# Patient Record
Sex: Female | Born: 1935 | Race: White | Hispanic: No | Smoking: Former smoker
Health system: Southern US, Community
[De-identification: ages and names within clinical notes are randomized; demographics above are authoritative.]

## PROBLEM LIST (undated history)

## (undated) DIAGNOSIS — I739 Peripheral vascular disease, unspecified: Secondary | ICD-10-CM

## (undated) DIAGNOSIS — H3530 Unspecified macular degeneration: Secondary | ICD-10-CM

## (undated) DIAGNOSIS — J449 Chronic obstructive pulmonary disease, unspecified: Secondary | ICD-10-CM

## (undated) DIAGNOSIS — N281 Cyst of kidney, acquired: Secondary | ICD-10-CM

## (undated) DIAGNOSIS — I350 Nonrheumatic aortic (valve) stenosis: Secondary | ICD-10-CM

## (undated) DIAGNOSIS — K589 Irritable bowel syndrome without diarrhea: Secondary | ICD-10-CM

## (undated) DIAGNOSIS — F419 Anxiety disorder, unspecified: Secondary | ICD-10-CM

## (undated) DIAGNOSIS — J329 Chronic sinusitis, unspecified: Secondary | ICD-10-CM

## (undated) DIAGNOSIS — F329 Major depressive disorder, single episode, unspecified: Secondary | ICD-10-CM

## (undated) DIAGNOSIS — R569 Unspecified convulsions: Secondary | ICD-10-CM

## (undated) DIAGNOSIS — E46 Unspecified protein-calorie malnutrition: Secondary | ICD-10-CM

## (undated) DIAGNOSIS — M719 Bursopathy, unspecified: Secondary | ICD-10-CM

## (undated) DIAGNOSIS — B370 Candidal stomatitis: Secondary | ICD-10-CM

## (undated) DIAGNOSIS — G459 Transient cerebral ischemic attack, unspecified: Secondary | ICD-10-CM

## (undated) DIAGNOSIS — R030 Elevated blood-pressure reading, without diagnosis of hypertension: Secondary | ICD-10-CM

## (undated) DIAGNOSIS — K219 Gastro-esophageal reflux disease without esophagitis: Secondary | ICD-10-CM

## (undated) DIAGNOSIS — C801 Malignant (primary) neoplasm, unspecified: Secondary | ICD-10-CM

## (undated) DIAGNOSIS — I209 Angina pectoris, unspecified: Secondary | ICD-10-CM

## (undated) DIAGNOSIS — E1159 Type 2 diabetes mellitus with other circulatory complications: Secondary | ICD-10-CM

## (undated) DIAGNOSIS — D696 Thrombocytopenia, unspecified: Secondary | ICD-10-CM

## (undated) DIAGNOSIS — K5790 Diverticulosis of intestine, part unspecified, without perforation or abscess without bleeding: Secondary | ICD-10-CM

## (undated) DIAGNOSIS — L0390 Cellulitis, unspecified: Secondary | ICD-10-CM

## (undated) DIAGNOSIS — R040 Epistaxis: Secondary | ICD-10-CM

## (undated) DIAGNOSIS — R7989 Other specified abnormal findings of blood chemistry: Secondary | ICD-10-CM

## (undated) DIAGNOSIS — R945 Abnormal results of liver function studies: Secondary | ICD-10-CM

## (undated) DIAGNOSIS — A09 Infectious gastroenteritis and colitis, unspecified: Secondary | ICD-10-CM

## (undated) DIAGNOSIS — M4800 Spinal stenosis, site unspecified: Secondary | ICD-10-CM

## (undated) DIAGNOSIS — J45909 Unspecified asthma, uncomplicated: Secondary | ICD-10-CM

## (undated) DIAGNOSIS — F32A Depression, unspecified: Secondary | ICD-10-CM

## (undated) DIAGNOSIS — G9341 Metabolic encephalopathy: Secondary | ICD-10-CM

## (undated) DIAGNOSIS — A0472 Enterocolitis due to Clostridium difficile, not specified as recurrent: Secondary | ICD-10-CM

## (undated) DIAGNOSIS — K648 Other hemorrhoids: Secondary | ICD-10-CM

## (undated) DIAGNOSIS — K7460 Unspecified cirrhosis of liver: Secondary | ICD-10-CM

## (undated) DIAGNOSIS — D649 Anemia, unspecified: Secondary | ICD-10-CM

## (undated) DIAGNOSIS — G4700 Insomnia, unspecified: Secondary | ICD-10-CM

## (undated) HISTORY — PX: TONSILLECTOMY: SUR1361

## (undated) HISTORY — DX: Thrombocytopenia, unspecified: D69.6

## (undated) HISTORY — DX: Candidal stomatitis: B37.0

## (undated) HISTORY — DX: Infectious gastroenteritis and colitis, unspecified: A09

## (undated) HISTORY — DX: Gastro-esophageal reflux disease without esophagitis: K21.9

## (undated) HISTORY — DX: Irritable bowel syndrome, unspecified: K58.9

## (undated) HISTORY — DX: Depression, unspecified: F32.A

## (undated) HISTORY — DX: Epistaxis: R04.0

## (undated) HISTORY — DX: Spinal stenosis, site unspecified: M48.00

## (undated) HISTORY — DX: Transient cerebral ischemic attack, unspecified: G45.9

## (undated) HISTORY — DX: Nonrheumatic aortic (valve) stenosis: I35.0

## (undated) HISTORY — DX: Malignant (primary) neoplasm, unspecified: C80.1

## (undated) HISTORY — DX: Diverticulosis of intestine, part unspecified, without perforation or abscess without bleeding: K57.90

## (undated) HISTORY — PX: APPENDECTOMY: SHX54

## (undated) HISTORY — DX: Unspecified macular degeneration: H35.30

## (undated) HISTORY — PX: UPPER GASTROINTESTINAL ENDOSCOPY: SHX188

## (undated) HISTORY — PX: CAROTID ENDARTERECTOMY: SUR193

## (undated) HISTORY — DX: Anxiety disorder, unspecified: F41.9

## (undated) HISTORY — DX: Other hemorrhoids: K64.8

## (undated) HISTORY — DX: Chronic obstructive pulmonary disease, unspecified: J44.9

## (undated) HISTORY — DX: Metabolic encephalopathy: G93.41

## (undated) HISTORY — DX: Bursopathy, unspecified: M71.9

## (undated) HISTORY — DX: Enterocolitis due to Clostridium difficile, not specified as recurrent: A04.72

## (undated) HISTORY — DX: Anemia, unspecified: D64.9

## (undated) HISTORY — DX: Unspecified protein-calorie malnutrition: E46

## (undated) HISTORY — DX: Abnormal results of liver function studies: R94.5

## (undated) HISTORY — DX: Other specified abnormal findings of blood chemistry: R79.89

## (undated) HISTORY — DX: Cyst of kidney, acquired: N28.1

## (undated) HISTORY — DX: Peripheral vascular disease, unspecified: I73.9

## (undated) HISTORY — DX: Chronic sinusitis, unspecified: J32.9

## (undated) HISTORY — DX: Type 2 diabetes mellitus with other circulatory complications: E11.59

## (undated) HISTORY — DX: Major depressive disorder, single episode, unspecified: F32.9

## (undated) HISTORY — DX: Cellulitis, unspecified: L03.90

## (undated) HISTORY — DX: Elevated blood-pressure reading, without diagnosis of hypertension: R03.0

## (undated) HISTORY — PX: COLECTOMY: SHX59

## (undated) HISTORY — DX: Insomnia, unspecified: G47.00

---

## 2002-08-21 ENCOUNTER — Observation Stay (HOSPITAL_COMMUNITY): Admission: EM | Admit: 2002-08-21 | Discharge: 2002-08-22 | Payer: Self-pay | Admitting: Emergency Medicine

## 2002-08-21 ENCOUNTER — Encounter: Payer: Self-pay | Admitting: Emergency Medicine

## 2003-02-28 ENCOUNTER — Encounter: Payer: Self-pay | Admitting: Internal Medicine

## 2003-03-15 ENCOUNTER — Encounter: Payer: Self-pay | Admitting: General Surgery

## 2003-03-22 ENCOUNTER — Encounter (INDEPENDENT_AMBULATORY_CARE_PROVIDER_SITE_OTHER): Payer: Self-pay | Admitting: *Deleted

## 2003-03-22 ENCOUNTER — Encounter: Payer: Self-pay | Admitting: General Surgery

## 2003-03-22 ENCOUNTER — Observation Stay (HOSPITAL_COMMUNITY): Admission: RE | Admit: 2003-03-22 | Discharge: 2003-03-23 | Payer: Self-pay | Admitting: General Surgery

## 2004-01-18 ENCOUNTER — Encounter: Admission: RE | Admit: 2004-01-18 | Discharge: 2004-01-18 | Payer: Self-pay | Admitting: Orthopaedic Surgery

## 2004-03-20 ENCOUNTER — Emergency Department (HOSPITAL_COMMUNITY): Admission: EM | Admit: 2004-03-20 | Discharge: 2004-03-20 | Payer: Self-pay | Admitting: Emergency Medicine

## 2004-07-04 ENCOUNTER — Ambulatory Visit (HOSPITAL_COMMUNITY): Admission: RE | Admit: 2004-07-04 | Discharge: 2004-07-04 | Payer: Self-pay | Admitting: Cardiology

## 2004-08-15 ENCOUNTER — Encounter: Admission: RE | Admit: 2004-08-15 | Discharge: 2004-08-15 | Payer: Self-pay | Admitting: Neurology

## 2004-09-08 ENCOUNTER — Ambulatory Visit: Payer: Self-pay | Admitting: Internal Medicine

## 2004-10-10 ENCOUNTER — Ambulatory Visit: Payer: Self-pay | Admitting: Internal Medicine

## 2004-10-29 ENCOUNTER — Ambulatory Visit: Payer: Self-pay | Admitting: Internal Medicine

## 2004-11-04 ENCOUNTER — Ambulatory Visit (HOSPITAL_COMMUNITY): Admission: RE | Admit: 2004-11-04 | Discharge: 2004-11-04 | Payer: Self-pay | Admitting: *Deleted

## 2004-11-25 ENCOUNTER — Ambulatory Visit: Payer: Self-pay | Admitting: Internal Medicine

## 2004-12-05 ENCOUNTER — Ambulatory Visit: Payer: Self-pay | Admitting: Internal Medicine

## 2004-12-26 ENCOUNTER — Ambulatory Visit: Payer: Self-pay | Admitting: Internal Medicine

## 2005-01-09 ENCOUNTER — Ambulatory Visit: Payer: Self-pay | Admitting: Licensed Clinical Social Worker

## 2005-01-23 ENCOUNTER — Ambulatory Visit: Payer: Self-pay | Admitting: Licensed Clinical Social Worker

## 2005-02-05 ENCOUNTER — Ambulatory Visit: Payer: Self-pay | Admitting: Internal Medicine

## 2005-02-09 ENCOUNTER — Ambulatory Visit: Payer: Self-pay | Admitting: Internal Medicine

## 2005-02-23 ENCOUNTER — Ambulatory Visit: Payer: Self-pay | Admitting: Internal Medicine

## 2005-03-03 ENCOUNTER — Emergency Department (HOSPITAL_COMMUNITY): Admission: EM | Admit: 2005-03-03 | Discharge: 2005-03-03 | Payer: Self-pay | Admitting: Emergency Medicine

## 2005-03-03 ENCOUNTER — Ambulatory Visit: Payer: Self-pay | Admitting: Internal Medicine

## 2005-03-13 ENCOUNTER — Ambulatory Visit: Payer: Self-pay | Admitting: Internal Medicine

## 2005-03-23 ENCOUNTER — Ambulatory Visit: Payer: Self-pay | Admitting: Internal Medicine

## 2005-04-15 ENCOUNTER — Ambulatory Visit: Payer: Self-pay | Admitting: Internal Medicine

## 2005-05-06 ENCOUNTER — Ambulatory Visit: Payer: Self-pay | Admitting: Internal Medicine

## 2005-05-11 ENCOUNTER — Ambulatory Visit: Payer: Self-pay | Admitting: Internal Medicine

## 2005-05-20 ENCOUNTER — Ambulatory Visit (HOSPITAL_COMMUNITY): Admission: RE | Admit: 2005-05-20 | Discharge: 2005-05-20 | Payer: Self-pay | Admitting: Internal Medicine

## 2005-05-20 ENCOUNTER — Ambulatory Visit: Payer: Self-pay | Admitting: Internal Medicine

## 2005-06-09 ENCOUNTER — Ambulatory Visit: Payer: Self-pay | Admitting: Internal Medicine

## 2005-06-10 ENCOUNTER — Ambulatory Visit: Payer: Self-pay

## 2005-06-10 ENCOUNTER — Ambulatory Visit: Payer: Self-pay | Admitting: Internal Medicine

## 2005-06-18 ENCOUNTER — Inpatient Hospital Stay (HOSPITAL_COMMUNITY): Admission: RE | Admit: 2005-06-18 | Discharge: 2005-06-19 | Payer: Self-pay | Admitting: *Deleted

## 2005-06-18 ENCOUNTER — Encounter (INDEPENDENT_AMBULATORY_CARE_PROVIDER_SITE_OTHER): Payer: Self-pay | Admitting: *Deleted

## 2005-07-04 ENCOUNTER — Ambulatory Visit: Payer: Self-pay | Admitting: Family Medicine

## 2005-07-06 ENCOUNTER — Emergency Department (HOSPITAL_COMMUNITY): Admission: EM | Admit: 2005-07-06 | Discharge: 2005-07-06 | Payer: Self-pay | Admitting: Emergency Medicine

## 2005-07-14 ENCOUNTER — Ambulatory Visit: Payer: Self-pay | Admitting: Internal Medicine

## 2005-10-01 ENCOUNTER — Ambulatory Visit: Payer: Self-pay | Admitting: Internal Medicine

## 2005-10-09 ENCOUNTER — Ambulatory Visit: Payer: Self-pay | Admitting: Internal Medicine

## 2005-10-12 ENCOUNTER — Ambulatory Visit: Payer: Self-pay | Admitting: Internal Medicine

## 2005-10-26 ENCOUNTER — Ambulatory Visit: Payer: Self-pay | Admitting: Internal Medicine

## 2005-11-06 ENCOUNTER — Emergency Department (HOSPITAL_COMMUNITY): Admission: EM | Admit: 2005-11-06 | Discharge: 2005-11-06 | Payer: Self-pay | Admitting: Emergency Medicine

## 2005-11-18 ENCOUNTER — Ambulatory Visit: Payer: Self-pay | Admitting: Internal Medicine

## 2005-11-19 ENCOUNTER — Ambulatory Visit: Payer: Self-pay | Admitting: Internal Medicine

## 2005-11-25 ENCOUNTER — Ambulatory Visit: Payer: Self-pay | Admitting: Internal Medicine

## 2005-12-22 ENCOUNTER — Ambulatory Visit: Payer: Self-pay | Admitting: Internal Medicine

## 2006-01-07 ENCOUNTER — Ambulatory Visit (HOSPITAL_COMMUNITY): Admission: RE | Admit: 2006-01-07 | Discharge: 2006-01-07 | Payer: Self-pay | Admitting: Gastroenterology

## 2006-01-07 ENCOUNTER — Encounter: Payer: Self-pay | Admitting: Gastroenterology

## 2006-01-11 ENCOUNTER — Ambulatory Visit: Payer: Self-pay | Admitting: Gastroenterology

## 2006-01-27 ENCOUNTER — Ambulatory Visit: Payer: Self-pay | Admitting: Internal Medicine

## 2006-03-15 ENCOUNTER — Ambulatory Visit: Payer: Self-pay | Admitting: Internal Medicine

## 2006-03-18 ENCOUNTER — Ambulatory Visit (HOSPITAL_BASED_OUTPATIENT_CLINIC_OR_DEPARTMENT_OTHER): Admission: RE | Admit: 2006-03-18 | Discharge: 2006-03-18 | Payer: Self-pay | Admitting: Internal Medicine

## 2006-03-21 ENCOUNTER — Ambulatory Visit: Payer: Self-pay | Admitting: Internal Medicine

## 2006-03-31 ENCOUNTER — Ambulatory Visit: Payer: Self-pay | Admitting: Internal Medicine

## 2006-04-02 ENCOUNTER — Ambulatory Visit: Payer: Self-pay | Admitting: Internal Medicine

## 2006-04-22 ENCOUNTER — Ambulatory Visit (HOSPITAL_COMMUNITY): Admission: RE | Admit: 2006-04-22 | Discharge: 2006-04-22 | Payer: Self-pay | Admitting: Gastroenterology

## 2006-04-22 ENCOUNTER — Encounter: Payer: Self-pay | Admitting: Internal Medicine

## 2006-04-22 ENCOUNTER — Encounter: Payer: Self-pay | Admitting: Gastroenterology

## 2006-04-27 ENCOUNTER — Ambulatory Visit: Payer: Self-pay | Admitting: Gastroenterology

## 2006-05-11 ENCOUNTER — Ambulatory Visit: Payer: Self-pay | Admitting: Internal Medicine

## 2006-05-21 ENCOUNTER — Ambulatory Visit: Payer: Self-pay | Admitting: Internal Medicine

## 2006-06-03 ENCOUNTER — Ambulatory Visit: Payer: Self-pay | Admitting: Internal Medicine

## 2006-06-11 ENCOUNTER — Ambulatory Visit: Payer: Self-pay | Admitting: Internal Medicine

## 2006-06-21 ENCOUNTER — Ambulatory Visit: Payer: Self-pay | Admitting: Internal Medicine

## 2006-06-27 ENCOUNTER — Emergency Department (HOSPITAL_COMMUNITY): Admission: EM | Admit: 2006-06-27 | Discharge: 2006-06-27 | Payer: Self-pay | Admitting: Emergency Medicine

## 2006-06-28 ENCOUNTER — Ambulatory Visit: Payer: Self-pay | Admitting: Internal Medicine

## 2006-07-07 ENCOUNTER — Ambulatory Visit: Payer: Self-pay | Admitting: Internal Medicine

## 2006-07-13 ENCOUNTER — Ambulatory Visit: Payer: Self-pay | Admitting: Internal Medicine

## 2006-07-21 ENCOUNTER — Ambulatory Visit: Payer: Self-pay | Admitting: Internal Medicine

## 2006-07-30 ENCOUNTER — Ambulatory Visit: Payer: Self-pay | Admitting: Internal Medicine

## 2006-08-30 ENCOUNTER — Ambulatory Visit: Payer: Self-pay | Admitting: Internal Medicine

## 2006-09-03 ENCOUNTER — Ambulatory Visit: Payer: Self-pay | Admitting: Internal Medicine

## 2006-09-11 ENCOUNTER — Ambulatory Visit: Payer: Self-pay | Admitting: Family Medicine

## 2006-09-13 ENCOUNTER — Ambulatory Visit: Payer: Self-pay | Admitting: Internal Medicine

## 2006-09-17 ENCOUNTER — Ambulatory Visit: Payer: Self-pay | Admitting: Internal Medicine

## 2006-10-01 ENCOUNTER — Ambulatory Visit: Payer: Self-pay | Admitting: Internal Medicine

## 2006-10-05 DIAGNOSIS — C801 Malignant (primary) neoplasm, unspecified: Secondary | ICD-10-CM

## 2006-10-05 HISTORY — DX: Malignant (primary) neoplasm, unspecified: C80.1

## 2006-10-20 ENCOUNTER — Ambulatory Visit: Payer: Self-pay | Admitting: Internal Medicine

## 2006-10-20 ENCOUNTER — Inpatient Hospital Stay (HOSPITAL_COMMUNITY): Admission: EM | Admit: 2006-10-20 | Discharge: 2006-10-20 | Payer: Self-pay | Admitting: Emergency Medicine

## 2006-10-20 ENCOUNTER — Encounter: Payer: Self-pay | Admitting: Cardiology

## 2006-10-25 ENCOUNTER — Ambulatory Visit: Payer: Self-pay | Admitting: Internal Medicine

## 2006-11-24 ENCOUNTER — Ambulatory Visit: Payer: Self-pay | Admitting: Internal Medicine

## 2006-11-24 LAB — CONVERTED CEMR LAB
Basophils Absolute: 0.1 10*3/uL (ref 0.0–0.1)
Eosinophils Relative: 2.7 % (ref 0.0–5.0)
Glucose, Bld: 159 mg/dL — ABNORMAL HIGH (ref 70–99)
HCT: 29.4 % — ABNORMAL LOW (ref 36.0–46.0)
Hemoglobin: 9.5 g/dL — ABNORMAL LOW (ref 12.0–15.0)
Hgb A1c MFr Bld: 7.8 % — ABNORMAL HIGH (ref 4.6–6.0)
MCHC: 32.3 g/dL (ref 30.0–36.0)
MCV: 67 fL — ABNORMAL LOW (ref 78.0–100.0)
Monocytes Absolute: 0.7 10*3/uL (ref 0.2–0.7)
Neutrophils Relative %: 60.4 % (ref 43.0–77.0)
Potassium: 4.4 meq/L (ref 3.5–5.1)
RBC: 4.38 M/uL (ref 3.87–5.11)
RDW: 18.6 % — ABNORMAL HIGH (ref 11.5–14.6)
Saturation Ratios: 5.4 % — ABNORMAL LOW (ref 20.0–50.0)
Sodium: 141 meq/L (ref 135–145)
WBC: 8.7 10*3/uL (ref 4.5–10.5)

## 2006-11-26 ENCOUNTER — Ambulatory Visit: Payer: Self-pay | Admitting: Internal Medicine

## 2006-12-03 ENCOUNTER — Ambulatory Visit: Payer: Self-pay | Admitting: Hematology & Oncology

## 2006-12-15 ENCOUNTER — Ambulatory Visit: Payer: Self-pay | Admitting: Internal Medicine

## 2006-12-23 LAB — CBC & DIFF AND RETIC
BASO%: 0.6 % (ref 0.0–2.0)
EOS%: 1.2 % (ref 0.0–7.0)
IRF: 0.48 — ABNORMAL HIGH (ref 0.130–0.330)
MCH: 21.2 pg — ABNORMAL LOW (ref 26.0–34.0)
MCHC: 32.3 g/dL (ref 32.0–36.0)
MONO#: 0.7 10*3/uL (ref 0.1–0.9)
RBC: 4.48 10*6/uL (ref 3.70–5.32)
RDW: 18 % — ABNORMAL HIGH (ref 11.3–14.5)
Retic %: 1.7 % (ref 0.4–2.3)
WBC: 8 10*3/uL (ref 3.9–10.0)
lymph#: 2.2 10*3/uL (ref 0.9–3.3)

## 2006-12-25 LAB — FERRITIN: Ferritin: 13 ng/mL (ref 10–291)

## 2007-01-04 ENCOUNTER — Ambulatory Visit: Payer: Self-pay | Admitting: Internal Medicine

## 2007-01-12 ENCOUNTER — Encounter (INDEPENDENT_AMBULATORY_CARE_PROVIDER_SITE_OTHER): Payer: Self-pay | Admitting: Specialist

## 2007-01-12 ENCOUNTER — Ambulatory Visit: Payer: Self-pay | Admitting: Internal Medicine

## 2007-01-17 ENCOUNTER — Ambulatory Visit: Payer: Self-pay | Admitting: Internal Medicine

## 2007-01-18 ENCOUNTER — Ambulatory Visit: Payer: Self-pay | Admitting: Hematology & Oncology

## 2007-01-21 ENCOUNTER — Ambulatory Visit (HOSPITAL_COMMUNITY): Admission: RE | Admit: 2007-01-21 | Discharge: 2007-01-21 | Payer: Self-pay | Admitting: Hematology & Oncology

## 2007-01-21 LAB — COMPREHENSIVE METABOLIC PANEL
AST: 70 U/L — ABNORMAL HIGH (ref 0–37)
Albumin: 3.5 g/dL (ref 3.5–5.2)
Alkaline Phosphatase: 76 U/L (ref 39–117)
BUN: 6 mg/dL (ref 6–23)
Calcium: 8.8 mg/dL (ref 8.4–10.5)
Chloride: 102 mEq/L (ref 96–112)
Glucose, Bld: 117 mg/dL — ABNORMAL HIGH (ref 70–99)
Potassium: 3.8 mEq/L (ref 3.5–5.3)
Sodium: 137 mEq/L (ref 135–145)
Total Protein: 6.8 g/dL (ref 6.0–8.3)

## 2007-01-21 LAB — CBC WITH DIFFERENTIAL/PLATELET
Basophils Absolute: 0 10*3/uL (ref 0.0–0.1)
EOS%: 1.6 % (ref 0.0–7.0)
HCT: 36 % (ref 34.8–46.6)
HGB: 12 g/dL (ref 11.6–15.9)
LYMPH%: 32.9 % (ref 14.0–48.0)
MCH: 24.8 pg — ABNORMAL LOW (ref 26.0–34.0)
MCV: 74.6 fL — ABNORMAL LOW (ref 81.0–101.0)
MONO%: 9.7 % (ref 0.0–13.0)
NEUT%: 55.3 % (ref 39.6–76.8)

## 2007-02-10 ENCOUNTER — Inpatient Hospital Stay (HOSPITAL_COMMUNITY): Admission: RE | Admit: 2007-02-10 | Discharge: 2007-02-14 | Payer: Self-pay | Admitting: Surgery

## 2007-02-10 ENCOUNTER — Encounter (INDEPENDENT_AMBULATORY_CARE_PROVIDER_SITE_OTHER): Payer: Self-pay | Admitting: *Deleted

## 2007-02-11 ENCOUNTER — Encounter (INDEPENDENT_AMBULATORY_CARE_PROVIDER_SITE_OTHER): Payer: Self-pay | Admitting: Specialist

## 2007-02-14 ENCOUNTER — Encounter (INDEPENDENT_AMBULATORY_CARE_PROVIDER_SITE_OTHER): Payer: Self-pay | Admitting: *Deleted

## 2007-03-02 ENCOUNTER — Encounter: Payer: Self-pay | Admitting: Endocrinology

## 2007-03-02 DIAGNOSIS — Z8679 Personal history of other diseases of the circulatory system: Secondary | ICD-10-CM | POA: Insufficient documentation

## 2007-03-02 DIAGNOSIS — N281 Cyst of kidney, acquired: Secondary | ICD-10-CM

## 2007-03-02 DIAGNOSIS — F329 Major depressive disorder, single episode, unspecified: Secondary | ICD-10-CM

## 2007-03-02 DIAGNOSIS — F411 Generalized anxiety disorder: Secondary | ICD-10-CM | POA: Insufficient documentation

## 2007-03-02 DIAGNOSIS — C189 Malignant neoplasm of colon, unspecified: Secondary | ICD-10-CM | POA: Insufficient documentation

## 2007-03-02 DIAGNOSIS — I739 Peripheral vascular disease, unspecified: Secondary | ICD-10-CM | POA: Insufficient documentation

## 2007-03-02 DIAGNOSIS — E1159 Type 2 diabetes mellitus with other circulatory complications: Secondary | ICD-10-CM | POA: Insufficient documentation

## 2007-03-02 HISTORY — DX: Type 2 diabetes mellitus with other circulatory complications: E11.59

## 2007-03-04 ENCOUNTER — Ambulatory Visit: Payer: Self-pay | Admitting: Hematology & Oncology

## 2007-03-04 LAB — COMPREHENSIVE METABOLIC PANEL
ALT: 38 U/L — ABNORMAL HIGH (ref 0–35)
AST: 74 U/L — ABNORMAL HIGH (ref 0–37)
Albumin: 4.3 g/dL (ref 3.5–5.2)
Alkaline Phosphatase: 87 U/L (ref 39–117)
Calcium: 9.5 mg/dL (ref 8.4–10.5)
Chloride: 102 mEq/L (ref 96–112)
Potassium: 4.1 mEq/L (ref 3.5–5.3)
Sodium: 138 mEq/L (ref 135–145)
Total Protein: 7.7 g/dL (ref 6.0–8.3)

## 2007-03-04 LAB — CBC WITH DIFFERENTIAL/PLATELET
BASO%: 1.2 % (ref 0.0–2.0)
EOS%: 3.4 % (ref 0.0–7.0)
HGB: 13 g/dL (ref 11.6–15.9)
MCH: 27.8 pg (ref 26.0–34.0)
MCV: 81.9 fL (ref 81.0–101.0)
MONO%: 7.3 % (ref 0.0–13.0)
RBC: 4.68 10*6/uL (ref 3.70–5.32)
RDW: 16.8 % — ABNORMAL HIGH (ref 11.3–14.5)
lymph#: 2.2 10*3/uL (ref 0.9–3.3)

## 2007-03-21 ENCOUNTER — Ambulatory Visit: Payer: Self-pay | Admitting: Internal Medicine

## 2007-04-29 ENCOUNTER — Ambulatory Visit: Payer: Self-pay | Admitting: Internal Medicine

## 2007-04-29 LAB — CONVERTED CEMR LAB
ALT: 52 units/L — ABNORMAL HIGH (ref 0–35)
Albumin: 3.5 g/dL (ref 3.5–5.2)
Basophils Absolute: 0.1 10*3/uL (ref 0.0–0.1)
Bilirubin, Direct: 0.1 mg/dL (ref 0.0–0.3)
Calcium: 9.3 mg/dL (ref 8.4–10.5)
Chloride: 102 meq/L (ref 96–112)
Eosinophils Absolute: 0.2 10*3/uL (ref 0.0–0.6)
GFR calc Af Amer: 105 mL/min
GFR calc non Af Amer: 87 mL/min
Glucose, Bld: 131 mg/dL — ABNORMAL HIGH (ref 70–99)
HCT: 40.9 % (ref 36.0–46.0)
Ketones, ur: NEGATIVE mg/dL
Leukocytes, UA: NEGATIVE
MCHC: 34.3 g/dL (ref 30.0–36.0)
MCV: 81.9 fL (ref 78.0–100.0)
Monocytes Relative: 8 % (ref 3.0–11.0)
Platelets: 257 10*3/uL (ref 150–400)
RBC: 4.99 M/uL (ref 3.87–5.11)
RDW: 13 % (ref 11.5–14.6)
Sodium: 140 meq/L (ref 135–145)
Specific Gravity, Urine: 1.015 (ref 1.000–1.03)
Total Protein, Urine: 30 mg/dL — AB
Vitamin B-12: 374 pg/mL (ref 211–911)
pH: 6 (ref 5.0–8.0)

## 2007-05-20 ENCOUNTER — Ambulatory Visit (HOSPITAL_COMMUNITY): Admission: RE | Admit: 2007-05-20 | Discharge: 2007-05-20 | Payer: Self-pay | Admitting: Hematology & Oncology

## 2007-05-25 ENCOUNTER — Ambulatory Visit: Payer: Self-pay | Admitting: Internal Medicine

## 2007-05-25 LAB — CONVERTED CEMR LAB
ALT: 53 units/L — ABNORMAL HIGH (ref 0–35)
AST: 86 units/L — ABNORMAL HIGH (ref 0–37)
Basophils Relative: 0.9 % (ref 0.0–1.0)
Bilirubin, Direct: 0.1 mg/dL (ref 0.0–0.3)
CO2: 30 meq/L (ref 19–32)
Calcium: 9.8 mg/dL (ref 8.4–10.5)
Chloride: 99 meq/L (ref 96–112)
Creatinine, Ser: 0.7 mg/dL (ref 0.4–1.2)
Eosinophils Relative: 2.2 % (ref 0.0–5.0)
GFR calc non Af Amer: 87 mL/min
Glucose, Bld: 219 mg/dL — ABNORMAL HIGH (ref 70–99)
HCT: 42.8 % (ref 36.0–46.0)
Hep B Core Total Ab: NEGATIVE
Hep B S Ab: NEGATIVE
Hepatitis B Surface Ag: NEGATIVE
Ketones, ur: NEGATIVE mg/dL
Nitrite: NEGATIVE
Platelets: 276 10*3/uL (ref 150–400)
RBC: 5.26 M/uL — ABNORMAL HIGH (ref 3.87–5.11)
RDW: 13.4 % (ref 11.5–14.6)
Specific Gravity, Urine: 1.02 (ref 1.000–1.03)
Total Bilirubin: 0.7 mg/dL (ref 0.3–1.2)
Total Protein, Urine: 100 mg/dL — AB
Urine Glucose: NEGATIVE mg/dL
Urobilinogen, UA: 0.2 (ref 0.0–1.0)
WBC: 10.4 10*3/uL (ref 4.5–10.5)

## 2007-06-01 ENCOUNTER — Ambulatory Visit: Payer: Self-pay | Admitting: Hematology & Oncology

## 2007-06-03 LAB — CBC WITH DIFFERENTIAL/PLATELET
Basophils Absolute: 0 10*3/uL (ref 0.0–0.1)
Eosinophils Absolute: 0.2 10*3/uL (ref 0.0–0.5)
HCT: 39.8 % (ref 34.8–46.6)
HGB: 14.2 g/dL (ref 11.6–15.9)
MCV: 81 fL (ref 81.0–101.0)
NEUT#: 5.5 10*3/uL (ref 1.5–6.5)
RDW: 14.9 % — ABNORMAL HIGH (ref 11.3–14.5)
lymph#: 2.4 10*3/uL (ref 0.9–3.3)

## 2007-06-03 LAB — COMPREHENSIVE METABOLIC PANEL
Albumin: 4 g/dL (ref 3.5–5.2)
BUN: 8 mg/dL (ref 6–23)
CO2: 20 mEq/L (ref 19–32)
Calcium: 8.8 mg/dL (ref 8.4–10.5)
Chloride: 102 mEq/L (ref 96–112)
Glucose, Bld: 315 mg/dL — ABNORMAL HIGH (ref 70–99)
Potassium: 3.9 mEq/L (ref 3.5–5.3)

## 2007-06-03 LAB — CEA: CEA: 4.6 ng/mL (ref 0.0–5.0)

## 2007-06-07 ENCOUNTER — Ambulatory Visit: Payer: Self-pay | Admitting: Internal Medicine

## 2007-06-17 ENCOUNTER — Ambulatory Visit: Payer: Self-pay | Admitting: Internal Medicine

## 2007-06-17 LAB — CONVERTED CEMR LAB
Aldolase: 11.2 units/L — ABNORMAL HIGH (ref <=8.1)
Total CK: 85 units/L (ref 7–177)

## 2007-07-18 ENCOUNTER — Ambulatory Visit: Payer: Self-pay | Admitting: Internal Medicine

## 2007-08-01 ENCOUNTER — Ambulatory Visit: Payer: Self-pay | Admitting: Hematology & Oncology

## 2007-08-03 ENCOUNTER — Encounter: Payer: Self-pay | Admitting: Internal Medicine

## 2007-08-03 LAB — COMPREHENSIVE METABOLIC PANEL
Albumin: 4.3 g/dL (ref 3.5–5.2)
Alkaline Phosphatase: 82 U/L (ref 39–117)
Glucose, Bld: 203 mg/dL — ABNORMAL HIGH (ref 70–99)
Potassium: 4.4 mEq/L (ref 3.5–5.3)
Sodium: 134 mEq/L — ABNORMAL LOW (ref 135–145)
Total Protein: 8 g/dL (ref 6.0–8.3)

## 2007-08-03 LAB — CBC WITH DIFFERENTIAL/PLATELET
Eosinophils Absolute: 0.2 10*3/uL (ref 0.0–0.5)
MCV: 81.3 fL (ref 81.0–101.0)
MONO#: 0.6 10*3/uL (ref 0.1–0.9)
MONO%: 6.7 % (ref 0.0–13.0)
NEUT#: 5.5 10*3/uL (ref 1.5–6.5)
RBC: 5.02 10*6/uL (ref 3.70–5.32)
RDW: 14 % (ref 11.3–14.5)
WBC: 8.6 10*3/uL (ref 3.9–10.0)

## 2007-09-21 ENCOUNTER — Encounter (INDEPENDENT_AMBULATORY_CARE_PROVIDER_SITE_OTHER): Payer: Self-pay | Admitting: *Deleted

## 2007-09-21 ENCOUNTER — Ambulatory Visit (HOSPITAL_COMMUNITY): Admission: RE | Admit: 2007-09-21 | Discharge: 2007-09-21 | Payer: Self-pay | Admitting: Hematology & Oncology

## 2007-09-22 ENCOUNTER — Ambulatory Visit: Payer: Self-pay | Admitting: Hematology & Oncology

## 2007-10-05 DIAGNOSIS — I7090 Unspecified atherosclerosis: Secondary | ICD-10-CM | POA: Insufficient documentation

## 2007-10-05 DIAGNOSIS — J40 Bronchitis, not specified as acute or chronic: Secondary | ICD-10-CM

## 2007-10-05 DIAGNOSIS — G4769 Other sleep related movement disorders: Secondary | ICD-10-CM | POA: Insufficient documentation

## 2007-10-05 DIAGNOSIS — G4700 Insomnia, unspecified: Secondary | ICD-10-CM | POA: Insufficient documentation

## 2007-10-05 DIAGNOSIS — F17200 Nicotine dependence, unspecified, uncomplicated: Secondary | ICD-10-CM

## 2007-10-07 ENCOUNTER — Ambulatory Visit: Payer: Self-pay | Admitting: Internal Medicine

## 2007-10-12 ENCOUNTER — Telehealth: Payer: Self-pay | Admitting: Internal Medicine

## 2007-10-18 ENCOUNTER — Ambulatory Visit: Payer: Self-pay | Admitting: Internal Medicine

## 2007-10-26 LAB — CBC WITH DIFFERENTIAL/PLATELET
BASO%: 0 % (ref 0.0–2.0)
EOS%: 1.5 % (ref 0.0–7.0)
Eosinophils Absolute: 0.1 10*3/uL (ref 0.0–0.5)
MCH: 28.4 pg (ref 26.0–34.0)
MCHC: 34.2 g/dL (ref 32.0–36.0)
MCV: 83 fL (ref 81.0–101.0)
MONO%: 8.1 % (ref 0.0–13.0)
NEUT#: 5.4 10*3/uL (ref 1.5–6.5)
RBC: 5.09 10*6/uL (ref 3.70–5.32)
RDW: 14.5 % (ref 11.3–14.5)

## 2007-10-26 LAB — CEA: CEA: 4.4 ng/mL (ref 0.0–5.0)

## 2007-10-26 LAB — COMPREHENSIVE METABOLIC PANEL
Albumin: 4.1 g/dL (ref 3.5–5.2)
BUN: 7 mg/dL (ref 6–23)
CO2: 24 mEq/L (ref 19–32)
Calcium: 9 mg/dL (ref 8.4–10.5)
Chloride: 102 mEq/L (ref 96–112)
Creatinine, Ser: 0.68 mg/dL (ref 0.40–1.20)

## 2007-11-23 ENCOUNTER — Telehealth: Payer: Self-pay | Admitting: Internal Medicine

## 2007-12-07 DIAGNOSIS — K589 Irritable bowel syndrome without diarrhea: Secondary | ICD-10-CM

## 2008-01-09 ENCOUNTER — Ambulatory Visit: Payer: Self-pay | Admitting: Internal Medicine

## 2008-01-17 ENCOUNTER — Ambulatory Visit: Payer: Self-pay | Admitting: Internal Medicine

## 2008-01-17 DIAGNOSIS — M25519 Pain in unspecified shoulder: Secondary | ICD-10-CM | POA: Insufficient documentation

## 2008-01-25 ENCOUNTER — Ambulatory Visit: Payer: Self-pay | Admitting: Hematology & Oncology

## 2008-01-27 ENCOUNTER — Ambulatory Visit (HOSPITAL_COMMUNITY): Admission: RE | Admit: 2008-01-27 | Discharge: 2008-01-27 | Payer: Self-pay | Admitting: Hematology & Oncology

## 2008-01-27 ENCOUNTER — Encounter (INDEPENDENT_AMBULATORY_CARE_PROVIDER_SITE_OTHER): Payer: Self-pay | Admitting: *Deleted

## 2008-01-27 LAB — COMPREHENSIVE METABOLIC PANEL WITH GFR
ALT: 49 U/L — ABNORMAL HIGH (ref 0–35)
AST: 151 U/L — ABNORMAL HIGH (ref 0–37)
Albumin: 3.6 g/dL (ref 3.5–5.2)
Alkaline Phosphatase: 81 U/L (ref 39–117)
BUN: 6 mg/dL (ref 6–23)
CO2: 28 meq/L (ref 19–32)
Calcium: 9.7 mg/dL (ref 8.4–10.5)
Chloride: 101 meq/L (ref 96–112)
Creatinine, Ser: 0.71 mg/dL (ref 0.40–1.20)
Glucose, Bld: 91 mg/dL (ref 70–99)
Potassium: 3.9 meq/L (ref 3.5–5.3)
Sodium: 138 meq/L (ref 135–145)
Total Bilirubin: 0.8 mg/dL (ref 0.3–1.2)
Total Protein: 8.9 g/dL — ABNORMAL HIGH (ref 6.0–8.3)

## 2008-01-27 LAB — CBC WITH DIFFERENTIAL/PLATELET
BASO%: 0.7 % (ref 0.0–2.0)
EOS%: 1.2 % (ref 0.0–7.0)
HGB: 14.4 g/dL (ref 11.6–15.9)
MCH: 28.6 pg (ref 26.0–34.0)
MCHC: 34.6 g/dL (ref 32.0–36.0)
RBC: 5.06 10*6/uL (ref 3.70–5.32)
RDW: 14.1 % (ref 11.3–14.5)
lymph#: 3.6 10*3/uL — ABNORMAL HIGH (ref 0.9–3.3)

## 2008-01-27 LAB — CEA: CEA: 5.6 ng/mL — ABNORMAL HIGH (ref 0.0–5.0)

## 2008-02-01 ENCOUNTER — Encounter: Payer: Self-pay | Admitting: Internal Medicine

## 2008-02-01 LAB — CBC WITH DIFFERENTIAL/PLATELET
BASO%: 0.1 % (ref 0.0–2.0)
HCT: 39.3 % (ref 34.8–46.6)
MCHC: 34.7 g/dL (ref 32.0–36.0)
MONO#: 0.7 10*3/uL (ref 0.1–0.9)
NEUT%: 66.6 % (ref 39.6–76.8)
RDW: 14.5 % (ref 11.3–14.5)
WBC: 10.1 10*3/uL — ABNORMAL HIGH (ref 3.9–10.0)
lymph#: 2.5 10*3/uL (ref 0.9–3.3)

## 2008-02-01 LAB — COMPREHENSIVE METABOLIC PANEL
ALT: 31 U/L (ref 0–35)
Albumin: 3.9 g/dL (ref 3.5–5.2)
CO2: 23 mEq/L (ref 19–32)
Calcium: 9.4 mg/dL (ref 8.4–10.5)
Chloride: 102 mEq/L (ref 96–112)
Creatinine, Ser: 0.73 mg/dL (ref 0.40–1.20)
Potassium: 4 mEq/L (ref 3.5–5.3)
Sodium: 139 mEq/L (ref 135–145)
Total Protein: 8 g/dL (ref 6.0–8.3)

## 2008-02-09 ENCOUNTER — Ambulatory Visit: Payer: Self-pay | Admitting: Internal Medicine

## 2008-02-24 ENCOUNTER — Telehealth: Payer: Self-pay | Admitting: Internal Medicine

## 2008-03-07 ENCOUNTER — Ambulatory Visit: Payer: Self-pay | Admitting: Internal Medicine

## 2008-03-07 DIAGNOSIS — K219 Gastro-esophageal reflux disease without esophagitis: Secondary | ICD-10-CM

## 2008-03-12 ENCOUNTER — Telehealth: Payer: Self-pay | Admitting: Internal Medicine

## 2008-03-13 ENCOUNTER — Ambulatory Visit: Payer: Self-pay | Admitting: Internal Medicine

## 2008-03-13 ENCOUNTER — Encounter: Payer: Self-pay | Admitting: Internal Medicine

## 2008-03-14 ENCOUNTER — Telehealth: Payer: Self-pay | Admitting: Internal Medicine

## 2008-03-14 ENCOUNTER — Encounter: Payer: Self-pay | Admitting: Internal Medicine

## 2008-03-26 ENCOUNTER — Ambulatory Visit: Payer: Self-pay | Admitting: Internal Medicine

## 2008-03-26 DIAGNOSIS — R5383 Other fatigue: Secondary | ICD-10-CM

## 2008-03-26 DIAGNOSIS — R5381 Other malaise: Secondary | ICD-10-CM

## 2008-03-26 DIAGNOSIS — B370 Candidal stomatitis: Secondary | ICD-10-CM

## 2008-04-03 ENCOUNTER — Encounter: Payer: Self-pay | Admitting: Internal Medicine

## 2008-04-03 DIAGNOSIS — R945 Abnormal results of liver function studies: Secondary | ICD-10-CM

## 2008-04-05 ENCOUNTER — Telehealth (INDEPENDENT_AMBULATORY_CARE_PROVIDER_SITE_OTHER): Payer: Self-pay | Admitting: *Deleted

## 2008-04-05 LAB — CONVERTED CEMR LAB
AST: 125 units/L — ABNORMAL HIGH (ref 0–37)
Alkaline Phosphatase: 87 units/L (ref 39–117)
Basophils Absolute: 0 10*3/uL (ref 0.0–0.1)
Bilirubin, Direct: 0.2 mg/dL (ref 0.0–0.3)
Chloride: 102 meq/L (ref 96–112)
Eosinophils Absolute: 0.2 10*3/uL (ref 0.0–0.7)
Eosinophils Relative: 1.8 % (ref 0.0–5.0)
GFR calc non Af Amer: 86 mL/min
MCHC: 34.3 g/dL (ref 30.0–36.0)
MCV: 83.8 fL (ref 78.0–100.0)
Neutrophils Relative %: 60.8 % (ref 43.0–77.0)
Platelets: 251 10*3/uL (ref 150–400)
Potassium: 4 meq/L (ref 3.5–5.1)
TSH: 1.29 microintl units/mL (ref 0.35–5.50)
Total Bilirubin: 0.6 mg/dL (ref 0.3–1.2)
WBC: 9.9 10*3/uL (ref 4.5–10.5)

## 2008-04-09 ENCOUNTER — Encounter (INDEPENDENT_AMBULATORY_CARE_PROVIDER_SITE_OTHER): Payer: Self-pay | Admitting: *Deleted

## 2008-04-09 ENCOUNTER — Encounter: Admission: RE | Admit: 2008-04-09 | Discharge: 2008-04-09 | Payer: Self-pay | Admitting: Internal Medicine

## 2008-04-19 ENCOUNTER — Encounter: Payer: Self-pay | Admitting: Internal Medicine

## 2008-05-01 ENCOUNTER — Encounter: Payer: Self-pay | Admitting: Internal Medicine

## 2008-05-03 ENCOUNTER — Telehealth: Payer: Self-pay | Admitting: Internal Medicine

## 2008-05-04 ENCOUNTER — Encounter: Payer: Self-pay | Admitting: Internal Medicine

## 2008-05-11 ENCOUNTER — Telehealth (INDEPENDENT_AMBULATORY_CARE_PROVIDER_SITE_OTHER): Payer: Self-pay | Admitting: *Deleted

## 2008-05-11 ENCOUNTER — Encounter: Payer: Self-pay | Admitting: Internal Medicine

## 2008-05-17 ENCOUNTER — Encounter: Payer: Self-pay | Admitting: Internal Medicine

## 2008-05-20 ENCOUNTER — Ambulatory Visit: Payer: Self-pay | Admitting: Hematology & Oncology

## 2008-05-29 ENCOUNTER — Ambulatory Visit: Payer: Self-pay | Admitting: Internal Medicine

## 2008-05-29 ENCOUNTER — Ambulatory Visit: Payer: Self-pay | Admitting: Hematology & Oncology

## 2008-05-29 DIAGNOSIS — J449 Chronic obstructive pulmonary disease, unspecified: Secondary | ICD-10-CM | POA: Insufficient documentation

## 2008-06-04 ENCOUNTER — Ambulatory Visit: Payer: Self-pay | Admitting: Internal Medicine

## 2008-06-05 LAB — CONVERTED CEMR LAB
Bilirubin, Direct: 0.2 mg/dL (ref 0.0–0.3)
Crystals: NEGATIVE
Leukocytes, UA: NEGATIVE
RBC / HPF: NONE SEEN
Specific Gravity, Urine: 1.01 (ref 1.000–1.03)
Total Bilirubin: 0.8 mg/dL (ref 0.3–1.2)
Urine Glucose: NEGATIVE mg/dL
Urobilinogen, UA: 0.2 (ref 0.0–1.0)

## 2008-06-15 ENCOUNTER — Ambulatory Visit (HOSPITAL_BASED_OUTPATIENT_CLINIC_OR_DEPARTMENT_OTHER): Admission: RE | Admit: 2008-06-15 | Discharge: 2008-06-15 | Payer: Self-pay | Admitting: Hematology & Oncology

## 2008-06-15 ENCOUNTER — Encounter (INDEPENDENT_AMBULATORY_CARE_PROVIDER_SITE_OTHER): Payer: Self-pay | Admitting: *Deleted

## 2008-06-15 LAB — CMP (CANCER CENTER ONLY)
ALT(SGPT): 57 U/L — ABNORMAL HIGH (ref 10–47)
AST: 112 U/L — ABNORMAL HIGH (ref 11–38)
Albumin: 3.5 g/dL (ref 3.3–5.5)
BUN, Bld: 8 mg/dL (ref 7–22)
Calcium: 10 mg/dL (ref 8.0–10.3)
Chloride: 97 mEq/L — ABNORMAL LOW (ref 98–108)
Potassium: 3.9 mEq/L (ref 3.3–4.7)
Total Protein: 8.9 g/dL — ABNORMAL HIGH (ref 6.4–8.1)

## 2008-06-15 LAB — CBC WITH DIFFERENTIAL (CANCER CENTER ONLY)
BASO%: 0.9 % (ref 0.0–2.0)
LYMPH%: 23.6 % (ref 14.0–48.0)
MCV: 84 fL (ref 81–101)
MONO#: 0.7 10*3/uL (ref 0.1–0.9)
Platelets: 209 10*3/uL (ref 145–400)
RDW: 13.1 % (ref 10.5–14.6)
WBC: 9 10*3/uL (ref 3.9–10.0)

## 2008-07-06 ENCOUNTER — Encounter: Payer: Self-pay | Admitting: Internal Medicine

## 2008-08-02 ENCOUNTER — Telehealth: Payer: Self-pay | Admitting: Internal Medicine

## 2008-08-05 HISTORY — PX: CHOLECYSTECTOMY: SHX55

## 2008-08-15 ENCOUNTER — Telehealth (INDEPENDENT_AMBULATORY_CARE_PROVIDER_SITE_OTHER): Payer: Self-pay | Admitting: *Deleted

## 2008-08-17 ENCOUNTER — Ambulatory Visit: Payer: Self-pay | Admitting: Internal Medicine

## 2008-08-28 ENCOUNTER — Encounter: Admission: RE | Admit: 2008-08-28 | Discharge: 2008-08-28 | Payer: Self-pay | Admitting: Obstetrics and Gynecology

## 2008-09-06 ENCOUNTER — Ambulatory Visit: Payer: Self-pay | Admitting: Diagnostic Radiology

## 2008-09-06 ENCOUNTER — Emergency Department (HOSPITAL_BASED_OUTPATIENT_CLINIC_OR_DEPARTMENT_OTHER): Admission: EM | Admit: 2008-09-06 | Discharge: 2008-09-06 | Payer: Self-pay | Admitting: Emergency Medicine

## 2008-09-06 ENCOUNTER — Ambulatory Visit: Payer: Self-pay | Admitting: Radiology

## 2008-09-06 ENCOUNTER — Encounter: Payer: Self-pay | Admitting: Internal Medicine

## 2008-09-06 ENCOUNTER — Telehealth (INDEPENDENT_AMBULATORY_CARE_PROVIDER_SITE_OTHER): Payer: Self-pay | Admitting: *Deleted

## 2008-09-10 ENCOUNTER — Ambulatory Visit: Payer: Self-pay | Admitting: Internal Medicine

## 2008-09-10 DIAGNOSIS — J069 Acute upper respiratory infection, unspecified: Secondary | ICD-10-CM | POA: Insufficient documentation

## 2008-09-10 DIAGNOSIS — M542 Cervicalgia: Secondary | ICD-10-CM | POA: Insufficient documentation

## 2008-09-17 ENCOUNTER — Ambulatory Visit: Payer: Self-pay | Admitting: Internal Medicine

## 2008-09-26 ENCOUNTER — Telehealth (INDEPENDENT_AMBULATORY_CARE_PROVIDER_SITE_OTHER): Payer: Self-pay | Admitting: *Deleted

## 2008-10-01 ENCOUNTER — Telehealth: Payer: Self-pay | Admitting: Internal Medicine

## 2008-10-02 ENCOUNTER — Ambulatory Visit: Payer: Self-pay | Admitting: Internal Medicine

## 2008-10-05 ENCOUNTER — Ambulatory Visit: Payer: Self-pay | Admitting: Interventional Radiology

## 2008-10-05 ENCOUNTER — Emergency Department (HOSPITAL_BASED_OUTPATIENT_CLINIC_OR_DEPARTMENT_OTHER): Admission: EM | Admit: 2008-10-05 | Discharge: 2008-10-05 | Payer: Self-pay | Admitting: Emergency Medicine

## 2008-10-08 ENCOUNTER — Ambulatory Visit: Payer: Self-pay | Admitting: Internal Medicine

## 2008-10-08 DIAGNOSIS — J019 Acute sinusitis, unspecified: Secondary | ICD-10-CM

## 2008-10-08 DIAGNOSIS — F29 Unspecified psychosis not due to a substance or known physiological condition: Secondary | ICD-10-CM | POA: Insufficient documentation

## 2008-10-08 DIAGNOSIS — G40309 Generalized idiopathic epilepsy and epileptic syndromes, not intractable, without status epilepticus: Secondary | ICD-10-CM

## 2008-10-08 DIAGNOSIS — M25529 Pain in unspecified elbow: Secondary | ICD-10-CM

## 2008-10-10 ENCOUNTER — Telehealth: Payer: Self-pay | Admitting: Internal Medicine

## 2008-11-02 ENCOUNTER — Encounter: Payer: Self-pay | Admitting: Internal Medicine

## 2008-11-08 ENCOUNTER — Ambulatory Visit: Payer: Self-pay | Admitting: Internal Medicine

## 2008-11-13 LAB — CONVERTED CEMR LAB
AST: 45 units/L — ABNORMAL HIGH (ref 0–37)
Albumin: 3.5 g/dL (ref 3.5–5.2)
BUN: 7 mg/dL (ref 6–23)
Cholesterol: 139 mg/dL (ref 0–200)
Creatinine, Ser: 0.8 mg/dL (ref 0.4–1.2)
GFR calc Af Amer: 89 mL/min
GFR calc non Af Amer: 74 mL/min
Hgb A1c MFr Bld: 6 % (ref 4.6–6.0)
LDL Cholesterol: 92 mg/dL (ref 0–99)
Triglycerides: 138 mg/dL (ref 0–149)
VLDL: 28 mg/dL (ref 0–40)

## 2008-11-21 ENCOUNTER — Telehealth: Payer: Self-pay | Admitting: Internal Medicine

## 2008-12-21 ENCOUNTER — Encounter: Payer: Self-pay | Admitting: Internal Medicine

## 2008-12-25 ENCOUNTER — Ambulatory Visit: Payer: Self-pay | Admitting: Internal Medicine

## 2008-12-25 DIAGNOSIS — R569 Unspecified convulsions: Secondary | ICD-10-CM | POA: Insufficient documentation

## 2009-01-04 ENCOUNTER — Ambulatory Visit: Payer: Self-pay | Admitting: Hematology & Oncology

## 2009-01-07 LAB — CBC WITH DIFFERENTIAL (CANCER CENTER ONLY)
BASO%: 0.7 % (ref 0.0–2.0)
Eosinophils Absolute: 0.3 10*3/uL (ref 0.0–0.5)
LYMPH%: 30.7 % (ref 14.0–48.0)
MCH: 27.3 pg (ref 26.0–34.0)
MCHC: 32.7 g/dL (ref 32.0–36.0)
MCV: 83 fL (ref 81–101)
MONO%: 6.9 % (ref 0.0–13.0)
Platelets: 205 10*3/uL (ref 145–400)
RDW: 13.1 % (ref 10.5–14.6)

## 2009-01-07 LAB — COMPREHENSIVE METABOLIC PANEL
AST: 60 U/L — ABNORMAL HIGH (ref 0–37)
BUN: 9 mg/dL (ref 6–23)
Calcium: 8.8 mg/dL (ref 8.4–10.5)
Chloride: 106 mEq/L (ref 96–112)
Creatinine, Ser: 0.86 mg/dL (ref 0.40–1.20)

## 2009-01-08 ENCOUNTER — Ambulatory Visit (HOSPITAL_BASED_OUTPATIENT_CLINIC_OR_DEPARTMENT_OTHER): Admission: RE | Admit: 2009-01-08 | Discharge: 2009-01-08 | Payer: Self-pay | Admitting: Hematology & Oncology

## 2009-01-08 ENCOUNTER — Encounter (INDEPENDENT_AMBULATORY_CARE_PROVIDER_SITE_OTHER): Payer: Self-pay | Admitting: *Deleted

## 2009-01-08 ENCOUNTER — Ambulatory Visit: Payer: Self-pay | Admitting: Diagnostic Radiology

## 2009-01-11 ENCOUNTER — Encounter: Payer: Self-pay | Admitting: Internal Medicine

## 2009-02-04 ENCOUNTER — Ambulatory Visit: Payer: Self-pay | Admitting: Endocrinology

## 2009-02-04 DIAGNOSIS — R300 Dysuria: Secondary | ICD-10-CM | POA: Insufficient documentation

## 2009-02-04 LAB — CONVERTED CEMR LAB
Specific Gravity, Urine: 1.015
Urobilinogen, UA: 0.2
pH: 6

## 2009-02-05 ENCOUNTER — Encounter: Payer: Self-pay | Admitting: Endocrinology

## 2009-02-13 ENCOUNTER — Ambulatory Visit: Payer: Self-pay | Admitting: Internal Medicine

## 2009-02-13 DIAGNOSIS — N649 Disorder of breast, unspecified: Secondary | ICD-10-CM | POA: Insufficient documentation

## 2009-02-25 ENCOUNTER — Telehealth: Payer: Self-pay | Admitting: Internal Medicine

## 2009-02-27 ENCOUNTER — Telehealth: Payer: Self-pay | Admitting: Internal Medicine

## 2009-02-28 ENCOUNTER — Ambulatory Visit: Payer: Self-pay | Admitting: Gastroenterology

## 2009-02-28 DIAGNOSIS — R143 Flatulence: Secondary | ICD-10-CM

## 2009-02-28 DIAGNOSIS — Z860100 Personal history of colon polyps, unspecified: Secondary | ICD-10-CM | POA: Insufficient documentation

## 2009-02-28 DIAGNOSIS — A09 Infectious gastroenteritis and colitis, unspecified: Secondary | ICD-10-CM | POA: Insufficient documentation

## 2009-02-28 DIAGNOSIS — R141 Gas pain: Secondary | ICD-10-CM

## 2009-02-28 DIAGNOSIS — R142 Eructation: Secondary | ICD-10-CM

## 2009-02-28 DIAGNOSIS — Z85038 Personal history of other malignant neoplasm of large intestine: Secondary | ICD-10-CM

## 2009-02-28 DIAGNOSIS — K5730 Diverticulosis of large intestine without perforation or abscess without bleeding: Secondary | ICD-10-CM | POA: Insufficient documentation

## 2009-02-28 DIAGNOSIS — R197 Diarrhea, unspecified: Secondary | ICD-10-CM

## 2009-03-01 LAB — CONVERTED CEMR LAB
Basophils Absolute: 0.3 10*3/uL — ABNORMAL HIGH (ref 0.0–0.1)
Calcium: 8.6 mg/dL (ref 8.4–10.5)
Eosinophils Absolute: 0.2 10*3/uL (ref 0.0–0.7)
Eosinophils Relative: 1.5 % (ref 0.0–5.0)
GFR calc non Af Amer: 73.83 mL/min (ref >60)
MCHC: 34.8 g/dL (ref 30.0–36.0)
MCV: 80.2 fL (ref 78.0–100.0)
Monocytes Absolute: 1.4 10*3/uL — ABNORMAL HIGH (ref 0.1–1.0)
Neutrophils Relative %: 60.2 % (ref 43.0–77.0)
Platelets: 148 10*3/uL — ABNORMAL LOW (ref 150.0–400.0)
Potassium: 4.2 meq/L (ref 3.5–5.1)
RDW: 14.3 % (ref 11.5–14.6)
Sodium: 139 meq/L (ref 135–145)
WBC: 11.7 10*3/uL — ABNORMAL HIGH (ref 4.5–10.5)

## 2009-03-05 ENCOUNTER — Telehealth: Payer: Self-pay | Admitting: Physician Assistant

## 2009-03-06 ENCOUNTER — Encounter: Payer: Self-pay | Admitting: Physician Assistant

## 2009-03-08 ENCOUNTER — Ambulatory Visit: Payer: Self-pay | Admitting: Internal Medicine

## 2009-03-08 DIAGNOSIS — K7460 Unspecified cirrhosis of liver: Secondary | ICD-10-CM | POA: Insufficient documentation

## 2009-03-18 ENCOUNTER — Telehealth: Payer: Self-pay | Admitting: Internal Medicine

## 2009-03-18 ENCOUNTER — Ambulatory Visit: Payer: Self-pay | Admitting: Internal Medicine

## 2009-03-20 ENCOUNTER — Ambulatory Visit: Payer: Self-pay | Admitting: Internal Medicine

## 2009-03-28 ENCOUNTER — Telehealth: Payer: Self-pay | Admitting: Internal Medicine

## 2009-04-03 ENCOUNTER — Ambulatory Visit: Payer: Self-pay | Admitting: Internal Medicine

## 2009-04-22 ENCOUNTER — Telehealth: Payer: Self-pay | Admitting: Internal Medicine

## 2009-05-26 ENCOUNTER — Encounter: Payer: Self-pay | Admitting: Internal Medicine

## 2009-06-14 ENCOUNTER — Ambulatory Visit: Payer: Self-pay | Admitting: Internal Medicine

## 2009-06-17 LAB — CONVERTED CEMR LAB
ALT: 38 units/L — ABNORMAL HIGH (ref 0–35)
Alkaline Phosphatase: 95 units/L (ref 39–117)
Basophils Relative: 0.1 % (ref 0.0–3.0)
Bilirubin, Direct: 0.1 mg/dL (ref 0.0–0.3)
CO2: 28 meq/L (ref 19–32)
Chloride: 106 meq/L (ref 96–112)
Eosinophils Absolute: 0.2 10*3/uL (ref 0.0–0.7)
HCT: 35.1 % — ABNORMAL LOW (ref 36.0–46.0)
Lymphs Abs: 1.5 10*3/uL (ref 0.7–4.0)
MCHC: 33.5 g/dL (ref 30.0–36.0)
MCV: 81.2 fL (ref 78.0–100.0)
Monocytes Absolute: 0.6 10*3/uL (ref 0.1–1.0)
Neutrophils Relative %: 65.5 % (ref 43.0–77.0)
Platelets: 133 10*3/uL — ABNORMAL LOW (ref 150.0–400.0)
Potassium: 4 meq/L (ref 3.5–5.1)
Sodium: 141 meq/L (ref 135–145)
Total Bilirubin: 0.6 mg/dL (ref 0.3–1.2)
Total Protein: 7.5 g/dL (ref 6.0–8.3)

## 2009-06-19 ENCOUNTER — Ambulatory Visit: Payer: Self-pay | Admitting: Internal Medicine

## 2009-06-19 LAB — CONVERTED CEMR LAB
Bilirubin Urine: NEGATIVE
Leukocytes, UA: NEGATIVE
Nitrite: NEGATIVE
Pro B Natriuretic peptide (BNP): 58 pg/mL (ref 0.0–100.0)
Specific Gravity, Urine: 1.015 (ref 1.000–1.030)
Total CK: 110 units/L (ref 7–177)
Total Protein, Urine: 100 mg/dL
pH: 6 (ref 5.0–8.0)

## 2009-06-20 ENCOUNTER — Telehealth: Payer: Self-pay | Admitting: Internal Medicine

## 2009-06-20 ENCOUNTER — Ambulatory Visit: Payer: Self-pay | Admitting: Hematology & Oncology

## 2009-06-21 ENCOUNTER — Encounter: Payer: Self-pay | Admitting: Internal Medicine

## 2009-06-21 ENCOUNTER — Ambulatory Visit: Payer: Self-pay | Admitting: Diagnostic Radiology

## 2009-06-21 ENCOUNTER — Ambulatory Visit (HOSPITAL_BASED_OUTPATIENT_CLINIC_OR_DEPARTMENT_OTHER): Admission: RE | Admit: 2009-06-21 | Discharge: 2009-06-21 | Payer: Self-pay | Admitting: Hematology & Oncology

## 2009-06-21 LAB — CBC WITH DIFFERENTIAL (CANCER CENTER ONLY)
BASO%: 0.7 % (ref 0.0–2.0)
Eosinophils Absolute: 0.3 10*3/uL (ref 0.0–0.5)
LYMPH#: 2 10*3/uL (ref 0.9–3.3)
LYMPH%: 27.1 % (ref 14.0–48.0)
MCV: 80 fL — ABNORMAL LOW (ref 81–101)
MONO#: 0.6 10*3/uL (ref 0.1–0.9)
Platelets: 190 10*3/uL (ref 145–400)
RBC: 4.75 10*6/uL (ref 3.70–5.32)
RDW: 13.1 % (ref 10.5–14.6)
WBC: 7.4 10*3/uL (ref 3.9–10.0)

## 2009-06-21 LAB — CMP (CANCER CENTER ONLY)
ALT(SGPT): 32 U/L (ref 10–47)
Albumin: 3.4 g/dL (ref 3.3–5.5)
CO2: 28 mEq/L (ref 18–33)
Calcium: 10 mg/dL (ref 8.0–10.3)
Chloride: 103 mEq/L (ref 98–108)
Glucose, Bld: 175 mg/dL — ABNORMAL HIGH (ref 73–118)
Potassium: 4.1 mEq/L (ref 3.3–4.7)
Sodium: 142 mEq/L (ref 128–145)
Total Protein: 8.7 g/dL — ABNORMAL HIGH (ref 6.4–8.1)

## 2009-06-21 LAB — CEA: CEA: 4.3 ng/mL (ref 0.0–5.0)

## 2009-06-24 ENCOUNTER — Telehealth: Payer: Self-pay | Admitting: Internal Medicine

## 2009-06-27 ENCOUNTER — Encounter: Payer: Self-pay | Admitting: Internal Medicine

## 2009-07-16 ENCOUNTER — Telehealth (INDEPENDENT_AMBULATORY_CARE_PROVIDER_SITE_OTHER): Payer: Self-pay | Admitting: *Deleted

## 2009-07-19 ENCOUNTER — Ambulatory Visit: Payer: Self-pay | Admitting: Internal Medicine

## 2009-07-29 ENCOUNTER — Telehealth: Payer: Self-pay | Admitting: Internal Medicine

## 2009-07-31 ENCOUNTER — Ambulatory Visit: Payer: Self-pay | Admitting: Internal Medicine

## 2009-07-31 DIAGNOSIS — R933 Abnormal findings on diagnostic imaging of other parts of digestive tract: Secondary | ICD-10-CM | POA: Insufficient documentation

## 2009-07-31 LAB — CONVERTED CEMR LAB
ALT: 38 units/L — ABNORMAL HIGH (ref 0–35)
BUN: 8 mg/dL (ref 6–23)
Basophils Relative: 1 % (ref 0.0–3.0)
Bilirubin, Direct: 0.1 mg/dL (ref 0.0–0.3)
Calcium: 9.4 mg/dL (ref 8.4–10.5)
Chloride: 104 meq/L (ref 96–112)
Creatinine, Ser: 0.7 mg/dL (ref 0.4–1.2)
Eosinophils Absolute: 0.2 10*3/uL (ref 0.0–0.7)
Eosinophils Relative: 2.9 % (ref 0.0–5.0)
GFR calc non Af Amer: 86.03 mL/min (ref >60)
HCT: 38.5 % (ref 36.0–46.0)
HCV Ab: NEGATIVE
Hemoglobin: 13 g/dL (ref 12.0–15.0)
Hepatitis B Surface Ag: NEGATIVE
INR: 1.3 — ABNORMAL HIGH (ref 0.8–1.0)
Iron: 58 ug/dL (ref 42–145)
MCHC: 33.8 g/dL (ref 30.0–36.0)
MCV: 81.9 fL (ref 78.0–100.0)
Monocytes Absolute: 0.6 10*3/uL (ref 0.1–1.0)
Neutro Abs: 4.2 10*3/uL (ref 1.4–7.7)
Neutrophils Relative %: 59.6 % (ref 43.0–77.0)
Prothrombin Time: 13.6 s — ABNORMAL HIGH (ref 9.1–11.7)
RBC: 4.7 M/uL (ref 3.87–5.11)
Total Bilirubin: 0.5 mg/dL (ref 0.3–1.2)
WBC: 7 10*3/uL (ref 4.5–10.5)

## 2009-09-02 ENCOUNTER — Telehealth: Payer: Self-pay | Admitting: Internal Medicine

## 2009-09-03 ENCOUNTER — Ambulatory Visit: Payer: Self-pay | Admitting: Internal Medicine

## 2009-09-06 ENCOUNTER — Telehealth: Payer: Self-pay | Admitting: Internal Medicine

## 2009-09-11 ENCOUNTER — Ambulatory Visit: Payer: Self-pay | Admitting: Internal Medicine

## 2009-09-11 ENCOUNTER — Telehealth: Payer: Self-pay | Admitting: Internal Medicine

## 2009-09-12 ENCOUNTER — Ambulatory Visit: Payer: Self-pay | Admitting: Hematology & Oncology

## 2009-09-13 ENCOUNTER — Encounter: Payer: Self-pay | Admitting: Internal Medicine

## 2009-09-13 LAB — CBC WITH DIFFERENTIAL (CANCER CENTER ONLY)
BASO%: 0.4 % (ref 0.0–2.0)
Eosinophils Absolute: 0.2 10*3/uL (ref 0.0–0.5)
MCH: 26.4 pg (ref 26.0–34.0)
MONO#: 0.5 10*3/uL (ref 0.1–0.9)
MONO%: 7.9 % (ref 0.0–13.0)
NEUT#: 3.9 10*3/uL (ref 1.5–6.5)
Platelets: 179 10*3/uL (ref 145–400)
RBC: 4.81 10*6/uL (ref 3.70–5.32)
RDW: 14.3 % (ref 10.5–14.6)
WBC: 6.5 10*3/uL (ref 3.9–10.0)

## 2009-09-13 LAB — COMPREHENSIVE METABOLIC PANEL
ALT: 34 U/L (ref 0–35)
BUN: 9 mg/dL (ref 6–23)
CO2: 26 mEq/L (ref 19–32)
Calcium: 9.2 mg/dL (ref 8.4–10.5)
Chloride: 99 mEq/L (ref 96–112)
Creatinine, Ser: 0.72 mg/dL (ref 0.40–1.20)
Total Bilirubin: 0.3 mg/dL (ref 0.3–1.2)

## 2009-09-13 LAB — CEA: CEA: 4.9 ng/mL (ref 0.0–5.0)

## 2009-10-09 ENCOUNTER — Ambulatory Visit: Payer: Self-pay | Admitting: Internal Medicine

## 2009-10-09 DIAGNOSIS — N644 Mastodynia: Secondary | ICD-10-CM

## 2009-10-11 ENCOUNTER — Telehealth: Payer: Self-pay | Admitting: Internal Medicine

## 2009-10-23 ENCOUNTER — Emergency Department (HOSPITAL_BASED_OUTPATIENT_CLINIC_OR_DEPARTMENT_OTHER): Admission: EM | Admit: 2009-10-23 | Discharge: 2009-10-23 | Payer: Self-pay | Admitting: Emergency Medicine

## 2009-10-23 ENCOUNTER — Ambulatory Visit: Payer: Self-pay | Admitting: Diagnostic Radiology

## 2009-10-23 DIAGNOSIS — L02419 Cutaneous abscess of limb, unspecified: Secondary | ICD-10-CM

## 2009-10-23 DIAGNOSIS — L03119 Cellulitis of unspecified part of limb: Secondary | ICD-10-CM

## 2009-10-23 LAB — CONVERTED CEMR LAB
BUN: 12 mg/dL
Chloride: 105 meq/L
Glucose, Bld: 76 mg/dL
Lymphocytes, automated: 29 %
MCV: 80.2 fL
Monocytes Relative: 9 %
Neutrophils Relative %: 58 %
Platelets: 216 10*3/uL
Potassium: 4.4 meq/L
Sodium: 142 meq/L
WBC: 8.5 10*3/uL

## 2009-10-24 ENCOUNTER — Encounter: Payer: Self-pay | Admitting: Internal Medicine

## 2009-10-24 ENCOUNTER — Ambulatory Visit: Payer: Self-pay | Admitting: Internal Medicine

## 2009-11-15 ENCOUNTER — Telehealth: Payer: Self-pay | Admitting: Internal Medicine

## 2009-12-03 ENCOUNTER — Ambulatory Visit: Payer: Self-pay | Admitting: Internal Medicine

## 2009-12-12 ENCOUNTER — Encounter: Payer: Self-pay | Admitting: Internal Medicine

## 2009-12-17 ENCOUNTER — Encounter: Payer: Self-pay | Admitting: Internal Medicine

## 2009-12-18 ENCOUNTER — Ambulatory Visit: Payer: Self-pay | Admitting: Hematology & Oncology

## 2009-12-19 ENCOUNTER — Telehealth: Payer: Self-pay | Admitting: Internal Medicine

## 2009-12-19 ENCOUNTER — Encounter: Payer: Self-pay | Admitting: Internal Medicine

## 2009-12-19 ENCOUNTER — Encounter: Payer: Self-pay | Admitting: Nurse Practitioner

## 2009-12-19 LAB — HEPATIC FUNCTION PANEL
Albumin: 3.9 g/dL (ref 3.5–5.2)
Alkaline Phosphatase: 96 U/L (ref 39–117)
Total Bilirubin: 0.4 mg/dL (ref 0.3–1.2)
Total Protein: 7.7 g/dL (ref 6.0–8.3)

## 2009-12-19 LAB — COMPREHENSIVE METABOLIC PANEL
AST: 42 U/L — ABNORMAL HIGH (ref 0–37)
Albumin: 3.9 g/dL (ref 3.5–5.2)
BUN: 9 mg/dL (ref 6–23)
CO2: 22 mEq/L (ref 19–32)
Calcium: 8.7 mg/dL (ref 8.4–10.5)
Chloride: 102 mEq/L (ref 96–112)
Creatinine, Ser: 0.77 mg/dL (ref 0.40–1.20)
Glucose, Bld: 145 mg/dL — ABNORMAL HIGH (ref 70–99)
Potassium: 3.9 mEq/L (ref 3.5–5.3)

## 2009-12-19 LAB — CBC WITH DIFFERENTIAL (CANCER CENTER ONLY)
BASO#: 0.1 10*3/uL (ref 0.0–0.2)
Eosinophils Absolute: 0.2 10*3/uL (ref 0.0–0.5)
HCT: 34.7 % — ABNORMAL LOW (ref 34.8–46.6)
HGB: 11.1 g/dL — ABNORMAL LOW (ref 11.6–15.9)
LYMPH#: 2.4 10*3/uL (ref 0.9–3.3)
MCHC: 32 g/dL (ref 32.0–36.0)
MONO#: 1 10*3/uL — ABNORMAL HIGH (ref 0.1–0.9)
NEUT#: 6.5 10*3/uL (ref 1.5–6.5)
NEUT%: 64.1 % (ref 39.6–80.0)
RBC: 4.43 10*6/uL (ref 3.70–5.32)

## 2009-12-19 LAB — CEA: CEA: 5.3 ng/mL — ABNORMAL HIGH (ref 0.0–5.0)

## 2009-12-19 LAB — PHENYTOIN LEVEL, TOTAL: Phenytoin Lvl: 2.3 ug/mL — ABNORMAL LOW (ref 10.0–20.0)

## 2009-12-19 LAB — FERRITIN: Ferritin: 12 ng/mL (ref 10–291)

## 2009-12-23 ENCOUNTER — Ambulatory Visit: Payer: Self-pay | Admitting: Internal Medicine

## 2009-12-23 DIAGNOSIS — D509 Iron deficiency anemia, unspecified: Secondary | ICD-10-CM | POA: Insufficient documentation

## 2009-12-30 ENCOUNTER — Ambulatory Visit: Payer: Self-pay | Admitting: Internal Medicine

## 2009-12-30 DIAGNOSIS — D531 Other megaloblastic anemias, not elsewhere classified: Secondary | ICD-10-CM | POA: Insufficient documentation

## 2009-12-30 DIAGNOSIS — D518 Other vitamin B12 deficiency anemias: Secondary | ICD-10-CM

## 2010-01-01 ENCOUNTER — Encounter: Payer: Self-pay | Admitting: Internal Medicine

## 2010-01-01 LAB — CEA: CEA: 5.2 ng/mL — ABNORMAL HIGH (ref 0.0–5.0)

## 2010-01-01 LAB — CBC WITH DIFFERENTIAL (CANCER CENTER ONLY)
EOS%: 3 % (ref 0.0–7.0)
Eosinophils Absolute: 0.3 10*3/uL (ref 0.0–0.5)
HGB: 12.2 g/dL (ref 11.6–15.9)
LYMPH#: 2 10*3/uL (ref 0.9–3.3)
MCH: 26 pg (ref 26.0–34.0)
MCHC: 32.6 g/dL (ref 32.0–36.0)
MONO%: 8.6 % (ref 0.0–13.0)
NEUT#: 5.8 10*3/uL (ref 1.5–6.5)
Platelets: 216 10*3/uL (ref 145–400)
RBC: 4.71 10*6/uL (ref 3.70–5.32)

## 2010-01-02 ENCOUNTER — Encounter: Payer: Self-pay | Admitting: Internal Medicine

## 2010-01-02 ENCOUNTER — Ambulatory Visit (HOSPITAL_BASED_OUTPATIENT_CLINIC_OR_DEPARTMENT_OTHER): Admission: RE | Admit: 2010-01-02 | Discharge: 2010-01-02 | Payer: Self-pay | Admitting: Hematology & Oncology

## 2010-01-02 ENCOUNTER — Ambulatory Visit: Payer: Self-pay | Admitting: Diagnostic Radiology

## 2010-01-02 ENCOUNTER — Telehealth: Payer: Self-pay | Admitting: Internal Medicine

## 2010-01-06 ENCOUNTER — Telehealth: Payer: Self-pay | Admitting: Internal Medicine

## 2010-01-07 ENCOUNTER — Encounter: Payer: Self-pay | Admitting: Nurse Practitioner

## 2010-01-07 LAB — CBC WITH DIFFERENTIAL (CANCER CENTER ONLY)
BASO#: 0.1 10*3/uL (ref 0.0–0.2)
Eosinophils Absolute: 0.3 10*3/uL (ref 0.0–0.5)
LYMPH%: 24.6 % (ref 14.0–48.0)
MCH: 26.6 pg (ref 26.0–34.0)
MCV: 82 fL (ref 81–101)
MONO%: 9.8 % (ref 0.0–13.0)
Platelets: 194 10*3/uL (ref 145–400)
RBC: 4.85 10*6/uL (ref 3.70–5.32)

## 2010-01-09 ENCOUNTER — Ambulatory Visit: Payer: Self-pay | Admitting: Internal Medicine

## 2010-01-13 ENCOUNTER — Encounter: Payer: Self-pay | Admitting: Internal Medicine

## 2010-01-14 ENCOUNTER — Ambulatory Visit: Payer: Self-pay | Admitting: Internal Medicine

## 2010-01-15 ENCOUNTER — Telehealth: Payer: Self-pay | Admitting: Internal Medicine

## 2010-01-29 ENCOUNTER — Telehealth: Payer: Self-pay | Admitting: Internal Medicine

## 2010-01-29 ENCOUNTER — Ambulatory Visit: Payer: Self-pay | Admitting: Hematology & Oncology

## 2010-01-29 ENCOUNTER — Encounter: Payer: Self-pay | Admitting: Internal Medicine

## 2010-01-29 LAB — CBC WITH DIFFERENTIAL (CANCER CENTER ONLY)
BASO#: 0.1 10*3/uL (ref 0.0–0.2)
EOS%: 3.3 % (ref 0.0–7.0)
Eosinophils Absolute: 0.3 10*3/uL (ref 0.0–0.5)
HCT: 39.7 % (ref 34.8–46.6)
HGB: 13.3 g/dL (ref 11.6–15.9)
LYMPH#: 2 10*3/uL (ref 0.9–3.3)
MCH: 28.2 pg (ref 26.0–34.0)
MCHC: 33.5 g/dL (ref 32.0–36.0)
MONO%: 7.2 % (ref 0.0–13.0)
NEUT%: 63 % (ref 39.6–80.0)
RBC: 4.72 10*6/uL (ref 3.70–5.32)

## 2010-01-29 LAB — COMPREHENSIVE METABOLIC PANEL
AST: 67 U/L — ABNORMAL HIGH (ref 0–37)
Albumin: 4.1 g/dL (ref 3.5–5.2)
BUN: 9 mg/dL (ref 6–23)
Calcium: 9.1 mg/dL (ref 8.4–10.5)
Chloride: 101 mEq/L (ref 96–112)
Creatinine, Ser: 0.8 mg/dL (ref 0.40–1.20)
Glucose, Bld: 199 mg/dL — ABNORMAL HIGH (ref 70–99)
Potassium: 4 mEq/L (ref 3.5–5.3)

## 2010-01-30 ENCOUNTER — Ambulatory Visit: Payer: Self-pay | Admitting: Internal Medicine

## 2010-02-17 ENCOUNTER — Telehealth: Payer: Self-pay | Admitting: Internal Medicine

## 2010-02-25 ENCOUNTER — Ambulatory Visit: Payer: Self-pay | Admitting: Internal Medicine

## 2010-02-25 DIAGNOSIS — K5520 Angiodysplasia of colon without hemorrhage: Secondary | ICD-10-CM

## 2010-03-17 ENCOUNTER — Encounter: Payer: Self-pay | Admitting: Internal Medicine

## 2010-03-17 ENCOUNTER — Ambulatory Visit: Payer: Self-pay | Admitting: Hematology & Oncology

## 2010-03-17 LAB — CBC WITH DIFFERENTIAL (CANCER CENTER ONLY)
BASO%: 1 % (ref 0.0–2.0)
EOS%: 2.7 % (ref 0.0–7.0)
LYMPH%: 29.2 % (ref 14.0–48.0)
MCH: 29.5 pg (ref 26.0–34.0)
MCHC: 34 g/dL (ref 32.0–36.0)
MCV: 87 fL (ref 81–101)
MONO%: 7.5 % (ref 0.0–13.0)
Platelets: 161 10*3/uL (ref 145–400)
RDW: 12.3 % (ref 10.5–14.6)

## 2010-03-17 LAB — COMPREHENSIVE METABOLIC PANEL
ALT: 32 U/L (ref 0–35)
AST: 49 U/L — ABNORMAL HIGH (ref 0–37)
Alkaline Phosphatase: 84 U/L (ref 39–117)
Glucose, Bld: 170 mg/dL — ABNORMAL HIGH (ref 70–99)
Sodium: 138 mEq/L (ref 135–145)
Total Bilirubin: 0.4 mg/dL (ref 0.3–1.2)
Total Protein: 7.5 g/dL (ref 6.0–8.3)

## 2010-03-18 ENCOUNTER — Ambulatory Visit: Payer: Self-pay | Admitting: Internal Medicine

## 2010-03-18 DIAGNOSIS — R011 Cardiac murmur, unspecified: Secondary | ICD-10-CM

## 2010-03-20 ENCOUNTER — Ambulatory Visit: Payer: Self-pay | Admitting: Internal Medicine

## 2010-03-20 LAB — CONVERTED CEMR LAB
Albumin: 3.7 g/dL (ref 3.5–5.2)
BUN: 10 mg/dL (ref 6–23)
Basophils Relative: 0.4 % (ref 0.0–3.0)
CO2: 28 meq/L (ref 19–32)
Calcium: 9.1 mg/dL (ref 8.4–10.5)
Creatinine, Ser: 0.6 mg/dL (ref 0.4–1.2)
Eosinophils Absolute: 0.2 10*3/uL (ref 0.0–0.7)
Eosinophils Relative: 2.4 % (ref 0.0–5.0)
Glucose, Bld: 166 mg/dL — ABNORMAL HIGH (ref 70–99)
HDL: 33.8 mg/dL — ABNORMAL LOW (ref >39.00)
Lymphocytes Relative: 27.4 % (ref 12.0–46.0)
MCHC: 35.2 g/dL (ref 30.0–36.0)
Monocytes Relative: 8.9 % (ref 3.0–12.0)
Neutrophils Relative %: 60.9 % (ref 43.0–77.0)
RBC: 4.58 M/uL (ref 3.87–5.11)
TSH: 1.54 microintl units/mL (ref 0.35–5.50)
Total Protein: 7.1 g/dL (ref 6.0–8.3)
Triglycerides: 427 mg/dL — ABNORMAL HIGH (ref 0.0–149.0)
Vitamin B-12: 352 pg/mL (ref 211–911)
WBC: 8.7 10*3/uL (ref 4.5–10.5)

## 2010-03-25 ENCOUNTER — Ambulatory Visit: Payer: Self-pay | Admitting: Internal Medicine

## 2010-03-26 ENCOUNTER — Ambulatory Visit: Payer: Self-pay | Admitting: Cardiology

## 2010-03-26 ENCOUNTER — Ambulatory Visit (HOSPITAL_COMMUNITY): Admission: RE | Admit: 2010-03-26 | Discharge: 2010-03-26 | Payer: Self-pay | Admitting: Internal Medicine

## 2010-03-26 ENCOUNTER — Encounter: Payer: Self-pay | Admitting: Internal Medicine

## 2010-03-26 ENCOUNTER — Ambulatory Visit: Payer: Self-pay

## 2010-03-27 ENCOUNTER — Telehealth: Payer: Self-pay | Admitting: Internal Medicine

## 2010-04-18 ENCOUNTER — Ambulatory Visit: Payer: Self-pay | Admitting: Internal Medicine

## 2010-04-21 ENCOUNTER — Telehealth (INDEPENDENT_AMBULATORY_CARE_PROVIDER_SITE_OTHER): Payer: Self-pay | Admitting: *Deleted

## 2010-04-22 DIAGNOSIS — Z8669 Personal history of other diseases of the nervous system and sense organs: Secondary | ICD-10-CM | POA: Insufficient documentation

## 2010-04-23 ENCOUNTER — Ambulatory Visit: Payer: Self-pay | Admitting: Cardiovascular Disease

## 2010-05-28 ENCOUNTER — Ambulatory Visit: Payer: Self-pay | Admitting: Internal Medicine

## 2010-05-28 DIAGNOSIS — N390 Urinary tract infection, site not specified: Secondary | ICD-10-CM

## 2010-05-28 DIAGNOSIS — N760 Acute vaginitis: Secondary | ICD-10-CM | POA: Insufficient documentation

## 2010-05-28 DIAGNOSIS — D485 Neoplasm of uncertain behavior of skin: Secondary | ICD-10-CM

## 2010-05-30 LAB — CONVERTED CEMR LAB
BUN: 10 mg/dL (ref 6–23)
Basophils Relative: 0.4 % (ref 0.0–3.0)
Eosinophils Absolute: 0.2 10*3/uL (ref 0.0–0.7)
Eosinophils Relative: 2.8 % (ref 0.0–5.0)
GFR calc non Af Amer: 88.77 mL/min (ref >60)
Glucose, Bld: 124 mg/dL — ABNORMAL HIGH (ref 70–99)
HCT: 38.9 % (ref 36.0–46.0)
Ketones, ur: NEGATIVE mg/dL
MCV: 88 fL (ref 78.0–100.0)
Neutro Abs: 4.9 10*3/uL (ref 1.4–7.7)
Platelets: 169 10*3/uL (ref 150.0–400.0)
Potassium: 4.3 meq/L (ref 3.5–5.1)
RDW: 14 % (ref 11.5–14.6)
Specific Gravity, Urine: 1.02 (ref 1.000–1.030)
Total Protein, Urine: NEGATIVE mg/dL
Urine Glucose: NEGATIVE mg/dL
pH: 5.5 (ref 5.0–8.0)

## 2010-06-13 ENCOUNTER — Ambulatory Visit (HOSPITAL_BASED_OUTPATIENT_CLINIC_OR_DEPARTMENT_OTHER): Payer: Medicare Other | Admitting: Hematology & Oncology

## 2010-06-16 ENCOUNTER — Encounter: Payer: Self-pay | Admitting: Internal Medicine

## 2010-06-16 LAB — CBC WITH DIFFERENTIAL (CANCER CENTER ONLY)
BASO#: 0.1 10*3/uL (ref 0.0–0.2)
BASO%: 0.7 % (ref 0.0–2.0)
EOS%: 3.5 % (ref 0.0–7.0)
HGB: 12.8 g/dL (ref 11.6–15.9)
LYMPH#: 2.2 10*3/uL (ref 0.9–3.3)
MCHC: 33.6 g/dL (ref 32.0–36.0)
MONO#: 0.7 10*3/uL (ref 0.1–0.9)
NEUT#: 4.6 10*3/uL (ref 1.5–6.5)
RDW: 12.1 % (ref 10.5–14.6)
WBC: 7.8 10*3/uL (ref 3.9–10.0)

## 2010-06-16 LAB — CEA: CEA: 7.3 ng/mL — ABNORMAL HIGH (ref 0.0–5.0)

## 2010-06-17 ENCOUNTER — Encounter: Payer: Self-pay | Admitting: Internal Medicine

## 2010-06-27 ENCOUNTER — Ambulatory Visit: Payer: Self-pay | Admitting: Diagnostic Radiology

## 2010-06-27 ENCOUNTER — Ambulatory Visit (HOSPITAL_BASED_OUTPATIENT_CLINIC_OR_DEPARTMENT_OTHER): Admission: RE | Admit: 2010-06-27 | Discharge: 2010-06-27 | Payer: Self-pay | Admitting: Hematology & Oncology

## 2010-06-27 LAB — CMP (CANCER CENTER ONLY)
ALT(SGPT): 26 U/L (ref 10–47)
Albumin: 3.8 g/dL (ref 3.3–5.5)
Alkaline Phosphatase: 74 U/L (ref 26–84)
Potassium: 4.3 mEq/L (ref 3.3–4.7)
Sodium: 140 mEq/L (ref 128–145)
Total Bilirubin: 0.7 mg/dl (ref 0.20–1.60)
Total Protein: 8 g/dL (ref 6.4–8.1)

## 2010-07-14 ENCOUNTER — Ambulatory Visit: Payer: Self-pay | Admitting: Internal Medicine

## 2010-07-23 ENCOUNTER — Telehealth (INDEPENDENT_AMBULATORY_CARE_PROVIDER_SITE_OTHER): Payer: Self-pay | Admitting: *Deleted

## 2010-07-25 ENCOUNTER — Encounter: Payer: Self-pay | Admitting: Internal Medicine

## 2010-08-08 ENCOUNTER — Ambulatory Visit: Payer: Self-pay | Admitting: Internal Medicine

## 2010-08-08 DIAGNOSIS — M25539 Pain in unspecified wrist: Secondary | ICD-10-CM

## 2010-08-19 ENCOUNTER — Ambulatory Visit: Payer: Self-pay | Admitting: Internal Medicine

## 2010-08-20 LAB — CONVERTED CEMR LAB
AST: 74 units/L — ABNORMAL HIGH (ref 0–37)
Albumin: 3.7 g/dL (ref 3.5–5.2)
CO2: 27 meq/L (ref 19–32)
Calcium: 8.7 mg/dL (ref 8.4–10.5)
Creatinine, Ser: 0.8 mg/dL (ref 0.4–1.2)
GFR calc non Af Amer: 78.03 mL/min (ref >60)
Phenytoin Lvl: 4.1 ug/mL — ABNORMAL LOW (ref 10.0–20.0)
Sodium: 137 meq/L (ref 135–145)
TSH: 2.09 microintl units/mL (ref 0.35–5.50)
Total Bilirubin: 0.4 mg/dL (ref 0.3–1.2)

## 2010-08-22 ENCOUNTER — Ambulatory Visit: Payer: Self-pay | Admitting: Internal Medicine

## 2010-08-22 DIAGNOSIS — B079 Viral wart, unspecified: Secondary | ICD-10-CM | POA: Insufficient documentation

## 2010-08-22 DIAGNOSIS — L570 Actinic keratosis: Secondary | ICD-10-CM

## 2010-09-15 ENCOUNTER — Ambulatory Visit: Payer: Self-pay | Admitting: Internal Medicine

## 2010-09-17 ENCOUNTER — Ambulatory Visit: Payer: Self-pay | Admitting: Internal Medicine

## 2010-09-30 ENCOUNTER — Telehealth: Payer: Self-pay | Admitting: Internal Medicine

## 2010-10-01 ENCOUNTER — Ambulatory Visit: Payer: Self-pay | Admitting: Internal Medicine

## 2010-10-01 ENCOUNTER — Telehealth (INDEPENDENT_AMBULATORY_CARE_PROVIDER_SITE_OTHER): Payer: Self-pay | Admitting: *Deleted

## 2010-10-01 ENCOUNTER — Telehealth: Payer: Self-pay | Admitting: Internal Medicine

## 2010-10-09 ENCOUNTER — Other Ambulatory Visit: Payer: Self-pay | Admitting: Internal Medicine

## 2010-10-09 LAB — GLUCOSE, CAPILLARY
Glucose-Capillary: 104 mg/dL — ABNORMAL HIGH (ref 70–99)
Glucose-Capillary: 84 mg/dL (ref 70–99)

## 2010-10-10 LAB — HELICOBACTER PYLORI SCREEN-BIOPSY: UREASE: NEGATIVE

## 2010-10-21 ENCOUNTER — Encounter: Payer: Self-pay | Admitting: Cardiovascular Disease

## 2010-10-21 ENCOUNTER — Ambulatory Visit: Admission: RE | Admit: 2010-10-21 | Discharge: 2010-10-21 | Payer: Self-pay | Source: Home / Self Care

## 2010-10-21 ENCOUNTER — Ambulatory Visit
Admission: RE | Admit: 2010-10-21 | Discharge: 2010-10-21 | Payer: Self-pay | Source: Home / Self Care | Attending: Cardiovascular Disease | Admitting: Cardiovascular Disease

## 2010-10-21 ENCOUNTER — Ambulatory Visit (HOSPITAL_COMMUNITY)
Admission: RE | Admit: 2010-10-21 | Discharge: 2010-10-21 | Payer: Self-pay | Source: Home / Self Care | Attending: Cardiovascular Disease | Admitting: Cardiovascular Disease

## 2010-10-22 ENCOUNTER — Telehealth: Payer: Self-pay | Admitting: Internal Medicine

## 2010-10-24 ENCOUNTER — Encounter: Payer: Self-pay | Admitting: Internal Medicine

## 2010-10-26 ENCOUNTER — Encounter: Payer: Self-pay | Admitting: Neurology

## 2010-10-26 ENCOUNTER — Encounter: Payer: Self-pay | Admitting: Orthopaedic Surgery

## 2010-10-27 ENCOUNTER — Encounter: Payer: Self-pay | Admitting: Internal Medicine

## 2010-10-27 ENCOUNTER — Encounter: Payer: Self-pay | Admitting: Hematology & Oncology

## 2010-11-03 ENCOUNTER — Telehealth (INDEPENDENT_AMBULATORY_CARE_PROVIDER_SITE_OTHER): Payer: Self-pay | Admitting: *Deleted

## 2010-11-04 NOTE — Assessment & Plan Note (Signed)
Summary: np3/ dx: heart mumumur  pt has medicare tricare- retired/ gd  Medications Added NITROFURANTOIN MACROCRYSTAL 100 MG CAPS (NITROFURANTOIN MACROCRYSTAL) Take 1 tablet by mouth two times a day NEURONTIN 300 MG CAPS (GABAPENTIN) Take 1 capsule by mouth two times a day        Visit Type:  Initial Consult Referring Provider:  n/a Primary Provider:  Sula Soda, MD   CC:  Heart murmur.  History of Present Illness: Monique Fleming is seen today at the request of Dr Maple Hudson.  She has fatigue, SOB and murmur.  I have seen her daughter [name] in the past.  She was hospitalized in 2008 and seen by Dr Teressa Lower.  She did not like the encounter and is deathly afraid of having a heart cath. Her husband died at [facility] after CABG and it appears that it was from a mistake with chest tube placement.  Her SOB is from COPD and ongoing smoking.  She is trying to use nicorette gum and choose a quit date.  Recent PFT's show moderat obstruction.  She has had a long standing murmur.  Echo done 03/26/10 showed a mean gradient of 20 and peak of 32 mmHg.  EF is normal.  There is no history of CAD.  She has had a previous LCEA and recent duplex at [facility] indicating no new carotid disease.  She denies SSCP, palpitations, syncope or edema.  Chronic SOB with no cough or wheezing  Current Problems (verified): 1)  Transient Ischemic Attack, Hx of  (ICD-V12.50) 2)  Peripheral Vascular Disease  (ICD-443.9) 3)  Systolic Murmur  (ICD-785.2) 4)  Angiodysplasia-intestine  (ICD-569.84) 5)  Anemia-b12 Deficiency  (ICD-281.1) 6)  Anemia, Iron Deficiency  (ICD-280.9) 7)  Cellulitis, Ankle, Left  (ICD-682.6) 8)  Breast Pain  (ICD-611.71) 9)  Abnormal Findings Gi Tract  (ICD-793.4) 10)  Cirrhosis  (ICD-571.5) 11)  Personal Hx Colonic Polyps  (ICD-V12.72) 12)  Diverticulosis-colon  (ICD-562.10) 13)  Personal Hx Colon Cancer  (ICD-V10.05) 14)  Diarrhea  (ICD-787.91) 15)  Belching  (ICD-787.3) 16)  Infectious Diarrhea  (ICD-009.2) 17)   Unspecified Breast Disorder  (ICD-611.9) 18)  Dysuria  (ICD-788.1) 19)  Seizure Disorder  (ICD-780.39) 20)  Elbow Pain  (ICD-719.42) 21)  Confusion  (ICD-298.9) 22)  Sinusitis, Acute  (ICD-461.9) 23)  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) 24)  Upper Respiratory Infection (URI)  (ICD-465.9) 25)  Neck Pain  (ICD-723.1) 26)  COPD  (ICD-496) 27)  Abnormal Liver Function Tests  (ICD-794.8) 28)  Thrush  (ICD-112.0) 29)  Fatigue  (ICD-780.79) 30)  Gerd  (ICD-530.81) 31)  Shoulder Pain  (ICD-719.41) 32)  Irritable Bowel Syndrome  (ICD-564.1) 33)  Diabetes Mellitus, Type II  (ICD-250.00) 34)  Bronchitis  (ICD-490) 35)  Tobacco Abuse  (ICD-305.1) 36)  Atherosclerosis  (ICD-440.9) 37)  Insomnia, Chronic  (ICD-307.42) 38)  Adenocarcinoma, Colon  (ICD-153.9) 39)  Depression  (ICD-311) 40)  Anxiety  (ICD-300.00) 41)  Renal Cyst, Left  (ICD-593.2) 42)  Sleep Related Movement Disorder Unspecified  (ICD-780.58) 43)  Seizures, Hx of  (ICD-V12.49)  Current Medications (verified): 1)  Plavix 75 Mg  Tabs (Clopidogrel Bisulfate) .... Take 1 Tablet By Mouth Once A Day 2)  Potassium Chloride Cr 10 Meq  Cpcr (Potassium Chloride) .... 2 Once Daily 3)  Klonopin 0.5 Mg Tabs (Clonazepam) .... 2 Tablet By Mouth  At Bedtime 4)  Accu-Chek Compact  Strp (Glucose Blood) .... Once Daily-Bid 5)  Dilantin 100 Mg Caps (Phenytoin Sodium Extended) .... 2 By Mouth Qhs 6)  Lexapro 20 Mg Tabs (Escitalopram Oxalate) .... One Tablet By Mouth Once Daily At [facility] 7)  Glimepiride 1 Mg Tabs (Glimepiride) .Marland Kitchen.. 1 Tab By Mouth Bid 8)  Vitamin D 1000 Unit Tabs (Cholecalciferol) .Marland Kitchen.. 1 By Mouth Qd 9)  Ibuprofen 600 Mg Tabs (Ibuprofen) .Marland Kitchen.. 1 By Mouth Bid  Pc X 1 Wk Then As Needed For  Pain 10)  Infed 50 Mg/ml Soln (Iron Dextran) .... [facility] 11)  Ferrous Sulfate 325 (65 Fe) Mg  Tabs (Ferrous Sulfate) .... One By Mouth Two Times A Day Take Indefinitely 12)  Proair Hfa 108 (90 Base) Mcg/act Aers (Albuterol Sulfate)  .... 2 Puffs Four Times A Day As Needed Rescue Inhaler 13)  Nitrofurantoin Macrocrystal 100 Mg Caps (Nitrofurantoin Macrocrystal) .... Take 1 Tablet By Mouth Two Times A Day 14)  Neurontin 300 Mg Caps (Gabapentin) .... Take 1 Capsule By Mouth Two Times A Day  Allergies: 1)  ! Penicillin 2)  Mevacor 3)  Metformin Hcl  Past History:  Past Medical History: Last updated: 04/22/2010 AS: moderate by echo 6/11 gradient 20/36 mmHg for mean and peak TRANSIENT ISCHEMIC ATTACK Dr Pearlean Brownie PERIPHERAL VASCULAR DISEASE  SYSTOLIC MURMUR  ADENOCARCINOMA, COLON 2008S/P RIGHT HEMICOLECTOMY IRRITABLE BOWEL SYNDROME  Dr [name] DIABETES MELLITUS, TYPE II  BRONCHITIS  ATHEROSCLEROSIS DEPRESSION  ANXIETY    Dr Donell Beers RENAL CYST, LEFT  SLEEP RELATED MOVEMENT DISORDER UNSPECIFIED  CIRRHOSIS AORTIC Stenosis- moderate, ECHO 2011  Seizure disorder 2009 Dr Pearlean Brownie GYN Dr Tenny Craw ANEMIA, IRON DEFICIENCY  CELLULITIS, ANKLE, LEFT  BREAST PAIN  ABNORMAL FINDINGS GI TRACT PERSONAL HX COLONIC POLYPS  DIVERTICULOSIS-COLON  PERSONAL HX COLON CANCER BELCHING  INFECTIOUS DIARRHEA SINUSITIS, ACUTE COPD  ABNORMAL LIVER FUNCTION TESTS  THRUSH FATIGUE  GERD BRONCHITIS ATHEROSCLEROSIS  INSOMNIA, CHRONIC RENAL CYST, LEFT   Past Surgical History: Last updated: 03/08/2009 Carotid endarterectomy EUS (01/07/2006 , and 04/22/2006) EGD (11/19/05) Right hemi-Colectomy   2008 Breast-Lumpectomy 11/09  Family History: Last updated: 12/30/2009 Family History Hypertension Family History of Colon Cancer: Paternal Grandmother Family History of Esophageal Cancer: brother  Social History: Last updated: 02/25/2010 2-3 cigs/ day- rare, occasional- encouraged to stop Divorced with children Occupation: Retired Alcohol use-never Drug use-never Regular exercise-yes chair yoga 2010 Daily Caffeine Use  Review of Systems       Denies fever, malais, weight loss, blurry vision, decreased visual acuity, cough, sputum,  hemoptysis, pleuritic pain, palpitaitons, heartburn, abdominal pain, melena, lower extremity edema, claudication, or rash.   Vital Signs:  Patient profile:   77 year old female Height:      65 inches Weight:      137.50 pounds BMI:     22.96 Pulse rate:   73 / minute Pulse rhythm:   regular Resp:     18 per minute BP sitting:   139 / 63  (left arm) Cuff size:   regular  Vitals Entered By: Vikki Ports (April 23, 2010 3:49 PM)  Physical Exam  General:  Affect appropriate Healthy:  appears stated age HEENT: normal Neck supple with no adenopathy JVP normal no bruits no thyromegaly Lungs clear with no wheezing and good diaphragmatic motion Heart:  S1/S2 AS murmur,rub, gallop or click PMI normal Abdomen: benighn, BS positve, no tenderness, no AAA no bruit.  No HSM or HJR Distal pulses intact with no bruits No edema Neuro non-focal Skin warm and dry    Impression & Recommendations:  Problem # 1:  TRANSIENT ISCHEMIC ATTACK, HX OF (ICD-V12.50) Bilateral carotid bruits with  previous CEA on left  F/U [facility] Recent duplex normal  Problem # 2:  SYSTOLIC MURMUR (VVO-160.2) Moderate AS should not be significant at this point.  S2 preserved and normal LV function.  Given current age and smoking if valve progresses I dont think she would ever be an operative candidate Orders: EKG w/ Interpretation (93000) Echocardiogram (Echo)  Problem # 3:  COPD (ICD-496) F/U Dr Maple Hudson. Continue Proair.  Primary etiology to fatigue and SOB Her updated medication list for this problem includes:    Proair Hfa 108 (90 Base) Mcg/act Aers (Albuterol sulfate) .Marland Kitchen... 2 puffs four times a day as needed rescue inhaler  Patient Instructions: 1)  Your physician recommends that you schedule a follow-up appointment in: 6 months with dr Eden Emms echo same day 2)  Your physician recommends that you continue on your current medications as directed. Please refer to the Current Medication list given to you today. 3)   Your physician has requested that you have an echocardiogram.  Echocardiography is a painless test that uses sound waves to create images of your heart. It provides your doctor with information about the size and shape of your heart and how well your heart's chambers and valves are working.  This procedure takes approximately one hour. There are no restrictions for this procedure.see dr Eden Emms same day   Echocardiogram Report  Procedure date:  04/23/2010  Findings:      NSR  Nonspecific ST/T wave changes Rate 75

## 2010-11-04 NOTE — Progress Notes (Signed)
Summary: Req for RX  Phone Note Call from Patient   Caller: Daughter - Lynden Ang [phone] Summary of Call: Pt's daughter called. Pt has recurrence of redness and swelling on the same area she had the cellulitis last month. She is unable to bring pt into the office today and wants to know if antibiotic could be called in?  Initial call taken by: Lamar Sprinkles, CMA,  November 15, 2009 9:44 AM  Follow-up for Phone Call        ok to ref [facility] if needed Follow-up by: Tresa Garter MD,  November 15, 2009 1:07 PM  Additional Follow-up for Phone Call Additional follow up Details #1::        Pt's daughter informed  Additional Follow-up by: Lamar Sprinkles, CMA,  November 15, 2009 1:19 PM    Prescriptions: DOXYCYCLINE HYCLATE 100 MG CAPS (DOXYCYCLINE HYCLATE) take 1 two times a day for 7 days  #14 x 0   Entered by:   Lamar Sprinkles, CMA   Authorized by:   Tresa Garter MD   Signed by:   Lamar Sprinkles, CMA on 11/15/2009   Method used:   Electronically to        [facility]* (retail)       [address]       [place], [place]  [address]       Ph: [phone]       Fax: [phone]   RxID:   [id number]

## 2010-11-04 NOTE — Progress Notes (Signed)
Summary: ? re meds    Phone Note Call from Patient Call back at Home Phone [phone]   Caller: Patient Call For: [name] Reason for Call: Talk to Nurse Summary of Call: Patient has questions regarding medication before her procedure on Thurs. Initial call taken by: Tawni Levy,  January 06, 2010 2:05 PM  Follow-up for Phone Call        Clarified with pt. that it is ok to use her cholestyramine prior to procedure. Follow-up by: Teryl Lucy RN,  January 06, 2010 2:17 PM

## 2010-11-04 NOTE — Assessment & Plan Note (Signed)
Summary: FOLLOWUP - cirrhosis, GERD, anemia   History of Present Illness Visit Type: Follow-up Visit Primary GI MD: Yancey Flemings MD Primary Provider: Sula Soda, MD  Requesting Provider: n/a Chief Complaint: nausea and heartburn, pt also says she has a pain in her upper abdomen that feels like someone is sticking pins in her.  Pt wonders if it is related to liver. History of Present Illness:   77 year old white female with multiple significant medical problems including type 2 diabetes mellitus, cerebrovascular disease with prior TIA for which she is on chronic Plavix therapy, colon cancer status post right hemicolectomy, adenomatous colon polyps, seizure disorder,chronic anxiety/depression, osteoarthritis, tobacco abuse with intrinsic lung disease, diarrhea predominant irritable bowel syndrome, bile salt induced diarrhea, cryptogenic cirrhosis, and iron deficiency anemia due to colonic arteriovenous malformations. She presents today for routine followup regarding her myriad of GI issues. She comes with a sheet of paper with multiple questions on multiple issues. No active complaints include a 2 month history of morning nausea and heartburn. Chronic complaints include ongoing fatigue. Her hepatic cirrhosis remains compensated. Screening endoscopy last fall revealed no varices. No obvious etiology for her liver disease, though NASH or prior autoimmune disease possible. She manages her diarrhea with Questran. Most recent colonoscopy from last month revealed multiple small adenomas which were removed and descending colon AVMs. Prominent rectal veins consistent with portal hypertension. She also mentioned some them and sharp pains in the epigastric or quadrant region which are described as "pins and needles" and last for a few seconds. Not brought on or relieved by anything in particular.. Review of outside laboratories from April 27 reveal markedly improved hemoglobin of 13.3. Normal white blood cell count  and platelets.   GI Review of Systems    Reports abdominal pain, acid reflux, heartburn, and  nausea.     Location of  Abdominal pain: upper abdomen.    Denies belching, bloating, chest pain, dysphagia with liquids, dysphagia with solids, loss of appetite, vomiting, vomiting blood, weight loss, and  weight gain.      Reports diarrhea, irritable bowel syndrome, and  liver problems.     Denies anal fissure, black tarry stools, change in bowel habit, constipation, diverticulosis, fecal incontinence, heme positive stool, hemorrhoids, jaundice, light color stool, rectal bleeding, and  rectal pain.    Current Medications (verified): 1)  Plavix 75 Mg  Tabs (Clopidogrel Bisulfate) .... Take 1 Tablet By Mouth Once A Day 2)  Potassium Chloride Cr 10 Meq  Cpcr (Potassium Chloride) .... 2 Once Daily 3)  Klonopin 0.5 Mg Tabs (Clonazepam) .... 2 Tablet By Mouth  At Bedtime 4)  Accu-Chek Compact  Strp (Glucose Blood) .... Once Daily-Bid 5)  Zovirax 5 % Crea (Acyclovir) .... Use Every 2 Hours X 5 Days For Cold Sores 6)  Dilantin 100 Mg Caps (Phenytoin Sodium Extended) .... 2 By Mouth Qhs 7)  Lexapro 20 Mg Tabs (Escitalopram Oxalate) .... One Tablet By Mouth Once Daily At [facility] 8)  Glimepiride 1 Mg Tabs (Glimepiride) .Marland Kitchen.. 1 Tab By Mouth Bid 9)  Vitamin D 1000 Unit Tabs (Cholecalciferol) .Marland Kitchen.. 1 By Mouth Qd 10)  Ibuprofen 600 Mg Tabs (Ibuprofen) .Marland Kitchen.. 1 By Mouth Bid  Pc X 1 Wk Then As Needed For  Pain 11)  Questran 4 Gm Pack (Cholestyramine) .... Use One Packet Two Times A Day 12)  Infed 50 Mg/ml Soln (Iron Dextran) .... [facility] 13)  Ferrous Sulfate 325 (65 Fe) Mg  Tabs (Ferrous Sulfate) .... One  By Mouth Two Times A Day Take Indefinitely 14)  Proair Hfa 108 (90 Base) Mcg/act Aers (Albuterol Sulfate) .... 2 Puffs Four Times A Day As Needed Rescue Inhaler  Allergies (verified): 1)  ! Penicillin 2)  Mevacor 3)  Amaryl (Glimepiride) 4)  Metformin Hcl  Past History:  Past Medical  History: Reviewed history from 12/30/2009 and no changes required. IRRITABLE BOWEL SYNDROME (ICD-564.1) Dr [name] DIABETES MELLITUS, TYPE II (ICD-250.00) TRANSIENT ISCHEMIC ATTACK, HX OF (ICD-V12.50) Dr Pearlean Brownie BRONCHITIS (ICD-490) TOBACCO ABUSE (ICD-305.1) ATHEROSCLEROSIS (ICD-440.9) INSOMNIA, CHRONIC (ICD-307.42 ADENOCARCINOMA, COLON (ICD-153.9) 2008S/P RIGHT HEMICOLECTOMY PERIPHERAL VASCULAR DISEASE (ICD-443.9) DEPRESSION (ICD-311) ANXIETY (ICD-300.00)    Dr Donell Beers RENAL CYST, LEFT (ICD-593.2) SLEEP RELATED MOVEMENT DISORDER UNSPECIFIED (ICD-780.58) CIRRHOSIS   Seizure disorder 2009 Dr Pearlean Brownie GYN Dr Tenny Craw  Past Surgical History: Reviewed history from 03/08/2009 and no changes required. Carotid endarterectomy EUS (01/07/2006 , and 04/22/2006) EGD (11/19/05) Right hemi-Colectomy   2008 Breast-Lumpectomy 11/09  Family History: Reviewed history from 12/30/2009 and no changes required. Family History Hypertension Family History of Colon Cancer: Paternal Grandmother Family History of Esophageal Cancer: brother  Social History: 2-3 cigs/ day- rare, occasional- encouraged to stop Divorced with children Occupation: Retired Alcohol use-never Drug use-never Regular exercise-yes chair yoga 2010 Daily Caffeine Use  Review of Systems       The patient complains of fatigue.  The patient denies allergy/sinus, anemia, anxiety-new, arthritis/joint pain, back pain, blood in urine, breast changes/lumps, change in vision, confusion, cough, coughing up blood, depression-new, fainting, fever, headaches-new, hearing problems, heart murmur, heart rhythm changes, itching, menstrual pain, muscle pains/cramps, night sweats, nosebleeds, pregnancy symptoms, shortness of breath, skin rash, sleeping problems, sore throat, swelling of feet/legs, swollen lymph glands, thirst - excessive, urination - excessive, urination changes/pain, urine leakage, voice change, thirst - excessive , urination - excessive , and  vision changes.    Vital Signs:  Patient profile:   77 year old female Height:      65 inches Weight:      136 pounds BMI:     22.71 Pulse rate:   76 / minute Pulse rhythm:   regular BP sitting:   142 / 64  (left arm) Cuff size:   regular  Vitals Entered By: Francee Piccolo CMA Duncan Dull) (Feb 25, 2010 4:06 PM)  Physical Exam  General:  Well developed, well nourished, no acute distress. Head:  Normocephalic and atraumatic. Eyes:  anicteric Ears:  decreased hearing Nose:  No deformity, discharge,  or lesions. Mouth:  no thrush Neck:  Supple; no masses or thyromegaly. Lungs:  Clear throughout to auscultation. Heart:  2/6 systolic murmur Abdomen:  Soft, nontender and nondistended. No masses, hepatosplenomegaly or hernias noted. Normal bowel sounds. Msk:  Symmetrical with no gross deformities. Normal posture. Pulses:  Normal pulses noted. Extremities:  No clubbing, cyanosis, edema or deformities noted. Neurologic:  Alert and  oriented x4;  grossly normal neurologically.no asterixis Skin:  Intact without significant lesions or rashes. Psych:  Alert and cooperative. Normal mood and affect.   Impression & Recommendations:  Problem # 1:  ANEMIA, IRON DEFICIENCY (ICD-280.9) Deficiency anemia likely secondary to chronic GI blood loss from colonic AVMs. Recent hemoglobin after iron infusion normal at 13.3.  Plan: #1. Continue oral iron #2. Anemia recurs on oral iron then iron transfusion #3. Monitor hemoglobin with PCP her hematologist periodically #4. For acute or subacute bleeding, consider APC endoscopic ablation  Problem # 2:  ANGIODYSPLASIA-INTESTINE (ZOX-096.04) colonic. See above  Problem # 3:  CIRRHOSIS (ICD-571.5) compensated hepatic cirrhosis  as identified on CT scan. No clear-cut etiology. Possibly burned-out Elita Boone or autoimmune disease. Could consider liver biopsy but uncertain but this will alter management.  Plan: #1. Long discussion today on general aspects of  hepatic cirrhosis. Also discussed compensated versus decompensated disease. Discuss potential complications. #2. Repeat screening upper endoscopy in 6 months #3. Routine office followup in 6 months  Problem # 4:  PERSONAL HX COLONIC POLYPS (ICD-V12.72) adenomatous colon polyps. Consider repeat followup in 3 years  Problem # 5:  PERSONAL HX COLON CANCER (ICD-V10.05) stage I. No evidence of recurrence. Status post right hemicolectomy  Problem # 6:  GERD (ICD-530.81) active GERD symptoms  Plan: #1. Nexium 40 mg daily. Several weeks of samples have been provided. If this is helpful that she can purchase Prilosec OTC or generic omeprazole #2. Reflux precautions  Problem # 7:  DIARRHEA (ICD-787.91) diarrhea in part due to bile salts and IBS  Plan: #1. Continue Questran #2. Antidiarrheals p.r.n.  Problem # 8:  abdominal pain (787.07) vague intermittent sharp and short-lived upper abdominal discomfort. Nonspecific.  Plan #1. Reassurance provided. Observe for significant change.    GREATER THEN 40 MINUTES WAS SPENT COUNSELING THIS PATIENT TODAY  Patient Instructions: 1)  Samples of Nexium given to patient to take 1 by mouth once daily 2)  Please schedule a follow-up appointment in 6 months.  3)  The medication list was reviewed and reconciled.  All changed / newly prescribed medications were explained.  A complete medication list was provided to the patient / caregiver. 4)  Copy: Dr. Trinna Post Plotnikov, Dr. Arlan Organ

## 2010-11-04 NOTE — Miscellaneous (Signed)
Summary: MOVIPREP  Clinical Lists Changes  Medications: Added new medication of MOVIPREP 100 GM  SOLR (PEG-KCL-NACL-NASULF-NA ASC-C) As per prep instructions. - Signed Rx of MOVIPREP 100 GM  SOLR (PEG-KCL-NACL-NASULF-NA ASC-C) As per prep instructions.;  #1 x 0;  Signed;  Entered by: Lowry Ram NCMA;  Authorized by: Willette Cluster NP;  Method used: Telephoned to [facility]*, [address], [place], [place], [place]  [address], Ph: [phone], Fax: [phone]    Prescriptions: MOVIPREP 100 GM  SOLR (PEG-KCL-NACL-NASULF-NA ASC-C) As per prep instructions.  #1 x 0   Entered by:   Lowry Ram NCMA   Authorized by:   Willette Cluster NP   Signed by:   Lowry Ram NCMA on 01/07/2010   Method used:   Telephoned to ...       [facility]* (retail)       [address]       [place], [place]  [address]       Ph: [phone]       Fax: [phone]   RxID:   [id number]

## 2010-11-04 NOTE — Progress Notes (Signed)
Summary: work in  Phone Note Other Incoming   Caller: Dietitian of Call: pt was sent for colonoscopy, she has had dropping hgb. Pt is feeling very weak and fatigued. I spoke with Colman Cater NP at Dr Myna Hidalgo. She felt as if pt needed to be seen pretty soon. I worked her in, in the morning. OK Initial call taken by: Ami Bullins CMA,  January 29, 2010 4:16 PM  Follow-up for Phone Call        Thanks! Follow-up by: Tresa Garter MD,  January 30, 2010 7:50 AM

## 2010-11-04 NOTE — Assessment & Plan Note (Signed)
Summary: 9:45 appt/eval. weakness/fatigue per Dr. Claris Gower   Vital Signs:  Patient profile:   77 year old female Height:      65 inches (165.10 cm) Weight:      138.75 pounds (63.07 kg) BMI:     23.17 O2 Sat:      95 % on Room air Temp:     97.7 degrees F (36.50 degrees C) oral Pulse rate:   84 / minute Pulse rhythm:   regular BP sitting:   122 / 72  (left arm) Cuff size:   regular  Vitals Entered By: Brenton Grills (January 30, 2010 9:54 AM)  O2 Flow:  Room air CC: pt here for evaluation per Dr. Alferd Apa for weakness and fatigue/pt states she has no energy/pt recently diagnosed with cirrhosis of the liver/aj   Primary Care Provider:  Sula Soda, MD   CC:  pt here for evaluation per Dr. Alferd Apa for weakness and fatigue/pt states she has no energy/pt recently diagnosed with cirrhosis of the liver/aj.  History of Present Illness: The patient presents for a follow up of hypertension, diabetes, hyperlipidemia, fatigue and cirrhosis. She insists on seeing a GI specialist at [facility] for a 2nd opinion. She would like to be treated for liver cirrhosis.   Current Medications (verified): 1)  Plavix 75 Mg  Tabs (Clopidogrel Bisulfate) .... Take 1 Tablet By Mouth Once A Day 2)  Potassium Chloride Cr 10 Meq  Cpcr (Potassium Chloride) .... 2 Once Daily 3)  Klonopin 0.5 Mg Tabs (Clonazepam) .... 2 Tablet By Mouth  At Bedtime 4)  Accu-Chek Compact  Strp (Glucose Blood) .... Once Daily-Bid 5)  Zovirax 5 % Crea (Acyclovir) .... Use Q 2 H X5 D For Cold Sores 6)  Dilantin 100 Mg Caps (Phenytoin Sodium Extended) .... 2 By Mouth Qhs 7)  Proair Hfa 108 (90 Base) Mcg/act Aers (Albuterol Sulfate) .... 2 Puffs Four Times A Day As Needed 8)  Lexapro 20 Mg Tabs (Escitalopram Oxalate) .... One Tablet By Mouth Once Daily At [facility] 9)  Glimepiride 1 Mg Tabs (Glimepiride) .Marland Kitchen.. 1 Tab By Mouth Bid 10)  Vitamin D 1000 Unit Tabs (Cholecalciferol) .Marland Kitchen.. 1 By Mouth Qd 11)  Ibuprofen 600 Mg Tabs (Ibuprofen) .Marland Kitchen.. 1  By Mouth Bid  Pc X 1 Wk Then As Needed For  Pain 12)  Nebulizer Compressor  Misc (Nebulizers) 13)  Questran 4 Gm Pack (Cholestyramine) .... Use One Packet Two Times A Day 14)  Infed 50 Mg/ml Soln (Iron Dextran) .... [facility] 15)  Ferrous Sulfate 325 (65 Fe) Mg  Tabs (Ferrous Sulfate) .... One By Mouth Two Times A Day Take Indefinitely 16)  Proair Hfa 108 (90 Base) Mcg/act Aers (Albuterol Sulfate) .... 2 Puffs Four Times A Day As Needed Rescue Inhaler  Allergies (verified): 1)  ! Penicillin 2)  Mevacor 3)  Amaryl (Glimepiride) 4)  Metformin Hcl  Comments:  Nurse/Medical Assistant: The patient's medications were reviewed with the patient and were updated in the Medication List. Lucious Groves (January 30, 2010 9:55 AM) Brenton Grills  Past History:  Past Medical History: Last updated: 12/30/2009 IRRITABLE BOWEL SYNDROME (ICD-564.1) Dr [name] DIABETES MELLITUS, TYPE II (ICD-250.00) TRANSIENT ISCHEMIC ATTACK, HX OF (ICD-V12.50) Dr Pearlean Brownie BRONCHITIS (ICD-490) TOBACCO ABUSE (ICD-305.1) ATHEROSCLEROSIS (ICD-440.9) INSOMNIA, CHRONIC (ICD-307.42 ADENOCARCINOMA, COLON (ICD-153.9) 2008S/P RIGHT HEMICOLECTOMY PERIPHERAL VASCULAR DISEASE (ICD-443.9) DEPRESSION (ICD-311) ANXIETY (ICD-300.00)    Dr Donell Beers RENAL CYST, LEFT (ICD-593.2) SLEEP RELATED MOVEMENT DISORDER UNSPECIFIED (ICD-780.58) CIRRHOSIS   Seizure disorder 2009  Dr Pearlean Brownie GYN Dr Tenny Craw  Family History: Last updated: 12/30/2009 Family History Hypertension Family History of Colon Cancer: Paternal Grandmother Family History of Esophageal Cancer: brother  Social History: Last updated: 12/30/2009 2-3 cigs/ day- rare, occasional- encouraged to stop Retired Divorced with children Current Smoker Occupation: Retired Alcohol use-never Drug use-never Regular exercise-yes chair yoga 2010 Daily Caffeine Use  Review of Systems       The patient complains of dyspnea on exertion, muscle weakness, and depression.   The patient denies anorexia, fever, weight loss, weight gain, vision loss, decreased hearing, hoarseness, chest pain, syncope, peripheral edema, prolonged cough, headaches, hemoptysis, abdominal pain, melena, hematochezia, severe indigestion/heartburn, hematuria, incontinence, genital sores, suspicious skin lesions, transient blindness, difficulty walking, unusual weight change, abnormal bleeding, enlarged lymph nodes, angioedema, and breast masses.         Tired  Physical Exam  General:  Well developed, well nourished, no acute distress. Head:  Normocephalic and atraumatic without obvious abnormalities. No apparent alopecia or balding. Eyes:  No corneal or conjunctival inflammation noted. EOMI. Perrla Ears:  External ear exam shows no significant lesions or deformities.  Otoscopic examination reveals clear canals, tympanic membranes are intact bilaterally without bulging, retraction, inflammation or discharge. Hearing is grossly normal bilaterally. Nose:  No deformity, discharge,  or lesions. Mouth:  Oral mucosa and oropharynx without lesions or exudates.  Teeth in good repair. Neck:  No deformities, masses, or tenderness noted. Chest Wall:  No deformities, masses, or tenderness noted. Lungs:  Clear throughout to auscultation. Heart:  Regular rate and rhythm; no murmurs, rubs,  or bruits. Abdomen:  Soft, nontender and nondistended. No masses, hepatosplenomegaly or hernias noted. Normal bowel sounds. Msk:  Some stiffness in LS spine is present Pulses:  R and L carotid,radial,femoral,dorsalis pedis and posterior tibial pulses are full and equal bilaterally Extremities:  No clubbing, cyanosis, edema, or deformity noted with normal full range of motion of all joints.  No edema Neurologic:  Alert and  oriented x4;   Skin:  left medial ankle region with mild soft tissue swelling but much improved erythema (cellulitis) within demarcated purple line drawn in ED last night - not warm to tough - no open  ulcerations or fluculence Cervical Nodes:  No lymphadenopathy noted Inguinal Nodes:  No significant adenopathy Psych:  Alert and cooperative. not anxious appearing, not suicidal, some depressed affect.     Impression & Recommendations:  Problem # 1:  FATIGUE (ICD-780.79) multifactorial (deconditioning, meds, depression, chronic illness) Assessment Unchanged We had a discussion. I told her that in the future she may not be able to feel any better than she is feeling now. I asked her to join [employer] "Silver sneakers". I do not think her liver problem is making her tired.  Problem # 2:  CIRRHOSIS (ICD-571.5) Assessment: Unchanged  Orders: Gastroenterology Referral (GI)  Problem # 3:  DIABETES MELLITUS, TYPE II (ICD-250.00) Assessment: Unchanged  Her updated medication list for this problem includes:    Glimepiride 1 Mg Tabs (Glimepiride) .Marland Kitchen... 1 tab by mouth bid  Problem # 4:  ANEMIA-B12 DEFICIENCY (ICD-281.1) Assessment: Comment Only On prescription/OTC  therapy  Her updated medication list for this problem includes:    Infed 50 Mg/ml Soln (Iron dextran) .Marland Kitchen... As needed at the cancer center    Ferrous Sulfate 325 (65 Fe) Mg Tabs (Ferrous sulfate) ..... One by mouth two times a day take indefinitely  Problem # 5:  SEIZURE DISORDER (ICD-780.39) Assessment: Unchanged  Her updated medication list for this problem includes:  Klonopin 0.5 Mg Tabs (Clonazepam) .Marland Kitchen... 2 tablet by mouth  at bedtime    Dilantin 100 Mg Caps (Phenytoin sodium extended) .Marland Kitchen... 2 by mouth qhs  Problem # 6:  DEPRESSION (ICD-311) Assessment: Unchanged  Her updated medication list for this problem includes:    Klonopin 0.5 Mg Tabs (Clonazepam) .Marland Kitchen... 2 tablet by mouth  at bedtime    Lexapro 20 Mg Tabs (Escitalopram oxalate) ..... One tablet by mouth once daily at hs  Complete Medication List: 1)  Plavix 75 Mg Tabs (Clopidogrel bisulfate) .... Take 1 tablet by mouth once a day 2)  Potassium Chloride Cr 10 Meq  Cpcr (Potassium chloride) .... 2 once daily 3)  Klonopin 0.5 Mg Tabs (Clonazepam) .... 2 tablet by mouth  at bedtime 4)  Accu-chek Compact Strp (Glucose blood) .... Once daily-bid 5)  Zovirax 5 % Crea (Acyclovir) .... Use q 2 h x5 d for cold sores 6)  Dilantin 100 Mg Caps (Phenytoin sodium extended) .... 2 by mouth qhs 7)  Proair Hfa 108 (90 Base) Mcg/act Aers (Albuterol sulfate) .... 2 puffs four times a day as needed 8)  Lexapro 20 Mg Tabs (Escitalopram oxalate) .... One tablet by mouth once daily at hs 9)  Glimepiride 1 Mg Tabs (Glimepiride) .Marland Kitchen.. 1 tab by mouth bid 10)  Vitamin D 1000 Unit Tabs (Cholecalciferol) .Marland Kitchen.. 1 by mouth qd 11)  Ibuprofen 600 Mg Tabs (Ibuprofen) .Marland Kitchen.. 1 by mouth bid  pc x 1 wk then as needed for  pain 12)  Nebulizer Compressor Misc (Nebulizers) 13)  Questran 4 Gm Pack (Cholestyramine) .... Use one packet two times a day 14)  Infed 50 Mg/ml Soln (Iron dextran) .... As needed at the cancer center 15)  Ferrous Sulfate 325 (65 Fe) Mg Tabs (Ferrous sulfate) .... One by mouth two times a day take indefinitely 16)  Proair Hfa 108 (90 Base) Mcg/act Aers (Albuterol sulfate) .... 2 puffs four times a day as needed rescue inhaler  Patient Instructions: 1)  Please schedule a follow-up appointment in 2 months. 2)  BMP prior to visit, ICD-9: 3)  Hepatic Panel prior to visit, ICD-9: 4)  Lipid Panel prior to visit, ICD-9: 250.00 5)  Vit B12 782.0 6)  TSH prior to visit, ICD-9: 7)  CBC w/ Diff prior to visit, ICD-9: 8)  HbgA1C prior to visit, ICD-9:

## 2010-11-04 NOTE — Miscellaneous (Signed)
Summary: [facility]   Imported By: Sherian Rein 08/26/2010 08:12:28  _____________________________________________________________________  External Attachment:    Type:   Image     Comment:   External Document

## 2010-11-04 NOTE — Assessment & Plan Note (Signed)
Summary: SHELF FELL ON WRIST-LB   Vital Signs:  Patient profile:   77 year old female Height:      65 inches Weight:      145 pounds BMI:     24.22 Temp:     98.3 degrees F oral Pulse rate:   76 / minute Pulse rhythm:   regular Resp:     16 per minute BP sitting:   140 / 70  (left arm) Cuff size:   regular  Vitals Entered By: Lanier Prude, CMA(AAMA) (August 08, 2010 4:05 PM) CC: shelf fell on pt 's Rt  arm mon. Now c/o pain in wrist and fingers Is Patient Diabetic? Yes   Primary Care Provider:  Sula Soda, MD   CC:  shelf fell on pt 's Rt  arm mon. Now c/o pain in wrist and fingers.  History of Present Illness: A small shelf fell on her wrist at [place] on Mon. C/o pain in R wrist and R forearm , her fingers hurt when she is using her R hand C/o elev glu F/u TIA, OA  Current Medications (verified): 1)  Plavix 75 Mg  Tabs (Clopidogrel Bisulfate) .... Take 1 Tablet By Mouth Once A Day 2)  Potassium Chloride Cr 10 Meq  Cpcr (Potassium Chloride) .... 2 Once Daily 3)  Klonopin 0.5 Mg Tabs (Clonazepam) .... 2 Tablet By Mouth  At Bedtime 4)  Accu-Chek Compact  Strp (Glucose Blood) .... Once Daily-Bid 5)  Dilantin 100 Mg Caps (Phenytoin Sodium Extended) .... 2 By Mouth Qhs 6)  Lexapro 20 Mg Tabs (Escitalopram Oxalate) .... One Tablet By Mouth Once Daily At [facility] 7)  Glimepiride 1 Mg Tabs (Glimepiride) .Marland Kitchen.. 1 Tab By Mouth Bid 8)  Vitamin D 1000 Unit Tabs (Cholecalciferol) .Marland Kitchen.. 1 By Mouth Qd 9)  Ibuprofen 600 Mg Tabs (Ibuprofen) .Marland Kitchen.. 1 By Mouth Bid  Pc X 1 Wk Then As Needed For  Pain 10)  Infed 50 Mg/ml Soln (Iron Dextran) .... [facility] 11)  Ferrous Sulfate 325 (65 Fe) Mg  Tabs (Ferrous Sulfate) .... One By Mouth Two Times A Day Take Indefinitely 12)  Proair Hfa 108 (90 Base) Mcg/act Aers (Albuterol Sulfate) .... 2 Puffs Four Times A Day As Needed Rescue Inhaler 13)  Neurontin 300 Mg Caps (Gabapentin) .... Take 1 Capsule By Mouth Once Daily 14)   Symbicort 80-4.5 Mcg/act Aero (Budesonide-Formoterol Fumarate) .... 2 Puffs and Rinse Mouth, Twice Daily  Allergies (verified): 1)  ! Penicillin 2)  Mevacor 3)  Metformin Hcl  Past History:  Past Medical History: Last updated: 04/22/2010 AS: moderate by echo 6/11 gradient 20/36 mmHg for mean and peak TRANSIENT ISCHEMIC ATTACK Dr Pearlean Brownie PERIPHERAL VASCULAR DISEASE  SYSTOLIC MURMUR  ADENOCARCINOMA, COLON 2008S/P RIGHT HEMICOLECTOMY IRRITABLE BOWEL SYNDROME  Dr [name] DIABETES MELLITUS, TYPE II  BRONCHITIS  ATHEROSCLEROSIS DEPRESSION  ANXIETY    Dr Donell Beers RENAL CYST, LEFT  SLEEP RELATED MOVEMENT DISORDER UNSPECIFIED  CIRRHOSIS AORTIC Stenosis- moderate, ECHO 2011  Seizure disorder 2009 Dr Pearlean Brownie GYN Dr Tenny Craw ANEMIA, IRON DEFICIENCY  CELLULITIS, ANKLE, LEFT  BREAST PAIN  ABNORMAL FINDINGS GI TRACT PERSONAL HX COLONIC POLYPS  DIVERTICULOSIS-COLON  PERSONAL HX COLON CANCER BELCHING  INFECTIOUS DIARRHEA SINUSITIS, ACUTE COPD  ABNORMAL LIVER FUNCTION TESTS  THRUSH FATIGUE  GERD BRONCHITIS ATHEROSCLEROSIS  INSOMNIA, CHRONIC RENAL CYST, LEFT   Social History: Last updated: 02/25/2010 2-3 cigs/ day- rare, occasional- encouraged to stop Divorced with children Occupation: Retired Alcohol use-never Drug use-never Regular exercise-yes chair  yoga 2010 Daily Caffeine Use  Review of Systems  The patient denies fever.    Physical Exam  General:  Well developed, well nourished, no acute distress. Mouth:  Oral mucosa and oropharynx without lesions or exudates.  Teeth in good repair. Lungs:  Clear throughout to auscultation. Heart:  Regular rate and rhythm; no murmurs, rubs,  or bruits. Msk:  R wrist is tender w/ROM Neurologic:  Alert and  oriented x4;   Skin:  AKs L ear brown growth - large   Impression & Recommendations:  Problem # 1:  WRIST PAIN (EAV-409.81) R contusion Assessment New  Orders: Ace Wraps 3-5 in/yard  (X9147) T-Wrist Comp Right  (73110TC)  Problem # 2:  PERIPHERAL VASCULAR DISEASE (ICD-443.9) Assessment: Unchanged On the regimen of medicine(s) reflected in the chart    Problem # 3:  DIABETES MELLITUS, TYPE II (ICD-250.00) Assessment: Unchanged  Her updated medication list for this problem includes:    Glimepiride 1 Mg Tabs (Glimepiride) .Marland Kitchen... 1 tab by mouth bid  Labs Reviewed: Creat: 0.7 (05/28/2010)    Reviewed HgBA1c results: 6.5 (03/20/2010)  6.0 (11/08/2008)  Complete Medication List: 1)  Plavix 75 Mg Tabs (Clopidogrel bisulfate) .... Take 1 tablet by mouth once a day 2)  Potassium Chloride Cr 10 Meq Cpcr (Potassium chloride) .... 2 once daily 3)  Klonopin 0.5 Mg Tabs (Clonazepam) .... 2 tablet by mouth  at bedtime 4)  Accu-chek Compact Strp (Glucose blood) .... Once daily-bid 5)  Dilantin 100 Mg Caps (Phenytoin sodium extended) .... 2 by mouth qhs 6)  Lexapro 20 Mg Tabs (Escitalopram oxalate) .... One tablet by mouth once daily at hs 7)  Glimepiride 1 Mg Tabs (Glimepiride) .Marland Kitchen.. 1 tab by mouth bid 8)  Vitamin D 1000 Unit Tabs (Cholecalciferol) .Marland Kitchen.. 1 by mouth qd 9)  Ibuprofen 600 Mg Tabs (Ibuprofen) .Marland Kitchen.. 1 by mouth bid  pc x 1 wk then as needed for  pain 10)  Infed 50 Mg/ml Soln (Iron dextran) .... As needed at the cancer center 11)  Ferrous Sulfate 325 (65 Fe) Mg Tabs (Ferrous sulfate) .... One by mouth two times a day take indefinitely 12)  Proair Hfa 108 (90 Base) Mcg/act Aers (Albuterol sulfate) .... 2 puffs four times a day as needed rescue inhaler 13)  Neurontin 300 Mg Caps (Gabapentin) .... Take 1 capsule by mouth once daily 14)  Symbicort 80-4.5 Mcg/act Aero (Budesonide-formoterol fumarate) .... 2 puffs and rinse mouth, twice daily 15)  Pennsaid 1.5 % Soln (Diclofenac sodium) .... 3-5 gtt on skin three times a day for pain  Patient Instructions: 1)  Please schedule a follow-up appointment in 2 weeks. 2)  BMP prior to visit, ICD-9: 3)  HbgA1C prior to visit, ICD-9: 4)  dilantin 250.00  995.20 Prescriptions: NEURONTIN 300 MG CAPS (GABAPENTIN) Take 1 capsule by mouth once daily  #90 x 3   Entered and Authorized by:   Tresa Garter MD   Signed by:   Tresa Garter MD on 08/08/2010   Method used:   Print then Give to Patient   RxID:   [id number] GLIMEPIRIDE 1 MG TABS (GLIMEPIRIDE) 1 tab by mouth bid  #180 x 3   Entered and Authorized by:   Tresa Garter MD   Signed by:   Tresa Garter MD on 08/08/2010   Method used:   Print then Give to Patient   RxID:   [id number] DILANTIN 100 MG CAPS (PHENYTOIN SODIUM EXTENDED) 2 by mouth qhs  #180 x 3  Entered and Authorized by:   Tresa Garter MD   Signed by:   Tresa Garter MD on 08/08/2010   Method used:   Print then Give to Patient   RxID:   [id number] POTASSIUM CHLORIDE CR 10 MEQ  CPCR (POTASSIUM CHLORIDE) 2 once daily  #180 x 3   Entered and Authorized by:   Tresa Garter MD   Signed by:   Tresa Garter MD on 08/08/2010   Method used:   Print then Give to Patient   RxID:   [id number] PLAVIX 75 MG  TABS (CLOPIDOGREL BISULFATE) Take 1 tablet by mouth once a day  #90 x 3   Entered and Authorized by:   Tresa Garter MD   Signed by:   Tresa Garter MD on 08/08/2010   Method used:   Print then Give to Patient   RxID:   [id number] PENNSAID 1.5 % SOLN (DICLOFENAC SODIUM) 3-5 gtt on skin three times a day for pain  #1 x 3   Entered and Authorized by:   Tresa Garter MD   Signed by:   Tresa Garter MD on 08/08/2010   Method used:   Print then Give to Patient   RxID:   [id number]    Orders Added: 1)  Ace Wraps 3-5 in/yard  [P2951] 2)  T-Wrist Comp Right [73110TC] 3)  Est. Patient Level IV [88416]

## 2010-11-04 NOTE — Assessment & Plan Note (Signed)
Summary: 2 wk f/u #/cd   Vital Signs:  Patient profile:   77 year old female Height:      65 inches Weight:      146 pounds BMI:     24.38 Temp:     99.1 degrees F oral Pulse rate:   96 / minute Pulse rhythm:   regular Resp:     16 per minute BP sitting:   140 / 60  (left arm) Cuff size:   regular  Vitals Entered By: Lanier Prude, Beverly Gust) (August 22, 2010 3:57 PM) CC: 2 wk f/u skin lesions removed from face Is Patient Diabetic? No   Primary Care Provider:  Sula Soda, MD   CC:  2 wk f/u skin lesions removed from face.  History of Present Illness: The patient presents for a follow up of her R wrist pain - better; pain when doing something is still there She came for skin biopsy of a few lesions on her face, however she refused biopsy and wanted me to freeze the lesions. I informed her of the possibilities of incomplete procedure and suboptimal results.  Current Medications (verified): 1)  Plavix 75 Mg  Tabs (Clopidogrel Bisulfate) .... Take 1 Tablet By Mouth Once A Day 2)  Potassium Chloride Cr 10 Meq  Cpcr (Potassium Chloride) .... 2 Once Daily 3)  Klonopin 0.5 Mg Tabs (Clonazepam) .... 2 Tablet By Mouth  At Bedtime 4)  Accu-Chek Compact  Strp (Glucose Blood) .... Once Daily-Bid 5)  Dilantin 100 Mg Caps (Phenytoin Sodium Extended) .... 2 By Mouth Qhs 6)  Lexapro 20 Mg Tabs (Escitalopram Oxalate) .... One Tablet By Mouth Once Daily At [facility] 7)  Glimepiride 1 Mg Tabs (Glimepiride) .Marland Kitchen.. 1 Tab By Mouth Bid 8)  Vitamin D 1000 Unit Tabs (Cholecalciferol) .Marland Kitchen.. 1 By Mouth Qd 9)  Ibuprofen 600 Mg Tabs (Ibuprofen) .Marland Kitchen.. 1 By Mouth Bid  Pc X 1 Wk Then As Needed For  Pain 10)  Infed 50 Mg/ml Soln (Iron Dextran) .... [facility] 11)  Ferrous Sulfate 325 (65 Fe) Mg  Tabs (Ferrous Sulfate) .... One By Mouth Two Times A Day Take Indefinitely 12)  Proair Hfa 108 (90 Base) Mcg/act Aers (Albuterol Sulfate) .... 2 Puffs Four Times A Day As Needed Rescue Inhaler 13)   Neurontin 300 Mg Caps (Gabapentin) .... Take 1 Capsule By Mouth Once Daily 14)  Symbicort 80-4.5 Mcg/act Aero (Budesonide-Formoterol Fumarate) .... 2 Puffs and Rinse Mouth, Twice Daily 15)  Pennsaid 1.5 % Soln (Diclofenac Sodium) .... 3-5 Gtt On Skin Three Times A Day For Pain  Allergies (verified): 1)  ! Penicillin 2)  Mevacor 3)  Metformin Hcl  Review of Systems  The patient denies fever, chest pain, and dyspnea on exertion.    Physical Exam  General:  Well developed, well nourished, no acute distress. Lungs:  Clear throughout to auscultation. Heart:  Regular rate and rhythm; no murmurs, rubs,  or bruits. Skin:  wart on R upper eyelid and 4 AKs on nose (1) and face Psych:  Alert and cooperative. not anxious appearing   Impression & Recommendations:  Problem # 1:  WRIST PAIN (ICD-719.43) Assessment Improved On the regimen of medicine(s) reflected in the chart    Problem # 2:  WART, VIRAL (ICD-078.10) R eyelid Assessment: Deteriorated  Procedure: cryo Indication: wart Risks incl. scar(s), incomplete removal, ect.  and benefits discussed    1  lesion(s) on R eyelid was/were treated with liqid N2 in usual fasion.  Tolerated well. Compl. none. Wound care instructions given.   Orders: Wart Destruct <14 (17110)  Problem # 3:  ACTINIC KERATOSIS (ICD-702.0) - face Assessment: New  Procedure: cryo Indication: AK(s) Risks incl. scar(s), incomplete removal, ect.  and benefits discussed     4  lesion(s) on face was/were treated with liqid N2 in usual fasion.  Tolerated well. Compl. none. Wound care instructions given.  Orders: Cryotherapy/Destruction benign or premalignant lesion (1st lesion)  (17000) Cryotherapy/Destruction benign or premalignant lesion (2nd-14th lesions) (17003)  Problem # 4:  DIABETES MELLITUS, TYPE II (ICD-250.00) Assessment: Comment Only  Her updated medication list for this problem includes:    Glimepiride 1 Mg Tabs (Glimepiride) .Marland Kitchen... 1 tab by  mouth bid  Complete Medication List: 1)  Plavix 75 Mg Tabs (Clopidogrel bisulfate) .... Take 1 tablet by mouth once a day 2)  Potassium Chloride Cr 10 Meq Cpcr (Potassium chloride) .... 2 once daily 3)  Klonopin 0.5 Mg Tabs (Clonazepam) .... 2 tablet by mouth  at bedtime 4)  Accu-chek Compact Strp (Glucose blood) .... Once daily-bid 5)  Dilantin 100 Mg Caps (Phenytoin sodium extended) .... 2 by mouth qhs 6)  Lexapro 20 Mg Tabs (Escitalopram oxalate) .... One tablet by mouth once daily at hs 7)  Glimepiride 1 Mg Tabs (Glimepiride) .Marland Kitchen.. 1 tab by mouth bid 8)  Vitamin D 1000 Unit Tabs (Cholecalciferol) .Marland Kitchen.. 1 by mouth qd 9)  Ibuprofen 600 Mg Tabs (Ibuprofen) .Marland Kitchen.. 1 by mouth bid  pc x 1 wk then as needed for  pain 10)  Infed 50 Mg/ml Soln (Iron dextran) .... As needed at the cancer center 11)  Ferrous Sulfate 325 (65 Fe) Mg Tabs (Ferrous sulfate) .... One by mouth two times a day take indefinitely 12)  Proair Hfa 108 (90 Base) Mcg/act Aers (Albuterol sulfate) .... 2 puffs four times a day as needed rescue inhaler 13)  Neurontin 300 Mg Caps (Gabapentin) .... Take 1 capsule by mouth once daily 14)  Symbicort 80-4.5 Mcg/act Aero (Budesonide-formoterol fumarate) .... 2 puffs and rinse mouth, twice daily 15)  Pennsaid 1.5 % Soln (Diclofenac sodium) .... 3-5 gtt on skin three times a day for pain  Patient Instructions: 1)  Please schedule a follow-up appointment in 3 months. 2)  BMP prior to visit, ICD-9: 3)  Hepatic Panel prior to visit, ICD-9:250.00  995.20 4)  HbgA1C prior to visit, ICD-9:   Orders Added: 1)  Est. Patient Level III [16109] 2)  Cryotherapy/Destruction benign or premalignant lesion (1st lesion)  [17000] 3)  Cryotherapy/Destruction benign or premalignant lesion (2nd-14th lesions) [17003] 4)  Wart Destruct <14 [17110]

## 2010-11-04 NOTE — Progress Notes (Signed)
Summary: FYI  Phone Note Call from Patient Call back at Home Phone [phone]   Caller: Patient Call For: young Reason for Call: Talk to Nurse Summary of Call: pt would like to be referred to Dr. Charlton Haws for her cardiology work up. Initial call taken by: Eugene Gavia,  April 21, 2010 11:00 AM  Follow-up for Phone Call        Spoke with pt.  She states that Dr Eden Emms is the cardiologist that she would like to see. You had advised her that when she got this info you would make referal.  Follow-up by: Vernie Murders,  April 21, 2010 11:49 AM  Additional Follow-up for Phone Call Additional follow up Details #1::        OK Additional Follow-up by: Waymon Budge MD,  April 21, 2010 12:47 PM    Additional Follow-up for Phone Call Additional follow up Details #2::    appt to see dr Eden Emms 04/23/10@4 :30pm pt aware  Follow-up by: Oneita Jolly,  April 21, 2010 2:06 PM

## 2010-11-04 NOTE — Letter (Signed)
Summary: [facility]  [facility]   Imported By: Sherian Rein 12/25/2009 09:46:34  _____________________________________________________________________  External Attachment:    Type:   Image     Comment:   External Document

## 2010-11-04 NOTE — Progress Notes (Signed)
Summary: Refill--Potassium Chloride  Phone Note Refill Request Message from:  Fax from Pharmacy on Feb 17, 2010 8:49 AM  Refills Requested: Medication #1:  POTASSIUM CHLORIDE CR 10 MEQ  CPCR 2 once daily Initial call taken by: Lucious Groves,  Feb 17, 2010 8:49 AM    Prescriptions: POTASSIUM CHLORIDE CR 10 MEQ  CPCR (POTASSIUM CHLORIDE) 2 once daily  #60 x 6   Entered by:   Lucious Groves   Authorized by:   Tresa Garter MD   Signed by:   Lucious Groves on 02/17/2010   Method used:   Electronically to        [facility] (retail)       [address]       [place], [place]  [address]       Ph: [phone]       Fax: [phone]   RxID:   [id number]

## 2010-11-04 NOTE — Progress Notes (Signed)
Summary: Triage   Phone Note Other Incoming   Caller: Harriett Sine, NP   [facility]  [phone] Summary of Call: Would like to discuss this pt. Pt. is sch'd for colon 01-09-10 and would like to discuss having EDG Initial call taken by: Karna Christmas,  January 02, 2010 2:14 PM  Follow-up for Phone Call        Discussed with Dr.Crestina Strike and pt. had EGD 6 months ago and varices very tiny.He feels bleeding source is not varices.message left for Harriett Sine to call back.  Teryl Lucy RN  January 02, 2010 3:38 PM  Harriett Sine made aware of Dr.Lasheika Ortloff's recommendations. Follow-up by: Teryl Lucy RN,  January 03, 2010 9:45 AM

## 2010-11-04 NOTE — Letter (Signed)
Summary: [facility]  [facility]   Imported By: Sherian Rein 07/09/2010 14:38:47  _____________________________________________________________________  External Attachment:    Type:   Image     Comment:   External Document

## 2010-11-04 NOTE — Progress Notes (Signed)
Summary: results  Phone Note Call from Patient   Caller: Patient Summary of Call: pt expericing soreness from echco she had done yesterday. she would also like results if they are in Initial call taken by: Rickard Patience,  March 27, 2010 11:26 AM  Follow-up for Phone Call        pt would like results of echo done yesterday   Philipp Deputy [facility]  March 27, 2010 2:30 PM   Additional Follow-up for Phone Call Additional follow up Details #1::        Done Additional Follow-up by: Waymon Budge MD,  March 27, 2010 8:04 PM

## 2010-11-04 NOTE — Progress Notes (Signed)
Summary: symbicort rx  Phone Note Call from Patient Call back at Home Phone [phone]   Caller: Patient Call For: young Summary of Call: Need new rx for symbicort.//[facility] [place]  Initial call taken by: Darletta Moll,  July 23, 2010 2:58 PM  Follow-up for Phone Call        called and spoke with pt and she stated that she has the box there with her but has never had an rx for the symbicort---this med is not on her med list either---CY please advise if ok to send in an rx for this med.  thanks Randell Loop CMA  July 23, 2010 3:12 PM   Additional Follow-up for Phone Call Additional follow up Details #1::        Ok - I have put it on her med list. Please send. I will wrtie for monthly, but change to 3 months if needed that way.  Additional Follow-up by: Waymon Budge MD,  July 23, 2010 4:35 PM    Additional Follow-up for Phone Call Additional follow up Details #2::    Called, spoke with pt.  She was informed CY ok to fill symbicort.  Pt requesting 90 day rx.  Rx sent -- pt aware.    New/Updated Medications: SYMBICORT 80-4.5 MCG/ACT AERO (BUDESONIDE-FORMOTEROL FUMARATE) 2 puffs and rinse mouth, twice daily Prescriptions: SYMBICORT 80-4.5 MCG/ACT AERO (BUDESONIDE-FORMOTEROL FUMARATE) 2 puffs and rinse mouth, twice daily  #3 x prn   Entered by:   Gweneth Dimitri RN   Authorized by:   Waymon Budge MD   Signed by:   Gweneth Dimitri RN on 07/23/2010   Method used:   Electronically to        [facility] (retail)       [address]       [place], [place]  [address]       Ph: [phone]       Fax: [phone]   RxID:   [id number] SYMBICORT 80-4.5 MCG/ACT AERO (BUDESONIDE-FORMOTEROL FUMARATE) 2 puffs and rinse mouth, twice daily  #1 x prn   Entered by:   Waymon Budge MD   Authorized by:   Pulmonary Triage   Signed by:   Waymon Budge MD on 07/23/2010   Method used:   Historical   RxID:   [id number]

## 2010-11-04 NOTE — Letter (Signed)
Summary: [facility] Instructions  [facility]  [address] [place], [place] [address]   Phone: [phone]  Fax: [phone]       Monique Fleming    05/31/1929    MRN: [id number]        Procedure Day /Date:01-09-10     Arrival Time: 12:30 PM     Procedure Time: 1:30 PM     Location of Procedure:                    X   [facility] (4th Floor)                       PREPARATION FOR COLONOSCOPY WITH MOVIPREP   Starting 5 days prior to your procedure 01-04-10 do not eat nuts, seeds, popcorn, corn, beans, peas,  salads, or any raw vegetables.  Do not take any fiber supplements (e.g. Metamucil, Citrucel, and Benefiber).  THE DAY BEFORE YOUR PROCEDURE         DATE: 01-08-10  DAY: Wednesday  1.  Drink clear liquids the entire day-NO SOLID FOOD  2.  Do not drink anything colored red or purple.  Avoid juices with pulp.  No orange juice.  3.  Drink at least 64 oz. (8 glasses) of fluid/clear liquids during the day to prevent dehydration and help the prep work efficiently.  CLEAR LIQUIDS INCLUDE: Water Jello Ice Popsicles Tea (sugar ok, no milk/cream) Powdered fruit flavored drinks Coffee (sugar ok, no milk/cream) Gatorade Juice: apple, white grape, white cranberry  Lemonade Clear bullion, consomm, broth Carbonated beverages (any kind) Strained chicken noodle soup Hard Candy                             4.  In the morning, mix first dose of MoviPrep solution:    Empty 1 Pouch A and 1 Pouch B into the disposable container    Add lukewarm drinking water to the top line of the container. Mix to dissolve    Refrigerate (mixed solution should be used within 24 hrs)  5.  Begin drinking the prep at 5:00 p.m. The MoviPrep container is divided by 4 marks.   Every 15 minutes drink the solution down to the next mark (approximately 8 oz) until the full liter is complete.   6.  Follow completed prep with 16 oz of clear liquid of your choice (Nothing red or  purple).  Continue to drink clear liquids until bedtime.  7.  Before going to bed, mix second dose of MoviPrep solution:    Empty 1 Pouch A and 1 Pouch B into the disposable container    Add lukewarm drinking water to the top line of the container. Mix to dissolve    Refrigerate  THE DAY OF YOUR PROCEDURE      DATE: 01-09-10 KY[facility]:[id number]  Beginning at 8:30 AM  (5 hours before procedure):         1. Every 15 minutes, drink the solution down to the next mark (approx 8 oz) until the full liter is complete.  2. Follow completed prep with 16 oz. of clear liquid of your choice.    3. You may drink clear liquids until 11:30 AM (2 HOURS BEFORE PROCEDURE).   MEDICATION INSTRUCTIONS  Unless otherwise instructed, you should take regular prescription medications with a small sip of water   as early as possible the morning of your procedure.  Diabetic patients - see separate instructions.  We will contact you once Dr. Posey Rea contacts [place] regarding the Plavix medication.          OTHER INSTRUCTIONS  You will need a responsible adult at least 75 years of age to accompany you and drive you home.   This person must remain in the waiting room during your procedure.  Wear loose fitting clothing that is easily removed.  Leave jewelry and other valuables at home.  However, you may wish to bring a book to read or  an iPod/MP3 player to listen to music as you wait for your procedure to start.  Remove all body piercing jewelry and leave at home.  Total time from sign-in until discharge is approximately 2-3 hours.  You should go home directly after your procedure and rest.  You can resume normal activities the  day after your procedure.  The day of your procedure you should not:   Drive   Make legal decisions   Operate machinery   Drink alcohol   Return to work  You will receive specific instructions about eating, activities and medications before you leave.    The above  instructions have been reviewed and explained to me by   _______________________    I fully understand and can verbalize these instructions _____________________________ Date _________

## 2010-11-04 NOTE — Progress Notes (Signed)
Summary: sooner appt.   Phone Note Call from Patient Call back at Home Phone [phone]   Caller: Patient Call For: [name] Reason for Call: Talk to Nurse Summary of Call: Patient wants to be seen sooner than her appt 4-5 because her blood count has dropped and she states that's what happened when she was dx with colon cancer. Initial call taken by: Tawni Levy,  December 19, 2009 4:45 PM  Follow-up for Phone Call        Given appt. with NP for 12/30/09 as Dr.Perry is supervising Dr. Follow-up by: Teryl Lucy RN,  December 19, 2009 5:01 PM

## 2010-11-04 NOTE — Assessment & Plan Note (Signed)
Summary: rov/apc   Copy to:  n/a Primary Provider/Referring Provider:  Sula Soda, MD   CC:  Follow up.  History of Present Illness: 09/17/08-COPD, bronchitis Checked recently for breast lump deemed benign.  Breathing has been stable. Denies cough wheeze or changes. Got flu vac. Woke with stiff neck from sleep position and went to [facility] ER free standing- had LP and had CXR- OK. CXR 09/06/08- COPD, NAD.  03/18/09- COPD., bronchits Since last with me has worked with Dr [name] on colitis with hx Colon cancer. also had a seizure attributed questionably to Sudafed. All winter she says she dealt with bronchitis and allergies. Now in summer weather she says her breathing is good and she uses rescue inhaler only once per month. She would like it refilled. Denies cough, phlegm, wheeze, chest pain, palpitation. Denies sinus congestion, postnasal drip, sneezing. Has a new dog.  December 03, 2009- COPD, bronchitis More short of breath gradually. More cough productive of white foamy mucus. Blames some of this on 'allergies". Little sneeze or wheeze. GYN told her irregular heart rate but has not had EKG.  We reviewed CT from [facility]- CT chest showed emphysema and atherosclerosis.  She has home nebulizer with unknown med but hasn't been using it.    Current Medications (verified): 1)  Plavix 75 Mg  Tabs (Clopidogrel Bisulfate) .... Take 1 Tablet By Mouth Once A Day 2)  Potassium Chloride Cr 10 Meq  Cpcr (Potassium Chloride) .... 2 Once Daily 3)  Klonopin 0.5 Mg Tabs (Clonazepam) .... 2 Tablet By Mouth  At Bedtime 4)  Accu-Chek Compact  Strp (Glucose Blood) .... Once Daily-Bid 5)  Zovirax 5 % Crea (Acyclovir) .... Use Q 2 H X5 D For Cold Sores 6)  Dilantin 100 Mg Caps (Phenytoin Sodium Extended) .... 2 By Mouth Qhs 7)  Proair Hfa 108 (90 Base) Mcg/act Aers (Albuterol Sulfate) .... 2 Puffs Four Times A Day As Needed 8)  Lexapro 20 Mg Tabs (Escitalopram Oxalate) .... One Tablet By Mouth Once Daily At  [facility] 9)  Glimepiride 1 Mg Tabs (Glimepiride) .Marland Kitchen.. 1 Tab By Mouth Bid 10)  Vitamin D 1000 Unit Tabs (Cholecalciferol) .Marland Kitchen.. 1 By Mouth Qd 11)  Ibuprofen 600 Mg Tabs (Ibuprofen) .Marland Kitchen.. 1 By Mouth Bid  Pc X 1 Wk Then As Needed For  Pain 12)  Doxycycline Hyclate 100 Mg Caps (Doxycycline Hyclate) .... Take 1 Two Times A Day For 7 Days  Allergies (verified): 1)  ! Penicillin 2)  Mevacor 3)  Amaryl (Glimepiride) 4)  Metformin Hcl  Past History:  Past Surgical History: Last updated: 03/08/2009 Carotid endarterectomy EUS (01/07/2006 , and 04/22/2006) EGD (11/19/05) Right hemi-Colectomy   2008 Breast-Lumpectomy 11/09  Family History: Last updated: 03/08/2009 Family History Hypertension Family History of Colon Cancer: Grandmother  Social History: Last updated: 09/11/2009 2-3 cigs/ day- rare, occasional- encouraged to stop Retired Divorced with children Current Smoker Occupation: Retired Alcohol use-never Drug use-never Regular exercise-yes chair yoga 2010  Risk Factors: Exercise: yes (09/11/2009)  Risk Factors: Smoking Status: current (07/19/2009) Packs/Day: 0.5 (07/19/2009)  Past Medical History: IRRITABLE BOWEL SYNDROME (ICD-564.1) Dr [name] DIABETES MELLITUS, TYPE II (ICD-250.00) TRANSIENT ISCHEMIC ATTACK, HX OF (ICD-V12.50) Dr Pearlean Brownie BRONCHITIS (ICD-490) TOBACCO ABUSE (ICD-305.1) ATHEROSCLEROSIS (ICD-440.9) INSOMNIA, CHRONIC (ICD-307.42 ADENOCARCINOMA, COLON (ICD-153.9) 2008S/P RIGHT HEMICOLECTOMY PERIPHERAL VASCULAR DISEASE (ICD-443.9) DEPRESSION (ICD-311) ANXIETY (ICD-300.00)    Dr Donell Beers RENAL CYST, LEFT (ICD-593.2) SLEEP RELATED MOVEMENT DISORDER UNSPECIFIED (ICD-780.58) hepatic cirrhosis- non alcoholic   Seizure disorder 2009 Dr Pearlean Brownie GYN Dr Tenny Craw  Review of Systems      See HPI       The patient complains of dyspnea on exertion.  The patient denies anorexia, fever, weight loss, weight gain, vision loss, decreased hearing, hoarseness, chest pain, syncope,  prolonged cough, headaches, hemoptysis, and severe indigestion/heartburn.    Vital Signs:  Patient profile:   77 year old female Height:      65 inches Weight:      137.4 pounds BMI:     22.95 O2 Sat:      95 % on Room air Pulse rate:   98 / minute BP sitting:   110 / 66  (left arm)  Vitals Entered By: Reynaldo Minium CMA (December 03, 2009 4:10 PM)  O2 Sat at Rest %:  95% O2 Flow:  Room air CC: Follow up Comments Medications reviewed with patient Reynaldo Minium CMA  December 03, 2009 4:12 PM    Physical Exam  Additional Exam:  General: A/Ox3; pleasant and cooperative, NAD, gentle little lady SKIN: no rash, lesions NODES: no lymphadenopathy HEENT: [place]/AT, EOM- WNL, Conjuctivae- clear, PERRLA, TM-WNL, Nose- clear, Throat- clear and wnl,  NECK: Supple w/ fair ROM, JVD- none, normal carotid impulses w/o bruits Thyroid-  CHEST: mild expiratory wheeze HEART: RRR- regular to my exam, 2/6 systolic murmur or adjacent subclavian bruit high at aortic space ABDOMEN: Soft and nl; EAV:WUJW, nl pulses, no edema  NEURO: Grossly intact to observation      Impression & Recommendations:  Problem # 1:  COPD (ICD-496) Increased dyspnea with previous PFT showing significant reversible component. We can try to improve her dyspnea with a neb and depo here. She understands it will raise her glucose. I will use Xopenex to minimze tachypalpitation since she has had some of that. I don't think the dyspnea is from heart failure, but watch for anemia.  Medications Added to Medication List This Visit: 1)  Nebulizer Compressor Misc (Nebulizers)  Other Orders: Est. Patient Level III (11914) Admin of Therapeutic Inj  intramuscular or subcutaneous (78295) Depo- Medrol 80mg  (J1040) Nebulizer Tx (62130)  Patient Instructions: 1)  Please schedule a follow-up appointment in 1 month. 2)  Neb xop 1.25 3)  depo 80   Medication Administration  Injection # 1:    Medication: Depo- Medrol 80mg     Diagnosis:  COPD (ICD-496)    Route: SQ    Site: RUOQ gluteus    Exp Date: 08/2012    Lot #: [id number]    Mfr: Pharmacia    Patient tolerated injection without complications    Given by: Reynaldo Minium CMA (December 03, 2009 4:55 PM)  Medication # 1:    Medication: Xopenex 1.25mg     Diagnosis: COPD (ICD-496)    Dose: 1 vial    Route: inhaled    Exp Date: 06-2010    Lot #: [id number]    Mfr: Sepracor    Patient tolerated medication without complications    Given by: Rolene Course  Orders Added: 1)  Est. Patient Level III [96295] 2)  Admin of Therapeutic Inj  intramuscular or subcutaneous [96372] 3)  Depo- Medrol 80mg  [J1040] 4)  Nebulizer Tx [28413]

## 2010-11-04 NOTE — Assessment & Plan Note (Signed)
Summary: 3 MO ROV /NWS   Vital Signs:  Patient profile:   77 year old female Weight:      134 pounds BMI:     22.38 O2 Sat:      93 % on Room air Temp:     97.7 degrees F oral Pulse rate:   85 / minute Resp:     16 per minute BP sitting:   120 / 50  (left arm) Cuff size:   regular  Vitals Entered By: Waldron Labs, CMA(AAMA) (March 25, 2010 2:36 PM)  O2 Flow:  Room air CC: f/u  Comments pt states she is taking 1/2 glimepiride q am   Primary Care Provider:  Sula Soda, MD   CC:  f/u .  History of Present Illness: The patient presents for a follow up of hypertension, diabetes, hyperlipidemia, depression. Doing better.  Allergies (verified): 1)  ! Penicillin 2)  Mevacor 3)  Amaryl (Glimepiride) 4)  Metformin Hcl  Past History:  Past Medical History: Last updated: 12/30/2009 IRRITABLE BOWEL SYNDROME (ICD-564.1) Dr [name] DIABETES MELLITUS, TYPE II (ICD-250.00) TRANSIENT ISCHEMIC ATTACK, HX OF (ICD-V12.50) Dr Pearlean Brownie BRONCHITIS (ICD-490) TOBACCO ABUSE (ICD-305.1) ATHEROSCLEROSIS (ICD-440.9) INSOMNIA, CHRONIC (ICD-307.42 ADENOCARCINOMA, COLON (ICD-153.9) 2008S/P RIGHT HEMICOLECTOMY PERIPHERAL VASCULAR DISEASE (ICD-443.9) DEPRESSION (ICD-311) ANXIETY (ICD-300.00)    Dr Donell Beers RENAL CYST, LEFT (ICD-593.2) SLEEP RELATED MOVEMENT DISORDER UNSPECIFIED (ICD-780.58) CIRRHOSIS   Seizure disorder 2009 Dr Pearlean Brownie GYN Dr Tenny Craw  Past Surgical History: Last updated: 03/08/2009 Carotid endarterectomy EUS (01/07/2006 , and 04/22/2006) EGD (11/19/05) Right hemi-Colectomy   2008 Breast-Lumpectomy 11/09  Social History: Last updated: 02/25/2010 2-3 cigs/ day- rare, occasional- encouraged to stop Divorced with children Occupation: Retired Alcohol use-never Drug use-never Regular exercise-yes chair yoga 2010 Daily Caffeine Use  Review of Systems       The patient complains of syncope, difficulty walking, and depression.  The patient denies fever, vision loss, chest pain,  abdominal pain, melena, and hematochezia.         SOB and tired a lot  Physical Exam  General:  Well developed, well nourished, no acute distress. Head:  Normocephalic and atraumatic without obvious abnormalities. No apparent alopecia or balding. Ears:  External ear exam shows no significant lesions or deformities.  Otoscopic examination reveals clear canals, tympanic membranes are intact bilaterally without bulging, retraction, inflammation or discharge. Hearing is grossly normal bilaterally. Mouth:  Oral mucosa and oropharynx without lesions or exudates.  Teeth in good repair. Neck:  No deformities, masses, or tenderness noted. Lungs:  Clear throughout to auscultation. Heart:  Regular rate and rhythm; no murmurs, rubs,  or bruits. Abdomen:  Soft, nontender and nondistended. No masses, hepatosplenomegaly or hernias noted. Normal bowel sounds. Msk:  Some stiffness in LS spine is present Neurologic:  Alert and  oriented x4;   Skin:  left medial ankle region with mild soft tissue swelling but much improved erythema (cellulitis) within demarcated purple line drawn in ED last night - not warm to tough - no open ulcerations or fluculence Psych:  Alert and cooperative. not anxious appearing, not suicidal, some depressed affect.     Impression & Recommendations:  Problem # 1:  DIABETES MELLITUS, TYPE II (ICD-250.00) Assessment New  Her updated medication list for this problem includes:    Glimepiride 1 Mg Tabs (Glimepiride) .Marland Kitchen... 1 tab by mouth bid  Problem # 2:  ANEMIA-B12 DEFICIENCY (ICD-281.1) Assessment: Unchanged  Her updated medication list for this problem includes:    Infed 50 Mg/ml Soln (Iron dextran) .Marland Kitchen... As  needed at the cancer center    Ferrous Sulfate 325 (65 Fe) Mg Tabs (Ferrous sulfate) ..... One by mouth two times a day take indefinitely  Problem # 3:  CIRRHOSIS (ICD-571.5) mild Assessment: Unchanged  Problem # 4:  SEIZURE DISORDER (ICD-780.39) Assessment: Comment  Only  Her updated medication list for this problem includes:    Klonopin 0.5 Mg Tabs (Clonazepam) .Marland Kitchen... 2 tablet by mouth  at bedtime    Dilantin 100 Mg Caps (Phenytoin sodium extended) .Marland Kitchen... 2 by mouth qhs  Complete Medication List: 1)  Plavix 75 Mg Tabs (Clopidogrel bisulfate) .... Take 1 tablet by mouth once a day 2)  Potassium Chloride Cr 10 Meq Cpcr (Potassium chloride) .... 2 once daily 3)  Klonopin 0.5 Mg Tabs (Clonazepam) .... 2 tablet by mouth  at bedtime 4)  Accu-chek Compact Strp (Glucose blood) .... Once daily-bid 5)  Zovirax 5 % Crea (Acyclovir) .... Use every 2 hours x 5 days for cold sores 6)  Dilantin 100 Mg Caps (Phenytoin sodium extended) .... 2 by mouth qhs 7)  Lexapro 20 Mg Tabs (Escitalopram oxalate) .... One tablet by mouth once daily at hs 8)  Glimepiride 1 Mg Tabs (Glimepiride) .Marland Kitchen.. 1 tab by mouth bid 9)  Vitamin D 1000 Unit Tabs (Cholecalciferol) .Marland Kitchen.. 1 by mouth qd 10)  Ibuprofen 600 Mg Tabs (Ibuprofen) .Marland Kitchen.. 1 by mouth bid  pc x 1 wk then as needed for  pain 11)  Questran 4 Gm Pack (Cholestyramine) .... Use one packet two times a day 12)  Infed 50 Mg/ml Soln (Iron dextran) .... As needed at the cancer center 13)  Ferrous Sulfate 325 (65 Fe) Mg Tabs (Ferrous sulfate) .... One by mouth two times a day take indefinitely 14)  Proair Hfa 108 (90 Base) Mcg/act Aers (Albuterol sulfate) .... 2 puffs four times a day as needed rescue inhaler 15)  Nexium 40 Mg Cpdr (Esomeprazole magnesium) .Marland Kitchen.. 1 by mouth once daily  Patient Instructions: 1)  Please schedule a follow-up appointment in 4 months. 2)  BMP prior to visit, ICD-9: 250.00 995.20 3)  Hepatic Panel prior to visit, ICD-9: 4)  HbgA1C prior to visit, ICD-9: 5)  Lipids 272.0 250.00 Prescriptions: GLIMEPIRIDE 1 MG TABS (GLIMEPIRIDE) 1 tab by mouth bid  #180 x 3   Entered and Authorized by:   Tresa Garter MD   Signed by:   Tresa Garter MD on 03/25/2010   Method used:   Faxed to ...       [facility]  [employer])             , [place]         Ph: [phone]       Fax: [phone]   RxID:   [id number] POTASSIUM CHLORIDE CR 10 MEQ  CPCR (POTASSIUM CHLORIDE) 2 once daily  #180 x 3   Entered and Authorized by:   Tresa Garter MD   Signed by:   Tresa Garter MD on 03/25/2010   Method used:   Faxed to ...       [facility] [employer])             , [place]         Ph: [phone]       Fax: [phone]   RxID:   [id number] PLAVIX 75 MG  TABS (CLOPIDOGREL BISULFATE) Take 1 tablet by mouth once a day  #90 x 3   Entered and Authorized by:   Tresa Garter MD  Signed by:   Tresa Garter MD on 03/25/2010   Method used:   Faxed to ...       [facility])             , [place]         Ph: [phone]       Fax: [phone]   RxID:   [id number]

## 2010-11-04 NOTE — Assessment & Plan Note (Signed)
Summary: 4-6 wk rov /nws   Vital Signs:  Patient profile:   77 year old female Weight:      163 pounds Temp:     97.9 degrees F oral Pulse rate:   101 / minute BP sitting:   142 / 60  (left arm)  Vitals Entered By: Tora Perches (October 09, 2009 1:22 PM) CC: f/u Is Patient Diabetic? No   Primary Care Provider:  Sula Soda, MD   CC:  f/u.  History of Present Illness: The patient presents for a follow up of hypertension, diabetes, hyperlipidemia. CBG>200s. C/o fatigue...   Current Medications (verified): 1)  Plavix 75 Mg  Tabs (Clopidogrel Bisulfate) .... Take 1 Tablet By Mouth Once A Day 2)  Potassium Chloride Cr 10 Meq  Cpcr (Potassium Chloride) .... 2 Once Daily 3)  Klonopin 0.5 Mg Tabs (Clonazepam) .... 2 Tablet By Mouth  At Bedtime 4)  Accu-Chek Compact  Strp (Glucose Blood) .... Once Daily-Bid 5)  Zovirax 5 % Crea (Acyclovir) .... Use Q 2 H X5 D For Cold Sores 6)  Dilantin 100 Mg Caps (Phenytoin Sodium Extended) .... 2 By Mouth Qhs 7)  Proair Hfa 108 (90 Base) Mcg/act Aers (Albuterol Sulfate) .... 2 Puffs Four Times A Day As Needed 8)  Lexapro 20 Mg Tabs (Escitalopram Oxalate) .... One Tablet By Mouth Once Daily At [facility] 9)  Citracal/vitamin D 250-200 Mg-Unit Tabs (Calcium Citrate-Vitamin D) .... One Tablet By Mouth Once Daily 10)  Glimepiride 1 Mg Tabs (Glimepiride) .... 1/2 or 1 Tab By Mouth Once Daily  Allergies: 1)  ! Penicillin 2)  Mevacor 3)  Amaryl (Glimepiride) 4)  Metformin Hcl  Past History:  Past Medical History: Last updated: 03/20/2009 IRRITABLE BOWEL SYNDROME (ICD-564.1) Dr [name] DIABETES MELLITUS, TYPE II (ICD-250.00) TRANSIENT ISCHEMIC ATTACK, HX OF (ICD-V12.50) Dr Pearlean Brownie BRONCHITIS (ICD-490) TOBACCO ABUSE (ICD-305.1) ATHEROSCLEROSIS (ICD-440.9) INSOMNIA, CHRONIC (ICD-307.42 ADENOCARCINOMA, COLON (ICD-153.9) 2008S/P RIGHT HEMICOLECTOMY PERIPHERAL VASCULAR DISEASE (ICD-443.9) DEPRESSION (ICD-311) ANXIETY (ICD-300.00)    Dr Donell Beers RENAL  CYST, LEFT (ICD-593.2) SLEEP RELATED MOVEMENT DISORDER UNSPECIFIED (ICD-780.58)   Seizure disorder 2009 Dr Pearlean Brownie GYN Dr Tenny Craw  Social History: Last updated: 09/11/2009 2-3 cigs/ day- rare, occasional- encouraged to stop Retired Divorced with children Current Smoker Occupation: Retired Alcohol use-never Drug use-never Regular exercise-yes chair yoga 2010  Review of Systems       The patient complains of dyspnea on exertion.  The patient denies fever, chest pain, syncope, and abdominal pain.    Physical Exam  General:  Well developed, well nourished, no acute distress. Nose:  No deformity, discharge,  or lesions. Mouth:  Oral mucosa and oropharynx without lesions or exudates.  Teeth in good repair. Lungs:  Clear throughout to auscultation. Heart:  Regular rate and rhythm; no murmurs, rubs,  or bruits. Abdomen:  Soft, nontender and nondistended. No masses, hepatosplenomegaly or hernias noted. Normal bowel sounds. Msk:  Symmetrical with no gross deformities. Normal posture. Neurologic:  Alert and  oriented x4;   Skin:  Intact without significant lesions or rashes. Psych:  Alert and cooperative. not anxious appearing, not suicidal, some depressed affect.     Impression & Recommendations:  Problem # 1:  FATIGUE (ICD-780.79) Assessment Unchanged  Problem # 2:  CIRRHOSIS (ICD-571.5) Assessment: Unchanged  Problem # 3:  SEIZURE DISORDER (ICD-780.39) Assessment: Comment Only  Her updated medication list for this problem includes:    Klonopin 0.5 Mg Tabs (Clonazepam) .Marland Kitchen... 2 tablet by mouth  at bedtime    Dilantin 100 Mg  Caps (Phenytoin sodium extended) .Marland Kitchen... 2 by mouth qhs  Problem # 4:  DIABETES MELLITUS, TYPE II (ICD-250.00) Assessment: Deteriorated  Her updated medication list for this problem includes:    Glimepiride 1 Mg Tabs (Glimepiride) .Marland Kitchen... 1 tab by mouth bid  Problem # 5:  DEPRESSION (ICD-311) Assessment: Unchanged  Her updated medication list for this  problem includes:    Klonopin 0.5 Mg Tabs (Clonazepam) .Marland Kitchen... 2 tablet by mouth  at bedtime    Lexapro 20 Mg Tabs (Escitalopram oxalate) ..... One tablet by mouth once daily at hs  Problem # 6:  BREAST PAIN (ICD-611.71) Assessment: Unchanged GYN is pending next wk  Complete Medication List: 1)  Plavix 75 Mg Tabs (Clopidogrel bisulfate) .... Take 1 tablet by mouth once a day 2)  Potassium Chloride Cr 10 Meq Cpcr (Potassium chloride) .... 2 once daily 3)  Klonopin 0.5 Mg Tabs (Clonazepam) .... 2 tablet by mouth  at bedtime 4)  Accu-chek Compact Strp (Glucose blood) .... Once daily-bid 5)  Zovirax 5 % Crea (Acyclovir) .... Use q 2 h x5 d for cold sores 6)  Dilantin 100 Mg Caps (Phenytoin sodium extended) .... 2 by mouth qhs 7)  Proair Hfa 108 (90 Base) Mcg/act Aers (Albuterol sulfate) .... 2 puffs four times a day as needed 8)  Lexapro 20 Mg Tabs (Escitalopram oxalate) .... One tablet by mouth once daily at hs 9)  Glimepiride 1 Mg Tabs (Glimepiride) .Marland Kitchen.. 1 tab by mouth bid 10)  Vitamin D 1000 Unit Tabs (Cholecalciferol) .Marland Kitchen.. 1 by mouth qd 11)  Fioricet 50-325-40 Mg Tabs (Butalbital-apap-caffeine) .Marland Kitchen.. 1-2 by mouth two times a day as needed migraine or tension ha  Patient Instructions: 1)  Please schedule a follow-up appointment in 2 months. 2)  Use coffee and tea for energy 3)  BMP prior to visit, ICD-9: 4)  Hepatic Panel prior to visit, ICD-9: 5)  Lipid Panel prior to visit, ICD-9: 6)  TSH prior to visit, ICD-9: 7)  CBC w/ Diff prior to visit, ICD-9: 8)  Urine-dip prior to visit, ICD-9: 9)  HbgA1C prior to visit, ICD-9: 10)  Vit B12 782.0  995.20 Prescriptions: FIORICET 50-325-40 MG TABS (BUTALBITAL-APAP-CAFFEINE) 1-2 by mouth two times a day as needed migraine or tension HA  #60 x 2   Entered and Authorized by:   Tresa Garter MD   Signed by:   Tresa Garter MD on 10/09/2009   Method used:   Print then Give to Patient   RxID:   [id number] GLIMEPIRIDE 1 MG TABS  (GLIMEPIRIDE) 1 tab by mouth bid  #60 x 3   Entered and Authorized by:   Tresa Garter MD   Signed by:   Tresa Garter MD on 10/09/2009   Method used:   Print then Give to Patient   RxID:   [id number] POTASSIUM CHLORIDE CR 10 MEQ  CPCR (POTASSIUM CHLORIDE) 2 once daily  #60 x 3   Entered and Authorized by:   Tresa Garter MD   Signed by:   Tresa Garter MD on 10/09/2009   Method used:   Print then Give to Patient   RxID:   [id number] PLAVIX 75 MG  TABS (CLOPIDOGREL BISULFATE) Take 1 tablet by mouth once a day  #30 x 3   Entered and Authorized by:   Tresa Garter MD   Signed by:   Tresa Garter MD on 10/09/2009   Method used:   Print then Give to Patient  RxID:   [id number] GLIMEPIRIDE 1 MG TABS (GLIMEPIRIDE) 1 tab by mouth bid  #180 x 3   Entered and Authorized by:   Tresa Garter MD   Signed by:   Tresa Garter MD on 10/09/2009   Method used:   Print then Give to Patient   RxID:   [id number] POTASSIUM CHLORIDE CR 10 MEQ  CPCR (POTASSIUM CHLORIDE) 2 once daily  #180 x 3   Entered and Authorized by:   Tresa Garter MD   Signed by:   Tresa Garter MD on 10/09/2009   Method used:   Print then Give to Patient   RxID:   [id number] PLAVIX 75 MG  TABS (CLOPIDOGREL BISULFATE) Take 1 tablet by mouth once a day  #90 x 3   Entered and Authorized by:   Tresa Garter MD   Signed by:   Tresa Garter MD on 10/09/2009   Method used:   Print then Give to Patient   RxID:   [id number]

## 2010-11-04 NOTE — Letter (Signed)
Summary: [facility]   Imported By: Lester [place] 10/23/2009 07:19:33  _____________________________________________________________________  External Attachment:    Type:   Image     Comment:   External Document

## 2010-11-04 NOTE — Procedures (Signed)
Summary: Colonoscopy  Patient: Monique Fleming Note: All result statuses are Final unless otherwise noted.  Tests: (1) Colonoscopy (COL)   COL Colonoscopy           DONE     [facility]     [address]     [place], [place]  [address]           COLONOSCOPY PROCEDURE REPORT           PATIENT:  [name], [name]  MR#:  [id number]     BIRTHDATE:  1929-01-25, 80 yrs. old  GENDER:  female     ENDOSCOPIST:  Wilhemina Bonito. Eda Keys, MD     REF. BY:  Arlan Organ, M.D.     PROCEDURE DATE:  01/09/2010     PROCEDURE:  Colonoscopy with snare polypectomy x 4     ASA CLASS:  Class III     INDICATIONS:  iron deficiency anemia ; increased CEA. Hx colon Ca;     last colon 6-09 w/ polyps only     MEDICATIONS:   Fentanyl 50 mcg IV, Versed 7 mg IV           DESCRIPTION OF PROCEDURE:   After the risks benefits and     alternatives of the procedure were thoroughly explained, informed     consent was obtained.  Digital rectal exam was performed and     revealed no abnormalities.   The LB [id number] endoscope     was introduced through the anus and advanced to the anastamosis,     without limitations.  The quality of the prep was excellent, using     MoviPrep.Time to anastomosis 10:02 min.  The instrument was then     slowly withdrawn (time = 9:56 min) as the colon was fully     examined.     <<PROCEDUREIMAGES>>           FINDINGS:  The right colon was surgically resected and an     ileo-colonic anastamosis was seen.  Several large nonbleeding A.V.     malformations were found in the ascending colon region.  Four     polyps were found - in the mid transverse colon (41mm,5mm) and     descending colon (93mm,5mm). Polyps were snared without cautery.     Retrieval was successful.    Retroflexed views in the rectum     revealed internal hemorrhoids and prominent rectal veins.    The     scope was then withdrawn from the patient and the procedure     completed.           COMPLICATIONS:  None  ENDOSCOPIC IMPRESSION:     1) Prior right hemi-colectomy     2) Av malformations in the ascending colon as a cause for iron     deficiency anemia     3) Four polyps (small) - removed     4) Internal hemorrhoids and prominent rectal veins     5) NO CANCER           RECOMMENDATIONS:     1) FeSO4 325mg ; #60; one po bid ; 11 refills - take indefinitely     2) Return to the care of your primary provider / Dr. Myna Hidalgo.     3)GI follow up as needed           ______________________________     Wilhemina Bonito. Eda Keys, MD  CC:  Arlan Organ, MD;The Patient           n.     eSIGNED:   Wilhemina Bonito. Eda Keys at 01/09/2010 02:19 PM           Fanny Dance, [phone]  Note: An exclamation mark (!) indicates a result that was not dispersed into the flowsheet. Document Creation Date: 01/09/2010 2:19 PM _______________________________________________________________________  (1) Order result status: Final Collection or observation date-time: 01/09/2010 14:06 Requested date-time:  Receipt date-time:  Reported date-time:  Referring Physician:   Ordering Physician: Fransico Setters [phone]) Specimen Source:  Source: Launa Grill Order Number: [id number] Lab site:

## 2010-11-04 NOTE — Letter (Signed)
Summary: Diabetic Instructions  [facility]  [address] [place], [place] [address]   Phone: [phone]  Fax: [phone]    Monique Fleming [birth year] MRN: [id number]    X    ORAL DIABETIC MEDICATION INSTRUCTIONS  The day before your procedure:   Take your diabetic pill as you do normally  The day of your procedure:   Do not take your diabetic pill    We will check your blood sugar levels during the admission process and again in Recovery before discharging you home    Appended Document: Diabetic Instructions given to patient

## 2010-11-04 NOTE — Letter (Signed)
Summary: Patient Notice- Polyp Results  [facility]  [address] [place], [place] [address]   Phone: [phone]  Fax: [phone]        January 13, 2010 MRN: [id number]    [facility] [address] [place], [place]  [address]    Dear Ms. Rainwater,  I am pleased to inform you that the colon polyp(s) removed during your recent colonoscopy was (were) found to be benign (no cancer detected) upon pathologic examination.    Additional information/recommendations:  __ No further action with gastroenterology is needed at this time. Please      follow-up with your primary care physician for your other healthcare      needs.   __ Continue treatment plan as outlined the day of your exam.    Please call us if you are having persistent problems or have questions about your condition that have not been fully answered at this time.  Sincerely,  Hilarie Fredrickson MD  This letter has been electronically signed by your physician.  Appended Document: Patient Notice- Polyp Results letter mailed 04.13.11

## 2010-11-04 NOTE — Progress Notes (Signed)
Summary: cxr results  Phone Note Call from Patient Call back at [phone]   Caller: Daughter//Monique Fleming Call For: Monique Fleming Summary of Call: Daughter wants results of cxr and she wants the results to come to her first. Initial call taken by: Darletta Moll,  January 15, 2010 10:06 AM  Follow-up for Phone Call        [email or web address]   [facility]  January 15, 2010 11:26 AM    [phone] Monique Fleming returing call Valinda Hoar  January 15, 2010 1:03 PM  daughter advised of cxr results per append. Carron Curie CMA  January 15, 2010 4:12 PM

## 2010-11-04 NOTE — Assessment & Plan Note (Signed)
Summary: ROV/ MBW   Copy to:  n/a Primary Provider/Referring Provider:  Sula Soda, MD   CC:  Follow up visit.  History of Present Illness: 03/18/09- COPD., bronchits Since last with me has worked with Dr [name] on colitis with hx Colon cancer. also had a seizure attributed questionably to Sudafed. All winter she says she dealt with bronchitis and allergies. Now in summer weather she says her breathing is good and she uses rescue inhaler only once per month. She would like it refilled. Denies cough, phlegm, wheeze, chest pain, palpitation. Denies sinus congestion, postnasal drip, sneezing. Has a new dog.  December 03, 2009- COPD, bronchitis More short of breath gradually. More cough productive of white foamy mucus. Blames some of this on 'allergies". Little sneeze or wheeze. GYN told her irregular heart rate but has not had EKG.  We reviewed [facility]- CT chest showed emphysema and atherosclerosis.  She has home nebulizer with unknown med but hasn't been using it.  January 14, 2010- COPD, bronchitis Breathing was ok during recent colonoscopy. Has been bleeding and elevated  CEA. With hx Right hemicolectomy for adenoca, she has had a lot of testing and been given intravenous iron. She hasn't needed her nebulizer, but needs refill with her rescue inhaler.She had been using Proair a couple of times every other day. Some cough- clear mucus. Some rapid heartbeat noted occasionally. Denies chest pain or fever. sometimes feet may swell a little. ......................................................... Need to clarify current smoking tsatus    Current Medications (verified): 1)  Plavix 75 Mg  Tabs (Clopidogrel Bisulfate) .... Take 1 Tablet By Mouth Once A Day 2)  Potassium Chloride Cr 10 Meq  Cpcr (Potassium Chloride) .... 2 Once Daily 3)  Klonopin 0.5 Mg Tabs (Clonazepam) .... 2 Tablet By Mouth  At Bedtime 4)  Accu-Chek Compact  Strp (Glucose Blood) .... Once Daily-Bid 5)  Zovirax 5 %  Crea (Acyclovir) .... Use Q 2 H X5 D For Cold Sores 6)  Dilantin 100 Mg Caps (Phenytoin Sodium Extended) .... 2 By Mouth Qhs 7)  Proair Hfa 108 (90 Base) Mcg/act Aers (Albuterol Sulfate) .... 2 Puffs Four Times A Day As Needed 8)  Lexapro 20 Mg Tabs (Escitalopram Oxalate) .... One Tablet By Mouth Once Daily At [facility] 9)  Glimepiride 1 Mg Tabs (Glimepiride) .Marland Kitchen.. 1 Tab By Mouth Bid 10)  Vitamin D 1000 Unit Tabs (Cholecalciferol) .Marland Kitchen.. 1 By Mouth Qd 11)  Ibuprofen 600 Mg Tabs (Ibuprofen) .Marland Kitchen.. 1 By Mouth Bid  Pc X 1 Wk Then As Needed For  Pain 12)  Nebulizer Compressor  Misc (Nebulizers) 13)  Questran 4 Gm Pack (Cholestyramine) .... Use One Packet Two Times A Day 14)  Infed 50 Mg/ml Soln (Iron Dextran) .... [facility] 15)  Ferrous Sulfate 325 (65 Fe) Mg  Tabs (Ferrous Sulfate) .... One By Mouth Two Times A Day Take Indefinitely  Allergies (verified): 1)  ! Penicillin 2)  Mevacor 3)  Amaryl (Glimepiride) 4)  Metformin Hcl  Past History:  Past Medical History: Last updated: 12/30/2009 IRRITABLE BOWEL SYNDROME (ICD-564.1) Dr [name] DIABETES MELLITUS, TYPE II (ICD-250.00) TRANSIENT ISCHEMIC ATTACK, HX OF (ICD-V12.50) Dr Pearlean Brownie BRONCHITIS (ICD-490) TOBACCO ABUSE (ICD-305.1) ATHEROSCLEROSIS (ICD-440.9) INSOMNIA, CHRONIC (ICD-307.42 ADENOCARCINOMA, COLON (ICD-153.9) 2008S/P RIGHT HEMICOLECTOMY PERIPHERAL VASCULAR DISEASE (ICD-443.9) DEPRESSION (ICD-311) ANXIETY (ICD-300.00)    Dr Donell Beers RENAL CYST, LEFT (ICD-593.2) SLEEP RELATED MOVEMENT DISORDER UNSPECIFIED (ICD-780.58) CIRRHOSIS   Seizure disorder 2009 Dr Pearlean Brownie GYN Dr Tenny Craw  Past Surgical History:  Last updated: 03/08/2009 Carotid endarterectomy EUS (01/07/2006 , and 04/22/2006) EGD (11/19/05) Right hemi-Colectomy   2008 Breast-Lumpectomy 11/09  Family History: Last updated: 12/30/2009 Family History Hypertension Family History of Colon Cancer: Paternal Grandmother Family History of Esophageal Cancer:  brother  Social History: Last updated: 12/30/2009 2-3 cigs/ day- rare, occasional- encouraged to stop Retired Divorced with children Current Smoker Occupation: Retired Alcohol use-never Drug use-never Regular exercise-yes chair yoga 2010 Daily Caffeine Use  Risk Factors: Exercise: yes (09/11/2009)  Risk Factors: Smoking Status: current (12/23/2009) Packs/Day: 0.5 (07/19/2009)  Review of Systems      See HPI  The patient denies anorexia, fever, weight loss, weight gain, vision loss, decreased hearing, hoarseness, chest pain, syncope, dyspnea on exertion, peripheral edema, prolonged cough, headaches, hemoptysis, abdominal pain, and severe indigestion/heartburn.    Vital Signs:  Patient profile:   77 year old female Height:      65 inches Weight:      135.13 pounds BMI:     22.57 O2 Sat:      96 % on Room air Pulse rate:   72 / minute BP sitting:   98 / 609  (left arm) Cuff size:   regular  Vitals Entered By: Reynaldo Minium CMA (January 14, 2010 2:33 PM)  O2 Flow:  Room air  Physical Exam  Additional Exam:  General: A/Ox3; pleasant and cooperative, NAD, gentle little lady SKIN: no rash, lesions NODES: no lymphadenopathy HEENT: [place]/AT, EOM- WNL, Conjuctivae- clear, PERRLA, TM-WNL, Nose- clear, Throat- clear and wnl, dentures, Mallampati  II NECK: Supple w/ fair ROM, JVD- none, normal carotid impulses w/o bruits Thyroid-  CHEST: expiratory wheeze especially left upper lobe, unlabored, no dullness. HEART: RRR- regular to my exam, 2/6 systolic murmur or adjacent subclavian bruit high at aortic space ABDOMEN: Soft and nl; AVW:UJWJ, nl pulses, no edema  NEURO: Grossly intact to observation      Impression & Recommendations:  Problem # 1:  COPD (ICD-496) Sustained wheeze left upper lobe. I will get CXR and refill her rescue inhaler.  Medications Added to Medication List This Visit: 1)  Proair Hfa 108 (90 Base) Mcg/act Aers (Albuterol sulfate) .... 2 puffs four times  a day as needed rescue inhaler  Other Orders: Est. Patient Level III (19147) T-2 View CXR (71020TC) Prescription Created Electronically [phone])  Patient Instructions: 1)  Please schedule a follow-up appointment in 6 months. 2)  A chest x-ray has been recommended.  Your imaging study may require preauthorization.  Prescriptions: PROAIR HFA 108 (90 BASE) MCG/ACT AERS (ALBUTEROL SULFATE) 2 puffs four times a day as needed rescue inhaler  #1 x prn   Entered and Authorized by:   Waymon Budge MD   Signed by:   Waymon Budge MD on 01/14/2010   Method used:   Electronically to        [facility]* (retail)       [address]       [place], [place]  [address]       Ph: [phone]       Fax: [phone]   RxID:   [id number]

## 2010-11-04 NOTE — Letter (Signed)
Summary: Anticoagulation Modification Letter  [facility]  [address] [place], [place] [address]   Phone: [phone]  Fax: [phone]    December 30, 2009  Re:    Monique Fleming DOB:    1928-12-27 MRN:    [id number]    Dear Plotnikov:   We have scheduled the above patient for an endoscopic procedure. Our records show that  she is on anticoagulation therapy. Please advise as to how long the patient may come off their therapy of Plavix prior to the scheduled procedure on 01-09-2010.    Please  route the completed form to [facility].  Thank you for your help with this matter.  Sincerely,  Lowry Ram   Physician Recommendation:  Hold Plavix 7 days prior ________________  Hold Coumadin 5 days prior ____________  Other ______________________________     Appended Document: Anticoagulation Modification Letter Spoke to Dr. Posey Rea, advised him this pt is having a Colonoscopy on 01-09-10.  I asked how many days he wanted the pt to hold the Plavix and he said 5 days would be fine.  I called the pt to let her know today 01-01-10.  Appended Document: Anticoagulation Modification Letter I told her to stop Plavix on 01-04-10 and resume it day after the procedure on 01-10-10.

## 2010-11-04 NOTE — Assessment & Plan Note (Signed)
Summary: POST ER CELLULITIS/PLOT/LB   Vital Signs:  Patient profile:   77 year old female Height:      65 inches (165.10 cm) Weight:      134.8 pounds (61.27 kg) O2 Sat:      95 % on Room air Temp:     98.1 degrees F (36.72 degrees C) oral Pulse rate:   83 / minute BP sitting:   152 / 58  (left arm) Cuff size:   regular  Vitals Entered By: Orlan Leavens (October 24, 2009 10:11 AM)  O2 Flow:  Room air CC: F/U from ER last night. Cellulitis is (L) ankle Is Patient Diabetic? Yes Did you bring your meter with you today? No Pain Assessment Patient in pain? no        Primary Care Provider:  Sula Soda, MD   CC:  F/U from ER last night. Cellulitis is (L) ankle.  History of Present Illness: [facility] - dx with cellulitis left ankle - (swelling and redness) labs and xray done - normal per pt report given abx in ED -  also given rx for oral doxy - has not yet filled/begun no fever ever, no injury - no other skin areas affected - no pain in ankle or foot line demarcating affected region drawn in purple marker last night -  redness much improved  -  Date:  10/23/2009    BG Random: 76    BUN: 12    Creatinine: .9    Sodium: 142    Potassium: 4.4    Chloride: 105    CO2 Total: 28    WBC: 8.5    HGB: 11.7    HCT: 33.9    RBC: 4.23    PLT: 216    MCV: 80.2    RDW: 14.5    Neutrophil: 58    Lymphs: 29    Monos: 9    Eos: 4    Basophil: 1  Current Medications (verified): 1)  Plavix 75 Mg  Tabs (Clopidogrel Bisulfate) .... Take 1 Tablet By Mouth Once A Day 2)  Potassium Chloride Cr 10 Meq  Cpcr (Potassium Chloride) .... 2 Once Daily 3)  Klonopin 0.5 Mg Tabs (Clonazepam) .... 2 Tablet By Mouth  At Bedtime 4)  Accu-Chek Compact  Strp (Glucose Blood) .... Once Daily-Bid 5)  Zovirax 5 % Crea (Acyclovir) .... Use Q 2 H X5 D For Cold Sores 6)  Dilantin 100 Mg Caps (Phenytoin Sodium Extended) .... 2 By Mouth Qhs 7)  Proair Hfa 108 (90  Base) Mcg/act Aers (Albuterol Sulfate) .... 2 Puffs Four Times A Day As Needed 8)  Lexapro 20 Mg Tabs (Escitalopram Oxalate) .... One Tablet By Mouth Once Daily At [facility] 9)  Glimepiride 1 Mg Tabs (Glimepiride) .Marland Kitchen.. 1 Tab By Mouth Bid 10)  Vitamin D 1000 Unit Tabs (Cholecalciferol) .Marland Kitchen.. 1 By Mouth Qd 11)  Ibuprofen 600 Mg Tabs (Ibuprofen) .Marland Kitchen.. 1 By Mouth Bid  Pc X 1 Wk Then As Needed For  Pain 12)  Doxycycline Hyclate 100 Mg Caps (Doxycycline Hyclate) .... Take 1 Two Times A Day For 7 Days  Allergies (verified): 1)  ! Penicillin 2)  Mevacor 3)  Amaryl (Glimepiride) 4)  Metformin Hcl  Past History:  Past Medical History: Last updated: 03/20/2009 IRRITABLE BOWEL SYNDROME (ICD-564.1) Dr [name] DIABETES MELLITUS, TYPE II (ICD-250.00) TRANSIENT ISCHEMIC ATTACK, HX OF (ICD-V12.50) Dr Pearlean Brownie BRONCHITIS (ICD-490) TOBACCO ABUSE (ICD-305.1) ATHEROSCLEROSIS (ICD-440.9) INSOMNIA, CHRONIC (ICD-307.42  ADENOCARCINOMA, COLON (ICD-153.9) 2008S/P RIGHT HEMICOLECTOMY PERIPHERAL VASCULAR DISEASE (ICD-443.9) DEPRESSION (ICD-311) ANXIETY (ICD-300.00)    Dr Donell Beers RENAL CYST, LEFT (ICD-593.2) SLEEP RELATED MOVEMENT DISORDER UNSPECIFIED (ICD-780.58)   Seizure disorder 2009 Dr Pearlean Brownie GYN Dr Tenny Craw  Review of Systems  The patient denies fever, syncope, muscle weakness, difficulty walking, and depression.    Physical Exam  General:  Well developed, well nourished, no acute distress. Lungs:  Clear throughout to auscultation. Heart:  Regular rate and rhythm; no murmurs, rubs,  or bruits. Msk:  left ankle  - FROM, nontender - WB w/o pain - gait normal Skin:  left medial ankle region with mild soft tissue swelling but much improved erythema (cellulitis) within demarcated purple line drawn in ED last night - not warm to tough - no open ulcerations or fluculence   Impression & Recommendations:  Problem # 1:  CELLULITIS, ANKLE, LEFT (ICD-682.6)  improving - afeb, nontender - rec cont oral abx as rx'd by  ED last night - elevate as much as possible - ice as needed  if fever, inc swelling, inc redness or pain, should seek re-evaluation Her updated medication list for this problem includes:    Doxycycline Hyclate 100 Mg Caps (Doxycycline hyclate) .Marland Kitchen... Take 1 two times a day for 7 days  Elevate affected area. Warm moist compresses for 20 minutes every 2 hours while awake. Take antibiotics as directed and take acetaminophen as needed. To be seen in 48-72 hours if no improvement, sooner if worse.  Problem # 2:  DIABETES MELLITUS, TYPE II (ICD-250.00) instructed glc may be exac by ongoing infx - to cont meds and plans for nutritin education as prev ordered  Time spent with patient 25 minutes, more than 50% of this time was spent counseling patient on causes of cellulitis and its treatment; also on DM review  Her updated medication list for this problem includes:    Glimepiride 1 Mg Tabs (Glimepiride) .Marland Kitchen... 1 tab by mouth bid  Labs Reviewed: Creat: .9 (10/23/2009)    Reviewed HgBA1c results: 6.0 (11/08/2008)  6.8 (03/26/2008)  Complete Medication List: 1)  Plavix 75 Mg Tabs (Clopidogrel bisulfate) .... Take 1 tablet by mouth once a day 2)  Potassium Chloride Cr 10 Meq Cpcr (Potassium chloride) .... 2 once daily 3)  Klonopin 0.5 Mg Tabs (Clonazepam) .... 2 tablet by mouth  at bedtime 4)  Accu-chek Compact Strp (Glucose blood) .... Once daily-bid 5)  Zovirax 5 % Crea (Acyclovir) .... Use q 2 h x5 d for cold sores 6)  Dilantin 100 Mg Caps (Phenytoin sodium extended) .... 2 by mouth qhs 7)  Proair Hfa 108 (90 Base) Mcg/act Aers (Albuterol sulfate) .... 2 puffs four times a day as needed 8)  Lexapro 20 Mg Tabs (Escitalopram oxalate) .... One tablet by mouth once daily at hs 9)  Glimepiride 1 Mg Tabs (Glimepiride) .Marland Kitchen.. 1 tab by mouth bid 10)  Vitamin D 1000 Unit Tabs (Cholecalciferol) .Marland Kitchen.. 1 by mouth qd 11)  Ibuprofen 600 Mg Tabs (Ibuprofen) .Marland Kitchen.. 1 by mouth bid  pc x 1 wk then as needed for   pain 12)  Doxycycline Hyclate 100 Mg Caps (Doxycycline hyclate) .... Take 1 two times a day for 7 days  Patient Instructions: 1)  it was good to see you today.  2)  ankle is looking better - take the antibiotics as prescribed and keep foot elevated as instructed fornext 7 days (or until all better) 3)  if fever, increase redness, increased swelling or pain, seek  further evaluation - at ER or call here    X-ray  Procedure date:  10/23/2009  Findings:      Exam Type: (L) ankle  Impression: No acute bont findings

## 2010-11-04 NOTE — Letter (Signed)
[facility]   Imported By: Sherian Rein 01/04/2010 11:34:49  _____________________________________________________________________  External Attachment:    Type:   Image     Comment:   External Document

## 2010-11-04 NOTE — Assessment & Plan Note (Signed)
Summary: f/u appt/#cd   Vital Signs:  Patient profile:   77 year old female Height:      65 inches Weight:      138 pounds BMI:     23.05 O2 Sat:      90 % on Room air Temp:     98.2 degrees F oral Pulse rate:   85 / minute Pulse rhythm:   regular Resp:     16 per minute BP sitting:   138 / 60  (left arm) Cuff size:   regular  Vitals Entered By: Lanier Prude, CMA(AAMA) (May 28, 2010 3:35 PM)  O2 Flow:  Room air CC: f/u Is Patient Diabetic? Yes   Primary Care Nghia Mcent[place]:  Sula Soda, MD   CC:  f/u.  History of Present Illness: The patient presents for a follow up of hypertension, diabetes, hyperlipidemia, depression C/o UTI - she took 2 antibiotic; c/o vaginitis    Current Medications (verified): 1)  Plavix 75 Mg  Tabs (Clopidogrel Bisulfate) .... Take 1 Tablet By Mouth Once A Day 2)  Potassium Chloride Cr 10 Meq  Cpcr (Potassium Chloride) .... 2 Once Daily 3)  Klonopin 0.5 Mg Tabs (Clonazepam) .... 2 Tablet By Mouth  At Bedtime 4)  Accu-Chek Compact  Strp (Glucose Blood) .... Once Daily-Bid 5)  Dilantin 100 Mg Caps (Phenytoin Sodium Extended) .... 2 By Mouth Qhs 6)  Lexapro 20 Mg Tabs (Escitalopram Oxalate) .... One Tablet By Mouth Once Daily At [facility] 7)  Glimepiride 1 Mg Tabs (Glimepiride) .Marland Kitchen.. 1 Tab By Mouth Bid 8)  Vitamin D 1000 Unit Tabs (Cholecalciferol) .Marland Kitchen.. 1 By Mouth Qd 9)  Ibuprofen 600 Mg Tabs (Ibuprofen) .Marland Kitchen.. 1 By Mouth Bid  Pc X 1 Wk Then As Needed For  Pain 10)  Infed 50 Mg/ml Soln (Iron Dextran) .... [facility] 11)  Ferrous Sulfate 325 (65 Fe) Mg  Tabs (Ferrous Sulfate) .... One By Mouth Two Times A Day Take Indefinitely 12)  Proair Hfa 108 (90 Base) Mcg/act Aers (Albuterol Sulfate) .... 2 Puffs Four Times A Day As Needed Rescue Inhaler 13)  Nitrofurantoin Macrocrystal 100 Mg Caps (Nitrofurantoin Macrocrystal) .... Take 1 Tablet By Mouth Two Times A Day 14)  Neurontin 300 Mg Caps (Gabapentin) .... Take 1 Capsule By Mouth Two Times  A Day  Allergies (verified): 1)  ! Penicillin 2)  Mevacor 3)  Metformin Hcl  Past History:  Past Medical History: Last updated: 04/22/2010 AS: moderate by echo 6/11 gradient 20/36 mmHg for mean and peak TRANSIENT ISCHEMIC ATTACK Dr Pearlean Brownie PERIPHERAL VASCULAR DISEASE  SYSTOLIC MURMUR  ADENOCARCINOMA, COLON 2008S/P RIGHT HEMICOLECTOMY IRRITABLE BOWEL SYNDROME  Dr [name] DIABETES MELLITUS, TYPE II  BRONCHITIS  ATHEROSCLEROSIS DEPRESSION  ANXIETY    Dr Donell Beers RENAL CYST, LEFT  SLEEP RELATED MOVEMENT DISORDER UNSPECIFIED  CIRRHOSIS AORTIC Stenosis- moderate, ECHO 2011  Seizure disorder 2009 Dr Pearlean Brownie GYN Dr Tenny Craw ANEMIA, IRON DEFICIENCY  CELLULITIS, ANKLE, LEFT  BREAST PAIN  ABNORMAL FINDINGS GI TRACT PERSONAL HX COLONIC POLYPS  DIVERTICULOSIS-COLON  PERSONAL HX COLON CANCER BELCHING  INFECTIOUS DIARRHEA SINUSITIS, ACUTE COPD  ABNORMAL LIVER FUNCTION TESTS  THRUSH FATIGUE  GERD BRONCHITIS ATHEROSCLEROSIS  INSOMNIA, CHRONIC RENAL CYST, LEFT   Past Surgical History: Last updated: 03/08/2009 Carotid endarterectomy EUS (01/07/2006 , and 04/22/2006) EGD (11/19/05) Right hemi-Colectomy   2008 Breast-Lumpectomy 11/09  Social History: Last updated: 02/25/2010 2-3 cigs/ day- rare, occasional- encouraged to stop Divorced with children Occupation: Retired Alcohol use-never Drug use-never Regular exercise-yes chair  yoga 2010 Daily Caffeine Use  Review of Systems  The patient denies fever, chest pain, syncope, and abdominal pain.    Physical Exam  General:  Well developed, well nourished, no acute distress. Ears:  External ear exam shows no significant lesions or deformities.  Otoscopic examination reveals clear canals, tympanic membranes are intact bilaterally without bulging, retraction, inflammation or discharge. Hearing is grossly normal bilaterally. Nose:  No deformity, discharge,  or lesions. Mouth:  Oral mucosa and oropharynx without lesions or exudates.   Teeth in good repair. Neck:  No deformities, masses, or tenderness noted. Lungs:  Clear throughout to auscultation. Heart:  Regular rate and rhythm; no murmurs, rubs,  or bruits. Abdomen:  Soft, nontender and nondistended. No masses, hepatosplenomegaly or hernias noted. Normal bowel sounds. Msk:  Some stiffness in LS spine is present Extremities:  No clubbing, cyanosis, edema, or deformity noted with normal full range of motion of all joints.  No edema Neurologic:  Alert and  oriented x4;   Skin:  AKs L ear brown growth - large Cervical Nodes:  No lymphadenopathy noted Psych:  Alert and cooperative. not anxious appearing, not suicidal, some depressed affect.     Impression & Recommendations:  Problem # 1:  ANEMIA-B12 DEFICIENCY (ICD-281.1) Assessment Unchanged  Her updated medication list for this problem includes:    Infed 50 Mg/ml Soln (Iron dextran) .Marland Kitchen... As needed at the cancer center    Ferrous Sulfate 325 (65 Fe) Mg Tabs (Ferrous sulfate) ..... One by mouth two times a day take indefinitely  Problem # 2:  TRANSIENT ISCHEMIC ATTACK, HX OF (ICD-V12.50) Assessment: Unchanged On the regimen of medicine(s) reflected in the chart    Problem # 3:  PERIPHERAL VASCULAR DISEASE (ICD-443.9) Assessment: Unchanged On the regimen of medicine(s) reflected in the chart    Problem # 4:  DEPRESSION (ICD-311) Assessment: Improved  Her updated medication list for this problem includes:    Klonopin 0.5 Mg Tabs (Clonazepam) .Marland Kitchen... 2 tablet by mouth  at bedtime    Lexapro 20 Mg Tabs (Escitalopram oxalate) ..... One tablet by mouth once daily at hs  Problem # 5:  VAGINITIS (ICD-616.10) Assessment: New  Her updated medication list for this problem includes:    Nitrofurantoin Macrocrystal 100 Mg Caps (Nitrofurantoin macrocrystal) .Marland Kitchen... Take 1 tablet by mouth two times a day  Problem # 6:  UTI (ICD-599.0) repeat Assessment: Comment Only  Her updated medication list for this problem  includes:    Nitrofurantoin Macrocrystal 100 Mg Caps (Nitrofurantoin macrocrystal) .Marland Kitchen... Take 1 tablet by mouth two times a day  Orders: TLB-Udip ONLY (81003-UDIP) T-Culture, Urine ([id number])  Problem # 7:  NEOPLASM OF UNCERTAIN BEHAVIOR OF SKIN (ICD-238.2) L ear Assessment: Deteriorated ENT cons offered to biopsy to r/o ca - she declined  Complete Medication List: 1)  Plavix 75 Mg Tabs (Clopidogrel bisulfate) .... Take 1 tablet by mouth once a day 2)  Potassium Chloride Cr 10 Meq Cpcr (Potassium chloride) .... 2 once daily 3)  Klonopin 0.5 Mg Tabs (Clonazepam) .... 2 tablet by mouth  at bedtime 4)  Accu-chek Compact Strp (Glucose blood) .... Once daily-bid 5)  Dilantin 100 Mg Caps (Phenytoin sodium extended) .... 2 by mouth qhs 6)  Lexapro 20 Mg Tabs (Escitalopram oxalate) .... One tablet by mouth once daily at hs 7)  Glimepiride 1 Mg Tabs (Glimepiride) .Marland Kitchen.. 1 tab by mouth bid 8)  Vitamin D 1000 Unit Tabs (Cholecalciferol) .Marland Kitchen.. 1 by mouth qd 9)  Ibuprofen 600 Mg Tabs (Ibuprofen) .Marland KitchenMarland KitchenMarland Kitchen  1 by mouth bid  pc x 1 wk then as needed for  pain 10)  Infed 50 Mg/ml Soln (Iron dextran) .... As needed at the cancer center 11)  Ferrous Sulfate 325 (65 Fe) Mg Tabs (Ferrous sulfate) .... One by mouth two times a day take indefinitely 12)  Proair Hfa 108 (90 Base) Mcg/act Aers (Albuterol sulfate) .... 2 puffs four times a day as needed rescue inhaler 13)  Nitrofurantoin Macrocrystal 100 Mg Caps (Nitrofurantoin macrocrystal) .... Take 1 tablet by mouth two times a day 14)  Neurontin 300 Mg Caps (Gabapentin) .... Take 1 capsule by mouth two times a day  Other Orders: Flu Vaccine 5yrs + ([address]) Administration Flu vaccine - MCR (G0008) TLB-BMP (Basic Metabolic Panel-BMET) (80048-METABOL) TLB-CBC Platelet - w/Differential (85025-CBCD)  Patient Instructions: 1)  Please schedule a follow-up appointment in 3 months. 2)  BMP prior to visit, ICD-9: 3)  Hepatic Panel prior to visit, ICD-9: 4)  TSH prior  to visit, ICD-9: 5)  HbgA1C prior to visit, ICD-9: 250.00  995.20   .lbmedflu   Flu Vaccine Consent Questions     Do you have a history of severe allergic reactions to this vaccine? no    Any prior history of allergic reactions to egg and/or gelatin? no    Do you have a sensitivity to the preservative Thimersol? no    Do you have a past history of Guillan-Barre Syndrome? no    Do you currently have an acute febrile illness? no    Have you ever had a severe reaction to latex? no    Vaccine information given and explained to patient? yes    Are you currently pregnant? no    Lot Number:[id number]   Exp Date:04/04/2011   Site Given  Left Deltoid IM  Lanier Prude, [facility])  May 28, 2010 4:26 PM

## 2010-11-04 NOTE — Miscellaneous (Signed)
Summary: Orders Update pft charges  Clinical Lists Changes  Orders: Added new Service order of Lung Volumes (94240) - Signed Added new Service order of Carbon Monoxide diffusing w/capacity (94720) - Signed Added new Service order of Spirometry (Pre & Post) (94060) - Signed 

## 2010-11-04 NOTE — Letter (Signed)
Summary: Office Visit Letter  [facility]  [address] [place], [place] [address]   Phone: [phone]  Fax: [phone]      July 25, 2010 MRN: [id number]   [facility] [address] [place], [place]  [address]   Dear Ms. Lorincz,   According to our records, it is time for you to schedule a follow-up office visit with [place] in the month of Dec 2011.   At your convenience, please call [phone] (option #2)to schedule an office visit. If you have any questions, concerns, or feel that this letter is in error, we would appreciate your call.   Sincerely,  Wilhemina Bonito. [name], M.D.  [facility] [phone]

## 2010-11-04 NOTE — Assessment & Plan Note (Signed)
Summary: 2 MO ROV /NWS   Vital Signs:  Patient profile:   77 year old female Weight:      135 pounds Temp:     98.4 degrees F oral Pulse rate:   93 / minute BP sitting:   150 / 70  (left arm)  Vitals Entered By: Tora Perches (December 23, 2009 1:49 PM) CC: f/u Is Patient Diabetic? Yes   Primary Care Provider:  Sula Soda, MD   CC:  f/u.  History of Present Illness: The patient presents for a follow up of hypertension, diabetes, hyperlipidemia, COPD She tells me that her CEA was up, CBC was low recentely. Colon is pending    Preventive Screening-Counseling & Management  Alcohol-Tobacco     Smoking Status: current  Current Medications (verified): 1)  Plavix 75 Mg  Tabs (Clopidogrel Bisulfate) .... Take 1 Tablet By Mouth Once A Day 2)  Potassium Chloride Cr 10 Meq  Cpcr (Potassium Chloride) .... 2 Once Daily 3)  Klonopin 0.5 Mg Tabs (Clonazepam) .... 2 Tablet By Mouth  At Bedtime 4)  Accu-Chek Compact  Strp (Glucose Blood) .... Once Daily-Bid 5)  Zovirax 5 % Crea (Acyclovir) .... Use Q 2 H X5 D For Cold Sores 6)  Dilantin 100 Mg Caps (Phenytoin Sodium Extended) .... 2 By Mouth Qhs 7)  Proair Hfa 108 (90 Base) Mcg/act Aers (Albuterol Sulfate) .... 2 Puffs Four Times A Day As Needed 8)  Lexapro 20 Mg Tabs (Escitalopram Oxalate) .... One Tablet By Mouth Once Daily At [facility] 9)  Glimepiride 1 Mg Tabs (Glimepiride) .Marland Kitchen.. 1 Tab By Mouth Bid 10)  Vitamin D 1000 Unit Tabs (Cholecalciferol) .Marland Kitchen.. 1 By Mouth Qd 11)  Ibuprofen 600 Mg Tabs (Ibuprofen) .Marland Kitchen.. 1 By Mouth Bid  Pc X 1 Wk Then As Needed For  Pain 12)  Doxycycline Hyclate 100 Mg Caps (Doxycycline Hyclate) .... Take 1 Two Times A Day For 7 Days 13)  Nebulizer Compressor  Misc (Nebulizers) 14)  Questran 4 Gm Pack (Cholestyramine) .... Use One Packet Two Times A Day  Allergies: 1)  ! Penicillin 2)  Mevacor 3)  Amaryl (Glimepiride) 4)  Metformin Hcl  Past History:  Past Medical History: Last updated: 12/03/2009 IRRITABLE  BOWEL SYNDROME (ICD-564.1) Dr [name] DIABETES MELLITUS, TYPE II (ICD-250.00) TRANSIENT ISCHEMIC ATTACK, HX OF (ICD-V12.50) Dr Pearlean Brownie BRONCHITIS (ICD-490) TOBACCO ABUSE (ICD-305.1) ATHEROSCLEROSIS (ICD-440.9) INSOMNIA, CHRONIC (ICD-307.42 ADENOCARCINOMA, COLON (ICD-153.9) 2008S/P RIGHT HEMICOLECTOMY PERIPHERAL VASCULAR DISEASE (ICD-443.9) DEPRESSION (ICD-311) ANXIETY (ICD-300.00)    Dr Donell Beers RENAL CYST, LEFT (ICD-593.2) SLEEP RELATED MOVEMENT DISORDER UNSPECIFIED (ICD-780.58) hepatic cirrhosis- non alcoholic   Seizure disorder 2009 Dr Pearlean Brownie GYN Dr Tenny Craw  Past Surgical History: Last updated: 03/08/2009 Carotid endarterectomy EUS (01/07/2006 , and 04/22/2006) EGD (11/19/05) Right hemi-Colectomy   2008 Breast-Lumpectomy 11/09  Social History: Last updated: 09/11/2009 2-3 cigs/ day- rare, occasional- encouraged to stop Retired Divorced with children Current Smoker Occupation: Retired Alcohol use-never Drug use-never Regular exercise-yes chair yoga 2010  Review of Systems  The patient denies weight loss, chest pain, prolonged cough, and abdominal pain.    Physical Exam  General:  Well developed, well nourished, no acute distress. Nose:  No deformity, discharge,  or lesions. Mouth:  Oral mucosa and oropharynx without lesions or exudates.  Teeth in good repair. Lungs:  Clear throughout to auscultation. Heart:  Regular rate and rhythm; no murmurs, rubs,  or bruits. Abdomen:  Soft, nontender and nondistended. No masses, hepatosplenomegaly or hernias noted. Normal bowel sounds. Msk:  Some stiffness in LS  spine is present Neurologic:  Alert and  oriented x4;   Skin:  left medial ankle region with mild soft tissue swelling but much improved erythema (cellulitis) within demarcated purple line drawn in ED last night - not warm to tough - no open ulcerations or fluculence Psych:  Alert and cooperative. not anxious appearing, not suicidal, some depressed affect.     Impression &  Recommendations:  Problem # 1:  ADENOCARCINOMA, COLON (ICD-153.9) Assessment Comment Only CEA was up Colon is pending   Problem # 2:  DIABETES MELLITUS, TYPE II (ICD-250.00) Assessment: Unchanged  Her updated medication list for this problem includes:    Glimepiride 1 Mg Tabs (Glimepiride) .Marland Kitchen... 1 tab by mouth bid  Problem # 3:  ANEMIA, IRON DEFICIENCY (ICD-280.9) IV iron is pending   Problem # 4:  FATIGUE (ICD-780.79) Assessment: Unchanged  Problem # 5:  CIRRHOSIS (ICD-571.5) Assessment: Unchanged  Problem # 6:  DEPRESSION (ICD-311) Assessment: Unchanged  Her updated medication list for this problem includes:    Klonopin 0.5 Mg Tabs (Clonazepam) .Marland Kitchen... 2 tablet by mouth  at bedtime    Lexapro 20 Mg Tabs (Escitalopram oxalate) ..... One tablet by mouth once daily at hs  Complete Medication List: 1)  Plavix 75 Mg Tabs (Clopidogrel bisulfate) .... Take 1 tablet by mouth once a day 2)  Potassium Chloride Cr 10 Meq Cpcr (Potassium chloride) .... 2 once daily 3)  Klonopin 0.5 Mg Tabs (Clonazepam) .... 2 tablet by mouth  at bedtime 4)  Accu-chek Compact Strp (Glucose blood) .... Once daily-bid 5)  Zovirax 5 % Crea (Acyclovir) .... Use q 2 h x5 d for cold sores 6)  Dilantin 100 Mg Caps (Phenytoin sodium extended) .... 2 by mouth qhs 7)  Proair Hfa 108 (90 Base) Mcg/act Aers (Albuterol sulfate) .... 2 puffs four times a day as needed 8)  Lexapro 20 Mg Tabs (Escitalopram oxalate) .... One tablet by mouth once daily at hs 9)  Glimepiride 1 Mg Tabs (Glimepiride) .Marland Kitchen.. 1 tab by mouth bid 10)  Vitamin D 1000 Unit Tabs (Cholecalciferol) .Marland Kitchen.. 1 by mouth qd 11)  Ibuprofen 600 Mg Tabs (Ibuprofen) .Marland Kitchen.. 1 by mouth bid  pc x 1 wk then as needed for  pain 12)  Doxycycline Hyclate 100 Mg Caps (Doxycycline hyclate) .... Take 1 two times a day for 7 days 13)  Nebulizer Compressor Misc (Nebulizers) 14)  Questran 4 Gm Pack (Cholestyramine) .... Use one packet two times a day  Patient  Instructions: 1)  Please schedule a follow-up appointment in 3 months. 2)  BMP prior to visit, ICD-9: 3)  Hepatic Panel prior to visit, ICD-9: 4)  TSH prior to visit, ICD-9: 5)  CBC w/ Diff prior to visit, ICD-9: 6)  HbgA1C prior to visit, ICD-9: 250.00  995.20

## 2010-11-04 NOTE — Assessment & Plan Note (Signed)
Summary: rov 6 months///kp   Copy to:  n/a Primary Provider/Referring Provider:  Sula Soda, MD   CC:  6 month follow up visit-COPD-pt has quit smoking.Marland Kitchen  History of Present Illness:  March 18, 2010- COPD, bronchitis, tobacco She admits smoking most of her life and now averages 1/2 PPD. She wants to quit and to discuss methods. Continues iron for hx anemia.  Daily cough, often productive clear mucus, some wheeze. DOE with sustained walking, housekeeping. Uses Proair 4x/day. Has neb but it makes her too jittery. CXR- April- Hypervent but NAD PFT 2009- Moderate obstructive disease.  April 18, 2010- COPD, bronchitis, tobacco Trying to stop smoking. comes to review PFT and also the echocardiogram done with known murmur, with question of pulmonary vs cardiac exercise limitation. She keeps some cough with white phlegm but denies chest pain, palpitation or syncope. Feet swell little if at all. PFT- moderate COPD with emphysema, little dilator response. FEV1/FVC 0.50, FEV1 1.32/ 69%. ECHO- EF 60%, Mod AS, mild LVH and mild Diastolic dysfunction. She wants to see a specific [facility] cardiologist and will call us back with the name. She had fired her previous cardiologist.  July 14, 2010- COPD,  bronchits, tobacco/ quit cc:6 month follow up visit-COPD-pt has quit smoking!  She is noticing an improvement in her cough. Discussed visit to Neurology- suspected vertebrobasilar ischemia. She admits wheeze although she doesn't hear well. She doesn't seem to notice much dyspnea with ADLs and deneies discolored sputum, chest pain, palpitation. She is rarely using her rescue inhaler now.   Preventive Screening-Counseling & Management  Alcohol-Tobacco     Smoking Status: quit < 6 months     Smoking Cessation Counseling: yes     Year Quit: 05-2010     Tobacco Counseling: to remain off tobacco products  Current Medications (verified): 1)  Plavix 75 Mg  Tabs (Clopidogrel Bisulfate) .... Take 1 Tablet By  Mouth Once A Day 2)  Potassium Chloride Cr 10 Meq  Cpcr (Potassium Chloride) .... 2 Once Daily 3)  Klonopin 0.5 Mg Tabs (Clonazepam) .... 2 Tablet By Mouth  At Bedtime 4)  Accu-Chek Compact  Strp (Glucose Blood) .... Once Daily-Bid 5)  Dilantin 100 Mg Caps (Phenytoin Sodium Extended) .... 2 By Mouth Qhs 6)  Lexapro 20 Mg Tabs (Escitalopram Oxalate) .... One Tablet By Mouth Once Daily At [facility] 7)  Glimepiride 1 Mg Tabs (Glimepiride) .Marland Kitchen.. 1 Tab By Mouth Bid 8)  Vitamin D 1000 Unit Tabs (Cholecalciferol) .Marland Kitchen.. 1 By Mouth Qd 9)  Ibuprofen 600 Mg Tabs (Ibuprofen) .Marland Kitchen.. 1 By Mouth Bid  Pc X 1 Wk Then As Needed For  Pain 10)  Infed 50 Mg/ml Soln (Iron Dextran) .... [facility] 11)  Ferrous Sulfate 325 (65 Fe) Mg  Tabs (Ferrous Sulfate) .... One By Mouth Two Times A Day Take Indefinitely 12)  Proair Hfa 108 (90 Base) Mcg/act Aers (Albuterol Sulfate) .... 2 Puffs Four Times A Day As Needed Rescue Inhaler 13)  Neurontin 300 Mg Caps (Gabapentin) .... Take 1 Capsule By Mouth Once Daily  Allergies (verified): 1)  ! Penicillin 2)  Mevacor 3)  Metformin Hcl  Past History:  Past Medical History: Last updated: 04/22/2010 AS: moderate by echo 6/11 gradient 20/36 mmHg for mean and peak TRANSIENT ISCHEMIC ATTACK Dr Pearlean Brownie PERIPHERAL VASCULAR DISEASE  SYSTOLIC MURMUR  ADENOCARCINOMA, COLON 2008S/P RIGHT HEMICOLECTOMY IRRITABLE BOWEL SYNDROME  Dr [name] DIABETES MELLITUS, TYPE II  BRONCHITIS  ATHEROSCLEROSIS DEPRESSION  ANXIETY    Dr Donell Beers  RENAL CYST, LEFT  SLEEP RELATED MOVEMENT DISORDER UNSPECIFIED  CIRRHOSIS AORTIC Stenosis- moderate, ECHO 2011  Seizure disorder 2009 Dr Pearlean Brownie GYN Dr Tenny Craw ANEMIA, IRON DEFICIENCY  CELLULITIS, ANKLE, LEFT  BREAST PAIN  ABNORMAL FINDINGS GI TRACT PERSONAL HX COLONIC POLYPS  DIVERTICULOSIS-COLON  PERSONAL HX COLON CANCER BELCHING  INFECTIOUS DIARRHEA SINUSITIS, ACUTE COPD  ABNORMAL LIVER FUNCTION TESTS  THRUSH FATIGUE   GERD BRONCHITIS ATHEROSCLEROSIS  INSOMNIA, CHRONIC RENAL CYST, LEFT   Past Surgical History: Last updated: 03/08/2009 Carotid endarterectomy EUS (01/07/2006 , and 04/22/2006) EGD (11/19/05) Right hemi-Colectomy   2008 Breast-Lumpectomy 11/09  Family History: Last updated: 12/30/2009 Family History Hypertension Family History of Colon Cancer: Paternal Grandmother Family History of Esophageal Cancer: brother  Social History: Last updated: 02/25/2010 2-3 cigs/ day- rare, occasional- encouraged to stop Divorced with children Occupation: Retired Alcohol use-never Drug use-never Regular exercise-yes chair yoga 2010 Daily Caffeine Use  Risk Factors: Exercise: yes (09/11/2009)  Risk Factors: Smoking Status: quit < 6 months (07/14/2010) Packs/Day: <0.25 (04/18/2010)  Social History: Smoking Status:  quit < 6 months  Review of Systems      See HPI       The patient complains of shortness of breath with activity and non-productive cough.  The patient denies shortness of breath at rest, productive cough, coughing up blood, chest pain, irregular heartbeats, acid heartburn, indigestion, loss of appetite, weight change, abdominal pain, difficulty swallowing, sore throat, tooth/dental problems, headaches, nasal congestion/difficulty breathing through nose, and sneezing.    Vital Signs:  Patient profile:   77 year old female Height:      65 inches Weight:      145.25 pounds BMI:     24.26 O2 Sat:      91 % on Room air Pulse rate:   103 / minute BP sitting:   176 / 72  (right arm) Cuff size:   regular  Vitals Entered By: Reynaldo Minium CMA (July 14, 2010 2:18 PM)  O2 Flow:  Room air CC: 6 month follow up visit-COPD-pt has quit smoking.   Physical Exam  Additional Exam:  General: A/Ox3; pleasant and cooperative, NAD, gentle little lady SKIN: no rash, lesions NODES: no lymphadenopathy HEENT: [place]/AT, EOM- WNL, Conjuctivae- clear, PERRLA, TM-WNL, Nose- clear, Throat- clear  and wnl, dentures, Mallampati  II NECK: Supple w/ fair ROM, JVD- none, normal carotid impulses w/o bruits Thyroid-  CHEST:clear- mild I&E wheeze, unlabored, no dullness. HEART: RRR- regular to my exam,1- 2/6 systolic murmur high at aortic space ABDOMEN: nontender EAV:WUJW, nl pulses, no edema  NEURO: Grossly intact to observation      Impression & Recommendations:  Problem # 1:  COPD (ICD-496) Less bronchitis now- hopefuly as she remains off cigarettes this will continue to improve. Had flu vax.   Problem # 2:  TOBACCO ABUSE (ICD-305.1) Encouraged to stay off cigarettes. She is still mobile enough to buy her own, and has lonely/ bored times at home where she may be tempted.  Orders: Est. Patient Level III (11914)  Medications Added to Medication List This Visit: 1)  Neurontin 300 Mg Caps (Gabapentin) .... Take 1 capsule by mouth once daily  Patient Instructions: 1)  Please schedule a follow-up appointment in 2 months. 2)  I am so proud of you for stopping smoking ! !

## 2010-11-04 NOTE — Letter (Signed)
Summary: Appointment Reminder  [facility]  [address] [place], [place] [address]   Phone: [phone]  Fax: [phone]        October 24, 2009 MRN: [id number]    [facility] [address] [place], [place]  [address]    Dear Ms. Shavers,    Please call our office to schedule a follow-up appointment as recommended by Dr.Niesha Bame at the time of your endoscopy on 09/03/2009.We hope that you allow [place] to participate in your health care needs. Please contact us at your earliest convenience [phone]  to schedule your appointment.     Sincerely,    Teryl Lucy RN

## 2010-11-04 NOTE — Letter (Signed)
[facility]   Imported By: Sherian Rein 04/03/2010 13:28:42  _____________________________________________________________________  External Attachment:    Type:   Image     Comment:   External Document

## 2010-11-04 NOTE — Letter (Signed)
Summary: [facility]  [facility]   Imported By: Lester Buffalo 01/01/2010 11:30:51  _____________________________________________________________________  External Attachment:    Type:   Image     Comment:   External Document

## 2010-11-04 NOTE — Progress Notes (Signed)
Summary: Ibuprofen  Phone Note Call from Patient Call back at Home Phone [phone] Call back at [facility] on home #   Caller: [name] - dtr  Summary of Call: Pt's daughter called. Pt would like rx for 800mg  motrin or something similar. Says Dr misunderstood yesterday and gave her fioricet. She wanted rx for ibuprofen if it was ok for her to take b/c it is cheaper that way.  Initial call taken by: Lamar Sprinkles, CMA,  October 11, 2009 2:45 PM  Follow-up for Phone Call        OK Follow-up by: Tresa Garter MD,  October 12, 2009 10:51 PM  Additional Follow-up for Phone Call Additional follow up Details #1::        Pt informed  Additional Follow-up by: Lamar Sprinkles, CMA,  October 14, 2009 9:38 AM    New/Updated Medications: IBUPROFEN 600 MG TABS (IBUPROFEN) 1 by mouth bid  pc x 1 wk then as needed for  pain Prescriptions: IBUPROFEN 600 MG TABS (IBUPROFEN) 1 by mouth bid  pc x 1 wk then as needed for  pain  #60 x 3   Entered and Authorized by:   Tresa Garter MD   Signed by:   Lamar Sprinkles, CMA on 10/14/2009   Method used:   Electronically to        [facility]* (retail)       [address]       [place], [place]  [address]       Ph: [phone]       Fax: [phone]   RxID:   [id number]

## 2010-11-04 NOTE — Assessment & Plan Note (Signed)
Summary: ROV AFTER PFT ///kp   Copy to:  n/a Primary Provider/Referring Provider:  Sula Soda, MD   CC:  FOLLOWUP ON PFTS.  History of Present Illness: January 14, 2010- COPD, bronchitis Breathing was ok during recent colonoscopy. Has been bleeding and elevated  CEA. With hx Right hemicolectomy for adenoca, she has had a lot of testing and been given intravenous iron. She hasn't needed her nebulizer, but needs refill with her rescue inhaler.She had been using Proair a couple of times every other day. Some cough- clear mucus. Some rapid heartbeat noted occasionally. Denies chest pain or fever. sometimes feet may swell a little.  March 18, 2010- COPD, bronchitis, tobacco She admits smoking most of her life and now averages 1/2 PPD. She wants to quit and to discuss methods. Continues iron for hx anemia.  Daily cough, often productive clear mucus, some wheeze. DOE with sustained walking, housekeeping. Uses Proair 4x/day. Has neb but it makes her too jittery. CXR- April- Hypervent but NAD PFT 2009- Moderate obstructive disease.  April 18, 2010- COPD, bronchitis, tobacco Trying to stop smoking. comes to review PFT and also the echocardiogram done with known murmur, with question of pulmonary vs cardiac exercise limitation. She keeps some cough with white phlegm but denies chest pain, palpitation or syncope. Feet swell little if at all. PFT- moderate COPD with emphysema, little dilator response. FEV1/FVC 0.50, FEV1 1.32/ 69%. ECHO- EF 60%, Mod AS, mild LVH and mild Diastolic dysfunction. She wants to see a specific [facility] cardiologist and will call us back with the name. She had fired her previous cardiologist.    Preventive Screening-Counseling & Management  Alcohol-Tobacco     Smoking Status: current 10 CIGS A DAY     Smoking Cessation Counseling: yes     Packs/Day: <0.25     Tobacco Counseling: to quit use of tobacco products  Current Medications (verified): 1)  Plavix 75 Mg  Tabs  (Clopidogrel Bisulfate) .... Take 1 Tablet By Mouth Once A Day 2)  Potassium Chloride Cr 10 Meq  Cpcr (Potassium Chloride) .... 2 Once Daily 3)  Klonopin 0.5 Mg Tabs (Clonazepam) .... 2 Tablet By Mouth  At Bedtime 4)  Accu-Chek Compact  Strp (Glucose Blood) .... Once Daily-Bid 5)  Dilantin 100 Mg Caps (Phenytoin Sodium Extended) .... 2 By Mouth Qhs 6)  Lexapro 20 Mg Tabs (Escitalopram Oxalate) .... One Tablet By Mouth Once Daily At [facility] 7)  Glimepiride 1 Mg Tabs (Glimepiride) .Marland Kitchen.. 1 Tab By Mouth Bid 8)  Vitamin D 1000 Unit Tabs (Cholecalciferol) .Marland Kitchen.. 1 By Mouth Qd 9)  Ibuprofen 600 Mg Tabs (Ibuprofen) .Marland Kitchen.. 1 By Mouth Bid  Pc X 1 Wk Then As Needed For  Pain 10)  Infed 50 Mg/ml Soln (Iron Dextran) .... [facility] 11)  Ferrous Sulfate 325 (65 Fe) Mg  Tabs (Ferrous Sulfate) .... One By Mouth Two Times A Day Take Indefinitely 12)  Proair Hfa 108 (90 Base) Mcg/act Aers (Albuterol Sulfate) .... 2 Puffs Four Times A Day As Needed Rescue Inhaler  Allergies: 1)  ! Penicillin 2)  Mevacor 3)  Metformin Hcl  Past History:  Past Surgical History: Last updated: 03/08/2009 Carotid endarterectomy EUS (01/07/2006 , and 04/22/2006) EGD (11/19/05) Right hemi-Colectomy   2008 Breast-Lumpectomy 11/09  Family History: Last updated: 12/30/2009 Family History Hypertension Family History of Colon Cancer: Paternal Grandmother Family History of Esophageal Cancer: brother  Social History: Last updated: 02/25/2010 2-3 cigs/ day- rare, occasional- encouraged to stop Divorced with children  Occupation: Retired Alcohol use-never Drug use-never Regular exercise-yes chair yoga 2010 Daily Caffeine Use  Risk Factors: Exercise: yes (09/11/2009)  Risk Factors: Smoking Status: current 10 CIGS A DAY (04/18/2010) Packs/Day: <0.25 (04/18/2010)  Past Medical History: IRRITABLE BOWEL SYNDROME (ICD-564.1) Dr [name] DIABETES MELLITUS, TYPE II (ICD-250.00) TRANSIENT ISCHEMIC ATTACK, HX OF  (ICD-V12.50) Dr Pearlean Brownie BRONCHITIS (ICD-490) TOBACCO ABUSE (ICD-305.1) ATHEROSCLEROSIS (ICD-440.9) INSOMNIA, CHRONIC (ICD-307.42 ADENOCARCINOMA, COLON (ICD-153.9) 2008S/P RIGHT HEMICOLECTOMY PERIPHERAL VASCULAR DISEASE (ICD-443.9) DEPRESSION (ICD-311) ANXIETY (ICD-300.00)    Dr Donell Beers RENAL CYST, LEFT (ICD-593.2) SLEEP RELATED MOVEMENT DISORDER UNSPECIFIED (ICD-780.58) CIRRHOSIS AORTIC Stenosis- moderate, ECHO 2011   Seizure disorder 2009 Dr Pearlean Brownie GYN Dr Tenny Craw  Social History: Packs/Day:  <0.25 Smoking Status:  current 10 CIGS A DAY  Review of Systems      See HPI       The patient complains of shortness of breath with activity and productive cough.  The patient denies shortness of breath at rest, non-productive cough, coughing up blood, chest pain, irregular heartbeats, acid heartburn, indigestion, loss of appetite, weight change, abdominal pain, difficulty swallowing, sore throat, tooth/dental problems, headaches, nasal congestion/difficulty breathing through nose, and sneezing.    Vital Signs:  Patient profile:   77 year old female Height:      65 inches Weight:      136 pounds BMI:     22.71 O2 Sat:      92 % on Room air Pulse rate:   79 / minute BP sitting:   140 / 60  (right arm) Cuff size:   regular  Vitals Entered By: Kandice Hams CMA (April 18, 2010 4:50 PM)  O2 Flow:  Room air CC: FOLLOWUP ON PFTS   Physical Exam  Additional Exam:  General: A/Ox3; pleasant and cooperative, NAD, gentle little lady SKIN: no rash, lesions NODES: no lymphadenopathy HEENT: [place]/AT, EOM- WNL, Conjuctivae- clear, PERRLA, TM-WNL, Nose- clear, Throat- clear and wnl, dentures, Mallampati  II NECK: Supple w/ fair ROM, JVD- none, normal carotid impulses w/o bruits Thyroid-  CHEST:clear- no wheeze, unlabored, no dullness. HEART: RRR- regular to my exam, 2/6 systolic murmur high at aortic space ABDOMEN: nontender VZD:GLOV, nl pulses, no edema  NEURO: Grossly intact to  observation      Impression & Recommendations:  Problem # 1:  SYSTOLIC MURMUR (FIE-332.2)  Aortic stenosis. She wants to establish wih a cardiologist here that her daughter sees, and she will call us with that name..  Problem # 2:  COPD (ICD-496) Moderate COPD. We used this as a basis for emphasizing smoking cessation. She is using a rescue inhaler only occasionally and I'm not sure that she woud benefit to justify the cost of additional meds. We discussed that today and will watch how she does through the summer. Pulmonary rehab is a possibility after she has her cardiac evaluation.  Problem # 3:  TOBACCO ABUSE (ICD-305.1) We have discussed smoking habit vs addiction, and available support. She may decide to try the smoking cessation program at [facility].  Other Orders: Est. Patient Level III (95188)  Patient Instructions: 1)  Please schedule a follow-up appointment in 3 months. 2)  Please keep trying to stop smoking. 3)  Please call us with the name of the cardiologist you want to see and we will try to get you a referral.

## 2010-11-04 NOTE — Assessment & Plan Note (Signed)
Summary: Anemia / Elevated CEA    History of Present Illness Visit Type: Follow-up Visit Primary GI MD: Yancey Flemings MD Primary Provider: Sula Soda, MD  Requesting Provider: n/a Chief Complaint: Patient has recently had an elevation in her CEA level and an decrease in her in HGB. She states that the last time her labs changed like this she was dxed with colon cancer.  Dr. Myna Hidalgo advised her to come see Dr. [name] for a repeat colonoscopy ASAP.  History of Present Illness:   Patient followed by Dr. [name] for cirrhosis (diagnosed within last few months) and diarrhea predominant IBS.  She has multiple medical problems including diabetes mellitus, cerebrovascular disease with prior TIA on chronic Plavix therapy, colon cancer status post right hemicolectomy, chronic anxiety/depression, osteoarthritis,  tobacco abuse with intrinsic lung disease.   Saw Dr. Myna Hidalgo recently for routine follow-up for history of colon cancer. Her hgb was noted to have fallen, she had a ferritin of 12 and CEA level had risen slightly from 4.9 to 5.3. Dr. Myna Hidalgo sent her over for evaluation.    GI Review of Systems      Denies abdominal pain, acid reflux, belching, bloating, chest pain, dysphagia with liquids, dysphagia with solids, heartburn, loss of appetite, nausea, vomiting, vomiting blood, weight loss, and  weight gain.        Denies anal fissure, black tarry stools, change in bowel habit, constipation, diarrhea, diverticulosis, fecal incontinence, heme positive stool, hemorrhoids, irritable bowel syndrome, jaundice, light color stool, liver problems, rectal bleeding, and  rectal pain.    Current Medications (verified): 1)  Plavix 75 Mg  Tabs (Clopidogrel Bisulfate) .... Take 1 Tablet By Mouth Once A Day 2)  Potassium Chloride Cr 10 Meq  Cpcr (Potassium Chloride) .... 2 Once Daily 3)  Klonopin 0.5 Mg Tabs (Clonazepam) .... 2 Tablet By Mouth  At Bedtime 4)  Accu-Chek Compact  Strp (Glucose Blood) .... Once  Daily-Bid 5)  Zovirax 5 % Crea (Acyclovir) .... Use Q 2 H X5 D For Cold Sores 6)  Dilantin 100 Mg Caps (Phenytoin Sodium Extended) .... 2 By Mouth Qhs 7)  Proair Hfa 108 (90 Base) Mcg/act Aers (Albuterol Sulfate) .... 2 Puffs Four Times A Day As Needed 8)  Lexapro 20 Mg Tabs (Escitalopram Oxalate) .... One Tablet By Mouth Once Daily At [facility] 9)  Glimepiride 1 Mg Tabs (Glimepiride) .Marland Kitchen.. 1 Tab By Mouth Bid 10)  Vitamin D 1000 Unit Tabs (Cholecalciferol) .Marland Kitchen.. 1 By Mouth Qd 11)  Ibuprofen 600 Mg Tabs (Ibuprofen) .Marland Kitchen.. 1 By Mouth Bid  Pc X 1 Wk Then As Needed For  Pain 12)  Doxycycline Hyclate 100 Mg Caps (Doxycycline Hyclate) .... Take 1 Two Times A Day For 7 Days 13)  Nebulizer Compressor  Misc (Nebulizers) 14)  Questran 4 Gm Pack (Cholestyramine) .... Use One Packet Two Times A Day 15)  Infed 50 Mg/ml Soln (Iron Dextran) .... As Needed At [facility]  Allergies (verified): 1)  ! Penicillin 2)  Mevacor 3)  Amaryl (Glimepiride) 4)  Metformin Hcl  Past History:  Past Medical History: IRRITABLE BOWEL SYNDROME (ICD-564.1) Dr [name] DIABETES MELLITUS, TYPE II (ICD-250.00) TRANSIENT ISCHEMIC ATTACK, HX OF (ICD-V12.50) Dr Pearlean Brownie BRONCHITIS (ICD-490) TOBACCO ABUSE (ICD-305.1) ATHEROSCLEROSIS (ICD-440.9) INSOMNIA, CHRONIC (ICD-307.42 ADENOCARCINOMA, COLON (ICD-153.9) 2008S/P RIGHT HEMICOLECTOMY PERIPHERAL VASCULAR DISEASE (ICD-443.9) DEPRESSION (ICD-311) ANXIETY (ICD-300.00)    Dr Donell Beers RENAL CYST, LEFT (ICD-593.2) SLEEP RELATED MOVEMENT DISORDER UNSPECIFIED (ICD-780.58) CIRRHOSIS   Seizure disorder 2009 Dr Pearlean Brownie GYN Dr Tenny Craw  Past  Surgical History: Reviewed history from 03/08/2009 and no changes required. Carotid endarterectomy EUS (01/07/2006 , and 04/22/2006) EGD (11/19/05) Right hemi-Colectomy   2008 Breast-Lumpectomy 11/09  Family History: Family History Hypertension Family History of Colon Cancer: Paternal Grandmother Family History of Esophageal Cancer: brother  Social  History: 2-3 cigs/ day- rare, occasional- encouraged to stop Retired Divorced with children Current Smoker Occupation: Retired Alcohol use-never Drug use-never Regular exercise-yes chair yoga 2010 Daily Caffeine Use  Review of Systems       The patient complains of back pain, fatigue, headaches-new, shortness of breath, sleeping problems, swelling of feet/legs, and thirst - excessive.  The patient denies allergy/sinus, anemia, anxiety-new, arthritis/joint pain, blood in urine, breast changes/lumps, change in vision, confusion, cough, coughing up blood, depression-new, fainting, fever, hearing problems, heart murmur, heart rhythm changes, itching, menstrual pain, muscle pains/cramps, night sweats, nosebleeds, pregnancy symptoms, skin rash, sore throat, swollen lymph glands, thirst - excessive , urination - excessive , urination changes/pain, urine leakage, vision changes, and voice change.    Vital Signs:  Patient profile:   77 year old female Height:      65 inches Weight:      134.0 pounds BMI:     22.38 Pulse rate:   100 / minute Pulse rhythm:   regular BP sitting:   124 / 62  (left arm) Cuff size:   regular  Vitals Entered By: Harlow Mares CMA Duncan Dull) (December 30, 2009 3:31 PM)  Physical Exam  General:  Well developed, well nourished, no acute distress. Neck:  no obvious masses  Lungs:  Clear throughout to auscultation. Heart:  RRR Abdomen:  Abdomen soft, nontender, nondistended. No obvious masses or hepatomegaly.Normal bowel sounds.  Extremities:  No palmar erythema, no edema.  Neurologic:  Alert and  oriented x4;  grossly normal neurologically. Skin:  Intact without significant lesions or rashes. Cervical Nodes:  No significant cervical adenopathy. Psych:  Alert and cooperative. Normal mood and affect.   Impression & Recommendations:  Problem # 1:  ANEMIA, IRON DEFICIENCY (ICD-280.9) Assessment Deteriorated Referred by Dr. Myna Hidalgo for iron deficiency anemia with  recent fall in hgb. from 12.7 to 11.1.  Ferritin 12, MCV 78. She has a history of stage I right sided colon cancer May 2008. Her CEA up slightly from 4.9 in Dec 2010 to 5.3 now (records reviewed). No overt GI bleeding. She has chronic loose stools without any change in bowel habits. No unusual weight loss. Due for surveillance colonoscopy in June but will proceed a little early with that given recent lab findings. The patient will be scheduled for a colonoscopy with biopsies/polypectomy (if indicated).  The risks and benefits of the procedure, as well as alternatives were discussed with the patient and she agrees to proceed. If colonoscopy is unrevealing will schedule her for EGD.   Problem # 2:  CIRRHOSIS (ICD-571.5) Assessment: Comment Only No varices on EGD in November 2010.   Problem # 3:  IRRITABLE BOWEL SYNDROME (ICD-564.1) Assessment: Comment Only  Problem # 4:  DIABETES MELLITUS, TYPE II (ICD-250.00) Assessment: Comment Only  Problem # 5:  TRANSIENT ISCHEMIC ATTACK, HX OF (ICD-V12.50) Assessment: Comment Only On Plavix, will contact PCP about holding for colonoscopy.  Other Orders: Colonoscopy (Colon)  Patient Instructions: 1)  We have schedueld the Colonoscopy with Dr. [name] for 01-09-10. 2)  We will contact you once we hear back from Dr. Posey Rea regarding the Plavix medication. 3)  [facility] Patient Information Guide given to patient. 4)  Colonoscopy brochure given.  5)  We sent the Colonoscopy prep to your pharmacy. 6)  The medication list was reviewed and reconciled.  All changed / newly prescribed medications were explained.  A complete medication list was provided to the patient / caregiver.

## 2010-11-04 NOTE — Miscellaneous (Signed)
Summary: FeSO4 order  Clinical Lists Changes  Medications: Added new medication of FERROUS SULFATE 325 (65 FE) MG  TABS (FERROUS SULFATE) one by mouth two times a day take indefinitely - Signed Rx of FERROUS SULFATE 325 (65 FE) MG  TABS (FERROUS SULFATE) one by mouth two times a day take indefinitely;  #60 x 11;  Signed;  Entered by: Alease Frame RN;  Authorized by: Hilarie Fredrickson MD;  Method used: Electronically to [facility]*, [address], [place], [place], [place]  [address], Ph: [phone], Fax: [phone] Observations: Added new observation of ALLERGY REV: Done (01/09/2010 14:24)    Prescriptions: FERROUS SULFATE 325 (65 FE) MG  TABS (FERROUS SULFATE) one by mouth two times a day take indefinitely  #60 x 11   Entered by:   Alease Frame RN   Authorized by:   Hilarie Fredrickson MD   Signed by:   Alease Frame RN on 01/09/2010   Method used:   Electronically to        [facility]* (retail)       [address]       [place], [place]  [address]       Ph: [phone]       Fax: [phone]   RxID:   [id number]

## 2010-11-04 NOTE — Letter (Signed)
Summary: [facility]  [facility]   Imported By: Sherian Rein 06/25/2010 14:56:31  _____________________________________________________________________  External Attachment:    Type:   Image     Comment:   External Document

## 2010-11-04 NOTE — Letter (Signed)
Summary: [facility]  [facility]   Imported By: Lennie Odor 01/02/2010 14:41:17  _____________________________________________________________________  External Attachment:    Type:   Image     Comment:   External Document

## 2010-11-04 NOTE — Letter (Signed)
[facility]: [facility]   Imported By: Sherian Rein 01/10/2010 15:09:48  _____________________________________________________________________  External Attachment:    Type:   Image     Comment:   External Document

## 2010-11-04 NOTE — Assessment & Plan Note (Signed)
Summary: rov/ mbw   Copy to:  n/a Primary Provider/Referring Provider:  Sula Soda, MD   CC:  Pt states sooner appt than 6-24; Onocolgist states pt is having extreme fatigue(either lung or heart related). .  History of Present Illness:  December 03, 2009- COPD, bronchitis More short of breath gradually. More cough productive of white foamy mucus. Blames some of this on 'allergies". Little sneeze or wheeze. GYN told her irregular heart rate but has not had EKG.  We reviewed CT from [facility]- CT chest showed emphysema and atherosclerosis.  She has home nebulizer with unknown med but hasn't been using it.  January 14, 2010- COPD, bronchitis Breathing was ok during recent colonoscopy. Has been bleeding and elevated  CEA. With hx Right hemicolectomy for adenoca, she has had a lot of testing and been given intravenous iron. She hasn't needed her nebulizer, but needs refill with her rescue inhaler.She had been using Proair a couple of times every other day. Some cough- clear mucus. Some rapid heartbeat noted occasionally. Denies chest pain or fever. sometimes feet may swell a little.  March 18, 2010- COPD, bronchitis, tobacco She admits smoking most of her life and now averages 1/2 PPD. She wants to quit and to discuss methods. Continues iron for hx anemia.  Daily cough, often productive clear mucus, some wheeze. DOE with sustained walking, housekeeping. Uses Proair 4x/day. Has neb but it makes her too jittery. CXR- April- Hypervent but NAD PFT 2009- Moderate obstructive disease.    Preventive Screening-Counseling & Management  Alcohol-Tobacco     Smoking Status: current     Packs/Day: 0.5     Year Started: 1948     Tobacco Counseling: to quit use of tobacco products  Current Medications (verified): 1)  Plavix 75 Mg  Tabs (Clopidogrel Bisulfate) .... Take 1 Tablet By Mouth Once A Day 2)  Potassium Chloride Cr 10 Meq  Cpcr (Potassium Chloride) .... 2 Once Daily 3)  Klonopin 0.5 Mg Tabs  (Clonazepam) .... 2 Tablet By Mouth  At Bedtime 4)  Accu-Chek Compact  Strp (Glucose Blood) .... Once Daily-Bid 5)  Zovirax 5 % Crea (Acyclovir) .... Use Every 2 Hours X 5 Days For Cold Sores 6)  Dilantin 100 Mg Caps (Phenytoin Sodium Extended) .... 2 By Mouth Qhs 7)  Lexapro 20 Mg Tabs (Escitalopram Oxalate) .... One Tablet By Mouth Once Daily At [facility] 8)  Glimepiride 1 Mg Tabs (Glimepiride) .Marland Kitchen.. 1 Tab By Mouth Bid 9)  Vitamin D 1000 Unit Tabs (Cholecalciferol) .Marland Kitchen.. 1 By Mouth Qd 10)  Ibuprofen 600 Mg Tabs (Ibuprofen) .Marland Kitchen.. 1 By Mouth Bid  Pc X 1 Wk Then As Needed For  Pain 11)  Questran 4 Gm Pack (Cholestyramine) .... Use One Packet Two Times A Day 12)  Infed 50 Mg/ml Soln (Iron Dextran) .... [facility] 13)  Ferrous Sulfate 325 (65 Fe) Mg  Tabs (Ferrous Sulfate) .... One By Mouth Two Times A Day Take Indefinitely 14)  Proair Hfa 108 (90 Base) Mcg/act Aers (Albuterol Sulfate) .... 2 Puffs Four Times A Day As Needed Rescue Inhaler 15)  Nexium 40 Mg Cpdr (Esomeprazole Magnesium) .Marland Kitchen.. 1 By Mouth Once Daily  Allergies (verified): 1)  ! Penicillin 2)  Mevacor 3)  Amaryl (Glimepiride) 4)  Metformin Hcl  Past History:  Past Medical History: Last updated: 12/30/2009 IRRITABLE BOWEL SYNDROME (ICD-564.1) Dr [name] DIABETES MELLITUS, TYPE II (ICD-250.00) TRANSIENT ISCHEMIC ATTACK, HX OF (ICD-V12.50) Dr Pearlean Brownie BRONCHITIS (ICD-490) TOBACCO ABUSE (ICD-305.1) ATHEROSCLEROSIS (ICD-440.9)  INSOMNIA, CHRONIC (ICD-307.42 ADENOCARCINOMA, COLON (ICD-153.9) 2008S/P RIGHT HEMICOLECTOMY PERIPHERAL VASCULAR DISEASE (ICD-443.9) DEPRESSION (ICD-311) ANXIETY (ICD-300.00)    Dr Donell Beers RENAL CYST, LEFT (ICD-593.2) SLEEP RELATED MOVEMENT DISORDER UNSPECIFIED (ICD-780.58) CIRRHOSIS   Seizure disorder 2009 Dr Pearlean Brownie GYN Dr Tenny Craw  Past Surgical History: Last updated: 03/08/2009 Carotid endarterectomy EUS (01/07/2006 , and 04/22/2006) EGD (11/19/05) Right hemi-Colectomy   2008 Breast-Lumpectomy  11/09  Family History: Last updated: 12/30/2009 Family History Hypertension Family History of Colon Cancer: Paternal Grandmother Family History of Esophageal Cancer: brother  Social History: Last updated: 02/25/2010 2-3 cigs/ day- rare, occasional- encouraged to stop Divorced with children Occupation: Retired Alcohol use-never Drug use-never Regular exercise-yes chair yoga 2010 Daily Caffeine Use  Risk Factors: Exercise: yes (09/11/2009)  Risk Factors: Smoking Status: current (03/18/2010) Packs/Day: 0.5 (03/18/2010)  Review of Systems      See HPI       The patient complains of dyspnea on exertion.  The patient denies anorexia, fever, weight loss, weight gain, vision loss, decreased hearing, hoarseness, chest pain, syncope, prolonged cough, headaches, hemoptysis, abdominal pain, melena, hematochezia, and severe indigestion/heartburn.    Vital Signs:  Patient profile:   77 year old female Height:      65 inches Weight:      136 pounds BMI:     22.71 O2 Sat:      94 % on Room air Pulse rate:   84 / minute BP sitting:   118 / 70  (left arm) Cuff size:   regular  Vitals Entered By: Reynaldo Minium CMA (March 18, 2010 4:13 PM)  O2 Flow:  Room air CC: Pt states sooner appt than 6-24; Onocolgist states pt is having extreme fatigue(either lung or heart related).    Physical Exam  Additional Exam:  General: A/Ox3; pleasant and cooperative, NAD, gentle little lady SKIN: no rash, lesions NODES: no lymphadenopathy HEENT: [place]/AT, EOM- WNL, Conjuctivae- clear, PERRLA, TM-WNL, Nose- clear, Throat- clear and wnl, dentures, Mallampati  II NECK: Supple w/ fair ROM, JVD- none, normal carotid impulses w/o bruits Thyroid-  CHEST: expiratory wheeze especially left upper lobe, unlabored, no dullness. HEART: RRR- regular to my exam, 2/6 systolic murmur or adjacent subclavian bruit high at aortic space ABDOMEN: Soft and nl; ZOX:WRUE, nl pulses, no edema  NEURO: Grossly intact to  observation      Impression & Recommendations:  Problem # 1:  COPD (ICD-496) Hx of moderate obstruction in 2009. We will get update PFT. Also want to add a maintenance inhaler- try Symbicort.  Problem # 2:  SYSTOLIC MURMUR (AVW-098.1)  We discussed valvular heart disease as a cause of dyspnea. She denies angina or palpitatiion. We will get Echo.  Problem # 3:  TOBACCO ABUSE (ICD-305.1)  We discussed smoking cessation, motivation and support.  Other Orders: Est. Patient Level IV (19147) Echo Referral (Echo) Tobacco use cessation intermediate 3-10 minutes (82956)  Patient Instructions: 1)  Please schedule a follow-up appointment in 1 month. 2)  A 2D Echocardiogram has been recommended.  Your imaging study may require preauthorization. 3)  Schedule PFT 4)  Try adding sample Symbicort 80/4.5- 2 puffs and rinse mouth, twice daily. See if this reduces how much you need to use the Proventil rescue inhaler.

## 2010-11-04 NOTE — Letter (Signed)
[facility]   Imported By: Sherian Rein 02/18/2010 13:19:04  _____________________________________________________________________  External Attachment:    Type:   Image     Comment:   External Document

## 2010-11-05 ENCOUNTER — Encounter: Payer: Self-pay | Admitting: Internal Medicine

## 2010-11-05 ENCOUNTER — Ambulatory Visit (INDEPENDENT_AMBULATORY_CARE_PROVIDER_SITE_OTHER): Payer: Medicare Other | Admitting: Internal Medicine

## 2010-11-05 DIAGNOSIS — J40 Bronchitis, not specified as acute or chronic: Secondary | ICD-10-CM

## 2010-11-05 DIAGNOSIS — F17200 Nicotine dependence, unspecified, uncomplicated: Secondary | ICD-10-CM

## 2010-11-06 NOTE — Miscellaneous (Signed)
Summary: clotest  Clinical Lists Changes  Orders: Added new Test order of TLB-H Pylori Screen Gastric Biopsy (83013-CLOTEST) - Signed 

## 2010-11-06 NOTE — Progress Notes (Signed)
Summary: ? re rescheduling todays procedure  Phone Note Call from Patient Call back at [phone]   Caller: Daughter [name] Call For: Dr [name] Summary of Call: Daughter called today to let us know that her mother feels a lot worse this morning has not been able to get up from bed but is not running a temp. Patient is scheduled for procedure this afternoon and wants to know if she should still come or can she reschedule for next available. Initial call taken by: Tawni Levy,  October 01, 2010 10:28 AM  Follow-up for Phone Call        ok to reschedule if she's not feeling well. please let me know if she does. Also, let [facility] staff know. Thanks Follow-up by: Hilarie Fredrickson MD,  October 01, 2010 10:55 AM  Additional Follow-up for Phone Call Additional follow up Details #1::        Patient rescheduled for 10-09-10 @ 3:30pm Additional Follow-up by: Tawni Levy,  October 01, 2010 11:09 AM    Additional Follow-up for Phone Call Additional follow up Details #2::    thanks Follow-up by: Hilarie Fredrickson MD,  October 01, 2010 11:26 AM

## 2010-11-06 NOTE — Assessment & Plan Note (Signed)
Summary: rov 2 months///kp   Copy to:  n/a Primary Provider/Referring Provider:  Sula Soda, MD   CC:  2 month follow up visit-COPD; no complaints of any increased SOB.Marland Kitchen  History of Present Illness:  April 18, 2010- COPD, bronchitis, tobacco Trying to stop smoking. comes to review PFT and also the echocardiogram done with known murmur, with question of pulmonary vs cardiac exercise limitation. She keeps some cough with white phlegm but denies chest pain, palpitation or syncope. Feet swell little if at all. PFT- moderate COPD with emphysema, little dilator response. FEV1/FVC 0.50, FEV1 1.32/ 69%. ECHO- EF 60%, Mod AS, mild LVH and mild Diastolic dysfunction. She wants to see a specific [facility] cardiologist and will call us back with the name. She had fired her previous cardiologist.  July 14, 2010- COPD,  bronchits, tobacco/ quit cc:6 month follow up visit-COPD-pt has quit smoking!  She is noticing an improvement in her cough. Discussed visit to Neurology- suspected vertebrobasilar ischemia. She admits wheeze although she doesn't hear well. She doesn't seem to notice much dyspnea with ADLs and deneies discolored sputum, chest pain, palpitation. She is rarely using her rescue inhaler now.  September 15, 2010-  COPD,  bronchits, tobacco/ quit Nurse-CC: 2 month follow up visit-COPD; no complaints of any increased SOB. Less congested recently. She continues Symbicort and rartely needs her rescue inhaler.  She has not been coughing as much and no phlegm. She is using an electric cigarette a little and it has permitted her to get off cigarettes. She is very pleased with Dr Eden Emms and feels that her heart is doing well.   Preventive Screening-Counseling & Management  Alcohol-Tobacco     Smoking Status: quit < 6 months     Smoking Cessation Counseling: yes     Packs/Day: <0.25     Year Started: 1948     Year Quit: 05-2010     Pack years: 16yrs, 1/2 ppd     Tobacco Counseling: to remain off  tobacco products  Current Medications (verified): 1)  Plavix 75 Mg  Tabs (Clopidogrel Bisulfate) .... Take 1 Tablet By Mouth Once A Day 2)  Potassium Chloride Cr 10 Meq  Cpcr (Potassium Chloride) .... 2 Once Daily 3)  Klonopin 0.5 Mg Tabs (Clonazepam) .... 2 Tablet By Mouth  At Bedtime 4)  Accu-Chek Compact  Strp (Glucose Blood) .... Once Daily-Bid 5)  Dilantin 100 Mg Caps (Phenytoin Sodium Extended) .... 2 By Mouth Qhs 6)  Lexapro 20 Mg Tabs (Escitalopram Oxalate) .... One Tablet By Mouth Once Daily At [facility] 7)  Glimepiride 1 Mg Tabs (Glimepiride) .Marland Kitchen.. 1 Tab By Mouth Bid 8)  Vitamin D 1000 Unit Tabs (Cholecalciferol) .Marland Kitchen.. 1 By Mouth Qd 9)  Ibuprofen 600 Mg Tabs (Ibuprofen) .Marland Kitchen.. 1 By Mouth Bid  Pc X 1 Wk Then As Needed For  Pain 10)  Infed 50 Mg/ml Soln (Iron Dextran) .... [facility] 11)  Ferrous Sulfate 325 (65 Fe) Mg  Tabs (Ferrous Sulfate) .... One By Mouth Two Times A Day Take Indefinitely 12)  Proair Hfa 108 (90 Base) Mcg/act Aers (Albuterol Sulfate) .... 2 Puffs Four Times A Day As Needed Rescue Inhaler 13)  Neurontin 300 Mg Caps (Gabapentin) .... Take 1 Capsule By Mouth Once Daily 14)  Symbicort 80-4.5 Mcg/act Aero (Budesonide-Formoterol Fumarate) .... 2 Puffs and Rinse Mouth, Twice Daily 15)  Pennsaid 1.5 % Soln (Diclofenac Sodium) .... 3-5 Gtt On Skin Three Times A Day For Pain  Allergies (verified): 1)  !  Penicillin 2)  Mevacor 3)  Metformin Hcl  Past History:  Past Surgical History: Last updated: 03/08/2009 Carotid endarterectomy EUS (01/07/2006 , and 04/22/2006) EGD (11/19/05) Right hemi-Colectomy   2008 Breast-Lumpectomy 11/09  Family History: Last updated: 12/30/2009 Family History Hypertension Family History of Colon Cancer: Paternal Grandmother Family History of Esophageal Cancer: brother  Social History: Last updated: 02/25/2010 2-3 cigs/ day- rare, occasional- encouraged to stop Divorced with children Occupation: Retired Alcohol  use-never Drug use-never Regular exercise-yes chair yoga 2010 Daily Caffeine Use  Risk Factors: Exercise: yes (09/11/2009)  Risk Factors: Smoking Status: quit < 6 months (09/15/2010) Packs/Day: <0.25 (09/15/2010)  Past Medical History: Aortic Stenosis: moderate by echo 6/11 gradient 20/36 mmHg for mean and peak TRANSIENT ISCHEMIC ATTACK Dr Pearlean Brownie PERIPHERAL VASCULAR DISEASE  ADENOCARCINOMA, COLON 2008S/P RIGHT HEMICOLECTOMY IRRITABLE BOWEL SYNDROME  Dr [name] DIABETES MELLITUS, TYPE II   Chronic BRONCHITIS  COPD ATHEROSCLEROSIS DEPRESSION  ANXIETY    Dr Donell Beers RENAL CYST, LEFT  SLEEP RELATED MOVEMENT DISORDER UNSPECIFIED  CIRRHOSIS Seizure disorder 2009 Dr Pearlean Brownie GYN Dr Tenny Craw ANEMIA, IRON DEFICIENCY  CELLULITIS, ANKLE, LEFT  BREAST PAIN  ABNORMAL FINDINGS GI TRACT PERSONAL HX COLONIC POLYPS  DIVERTICULOSIS-COLON  PERSONAL HX COLON CANCER BELCHING  INFECTIOUS DIARRHEA SINUSITIS, ACUTE ABNORMAL LIVER FUNCTION TESTS  THRUSH FATIGUE  GERD ATHEROSCLEROSIS  INSOMNIA, CHRONIC RENAL CYST, LEFT   Review of Systems      See HPI       The patient complains of shortness of breath with activity.  The patient denies shortness of breath at rest, productive cough, non-productive cough, coughing up blood, chest pain, irregular heartbeats, acid heartburn, indigestion, loss of appetite, weight change, abdominal pain, difficulty swallowing, sore throat, tooth/dental problems, headaches, nasal congestion/difficulty breathing through nose, sneezing, change in color of mucus, and fever.    Vital Signs:  Patient profile:   77 year old female Height:      65 inches Weight:      145.38 pounds BMI:     24.28 O2 Sat:      95 % on Room air Pulse rate:   81 / minute BP sitting:   124 / 74  (left arm) Cuff size:   regular  Vitals Entered By: Reynaldo Minium CMA (September 15, 2010 3:12 PM)  O2 Flow:  Room air CC: 2 month follow up visit-COPD; no complaints of any increased  SOB.   Physical Exam  Additional Exam:  General: A/Ox3; pleasant and cooperative, NAD, gentle little lady SKIN: no rash, lesions NODES: no lymphadenopathy HEENT: [place]/AT, EOM- WNL, Conjuctivae- clear, PERRLA, TM-WNL, Nose- clear, Throat- clear and wnl, dentures, Mallampati  II NECK: Supple w/ fair ROM, JVD- none, normal carotid impulses w/o bruits Thyroid-  CHEST:Clear wit no rhonchi or wheeze today.  HEART: RRR- regular to my exam,1- 2/6 systolic murmur high at aortic space ABDOMEN: nontender ZOX:WRUE, nl pulses, no edema  NEURO: Grossly intact to observation      Impression & Recommendations:  Problem # 1:  COPD (ICD-496) She seems to be doing much better. Hopefullly this reflects the reduction in her smoking, which we discussed again.  Symbicort has held her well. We don't identify new needs.   Other Orders: Est. Patient Level III (45409)  Patient Instructions: 1)  Please schedule a follow-up appointment in 2 months. Please call sooner if you need me.  2)  i am so proud that you are able to get away from the cigarettes.

## 2010-11-06 NOTE — Letter (Signed)
Summary: Diabetic Instructions  [facility]  [address] [place], [place] [address]   Phone: [phone]  Fax: [phone]    Monique Fleming Nov 11, 1928 MRN: [id number]   x ORAL DIABETIC MEDICATION INSTRUCTIONS  The day before your procedure:   Take your diabetic pill as you do normally    The day of your procedure:   Do not take your diabetic pill    We will check your blood sugar levels during the admission process and again in Recovery before discharging you home  ________________________________________________________________________  _  _   INSULIN (LONG ACTING) MEDICATION INSTRUCTIONS (Lantus, NPH, 70/30, Humulin, Novolin-N)   The day before your procedure:   Take  your regular evening dose    The day of your procedure:   Do not take your morning dose    _  _   INSULIN (SHORT ACTING) MEDICATION INSTRUCTIONS (Regular, Humulog, Novolog)   The day before your procedure:   Do not take your evening dose   The day of your procedure:   Do not take your morning dose   _  _   INSULIN PUMP MEDICATION INSTRUCTIONS  We will contact the physician managing your diabetic care for written dosage instructions for the day before your procedure and the day of your procedure.  Once we have received the instructions, we will contact you.

## 2010-11-06 NOTE — Progress Notes (Signed)
Summary: Procedure ?s  Phone Note Call from Patient Call back at Home Phone [phone]   Caller: Patient Call For: Dr. [name] Reason for Call: Talk to Nurse Summary of Call: Pt has egd tomorrow and she is starting to feel bad with a sore throat, wants to know if this will effect her procedure Initial call taken by: [place] Johnson,  September 30, 2010 4:13 PM  Follow-up for Phone Call        pt. was concerned whether or not the Dr. would put her to sleep with a sore throat and nose running per pt. asked if she had a temperature she stated that she had not checked instructed pt. to call us if she starts having a temp or if she gets worse, but at this point and time we are still able to do her procedure, pt. verbalized understanding. Follow-up by: Greer Ee RN,  September 30, 2010 4:27 PM

## 2010-11-06 NOTE — Assessment & Plan Note (Signed)
Summary: 6 month rov. need echo sameday/sl   Referring Provider:  n/a Primary Provider:  Sonda Primes, MD  CC:  pt complians of sinus infection..follow up echo.  History of Present Illness: Monique Fleming is seen today at the request of Dr Maple Hudson.  She has fatigue, SOB and murmur.  I have seen her daughter [name] in the past.  She was hospitalized in 2008 and seen by Dr Teressa Lower.  She did not like the encounter and is deathly afraid of having a heart cath. Her husband died at [facility] after CABG and it appears that it was from a mistake with chest tube placement.  Her SOB is from COPD and ongoing smoking.  She is trying to use nicorette gum and choose a quit date.  Recent PFT's show moderate obstruction.  She has had a long standing murmur.  Echo done 03/26/10 showed a mean gradient of 20 and peak of 32 mmHg.  EF is normal.  There is no history of CAD.  She has had a previous LCEA and recent duplex at [facility] indicating no new carotid disease.  She denies SSCP, palpitations, syncope or edema.  Chronic SOB with no cough or wheezing  Reviewed Echo from today with no change.  Mean gradient 21 mmg Hg and peak 33 mmHG.  Normla EF  Has had some URI with cough.     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) 11)  Anemia-b12 Deficiency  (ICD-281.1) 12)  Anemia, Iron Deficiency  (ICD-280.9) 13)  Cellulitis, Ankle, Left  (ICD-682.6) 14)  Breast Pain  (ICD-611.71) 15)  Abnormal Findings Gi Tract  (ICD-793.4) 16)  Cirrhosis  (ICD-571.5) 17)  Personal Hx Colonic Polyps  (ICD-V12.72) 18)  Diverticulosis-colon  (ICD-562.10) 19)  Personal Hx Colon Cancer   (ICD-V10.05) 20)  Diarrhea  (ICD-787.91) 21)  Belching  (ICD-787.3) 22)  Infectious Diarrhea  (ICD-009.2) 23)  Unspecified Breast Disorder  (ICD-611.9) 24)  Dysuria  (ICD-788.1) 25)  Seizure Disorder  (ICD-780.39) 26)  Elbow Pain  (ICD-719.42) 27)  Confusion  (ICD-298.9) 28)  Sinusitis, Acute  (ICD-461.9) 29)  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) 30)  Upper Respiratory Infection (URI)  (ICD-465.9) 31)  Neck Pain  (ICD-723.1) 32)  Abnormal Liver Function Tests  (ICD-794.8) 33)  Thrush  (ICD-112.0) 34)  Fatigue  (ICD-780.79) 35)  Gerd  (ICD-530.81) 36)  Shoulder Pain  (ICD-719.41) 37)  Irritable Bowel Syndrome  (ICD-564.1) 38)  Diabetes Mellitus, Type II  (ICD-250.00) 39)  COPD  (ICD-496) 40)  Bronchitis  (ICD-490) 41)  Tobacco Abuse  (ICD-305.1) 42)  Atherosclerosis  (ICD-440.9) 43)  Insomnia, Chronic  (ICD-307.42) 44)  Adenocarcinoma, Colon  (ICD-153.9) 45)  Depression  (ICD-311) 46)  Anxiety  (ICD-300.00) 47)  Renal Cyst, Left  (ICD-593.2) 48)  Sleep Related Movement Disorder Unspecified  (ICD-780.58) 49)  Seizures, Hx of  (ICD-V12.49)  Current Medications (verified): 1)  Plavix 75 Mg  Tabs (Clopidogrel Bisulfate) .... Take 1 Tablet By Mouth Once A Day 2)  Potassium Chloride Cr 10 Meq  Cpcr (Potassium Chloride) .... 2 Once Daily 3)  Klonopin 0.5 Mg Tabs (Clonazepam) .... 2 Tablet By Mouth  At Bedtime 4)  Accu-Chek Compact  Strp (Glucose Blood) .... Once Daily-Bid 5)  Dilantin 100 Mg Caps (Phenytoin Sodium Extended) .... 2 By Mouth Qhs 6)  Lexapro 20 Mg Tabs (Escitalopram Oxalate) .... One Tablet By Mouth Once Daily At [facility] 7)  Glimepiride 1 Mg Tabs (Glimepiride) .Marland Kitchen.. 1 Tab By Mouth Once Daily 8)  Vitamin D 1000 Unit Tabs (Cholecalciferol) .Marland Kitchen.. 1 By Mouth Qd 9)  Ibuprofen 600 Mg Tabs (Ibuprofen) .Marland Kitchen.. 1 By Mouth As Needed 10)  Ferrous Sulfate 325 (65 Fe) Mg  Tabs (Ferrous Sulfate) .... One By Mouth Two Times A Day Take Indefinitely 11)  Proair Hfa 108 (90 Base) Mcg/act Aers  (Albuterol Sulfate) .... 2 Puffs Twice A Day As Needed Rescue Inhaler 12)  Neurontin 300 Mg Caps (Gabapentin) .... Take 1 Capsule By Mouth Once Daily 13)  Symbicort 80-4.5 Mcg/act Aero (Budesonide-Formoterol Fumarate) .... 2 Puffs and Rinse Mouth, Twice Daily 14)  Nexium 40 Mg Cpdr (Esomeprazole Magnesium) .Marland Kitchen.. 1 Tab By Mouth 30 Minutes Before Meals  Allergies (verified): 1)  ! Penicillin 2)  Mevacor 3)  Metformin Hcl  Past History:  Past Medical History: Last updated: 09/15/2010 Aortic Stenosis: moderate by echo 6/11 gradient 20/36 mmHg for mean and peak TRANSIENT ISCHEMIC ATTACK Dr Pearlean Brownie PERIPHERAL VASCULAR DISEASE  ADENOCARCINOMA, COLON 2008S/P RIGHT HEMICOLECTOMY IRRITABLE BOWEL SYNDROME  Dr [name] DIABETES MELLITUS, TYPE II   Chronic BRONCHITIS  COPD ATHEROSCLEROSIS DEPRESSION  ANXIETY    Dr Donell Beers RENAL CYST, LEFT  SLEEP RELATED MOVEMENT DISORDER UNSPECIFIED  CIRRHOSIS Seizure disorder 2009 Dr Pearlean Brownie GYN Dr Tenny Craw ANEMIA, IRON DEFICIENCY  CELLULITIS, ANKLE, LEFT  BREAST PAIN  ABNORMAL FINDINGS GI TRACT PERSONAL HX COLONIC POLYPS  DIVERTICULOSIS-COLON  PERSONAL HX COLON CANCER BELCHING  INFECTIOUS DIARRHEA SINUSITIS, ACUTE ABNORMAL LIVER FUNCTION TESTS  THRUSH FATIGUE  GERD ATHEROSCLEROSIS  INSOMNIA, CHRONIC RENAL CYST, LEFT   Past Surgical History: Last updated: 09/17/2010 Carotid endarterectomy EUS (01/07/2006 , and 04/22/2006) EGD (11/19/05) Right hemi-Colectomy   2008 Right Breast-Lumpectomy 11/09 Cholecystectomy  Family History: Last updated: 09/17/2010 Family History Hypertension Family History of Colon Cancer: Paternal Grandmother Family History of Esophageal Cancer: brother Family History of Heart Disease: Mother Family History of Diabetes: Bother  Social History: Last updated: 09/17/2010 2-3 cigs/ day- rare, occasional- stopped...Marland Kitchennow on electronic cigarrettes Divorced with children Occupation: Retired Alcohol use-never Drug  use-never Regular exercise-yes chair yoga 2010 Daily Caffeine Use  Review of Systems       Denies fever, malais, weight loss, blurry vision, decreased visual acuity, cough, sputum,  hemoptysis, pleuritic pain, palpitaitons, heartburn, abdominal pain, melena, lower extremity edema, claudication, or rash.   Vital Signs:  Patient profile:   77 year old female Height:      65 inches Weight:      141 pounds BMI:     23.55 Pulse rate:   90 / minute Resp:     16 per minute BP sitting:   124 / 70  (left arm)  Vitals Entered By: Kem Parkinson (October 21, 2010 4:21 PM)   Physical Exam  General:  Affect appropriate Healthy:  appears stated age HEENT: normal Neck supple with no adenopathy JVP normal no bruits no thyromegaly Lungs clear with no wheezing and good diaphragmatic motion Heart:  S1/S2 preserved with AS  murmur, no rub, gallop or click PMI normal Abdomen: benighn, BS positve, no tenderness, no AAA no bruit.  No HSM or HJR Distal pulses intact with no bruits No edema Neuro non-focal Skin warm and dry    Impression &  Recommendations:  Problem # 1:  TRANSIENT ISCHEMIC ATTACK, HX OF (ICD-V12.50) Non recurrent.  Continue Plavix per neuro  Problem # 2:  SYSTOLIC MURMUR (TDD-220.2) Mild to moderat AS stable by echo today.  Hopefully will never progress to surgical disease as she would be terrified by this  Problem # 3:  COPD (ICD-496) URI with chronic dyspnea.  Indicates smoking cessation using electronic cigaretts Her updated medication list for this problem includes:    Proair Hfa 108 (90 Base) Mcg/act Aers (Albuterol sulfate) .Marland Kitchen... 2 puffs twice a day as needed rescue inhaler    Symbicort 80-4.5 Mcg/act Aero (Budesonide-formoterol fumarate) .Marland Kitchen... 2 puffs and rinse mouth, twice daily  Patient Instructions: 1)  Your physician wants you to follow-up in: 6 MONTHS  You will receive a reminder letter in the mail two months in advance. If you don't receive a letter,  please call our office to schedule the follow-up appointment.

## 2010-11-06 NOTE — Progress Notes (Signed)
Summary: sick  Phone Note Call from Patient   Caller: Daughter--elizabeth-[phone] Call For: young Reason for Call: Acute Illness, Talk to Nurse Summary of Call: Patient's daughter, [name] is calling for mother.  She thinks she needs an abx.  Patient started yesterday with sinus pressure, got progessively worse over night.  This am she has sore throat, cough w/ clear sputum, mucus yellow. [facility] [place] Initial call taken by: Lehman Prom,  October 01, 2010 10:37 AM  Follow-up for Phone Call        Pt's daughter says pt has sinus pressure, yellow nasal drainage, cough w/ clear sputum, slight wheezing x1 day. The daughter believes pt needs an abx. She is reluctant to try any Mucinex or Sudafed OTC due to prior seizure after using Sudafed, although this was never confirmed as the cause of the seizure. Pls advise of recs for this pt. Allergies (verified):  1)  ! Penicillin 2)  Mevacor 3)  Metformin Hcl Follow-up by: Michel Bickers CMA,  October 01, 2010 11:05 AM  Additional Follow-up for Phone Call Additional follow up Details #1::        Per CDY-okay to give Zpak-pts daugher is aware that RX has been sent to [facility] store.Reynaldo Minium CMA  October 01, 2010 2:14 PM     New/Updated Medications: ZITHROMAX Z-PAK 250 MG TABS (AZITHROMYCIN) take as directed Prescriptions: ZITHROMAX Z-PAK 250 MG TABS (AZITHROMYCIN) take as directed  #1 pak x 0   Entered by:   Reynaldo Minium CMA   Authorized by:   Waymon Budge MD   Signed by:   Reynaldo Minium CMA on 10/01/2010   Method used:   Electronically to        [facility] (retail)       [address]       [place], [place]  [address]       Ph: [phone]       Fax: [phone]   RxID:   [id number]

## 2010-11-06 NOTE — Miscellaneous (Signed)
Summary: Protonix prescription  Clinical Lists Changes  Medications: Added new medication of PROTONIX 40 MG  TBEC (PANTOPRAZOLE SODIUM) 1 each day 30 minutes before meal - Signed Rx of PROTONIX 40 MG  TBEC (PANTOPRAZOLE SODIUM) 1 each day 30 minutes before meal;  #30 x 11;  Signed;  Entered by: Laverna Peace RN;  Authorized by: Hilarie Fredrickson MD;  Method used: Electronically to [facility] [address], [place], [place], [place]  [address], Ph: [phone], Fax: [phone]    Prescriptions: PROTONIX 40 MG  TBEC (PANTOPRAZOLE SODIUM) 1 each day 30 minutes before meal  #30 x 11   Entered by:   Laverna Peace RN   Authorized by:   Hilarie Fredrickson MD   Signed by:   Laverna Peace RN on 10/09/2010   Method used:   Electronically to        [facility] (retail)       [address]       [place], [place]  [address]       Ph: [phone]       Fax: [phone]   RxID:   [id number]   Appended Document: Protonix prescription Called French Ana and she states that patient requested that she be placed back on Pantoprazole, the Prilosec did not work.  It was documented in patient's [facility] chart and ok'd by Dr. [name].

## 2010-11-06 NOTE — Letter (Signed)
Summary: EGD Instructions  [facility]  [address] [place], [place] [address]   Phone: [phone]  Fax: [phone]       Monique Fleming    24-Feb-1929    MRN: [id number]       Procedure Day /Date:WEDNESDAY, 10/01/10     Arrival Time: 2:30 PM       Procedure Time:3:30 PM     Location of Procedure:                    [facility] (4th Floor)   PREPARATION FOR ENDOSCOPY   On WEDNESDAY, 10/01/10 THE DAY OF THE PROCEDURE:  1.   No solid foods, milk or milk products are allowed after midnight the night before your procedure.  2.   Do not drink anything colored red or purple.  Avoid juices with pulp.  No orange juice.  3.  You may drink clear liquids until 1:30 PM, which is 2 hours before your procedure.                                                                                                CLEAR LIQUIDS INCLUDE: Water Jello Ice Popsicles Tea (sugar ok, no milk/cream) Powdered fruit flavored drinks Coffee (sugar ok, no milk/cream) Gatorade Juice: apple, white grape, white cranberry  Lemonade Clear bullion, consomm, broth Carbonated beverages (any kind) Strained chicken noodle soup Hard Candy   MEDICATION INSTRUCTIONS  Unless otherwise instructed, you should take regular prescription medications with a small sip of water as early as possible the morning of your procedure.  Diabetic patients - see separate instructions.  STAY ON PLAVIX PER DR. PERRY           OTHER INSTRUCTIONS  You will need a responsible adult at least 75 years of age to accompany you and drive you home.   This person must remain in the waiting room during your procedure.  Wear loose fitting clothing that is easily removed.  Leave jewelry and other valuables at home.  However, you may wish to bring a book to read or an iPod/MP3 player to listen to music as you wait for your procedure to start.  Remove all body piercing jewelry and leave at  home.  Total time from sign-in until discharge is approximately 2-3 hours.  You should go home directly after your procedure and rest.  You can resume normal activities the day after your procedure.  The day of your procedure you should not:   Drive   Make legal decisions   Operate machinery   Drink alcohol   Return to work  You will receive specific instructions about eating, activities and medications before you leave.    The above instructions have been reviewed and explained to me by   _______________________    I fully understand and can verbalize these instructions _____________________________ Date _________

## 2010-11-06 NOTE — Assessment & Plan Note (Signed)
Summary: 6 month followup-Cirrhosis, GERD, history of colon cancer   History of Present Illness Visit Type: Follow-up Visit Primary GI MD: Yancey Flemings MD Primary Provider: Sonda Primes, MD Requesting Provider: n/a Chief Complaint: Patient here for 6 month f/u cirrhosis and GERD. She states that she does have some bloating and occasional RUQ abdominal discomfort. She also has GERD symptoms. 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 She was last seen in May of 2011. See that dictation. Overall she has been doing well. She is no longer taking PPI for GERD. No obvious complications of liver disease such as edema, ascites, bleeding, or encephalopathy. Laboratories from last month revealed mild elevation of hepatic transaminases with normal bilirubin and normal albumin. She does complain of GERD symptoms. Also chronic unchanged bloating and intermittent right upper quadrant discomfort.   GI Review of Systems    Reports abdominal pain, acid reflux, bloating, heartburn, and  nausea.     Location of  Abdominal pain: RUQ.    Denies belching, chest pain, dysphagia with liquids, dysphagia with solids, loss of appetite, vomiting, vomiting blood, weight loss, and  weight gain.      Reports diarrhea, diverticulosis, hemorrhoids, irritable bowel syndrome, and  liver problems.     Denies anal fissure, black tarry stools, change in bowel habit, constipation, fecal incontinence, heme  positive stool, jaundice, light color stool, rectal bleeding, and  rectal pain.    Current Medications (verified): 1)  Plavix 75 Mg  Tabs (Clopidogrel Bisulfate) .... Take 1 Tablet By Mouth Once A Day 2)  Potassium Chloride Cr 10 Meq  Cpcr (Potassium Chloride) .... 2 Once Daily 3)  Klonopin 0.5 Mg Tabs (Clonazepam) .... 2 Tablet By Mouth  At Bedtime 4)  Accu-Chek Compact  Strp (Glucose Blood) .... Once Daily-Bid 5)  Dilantin 100 Mg Caps (Phenytoin Sodium Extended) .... 2 By Mouth Qhs 6)  Lexapro 20 Mg Tabs (Escitalopram Oxalate) .... One Tablet By Mouth Once Daily At [facility] 7)  Glimepiride 1 Mg Tabs (Glimepiride) .Marland Kitchen.. 1 Tab By Mouth Once Daily 8)  Vitamin D 1000 Unit Tabs (Cholecalciferol) .Marland Kitchen.. 1 By Mouth Qd 9)  Ibuprofen 600 Mg Tabs (Ibuprofen) .Marland Kitchen.. 1 By Mouth As Needed 10)  Ferrous Sulfate 325 (65 Fe) Mg  Tabs (Ferrous Sulfate) .... One By Mouth Two Times A Day Take Indefinitely 11)  Proair Hfa 108 (90 Base) Mcg/act Aers (Albuterol Sulfate) .... 2 Puffs Twice A Day As Needed Rescue Inhaler 12)  Neurontin 300 Mg Caps (Gabapentin) .... Take 1 Capsule By Mouth Once Daily 13)  Symbicort 80-4.5 Mcg/act Aero (Budesonide-Formoterol Fumarate) .... 2 Puffs and Rinse Mouth, Twice Daily  Allergies (verified): 1)  ! Penicillin 2)  Mevacor 3)  Metformin Hcl  Past History:  Past Medical History: Reviewed history from 04/22/2010 and no changes required. AS: moderate by echo 6/11 gradient 20/36 mmHg for mean and peak TRANSIENT ISCHEMIC ATTACK Dr Pearlean Brownie PERIPHERAL VASCULAR DISEASE  SYSTOLIC MURMUR  ADENOCARCINOMA, COLON 2008S/P RIGHT HEMICOLECTOMY IRRITABLE BOWEL SYNDROME  Dr [name] DIABETES MELLITUS, TYPE II  BRONCHITIS  ATHEROSCLEROSIS DEPRESSION  ANXIETY    Dr Donell Beers RENAL CYST,  LEFT  SLEEP RELATED MOVEMENT DISORDER UNSPECIFIED  CIRRHOSIS AORTIC Stenosis- moderate, ECHO 2011  Seizure disorder 2009 Dr Pearlean Brownie GYN Dr Tenny Craw ANEMIA, IRON DEFICIENCY  CELLULITIS, ANKLE, LEFT  BREAST PAIN    ABNORMAL FINDINGS GI TRACT PERSONAL HX COLONIC POLYPS  DIVERTICULOSIS-COLON  PERSONAL HX COLON CANCER BELCHING  INFECTIOUS DIARRHEA SINUSITIS, ACUTE COPD  ABNORMAL LIVER FUNCTION TESTS  THRUSH FATIGUE  GERD BRONCHITIS ATHEROSCLEROSIS  INSOMNIA, CHRONIC RENAL CYST, LEFT   Past Surgical History: Carotid endarterectomy EUS (01/07/2006 , and 04/22/2006) EGD (11/19/05) Right hemi-Colectomy   2008 Right Breast-Lumpectomy 11/09 Cholecystectomy  Family History: Family History Hypertension Family History of Colon Cancer: Paternal Grandmother Family History of Esophageal Cancer: brother Family History of Heart Disease: Mother Family History of Diabetes: Bother  Social History: Reviewed history from 02/25/2010 and no changes required. 2-3 cigs/ day- rare, occasional- stopped...Marland Kitchennow on electronic cigarrettes Divorced with children Occupation: Retired Alcohol use-never Drug use-never Regular exercise-yes chair yoga 2010 Daily Caffeine Use  Review of Systems       The patient complains of arthritis/joint pain, fatigue, hearing problems, muscle pains/cramps, shortness of breath, skin rash, sleeping problems, swelling of feet/legs, urination - excessive, and urine leakage.  The patient denies allergy/sinus, anemia, anxiety-new, back pain, blood in urine, breast changes/lumps, change in vision, confusion, cough, coughing up blood, depression-new, fainting, fever, headaches-new, heart murmur, heart rhythm changes, itching, menstrual pain, night sweats, nosebleeds, pregnancy symptoms, sore throat, swollen lymph glands, thirst - excessive , urination - excessive , urination changes/pain, vision changes, and voice change.    Vital Signs:  Patient profile:   77 year old female Height:      65 inches Weight:      144.13 pounds BMI:     24.07 BSA:     1.72 Pulse rate:   92 / minute Pulse rhythm:   regular BP sitting:   124 / 64  (left arm)  Vitals Entered By: Lamona Curl CMA  Duncan Dull) (September 17, 2010 11:22 AM)  Physical Exam  General:  Well developed, well nourished, no acute distress. Eyes:  anicteric Ears:  hard of hearing Mouth:  no thrush Lungs:  Clear throughout to auscultation. Heart:  Regular rate and rhythm; no murmurs, rubs,  or bruits. Abdomen:  Soft, nontender and nondistended. No masses, hepatosplenomegaly or hernias noted. Normal bowel sounds. No ascites. Msk:  Symmetrical with no gross deformities. Normal posture. Pulses:  Normal pulses noted. Extremities:  no edema Neurologic:  Alert and  oriented x4;  grossly normal neurologically. No asterixis. Skin:  ecchymoses Psych:  Alert and cooperative. Normal mood and affect.   Impression & Recommendations:  Problem # 1:  CIRRHOSIS (ICD-571.5) compensated cryptogenic cirrhosis.  Plan: #1. Due for screening endoscopy for varices. Negative exam one year previous. The nature of the procedure as well as the risks, benefits, and alternatives have been reviewed. She understood and agreed to proceed. #2. Adjust diabetic medications preprocedure. See below  Problem # 2:  GERD (ICD-530.81) recurrent symptoms off PPI  Plan: #1. Reflux precautions #2. Prilosec OTC 20 mg daily  Problem # 3:  PERSONAL HX COLONIC POLYPS (ICD-V12.72) surveillance up-to-date  Problem # 4:  ADENOCARCINOMA, COLON (ICD-153.9) surveillance up-to-date  Problem # 5:  DIABETES MELLITUS, TYPE II (ICD-250.00) hold diabetic medications the day of her examination till she resumes normal p.o. intake in order to avoid unwanted hypoglycemia. Monitor blood sugars immediately before and after her procedure per protocol  Other Orders: EGD (EGD)  Patient Instructions: 1)  EGD [facility] 12/28/113:30 pm  arrive at 2:30 pm 2)  Upper Endoscopy brochure given.  3)  Copy sent to : Sonda Primes, MD 4)  The medication list was reviewed and reconciled.  All changed / newly prescribed medications were explained.  A complete medication list was  provided to the patient / caregiver.

## 2010-11-06 NOTE — Procedures (Addendum)
Summary: Upper Endoscopy  Patient: Monique Fleming Note: All result statuses are Final unless otherwise noted.  Tests: (1) Upper Endoscopy (EGD)   EGD Upper Endoscopy       DONE     [facility]     [address]     [place], [place]  [address]           ENDOSCOPY PROCEDURE REPORT           PATIENT:  Monique Fleming, Monique Fleming  MR#:  [id number]     BIRTHDATE:  [birth year], 81 yrs. old  GENDER:  female           ENDOSCOPIST:  Wilhemina Bonito. Eda Keys, MD     Referred by:  Screening / Recall           PROCEDURE DATE:  10/09/2010     PROCEDURE:  EGD with biopsy, 432[address]     ASA CLASS:  Class III     INDICATIONS:  Evaluate for esophageal varices in a patient with     portal hypertension and/or cirrhosis. ; last exam 08-2009 was     negative           MEDICATIONS:   Fentanyl 75 mcg IV, Versed 7 mg IV     TOPICAL ANESTHETIC:  Exactacain Spray           DESCRIPTION OF PROCEDURE:   After the risks benefits and     alternatives of the procedure were thoroughly explained, informed     consent was obtained.  The LB GIF-H180 [id number] endoscope was     introduced through the mouth and advanced to the second portion of     the duodenum, without limitations.  The instrument was slowly     withdrawn as the mucosa was fully examined.     <<PROCEDUREIMAGES>>           The esophagus and gastroesophageal junction were completely normal     in appearance.NO VARICES.  The stomach was entered and closely     examined. The antrum, angularis, and lesser curvature were well     visualized, including a retroflexed view of the cardia and fundus.     The stomach wall was normally distensable. The scope passed easily     through the pylorus into the duodenum.  Duodenitis was found in     the bulb. Clo bx taken..  Several small nonbleeding AVMs in the     second portion of the duodenum.    Retroflexed views revealed no     abnormalities.    The scope was then withdrawn from the patient     and the procedure completed.          COMPLICATIONS:  None           ENDOSCOPIC IMPRESSION:     1) Normal esophagus. No varices     2) Normal stomach     3) Duodenitis     4) AVMs in the second portion duodenum           RECOMMENDATIONS:     1) Rx CLO if positive     2) Prilosec OTC 20 mg daily for ulcer prevention     3) Repeat upper endoscopy in one year           ______________________________     Wilhemina Bonito. Eda Keys, MD           CC:  Michele Mcalpine, MD, The Patient  n.     eSIGNED:   Wilhemina Bonito. Eda Keys at 10/09/2010 03:29 PM           Fanny Dance, [id number]  Note: An exclamation mark (!) indicates a result that was not dispersed into the flowsheet. Document Creation Date: 10/09/2010 3:29 PM _______________________________________________________________________  (1) Order result status: Final Collection or observation date-time: 10/09/2010 15:20 Requested date-time:  Receipt date-time:  Reported date-time:  Referring Physician:   Ordering Physician: Fransico Setters [phone]) Specimen Source:  Source: Launa Grill Order Number: [id number] Lab site:

## 2010-11-06 NOTE — Progress Notes (Signed)
Summary: Cut on arm-FYI  Phone Note Call from Patient Call back at Home Phone [phone]   Caller: Patient--[phone] Call For: Tresa Garter MD Summary of Call: Pt cut herself on corner of kitchen cabinet, about 1/2 inch cut, top of left arm above wrist is where she cut it. Pt does not want office visit, pt just wants to know how to treat it, and when last tetanus shot was?Pt requests call asap. Initial call taken by: Verdell Face,  October 22, 2010 11:19 AM  Follow-up for Phone Call        I called pt back. She states the cut is minor and bleeding has stopped. I advised her to clean the cut with soap and warm water, use peroxide and place a pressure bandage on it.  I will pull her paper chart to verify when last tetanus was because there is no documentation in EMR. Follow-up by: Lanier Prude, [facility]),  October 22, 2010 11:37 AM  Additional Follow-up for Phone Call Additional follow up Details #1::        Agree Thank you!  Additional Follow-up by: Tresa Garter MD,  October 23, 2010 7:41 AM    Additional Follow-up for Phone Call Additional follow up Details #2::    paper chart requested Lanier Prude, [facility])  October 23, 2010 9:21 AM   Additional Follow-up for Phone Call Additional follow up Details #3:: Details for Additional Follow-up Action Taken: I reviewed pt's paper chart and do not see any documentation of tetanus vaccination. Called pt to see how her arm was and to inform of above and she stated [facility] and [facility]  and received her Td booster. Additional Follow-up by: Lanier Prude, [facility]),  October 30, 2010 8:29 AM    Immunization History:  Tetanus/Td Immunization History:    Tetanus/Td:  historical (10/22/2010)

## 2010-11-12 NOTE — Assessment & Plan Note (Signed)
Summary: cough / cj   Vital Signs:  Patient profile:   77 year old female Height:      65.5 inches Weight:      139.50 pounds BMI:     22.94 O2 Sat:      94 % on Room air Pulse rate:   94 / minute BP sitting:   126 / 68  (left arm) Cuff size:   regular  Vitals Entered By: Gweneth Dimitri RN (November 05, 2010 10:40 AM)  O2 Flow:  Room air CC: Acute Visit.  Pt c/o chest and head congestion with white mucus - onset end of December.  Pt also having increased SOB, wheezing, runny nose.   Comments Medications reviewed with patient Daytime contact number verified with patient. Gweneth Dimitri RN  November 05, 2010 10:40 AM    Copy to:  n/a Primary Provider/Referring Provider:  Sonda Primes, MD  CC:  Acute Visit.  Pt c/o chest and head congestion with white mucus - onset end of December.  Pt also having increased SOB, wheezing, and runny nose.  Marland Kitchen  History of Present Illness: July 14, 2010- COPD,  bronchits, tobacco/ quit cc:6 month follow up visit-COPD-pt has quit smoking!  She is noticing an improvement in her cough. Discussed visit to Neurology- suspected vertebrobasilar ischemia. She admits wheeze although she doesn't hear well. She doesn't seem to notice much dyspnea with ADLs and deneies discolored sputum, chest pain, palpitation. She is rarely using her rescue inhaler now.  September 15, 2010-  COPD,  bronchits, tobacco/ quit Nurse-CC: 2 month follow up visit-COPD; no complaints of any increased SOB. Less congested recently. She continues Symbicort and rartely needs her rescue inhaler.  She has not been coughing as much and no phlegm. She is using an electric cigarette a little and it has permitted her to get off cigarettes. She is very pleased with Dr Eden Emms and feels that her heart is doing well.   November 05, 2010- COPD,  bronchits, tobacco/ quit Nurse-CC: Acute Visit.  Echo- EF 60-65, mod aortic stenosis , diastolic dysfunction She is mostly over a slow viral type URI  with residual nasal congestion but clear chest. Had caught cold from grandchildren.     Preventive Screening-Counseling & Management  Alcohol-Tobacco     Smoking Status: quit < 6 months     Smoking Cessation Counseling: yes     Packs/Day: <0.25     Year Started: 1950     Year Quit: 05-2010     Pack years: 30yrs, 1/2 ppd     Tobacco Counseling: to remain off tobacco products  Current Medications (verified): 1)  Plavix 75 Mg  Tabs (Clopidogrel Bisulfate) .... Take 1 Tablet By Mouth Once A Day 2)  Potassium Chloride Cr 10 Meq  Cpcr (Potassium Chloride) .... 2 Once Daily 3)  Klonopin 0.5 Mg Tabs (Clonazepam) .... 2 Tablet By Mouth  At Bedtime 4)  Accu-Chek Compact  Strp (Glucose Blood) .... Once Daily-Bid 5)  Dilantin 100 Mg Caps (Phenytoin Sodium Extended) .... 2 By Mouth Qhs 6)  Lexapro 20 Mg Tabs (Escitalopram Oxalate) .... One Tablet By Mouth Once Daily At [facility] 7)  Glimepiride 1 Mg Tabs (Glimepiride) .Marland Kitchen.. 1 Tab By Mouth Once Daily 8)  Vitamin D 1000 Unit Tabs (Cholecalciferol) .Marland Kitchen.. 1 By Mouth Qd 9)  Ibuprofen 600 Mg Tabs (Ibuprofen) .Marland Kitchen.. 1 By Mouth As Needed 10)  Ferrous Sulfate 325 (65 Fe) Mg  Tabs (Ferrous Sulfate) .... One By Mouth Two Times  A Day Take Indefinitely 11)  Neurontin 300 Mg Caps (Gabapentin) .... Take 1 Capsule By Mouth Once Daily 12)  Symbicort 80-4.5 Mcg/act Aero (Budesonide-Formoterol Fumarate) .... 2 Puffs and Rinse Mouth, Twice Daily 13)  Nexium 40 Mg Cpdr (Esomeprazole Magnesium) .Marland Kitchen.. 1 Tab By Mouth 30 Minutes Before Meals  Allergies (verified): 1)  ! Penicillin 2)  Mevacor 3)  Metformin Hcl  Past History:  Past Medical History: Last updated: 09/15/2010 Aortic Stenosis: moderate by echo 6/11 gradient 20/36 mmHg for mean and peak TRANSIENT ISCHEMIC ATTACK Dr Pearlean Brownie PERIPHERAL VASCULAR DISEASE  ADENOCARCINOMA, COLON 2008S/P RIGHT HEMICOLECTOMY IRRITABLE BOWEL SYNDROME  Dr [name] DIABETES MELLITUS, TYPE II   Chronic BRONCHITIS   COPD ATHEROSCLEROSIS DEPRESSION  ANXIETY    Dr Donell Beers RENAL CYST, LEFT  SLEEP RELATED MOVEMENT DISORDER UNSPECIFIED  CIRRHOSIS Seizure disorder 2009 Dr Pearlean Brownie GYN Dr Tenny Craw ANEMIA, IRON DEFICIENCY  CELLULITIS, ANKLE, LEFT  BREAST PAIN  ABNORMAL FINDINGS GI TRACT PERSONAL HX COLONIC POLYPS  DIVERTICULOSIS-COLON  PERSONAL HX COLON CANCER BELCHING  INFECTIOUS DIARRHEA SINUSITIS, ACUTE ABNORMAL LIVER FUNCTION TESTS  THRUSH FATIGUE  GERD ATHEROSCLEROSIS  INSOMNIA, CHRONIC RENAL CYST, LEFT   Past Surgical History: Last updated: 09/17/2010 Carotid endarterectomy EUS (01/07/2006 , and 04/22/2006) EGD (11/19/05) Right hemi-Colectomy   2008 Right Breast-Lumpectomy 11/09 Cholecystectomy  Family History: Last updated: 09/17/2010 Family History Hypertension Family History of Colon Cancer: Paternal Grandmother Family History of Esophageal Cancer: brother Family History of Heart Disease: Mother Family History of Diabetes: Bother  Social History: Last updated: 09/17/2010 2-3 cigs/ day- rare, occasional- stopped...Marland Kitchennow on electronic cigarrettes Divorced with children Occupation: Retired Alcohol use-never Drug use-never Regular exercise-yes chair yoga 2010 Daily Caffeine Use  Risk Factors: Exercise: yes (09/11/2009)  Risk Factors: Smoking Status: quit < 6 months (11/05/2010) Packs/Day: <0.25 (11/05/2010)  Physical Exam  Additional Exam:  General: A/Ox3; pleasant and cooperative, NAD, gentle little lady SKIN: no rash, lesions NODES: no lymphadenopathy HEENT: [place]/AT, EOM- WNL, Conjuctivae- clear, PERRLA, TM-WNL, Nose- clear, Throat- clear and wnl, dentures, Mallampati  II NECK: Supple w/ fair ROM, JVD- none, normal carotid impulses w/o bruits Thyroid-  CHEST:Clear with no rhonchi or wheeze today.  HEART: RRR- regular to my exam,1- 2/6 systolic murmur high at aortic space ABDOMEN: nontender EXB:MWUX, nl pulses, no edema  NEURO: Grossly intact to  observation      Other Orders: Est. Patient Level III (32440)  Patient Instructions: 1)  Please schedule a follow-up appointment in 3 months. 2)  Try otc loratadine/ claritin as an antihistamine to dry up watery nose.    Orders Added: 1)  Est. Patient Level III [10272]

## 2010-11-12 NOTE — Progress Notes (Signed)
Summary: appt  Phone Note Call from Patient Call back at Home Phone [phone]   Caller: Patient Call For: young Summary of Call: Pt states she was given an abx in Dec for upper respiratory infection, says it's not working wants to be seen pls advise.//[facility] [place] Initial call taken by: Darletta Moll,  November 03, 2010 11:13 AM  Follow-up for Phone Call        Called, spoke with pt.  She c/o coughing, runny nose, and not feeling well.  Would like to be seen for follow up as appt on 11/14/10 had to be cancelled per CY's schedule change.  Appt scheduled with CY for 11/05/10 at 10:15 -- pt aware. Follow-up by: Gweneth Dimitri RN,  November 03, 2010 11:53 AM

## 2010-11-12 NOTE — Medication Information (Signed)
Summary: Prior autho & approved for Pantoprazole/  Prior autho & approved for Pantoprazole/   Imported By: Sherian Rein 11/03/2010 08:18:12  _____________________________________________________________________  External Attachment:    Type:   Image     Comment:   External Document

## 2010-11-14 ENCOUNTER — Ambulatory Visit: Payer: Self-pay | Admitting: Internal Medicine

## 2010-11-18 ENCOUNTER — Other Ambulatory Visit: Payer: Self-pay

## 2010-11-21 ENCOUNTER — Encounter: Payer: Self-pay | Admitting: Internal Medicine

## 2010-11-25 ENCOUNTER — Ambulatory Visit: Payer: Self-pay | Admitting: Internal Medicine

## 2010-11-27 ENCOUNTER — Other Ambulatory Visit: Payer: Self-pay | Admitting: Internal Medicine

## 2010-11-27 ENCOUNTER — Encounter (INDEPENDENT_AMBULATORY_CARE_PROVIDER_SITE_OTHER): Payer: Self-pay | Admitting: *Deleted

## 2010-11-27 ENCOUNTER — Other Ambulatory Visit: Payer: Medicare Other

## 2010-11-27 DIAGNOSIS — T887XXA Unspecified adverse effect of drug or medicament, initial encounter: Secondary | ICD-10-CM

## 2010-11-27 DIAGNOSIS — E119 Type 2 diabetes mellitus without complications: Secondary | ICD-10-CM

## 2010-11-27 LAB — BASIC METABOLIC PANEL
BUN: 7 mg/dL (ref 6–23)
CO2: 29 mEq/L (ref 19–32)
Calcium: 9.1 mg/dL (ref 8.4–10.5)
Creatinine, Ser: 0.8 mg/dL (ref 0.4–1.2)
Glucose, Bld: 121 mg/dL — ABNORMAL HIGH (ref 70–99)

## 2010-11-27 LAB — HEPATIC FUNCTION PANEL
Albumin: 3.6 g/dL (ref 3.5–5.2)
Total Protein: 7.2 g/dL (ref 6.0–8.3)

## 2010-11-27 LAB — HEMOGLOBIN A1C: Hgb A1c MFr Bld: 6.2 % (ref 4.6–6.5)

## 2010-12-01 ENCOUNTER — Encounter: Payer: Self-pay | Admitting: Internal Medicine

## 2010-12-01 ENCOUNTER — Ambulatory Visit (INDEPENDENT_AMBULATORY_CARE_PROVIDER_SITE_OTHER): Payer: Medicare Other | Admitting: Internal Medicine

## 2010-12-01 DIAGNOSIS — F329 Major depressive disorder, single episode, unspecified: Secondary | ICD-10-CM

## 2010-12-01 DIAGNOSIS — F411 Generalized anxiety disorder: Secondary | ICD-10-CM

## 2010-12-01 DIAGNOSIS — G4700 Insomnia, unspecified: Secondary | ICD-10-CM

## 2010-12-01 DIAGNOSIS — E119 Type 2 diabetes mellitus without complications: Secondary | ICD-10-CM

## 2010-12-15 ENCOUNTER — Encounter (HOSPITAL_BASED_OUTPATIENT_CLINIC_OR_DEPARTMENT_OTHER): Payer: Medicare Other | Admitting: Hematology & Oncology

## 2010-12-15 DIAGNOSIS — Z85038 Personal history of other malignant neoplasm of large intestine: Secondary | ICD-10-CM

## 2010-12-15 DIAGNOSIS — D509 Iron deficiency anemia, unspecified: Secondary | ICD-10-CM

## 2010-12-15 DIAGNOSIS — C189 Malignant neoplasm of colon, unspecified: Secondary | ICD-10-CM

## 2010-12-15 LAB — CBC WITH DIFFERENTIAL (CANCER CENTER ONLY)
Eosinophils Absolute: 0.1 10*3/uL (ref 0.0–0.5)
LYMPH#: 2.6 10*3/uL (ref 0.9–3.3)
MONO#: 1 10*3/uL — ABNORMAL HIGH (ref 0.1–0.9)
NEUT#: 6.1 10*3/uL (ref 1.5–6.5)
Platelets: 145 10*3/uL (ref 145–400)
RBC: 4.89 10*6/uL (ref 3.70–5.32)
WBC: 9.8 10*3/uL (ref 3.9–10.0)

## 2010-12-15 LAB — COMPREHENSIVE METABOLIC PANEL
AST: 44 U/L — ABNORMAL HIGH (ref 0–37)
Albumin: 4 g/dL (ref 3.5–5.2)
Alkaline Phosphatase: 82 U/L (ref 39–117)
Potassium: 3.9 mEq/L (ref 3.5–5.3)
Sodium: 137 mEq/L (ref 135–145)
Total Protein: 7.6 g/dL (ref 6.0–8.3)

## 2010-12-16 NOTE — Assessment & Plan Note (Signed)
Summary: 3 MO ROV  # /CD   Vital Signs:  Patient profile:   77 year old female Height:      65.5 inches (166.37 cm) Weight:      145.50 pounds (66.14 kg) BMI:     23.93 O2 Sat:      96 % on Room air Temp:     98.5 degrees F (36.94 degrees C) oral Pulse rate:   80 / minute Resp:     18 per minute BP sitting:   124 / 62  (left arm) Cuff size:   regular  Vitals Entered By: Burnard Leigh CMA(AAMA) (December 01, 2010 5:01 PM)  O2 Flow:  Room air Is Patient Diabetic? Yes Comments Pt states Rx needed for Bayer testing strips for machine. Pt states she would like new Rxs for Motrin & TAC-0.5% Cetaphil 1:10 cream   Primary Care Provider:  Sonda Primes, MD   History of Present Illness: The patient presents for a follow up of hypertension, diabetes, hyperlipidemia, cirrhosis  Current Medications (verified): 1)  Plavix 75 Mg  Tabs (Clopidogrel Bisulfate) .... Take 1 Tablet By Mouth Once A Day 2)  Potassium Chloride Cr 10 Meq  Cpcr (Potassium Chloride) .... 2 Once Daily 3)  Klonopin 0.5 Mg Tabs (Clonazepam) .... 2 Tablet By Mouth  At Bedtime 4)  Accu-Chek Compact  Strp (Glucose Blood) .... Once Daily-Bid 5)  Dilantin 100 Mg Caps (Phenytoin Sodium Extended) .... 2 By Mouth Qhs 6)  Lexapro 20 Mg Tabs (Escitalopram Oxalate) .... One Tablet By Mouth Once Daily At [facility] 7)  Glimepiride 1 Mg Tabs (Glimepiride) .Marland Kitchen.. 1 Tab By Mouth Once Daily 8)  Vitamin D 1000 Unit Tabs (Cholecalciferol) .Marland Kitchen.. 1 By Mouth Qd 9)  Ibuprofen 600 Mg Tabs (Ibuprofen) .Marland Kitchen.. 1 By Mouth As Needed 10)  Ferrous Sulfate 325 (65 Fe) Mg  Tabs (Ferrous Sulfate) .... One By Mouth Two Times A Day Take Indefinitely 11)  Neurontin 300 Mg Caps (Gabapentin) .... Take 1 Capsule By Mouth Once Daily 12)  Symbicort 80-4.5 Mcg/act Aero (Budesonide-Formoterol Fumarate) .... 2 Puffs and Rinse Mouth, Twice Daily 13)  Nexium 40 Mg Cpdr (Esomeprazole Magnesium) .Marland Kitchen.. 1 Tab By Mouth 30 Minutes Before Meals  Allergies (verified): 1)  !  Penicillin 2)  Mevacor 3)  Metformin Hcl  Past History:  Past Medical History: Last updated: 09/15/2010 Aortic Stenosis: moderate by echo 6/11 gradient 20/36 mmHg for mean and peak TRANSIENT ISCHEMIC ATTACK Dr Pearlean Brownie PERIPHERAL VASCULAR DISEASE  ADENOCARCINOMA, COLON 2008S/P RIGHT HEMICOLECTOMY IRRITABLE BOWEL SYNDROME  Dr [name] DIABETES MELLITUS, TYPE II   Chronic BRONCHITIS  COPD ATHEROSCLEROSIS DEPRESSION  ANXIETY    Dr Donell Beers RENAL CYST, LEFT  SLEEP RELATED MOVEMENT DISORDER UNSPECIFIED  CIRRHOSIS Seizure disorder 2009 Dr Pearlean Brownie GYN Dr Tenny Craw ANEMIA, IRON DEFICIENCY  CELLULITIS, ANKLE, LEFT  BREAST PAIN  ABNORMAL FINDINGS GI TRACT PERSONAL HX COLONIC POLYPS  DIVERTICULOSIS-COLON  PERSONAL HX COLON CANCER BELCHING  INFECTIOUS DIARRHEA SINUSITIS, ACUTE ABNORMAL LIVER FUNCTION TESTS  THRUSH FATIGUE  GERD ATHEROSCLEROSIS  INSOMNIA, CHRONIC RENAL CYST, LEFT   Social History: Last updated: 09/17/2010 2-3 cigs/ day- rare, occasional- stopped...Marland Kitchennow on electronic cigarrettes Divorced with children Occupation: Retired Alcohol use-never Drug use-never Regular exercise-yes chair yoga 2010 Daily Caffeine Use  Review of Systems  The patient denies fever, chest pain, and abdominal pain.    Physical Exam  General:  Well developed, well nourished, no acute distress. Nose:  No deformity, discharge,  or lesions. Mouth:  Oral mucosa and oropharynx without lesions  or exudates.  Teeth in good repair. Neck:  No deformities, masses, or tenderness noted. Lungs:  Clear throughout to auscultation. Heart:  Regular rate and rhythm; no murmurs, rubs,  or bruits. Abdomen:  Soft, nontender and nondistended. No masses, hepatosplenomegaly or hernias noted. Normal bowel sounds. Msk:  R wrist is tender w/ROM Neurologic:  Alert and  oriented x4;   Psych:  Alert and cooperative. not anxious appearing   Impression & Recommendations:  Problem # 1:  DIABETES MELLITUS, TYPE II  (ICD-250.00) Assessment Unchanged  Her updated medication list for this problem includes:    Glimepiride 1 Mg Tabs (Glimepiride) .Marland Kitchen... 1 tab by mouth once daily  Problem # 2:  ANEMIA-B12 DEFICIENCY (ICD-281.1) Assessment: Improved On the regimen of medicine(s) reflected in the chart    Problem # 3:  CIRRHOSIS (ICD-571.5) Assessment: Unchanged  Problem # 4:  COPD (ICD-496) Assessment: Unchanged  Her updated medication list for this problem includes:    Symbicort 80-4.5 Mcg/act Aero (Budesonide-formoterol fumarate) .Marland Kitchen... 2 puffs and rinse mouth, twice daily  Problem # 5:  DEPRESSION (ICD-311)/apathy Assessment: Deteriorated Add Wellbutrin in am at low dose. We discussed the increased risk of seisure with Wellbutrin/Lexapro.  Problem # 6:  ANXIETY (ICD-300.00) Assessment: Unchanged  Her updated medication list for this problem includes:    Klonopin 0.5 Mg Tabs (Clonazepam) .Marland Kitchen... 2 tablet by mouth  at bedtime    Lexapro 20 Mg Tabs (Escitalopram oxalate) ..... One tablet by mouth once daily at hs    Wellbutrin Sr 100 Mg Xr12h-tab (Bupropion hcl) .Marland Kitchen... 1 by mouth qam for depression  Complete Medication List: 1)  Plavix 75 Mg Tabs (Clopidogrel bisulfate) .... Take 1 tablet by mouth once a day 2)  Potassium Chloride Cr 10 Meq Cpcr (Potassium chloride) .... 2 once daily 3)  Klonopin 0.5 Mg Tabs (Clonazepam) .... 2 tablet by mouth  at bedtime 4)  Accu-chek Compact Strp (Glucose blood) .... Once daily-bid 5)  Dilantin 100 Mg Caps (Phenytoin sodium extended) .... 2 by mouth qhs 6)  Lexapro 20 Mg Tabs (Escitalopram oxalate) .... One tablet by mouth once daily at hs 7)  Glimepiride 1 Mg Tabs (Glimepiride) .Marland Kitchen.. 1 tab by mouth once daily 8)  Vitamin D 1000 Unit Tabs (Cholecalciferol) .Marland Kitchen.. 1 by mouth qd 9)  Ibuprofen 600 Mg Tabs (Ibuprofen) .Marland Kitchen.. 1 by mouth as needed 10)  Ferrous Sulfate 325 (65 Fe) Mg Tabs (Ferrous sulfate) .... One by mouth two times a day take indefinitely 11)  Neurontin  300 Mg Caps (Gabapentin) .... Take 1 capsule by mouth once daily 12)  Symbicort 80-4.5 Mcg/act Aero (Budesonide-formoterol fumarate) .... 2 puffs and rinse mouth, twice daily 13)  Nexium 40 Mg Cpdr (Esomeprazole magnesium) .Marland Kitchen.. 1 tab by mouth 30 minutes before meals 14)  Wellbutrin Sr 100 Mg Xr12h-tab (Bupropion hcl) .Marland Kitchen.. 1 by mouth qam for depression 15)  Bayer Contour Test Strp (Glucose blood) .... Use qd 16)  Triamc 0.5% Cream in Cetaphil Lot 1:10  .... Use two times a day prn  Patient Instructions: 1)  Please schedule a follow-up appointment in 3 months. 2)  BMP prior to visit, ICD-9: 3)  Vit B12 266.20 4)  Vit D 268.9 5)  Lipid Panel prior to visit, ICD-9: 6)  HbgA1C prior to visit, ICD-9: Prescriptions: TRIAMC 0.5% CREAM IN CETAPHIL LOT 1:10 use two times a day prn  #500 g x 3   Entered and Authorized by:   Tresa Garter MD   Signed by:  Tresa Garter MD on 12/01/2010   Method used:   Print then Give to Patient   RxID:   [id number] BAYER CONTOUR TEST  STRP (GLUCOSE BLOOD) use qd  #50 x 11   Entered and Authorized by:   Tresa Garter MD   Signed by:   Tresa Garter MD on 12/01/2010   Method used:   Electronically to        [facility]* (retail)       [address]       [place], [place]  [address]       Ph: [phone]       Fax: [phone]   RxID:   [id number] IBUPROFEN 600 MG TABS (IBUPROFEN) 1 by mouth as needed  #60 x 3   Entered and Authorized by:   Tresa Garter MD   Signed by:   Tresa Garter MD on 12/01/2010   Method used:   Electronically to        [facility]* (retail)       [address]       [place], [place]  [address]       Ph: [phone]       Fax: [phone]   RxID:   [id number] WELLBUTRIN SR 100 MG XR12H-TAB (BUPROPION HCL) 1 by mouth qam for depression  #30 x 6   Entered and Authorized by:   Tresa Garter MD   Signed by:    Tresa Garter MD on 12/01/2010   Method used:   Electronically to        [facility] (retail)       [address]       [place], [place]  [address]       Ph: [phone]       Fax: [phone]   RxID:   [id number]    Orders Added: 1)  Est. Patient Level IV [299[address]]

## 2010-12-21 LAB — CBC
HCT: 33.9 % — ABNORMAL LOW (ref 36.0–46.0)
Platelets: 216 10*3/uL (ref 150–400)
RBC: 4.23 MIL/uL (ref 3.87–5.11)
WBC: 8.5 10*3/uL (ref 4.0–10.5)

## 2010-12-21 LAB — DIFFERENTIAL
Eosinophils Relative: 4 % (ref 0–5)
Lymphocytes Relative: 29 % (ref 12–46)
Lymphs Abs: 2.5 10*3/uL (ref 0.7–4.0)
Neutrophils Relative %: 58 % (ref 43–77)

## 2010-12-21 LAB — BASIC METABOLIC PANEL
CO2: 28 mEq/L (ref 19–32)
Calcium: 9 mg/dL (ref 8.4–10.5)
Chloride: 105 mEq/L (ref 96–112)
Creatinine, Ser: 0.9 mg/dL (ref 0.4–1.2)
Glucose, Bld: 76 mg/dL (ref 70–99)

## 2010-12-24 LAB — GLUCOSE, CAPILLARY
Glucose-Capillary: 114 mg/dL — ABNORMAL HIGH (ref 70–99)
Glucose-Capillary: 131 mg/dL — ABNORMAL HIGH (ref 70–99)

## 2011-01-03 ENCOUNTER — Other Ambulatory Visit: Payer: Self-pay | Admitting: Internal Medicine

## 2011-01-07 LAB — GLUCOSE, CAPILLARY
Glucose-Capillary: 114 mg/dL — ABNORMAL HIGH (ref 70–99)
Glucose-Capillary: 147 mg/dL — ABNORMAL HIGH (ref 70–99)

## 2011-01-19 ENCOUNTER — Telehealth: Payer: Self-pay | Admitting: *Deleted

## 2011-01-19 LAB — URINE MICROSCOPIC-ADD ON

## 2011-01-19 LAB — DIFFERENTIAL
Basophils Relative: 1 % (ref 0–1)
Eosinophils Absolute: 0.2 10*3/uL (ref 0.0–0.7)
Eosinophils Relative: 2 % (ref 0–5)
Monocytes Relative: 8 % (ref 3–12)
Neutrophils Relative %: 79 % — ABNORMAL HIGH (ref 43–77)

## 2011-01-19 LAB — URINALYSIS, ROUTINE W REFLEX MICROSCOPIC
Bilirubin Urine: NEGATIVE
Ketones, ur: NEGATIVE mg/dL
Protein, ur: >300 mg/dL — AB
Urobilinogen, UA: 1 mg/dL (ref 0.0–1.0)

## 2011-01-19 LAB — COMPREHENSIVE METABOLIC PANEL
ALT: 28 U/L (ref 0–35)
Alkaline Phosphatase: 110 U/L (ref 39–117)
CO2: 19 mEq/L (ref 19–32)
Glucose, Bld: 108 mg/dL — ABNORMAL HIGH (ref 70–99)
Potassium: 3.3 mEq/L — ABNORMAL LOW (ref 3.5–5.1)
Sodium: 138 mEq/L (ref 135–145)
Total Protein: 8.2 g/dL (ref 6.0–8.3)

## 2011-01-19 LAB — CBC
Hemoglobin: 13.2 g/dL (ref 12.0–15.0)
RBC: 4.71 MIL/uL (ref 3.87–5.11)
RDW: 12.8 % (ref 11.5–15.5)

## 2011-01-19 NOTE — Telephone Encounter (Signed)
Has ?'s   1. Had accident in a store several mths ago. Still has wrist/hand pain. What treatment does MD advise? Hot/cold, otc meds etc? 2. Should patient be taking B-12 injections?

## 2011-01-19 NOTE — Telephone Encounter (Signed)
Not sure - it was a long time ago - needs OV. Gentle heat is ok

## 2011-01-20 NOTE — Telephone Encounter (Signed)
Pt aware, transferred to scheduler for ov

## 2011-01-28 ENCOUNTER — Other Ambulatory Visit: Payer: Self-pay | Admitting: Internal Medicine

## 2011-01-28 ENCOUNTER — Telehealth: Payer: Self-pay | Admitting: Internal Medicine

## 2011-01-28 DIAGNOSIS — K921 Melena: Secondary | ICD-10-CM

## 2011-01-28 NOTE — Telephone Encounter (Signed)
Spoke with pt and she is aware of Dr. Lamar Sprinkles recommendations. Pt states she will try to come by today for labs and to pick up the hemoccult cards. If not she will come in the morning prior to her appt tomorrow. Pt given an appt to see Amy Esterwood PA 01/29/11@10am . Pt verbalized understanding

## 2011-01-28 NOTE — Telephone Encounter (Signed)
Have her come in for CBC and stool Hemoccults. If she has multiple stools like this, she needs to go to the ER. Otherwise, I would have her seen extender this week for a formal evaluation

## 2011-01-28 NOTE — Telephone Encounter (Signed)
Pt states that last night her stool was very black and shiny. Pt states she is taking iron so her stools are usually very dark anyway but she thinks this was blood in her stool. Pt states that she bagged up her stool from last night and put it in the refrigerator and wants to know if she could bring it in for the lab to test. Instructed pt to throw that away, that the lab would need a specimen in a lab container. Pt wants to know what Dr. [name] would like for her to do. Dr. [name] please advise.

## 2011-01-29 ENCOUNTER — Other Ambulatory Visit (INDEPENDENT_AMBULATORY_CARE_PROVIDER_SITE_OTHER): Payer: Medicare Other

## 2011-01-29 ENCOUNTER — Encounter: Payer: Self-pay | Admitting: Physician Assistant

## 2011-01-29 ENCOUNTER — Ambulatory Visit (INDEPENDENT_AMBULATORY_CARE_PROVIDER_SITE_OTHER): Payer: Medicare Other | Admitting: Physician Assistant

## 2011-01-29 VITALS — BP 126/64 | HR 80 | Ht 64.0 in | Wt 143.0 lb

## 2011-01-29 DIAGNOSIS — K921 Melena: Secondary | ICD-10-CM

## 2011-01-29 DIAGNOSIS — Z85038 Personal history of other malignant neoplasm of large intestine: Secondary | ICD-10-CM

## 2011-01-29 DIAGNOSIS — K7460 Unspecified cirrhosis of liver: Secondary | ICD-10-CM

## 2011-01-29 DIAGNOSIS — Q2733 Arteriovenous malformation of digestive system vessel: Secondary | ICD-10-CM

## 2011-01-29 DIAGNOSIS — R197 Diarrhea, unspecified: Secondary | ICD-10-CM

## 2011-01-29 DIAGNOSIS — K5520 Angiodysplasia of colon without hemorrhage: Secondary | ICD-10-CM

## 2011-01-29 LAB — CBC WITH DIFFERENTIAL/PLATELET
Basophils Relative: 0.4 % (ref 0.0–3.0)
Eosinophils Relative: 2.2 % (ref 0.0–5.0)
HCT: 43.5 % (ref 36.0–46.0)
Hemoglobin: 15.3 g/dL — ABNORMAL HIGH (ref 12.0–15.0)
Lymphs Abs: 1.9 10*3/uL (ref 0.7–4.0)
MCV: 86.5 fl (ref 78.0–100.0)
Monocytes Absolute: 0.8 10*3/uL (ref 0.1–1.0)
Monocytes Relative: 9.2 % (ref 3.0–12.0)
Neutro Abs: 5.7 10*3/uL (ref 1.4–7.7)
RBC: 5.02 Mil/uL (ref 3.87–5.11)
WBC: 8.6 10*3/uL (ref 4.5–10.5)

## 2011-01-29 LAB — IRON AND TIBC
%SAT: 25 % (ref 20–55)
TIBC: 308 ug/dL (ref 250–470)
UIBC: 231 ug/dL

## 2011-01-29 LAB — FERRITIN: Ferritin: 92.1 ng/mL (ref 10.0–291.0)

## 2011-01-29 NOTE — Progress Notes (Signed)
Reviewed and agree with management. Chrisotpher Rivero Jr. MD FACG  

## 2011-01-29 NOTE — Patient Instructions (Signed)
Please go to the basement level to have your labs drawn.  

## 2011-01-29 NOTE — Progress Notes (Signed)
  Subjective:    Patient ID: Monique Fleming, female    DOB: 1928-12-10, 77 y.o.   MRN: [id number]  HPI Monique Fleming is a pleasant 77 year old white female known to Dr. [name]  who has multiple medical problems including history of a prior TIA for which she is on chronic Plavix. She has history of stage I colon cancer diagnosed in 2008 and is status post right hemicolectomy, also with history of adenomatous colon polyps with last colonoscopy April 2011. She also has cryptogenic cirrhosis and a chronic iron deficiency anemia felt secondary to colonic and duodenal AVMs. She last underwent upper endoscopy in January 2012 to screen for esophageal varices. She did not have any evidence of varices did have duodenitis and several small nonbleeding duodenal AVMs. She is on chronic iron twice daily and says that her stools today dark all the time. She also complains of chronic fatigue.  Patient called earlier this week with concerns for GI bleeding as she passed a tarry black shiny stool on the evening of 11/28/2010. She says her bowel movements looked normal yesterday and then again this morning had a black or shiny or stool. She denies any abdominal pain nausea vomiting, but says she generally just does not feel good. She also has chronic problems with fecal incontinence and leakage. She has not been on any aspirin or NSAIDs but does take Plavix daily.  Labs were drawn this morning and are pending at this time.    Review of Systems  Constitutional: Positive for fatigue.  HENT: Negative.   Eyes: Negative.   Respiratory: Negative.   Cardiovascular: Negative.   Gastrointestinal: Positive for blood in stool.  Genitourinary: Negative.   Musculoskeletal: Negative.   Skin: Negative.   Neurological: Positive for weakness.  Hematological: Bruises/bleeds easily.  Psychiatric/Behavioral: Negative.        Objective:   Physical Exam Well-developed elderly white female in no acute distress, pleasant, alert and  oriented x3 HEENT nontraumatic normocephalic EOMI PERRLA sclera anicteric  Neck; supple no JVD  Cardiovascular; regular rate and rhythm with S1 and S2 no murmur rub or gallop  Pulmonary; clear bilaterally  Abdomen; soft somewhat protuberant no definite fluid wave, liver is palpable 3 fingerbreadths below the right costal margin and slightly tender no palpable splenomegaly no other tenderness, bowel sounds active, lower midline incisional scar  Rectal exam ;decreased sphincter tone very dark brown stool  which is Hemoccult-negative, no external hemorrhoids or lesion felt  Skin; scattered ecchymoses no edema  Psych; mood and affect normal and appropriate.        Assessment & Plan:  #59 77 year old white female with cryptogenic cirrhosis, history of iron deficiency anemia, colonic and duodenal AVMs and chronic anti-coagulation secondary to history of TIA. Patient with complaints of melena x2 days.   Monique Fleming has no evidence of melena on exam, stool is currently Hemoccult negative.  Plan; await today's labs Check protime and iron studies today. She has been on long-term iron, and would like to stop taking iron if possible or receive iron in another form so that it would be easier for her to monitor her stools. She says she worries about bleeding all the time because her stools are dark.  #2 History of stage I colon cancer 2008 status post right hemicolectomy Up to date with colonoscopy last done in April 2011  #3 Cryptogenic cirrhosis, compensated.

## 2011-01-30 ENCOUNTER — Other Ambulatory Visit: Payer: Self-pay | Admitting: Internal Medicine

## 2011-01-30 NOTE — Telephone Encounter (Signed)
Called daughter Carisha Kantor @ [phone].  Informed her the iron levels are now normal. She is to decrease the iron supplement to once daily. The daughter said that is really good and this may help her with the constipation. The iron Does really constipate.

## 2011-02-02 ENCOUNTER — Encounter: Payer: Self-pay | Admitting: Internal Medicine

## 2011-02-03 ENCOUNTER — Ambulatory Visit (INDEPENDENT_AMBULATORY_CARE_PROVIDER_SITE_OTHER): Payer: Medicare Other | Admitting: Internal Medicine

## 2011-02-03 ENCOUNTER — Encounter: Payer: Self-pay | Admitting: Internal Medicine

## 2011-02-03 VITALS — BP 138/82 | HR 104 | Ht 65.5 in | Wt 144.8 lb

## 2011-02-03 DIAGNOSIS — F17200 Nicotine dependence, unspecified, uncomplicated: Secondary | ICD-10-CM

## 2011-02-03 DIAGNOSIS — J449 Chronic obstructive pulmonary disease, unspecified: Secondary | ICD-10-CM

## 2011-02-03 MED ORDER — BUDESONIDE-FORMOTEROL FUMARATE 80-4.5 MCG/ACT IN AERO: 2.0000 | INHALATION_SPRAY | Freq: Two times a day (BID) | RESPIRATORY_TRACT | Status: DC

## 2011-02-03 NOTE — Patient Instructions (Signed)
Script for Symbicort sent to drug store

## 2011-02-03 NOTE — Progress Notes (Signed)
  Subjective:    Patient ID: Monique Fleming, female    DOB: [birth year], 77 y.o.   MRN: [id number]  HPI 89 yoF former smoker with COPD and multiple medical problems.  Last here November 05, 2010. Since then she reports staying off cigarettes and having no significant respiratory events. Pollen bothered more in March with nasal congestion, postnasal drip, but not much wheeze or cough. She feels current meds are adequate.  Review of Systems See HPI Constitutional:   No weight loss, night sweats,  Fevers, chills, fatigue, lassitude. HEENT:   No headaches,  Difficulty swallowing,  Tooth/dental problems,  Sore throat,                No sneezing, itching, ear ache, nasal congestion, post nasal drip,   CV:  No chest pain,  Orthopnea, PND, swelling in lower extremities, anasarca, dizziness, palpitations  GI  No heartburn, indigestion, abdominal pain, nausea, vomiting, diarrhea, change in bowel habits, loss of appetite  Resp: No acute shortness of breath with routine  exertion or at rest.  No excess mucus, no productive cough,  No non-productive cough,  No coughing up of blood.  No change in color of mucus.  No wheezing.  Skin: no rash or lesions.  GU: no dysuria, change in color of urine, no urgency or frequency.  No flank pain.  MS:  No joint pain or swelling.  No decreased range of motion.  No back pain.  Psych:  No change in mood or affect. No depression or anxiety.  No memory loss.      Objective:   Physical Exam General- Alert, Oriented, Affect-appropriate, Distress- none acute  Skin- rash-none, lesions- none, excoriation- none  Lymphadenopathy- none  Head- atraumatic  Eyes- Gross vision intact, PERRLA, conjunctivae clear, secretions  Ears- Hard of  Hearing,  Nose- Clear, No- Septal dev, mucus, polyps, erosion, perforation   Throat- Mallampati II , mucosa clear , drainage- none, tonsils- atrophic  Neck- flexible , trachea midline, no stridor , thyroid nl, carotid no  bruit  Chest - symmetrical excursion , unlabored     Heart/CV- RRR , 1-2/6 systolic aortic murmur , no gallop  , no rub, nl s1 s2                     - JVD- none , edema- none, stasis changes- none, varices- none     Lung-  Bilateral mild, unlabored wheeze that she doesn't recognize.    cough- none , dullness-none, rub- none     Chest wall-   Abd- tender-no, distended-no, bowel sounds-present, HSM- no  Br/ Gen/ Rectal- Not done, not indicated  Extrem- cyanosis- none, clubbing, none, atrophy- none, strength- nl  Neuro- grossly intact to observation         Assessment & Plan:

## 2011-02-07 NOTE — Assessment & Plan Note (Signed)
Onging asthmatic bronchitis component. We discussed her ability to sense wheezing. She doesn't hear it. We discussed meds, shortness of breath and options.

## 2011-02-07 NOTE — Assessment & Plan Note (Signed)
So far, she has indicated no relapse. I encouraged continued abstinence.

## 2011-02-17 NOTE — Assessment & Plan Note (Signed)
[facility]                         GASTROENTEROLOGY OFFICE NOTE   Monique Fleming, Monique Fleming                       MRN:          [id number]  DATE:06/17/2007                            DOB:          Oct 09, 1928    HISTORY:  This is a 77 year old white female with a history of type II  diabetes, prior TIA, anxiety with depression, and ascending colon  cancer, for which she underwent resection this past May.  The lesion was  found to be stage I (T2, N0, M0).  She presented today with a myriad of  complaints.  Her chief complaint is that of chronic loose stools which  she has had her entire life.  She also complains of nonspecific fatigue  and questions regarding recent elevation of liver function tests.  The  patient presented with iron-deficiency anemia.  Since her resection, her  anemia has resolved.  She has had recent mild elevation of hepatic  transaminases, which have been followed by Dr. Posey Rea.  Hepatitis  serologies were negative.  Her cholesterol medication was discontinued.  She states that her bowel habits are loose, somewhat yellow.  They  really are unchanged since surgery.  She thinks that having been on  metronidazole for a period of time was helpful, although she could not  tolerate the taste.  She has also been on Imodium, which she has had  transient success.  She thinks the symptoms are worse when she is  anxious or nervous, which she has been recently.  She does not have  nocturnal symptoms.  There is no nausea, vomiting, abdominal pain, or  fevers.   Current medications include Plavix, glimepiride, clonazepam,  promethazine, potassium chloride, vitamin D, Tristiq, multivitamin,  Welchol, and Prilosec.   PHYSICAL EXAMINATION:  A pleasant, elderly female in no acute distress.  She is alert and oriented.  Blood pressure is 128/72, heart rate 98.  Weight is 138 pounds.  HEENT:  Sclerae are anicteric.  Conjunctivae are pink.  Oral mucosa  is  intact.  No adenopathy.  LUNGS:  Clear.  HEART:  Regular.  ABDOMEN:  Soft without tenderness, mass, or hernia.  Surgical incision  is well healed.  EXTREMITIES:  Without edema.   IMPRESSION:  1. Chronic problems with loose stool, exacerbated by stress and      anxiety.  Most likely diarrhea, predominant irritable bowel      syndrome.  2. Ascending colon cancer, status post resection.  3. Fatigue and weakness, nonspecific.  4. Mild elevation of hepatic transaminases, likely due to medication.      Rule out mild myositis.   RECOMMENDATIONS:  1. Check CPK and Aldolase.  2. Lomotil 1-3 times daily as needed for diarrhea.  3. Recommended Questran b.i.d.  Patient has decided she does not want      to try this due to the grittiness that I described.  4. Surveillance colonoscopy next May.  5. Office followup in about four weeks.  She should have a follow-up      liver tests at that time.     Wilhemina Bonito. [name], MD  Electronically Signed  JNP/MedQ  DD: 06/17/2007  DT: 06/18/2007  Job #: [id number]   cc:   Ardeth Sportsman, MD  Rose Phi. Myna Hidalgo, M.D.  Georgina Quint. Plotnikov, MD

## 2011-02-17 NOTE — Assessment & Plan Note (Signed)
[facility]                             PULMONARY OFFICE NOTE   ASUSENA, SIGLEY                       MRN:          [id number]  DATE:06/07/2007                            DOB:          September 25, 1929    PROBLEM LIST:  1. Chronic insomnia.  2. Nonrestorative sleep.  3. Sleep movement disorder.  4. Atherosclerosis, carotid endarterectomy.  5. Tobacco abuse.  6. Bronchitis.  7. Colon cancer.   HISTORY:  Not concerned much with her sleeping currently.  I think she  has settled into a rhythm.  She continues to smoke about 1/2 pack a day  against advice.  Had colon surgery with partial bowel resection.  No  colostomy.  Had increased liver functions on statin drug.  Describes her  albuterol inhaler as clogged, and we discussed how to clear the jet.  Blames fall ragweed for nasal congestion and postnasal drip.  Cough  comes and goes, but she has not felt wheezy lately.  Feels pretty good  in the chest.   MEDICATIONS:  1. Plavix 75 mg.  2. Glimepiride 1 mg.  3. Clonazepam 0.5 mg up to t.i.d. p.r.n.  4. Promethazine p.r.n.  5. Prestiq 50 mg.  6. Viactiv.  7. Welchol.  8. Prilosec.  9. Ventolin HFA.   DRUG INTOLERANCES:  PENICILLIN in childhood.   OBJECTIVE:  VITAL SIGNS:  Weight 143 pounds, BP 146/72, pulse 100, room  air saturation 97%.  GENERAL:  Smiling and laughing.  No evidence of stress at all.  HEENT:  Nasal mucosa clear.  Pharynx is not red.  LYMPHATIC:  I do not find adenopathy.  LUNGS:  The lung fields sound really pretty clear.  HEART SOUNDS:  Normal.  EXTREMITIES:  No edema.   IMPRESSION:  1. Asthma/chronic obstructive pulmonary disease, previously defined.  2. Seasonal allergic rhinitis, with recent exacerbation.  3. Tobacco use.  4. Atherosclerosis, with coronary disease.   PLAN:  1. Smoking cessation was again heavily emphasized.  2. Refill Proventil HFA 2 puffs q.i.d. p.r.n.  3. Walk for endurance.  4. Schedule return  in 4 months, earlier p.r.n.     Clinton D. Maple Hudson, MD, Ton[facility] Bollman, FACP  Electronically Signed    CDY/MedQ  DD: 06/07/2007  DT: 06/08/2007  Job #: [id number]   cc:   Archer Asa, M.D.  Rose Phi. Myna Hidalgo, M.D.  Georgina Quint. Plotnikov, MD

## 2011-02-17 NOTE — Assessment & Plan Note (Signed)
[facility]                         GASTROENTEROLOGY OFFICE NOTE   Monique Fleming, Monique Fleming                       MRN:          [id number]  DATE:07/18/2007                            DOB:          [birth year]    HISTORY:  The patient presents today for followup. She is a 77 year old  with a history of type 2 diabetes, prior TIA, anxiety with depression,  and ascending colon cancer for which she underwent resection this past  May. She was evaluated in the office June 17, 2007 for evaluation  of chronic loose stools and nonspecific fatigue. She has also had some  mild elevation of liver tests being worked up by Dr. Posey Rea. I  obtained a CPK which was normal. Aldolase was mildly elevated. She was  prescribed Lomotil which she has been taking 1 at night and 2 in the  morning. She presents today for followup as requested. Since her last  visit, she reports feeling much better. In particular, her bowels are  under reasonable control. Still with some urgency. Better form.  Occasional incontinence. Also improvement in terms of energy.   CURRENT MEDICATIONS:  Plavix, glimepiride, clonazepam, promethazine,  potassium chloride, vitamin D, Viactiv, Welchol, and Prilosec.   PHYSICAL EXAMINATION:  GENERAL:  Well-appearing female in no acute  distress.  VITAL SIGNS:  Blood pressure 122/72, heart rate 88, weight is 137  pounds.  ABDOMEN:  Soft without tenderness, mass or hernia. Good bowel sounds  heard.   IMPRESSION:  1. Chronic problems with loose stools exacerbated by stress and      anxiety. Most likely irritable bowel. Symptoms improved      significantly with Lomotil.  2. Ascending colon cancer status post resection May 2008.  3. Fatigue and weakness. Nonspecific. Improved. I wonder if she may      not have had some mild myositis related to her cholesterol      medication. This may also explain the mild elevation of aldolase as      well as her  transaminases with otherwise negative workup.   RECOMMENDATIONS:  1. Continue Lomotil p.r.n.  2. Keep planned surveillance colonoscopy appointment May 1999.  3. Ongoing general medical care with Dr. Posey Rea.     Wilhemina Bonito. [name], MD  Electronically Signed    JNP/MedQ  DD: 07/18/2007  DT: 07/19/2007  Job #: [id number]   cc:   Monique Quint. Plotnikov, MD

## 2011-02-20 NOTE — Assessment & Plan Note (Signed)
[facility]                         GASTROENTEROLOGY OFFICE NOTE   Monique Fleming, Monique Fleming                         MRN:          [id number]  DATE:12/15/2006                            DOB:          [birth year]    REASON FOR CONSULTATION:  Iron deficiency anemia.   HISTORY OF PRESENT ILLNESS:  This is a 77 year old, white female with a  history of diabetes mellitus, gastroesophageal reflux disease, irritable  bowel syndrome, colon polyps, osteoporosis, osteoarthritis,  hyperlipidemia and carotid artery disease who is referred through the  courtesy of Dr. Posey Rea regarding iron deficiency anemia.  The patient  last underwent colonoscopy in [place], [place], in April 2002.  She  was found to have several small polyps and followup was recommended  between 4-8 years.  She has not had colonoscopy in [place].  She has,  however, had upper endoscopy on several occasions.  Her initial upper  endoscopy was in May 2004.  This was normal.  At that time, she was  having abdominal pain felt secondary to gallstones.  She is since status  post cholecystectomy.  More recently, she was evaluated in the office in  February 2007, for dark stools, questioning melena.  At this time, she  was on Plavix therapy.  Upper endoscopy on November 20, 2003, revealed  no bleeding or worrisome lesions.  She was noted to have a submucosal  esophageal nodule which has been followed by Dr. Rob Bunting via  endoscopic ultrasound.  At this point, her esophagus is being followed  clinically.  Recently, she has had problems with progressive fatigue and  tiredness.  CBC obtained by Dr. Posey Rea on November 24, 2006, revealed  a hemoglobin of 9.5.  Her MCV was low at 67.  Iron studies confirmed  iron deficiency with an iron level of 20, ferritin level of 16 and  saturation of 6%.  The patient denies any significant GI symptoms.  She  does note dark stool, although she attributes this to  iron  supplementation.  She has had no problems with abdominal pain, change in  bowel habits or significant weight loss.  She is on Plavix for prior  history of a carotid endarterectomy.  She has come off of this though  for procedures.   ALLERGIES:  PENICILLIN.   CURRENT MEDICATIONS:  1. Citracal 250 mg daily.  2. Lovastatin 40 mg daily.  3. Lunesta 2-4 mg at night.  4. Effexor XR 225 mg daily.  5. Plavix 75 mg daily.  6. Glimepiride 1 mg daily.  7. Clonazepam 0.5 mg one to three times daily as needed.  8. Promethazine p.r.n.  9. Protonix 40 mg daily.  10.Potassium chloride 8 mEq daily.   PAST MEDICAL HISTORY:  As outlined above.   FAMILY HISTORY:  Negative for gastrointestinal malignancy.   SOCIAL HISTORY:  Noncontributory.   PHYSICAL EXAMINATION:  GENERAL:  Well-appearing female in no acute  distress, somewhat pale.  VITAL SIGNS:  Blood pressure 134/64, heart rate initially 112 and  regular.  Repeat heart rate with myself 92 and regular.  Weight 142.8  pounds.  HEENT:  Sclerae are anicteric.  Conjunctivae pale.  Oral mucosa intact.  There are no ulcerations or telangiectasias on the buccal mucosa.  LUNGS:  Clear.  HEART:  Regular.  ABDOMEN:  Soft and nontender with good bowel sounds.  No organomegaly,  mass or hernia.  RECTAL:  Deferred.  EXTREMITIES:  Without edema.   IMPRESSION:  This is a 77 year old female with multiple medical problems  who presents today regarding symptomatic iron deficiency anemia.  Prior  upper endoscopies revealing no significant lesions.  Rule out occult  colon lesion.  Colonoscopy in [place] about 6 years ago.  She does have  a history of polyps.   RECOMMENDATIONS:  Colonoscopy and upper endoscopy with duodenal  biopsies.  The nature of the procedures including risks, benefits and  alternatives were discussed in great detail.  We also discussed the pros  and cons of discontinuing Plavix therapy.  The patient wishes to have  Plavix held  prior to her procedures.  I would recommend holding this x5  days preprocedure.  We will check with Dr. Posey Rea to see if this is  acceptable.  If her endoscopic exams are unrevealing and biopsies  negative for sprue, then she may benefit from a capsule endoscopy.     Wilhemina Bonito. [name], MD  Electronically Signed    JNP/MedQ  DD: 12/16/2006  DT: 12/17/2006  Job #: [id number]   cc:   Georgina Quint. Plotnikov, MD

## 2011-02-20 NOTE — Assessment & Plan Note (Signed)
[facility]                               PULMONARY OFFICE NOTE   MCKENZEE, BEEM                       MRN:          [id number]  DATE:05/11/2006                            DOB:          [birth year]    PROBLEM LIST:  1.  Insomnia, chronic.  2.  Non-restorative sleep.  3.  Sleep movement disorder.  4.  History of atherosclerotic vascular disease and carotid endarterectomy.  5.  Tobacco use.   HISTORY:  She was originally referred by Dr. Donell Beers for help with her  chronic insomnia complaint.  She is followed by Dr. Posey Rea who has  recently started thyroid supplement.  She goes back for neurologic followup  with Dr. Pearlean Brownie, and she asks appropriately whether her cerebrovascular  disease may contribute to her difficulty sleeping.  I told her it probably  did to some degree.  Our goal is to gradually reduce medications aimed at  her sleep.  At last visit, we had her stop Effexor and reduce Restoril and  Lunesta to 1 each at bedtime, while adding Doxepin 10 mg and Rozerem 8 mg.  She says with this combination she had been sleeping quite a lot better.  Only in the last 2 nights, she thinks she has been sleeping an average of  about an hour a night, and she has noted a little jittery in the legs.  She  will have been on her thyroid hormone  now about 3 weeks.  She continues  clonazepam 0.5 mg t.i.d.  She observes that she is irresistibly sleep around  10:30 a.m. and sleeps for about 2 hours.  We talked about sleep hygiene, the  daytime sedative effect of daytime clonazepam, and ways to modify her  pattern.  She has not been using caffeine at all.  I suggested that it might  help to take a cup of coffee with caffeine in the morning hoping that would  prevent the 10:30 nap and help restore more of a normal circadian rhythm.  She notices increased appetite and we talked further about whether that  might be from the doxepin.  Given the uncertainty of  the impact of these  medications and her thyroid adjustment, we decided that we would go another  month on her present regimen.  When she returns, we are going to look at  what we can take away.  I will also be interested in the effect of morning  caffeine.   OBJECTIVE:  VITAL SIGNS:  Weight 147 pounds, BP 132/66, pulse regular 94,  room air saturation 97%.  GENERAL:  A well-developed, well-nourished, alert, and cheerful.  No tremor.  HEART:  Sounds are regular.  There is a trace systolic murmur at the cardiac  base.  LUNGS:  Lung fields are clear and quiet.  EXTREMITIES:  Palms are dry.  I see no tremor.   IMPRESSION:  1.  Complaint of chronic insomnia.  2.  Organic brain syndrome.  3.  Complicating medical issues including treated hypothyroidism and poly      pharmacy, as well as tobacco use.  PLAN:  1.  Smoking cessation was again discussed.  2.  She will work with Dr. Posey Rea on her thyroid and with Dr. Pearlean Brownie on      her cerebrovascular issues.  3.  Try 1 cup of coffee in the morning to avoid the 10:30 nap.  4.  Scheduled to return in 1 month, consider stopping doxepin at that time.                                   Clinton D. Maple Hudson, MD, [facility], FACP   CDY/MedQ  DD:  05/11/2006  DT:  05/11/2006  Job #:  [id number]   cc:   Archer Asa, MD  Pramod P. Pearlean Brownie, MD

## 2011-02-20 NOTE — Discharge Summary (Signed)
NAME:  Monique Fleming, Monique Fleming NO.:  [id number]   MEDICAL RECORD NO.:  [id number]          PATIENT TYPE:  INP   LOCATION:  3709                         FACILITY:  [facility]   PHYSICIAN:  Rollene Rotunda, MD, FACCDATE OF BIRTH:  1928-10-23   DATE OF ADMISSION:  10/19/2006  DATE OF DISCHARGE:  10/20/2006                               DISCHARGE SUMMARY   PHYSICIANS:  Cardiologist is Dr. Gala Romney.  Primary care physician is  Dr. Posey Rea.   DISCHARGE DIAGNOSES:  1. Atypical chest pain, negative cardiac enzymes times two, status      post 2D echocardiogram this admission showing an ejection fraction      of 65 to 70%.  Study inadequate for evaluation of left ventricular      regional wall motion.  Mildly reduced aortic valve leaflet      excursion, consistent with mild aortic valve stenosis.  Mean trends      or aortic valve gradient was 10 mmHg, estimated aortic valve area      by VTI was 2.2, estimated aortic valve by Vmax was 2.04.  2. Chronic obstructive pulmonary disease.  3. Hypokalemia.   PAST MEDICAL HISTORY:  Past medical history includes:  1. Chronic insomnia.  2. Tobacco abuse.  3. COPD.  4. Patient report of a Myoview in 2004 that showed EF of 73 and no      ischemia.  5. History of cerebrovascular disease with previous TIAs.  6. Status post left carotid endarterectomy.  7. Anxiety/depression.  8. Hyperlipidemia.  9. History of renal cyst.  10.Diabetes.   ALLERGIES:  ALLERGY TO LIPITOR AND PENICILLIN.   HOSPITAL COURSE:  Monique Fleming is a 76 year old Caucasian female with  medical problems as stated above who presented to [facility] complaining  of substernal chest discomfort radiating into her back and right neck.  A patient with somewhat atypical chest pain with hypokalemia.  Initial  potassium 2.9.  Point of care markers were negative.  Patient was  admitted for observation, ruled out for a myocardial infarction.  Patient has never had a cardiac  catheterization.  Dr. Gala Romney felt the  patient needed further evaluation of coronaries secondary to her  vascular disease.  Patient declined recommendation for cardiac  catheterization.  Also refusing repeat stress test at this time.  She is  being discharged home to follow-up with Dr. Posey Rea, her primary care  physician.  Follow-up with Dr. Gala Romney p.r.n. if she decides to  proceed with any further cardiac workup.  Lab work at time of discharge:  Potassium 4.6, BUN 5 and creatinine 0.66.  Cardiac markers negative.  CBC with H&H 9.8 and 30.4.  At the time of discharge, the patient has  been given the discharge instructions.  She is to seek medical  assistance for further chest discomfort or shortness of breath.  Follow-  up with Dr. Posey Rea.  The patient is to call for an appointment.  I  have instructed her to resume her home medications.  Patient is not very  clear or precise about her medications.  She is somewhat vague in  confirmation.  Apparently she has several prescriptions for sleep  medications including doxepin, Lunesta, Rozerem and Restoril.  She  states she does not take all of these, but she takes whichever one she  needs.  She also is on Protonix, Klonopin, lovastatin 40 mg daily; Synthroid,  the patient states the Synthroid is 25 mcg daily; Aggrenox, Mucinex,  Plavix 75 mg daily, inhalers, neb treatments as previously ordered.   Duration of discharge encounter is 30 minutes.      Dorian Pod, ACNP      Rollene Rotunda, MD, [facility]  Electronically Signed    MB/MEDQ  D:  10/20/2006  T:  10/21/2006  Job:  [id number]   cc:   Georgina Quint. Plotnikov, MD

## 2011-02-20 NOTE — Op Note (Signed)
NAME:  Monique Fleming, Monique Fleming NO.:  [id number]   MEDICAL RECORD NO.:  [id number]          PATIENT TYPE:  INP   LOCATION:  0009                         [facility]AC[facility]   PHYSICIAN:  Ardeth Sportsman, MD     DATE OF BIRTH:  Mar 02, 1929   DATE OF PROCEDURE:  02/10/2007  DATE OF DISCHARGE:                               OPERATIVE REPORT   SURGEON:  Ardeth Sportsman, M.D.   ASSISTANT:  Ollen Gross. Vernell Morgans, M.D.   PREOPERATIVE DIAGNOSIS:  Right colon cancer.   POSTOPERATIVE DIAGNOSIS:  Proximal ascending colon cancer.   PROCEDURE PERFORMED:  1. Laparoscopic lysis of adhesions x75 minutes.  2. Laparoscopically assisted right hemicolectomy with anastomosis.   SPECIMENS:  Right colon including appendix and terminal ileum.   DRAINS:  None.   ESTIMATED BLOOD LOSS:  300 mL.   COMPLICATIONS:  No major complications.   ANESTHESIA:  1. General anesthesia.  2. Local anesthetic and a field block around all port sites as well as      the midline extraction site.   INDICATIONS FOR PROCEDURE:  Monique Fleming is a 77 year old female with a  history of anemia who was found on colonoscopy to have a polyp of the  cecum that was completely excised but had a larger mass in her ascending  colon with biopsy consistent with adenocarcinoma.  The anatomy and  physiology of the digestive tract was explained.  Pathophysiology of an  adenomatous polyp formation and colon cancer were discussed.   Technique of laparoscopic right hemicolectomy was explained.  The risks  of natural history of bleeding, obstruction, perforation, and other  risks were discussed.  The options were discussed and a recommendation  was made for laparoscopically assisted right hemicolectomy.  The risks  of stroke, heart attack, DVT, pulmonary embolism, and death were  discussed.  The risks such as bleeding, need for transfusion, wound  infection, abscess, injury to other organs, were discussed.  The risks  of internal or  incisional herniation, postoperative pain, and  anastomotic leak requiring emergency surgery and/or ostomy were  discussed.  The options were discussed, questions were answered, and she  and her family agreed to proceed.   OPERATIVE FINDINGS:  She had some moderate omental adhesions from her  prior cholecystectomy surgery as well as in the right lower quadrant.  She had a punched out 3 cm lesion, that seemed like the size of a  mushroom cap, in her proximal ascending colon.  There was no palpable  major lymphadenopathy.  She had a shortened terminal ileal mesentery.   DESCRIPTION OF PROCEDURE:  Informed consent was confirmed.  The patient  had preoperative bowel prep.  She had sequential compressive devices  active during the entire case.  She had her Plavix and other anti-  coagulants held preoperatively.  She was positioned supine with both  arms tucked.  She underwent general anesthesia without difficulty.  She  received preoperative IV Cefoxitin which she tolerated well.  She had a  Foley catheter placed.  She had her legs split in a split leg table  fashion  with foot rests.   Entry was gained in the abdomen through optical entry through her a left  upper quadrant stab incision using a 5 mm 0 degree scope with the  patient in steep reversed Trendelenburg and left side up.  Capnoperitoneum to 15 mmHg provided good abdominal insufflation with  evidence of no intra-abdominal injury.  Under direct visualization, 5 mm  ports were placed in the right lower quadrant and right flank and a 10  mm port was placed just infraumbilically.   Adhesions down in the pelvis were freed to help reflect the omentum and  the small bowel up into the left upper quadrant with the patient in  steep Trendelenburg and right side up.  The cecum could be identified  and was elevated and had a fair amount of adhesions to the lateral side  wall.  These were partially freed off but some lateral adhesions were   initially maintained to facillitate medial to lateral dissection.  The  cecum was elevated anterior and the ileocolic branch could be  identified.  A window was made in the mesentery and the right colonic  mesentery was elevated anteriorly and freed from its retromesenteric  attachments in the plane of Toldt's fascia.  This was done up the right  pericolic gutter and hepatic flexure and over to the midline.  There  were some dense adhesions posteriorly to the duodenum (probablr from her  prior cholecystectomy) and these were freed off sharply taking care to  avoid any injury sources.  This was carried over medially.  In doing  this, she had a very thick ileocolic mesenter.  This completed the  medial to lateral and retromesenteric dissection.   Therefore, the colon was freed in a lateral to medial fashion using  sharp dissection to help free the right colon from its lateral side wall  attachments as well as attachments to the hepatic flexure and  attachments of the mesentery to the retroperitoneum just inferior to the  liver using primarily sharp cautery but also controlled LigaSure.  The  right half of the greater omentum was freed off the colon, as well,  lifting the omentum cephalad and anteriorly while reflecting the  transverse colon inferiorly to get into the window.  This helped to  provide excellent mobilization to have the entire  right colon crossing  over the midline and the proximal transverse colon could easily reach  down to the umbilicus.  There had been some blood loss during dissection  with lysis of adhesions in the right upper quadrant, but at the end, it  was quiet.   The capnoperitoneum was evacuated completely through the ports.  The  infraumbilical incision was extended another 3 cm inferiorly and the  fascia was breached.  A wound protector was placed in and I was able to exteriorize the right colon to good effect.  Inspection was made.  The  ileocolic branch was  followed more proximally and ligated using LigaSure  and a 0 silk tie to doubly ligate it to good effect.  The mesentery was  taken in a ray like fashion towards the terminal ileum to help preserve  some good tissue.  The mesentery was taken in a ray like fashion on the  transverse colon, as well, taking care to find good, healthy tissue.  The proximal terminal ileum was transected using a stapler using a 75  load GIA.  The stapler was used to take the proximal transverse colon  just slightly proximal to  the midline with a good result.  The specimen  was sent off to pathology.  Hemostasis was insured.   An ileocolonic anastomosis was done using an endo-GIA stapler.  The  small enterotomies were done on the endomesenteric side about 2 cm away  from the distal staple edges in a side-to-side fashion.  The common  stapler defect was closed using 3-0 Vicryl interrupted stitches with a  good result.  Inspection was made and there was a little bit of mild  ischemia on the colon side, but certainly no necrosis or severe  ischemia.  The specimen was returned back to the abdomen.  The wound  protector was clamped off and capnoperitoneum was reinstituted.   Copious irrigation with about 4 liters total was done.  Old blood was  evacuated out but there was no evidence of any active bleeding.  The  mesenteric defect was rather large and was closed laparoscopically using  a 2-0 silk in a running fashion to good effect.  The small bowel was run  from the ileocolic anastomosis to the ligament of Treitz and there was  no twisting or torsion of the small bowel.  The anastomosis was now nice  and pink with no ischemia or ecchymosis & laid well up in the right  upper quadrant just right side of lateral to the falciform ligament.  There was no twisting or turning in the mesentery.  Irrigation had a  nice clear return at the end with no active bleeding.  Capnoperitoneum  was again evacuated through the wound  protector.  The ports were removed  and the wound protector was removed.   The fascia was closed in the midline using #1 PDS with a good result.  Copious irrigation was done of the midline.  The skin wounds were closed  using 4-0 Monocryl stitch.  Sterile dressings were applied.  The patient  was extubated and taken to the recovery room in stable condition.   I explained the operative findings to the patient's family.  Postoperative goals for recovery were discussed in detail.  Questions  were answered.  They expressed understanding and appreciation.      Ardeth Sportsman, MD  Electronically Signed     SCG/MEDQ  D:  02/10/2007  T:  02/10/2007  Job:  [id number]   cc:   Georgina Quint. Plotnikov, MD  [address]  [place]  [place] [address]   Wilhemina Bonito. [name], MD  [address]  [place]  [place] [address]   Rose Phi. Myna Hidalgo, M.D.  Fax: [phone]

## 2011-02-20 NOTE — Assessment & Plan Note (Signed)
[facility]                                   ON-CALL NOTE   Monique Fleming, Monique Fleming                       MRN:          [id number]  DATE:06/27/2006                            DOB:          January 21, 1929    Attempted call on Monique Fleming.   PHONE NUMBER:  [phone]   The pager system said that she was having a problem walking and could not  walk.  I called the cell phone and left a message.  They could call back or  I would try calling back, and there was no answer.                                   Neta Mends. Fabian Sharp, MD   WKP/MedQ  DD:  06/27/2006  DT:  06/29/2006  Job #:  [id number]

## 2011-02-20 NOTE — H&P (Signed)
NAME:  Monique Fleming, Monique Fleming NO.:  [id number]   MEDICAL RECORD NO.:  [id number]          PATIENT TYPE:  INP   LOCATION:  NA                           FACILITY:  [facility]   PHYSICIAN:  Balinda Quails, M.D.    DATE OF BIRTH:  [birth year]   DATE OF ADMISSION:  06/18/2005  DATE OF DISCHARGE:                                HISTORY & PHYSICAL   PRIMARY CARE PHYSICIAN:  Georgina Quint. Plotnikov, M.D.   CARDIOLOGIST:  Salvadore Farber, M.D.   NEUROLOGIST:  Pramod P. Pearlean Brownie, M.D.   CHIEF COMPLAINT:  Left internal carotid artery stenosis.   HISTORY OF PRESENT ILLNESS:  This is a 77 year old Caucasian female status  post a brief episode of difficulty with speech in July 2005, which lasted  approximately two hours.  She had a sudden onset of expressive aphasia, and  presented to the emergency room;  however, by the time she was evaluated,  the disturbance had resolved.  She has had no further episodes.  CT scan was  normal, and a carotid Doppler revealed a 60 to 79% bilateral internal  carotid artery stenosis with an irregular plaque.  The patient was placed on  Plavix and aspirin.  She was scheduled for surgery in January;  however, due  to personal reasons she had to reschedule the surgery.  Repeat carotid  Doppler's were performed in August 2006, which confirmed the left internal  carotid artery stenosis.  It is our opinion the patient should proceed with  left carotid endarterectomy.  The patient denies headache, nausea, vomiting,  dizziness, vertigo, seizures, numbness, tingling, muscle weakness,  dysarthria, dysphagia, visual changes, syncope, memory loss, and confusion.  She does complain of dyspnea on exertion.   PAST MEDICAL HISTORY:  1.  Extracranial cerebrovascular occlusive disease.  2.  Diabetes mellitus.  3.  Anxiety.  4.  Anemia.  5.  Hyperlipidemia.  6.  Left hemispheric TIA.  7.  The patient does wear hearing aids.   PAST SURGICAL HISTORY:  1.  Cholecystectomy  in 2004.  2.  Tonsillectomy.  3.  Cataract removal, right eye.   ALLERGIES:  No known drug allergies.   MEDICATIONS:  1.  Actos 45 mg daily.  2.  ___________daily.  3.  Effexor 75 mg daily.  4.  Folic acid 1 mg daily.  5.  methadone/HCL 10 mg one to three p.o. daily p.r.n.  6.  Protonix 40 mg daily.  7.  Plavix 75 mg daily which was stopped in August 2006.  8.  Aspirin 325 mg p.o. daily.   REVIEW OF SYSTEMS:  Please see HPI for significant positive's and  negative's, otherwise negative for coronary artery disease, kidney disease.   SOCIAL HISTORY:  This is a widowed female with three children who lives  alone.  She quit smoking tobacco years ago, and does not drink alcohol.  She  is retired and continues to drive.   FAMILY HISTORY:  Noncontributory.   PHYSICAL EXAMINATION:  VITAL SIGNS:  Blood pressure 128/72 left arm sitting,  heart rate 74, respirations 18.  GENERAL:  This is  a 77 year old Caucasian female in no acute distress.  HEENT:  Normocephalic, atraumatic.  Pupils equal, round, reactive to light  and accommodation.  Extraocular movements were intact.  Oral mucosa is pink  and moist.  Sclerae nonicteric.  NECK:  Supple with no JVD and there is a mild postauricular lymphadenopathy  noted on the right.  The carotid's are palpable, and there is a left carotid  bruit.  RESPIRATORY:  Respirations are symmetrical on inspiration, unlabored, and  clear.  CARDIAC:  Regular rate and rhythm, no murmurs, rubs, or gallops.  ABDOMEN:  Soft, nontender, nondistended, normoactive bowel sounds, flat.  GENITOURINARY:  Deferred.  RECTAL:  Deferred.  EXTREMITIES:  There is no edema or venostasis changes.  There are multiple  small varicosities noted in the bilateral lower extremities.  Temperature of  the bilateral lower extremities is warm.  Pulses:  Radial, femoral,  popliteal, and pedal pulses are 2+ bilaterally.  NEUROLOGIC:  Nonfocal.  The patient is alert and oriented x3.  The  gait is  steady.  Muscle strength is 5/5 throughout all extremities and symmetric.  Deep tendon reflexes are 2+.   ASSESSMENT:  Left internal carotid artery stenosis.   PLAN:  Left carotid endarterectomy by Dr. Madilyn Fireman on June 18, 2005.  Dr.  Madilyn Fireman has seen and evaluated this patient prior to this admission and has  explained the risks and benefits of the procedure, and the patient agrees to  continue.      Pecola Leisure, PA      P. Liliane Bade, M.D.  Electronically Signed    AY/MEDQ  D:  06/16/2005  T:  06/16/2005  Job:  [id number]

## 2011-02-20 NOTE — Assessment & Plan Note (Signed)
[facility]                                   ON-CALL NOTE   GLENYCE, RANDLE                       MRN:          [id number]  DATE:08/09/2006                            DOB:          [birth year]    TIME:  7:20 p.m.   Phone number is [phone].  Caller was Jury Caserta, the daughter.   OBJECTIVE:  Patient has been treated for three months for severe bronchitis.  Has been put on multiple antibiotics and nebulizer treatments four times a  day.  Earlier today, patient set off a defogger in her garage to get rid of  some bugs and realized after she set it off that the door was locked for her  to get into the house.  Realized after she set it off that the door was  locked for her to get into the house.  Had to open the garage door and run  through the garage to get into the house, which she did.  Breathed the  solution for a very brief amount of time.  Has been having some difficulty  breathing, a little bit more  than usual this afternoon.  Called [employer]  [employer], who told them to use steam treatment.  She got a breathing about  three hours ago, and they suggested that she use steam in order to mobilize  secretions.   ASSESSMENT:  Chronic bronchitis with exposure to a defogger.   PLAN:  I suggested she use a breathing treatment tonight that she is  expected to use.  If her breathing gets worse, go to the emergency room or  if she has nausea for the same.   Primary care Wendee Hata is Dr. Posey Rea.  Home office is [facility].    ______________________________  Arta Silence, MD    RNS/MedQ  DD: 08/09/2006  DT: 08/10/2006  Job #: [id number]   cc:   Georgina Quint. Plotnikov, MD

## 2011-02-20 NOTE — Assessment & Plan Note (Signed)
[facility]                             PULMONARY OFFICE NOTE   Monique Fleming, Monique Fleming                       MRN:          [id number]  DATE:01/17/2007                            DOB:          20-Jul-1929    PRIMARY CARE PHYSICIAN:  Georgina Quint. Plotnikov, MD   PROBLEM LIST:  1. Chronic insomnia.  2. Nonrestorative sleep.  3. Sleep movement disorder.  4. Atherosclerosis, carotid endarterectomy.  5. Tobacco abuse.  6. Bronchitis.  7. Colon cancer.   HISTORY:  She has recently been diagnosed with colon cancer, symptomatic  with iron-deficiency anemia.  She has seen Dr. Myna Hidalgo and is pending  surgical evaluation for resection of a right colon mass.  She has begun  smoking again, about 1/2 pack a day.  Pollen is causing a little  rhinitis and postnasal drip.  She notes a minor cough but denies wheeze,  chest pain, or palpitations.  She is sleeping better since she finally  began trying the Requip prescription she has had for awhile.  She  recognizes that does relax her legs.   MEDICATIONS:  1. Citracal 250 mg.  2. Lovastatin 40 mg.  3. Lunesta 2 mg at h.s. p.r.n.  4. Effexor XR 75 mg x3.  5. Plavix 75 mg.  6. Glimepiride 1 mg.  7. Clonazepam 0.5 mg up to 3 times a day p.r.n.  8. Promethazine 1/2 tablet 4 times daily p.r.n.  9. Protonix 40 mg.  10.Potassium 8 mEq.  11.Requip 0.25 mg at h.s.   DRUG INTOLERANCE:  PENICILLIN in childhood.   OBJECTIVE:  VITAL SIGNS:  Weight 143 pounds.  Blood pressure 114/72,  pulse 114.  Room air saturation 98%.  LUNGS:  he has a mild wheeze on expiration, right greater than left,  which she does not seem to recognize.  There is no accessory muscle use,  no cough, no stridor.  GENERAL:  No neck vein distention or peripheral edema.  CARDIAC:  Heart sounds are regular without murmur.  ADENOPATHY:  I do not find adenopathy.   Chest x-ray report from January 16 was a portable film with impression  of emphysema, no  acute findings.  Lungs appear clear.  Heart and  mediastinum unremarkable.   Pulmonary function testing:  On August 30, 2006, had shown moderate  obstructive airways disease with slight response to bronchodilator.  After bronchodilator, FEV1 was 1.41 (71% predicted) with an FEV1-FVC  ratio of 0.53.  Insignificant response to bronchodilator.  Mild air  trapping with residual volume 120, normal total lung capacity.  Diffusion was moderately reduced at 54% of predicted indicating  emphysema.   Nocturnal polysomnogram on March 18, 2006, showed no evidence of  significant sleep apnea.  Her index is 1.4 per hour.  She did have mild  periodic limb movement averaging about 1.9 arousals per hour.   IMPRESSION:  1. Asthma with chronic obstructive pulmonary disease, moderate.  She      may not feel the amount of wheezing that she had.  She will be at      somewhat increased risk of  pulmonary complications with surgery,      but there appears to be no acute process which would delay      necessary surgery.  2. Atherosclerosis with history of cerebrovascular accident.  3. Tobacco abuse with smoking cessation counseling once again done.  4. Colon cancer pending surgery which will be under general      anesthesia.  5. Insomnia component which may partly reflect restless legs based on      her symptomatic response to Requip.   PLAN:  1. Sample Ventolin HFA inhaler 2 puffs 4 times a day p.r.n. with      instruction.  2. Smoking cessation was reemphasized.  3. Scheduled return in 4 months, earlier p.r.n.     Clinton D. Maple Hudson, MD, Tonny Bollman, FACP  Electronically Signed    CDY/MedQ  DD: 01/17/2007  DT: 01/17/2007  Job #: [id number]   cc:   Archer Asa, M.D.  Rose Phi. Myna Hidalgo, M.D.  Georgina Quint. Plotnikov, MD

## 2011-02-20 NOTE — Assessment & Plan Note (Signed)
[facility]                               PULMONARY OFFICE NOTE   Monique Fleming, Monique Fleming                       MRN:          [id number]  DATE:06/12/2006                            DOB:          October 21, 1928    PROBLEM LIST:  1. Insomnia, chronic.  2. Non-restorative sleep.  3. Sleep movement disorder.  4. History of atherosclerotic vascular disease and carotid endarterectomy.  5. Tobacco abuse.  6. Bronchitis.   HISTORY:  She has seen Dr. Donell Beers for anxiety; Dr. Posey Rea and Dr. Jonny Ruiz  for primary care; and Dr. Pearlean Brownie for neurology.  Recently Dr. Jonny Ruiz gave her a  Depo-Medrol injection, prednisone, doxycycline, and an inhaler for a  congested cough.  She says she is somewhat better.  She was very concerned  about fine dust she thought was coming off of the textured walls of her  townhouse.  She denies anything that sounds like a significant dust or mold  problem in the home.  She remained on Doxepin for her sleep and she is  satisfied that she is sleeping better than she was and that probably this is  what she needs to get used to.   MEDICATIONS:  1. Protonix 40 mg.  2. Clonazepam 0.5 mg t.i.d.  3. Citracal 250 mg.  4. Lovastatin 40 mg.  5. Effexor SR 75 mg b.i.d.  6. Restoril or Lunesta.  7. Doxepin 10 mg.  8. Synthroid 250 mcg.  9. Metformin.   DRUG INTOLERANCE:  1. PENICILLIN.  2. LIPITOR.   OBJECTIVE:  VITAL SIGNS:  Weight 148 pounds, BP 122/62, pulse regular 95,  room air saturation 100%.  PULMONARY:  Congested nonproductive cough without wheeze, dullness, rhonchi,  or increased work of breathing.  I found no adenopathy.  NECK:  No neck vein distention or stridor.  HEART:  Sounds are regular without murmur or gallop.  EXTREMITIES:  There was no cyanosis or edema.   IMPRESSION:  1. Acute bronchitis.  2. Chronic insomnia is somewhat better controlled.   PLAN:  1. Nebulizer treatments with Xopenex 1.25 mg.  2. Mucinex.  3. Sample  Asmanex 1 puff b.i.d. with instruction to rinse.  We plan no      prescription after she finishes the sample.  4. Schedule return in 1 month, earlier p.r.n.                                   Clinton D. Maple Hudson, MD, FCCP, FACP   CDY/MedQ  DD:  06/11/2006  DT:  06/12/2006  Job #:  [id number]

## 2011-02-20 NOTE — Discharge Summary (Signed)
NAME:  SARIN, COMUNALE NO.:  [id number]   MEDICAL RECORD NO.:  [id number]          PATIENT TYPE:  INP   LOCATION:  3306                         FACILITY:  [facility]   PHYSICIAN:  Balinda Quails, M.D.    DATE OF BIRTH:  [birth year]   DATE OF ADMISSION:  06/18/2005  DATE OF DISCHARGE:  06/19/2005                                 DISCHARGE SUMMARY   PRIMARY DIAGNOSES:  1.  Left internal carotid artery stenosis.  2.  Transient ischemic attack.   IN-HOSPITAL DIAGNOSIS:  Hypokalemia.   SECONDARY DIAGNOSES:  1.  Diabetes mellitus.  2.  Anxiety.  3.  Anemia.  4.  Hyperlipidemia.  5.  Left hemispheric transient ischemic attack.  6.  Patient wears hearing aids.  7.  Status post cholecystectomy.  8.  Status post tonsillectomy.  9.  Cataract removal, right eye.   ALLERGIES:  No known drug allergies.   IN-HOSPITAL OPERATIONS AND PROCEDURES:  Left carotid endarterectomy with  Dacron patch angioplasty.   HISTORY AND PHYSICAL AND HOSPITAL COURSE:  Ms. Liska is a 77 year old female  who suffered a transient ischemic attack associated with a moderate to  severe left internal carotid artery stenosis.  The patient was referred for  evaluation and it was recommended to her that she undergo a left carotid  endarterectomy for reduction of stroke risk.  Risks and benefits were  discussed with the patient.  She acknowledged her understanding and wished  to proceed.  Surgery was scheduled for June 18, 2005.   The patient was taken to the operating room on June 18, 2005, where she  underwent left carotid endarterectomy with Dacron patch angioplasty.  The  patient tolerated this procedure well and was transferred up to the  intensive care unit.  Immediately post surgery the patient neurologically  was seen to be intact.  She was also seen to be hemodynamically stable.  Her  postoperative course was pretty much unremarkable.  The patient was out of  bed ambulating  postoperative day 1.  She was able to be weaned off the  oxygen saturating greater than 90% prior to discharge.  She did have slight  hypokalemia postoperative day 1, which was replaced.  Incision was dry and  intact and healing well.  The patient was in normal sinus rhythm.  Her  neurologic exam remained intact during her hospital stay.  The patient  remained afebrile.   The patient was discharged to home on June 19, 2005, postoperative day  1, in stable condition.  A follow-up appointment was scheduled with Dr.  Madilyn Fireman for July 20, 2005, at 11:20 a.m.  Ms. Nelson received instructions  on diet, activity level and incisional care.  She was told no driving until  released to do so, no heavy lifting over 10 pounds.  The patient was told  she was allowed to shower and wash her incisions using soap and water.  She  is to contact the office if she develops any drainage or opening from any of  her incision sites or fever greater than 101.1 degrees.  The patient was  told to ambulate three to four times per day, progress as tolerated, and to  continue her breathing exercises.  She was educated on diet to be low-fat,  low-salt.  She acknowledged her understanding.   DISCHARGE MEDICATIONS:  1.  Tylox one to two tablets p.o. q.4-6h. p.r.n. pain.  2.  Actos 45 mg daily.  3.  Effexor 75 mg daily.  4.  Folic acid 1 mg daily.  5.  Methadone 10 mg b.i.d. p.r.n.  6.  Protonix 40 mg daily.  7.  Plavix 75 mg daily.  8.  Aspirin 81 mg daily.  9.  Klonopin 0.5 mg t.i.d. p.r.n.      Stephanie Acre Dominick, PA      P. Liliane Bade, M.D.  Electronically Signed    KMD/MEDQ  D:  08/18/2005  T:  08/19/2005  Job:  [id number]

## 2011-02-20 NOTE — Assessment & Plan Note (Signed)
[facility]                               PULMONARY OFFICE NOTE   ROANNE, HAYE                       MRN:          [id number]  DATE:07/30/2006                            DOB:          1929/08/21    PRIMARY CARE PHYSICIAN:  Dr. Posey Rea.   PSYCHIATRY DOCTOR:  Dr. Donell Beers.   PROBLEMS:  1. Insomnia, chronic.  2. Non-restorative sleep.  3. Sleep movement disorder.  4. History of atherosclerotic disease and carotid endarterectomy.  5. Tobacco abuse.  6. Bronchitis with dyspnea.   HISTORY:  She complained of dyspnea and saw Dr. Gala Romney in cardiac  consultation.  He did not think her complaints of fatigue and weakness were  cardiogenic.  Her medical problems and her willingness to undergo testing  made assessment a little complicated.  She indicates she is smoking and  blames nervousness.  She reports having been on many antibiotics and is  concerned about developing tolerance during treatment of bronchitic  complaints.  She says using a nebulizer at home 4 times a day does not help,  doesn't break it loose.  She thinks an Asmanex inhaler did better.  I  talked with her a little bit about the medication physiology.  A CT  angiogram of the chest was negative October 9.  Her daughter called the  rescue squad to the home about 3 weeks ago, but apparently they did not find  much of anything.  She indicates some respiratory soreness in the lower  lateral ribcage bilaterally.  She is not having much sputum and she denies  palpitation.  She is not sure how many years it has been since she had a  pneumonia vaccine or if she ever had 1 before, and would like to have flu  and pneumonia vaccine today.  She does not complain as much today about her  sleep pattern.  We talked a little about whether her diabetes or her  previous vascular disease issues, including cerebrovascular, could have any  influence on her symptoms and concerns.   MEDICATIONS:  1. Protonix 40 mg.  2. Citrucel 250 mg.  3. Vitamin D.  4. Lovastatin 40 mg.  5. Multivitamins.  6. Restoril 15 mg at bedtime.  7. Doxepin 10 mg at bedtime.  8. Clonazepam 0.5 mg b.i.d.  9. She has used Rozerem 8 mg at bedtime.  10.Metformin 500 mg 1 or 2 daily.  11.Darvocet.  12.Xopenex 1.25 mg.  13.Complains that albuterol makes her jittery.   DRUG INTOLERANCES:  PENICILLIN and LIPITOR.   OBJECTIVE:  Weight 153 pounds, BP 142/69, pulse regular 90, room air  saturation 97%.  Trace wheeze heard transiently, mainly in the right posterior base.  She is  not coughing.  Work of breathing is not increased.  She has put on a little weight and she blames prednisone, which she is now  tapering.  Heart sounds are regular with a rare extra beat heard.  She does not have  neck vein distension or edema.   IMPRESSION:  1. Chronic insomnia.  I think there is an anxiety piece  to this and there      may be some cerebrovascular disease component as well.  We will spend      more time talking about it as she needs.  I would like not to add more      medication if at all possible.  We have reviewed issues of good sleep      hygiene.  2. Dyspnea related to her bronchitis pattern.  We need a PFT to see where      we are with this.  I will try to sort out the problems with which I can      be of some help.  I have asked her to watch for symptoms of reflux and      she may do best to tolerate mild bronchitis symptoms rather than adding      more medication currently.   PLAN:  1. Reassurance and education.  2. Schedule PFT.  3. Flu vaccine.  4. Pneumococcal vaccine booster.  5. Schedule return in 1 month, earlier p.r.n. after she has had her PFT.     Clinton D. Maple Hudson, MD, FCCP, FACP    CDY/MedQ  DD: 07/31/2006  DT: 08/02/2006  Job #: [id number]

## 2011-02-20 NOTE — Op Note (Signed)
NAME:  Monique Fleming, Monique Fleming NO.:  [id number]   MEDICAL RECORD NO.:  [id number]                   PATIENT TYPE:  OBV   LOCATION:  0354                                 FACILITY:  [facility]   PHYSICIAN:  Jetty Duhamel, M.D.          DATE OF BIRTH:  09-30-1929   DATE OF PROCEDURE:  03/22/2003  DATE OF DISCHARGE:                                 OPERATIVE REPORT   PREOPERATIVE DIAGNOSIS:  Symptomatic cholelithiasis.   POSTOPERATIVE DIAGNOSIS:  Symptomatic cholelithiasis.   PROCEDURES:  1. Laparoscopic cholecystectomy.  2. Intraoperative cholangiogram.   SURGEON:  Marta Lamas. Gae Bon, M.D.   ASSISTANT:  Anselm Pancoast. Zachery Dakins, M.D.   ANESTHESIA:  General endotracheal.   ESTIMATED BLOOD LOSS:  Less than 20 mL.   COMPLICATIONS:  None.   CONDITION:  Stable.   FINDINGS:  Normal cholangiogram with 1-4 mm pigmented gallstones obtained  within.   DISPOSITION:  To PACU, then to the surgical floor when stable.   INDICATIONS FOR SURGERY:  The patient is a 77 year old female who was seen  in my office on March 06, 2003, with abdominal pain, discomfort, normal EGD  and an ultrasound demonstrating gallstones.  She was brought to surgery for  a laparoscopic cholecystectomy.   DESCRIPTION OF PROCEDURE:  The patient was taken to the operating room and  placed on the table in the supine position.  After an adequate endotracheal  anesthesia was administered, she was prepped and draped in the usual sterile  manner with scopes in the midline and right upper quadrant.   The patient was placed in reverse Trendelenburg.  The supraumbilical  curvilinear incision was made using a #15 blade.  It was taken down to the  midline fascia through which a Veress needle was subsequently passed into  the peritoneal cavity and confirmed to be in position using the saline test.  Once this was done, carbon dioxide insufflation was instilled through the  Veress needle into the  peritoneal cavity up to a maximum intraabdominal  pressure of 13 mmHg.  Once this was done, we used a blunt tipped 10/11 mm  trocar and cannula and passed it using laparoscopic guidance into the  peritoneal cavity.  Once this was in adequate position, we removed the inner  cannula, connecting the carbon dioxide gas to confirm position of the  cannula using the laparoscope with the attached cannula as well as  visualization.   Subsequently two right costal margin 5 mm cannulas in the subxiphoid, 11/12  mm cannula were passed into the peritoneal cavity under direct vision.  Once  they were both in place, the patient was placed in reverse Trendelenburg,  the left side was tilted down and dissection begun.   The dome of the gallbladder was grasped using a __________ grasper passed  through the lateral most 5 mm cannula.  A second one was placed on the  infundibulum.  We exposed  the peritoneum overlying the triangle of Calot and  hepatoduodenal triangle.  We dissected out the cystic duct and the cystic  artery and both ligaments, isolating initially the artery which was just  proximal to a gallbladder lymph node.  We endoclipped the artery, doubly  proximal and distal and then subsequently isolated the cystic duct  adequately in order to perform a choledochotomy for the cholangiogram.   A Reddick catheter was passed through a 14 gauge catheter passed through the  anterior abdominal wall and then subsequently passed into the  choledochotomy.  I removed all instruments, deflated the abdomen and  subsequently performed a cholangiogram which showed good flow into the  duodenum.  No evidence of intraductal stones, good proximal flow and no  dilatation of the common bile duct.   Once this was done, we removed the clip that secured the catheter in place  and removed the catheter from the opening in the cystic duct, then doubly  clipped the cystic duct distally and then once again proximally along  the  gallbladder side.  Once this was done, we transected the cystic duct and  subsequently dissected the gallbladder out of its bed with minimal  difficulty.  No injury to the gallbladder using electrocautery in order to  remove the gallbladder from the supraumbilical site.   We subsequently closed that site using the figure-of-eight stitch of #0  Vicryl passed on an UR-6 needle.  We inspected the gallbladder bed prior to  closure, found there to be minimal bleeding.  What bleeding there was we  cauterized.  There was no evidence of any severe bleeding pooling.  All  needle counts, sponge counts and instrument counts were correct at that  point.  We irrigated with about 500 mL of normal saline solution.  We  removed all cannulas while aspirating above the liver to remove most of the  gas in the subdiaphragmatic area.   Once the fascia was closed and all cannulas removed, we reapproximated the  skin using running subcuticular stitch of 4-0 Vicryl.  Marcaine 0.25% was  injected in all incision sites.  Sterile dressings were applied.  All needle  counts, sponge counts and instrument counts were correct.                                               Jetty Duhamel, M.D.    JOW/MEDQ  D:  03/22/2003  T:  03/22/2003  Job:  [id number]

## 2011-02-20 NOTE — Assessment & Plan Note (Signed)
[facility]                              CARDIOLOGY OFFICE NOTE   Monique Fleming, Monique Fleming                       MRN:          [id number]  DATE:07/07/2006                            DOB:          [birth year]    REASON FOR CONSULTATION:  Weakness.   HISTORY OF PRESENT ILLNESS:  Monique Fleming is a 77 year old woman with multiple  medical problems including diabetes, cerebrovascular disease, and presumed  COPD with ongoing tobacco use, who is referred for further evaluation of  weakness and fatigue.  Monique Fleming denies any history of known coronary artery  disease.  Monique Fleming has never had a cardiac catheterization.  Monique Fleming tells me that in  2004, they thought Monique Fleming was having a heart attack and Monique Fleming had a Myoview which  showed an EF of 73% and no ischemia.  Monique Fleming says it turned out to be low  potassium.  Monique Fleming denies any history of chest pain.  Monique Fleming does have a history  of cerebrovascular disease with previous TIAs and left carotid  endarterectomy.  Over the past eight weeks, Monique Fleming has felt sick and been  troubled with severe bronchitis and sinusitis.  Monique Fleming has been on multiple  rounds of antibiotics and finally was started on a nebulizer.  Monique Fleming now says  Monique Fleming is starting to feel better.  Throughout this, Monique Fleming has felt very weak.  Monique Fleming denies any chest pain or severe dyspnea, though Monique Fleming is quite sedentary.  Monique Fleming says Monique Fleming does not walk because it is too hot outside.  Monique Fleming denies any  claudication.   Monique Fleming apparently was referred for stress testing to further evaluate her  weakness, however, Monique Fleming has refused this as her husband died the same day  after a stress test.  Monique Fleming is, thus, referred here for further evaluation.   On review of systems, her main complaint is leg cramps.  Monique Fleming denies any  nausea, vomiting, fevers, chills, or abdominal pain.  The remainder of the  review of systems is negative.   PAST MEDICAL HISTORY:  1. Diabetes.  2. Cerebrovascular disease with a history of TIA  status post left carotid      endarterectomy.  3. Ongoing tobacco use with probable COPD.  4. Anxiety/depression.  5. Hyperlipidemia.  6. History of renal cysts.   CURRENT MEDICATIONS:  Protonix 40 a day, Citracal 250 a day, Lovastatin 40 a  day, multi-vitamin, Restoril, Aggrenox, Doxepin, Clonazepam, and Metformin  500, 1-2 tablets a day.   ALLERGIES:  PENICILLIN AND LIPITOR.   SOCIAL HISTORY:  Monique Fleming is widowed, Monique Fleming has two children.  Monique Fleming has a history of  tobacco use, half pack per day x30 years.  Over the past 2-3 years, Monique Fleming has  now cut down to 2-3 cigarettes a day.  Monique Fleming denies alcohol.  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   FAMILY HISTORY:  Her mother died at age 40, Monique Fleming told me Monique Fleming was diagnosed  with problems with her heart late in life.  Father died at 37 due to  abdominal aortic aneurysm.  One brother died from cancer, another brother  died from coronary artery disease and a complication from CABG at age 50.   PHYSICAL EXAMINATION:  GENERAL:  Monique Fleming is an elderly quite talkative lady in no acute distress.  Ambulates around the clinic without any significant difficulty.  Her speech  is quite nasal, Monique Fleming is congested.  VITAL SIGNS:  Blood pressure 100/70, heart rate 100, weight 149.  HEENT:  Sclerae anicteric, EOMI, there is no xanthelasma.  Mucous membranes  are moist.  NECK:  Supple, no JVD, there is a left carotid endarterectomy scar, there is  a left carotid bruit, I did not hear one on the right.  There is no  lymphadenopathy or thyromegaly.  CARDIAC:  Monique Fleming has distant heart sounds.  Monique Fleming has a regular rate and rhythm.  Monique Fleming has a soft, brief, systolic ejection murmur at the right sternal border,  plus S4, no gallop, no rub.  LUNGS:  Very poor air movement throughout with a long expiratory phase and  occasional mild wheezing.  ABDOMEN:  Soft, nontender, nondistended, no hepatosplenomegaly, no bruits,  no masses appreciated  EXTREMITIES:  Warm without cyanosis, clubbing, and edema.   DP pulses are 1+  bilaterally.  NEUROLOGICAL:  Monique Fleming is alert and oriented x 3, cranial nerves 2-12 are  intact, Monique Fleming moves all four extremities without difficulty.   EKG shows normal sinus rhythm at a rate of 100, no ST-T wave abnormalities,  intervals and axis' are normal.   ASSESSMENT/PLAN:  Fatigue/weakness.  I do not think this is cardiac in  origin.  Given her multiple cardiac risk factors, Monique Fleming probably does have  some underlying coronary disease but this does not seem to be a limiting  factor here.  Typically, I would agree with a screening stress test in this  situation, however, Monique Fleming adamantly refuses this.  I do not think there is an  indication here, at this time, for cardiac catheterization.  I do suspect  that Monique Fleming has significant underlying lung disease and agree with pursuing  PFTs.  Regarding her medications, I would keep her on an aspirin and statin.  Considering her diabetes, it may be reasonable to also consider adding an  ACE inhibitor.   DISPOSITION:  Monique Fleming will follow up with Dr. Posey Rea.  I will be happy to see  her back on a p.r.n. basis.       Bevelyn Buckles. Bensimhon, MD     DRB/MedQ  DD:  07/07/2006  DT:  07/08/2006  Job #:  [id number]

## 2011-02-20 NOTE — Assessment & Plan Note (Signed)
Monique Fleming                             PULMONARY OFFICE NOTE   Monique Fleming                       MRN:          [id number]  DATE:09/17/2006                            DOB:          [birth year]    PROBLEM:  1. Chronic insomnia.  2. Non-restorative sleep.  3. Sleep movement disorder.  4. Atherosclerosis/carotid endarterectomy.  5. Tobacco abuse.  6. Bronchitis.   HISTORY:  She says she has been weak, and that it was caused anemia and  a urinary infection.  She had some transient breathing trouble in early  November after exposed to an insect regional fogger in the closed space  of her garage, but that resolved.  She rotates her sleeping pills  between Restoril, doxepin, Rozerem and Lunesta, and seems to feel fair  control of her insomnia with this regimen.  In the last 2 nights she had  mild cough, which she says is not bad, but she has some Phenergan with  Codeine cough syrup that helps.  She expresses concern about glands.  What she points to are normal neck muscles.  She asks for an ENT  referral for evaluation of a history of nerve deafness, and also a full  feeling in her throat.  She says she does not want to go to a hearing  aid salesman.  She is not very specific about the fullness in the  throat, which is not painful, and does not seem to interfere with  swallowing or breathing.   MEDICATIONS:  1. Protonix 40 mg.  2. Citracal 250 mg with vitamin D.  3. Lovastatin 40 mg.  4. Restoril 15 mg or Lunesta 1 mg or doxepin 10 mg or Rozerem 8 mg.      Some of these medicines have been previously cancelled, but      apparently she has a few leftovers and just is not taking much.  5. She continues metformin 500 mg 1 or 2 daily.   DRUG INTOLERANCE:  1. PENICILLIN.  2. LIPITOR.   She has had an albuterol inhaler, used infrequently, and complains it  makes her jittery.   OBJECTIVE:  Weight 149 pounds.  BP 120/72.  Pulse regular at 98.   Room  air saturation 97%.  She is overweight, alert.  Breathing is unlabored and lung fields are clear.  HEART:  Sounds regular without murmur.  There is no peripheral edema or cyanosis.  I find no adenopathy, specifically at the neck.  There is no neck vein  distention or stridor.  Her pharynx looks clear.   PFT:  Her PFT of November 26th showed moderate obstructive airways  disease with slight response to bronchodilator.  Normal lung volumes.  Diffusion moderately reduced.  Her FEV1 was 1.41 L (71% predicted),  after bronchodilator FEV1/FVC ratio was 0.53.   IMPRESSION:  1. Asthma with chronic obstructive pulmonary disease, currently      stable, noting that she quit smoking a month ago for which she was      congratulated.  2. Chronic insomnia.  I have discussed her medication rotation.  I do      not think we need to fill all of those as she comes due.  We have      reviewed sleep hygiene issues again.  3. Complaints of nerve deafness and full feeling in the throat.  I      will help with her request for Ears, Nose and Throat referral.   PLAN:  1. Rescheduling Ears, Nose and Throat visit with Dr. Lazarus Fleming.  2. Encouraged her to remain off of cigarettes.  3. Walk for endurance and weight loss.  4. Schedule return 4 months, earlier p.r.n.     Monique D. Maple Hudson, MD, Monique Fleming, FACP  Electronically Signed    CDY/MedQ  DD: 09/17/2006  DT: 09/18/2006  Job #: [id number]   cc:   Monique Quint. Plotnikov, MD  Monique Fleming, M.D.

## 2011-02-20 NOTE — Op Note (Signed)
NAME:  Monique Fleming, Monique Fleming NO.:  [id number]   MEDICAL RECORD NO.:  [id number]          PATIENT TYPE:  INP   LOCATION:  3306                         FACILITY:  [facility]   PHYSICIAN:  Balinda Quails, M.D.    DATE OF BIRTH:  Dec 19, 1928   DATE OF PROCEDURE:  06/18/2005  DATE OF DISCHARGE:                                 OPERATIVE REPORT   SURGEON:  Denman George, MD   ASSISTANT:  Di Kindle. Edilia Bo, MD and Zadie Rhine, [place].   ANESTHETIC:  General endotracheal.   ANESTHESIOLOGIST:  Sheldon Silvan, MD   PREOPERATIVE DIAGNOSES:  1.  Moderate-to-severe left internal carotid artery stenosis.  2.  Transient ischemic attack.   POSTOPERATIVE DIAGNOSES:  1.  Moderate-to-severe left internal carotid artery stenosis.  2.  Transient ischemic attack.   PROCEDURE:  Left carotid endarterectomy with Dacron patch angioplasty.   CLINICAL NOTE:  Monique Fleming is a 77 year old female who suffered a  transient ischemic attack associated with a moderate-to-severe left internal  carotid artery stenosis.  She was referred for evaluation and it was  recommended she undergo left carotid endarterectomy for reduction of stroke  risk.  She consented for surgery.  Risks and benefits of the operative  procedure were explained to the patient in detail, the major morbidity and  mortality associated with this procedure of 1% to 2% to include, but not  limited to, MI, CVA, cranial nerve injury and death.   OPERATIVE PROCEDURE:  The patient was brought to the operating room stable  condition.  Placed in supine position.  General endotracheal anesthesia  induced.  Foley catheter and arterial line inserted.  Left neck prepped and  draped in a sterile fashion.   Curvilinear skin incision was made along the anterior border of the left  sternomastoid muscle.  Subcutaneous tissue and platysma were divided with  electrocautery.  Deep dissection carried down along the anterior border of  the  sternomastoid to the carotid sheath.  The facial vein ligated with 2-0  silk and divided.  The carotid bifurcation exposed.  The common carotid  artery mobilized down to the omohyoid muscle and encircled with a Vesseloop.  The vagus nerve reflected posteriorly and preserved.  The origin of the  superior thyroid and the external carotid were freed and encircled with  Vesseloops.  The internal carotid artery then followed distally up to the  posterior belly of the digastric muscle and encircled with a Vesseloop at  that level.  The hypoglossal nerve was reflected superiorly and preserved.   Evaluation of the carotid bifurcation did reveal extensive calcified plaque  extending approximately 2-3 cm up into the left internal carotid artery.  Beyond this, the artery was soft.   The patient was administered 6000 units of heparin intravenously.  Adequate  circulation time permitted.  The carotid vessels were controlled with  clamps.  A longitudinal arteriotomy made in the distal common carotid  artery.  The arteriotomy extended across carotid bulb and up into the  internal carotid artery.  There was extensive calcified plaque in the left  internal carotid artery  with a moderate-to-severe stenosis.  A shunt was  inserted.   An endarterectomy elevator was then used to remove the plaque.  The  endarterectomy carried down into the common carotid artery, where the plaque  was divided transversely with Potts scissors.  The plaque then raised up  into the bulb where the superior thyroid and external carotid were  endarterectomized using an eversion technique.  The distal internal carotid  artery plaque feathered out well.   Fragments of plaque were then removed with plaque forceps.  The site  irrigated with heparin and saline solution.   A patch angioplasty of the endarterectomy site was then carried out using  running 6-0 Prolene suture with a Finesse Dacron patch.  At completion of  the patch  angioplasty, the shunt was removed.  All vessels were well-  flushed.  Clamps were removed, directing the initial antegrade flow up the  external carotid artery, following this, the Internal carotid was released.   There is an excellent pulse and Doppler signal in the distal internal  carotid artery.  The patient was administered 30 mg of protamine  intravenously.  Adequate hemostasis was obtained.  Sponges and instrument  counts were correct.   The sternomastoid fascia was then closed with running 2-0 Vicryl suture.  Platysma closed with running 3-0 Vicryl suture.  Skin closed with 4-0  Monocryl.  Steri-Strips applied.  Sterile dressing applied.   Anesthesia was reversed in the operating room.  The patient awakened  readily, moved all extremities to command.  Transferred to the recovery room  in stable condition.      Balinda Quails, M.D.  Electronically Signed     PGH/MEDQ  D:  06/18/2005  T:  06/19/2005  Job:  [id number]   cc:   Pramod P. Pearlean Brownie, MD  Fax: [phone]   Georgina Quint. Plotnikov, M.D. [facility]  [address]  [place]  [place] [address]

## 2011-02-20 NOTE — Discharge Summary (Signed)
NAME:  Monique Fleming, Monique Fleming NO.:  [id number]   MEDICAL RECORD NO.:  [id number]          PATIENT TYPE:  INP   LOCATION:  1422                         FACILITY:  [facility]   PHYSICIAN:  Ardeth Sportsman, MD     DATE OF BIRTH:  January 26, 1929   DATE OF ADMISSION:  02/10/2007  DATE OF DISCHARGE:  02/14/2007                               DISCHARGE SUMMARY   PRIMARY CARE PHYSICIAN:  Georgina Quint. Plotnikov, M.D.   GASTROENTEROLOGIST:  Wilhemina Bonito. [name], M.D.   HEMATOLOGIST/ONCOLOGIST:  Rose Phi. Myna Hidalgo, M.D.   CARDIOLOGIST:  [facility] with Drs. Freeborn and Bensimhon.   PRIMARY DIAGNOSES:  1. Colon adenocarcinoma.  2. Cecal polyp.  3. Chronic insomnia with nonobstructive sleep apnea.  4. Sleep movement disorder.  5. Atherosclerosis.  6. Tobacco abuse.  7. Bronchitis.  8. Asthma with question of COPD.  9. Hypercholesterolemia.  10.Gastroesophageal reflux disease.  11.Question of an esophageal nodule.  12.Mild depression.  13.Mild anxiety.  14.Cataracts.  15.Question of diabetes.  16.Status post cervical diskectomy 2004.  17.Status post carotid endarterectomy in 2006.  18.Status post tonsillectomy.   PROCEDURE PERFORMED:  Laparoscopically assisted right hemicolectomy with  lysis of adhesions on Feb 10, 2007.   SUMMARY OF HOSPITAL COURSE:  Ms. Baine is a 76 year old female who was  found on colonoscopy to have a cecal polyp that was completely removed  but a persistent nodule in her ascending colon.  Biopsy was consistent  with adenocarcinoma.  Options discussed and she underwent a  laparoscopically assisted right hemicolectomy on Feb 10, 2007.   She was advanced on her diet and by the time of discharge she was  tolerating a solid diet, eating 75-100% of her meals.  She was urinating  fine.  She had had a couple of bowel movements and was passing flatus.  Her appetite and energy level improved.  She did have some hypokalemia  with her potassium in the low 3s and with  some concurrent hypomagnesemia  with a potassium initially of 1.  These were replaced and although they  were on the low end of normal, they had improved into the normal range.  She had no cardiac events or telemetry abnormalities and was taken off  telemetry.  She was able to keep herself hydrated off an IV for over 24  hours.   Based on these improvements, we thought it was reasonable for her to be  discharged with the following instructions:  1. She is to return to clinic to see me next week for followup as      pathology is not back yet.  2. She should drink plenty of liquids and try to stick to a low      residue, high potassium, high magnesium diet.  3. She should call if she has any fevers, chills, sweats, nausea,      vomiting, terrible diarrhea, or obstipation or constipation or      other concerns.  4. She can shower everyday, keep her incision clean and dry.  Call if      there is any drainage or bleeding  or oozing.  5. She should use Tylenol 325-650 p.o. every 6 hours p.r.n. pain for      pain control or ibuprofen 200-400 mg p.o. every 6 hours p.r.n.      pain.  She should use Percocet 1-2 p.o. every 4 hours p.r.n. pain,      5/325, #40.  6. She can do warm showers and heating pads and warm baths as needed      for pain control as well.      Ardeth Sportsman, MD  Electronically Signed     SCG/MEDQ  D:  02/14/2007  T:  02/14/2007  Job:  [id number]

## 2011-02-20 NOTE — Procedures (Signed)
NAME:  Monique Fleming, Monique Fleming NO.:  [id number]   MEDICAL RECORD NO.:  [id number]          PATIENT TYPE:  OUT   LOCATION:  SLEEP CENTER                 FACILITY:  [facility]   PHYSICIAN:  Clinton D. Maple Hudson, M.D. DATE OF BIRTH:  03/25/1929   DATE OF STUDY:  03/18/2006                              NOCTURNAL POLYSOMNOGRAM   REFERRING PHYSICIAN:  Dr. Jetty Duhamel.   INDICATIONS FOR STUDY:  Insomnia with sleep apnea.   EPWORTH SLEEPINESS SCORE:  02/24.   BMI:  23.8.   WEIGHT:  140 pounds.   HOME MEDICATION:  Restoril, Protonix, clonazepam, Actos, folic acid,  Citracal, lovastatin, Effexor, Aggrenox, Darvocet.Marland Kitchen   SLEEP ARCHITECTURE:  Total sleep time short, 256 minutes with sleep  efficiency 70%.  Stage I was 4%, stage II 96%, stages III, IV and REM were  absent.  Sleep latency 100 minutes, awake after sleep onset 13 minutes,  arousal index 13.1.  Sleep onset was at 12:21 a.m.  She had taken Restoril  at 9:40 p.m. and again around 12:15 a.m..   RESPIRATORY DATA:  Apnea/hypopnea index (AHI, RDI) 1.4 obstructive events  per hour which is within normal limits (normal range 0 to 5 per hour).  This  reflected one obstructive apnea and five hypopneas.  Events were mostly  recorded while sleeping on her sides.   OXYGEN DATA:  Mild intermittent snoring with oxygen desaturation to a nadir  of 90%.  Mean oxygen saturation through the study was 92% on room air.   CARDIAC DATA:  Normal sinus rhythm.   MOVEMENT/PARASOMNIA:  A total of 340 limb jerks were recorded of which eight  were associated with arousal or awakening.  No complex behaviors or  parasomnias were described.   IMPRESSION/RECOMMENDATIONS:  1.  Late sleep onset is consistent with her habitual sleep pattern, even      assisted by Restoril as she uses at home.  Sleep was subsequently well      maintained.  Some attention to sleep hygiene including appropriate      exposure to morning daylight to help set circadian  rhythm, and an      appropriate distracting and relaxing regimen in the evening may help set      an earlier sleep time if appropriate.  Absence of the deeper sleep      stages and REM is noted.  REM may be suppressed by antidepressant      therapies but this may also reflect cerebrovascular disease or      depression if consistent as the home pattern.  2.  Insignificant sleep disordered breathing, AHI 1.4 per hour.  3.  Periodic limb movement with arousal was relatively minor at 1.9 per hour      and probably not clinically significant.  However considerable mild      nonspecific movement was noted and this may relate to her impression      that she is active in sleep at home, tossing and turning and pulling up      her sheets at times.  The possibility of more specific sleep related      movement disturbance cannot be entirely  excluded.      Clinton D. Maple Hudson, M.D.  Diplomate, Biomedical engineer of Sleep Medicine  Electronically Signed     CDY/MEDQ  D:  03/21/2006 10:58:46  T:  03/22/2006 11:41:50  Job:  [id number]

## 2011-02-20 NOTE — H&P (Signed)
NAME:  Monique Fleming, Monique Fleming NO.:  [id number]   MEDICAL RECORD NO.:  [id number]          PATIENT TYPE:  EMS   LOCATION:  ED                           [facility]   PHYSICIAN:  Bevelyn Buckles. Bensimhon, MDDATE OF BIRTH:  01-Aug-1929   DATE OF ADMISSION:  06/27/2006  DATE OF DISCHARGE:  06/27/2006                              HISTORY & PHYSICAL   CHIEF COMPLAINT:  Chest pain.   HISTORY OF PRESENT ILLNESS:  Monique Fleming is a 77 year old white female,  past medical history significant for a CVA, diabetes, hypertension and  continued tobacco use and probable COPD presenting with 1 episode of  sharp chest pain.  The patient states that today while walking in her  kitchen, she experienced substernal chest pain that was sharp in nature  and seemed to radiate to her back and right neck.  She states that the  pain only lasted about a minute, at which time she sat down on her  couch.  Upon sitting on the couch, the patient states she experienced  some pressure in her back, but is unable to recall how long this  pressure lasted.  Patient endorsed associated shortness of breath;  however, she states that she always has this baseline shortness of  breath and this was not much worse than normal.  She denies any  associated nausea, vomiting or diaphoresis.  She also denies any past  history of similar events.  Here in the emergency room, patient has  complained of some left breast pain that is sharp in nature.  However,  she states that this is different than the pain she experienced earlier  on this evening.   PAST MEDICAL HISTORY:  1. Cerebrovascular accident, status post left carotid endarterectomy.  2. Diabetes.  3. Hyperlipidemia.  4. Remote history of hypothyroidism, for which she is no longer      treated.  5. Renal cysts.  6. Probable chronic obstructive pulmonary disease.   SOCIAL HISTORY:  She has about 15 pack years and continues to smoke  about 2-3 cigarettes a day.   She denies any alcohol use.  States that  she has 2 children.   FAMILY HISTORY:  Negative for premature coronary artery disease.   ALLERGIES:  AS A CHILD, PATIENT WAS TOLD SHE WAS ALLERGIC TO PENICILLIN.  SHE HAS ALSO HAD INTOLERANCE TO LIPITOR.   CURRENT MEDICATIONS:  1. Lovastatin 40 mg p.o. daily.  2. Plavix 75 mg p.o. daily.  3. Metformin 500 mg p.o. b.i.d.  4. Protonix 40 mg p.o. daily.  5. Temazepam 15 mg p.o. q.h.s.  6. Clonazepam 0.5 mg p.o. b.i.d.  7. Atrovent nebulizers p.r.n.   REVIEW OF SYSTEMS:  As in HPI. Also, the patient endorses difficulty  sleeping on a routine basis.  Patient also endorses difficulty walking  up flights of stairs or long distances secondary to shortness of breath.  All other systems reviewed and are negative.   PHYSICAL EXAMINATION:  VITAL SIGNS:  Temperature of 98.5.  Pulse of 94.  Respirations 23.  Blood pressure 154/75.  GENERAL:  She is in no  acute distress.  HEENT:  Normocephalic, atraumatic.  Pupils equal, round and reactive to  light.  Extraocular movements are intact.  NECK:  Supple without JVD or bruits.  HEART:  Regular rate and rhythm with a 2/6 holosystolic murmur heard  best at the left lower sternal border.  LUNGS:  Clear to auscultation bilaterally.  ABDOMEN:  Soft, nontender, nondistended.  Positive bowel sounds.  EXTREMITIES:  Without edema.  NEURO EXAM:  Nonfocal.  SKIN:  No obvious rashes or lesions.  PSYCH:  Patient is appropriate, but appears anxious.   Chest x-ray is clear.  Positive for mildly flattened diaphragms.  EKG,  independently reviewed by myself, reveals normal sinus rhythm with a  flattened T-wave in 1 and AVL.   LABS:  Sodium 141, potassium 2.9, chloride 102, BUN 4, creatinine 0.6,  glucose 169, white count 8.7, hemoglobin 9.8, hematocrit 30.4, platelets  421.  CK-MB 2.1, troponin less than 0.05, INR 1.2.  Venous blood gas had  a pH of 7.37 and a PCO2 of 44.7.  BNP was 68.   ASSESSMENT:  1. Atypical  chest pain.  2. Chronic obstructive pulmonary disease.  3. Hypokalemia.  4. History of cerebrovascular accident.  5. Hyperlipidemia.  6. Diabetes mellitus.   PLAN:  We will admit the patient to telemetry and rule her out for a  myocardial infarction with serial troponins.  The chest pain she is  describing is atypical for a cardiac origin; however, she does have  several risk factors.  After she is ruled out for myocardial infarction  would recommend a dobutamine Cardiolite to assess for inducible  ischemia.  We will continue her on Plavix (no need for aspirin), as well  as her other medications and replace her potassium.  We will hold her  metformin and place her on a sliding scale insulin.  We will continue  her lovastatin.  We will also continue her temazepam, clonazepam, Protonix and Atrovent  nebulizers p.r.n.  We will place her on Lovenox 30 mg subcutaneously  daily for a deep venous thrombosis prophylaxis.  Of note, patient did  have a CT angiography of her chest in October of 2007, which was  negative for any aneurysm or dissection.      Brayton El, MD   Electronically Signed     ______________________________  Bevelyn Buckles. Bensimhon, MD    SGA/MEDQ  D:  10/20/2006  T:  10/20/2006  Job:  [id number]

## 2011-03-06 ENCOUNTER — Ambulatory Visit (INDEPENDENT_AMBULATORY_CARE_PROVIDER_SITE_OTHER): Payer: Medicare Other | Admitting: Internal Medicine

## 2011-03-06 ENCOUNTER — Encounter: Payer: Self-pay | Admitting: Internal Medicine

## 2011-03-06 DIAGNOSIS — D518 Other vitamin B12 deficiency anemias: Secondary | ICD-10-CM

## 2011-03-06 DIAGNOSIS — L570 Actinic keratosis: Secondary | ICD-10-CM

## 2011-03-06 DIAGNOSIS — M542 Cervicalgia: Secondary | ICD-10-CM

## 2011-03-06 DIAGNOSIS — E119 Type 2 diabetes mellitus without complications: Secondary | ICD-10-CM

## 2011-03-06 DIAGNOSIS — R569 Unspecified convulsions: Secondary | ICD-10-CM

## 2011-03-06 DIAGNOSIS — E538 Deficiency of other specified B group vitamins: Secondary | ICD-10-CM

## 2011-03-06 DIAGNOSIS — M25539 Pain in unspecified wrist: Secondary | ICD-10-CM

## 2011-03-06 DIAGNOSIS — Z23 Encounter for immunization: Secondary | ICD-10-CM

## 2011-03-06 MED ORDER — POTASSIUM CHLORIDE 10 MEQ PO TBCR: 20.0000 meq | EXTENDED_RELEASE_TABLET | Freq: Every day | ORAL | Status: DC

## 2011-03-06 MED ORDER — GLIMEPIRIDE 1 MG PO TABS: 1.0000 mg | ORAL_TABLET | Freq: Every day | ORAL | Status: DC

## 2011-03-06 MED ORDER — CLONAZEPAM 0.5 MG PO TABS: 1.0000 mg | ORAL_TABLET | Freq: Every day | ORAL | Status: DC

## 2011-03-06 MED ORDER — PHENYTOIN SODIUM EXTENDED 100 MG PO CAPS: 200.0000 mg | ORAL_CAPSULE | Freq: Every day | ORAL | Status: DC

## 2011-03-06 MED ORDER — ESCITALOPRAM OXALATE 20 MG PO TABS: 20.0000 mg | ORAL_TABLET | Freq: Every day | ORAL | Status: DC

## 2011-03-06 MED ORDER — CLOPIDOGREL BISULFATE 75 MG PO TABS: 75.0000 mg | ORAL_TABLET | Freq: Every day | ORAL | Status: DC

## 2011-03-06 MED ORDER — IBUPROFEN 600 MG PO TABS: 600.0000 mg | ORAL_TABLET | Freq: Four times a day (QID) | ORAL | Status: DC | PRN

## 2011-03-06 NOTE — Assessment & Plan Note (Signed)
On Rx 

## 2011-03-06 NOTE — Assessment & Plan Note (Addendum)
Procedure Note :     Procedure : Cryosurgery   Indication:  Wart(s)  Actinic keratosis(es) R shoulder   Risks including unsuccessful procedure , bleeding, infection, bruising, scar, a need for a repeat  procedure and others were explained to the patient in detail as well as the benefits. Informed consent was obtained verbally.    1 lesion(s)  on R shoulder   was/were treated with liquid nitrogen on a Q-tip in a usual fasion . Band-Aid was applied and antibiotic ointment was given for a later use.   Tolerated well. Complications none.   Postprocedure instructions :     Keep the wounds clean. You can wash them with liquid soap and water. Pat dry with gauze or a Kleenex tissue  Before applying antibiotic ointment and a Band-Aid.   You need to report immediately  if  any signs of infection develop.

## 2011-03-06 NOTE — Assessment & Plan Note (Signed)
Abd pol long tendonitis-- Inj advised - she declined Lidoderm

## 2011-03-07 ENCOUNTER — Encounter: Payer: Self-pay | Admitting: Family Medicine

## 2011-03-07 ENCOUNTER — Encounter: Payer: Self-pay | Admitting: Internal Medicine

## 2011-03-07 ENCOUNTER — Ambulatory Visit (INDEPENDENT_AMBULATORY_CARE_PROVIDER_SITE_OTHER): Payer: Medicare Other | Admitting: Family Medicine

## 2011-03-07 ENCOUNTER — Inpatient Hospital Stay (HOSPITAL_COMMUNITY)
Admission: EM | Admit: 2011-03-07 | Discharge: 2011-03-10 | DRG: 603 | Disposition: A | Discharge status: Home / Self Care | Payer: Medicare Other | Attending: Family Medicine | Admitting: Family Medicine

## 2011-03-07 VITALS — BP 132/80 | Temp 97.6°F | Wt 143.0 lb

## 2011-03-07 DIAGNOSIS — I4729 Other ventricular tachycardia: Secondary | ICD-10-CM | POA: Diagnosis not present

## 2011-03-07 DIAGNOSIS — D696 Thrombocytopenia, unspecified: Secondary | ICD-10-CM | POA: Diagnosis present

## 2011-03-07 DIAGNOSIS — J4489 Other specified chronic obstructive pulmonary disease: Secondary | ICD-10-CM | POA: Diagnosis present

## 2011-03-07 DIAGNOSIS — Z7902 Long term (current) use of antithrombotics/antiplatelets: Secondary | ICD-10-CM

## 2011-03-07 DIAGNOSIS — Z79899 Other long term (current) drug therapy: Secondary | ICD-10-CM

## 2011-03-07 DIAGNOSIS — E119 Type 2 diabetes mellitus without complications: Secondary | ICD-10-CM | POA: Diagnosis present

## 2011-03-07 DIAGNOSIS — J441 Chronic obstructive pulmonary disease with (acute) exacerbation: Secondary | ICD-10-CM

## 2011-03-07 DIAGNOSIS — F410 Panic disorder [episodic paroxysmal anxiety] without agoraphobia: Secondary | ICD-10-CM | POA: Diagnosis present

## 2011-03-07 DIAGNOSIS — I472 Ventricular tachycardia, unspecified: Secondary | ICD-10-CM | POA: Diagnosis not present

## 2011-03-07 DIAGNOSIS — J449 Chronic obstructive pulmonary disease, unspecified: Secondary | ICD-10-CM | POA: Diagnosis present

## 2011-03-07 DIAGNOSIS — Z85038 Personal history of other malignant neoplasm of large intestine: Secondary | ICD-10-CM

## 2011-03-07 DIAGNOSIS — F17200 Nicotine dependence, unspecified, uncomplicated: Secondary | ICD-10-CM | POA: Diagnosis present

## 2011-03-07 DIAGNOSIS — T50A95A Adverse effect of other bacterial vaccines, initial encounter: Secondary | ICD-10-CM

## 2011-03-07 DIAGNOSIS — R0602 Shortness of breath: Secondary | ICD-10-CM | POA: Diagnosis not present

## 2011-03-07 DIAGNOSIS — M549 Dorsalgia, unspecified: Secondary | ICD-10-CM | POA: Diagnosis present

## 2011-03-07 DIAGNOSIS — I700 Atherosclerosis of aorta: Secondary | ICD-10-CM | POA: Diagnosis present

## 2011-03-07 DIAGNOSIS — G40909 Epilepsy, unspecified, not intractable, without status epilepticus: Secondary | ICD-10-CM | POA: Diagnosis present

## 2011-03-07 DIAGNOSIS — K7460 Unspecified cirrhosis of liver: Secondary | ICD-10-CM | POA: Diagnosis present

## 2011-03-07 DIAGNOSIS — R599 Enlarged lymph nodes, unspecified: Secondary | ICD-10-CM | POA: Diagnosis present

## 2011-03-07 DIAGNOSIS — IMO0002 Reserved for concepts with insufficient information to code with codable children: Principal | ICD-10-CM | POA: Diagnosis present

## 2011-03-07 LAB — POCT I-STAT, CHEM 8
Creatinine, Ser: 0.9 mg/dL (ref 0.4–1.2)
HCT: 46 % (ref 36.0–46.0)
Hemoglobin: 15.6 g/dL — ABNORMAL HIGH (ref 12.0–15.0)
Potassium: 4.1 mEq/L (ref 3.5–5.1)
Sodium: 136 mEq/L (ref 135–145)
TCO2: 21 mmol/L (ref 0–100)

## 2011-03-07 LAB — CBC
HCT: 42.3 % (ref 36.0–46.0)
Hemoglobin: 14.9 g/dL (ref 12.0–15.0)
WBC: 16 10*3/uL — ABNORMAL HIGH (ref 4.0–10.5)

## 2011-03-07 LAB — DIFFERENTIAL
Basophils Absolute: 0 10*3/uL (ref 0.0–0.1)
Lymphocytes Relative: 9 % — ABNORMAL LOW (ref 12–46)
Monocytes Absolute: 0.5 10*3/uL (ref 0.1–1.0)
Neutro Abs: 14 10*3/uL — ABNORMAL HIGH (ref 1.7–7.7)

## 2011-03-07 MED ORDER — DOXYCYCLINE HYCLATE 100 MG PO CAPS: 100.0000 mg | ORAL_CAPSULE | Freq: Two times a day (BID) | ORAL | Status: AC

## 2011-03-07 MED ORDER — PREDNISONE 20 MG PO TABS: ORAL_TABLET | ORAL | Status: DC

## 2011-03-07 NOTE — Assessment & Plan Note (Signed)
Reported this to manufacturer. She is to use ice packs on the LUE 15 min on and off today and tomorrow.  OK to use Motrin for pain.

## 2011-03-07 NOTE — Assessment & Plan Note (Signed)
COPD exac after PNX yesterday with increased SOB and wheezing.  Will treat with Prednisone burst 60 mg/ day x 5 days and Doxyxcline 100 mg bid x 10 days.  She is to use her Nebs 4 x a day.  Told her daughter that if she became more SOB, to go to the ED.  She agrees.  Then prednsione may also help the swelling and redness from her PNX reaction.

## 2011-03-07 NOTE — Patient Instructions (Signed)
For reaction/ COPD exacerbation:  Take Doxycycline 2 x a day for 10 days.  Prednisone 60 mg once a day for 5 days (3 tabs at a time).  Use ice packs and motrin for pain and swelling.  Use nebulizer treatments 4 x a day for the next 5-7 days.  Return to see Dr Posey Rea this week.

## 2011-03-07 NOTE — Progress Notes (Signed)
  Subjective:    Patient ID: Monique Fleming, female    DOB: 1929/07/14, 77 y.o.   MRN: [id number]  HPI  77 yo WF presents for a reaction to her pneumovax.  She received her immunization yesterday afternoon.  She felt more SOB in the late afternoon.  She started to have redness, swelling and pain over the Left upper arm.  It is worse today.  She has COPD also.  Has nebs at home but has not been using them.  Denies fevers, chills or mental status changes.  Has some chest tightness.  BP 132/80  Temp(Src) 97.6 F (36.4 C) (Oral)  Wt 143 lb (64.864 kg)  SpO2 96%     Review of Systems  Respiratory: Positive for cough, chest tightness, shortness of breath and wheezing.   Cardiovascular: Negative for chest pain, palpitations and leg swelling.  Gastrointestinal: Negative for nausea and diarrhea.  Skin: Positive for color change.  Neurological: Negative for weakness.       Objective:   Physical Exam  Constitutional: She appears well-developed and well-nourished. No distress.       Here with her daughter  Eyes: Conjunctivae are normal. No scleral icterus.  Neck: Neck supple.  Cardiovascular: Normal rate, regular rhythm and normal heart sounds.   Pulmonary/Chest: Effort normal. No respiratory distress. She has wheezes. She has no rales. She exhibits no tenderness.  Musculoskeletal: She exhibits edema (soft tissue edema left upper arm) and tenderness.  Lymphadenopathy:    She has no cervical adenopathy.  Skin: There is erythema (left upper arm.  ).  Psychiatric: She has a normal mood and affect.          Assessment & Plan:

## 2011-03-07 NOTE — Progress Notes (Signed)
  Subjective:    Patient ID: Monique Fleming, female    DOB: August 28, 1929, 77 y.o.   MRN: [id number]  HPI The patient presents for a follow-up of  chronic hypertension, chronic dyslipidemia, type 2 diabetes controlled with medicines  F/u seizures  C/o R wrist pain - severe at times   Review of Systems  Constitutional: Negative.  Negative for chills, appetite change and fatigue.  HENT: Positive for congestion. Negative for hearing loss, ear pain, sore throat, facial swelling, rhinorrhea, trouble swallowing, neck pain, neck stiffness and tinnitus.   Eyes: Negative for pain, discharge, redness, itching and visual disturbance.  Respiratory: Positive for shortness of breath. Negative for cough, chest tightness, wheezing and stridor.   Cardiovascular: Negative for chest pain, palpitations and leg swelling.  Gastrointestinal: Negative for nausea, diarrhea, constipation, blood in stool, abdominal distention, anal bleeding and rectal pain.  Genitourinary: Positive for frequency. Negative for dysuria, urgency, hematuria, flank pain, vaginal bleeding, vaginal discharge, difficulty urinating, genital sores and pelvic pain.  Musculoskeletal: Negative for back pain, joint swelling, arthralgias and gait problem.  Skin: Negative.  Negative for rash.  Neurological: Negative for dizziness, tremors, seizures, syncope, speech difficulty, weakness, numbness and headaches.  Hematological: Negative for adenopathy. Does not bruise/bleed easily.  Psychiatric/Behavioral: Negative for suicidal ideas, behavioral problems, sleep disturbance, dysphoric mood and decreased concentration. The patient is nervous/anxious.        Objective:   Physical Exam  Constitutional: She appears well-developed and well-nourished. No distress.  HENT:  Head: Normocephalic.  Right Ear: External ear normal.  Left Ear: External ear normal.  Nose: Nose normal.  Mouth/Throat: Oropharynx is clear and moist.  Eyes: Conjunctivae are  normal. Pupils are equal, round, and reactive to light. Right eye exhibits no discharge. Left eye exhibits no discharge.  Neck: Normal range of motion. Neck supple. No JVD present. No tracheal deviation present. No thyromegaly present.  Cardiovascular: Normal rate, regular rhythm and normal heart sounds.   Pulmonary/Chest: No stridor. No respiratory distress. She has no wheezes.  Abdominal: Soft. Bowel sounds are normal. She exhibits no distension and no mass. There is no tenderness. There is no rebound and no guarding.  Musculoskeletal: She exhibits tenderness (Abd pol longus tendon is tender). She exhibits no edema.  Lymphadenopathy:    She has no cervical adenopathy.  Neurological: She displays normal reflexes. No cranial nerve deficit. She exhibits normal muscle tone. Coordination normal.  Skin: No rash noted. No erythema.  Psychiatric: She has a normal mood and affect. Her behavior is normal. Judgment and thought content normal.          Assessment & Plan:

## 2011-03-08 ENCOUNTER — Inpatient Hospital Stay (HOSPITAL_COMMUNITY): Payer: Medicare Other

## 2011-03-08 LAB — DIFFERENTIAL
Lymphocytes Relative: 10 % — ABNORMAL LOW (ref 12–46)
Lymphs Abs: 1.4 10*3/uL (ref 0.7–4.0)
Neutro Abs: 12.3 10*3/uL — ABNORMAL HIGH (ref 1.7–7.7)
Neutrophils Relative %: 85 % — ABNORMAL HIGH (ref 43–77)

## 2011-03-08 LAB — TROPONIN I: Troponin I: <0.3 ng/mL (ref <0.30)

## 2011-03-08 LAB — CBC
HCT: 36.8 % (ref 36.0–46.0)
MCV: 84.6 fL (ref 78.0–100.0)
Platelets: 120 10*3/uL — ABNORMAL LOW (ref 150–400)
RBC: 4.35 MIL/uL (ref 3.87–5.11)
WBC: 14.5 10*3/uL — ABNORMAL HIGH (ref 4.0–10.5)

## 2011-03-08 LAB — BASIC METABOLIC PANEL
CO2: 22 mEq/L (ref 19–32)
GFR calc non Af Amer: >60 mL/min (ref >60)
Glucose, Bld: 207 mg/dL — ABNORMAL HIGH (ref 70–99)
Potassium: 3.7 mEq/L (ref 3.5–5.1)
Sodium: 135 mEq/L (ref 135–145)

## 2011-03-08 LAB — GLUCOSE, CAPILLARY: Glucose-Capillary: 260 mg/dL — ABNORMAL HIGH (ref 70–99)

## 2011-03-08 LAB — CK TOTAL AND CKMB (NOT AT ARMC): Total CK: 267 U/L — ABNORMAL HIGH (ref 7–177)

## 2011-03-09 ENCOUNTER — Inpatient Hospital Stay (HOSPITAL_COMMUNITY): Payer: Medicare Other

## 2011-03-09 ENCOUNTER — Encounter (HOSPITAL_COMMUNITY): Payer: Self-pay | Admitting: Radiology

## 2011-03-09 ENCOUNTER — Telehealth: Payer: Self-pay

## 2011-03-09 ENCOUNTER — Telehealth: Payer: Self-pay | Admitting: Internal Medicine

## 2011-03-09 DIAGNOSIS — R0602 Shortness of breath: Secondary | ICD-10-CM

## 2011-03-09 LAB — BASIC METABOLIC PANEL
Calcium: 8.3 mg/dL — ABNORMAL LOW (ref 8.4–10.5)
Chloride: 109 mEq/L (ref 96–112)
Creatinine, Ser: 0.71 mg/dL (ref 0.4–1.2)
GFR calc Af Amer: >60 mL/min (ref >60)
GFR calc non Af Amer: >60 mL/min (ref >60)

## 2011-03-09 LAB — GLUCOSE, CAPILLARY
Glucose-Capillary: 63 mg/dL — ABNORMAL LOW (ref 70–99)
Glucose-Capillary: 93 mg/dL (ref 70–99)

## 2011-03-09 LAB — CK TOTAL AND CKMB (NOT AT ARMC)
CK, MB: 7.9 ng/mL (ref 0.3–4.0)
Relative Index: 2.6 — ABNORMAL HIGH (ref 0.0–2.5)
Relative Index: 3.2 — ABNORMAL HIGH (ref 0.0–2.5)
Total CK: 250 U/L — ABNORMAL HIGH (ref 7–177)

## 2011-03-09 LAB — CBC
Hemoglobin: 11.6 g/dL — ABNORMAL LOW (ref 12.0–15.0)
MCH: 28.6 pg (ref 26.0–34.0)
RBC: 4.05 MIL/uL (ref 3.87–5.11)

## 2011-03-09 LAB — MAGNESIUM: Magnesium: 1.4 mg/dL — ABNORMAL LOW (ref 1.5–2.5)

## 2011-03-09 LAB — TROPONIN I: Troponin I: <0.3 ng/mL (ref <0.30)

## 2011-03-09 MED ORDER — IOHEXOL 300 MG/ML  SOLN
80.0000 mL | Freq: Once | INTRAMUSCULAR | Status: AC | PRN
  Administered 2011-03-09: 80 mL via INTRAVENOUS

## 2011-03-09 NOTE — Telephone Encounter (Signed)
Will forward to CDY so that he is aware. Unable to call daughter back since no number was taken.

## 2011-03-09 NOTE — Telephone Encounter (Signed)
[facility] Triage Call Report Triage Record Num: [id number] Operator: Arline Asp Loftin Patient Name: Monique Fleming Call Date & Time: 03/07/2011 4:48:38PM Patient Phone: [phone] PCP: Sonda Primes Patient Gender: Female PCP Fax : [phone] Patient DOB: 1929-03-08 [facility] Name: [facility] Reason for Call: [name], Other, calling regarding Other. PCP is Plotnikov, Alex. Callback number is [phone]. Pt seen in office 06/02 at 1030. Started Prednisone/Doxycycline for arm swelling. (First dose at 1115.) Since appt. "the swelling has extended 2 more inches down the arm in the last hour "since 1600," and the swelling now goes 2 inches below the elbow on her forearm." Advised per Dr. Cathey Endow if "same or worse tomorrow she will need to go to ED, and if better she still needs to follow up with Dr Nada Boozer on Monday." Area is now [employer]. "Breathing is the same as when she was seen." Afebrile. Emergent sx negative. Care advice per "Skin Lesions Adult Protocol" with appt advised within 4h due to "any skin lesion with s/s of worsening infection, not improving with treatment." RN override to call provider immediately as she was seen earlier today for sx, but have now worsened. Per Seymour Bars MD, "as long as no fever or SOB" okay to continue home treatment. Needs to go to ED if severe pain, fever. Also encouraged to use ice pack. Caller advised of all. Protocol(s) Used: Skin Lesions Recommended Outcome per Protocol: See Provider within 4 hours Reason for Outcome: Any skin lesion with signs and symptoms of worsening infection (quickly getting larger, becoming more painful, purulent drainage, new fever not improving with treatment) Care Advice: ~ 03/07/2011 5:11:36PM Page 1 of 1 [email or web address]_[email or web address]

## 2011-03-09 NOTE — Telephone Encounter (Signed)
[facility] Triage Call Report Triage Record Num: [id number] Operator: Reino Bellis Patient Name: Monique Fleming Call Date & Time: 03/07/2011 9:01:53AM Patient Phone: [phone] PCP: Sonda Primes Patient Gender: Female PCP Fax : [phone] Patient DOB: [birth year] [facility] Name[facility] Reason for Call: Stann Ore, Other, calling regarding Other. PCP is Plotnikov, Alex. Callback number is [phone]. Daughter/Monique Fleming calling d/t left arm being red, swollen, warm to touch per pt., and tender. Onset: 03/06/11 following pneumonia shot but getting worse per daughter. Afebrile. Taken Motrin and using ice to area but more painful and increase swelling noted this AM. Any skin lesion with signs and symptoms of worsening infection (quickly getting larger, becoming more painful, purulent drainage, new fever not improving with treatment) positive per Skin Lesions Protocol. Advised pt. should see MD within 4 hours. Spoke with Erie Noe at office and told to have pt. come in by 10 or 1030. [name] (daughter made aware) and care advice given. Protocol(s) Used: Skin Lesions Recommended Outcome per Protocol: See Provider within 4 hours Reason for Outcome: Any skin lesion with signs and symptoms of worsening infection (quickly getting larger, becoming more painful, purulent drainage, new fever not improving with treatment) Care Advice: ~ Keep any pressure off of the affected area, be careful to avoid injury to the area. ~ SYMPTOM / CONDITION MANAGEMENT ~ CAUTIONS Analgesic/Antipyretic Advice - Acetaminophen: Consider acetaminophen as directed on label or by pharmacist/provider for pain or fever PRECAUTIONS: - Use if there is no history of liver disease, alcoholism, or intake of three or more alcohol drinks per day - Only if approved by provider during pregnancy or when breastfeeding - During pregnancy, acetaminophen should not be taken more than 3 consecutive days without telling  provider - Do not exceed recommended dose or frequency ~ Analgesic/Antipyretic Advice - NSAIDs: Consider aspirin, ibuprofen, naproxen or ketoprofen for pain or fever as directed on label or by pharmacist/provider. PRECAUTIONS: - If over 75 years of age, should not take longer than 1 week without consulting provider. EXCEPTIONS: - Should not be used if taking blood thinners or have bleeding problems. - Do not use if have history of sensitivity/allergy to any of these medications; or history of cardiovascular, ulcer, kidney, liver disease or diabetes unless approved by provider. - Do not exceed recommended dose or frequency. ~ Controlling or Preventing Skin Infections: - Carefully wash hands briskly using warm water and soap for at least 15 seconds (sing Happy Birthday) before and after touching affected area or you can use a 62% alcohol-based hand sanitizer. - Cover any lesion with a clean, dry bandage until healed. Dispose of old bandages in a plastic bag and throw out with regular trash. ~ 03/07/2011 9:19:34AM Page 1 of 2 [email or web address]_[email or web address]_[email or web address] [facility] Triage Call Report Patient Name: Monique Fleming continuation page/s - Avoid close contact with others until wound is healed. - Do not share personal items like towels, razors or combs. - Shower after working out in a gym or after [employer]. Avoid sharing athletic equipment or wipe down with antibacterial wipes before use. - Wash towels and bed linens in hot water and bleach. Dry them in a hot dryer. - Wash clothes after each wearing, especially athletic or gym clothes. - If your provider tells you that have a staph or MRSA skin infection, be sure to tell other providers that may be caring for you. - If you are given an antibiotic, take ALL of the doses, even if the infection is getting better. 06/

## 2011-03-09 NOTE — Telephone Encounter (Signed)
[facility] Triage Call Report Triage Record Num: [id number] Operator: Albertine Grates Patient Name: Monique Fleming Call Date & Time: 03/06/2011 11:07:28PM Patient Phone: [phone] PCP: Sonda Primes Patient Gender: Female PCP Fax : [phone] Patient DOB: 07-09-1929 [facility] Name: [facility] Reason for Call: Daughter/[name] calling and states received pneumonia injection 6-1 and arm is red, swollen 6-1.Is swollen 4 inches around arm. Is painful. Has taken MOtrin. Is using ice to area. Home care advice given per Skin Lesion protocol. Protocol(s) Used: Skin Lesions Recommended Outcome per Protocol: Provide Home/Self Care Reason for Outcome: Localized soreness/tenderness, swelling or discoloration of skin at injection site (including immunizations) Care Advice: Vaccination / Immunization Advice: - Side effects of vaccination / immunization vary with each type. (See [employer] topic "Immunizations - Adult" for specific information.) - Common mild side effects of a vaccination / immunization may include mild redness, general swelling, soreness or a bruise at the injection site, a mild or moderate fever up to 101.5 F (38.6 C), headache, muscle aches, joint aches or tiredness. These symptoms may last 1 to 3 days. - Call provider if symptoms do not improve with 3 days of home care. - Call EMS 911 if having signs and symptoms of a severe allergic reaction (such as severe difficulty breathing, throat tightness, chest pain, sudden swelling of face, lips or tongue, etc.). Life-threatening reactions from vaccines are very rare. ~ 03/06/2011 11:14:41PM Page 1 of 1 CAN_TriageRpt_V2

## 2011-03-09 NOTE — Telephone Encounter (Signed)
I called Ms Churchman - left a VM

## 2011-03-10 LAB — CBC
HCT: 39.2 % (ref 36.0–46.0)
Hemoglobin: 13.5 g/dL (ref 12.0–15.0)
MCH: 29.4 pg (ref 26.0–34.0)
MCHC: 34.4 g/dL (ref 30.0–36.0)
MCV: 85.4 fL (ref 78.0–100.0)
RDW: 14.9 % (ref 11.5–15.5)

## 2011-03-13 ENCOUNTER — Encounter: Payer: Self-pay | Admitting: Internal Medicine

## 2011-03-13 ENCOUNTER — Ambulatory Visit (INDEPENDENT_AMBULATORY_CARE_PROVIDER_SITE_OTHER): Payer: Medicare Other | Admitting: Internal Medicine

## 2011-03-13 DIAGNOSIS — T50A95A Adverse effect of other bacterial vaccines, initial encounter: Secondary | ICD-10-CM

## 2011-03-13 DIAGNOSIS — E119 Type 2 diabetes mellitus without complications: Secondary | ICD-10-CM

## 2011-03-13 DIAGNOSIS — F411 Generalized anxiety disorder: Secondary | ICD-10-CM

## 2011-03-13 NOTE — Progress Notes (Signed)
  Subjective:    Patient ID: Monique Fleming, female    DOB: 11/05/1928, 77 y.o.   MRN: [id number]  HPI  The patient presents for a follow-up of L arm swelling and SOB due to pneumovax injection prior.     Review of Systems  Constitutional: Negative.  Negative for chills, appetite change and fatigue.  HENT: Positive for congestion. Negative for hearing loss, ear pain, sore throat, facial swelling, rhinorrhea, trouble swallowing, neck pain, neck stiffness and tinnitus.   Eyes: Negative for pain, discharge, redness, itching and visual disturbance.  Respiratory: Positive for shortness of breath. Negative for cough, chest tightness, wheezing and stridor.   Cardiovascular: Negative for chest pain, palpitations and leg swelling.  Gastrointestinal: Negative for nausea, diarrhea, constipation, blood in stool, abdominal distention, anal bleeding and rectal pain.  Genitourinary: Positive for frequency. Negative for dysuria, urgency, hematuria, flank pain, vaginal bleeding, vaginal discharge, difficulty urinating, genital sores and pelvic pain.  Musculoskeletal: Negative for back pain, joint swelling, arthralgias and gait problem.  Skin: Negative.  Negative for rash.  Neurological: Negative for dizziness, tremors, seizures, syncope, speech difficulty, weakness, numbness and headaches.  Hematological: Negative for adenopathy. Does not bruise/bleed easily.  Psychiatric/Behavioral: Negative for suicidal ideas, behavioral problems, sleep disturbance, dysphoric mood and decreased concentration. The patient is nervous/anxious.        Objective:   Physical Exam  Constitutional: She appears well-developed and well-nourished. No distress.  HENT:  Head: Normocephalic.  Right Ear: External ear normal.  Left Ear: External ear normal.  Nose: Nose normal.  Mouth/Throat: Oropharynx is clear and moist.  Eyes: Conjunctivae are normal. Pupils are equal, round, and reactive to light. Right eye exhibits no  discharge. Left eye exhibits no discharge.  Neck: Normal range of motion. Neck supple. No JVD present. No tracheal deviation present. No thyromegaly present.  Cardiovascular: Normal rate, regular rhythm and normal heart sounds.   Pulmonary/Chest: No stridor. No respiratory distress. She has no wheezes.  Abdominal: Soft. Bowel sounds are normal. She exhibits no distension and no mass. There is no tenderness. There is no rebound and no guarding.  Musculoskeletal: She exhibits tenderness (Abd pol longus tendon is tender). She exhibits no edema.  Lymphadenopathy:    She has no cervical adenopathy.  Neurological: She displays normal reflexes. No cranial nerve deficit. She exhibits normal muscle tone. Coordination normal.  Skin: No rash noted. No erythema.  Psychiatric: She has a normal mood and affect. Her behavior is normal. Judgment and thought content normal.          Assessment & Plan:  Hospital records and tests were reviewed

## 2011-03-13 NOTE — Assessment & Plan Note (Signed)
Worse on steroids. Stop steroids.

## 2011-03-13 NOTE — Assessment & Plan Note (Signed)
Discussed On Rx 

## 2011-03-13 NOTE — Assessment & Plan Note (Signed)
Mark in allergies

## 2011-03-13 NOTE — Patient Instructions (Signed)
Stop Prednisone today Stop Doxycycline tomorrow

## 2011-03-14 LAB — CULTURE, BLOOD (ROUTINE X 2): Culture  Setup Time: 201206031127

## 2011-03-14 NOTE — H&P (Signed)
NAME:  CAPRICIA, SERDA NO.:  [id number]  MEDICAL RECORD NO.:  [id number]           PATIENT TYPE:  I  LOCATION:  1507                         FACILITY:  [facility]  PHYSICIAN:  Della Goo, M.D. DATE OF BIRTH:  1929/03/13  DATE OF ADMISSION:  03/07/2011 DATE OF DISCHARGE:                             HISTORY & PHYSICAL   DATE OF ADMISSION:  March 07, 2011.  PRIMARY CARE PHYSICIAN:  Jacinta Shoe, MD  CHIEF COMPLAINT:  Redness, left arm.  HISTORY OF PRESENT ILLNESS:  This is a 77 year old female who presents to the emergency department secondary to worsening redness of her left arm after receiving a Pneumovax vaccination in her primary care physician's office 1 day ago.  The redness did not begin at the actual site.  The redness  started at the top of the upper arm and went down to the elbow area in the course of less than 24 hours.  The patient went for evaluation of the reddened area the next day and was seen in the office by one of the PAs and was placed on antibiotic therapy of doxycycline along with prednisone therapy.  The area was demarcated and the patient and her daughter were told if the redness worsens beyond the line that had been drawn at the outer edge, that they should go to the emergency department for further evaluation.  Over the course of the afternoon, the redness continued to spread beyond the demarcated border and the patient was brought to the emergency department for further evaluation and treatment.  The patient's daughter reports that Mrs. Shively has been talking nonstop all day and has been agitated and has moments of panic.  Most likely this is secondary to the prednisone therapy.  PAST MEDICAL HISTORY:  Significant for COPD, remote history of colon cancer, history of type 2 diabetes mellitus, seizure disorder.  MEDICATIONS:  Include Amaryl, Deltasone, Dilantin, Kenalog, Klonopin, Klor-Con, Lexapro, Motrin, Neurontin, Nexium,  Plavix, doxycycline.  ALLERGIES:  AUGMENTIN, LEVAQUIN, and SUDAFED.  SOCIAL HISTORY:  The patient is a smoker.  She smokes five cigarettes daily.  She has not smoked in the past 2 days.  She is a nondrinker and she denies any illicit drug usage.  FAMILY HISTORY:  Noncontributory.  REVIEW OF SYSTEMS:  Pertinent as mentioned above.  PHYSICAL EXAMINATION FINDINGS:  GENERAL:  This is a 77 year old, thin, elderly Caucasian female who is in no acute distress. VITAL SIGNS:  Temperature 97.6, blood pressure 150/76, heart rate 81, respirations 20, O2 saturation 94%. HEENT:  Normocephalic, atraumatic.  Pupils equally round, reactive to light.  Extraocular movements are intact.  Funduscopic: Benign.  Nares are patent bilaterally.  Oropharynx is clear. NECK:  Supple, full range of motion.  No thyromegaly, adenopathy or jugular venous distention. CARDIOVASCULAR:  Regular rate and rhythm.  No murmurs, gallops or rubs. LUNGS:  Clear to auscultation bilaterally.  No rales, rhonchi or wheezes. ABDOMEN:  Positive bowel sounds.  Abdomen is soft, nontender, nondistended.  No hepatosplenomegaly. EXTREMITIES: Without cyanosis, clubbing or edema. NEUROLOGIC:  Able to move all four extremities.  No focal motor deficits.  LABORATORY STUDIES:  White blood cell count 16.0, hemoglobin 14.9, hematocrit 43.3, MCV 83.8, platelets 144, neutrophils 87%, lymphocytes 9%.  ASSESSMENT:  77 year old female being admitted with: 1. Cellulitis of the left upper arm. 2. Type 2 diabetes mellitus. 3. Confusion secondary to medication most likely due to prednisone. 4. Seizure disorder.  PLAN:  The patient is admitted for continued IV antibiotic treatment for the cellulitis at this time.  The patient will also be placed on a steroid taper.  Her regular medications will be further reconciled and sliding scale insulin coverage will also be ordered.  The patient will be placed on Ativan IV p.r.n. for severe agitation as  well.     Della Goo, M.D.     HJ/MEDQ  D:  03/08/2011  T:  03/08/2011  Job:  [id number]  Electronically Signed by Della Goo M.D. on 03/14/2011 06:32:32 AM

## 2011-03-20 NOTE — Discharge Summary (Signed)
NAME:  Monique Fleming, Monique Fleming NO.:  [id number]  MEDICAL RECORD NO.:  [id number]  LOCATION:  1507                         FACILITY:  [facility]  PHYSICIAN:  Brendia Sacks, MD    DATE OF BIRTH:  05-05-1929  DATE OF ADMISSION:  03/07/2011 DATE OF DISCHARGE:  03/10/2011                              DISCHARGE SUMMARY   CONDITION ON DISCHARGE:  Improved.  DISPOSITION:  Home.  PRIMARY CARE PHYSICIAN:  Georgina Quint. Plotnikov, MD.  PRIMARY PULMONOLOGIST:  Clinton D. Maple Hudson, MD, FCCP, FACP.  PRIMARY CARDIOLOGIST:  Noralyn Pick. Eden Emms, MD, [facility]  DISCHARGE DIAGNOSES: 1. Left upper extremity edema and erythema, presumed cellulitis,     possible allergic reaction. 2. Diabetes mellitus type 2, not uncontrolled. 3. Thrombocytopenia, resolved. 4. Anxiety with panic attacks and musculoskeletal back pain, stable. 5. Enlargement of mediastinal lymph nodes, recommend outpatient     followup as clinically indicated. 6. Increased soft tissue along the left side of the distal esophagus,     recommend outpatient evaluation as clinically indicated. 7. Extensive atherosclerotic disease involving the thoracic aorta,     follow up clinically.  HISTORY OF PRESENT ILLNESS:  This is a 77 year old woman who presented to the emergency room with redness of her left arm.  The patient received a Pneumovax injection and within 24 hours, did develop swelling and erythema and pain.  She was seen in her primary care physician's office, started on prednisone, doxycycline, but she rapidly became worse and was hospitalized.  She had had two doses of doxycycline before being hospitalized.  HOSPITAL COURSE: 1. Left upper extremity edema and erythema, presumed cellulitis.  The     patient was admitted and treated both with steroids for possible     allergic reaction as well as IV antibiotics for presumed infection.     She has improved over the last several days with IV vancomycin and     her erythema and edema  is almost resolved.  She has full flexion     and extension at the elbow joint and no pain on palpation.  She is     stable for discharge.  She will resume doxycycline in the     outpatient setting, as she had only had two doses prior to     admission and this would not constitute outpatient failure.  She     has multiple other allergies to antibiotics.  She also completed a     total approximately 10-day course of steroids. 2. Anxiety with panic attacks and musculoskeletal back pain.  The     patient had several episodes of coughing fits and shortness of     breath associated with these.  One attack was associated with     transient ventricular tachycardia.  However, further coughing since     demonstrated no arrhythmias.  She feels and her daughters feel that     this was related to anxiety.  She has had no further significant     episodes.  No further events on telemetry and is stable for     discharge.  Because of severe shortness of breath with exertion, CT     angiogram of  chest was obtained which was negative for pulmonary     embolism and bilateral lower extremity Dopplers were checked which     were negative for DVT.  Given her negative evaluation and history,     this was suggestive of anxiety. 3. Thrombocytopenia.  This was presumably secondary to infection and     has resolved. 4. Enlargement of mediastinal lymph nodes.  Etiology and significance     is unclear.  Recommend monitoring in the outpatient setting.     Consider further followup with Dr. Jetty Duhamel.  In regard to the     increased soft tissue along the distal esophagus, could consider GI     referral in the outpatient setting.  The patient does have a     history of known cirrhosis.  This may represent varices. 5. Extensive atherosclerotic disease involving the thoracic aorta.     This has been seen in the past.  Discussed the case with Dr. Richarda Overlie, the reading radiologist.  The small penetrating ulcers  are     not felt to warrant further inpatient evaluation and can be     followed with repeat examinations in the outpatient setting.  CONSULTATIONS:  None.  PROCEDURES:  None.  IMAGING: 1. Chest x-ray, June 3:  Chronic underlying emphysematous changes and     bronchitic changes.  Probable superimposed interstitial edema is     read; however, by my review of this chest x-ray and previous chest     x-rays, no significant change is seen. 2. CT angiogram of chest, June 4:  Negative for acute pulmonary     embolism.  Enlargement of mediastinal lymph nodes.  In particular,     there is increased soft tissue along the left side of the distal     esophagus.  Difficult to exclude esophageal wall thickening or     esophageal varices.  Consider endoscopic ultrasound.  Extensive     atherosclerotic disease involving the thoracic aorta with regular     mural thrombus and small penetrating ulcers.  MICROBIOLOGY:  Blood cultures x2, no growth to date.  ANCILLARY STUDIES: 1. Preliminary bilateral lower extremity Dopplers were negative for     DVT. 2. EKG showed sinus rhythm with PACs.  No acute change is seen.  PERTINENT LABORATORY STUDIES: 1. CBC notable for white blood cell count of 14.7 on discharge.     Remainder of CBC is unremarkable.  Platelet count is 150,000.     Lowest platelet count seen was 115,000. 2. Capillary blood sugars stable over the last approximate 24 hours. 3. Serum magnesium was repleted. 4. Cardiac enzymes, troponins were negative.  CK and CK-MB mildly     elevated and consistent with localized reaction and intramuscular     injection.  DISCHARGE PHYSICAL EXAMINATION:  GENERAL:  The patient is feeling well. She has had no further anxiety attacks.  No further shortness of breath. She feels quite well.  No chest pain.  Arm is feeling much better. VITAL SIGNS:  Temperature is 98.2, pulse 68, respirations 18, blood pressure 166/71, saturation 92% on room  air. CARDIOVASCULAR:  Regular rate and rhythm.  No murmur, rub, or gallop. RESPIRATORY:  Clear to auscultation bilaterally.  No wheezes, rales or rhonchi.  Normal respiratory effort. EXTREMITIES:  Left upper extremity almost complete resolution of erythema.  Full flexion and extension at elbow, nontender to palpation. Edema nearly resolved.  DISCHARGE INSTRUCTIONS:  The patient will be discharged home  today. Diet is a heart-healthy, diabetic diet.  Activity as tolerated.  Follow up with Dr. Posey Rea in approximately 1 week.  DISCHARGE MEDICATIONS: 1. Doxycycline 100 mg p.o. b.i.d., complete outpatient prescription. 2. Prednisone 20 mg p.o. daily, take until June 10. 3. Vitamin D 1000 units p.o. daily. 4. Clonazepam 0.5 mg 2 tablets p.o. q.h.s. 5. Clopidogrel 35 mg p.o. daily. 6. Lexapro 20 mg p.o. daily at bedtime. 7. Nexium 40 mg p.o. daily before breakfast. 8. Iron 325 mg p.o. b.i.d. 9. Gabapentin 300 mg p.o. daily. 10.Amaryl 1 mg p.o. daily before breakfast. 11.Ibuprofen 600 mg every 6 hours as needed for pain. 12.Phenytoin extended release 100 mg 2 capsules p.o. q.h.s. 13.Potassium chloride 10 mEq p.o. 2 tablets daily. 14.Prevalite 1 packet p.o. b.i.d. as needed. 15.Symbicort 80/4.5 two puffs b.i.d.  Time coordinating discharge 25 minutes.     Brendia Sacks, MD     DG/MEDQ  D:  03/10/2011  T:  03/10/2011  Job:  [id number]  cc:   Georgina Quint. Plotnikov, MD [address] [place] [address]  Clinton D. Maple Hudson, MD, FCCP, FACP [facility] [address] [place] [address]  Noralyn Pick. Eden Emms, MD, [facility] [address] [place] [address]  Electronically Signed by Brendia Sacks  on 03/20/2011 95:62:13 PM

## 2011-03-24 NOTE — Telephone Encounter (Signed)
Seen by PCP since call.

## 2011-04-06 ENCOUNTER — Telehealth: Payer: Self-pay

## 2011-04-06 ENCOUNTER — Telehealth: Payer: Self-pay | Admitting: Internal Medicine

## 2011-04-06 NOTE — Telephone Encounter (Signed)
Pt states that she ate some pizza Sat. Night and she had bad indigestion. Pt states she did not eat much of anything yesterday.  Last night she had bad pains in her chest and burning. Today she has only eaten some dry toast. She is taking her Nexium every day. Pt wonders if she needs to take it twice a day. Dr. [name] please advise.

## 2011-04-06 NOTE — Telephone Encounter (Signed)
Attempted to return phone call several times, got fast busy signal.

## 2011-04-06 NOTE — Telephone Encounter (Signed)
[facility] Triage Call Report Triage Record Num: [id number] Operator: Audelia Hives Patient Name: Monique Fleming Call Date & Time: 04/04/2011 11:55:50AM Patient Phone: [phone] PCP: Sonda Primes Patient Gender: Female PCP Fax : [phone] Patient DOB: 20-Jan-1929 Practice Name: [facility] Reason for Call: [name], Other, calling regarding Urinary Pain/Bleeding. PCP is Plotnikov, Alex. Callback number is [phone]. [name]/Daughter calling regarding vaginal itching, possible yeast infection. Has been in hospital on IV abx for cellulitis, Macrobid for UTI. Afebrile. Has cottage cheese d/c. Has script for Terazol and started 03/31/11 with no relief, wants a Diflucan, has had before with relief. Emergent s/s for Vaginal Discharge/Irritation r/o per protocol except fro see in 72 hours due to vaginal s/s worsening despite 72 hours of treatment. Per office SO Diflucan 150 mg po x 1 no refills called to [facility] [id number]. Will f/u in 72 hours. Protocol(s) Used: Vaginal Discharge or Irritation Recommended Outcome per Protocol: See Provider within 24 hours Reason for Outcome: Discharge or vaginal symptoms worsening despite 72 hours or more of treatment Care Advice: ~ SYMPTOM / CONDITION MANAGEMENT ~ To help relieve itching/irritation, try a cool compress to vulva using a washcloth for 10-15 minutes. Refrain from douching, using scented deodorant tampons, or nonprescription medication until evaluated by provider. Do not use feminine hygiene sprays. Use condoms during sex. ~ 04/04/2011 12:10:41PM Page 1 of 1 [id number]

## 2011-04-06 NOTE — Telephone Encounter (Signed)
Attempted to return call, fast busy signal.

## 2011-04-07 ENCOUNTER — Ambulatory Visit (INDEPENDENT_AMBULATORY_CARE_PROVIDER_SITE_OTHER): Payer: Medicare Other | Admitting: Internal Medicine

## 2011-04-07 ENCOUNTER — Encounter: Payer: Self-pay | Admitting: Internal Medicine

## 2011-04-07 VITALS — BP 122/78 | HR 84 | Ht 65.5 in | Wt 142.0 lb

## 2011-04-07 DIAGNOSIS — J449 Chronic obstructive pulmonary disease, unspecified: Secondary | ICD-10-CM

## 2011-04-07 DIAGNOSIS — F17200 Nicotine dependence, unspecified, uncomplicated: Secondary | ICD-10-CM

## 2011-04-07 NOTE — Telephone Encounter (Signed)
Pt aware of Dr. Lamar Sprinkles recommendations. Pt wanted to know the next available midlevel appt, instructed pt to try Dr. Lamar Sprinkles suggestions and to call us back if she was still having problems with the indigestion. The phone went dead. Called the pt back and told her I thought we were disconnected and she stated that she had  hung up on me, she didn't like what I had said. Called pt back and offered to schedule her with a midlevel if she wanted and she stated no she would wait. Dr. [name] aware.

## 2011-04-07 NOTE — Telephone Encounter (Signed)
Certainly, for true indigestion, she could take an antacid or increased PPI. However, at her age, with complaints of chest pain, I would want to be sure that her problem is not cardiopulmonary. If her chest is still bothering her today, she should contact her PCP

## 2011-04-07 NOTE — Telephone Encounter (Signed)
I'm sorry that she was rude.

## 2011-04-07 NOTE — Patient Instructions (Signed)
Continue present treatment

## 2011-04-07 NOTE — Progress Notes (Signed)
Subjective:    Patient ID: Monique Fleming, female    DOB: 04/01/1929, 77 y.o.   MRN: [id number]  HPI    Review of Systems     Objective:   Physical Exam        Assessment & Plan:   Subjective:    Patient ID: Monique Fleming, female    DOB: 08-09-1929, 77 y.o.   MRN: [id number]  HPI 44 yoF former smoker with COPD and multiple medical problems.  Last here November 05, 2010. Since then she reports staying off cigarettes and having no significant respiratory events. Pollen bothered more in March with nasal congestion, postnasal drip, but not much wheeze or cough. She feels current meds are adequate.  04/07/11- 81 yoF current smoker with COPD complicated by DM, ASCVD, hx colon cancer, anemia., GERD, esophageal varices. Was hosp for red, inflamed arm after Pneumovax with concern then that she had cellulits. Has had UTI then yeast from antibiotics- to see her GYN Has indigestion with known liver disease and esophageal varices.  Doesn't describe active respiratory complaints- mild occasional cough, dyspnea with exertion unchanged. We talked again about smoking and tried to motivate to full cessation. Smokes a few daily.   Review of Systems See HPI Constitutional:   No weight loss, night sweats,  Fevers, chills, fatigue, lassitude. HEENT:   No headaches,  Difficulty swallowing,  Tooth/dental problems,  Sore throat,                No sneezing, itching, ear ache, nasal congestion, post nasal drip,   CV:  No chest pain,  Orthopnea, PND, swelling in lower extremities, anasarca, dizziness, palpitations  GI  No heartburn, indigestion, abdominal pain, nausea, vomiting, diarrhea, change in bowel habits, loss of appetite  Resp: No acute shortness of breath with routine exertion or at rest.  No excess mucus,   No coughing up of blood.  No change in color of mucus.  .  Skin: no rash or lesions.  GU: no dysuria, change in color of urine, no urgency or frequency.  No flank pain.  MS:  No  joint pain or swelling.  No decreased range of motion.  No back pain.  Psych:  No change in mood or affect. No depression or anxiety.  No memory loss.      Objective:   Physical Exam General- Alert, Oriented, Affect-appropriate, Distress- none acute.       calm, chatty  Skin- rash-none, lesions- none, excoriation- none  Lymphadenopathy- none  Head- atraumatic  Eyes- Gross vision intact, PERRLA, conjunctivae clear secretions  Ears- Hard of  Hearing,  Nose- Clear, No- Septal dev, mucus, polyps, erosion, perforation   Throat- Mallampati II , mucosa clear , drainage- none, tonsils- atrophic  Neck- flexible , trachea midline, no stridor , thyroid nl, carotid no bruit  Chest - symmetrical excursion , unlabored     Heart/CV- RRR , 1-2/6 systolic aortic murmur , no gallop  , no rub, nl s1 s2                     - JVD- none , edema- none, stasis changes- none, varices- none     Lung-  No wheeze or rhonchi today.    cough- none , dullness-none, rub- none     Chest wall-   Abd- tender-no, distended-no, bowel sounds-present, HSM- no  Br/ Gen/ Rectal- Not done, not indicated  Extrem- cyanosis- none, clubbing, none, atrophy- none, strength- nl  Neuro- grossly intact to  observation         Assessment & Plan:

## 2011-04-07 NOTE — Assessment & Plan Note (Signed)
Chronic bronchitis, now controlled.

## 2011-04-10 ENCOUNTER — Ambulatory Visit: Payer: Medicare Other | Admitting: Cardiovascular Disease

## 2011-04-10 ENCOUNTER — Encounter: Payer: Self-pay | Admitting: Cardiovascular Disease

## 2011-04-11 ENCOUNTER — Encounter: Payer: Self-pay | Admitting: Internal Medicine

## 2011-04-11 NOTE — Assessment & Plan Note (Signed)
I think she is close and with encouragement can stay off now.

## 2011-04-13 ENCOUNTER — Ambulatory Visit (INDEPENDENT_AMBULATORY_CARE_PROVIDER_SITE_OTHER): Payer: Medicare Other | Admitting: Cardiovascular Disease

## 2011-04-13 ENCOUNTER — Encounter: Payer: Self-pay | Admitting: Cardiovascular Disease

## 2011-04-13 DIAGNOSIS — I739 Peripheral vascular disease, unspecified: Secondary | ICD-10-CM

## 2011-04-13 DIAGNOSIS — F329 Major depressive disorder, single episode, unspecified: Secondary | ICD-10-CM

## 2011-04-13 DIAGNOSIS — J449 Chronic obstructive pulmonary disease, unspecified: Secondary | ICD-10-CM

## 2011-04-13 DIAGNOSIS — I7090 Unspecified atherosclerosis: Secondary | ICD-10-CM

## 2011-04-13 DIAGNOSIS — R011 Cardiac murmur, unspecified: Secondary | ICD-10-CM

## 2011-04-13 DIAGNOSIS — F17200 Nicotine dependence, unspecified, uncomplicated: Secondary | ICD-10-CM

## 2011-04-13 DIAGNOSIS — R079 Chest pain, unspecified: Secondary | ICD-10-CM

## 2011-04-13 NOTE — Assessment & Plan Note (Signed)
F/U Dr Maple Hudson  Not actively wheezing.  Consider 6 minute walk test for desaturation

## 2011-04-13 NOTE — Assessment & Plan Note (Signed)
Encouraged to F/U with therapist this week or have Dr Posey Rea adjust meds.  No suicidal thoughts but very labile.  Has good support from daughter

## 2011-04-13 NOTE — Assessment & Plan Note (Signed)
Counseled for less than 10 minutes with no motivation to quit despite COPD and vascular disease

## 2011-04-13 NOTE — Patient Instructions (Signed)
Your physician wants you to follow-up in: 6 MONTHS WITH DR Haywood Filler will receive a reminder letter in the mail two months in advance. If you don't receive a letter, please call our office to schedule the follow-up appointment.   Your physician has requested that you have a lexiscan myoview. For further information please visit [email or web address] Please follow instruction sheet, as given.     Your physician has requested that you have an echocardiogram. Echocardiography is a painless test that uses sound waves to create images of your heart. It provides your doctor with information about the size and shape of your heart and how well your heart's chambers and valves are working. This procedure takes approximately one hour. There are no restrictions for this procedure.

## 2011-04-13 NOTE — Assessment & Plan Note (Signed)
Pain in elderly smoker with known vascular disease including carotids and thoracic aorta.  After lengthy discussion she is willing to have Lexiscan to R/O CAD since her SSCP and "heartburn" is not relieved with max H2 blocker Rx

## 2011-04-13 NOTE — Assessment & Plan Note (Signed)
Carotid bruits followed by Pearlean Brownie.  No TIA Continue ASA  Duplex in 6 months.  Marker for CAD

## 2011-04-13 NOTE — Assessment & Plan Note (Signed)
F/U echo LIkely AV sclerosis or mild AS

## 2011-04-13 NOTE — Progress Notes (Signed)
Monique Fleming is a 77 year old woman with multiple  medical problems including diabetes, cerebrovascular disease, and presumed  COPD with ongoing tobacco use, who is referred for further evaluation of  weakness and fatigue. Monique Fleming denies any history of known coronary artery  disease. She has never had a cardiac catheterization. She tells me that in  2004, they thought she was having a heart attack and she had a Myoview which  showed an EF of 73% and no ischemia. She says it turned out to be low  potassium. She denies any history of chest pain. She does have a history  of cerebrovascular disease with previous TIAs and left carotid  endarterectomy. Over the past eight weeks, she has felt sick and been  troubled with severe bronchitis and sinusitis. She has been on multiple  rounds of antibiotics and finally was started on a nebulizer. She now says  she is starting to feel better. Throughout this, she has felt very weak.  She denies any chest pain or severe dyspnea, though she is quite sedentary.  She says she does not walk because it is too hot outside. She denies any  claudication.  She apparently was referred for stress testing to further evaluate her  weakness, however, she has refused this as her husband died the same day  after a stress test. She is, thus, referred here for further evaluation.  Verry labile in office today.  Depression worse.  Previously seen by Plotsky but now meds  Adjusted by Plotnikov and therapist  Increasing heart burn not totally improved with increase in H2 blocker.  Sometimes manifests As SSCP radiating to back.  Reviewed recent CT 6/12 while in hospital for infection and extensive astherosclerosis of thoracic aorta but no disection.    Counseled for less than 10 minutes on smoking cessation but no motivation to quit  Recent visit with Dr Pearlean Brownie and carotids apparantly stable without critical stenosis despite bilateral  Bruits.  ROS: Denies fever, malais,  weight loss, blurry vision, decreased visual acuity, cough, sputum, SOB, hemoptysis, pleuritic pain, palpitaitons, heartburn, abdominal pain, melena, lower extremity edema, claudication, or rash.  All other systems reviewed and negative  General: Labile depressed and crying Elderly and chronically ill HEENT: normal Neck supple with no adenopathy JVP normal bilateral  bruits no thyromegaly Lungs clear with no wheezing and good diaphragmatic motion Heart:  S1/S2 midl AS   murmur,rub, gallop or click PMI normal Abdomen: benighn, BS positve, no tenderness, no AAA no bruit.  No HSM or HJR Distal pulses intact with no bruits No edema Neuro non-focal Skin warm and dry No muscular weakness   Current Outpatient Prescriptions  Medication Sig Dispense Refill  . budesonide-formoterol (SYMBICORT) 80-4.5 MCG/ACT inhaler Inhale 2 puffs into the lungs 2 (two) times daily. Rinse mouth  2 Inhaler  prn  . Cholecalciferol (VITAMIN D3) 1000 UNITS tablet Take 1,000 Units by mouth daily.        . cholestyramine light (PREVALITE) 4 G packet        . clonazePAM (KLONOPIN) 0.5 MG tablet Take 2 tablets by mouth at bedtime      . clopidogrel (PLAVIX) 75 MG tablet Take 1 tablet (75 mg total) by mouth daily.  90 tablet  3  . escitalopram (LEXAPRO) 20 MG tablet Take 1 tablet (20 mg total) by mouth at bedtime.  90 tablet  3  . esomeprazole (NEXIUM) 40 MG capsule Take 40 mg by mouth 2 (two) times daily.       Marland Kitchen  ferrous sulfate 325 (65 FE) MG tablet TAKE ONE TABLET TWICE DAILY TAKE        INDEFINITELY  60 tablet  6  . glimepiride (AMARYL) 1 MG tablet Take 1 tablet (1 mg total) by mouth daily before breakfast.  90 tablet  3  . glucose blood test strip 1 each by Other route as needed. Use as instructed       . ibuprofen (ADVIL,MOTRIN) 600 MG tablet Take 1 tablet (600 mg total) by mouth every 6 (six) hours as needed.  90 tablet  3  . Nitrofurantoin Monohyd Macro (MACROBID PO) as directed.        . phenytoin (DILANTIN)  100 MG ER capsule Take 2 capsules by mouth at bedtime       . potassium chloride (KLOR-CON) 10 MEQ CR tablet Take 2 capsules by mouth daily       . triamcinolone (KENALOG) 0.5 % cream Apply topically 2 (two) times daily.        Marland Kitchen DISCONTD: phenytoin (DILANTIN) 100 MG ER capsule Take 2 capsules (200 mg total) by mouth at bedtime. Take 2 capsules by mouth at bedtime  180 capsule  3  . DISCONTD: clonazePAM (KLONOPIN) 0.5 MG tablet Take 2 tablets (1 mg total) by mouth at bedtime. Take 2 tablets by mouth at bedtime  60 tablet  5  . DISCONTD: potassium chloride (KLOR-CON) 10 MEQ CR tablet Take 2 tablets (20 mEq total) by mouth daily. Take 2 capsules by mouth daily  180 tablet  3  . DISCONTD: PREVALITE 4 G packet USE ONE PACKET TWICE DAILY  60 each  3    Allergies  Pneumovax; Lovastatin; Metformin; and Penicillins  Electrocardiogram:  NSR 84 nonspecific ST/T wave chagnes  Assessment and Plan

## 2011-04-16 ENCOUNTER — Emergency Department (HOSPITAL_BASED_OUTPATIENT_CLINIC_OR_DEPARTMENT_OTHER)
Admission: EM | Admit: 2011-04-16 | Discharge: 2011-04-16 | Disposition: A | Discharge status: Home / Self Care | Payer: Medicare Other | Attending: Emergency Medicine | Admitting: Emergency Medicine

## 2011-04-16 ENCOUNTER — Emergency Department (INDEPENDENT_AMBULATORY_CARE_PROVIDER_SITE_OTHER): Payer: Medicare Other

## 2011-04-16 ENCOUNTER — Encounter (HOSPITAL_BASED_OUTPATIENT_CLINIC_OR_DEPARTMENT_OTHER): Payer: Self-pay | Admitting: *Deleted

## 2011-04-16 ENCOUNTER — Other Ambulatory Visit: Payer: Self-pay

## 2011-04-16 DIAGNOSIS — J438 Other emphysema: Secondary | ICD-10-CM

## 2011-04-16 DIAGNOSIS — R05 Cough: Secondary | ICD-10-CM

## 2011-04-16 DIAGNOSIS — R0602 Shortness of breath: Secondary | ICD-10-CM

## 2011-04-16 DIAGNOSIS — J449 Chronic obstructive pulmonary disease, unspecified: Secondary | ICD-10-CM

## 2011-04-16 DIAGNOSIS — R110 Nausea: Secondary | ICD-10-CM | POA: Insufficient documentation

## 2011-04-16 DIAGNOSIS — Z79899 Other long term (current) drug therapy: Secondary | ICD-10-CM | POA: Insufficient documentation

## 2011-04-16 DIAGNOSIS — J4489 Other specified chronic obstructive pulmonary disease: Secondary | ICD-10-CM | POA: Insufficient documentation

## 2011-04-16 DIAGNOSIS — E860 Dehydration: Secondary | ICD-10-CM

## 2011-04-16 DIAGNOSIS — R109 Unspecified abdominal pain: Secondary | ICD-10-CM | POA: Insufficient documentation

## 2011-04-16 DIAGNOSIS — R062 Wheezing: Secondary | ICD-10-CM

## 2011-04-16 LAB — DIFFERENTIAL
Basophils Relative: 0 % (ref 0–1)
Eosinophils Absolute: 0.2 10*3/uL (ref 0.0–0.7)
Eosinophils Relative: 2 % (ref 0–5)
Monocytes Absolute: 0.9 10*3/uL (ref 0.1–1.0)
Monocytes Relative: 10 % (ref 3–12)
Neutro Abs: 5.1 10*3/uL (ref 1.7–7.7)

## 2011-04-16 LAB — COMPREHENSIVE METABOLIC PANEL
Albumin: 3.2 g/dL — ABNORMAL LOW (ref 3.5–5.2)
BUN: 7 mg/dL (ref 6–23)
Calcium: 9 mg/dL (ref 8.4–10.5)
Chloride: 105 mEq/L (ref 96–112)
Creatinine, Ser: 0.6 mg/dL (ref 0.50–1.10)
Total Bilirubin: 0.2 mg/dL — ABNORMAL LOW (ref 0.3–1.2)

## 2011-04-16 LAB — CBC
HCT: 37.9 % (ref 36.0–46.0)
Hemoglobin: 13.2 g/dL (ref 12.0–15.0)
MCH: 29.5 pg (ref 26.0–34.0)
MCHC: 34.8 g/dL (ref 30.0–36.0)
MCV: 84.6 fL (ref 78.0–100.0)

## 2011-04-16 LAB — URINALYSIS, ROUTINE W REFLEX MICROSCOPIC
Glucose, UA: NEGATIVE mg/dL
Leukocytes, UA: NEGATIVE
Protein, ur: NEGATIVE mg/dL
Urobilinogen, UA: 0.2 mg/dL (ref 0.0–1.0)

## 2011-04-16 LAB — CARDIAC PANEL(CRET KIN+CKTOT+MB+TROPI)
Relative Index: INVALID (ref 0.0–2.5)
Total CK: 88 U/L (ref 7–177)

## 2011-04-16 LAB — URINE MICROSCOPIC-ADD ON

## 2011-04-16 MED ORDER — ONDANSETRON 4 MG PO TBDP: 4.0000 mg | ORAL_TABLET | Freq: Three times a day (TID) | ORAL | Status: AC | PRN

## 2011-04-16 MED ORDER — SODIUM CHLORIDE 0.9 % IV BOLUS (SEPSIS)
1000.0000 mL | Freq: Once | INTRAVENOUS | Status: AC
  Administered 2011-04-16: 1000 mL via INTRAVENOUS

## 2011-04-16 MED ORDER — ONDANSETRON HCL 4 MG/2ML IJ SOLN
4.0000 mg | Freq: Once | INTRAMUSCULAR | Status: AC
  Administered 2011-04-16: 4 mg via INTRAVENOUS
  Filled 2011-04-16: qty 2

## 2011-04-16 NOTE — ED Provider Notes (Addendum)
History     Chief Complaint  Patient presents with  . Shortness of Breath  . Fatigue  . Abdominal Pain  . Nausea   HPI Comments: Patient presents to the emergency department with shortness of breath which occurred last night while sleeping and required a rescue inhaler of albuterol to help her feel better. This occurred again today while she was at home and required another dose for rest regular. Over the last month she has had a general decline with fatigue nausea and difficulty ambulating due to generalized weakness. She was admitted recently with a cellulitis of her shoulder after a Pneumovax injection. She subsequently had multiple doses of antibiotics for both pneumonia and urinary tract infections and yeast infections. Currently she has no shortness of breath no nausea but has mild suprapubic pain. She does have ongoing watery diarrhea but has been using an antidiarrheal medicine with improvement. The shortness of breath is intermittent severe and resolves completely with the medications at home. He has no associated fevers coughing back pain swelling or headaches. She is supposed to have a stress test and an echocardiogram on Monday with Dr. Eden Emms after she was found to have some aortic narrowing on CT scan while in the hospital.  Patient is a 77 y.o. female presenting with shortness of breath and abdominal pain. The history is provided by the patient, a relative and medical records.  Shortness of Breath  Associated symptoms include shortness of breath.  Abdominal Pain The primary symptoms of the illness include abdominal pain and shortness of breath.    Past Medical History  Diagnosis Date  . Aortic stenosis     moderate by echo 6/11 gradient 20/54mmHg for mean and peak  . TIA (transient ischemic attack)     Dr. Pearlean Brownie  . Peripheral vascular disease   . IBS (irritable bowel syndrome)   . Diabetes mellitus     type II  . Chronic bronchitis   . COPD (chronic obstructive pulmonary  disease)   . Atherosclerosis   . Depression   . Anxiety     Dr Donell Beers  . Renal cyst   . Cirrhosis   . Seizure disorder   . Anemia     iron deficiency  . Cellulitis   . Diverticular disease   . Infectious diarrhea   . Sinusitis   . Thrush   . Abnormal liver function test   . GERD (gastroesophageal reflux disease)   . Insomnia     chronic  . Adenocarcinoma 2008    colon,s/p right hemicolectomy    Past Surgical History  Procedure Date  . Carotid endarterectomy   . Colectomy     right hemi  . Breast lumpectomy     right  . Cholecystectomy 11/09    Rt Breast    Family History  Problem Relation Age of Onset  . Hypertension    . Colon cancer Paternal Grandmother   . Esophageal cancer Brother   . Heart disease Mother   . Diabetes Brother   . Hypertension Father     History  Substance Use Topics  . Smoking status: Former Smoker -- 0.5 packs/day    Quit date: 04/14/2011  . Smokeless tobacco: Not on file   Comment: Using Electronic cigarette  . Alcohol Use: No    OB History    Grav Para Term Preterm Abortions TAB SAB Ect Mult Living                  Review of  Systems  Respiratory: Positive for shortness of breath.   Gastrointestinal: Positive for abdominal pain.  All other systems reviewed and are negative.    Physical Exam  BP 143/50  Pulse 78  Temp(Src) 98.9 F (37.2 C) (Oral)  Resp 24  SpO2 98%  Physical Exam  Nursing note and vitals reviewed. Constitutional: She appears well-developed and well-nourished. No distress.  HENT:  Head: Normocephalic and atraumatic.  Mouth/Throat: No oropharyngeal exudate.       Mucous membranes mildly dehydrated  Eyes: Conjunctivae and EOM are normal. Pupils are equal, round, and reactive to light. Right eye exhibits no discharge. Left eye exhibits no discharge. No scleral icterus.  Neck: Normal range of motion. Neck supple. No JVD present. No thyromegaly present.  Cardiovascular: Normal rate, regular rhythm,  normal heart sounds and intact distal pulses.  Exam reveals no gallop and no friction rub.   No murmur heard. Pulmonary/Chest: Effort normal. No respiratory distress. She has wheezes. She has no rales.       Diffuse mild bilateral wheezing  Abdominal: Soft. Bowel sounds are normal. She exhibits no distension and no mass. There is tenderness.       Mild suprapubic tenderness  Musculoskeletal: Normal range of motion. She exhibits no edema and no tenderness.  Lymphadenopathy:    She has no cervical adenopathy.  Neurological: She is alert. Coordination normal.  Skin: Skin is warm and dry. No rash noted. She is not diaphoretic. No erythema.  Psychiatric: She has a normal mood and affect. Her behavior is normal.    ED Course  Procedures  MDM Patient has some dehydrated mucous membranes and mild suprapubic tenderness with mild wheezing. Her vital signs overall do not reflect an acute situation. We'll get a chest x-ray lab work urinalysis and start some IV fluids for fluid resuscitation. We'll give her some Zofran for nausea. With multiple doses of antibiotics it is possible that she has developed a C. differential colitis. We'll request diarrhea sample.    ED ECG REPORT   Date: 04/16/2011   Rate: 73  Rhythm: normal sinus rhythm  QRS Axis: normal  Intervals: normal  ST/T Wave abnormalities: nonspecific T wave changes  Conduction Disutrbances:none  Narrative Interpretation: QWaveas anteriorly  Old EKG Reviewed: non specific T wave changes diffusely and PRWP since last ECG  Patient states that after IV fluids and Zofran she is improved significantly. She has no shortness of breath, oxygen saturations of 100% on room air. I have answered all questions from the patient and her family member and they are amenable to discharge with followup with their family doctor in the next 2 days. They also understand the indications for return to the emergency department.  Vida Roller, MD 04/16/11  2127  All blood work unremarkable including urinalysis.  Vida Roller, MD 04/16/11 2128

## 2011-04-16 NOTE — ED Notes (Signed)
Pt has a hx of COPD and is SHOB with excursion and has an hx of colon CA, gastrointestinal issues and is present with SHOB. Pt states that she takes her inhaler Albuterol HFA when she is [facility].

## 2011-04-16 NOTE — ED Notes (Signed)
Pt transported to [facility]. Daughter at bedside and was updated on POC. Pt aware to provide a stool specimen. Pt reports she is unable to go at this time and will notify staff when able. Vital stable. Will continue to monitor.

## 2011-04-16 NOTE — ED Notes (Signed)
Daughter reports patient c/o lack of appetite x4days. Some upper abdominal pains and indigestion, but notices it decreases when increasing her dose of nexium. Also c/o lower abdominal pains since today, with dysuria. Pt is currently being treated for UTI and is taking Macrodantin. Pt is on her last day of taking the pills. Pt also c/o increased SOB than normal. States she wakes up feeling SOB and also increases more on exertion.

## 2011-04-16 NOTE — ED Notes (Signed)
Pt vitals stable. States her nausea has completely subsided. Denies any pain at this time. States she feels well enough for discharge. Daughter in agreement with plan.

## 2011-04-20 ENCOUNTER — Ambulatory Visit (HOSPITAL_COMMUNITY): Payer: Medicare Other | Attending: Cardiovascular Disease | Admitting: Radiology

## 2011-04-20 DIAGNOSIS — I359 Nonrheumatic aortic valve disorder, unspecified: Secondary | ICD-10-CM

## 2011-04-20 DIAGNOSIS — R0789 Other chest pain: Secondary | ICD-10-CM

## 2011-04-20 DIAGNOSIS — I491 Atrial premature depolarization: Secondary | ICD-10-CM

## 2011-04-20 DIAGNOSIS — F17200 Nicotine dependence, unspecified, uncomplicated: Secondary | ICD-10-CM | POA: Insufficient documentation

## 2011-04-20 DIAGNOSIS — R011 Cardiac murmur, unspecified: Secondary | ICD-10-CM | POA: Insufficient documentation

## 2011-04-20 DIAGNOSIS — R079 Chest pain, unspecified: Secondary | ICD-10-CM | POA: Insufficient documentation

## 2011-04-20 DIAGNOSIS — E119 Type 2 diabetes mellitus without complications: Secondary | ICD-10-CM | POA: Insufficient documentation

## 2011-04-20 DIAGNOSIS — I059 Rheumatic mitral valve disease, unspecified: Secondary | ICD-10-CM | POA: Insufficient documentation

## 2011-04-20 DIAGNOSIS — I079 Rheumatic tricuspid valve disease, unspecified: Secondary | ICD-10-CM | POA: Insufficient documentation

## 2011-04-20 MED ORDER — TECHNETIUM TC 99M TETROFOSMIN IV KIT
33.0000 | PACK | Freq: Once | INTRAVENOUS | Status: AC | PRN
  Administered 2011-04-20: 33 via INTRAVENOUS

## 2011-04-20 MED ORDER — REGADENOSON 0.4 MG/5ML IV SOLN
0.4000 mg | Freq: Once | INTRAVENOUS | Status: AC
  Administered 2011-04-20: 0.4 mg via INTRAVENOUS

## 2011-04-20 MED ORDER — TECHNETIUM TC 99M TETROFOSMIN IV KIT
11.0000 | PACK | Freq: Once | INTRAVENOUS | Status: AC | PRN
  Administered 2011-04-20: 11 via INTRAVENOUS

## 2011-04-20 NOTE — Progress Notes (Signed)
[facility] SITE 3 NUCLEAR MED [address] [place] [address] [phone]  Cardiology Nuclear Med Study  Monique Fleming is a 77 y.o. female [id number] 1929/02/19   Nuclear Med Background Indication for Stress Test:  Evaluation for Ischemia and  6/12 [facility] with allergic reaction and had panic attack. Patient had severe coughing which produced transient V Tach. Enzymes were negative.   History:  COPD and '04 MPS:No ischemia, EF=73%; 1/12 Echo:EF=60-65%, mild-moderate AS; 6/12 Cardiac UY:[id number] atherosclerosis of throcic aortia. Cardiac Risk Factors: Carotid Disease, NIDDM, PVD, Smoker and TIA  Symptoms:  Chest Pain/Pressure, DOE, Fatigue and Rapid HR   Nuclear Pre-Procedure Caffeine/Decaff Intake:  9:30pm NPO After: 9:30pm   Lungs:  Clear, with wheezing in upper airway at time of IV, O2 Sat 96% on RA.  Patient used her Symbicort.  Lungs:Prior to infusion were diminished, but no wheezes.  O2 Sat 96% on RA. IV 0.9% NS with Angio Cath:  20g  IV Site: R Wrist  IV Started by:  Cathlyn Parsons, RN  Chest Size (in):  40 Cup Size: C  Height: 5' 5.5" (1.664 m)  Weight:  140 lb (63.504 kg)  BMI:  Body mass index is 22.94 kg/(m^2). Tech Comments:  Patient with clear lungs but upper airway wheezing.  O2 sat  96%. Patient took 2 puffs of Symbicort.  Wheezing improved.    Nuclear Med Study 1 or 2 day study: 1 day  Stress Test Type:  Eugenie Birks  Reading MD: Dietrich Pates, MD  Order Authorizing Provider:  Charlton Haws, MD  Resting Radionuclide: Technetium 7m Tetrofosmin  Resting Radionuclide Dose: 11 mCi   Stress Radionuclide:  Technetium 74m Tetrofosmin  Stress Radionuclide Dose: 33 mCi           Stress Protocol Rest HR: 61 Stress HR: 79  Rest BP: 111/59 Stress BP: 124/52  Exercise Time (min): n/a METS: n/a   Predicted Max HR: 141 bpm % Max HR: 56.03 bpm Rate Pressure Product: 9796   Dose of Adenosine (mg):  n/a Dose of Lexiscan: 0.4 mg  Dose of  Atropine (mg): n/a Dose of Dobutamine: n/a mcg/kg/min (at max HR)  Stress Test Technologist: Smiley Houseman, CMA-N  Nuclear Technologist:  Domenic Polite, CNMT     Rest Procedure:  Myocardial perfusion imaging was performed at rest 45 minutes following the intravenous administration of Technetium 52m Tetrofosmin.  Rest ECG: Nonspecific T-wave changes with occasional PAC's.  Stress Procedure:  The patient received IV Lexiscan 0.4 mg over 15-seconds.  Technetium 52m Tetrofosmin injected at 30-seconds.  There were no significant changes with Lexiscan, only occasional PAC's.  Quantitative spect images were obtained after a 45 minute delay.  Stress ECG: No significant change from baseline ECG  QPS Raw Data Images:  Normal; no motion artifact; normal heart/lung ratio. Stress Images:  Normal homogeneous uptake in all areas of the myocardium. Rest Images:  Normal homogeneous uptake in all areas of the myocardium. Subtraction (SDS):  No evidence of ischemia. Transient Ischemic Dilatation (Normal <1.22):  1.0 Lung/Heart Ratio (Normal <0.45):  .35  Quantitative Gated Spect Images QGS EDV:  55 ml QGS ESV:  15 ml QGS cine images:  NL LV Function; NL Wall Motion QGS EF: 74%  Impression Exercise Capacity:  Lexiscan with no exercise. BP Response:  Normal blood pressure response. Clinical Symptoms:  No chest pain. ECG Impression:  No significant ST segment change suggestive of ischemia. Comparison with Prior Nuclear Study:No change from previous report of scan.  Overall Impression:  Normal stress nuclear study.

## 2011-04-21 NOTE — Progress Notes (Signed)
muc med report routed to Dr. Eden Emms 04/21/11 Domenic Polite

## 2011-04-23 ENCOUNTER — Telehealth: Payer: Self-pay | Admitting: Cardiovascular Disease

## 2011-04-23 ENCOUNTER — Telehealth: Payer: Self-pay | Admitting: *Deleted

## 2011-04-23 NOTE — Progress Notes (Signed)
Detailed message left for dtr on voice mail Debra Mathis  

## 2011-04-23 NOTE — Telephone Encounter (Signed)
Pt's dtr calling re results of stress test and echo from Monday, pls call ok to leave detailed message pt's dtr a cardiac care nurse

## 2011-04-23 NOTE — Telephone Encounter (Signed)
Pt is requesting rf on Klonopin 2 po qhs and 1 bid prn.  She will be out Friday. Please advise ok to Rf? If so, please advise sig and quantity.

## 2011-04-23 NOTE — Telephone Encounter (Signed)
Detailed message left for dtr on voice mail Monique Fleming

## 2011-04-23 NOTE — Telephone Encounter (Signed)
OK to fill this prescription with additional refills x3 Thank you!  

## 2011-04-24 MED ORDER — CLONAZEPAM 0.5 MG PO TABS: 1.0000 mg | ORAL_TABLET | Freq: Every evening | ORAL | Status: DC | PRN

## 2011-04-24 NOTE — Telephone Encounter (Signed)
Rf phoned in.  

## 2011-04-27 ENCOUNTER — Telehealth: Payer: Self-pay | Admitting: *Deleted

## 2011-04-27 NOTE — Telephone Encounter (Signed)
Daughter, [name] is req a call back. She needs info from pt's pneumonia vaccine (pt had reaction) - needs name of med, lot, exp & manuf.

## 2011-04-28 NOTE — Telephone Encounter (Signed)
Info given to patient's

## 2011-05-06 ENCOUNTER — Telehealth: Payer: Self-pay | Admitting: Internal Medicine

## 2011-05-06 NOTE — Telephone Encounter (Signed)
Left message for pt to call back.  Daughter called back and her mother is still complaining of lots of indigestion, gas and bloating. States the pt has been taking tums and has doubled her PPI and it has not helped. Requesting an appt. Informed daughter the midlevel schedule is not out yet for next week and I would call her with an appt when the schedule is available.  Leave appt time and date with midlevel when schedule available. Spoke with pts daughter.

## 2011-05-08 NOTE — Telephone Encounter (Signed)
Pt scheduled to see Mike Gip PA 05/12/11@1 :30pm. Daughter aware of appt date and time, message left on her voicemail as per her instruction.

## 2011-05-12 ENCOUNTER — Other Ambulatory Visit: Payer: Medicare Other

## 2011-05-12 ENCOUNTER — Ambulatory Visit (INDEPENDENT_AMBULATORY_CARE_PROVIDER_SITE_OTHER): Payer: Medicare Other | Admitting: Physician Assistant

## 2011-05-12 ENCOUNTER — Other Ambulatory Visit (INDEPENDENT_AMBULATORY_CARE_PROVIDER_SITE_OTHER): Payer: Medicare Other

## 2011-05-12 ENCOUNTER — Encounter: Payer: Self-pay | Admitting: Physician Assistant

## 2011-05-12 VITALS — BP 132/70 | HR 88 | Ht 65.0 in | Wt 141.0 lb

## 2011-05-12 DIAGNOSIS — R197 Diarrhea, unspecified: Secondary | ICD-10-CM

## 2011-05-12 DIAGNOSIS — R141 Gas pain: Secondary | ICD-10-CM

## 2011-05-12 DIAGNOSIS — R143 Flatulence: Secondary | ICD-10-CM

## 2011-05-12 LAB — CBC WITH DIFFERENTIAL/PLATELET
Basophils Absolute: 0 10*3/uL (ref 0.0–0.1)
HCT: 41.1 % (ref 36.0–46.0)
Hemoglobin: 14.1 g/dL (ref 12.0–15.0)
Lymphs Abs: 2.8 10*3/uL (ref 0.7–4.0)
MCHC: 34.3 g/dL (ref 30.0–36.0)
MCV: 86.9 fl (ref 78.0–100.0)
Monocytes Absolute: 1 10*3/uL (ref 0.1–1.0)
Neutro Abs: 5.6 10*3/uL (ref 1.4–7.7)
Platelets: 170 10*3/uL (ref 150.0–400.0)
RDW: 14.2 % (ref 11.5–14.6)

## 2011-05-12 LAB — BASIC METABOLIC PANEL
CO2: 27 mEq/L (ref 19–32)
Calcium: 9.2 mg/dL (ref 8.4–10.5)
Creatinine, Ser: 0.7 mg/dL (ref 0.4–1.2)
GFR: 84.25 mL/min (ref >60.00)
Glucose, Bld: 125 mg/dL — ABNORMAL HIGH (ref 70–99)
Sodium: 141 mEq/L (ref 135–145)

## 2011-05-12 MED ORDER — CHOLESTYRAMINE LIGHT 4 G PO PACK: PACK | ORAL | Status: DC

## 2011-05-12 MED ORDER — SACCHAROMYCES BOULARDII 250 MG PO CAPS: 250.0000 mg | ORAL_CAPSULE | Freq: Two times a day (BID) | ORAL | Status: DC

## 2011-05-12 MED ORDER — METRONIDAZOLE 250 MG PO TABS: ORAL_TABLET | ORAL | Status: AC

## 2011-05-12 NOTE — Patient Instructions (Signed)
Go to the Lab in the basement for your labwork today. Low fiber diet handout given. Sent prescriptions to your pharmacy for Flagyl, Florastor and Prevalite increase to 2 packets daily.  You have a follow up appointment with Dr. [name] on August 29th at 1:30pm.

## 2011-05-12 NOTE — Progress Notes (Signed)
Subjective:    Patient ID: Monique Fleming, female    DOB: 04/22/1929, 77 y.o.   MRN: [id number]  Monique Fleming is a very nice 77 year old white female known to Dr. [name] who has multiple medical problems, including COPD cerebrovascular disease adult onset diabetes cryptogenic cirrhosis and history of colon cancer stage I diagnosed in 2008. She is status post right hemicolectomy. She also has history of colonic and duodenal AVMs and colon polyps. She underwent upper endoscopy in January 2012 which showed normal esophagus no evidence of esophageal varices and AVMs in the duodenum. Last colon was done in April of 2011 and showed the AV malformations in the descending colon 4 small polyps and internal hemorrhoids.  She has had ongoing issues over the past couple of months with what sounds like a reaction to the Pneumovax vaccine and possibly Levaquin. She required admission to the hospital [id number] with left upper extremity edema and probable cellulitis from allergic reaction she was treated with steroids at that time and then sent home on doxycycline. She says since then she also had a urinary tract infection and was given another antibiotic by her primary care doctor but she does not remember the name of. Over the past month or so she has been having problems with decreased appetite and abdominal bloating and nausea. Over the past 2 weeks she has had more bloating postprandial gas and distention and an increase in her somewhat chronic diarrhea. She has been on Prevalite once daily for many months for chronic diarrhea and says that this usually works however over the past few weeks it is not been working as well. She says she actually had an episode yesterday where she had 5 loose to watery bowel movements at home took her Prevalite in order to try to go to the grocery store and then had "a major blowout" while at the grocery store which she said was horribly embarrassing. She says she is not having any normal bowel  movements. She denies any melena or hematochezia. She says she's been eating a very bland diet and is somewhat afraid to eat because of a couple episodes of more acute  bloating and gas that she had within the past couple of weeks. She is supposed to go on vacation at the end of the week with her family.    Review of Systems  Constitutional: Positive for appetite change and fatigue.  HENT: Negative.   Eyes: Negative.   Respiratory: Negative.   Cardiovascular: Negative.   Gastrointestinal: Positive for nausea, diarrhea and abdominal distention.  Genitourinary: Negative.   Musculoskeletal: Negative.   Skin: Negative.   Neurological: Negative.   Hematological: Negative.   Psychiatric/Behavioral: Negative.    Outpatient Prescriptions Prior to Visit  Medication Sig Dispense Refill  . budesonide-formoterol (SYMBICORT) 80-4.5 MCG/ACT inhaler Inhale 2 puffs into the lungs 2 (two) times daily. Rinse mouth  2 Inhaler  prn  . Cholecalciferol (VITAMIN D3) 1000 UNITS tablet Take 1,000 Units by mouth daily.        . clonazePAM (KLONOPIN) 0.5 MG tablet Take 2 tablets (1 mg total) by mouth at bedtime as needed. For sleep  120 tablet  3  . clopidogrel (PLAVIX) 75 MG tablet Take 1 tablet (75 mg total) by mouth daily.  90 tablet  3  . escitalopram (LEXAPRO) 20 MG tablet Take 1 tablet (20 mg total) by mouth at bedtime.  90 tablet  3  . esomeprazole (NEXIUM) 40 MG capsule Take 40 mg by mouth 2 (two) times  daily.       . ferrous sulfate 325 (65 FE) MG tablet TAKE ONE TABLET TWICE DAILY TAKE        INDEFINITELY  60 tablet  6  . glimepiride (AMARYL) 1 MG tablet Take 1 tablet (1 mg total) by mouth daily before breakfast.  90 tablet  3  . glucose blood test strip 1 each by Other route as needed. Use as instructed       . ibuprofen (ADVIL,MOTRIN) 600 MG tablet Take 600 mg by mouth as needed. For pain       . phenytoin (DILANTIN) 100 MG ER capsule Take 2 capsules by mouth at bedtime       . potassium chloride  (KLOR-CON) 10 MEQ CR tablet Take 20 mEq by mouth daily. Take 2 capsules by mouth daily      . triamcinolone (KENALOG) 0.5 % cream Apply topically 2 (two) times daily as needed.       . cholestyramine light (PREVALITE) 4 G packet Take 1 packet by mouth as needed. For cholesterol and itching       Allergies  Allergen Reactions  . Levofloxacin Other (See Comments)    seizure  . Pneumovax (Pneumococcal Polysaccharides) Other (See Comments)    Severe allergy-cellulitis  . Augmentin Other (See Comments)    Patient stated Drs. recommend taking Augmentin due to her PCN allergy.  . Penicillins Rash    unknown  . Sudafed (Pseudoephedrine Hcl) Other (See Comments)    seizure       Objective:   Physical Exam Well-developed elderly white female in no acute distress, pleasant, alert oriented x3, HEENT; nontraumatic normocephalic EOMI PERRLA sclera anicteric Neck; supple no JVD Cardio- Vascular regular rate and rhythm with S1 and S2 soft systolic murmur Pulmonary; scattered wheezes ,abdomen ;soft bowel sounds active, mild bilateral lower quadrant tenderness no guarding no rebound no palpable mass or hepatosplenomegaly  Rectal; not done  Extremities ;no clubbing cyanosis or edema, Psych; mood and affect normal an appropriate.        Assessment & Plan:  #59 77 year old female with multiple medical problems with complaints of bloating gas and increase in chronic diarrhea, after 2 recent courses of antibiotics. Need to rule out C. difficile, versus antibiotic induced diarrhea and dyspepsia.  Plan; CBC,Bmet,  stool for lactoferrin and C. difficile PCR Start florastor one by mouth twice daily x1 month As patient would like to go out of town and is not sure which day she can bring back her stool specimens will go ahead and start Flagyl 250 mg 4 times daily x14 days Start low residue diet Increase Prevalite to 4 g twice daily for at least 2 hours away from meds Followup with Dr. [name] in 2-3 weeks ,  patient is advised to call in the interim should her symptoms worsen  #2 Personal history of colon cancer status post right hemicolectomy Up-to-date on colonoscopy, last exam April 2011  #3 History of duodenal and colonic AVMs  #4 History of chronic iron deficiency anemia secondary to above  #5 Chronic anticoagulation with Plavix secondary to cerebrovascular disease  #6 cryptogenic cirrhosis, compensated.

## 2011-05-12 NOTE — Progress Notes (Signed)
I agree with assessment and plan.

## 2011-05-13 ENCOUNTER — Telehealth: Payer: Self-pay

## 2011-05-13 NOTE — Telephone Encounter (Signed)
Nicki Guadalajara w/ [employer] called [facility] for Dr Macario Golds nurse to return her call regarding patient. She states that pt was given pneumovax and developed cellulitis that same day. Patient was admitted to hospital and d/c 04/21/11. She is requesting a call back at [phone] ext [phone] needing inpatient dates etc. Thanks

## 2011-05-15 ENCOUNTER — Telehealth: Payer: Self-pay | Admitting: *Deleted

## 2011-05-15 NOTE — Telephone Encounter (Signed)
Message copied by Daphine Deutscher on Fri May 15, 2011  3:30 PM ------      Message from: [place], [place] S      Created: Fri May 15, 2011  3:06 PM       Please notify pt that c diff  Is positive-so she has an infection. She should complete the whole course of flagyl and florastor, then have a rov  thanks

## 2011-05-15 NOTE — Telephone Encounter (Signed)
Left a message for patient to call

## 2011-05-18 ENCOUNTER — Telehealth: Payer: Self-pay | Admitting: *Deleted

## 2011-05-18 ENCOUNTER — Ambulatory Visit: Payer: Medicare Other | Admitting: Internal Medicine

## 2011-05-18 MED ORDER — SACCHAROMYCES BOULARDII 250 MG PO CAPS: 250.0000 mg | ORAL_CAPSULE | Freq: Two times a day (BID) | ORAL | Status: AC

## 2011-05-18 NOTE — Telephone Encounter (Signed)
Patient did not get Florastor rx at her pharmacy. Spoke with pharmacist and they did not receive this rx. Rx gave and resent. Patients daughter aware.

## 2011-05-18 NOTE — Telephone Encounter (Signed)
Spoke with patient's daughter and gave her the results and Mike Gip, [place] recommendations. Patient scheduled for OV on 06/03/11 at 1:30 PM with Dr. [name].

## 2011-06-03 ENCOUNTER — Ambulatory Visit (INDEPENDENT_AMBULATORY_CARE_PROVIDER_SITE_OTHER): Payer: Medicare Other | Admitting: Internal Medicine

## 2011-06-03 ENCOUNTER — Encounter: Payer: Self-pay | Admitting: Internal Medicine

## 2011-06-03 VITALS — BP 124/60 | HR 80 | Ht 65.0 in | Wt 143.0 lb

## 2011-06-03 DIAGNOSIS — A0472 Enterocolitis due to Clostridium difficile, not specified as recurrent: Secondary | ICD-10-CM

## 2011-06-03 MED ORDER — METRONIDAZOLE 250 MG PO TABS: 250.0000 mg | ORAL_TABLET | Freq: Four times a day (QID) | ORAL | Status: DC

## 2011-06-03 NOTE — Progress Notes (Signed)
HISTORY OF PRESENT ILLNESS:  Monique Fleming is a 77 y.o. female with multiple significant medical problems as well outlined who presents today for office followup. She was evaluated by the physician assistant 05/12/2011 for complaints of bloating, gas, and increased diarrhea over her chronic diarrhea. This after having had 2 recent courses of antibiotic therapies. Laboratories were remarkable for positive Clostridium difficile testing. She was placed on metronidazole 250 mg 4 times a day x14 days and presents now for followup. Overall she states improvement in her diarrhea. However, she does not feel that she is at her baseline. No problems with nausea, vomiting, or bleeding. She denies fevers or abdominal discomfort other than intermittent lower abdominal cramping, which is chronic.  REVIEW OF SYSTEMS:  Noncontributory  Past Medical History  Diagnosis Date  . Aortic stenosis     moderate by echo 6/11 gradient 20/56mmHg for mean and peak  . TIA (transient ischemic attack)     Dr. Pearlean Brownie  . Peripheral vascular disease   . IBS (irritable bowel syndrome)   . Diabetes mellitus     type II  . Chronic bronchitis   . COPD (chronic obstructive pulmonary disease)   . Atherosclerosis   . Depression   . Anxiety     Dr Donell Beers  . Renal cyst   . Cirrhosis   . Seizure disorder   . Anemia     iron deficiency  . Cellulitis   . Diverticular disease   . Infectious diarrhea   . Sinusitis   . Thrush   . Abnormal liver function test   . GERD (gastroesophageal reflux disease)   . Insomnia     chronic  . Adenocarcinoma 2008    colon,s/p right hemicolectomy    Past Surgical History  Procedure Date  . Carotid endarterectomy   . Colectomy     right hemi  . Breast lumpectomy     right  . Cholecystectomy 11/09    Rt Breast    Social History Monique Fleming  reports that she quit smoking about 7 weeks ago. She has never used smokeless tobacco. She reports that she does not drink alcohol or use  illicit drugs.  family history includes Colon cancer in her paternal grandmother; Diabetes in her brother; Esophageal cancer in her brother; Heart disease in her mother; and Hypertension in her father and unspecified family member.  Allergies  Allergen Reactions  . Levofloxacin Other (See Comments)    seizure  . Pneumovax (Pneumococcal Polysaccharides) Other (See Comments)    Severe allergy-cellulitis  . Augmentin Other (See Comments)    Patient stated Drs. recommend taking Augmentin due to her PCN allergy.  . Penicillins Rash    unknown  . Sudafed (Pseudoephedrine Hcl) Other (See Comments)    seizure       PHYSICAL EXAMINATION: Vital signs: BP 124/60  Pulse 80  Ht 5\' 5"  (1.651 m)  Wt 143 lb (64.864 kg)  BMI 23.80 kg/m2 General: Well-developed, well-nourished, no acute distress HEENT: Sclerae are anicteric, conjunctiva pink. Oral mucosa intact Lungs: Clear Heart: Regular Abdomen: soft, nontender, nondistended, no obvious ascites, no peritoneal signs, normal bowel sounds. No organomegaly. Extremities: No edema Psychiatric: alert and oriented x3. Cooperative    ASSESSMENT:  #1. Recently diagnosed Clostridium difficile diarrhea. Improved, though not at baseline, since a two-week course of metronidazole. Continues on probiotic. #2. Other GI problems including cryptogenic cirrhosis, history of colon cancer, gastrointestinal AVMs, and IBS with chronic diarrhea #3. Multiple significant non-GI medical problems as outlined.  PLAN:  #1. Prescribed additional two-week course of metronidazole 250 mg 4 times a day. One refill. #2. Patient has been instructed to contact the office and she has continuing problems by a second course of therapy. If additional problems present, I would recheck her stool for Clostridium difficile PCR. If positive, change to vancomycin. If negative, treat with binding resident Questran and antidiarrheals such as Imodium.

## 2011-06-03 NOTE — Patient Instructions (Signed)
Please pick up your prescription at your pharmacy.  Complete prescription as prescribed and 1 refill given to you if you need it in the future.

## 2011-06-09 ENCOUNTER — Encounter: Payer: Self-pay | Admitting: Internal Medicine

## 2011-06-09 ENCOUNTER — Ambulatory Visit (INDEPENDENT_AMBULATORY_CARE_PROVIDER_SITE_OTHER): Payer: Medicare Other | Admitting: Internal Medicine

## 2011-06-09 VITALS — BP 150/88 | HR 93 | Ht 65.5 in | Wt 143.6 lb

## 2011-06-09 DIAGNOSIS — J449 Chronic obstructive pulmonary disease, unspecified: Secondary | ICD-10-CM

## 2011-06-09 NOTE — Patient Instructions (Signed)
Flu vax  Please call as needed  

## 2011-06-09 NOTE — Progress Notes (Signed)
Subjective:    Patient ID: Monique Fleming, female    DOB: [birth year], 77 y.o.   MRN: [id number]  HPI     Review of Systems      Objective:   Physical Exam         Assessment & Plan:   Subjective:    Patient ID: Monique Fleming, female    DOB: [birth year], 77 y.o.   MRN: [id number]  HPI 76 yoF former smoker with COPD and multiple medical problems.  Last here November 05, 2010. Since then she reports staying off cigarettes and having no significant respiratory events. Pollen bothered more in March with nasal congestion, postnasal drip, but not much wheeze or cough. She feels current meds are adequate.  04/07/11- 81 yoF current smoker with COPD complicated by DM, ASCVD, hx colon cancer, anemia., GERD, esophageal varices. Was hosp for red, inflamed arm after Pneumovax with concern then that she had cellulits. Has had UTI then yeast from antibiotics- to see her GYN Has indigestion with known liver disease and esophageal varices.  Doesn't describe active respiratory complaints- mild occasional cough, dyspnea with exertion unchanged. We talked again about smoking and tried to motivate to full cessation. Smokes a few daily.  06/09/11- 79 yoF current smoker with COPD complicated by DM, ASCVD, hx colon cancer, anemia., GERD, esophageal varices. Reports doing well. Discussed flu shot after her sore arm from the pneumovax, and decided they were different enough that there should not be a similar reaction noted with flu vax.  She has not had significant recent cough, wheeze, phlegm, chest pain or palpitation.   Review of Systems See HPI Constitutional:   No weight loss, night sweats,  Fevers, chills, fatigue, lassitude. HEENT:   No headaches,  Difficulty swallowing,  Tooth/dental problems,  Sore throat,                No sneezing, itching, ear ache, nasal congestion, post nasal drip,  CV:  No chest pain,  Orthopnea, PND, swelling in lower extremities, anasarca, dizziness, palpitations GI   No heartburn, indigestion, abdominal pain, nausea, vomiting, diarrhea, change in bowel habits, loss of appetite Resp: No acute shortness of breath with routine exertion or at rest.  No excess mucus,   No coughing up of blood.  No change in color of mucus.  .Skin: no rash or lesions. GU: no dysuria, change in color of urine, no urgency or frequency.  No flank pain. MS:  No joint pain or swelling.  No decreased range of motion.  No back pain. Psych:  No change in mood or affect. No depression or anxiety.  No memory loss.      Objective:   Physical Exam General- Alert, Oriented, Affect-appropriate, Distress- none acute.       calm, chatty  Skin- rash-none, lesions- none, excoriation- none  Age related and solar changes.  Lymphadenopathy- none  Head- atraumatic  Eyes- Gross vision intact, PERRLA, conjunctivae clear secretions  Ears- Hard of  Hearing,  Nose- Clear, No- Septal dev, mucus, polyps, erosion, perforation   Throat- Mallampati II , mucosa clear , drainage- none, tonsils- atrophic,  dentures  Neck- flexible , trachea midline, no stridor , thyroid nl, carotid no bruit  Chest - symmetrical excursion , unlabored     Heart/CV- RRR , 1-2/6 systolic aortic murmur , no gallop  , no rub, nl s1 s2                     - JVD-  none , edema- none, stasis changes- none, varices- none     Lung-  Trace expiratory wheeze anteriorly. .    cough- none , dullness-none, rub- none     Chest wall-   Abd- tender-no, distended-no, bowel sounds-present, HSM- no  Br/ Gen/ Rectal- Not done, not indicated  Extrem- cyanosis- none, clubbing, none, atrophy- none, strength- nl  Neuro- grossly intact to observation         Assessment & Plan:

## 2011-06-09 NOTE — Assessment & Plan Note (Signed)
She is trying to remain off of cigarettes. Minimal recent cough or phlegm, indicating that her bronchitis is well controlled. I think the risk to her of influenza is greater than the chance that she will have a significant reaction to the flu vaccine and she agrees to get vaccinated.

## 2011-06-22 ENCOUNTER — Telehealth: Payer: Self-pay | Admitting: Internal Medicine

## 2011-06-22 DIAGNOSIS — R197 Diarrhea, unspecified: Secondary | ICD-10-CM

## 2011-06-22 NOTE — Telephone Encounter (Signed)
Pt is calling to give an update on her condition. States she has finished her Flagyl and Probiotic but is still feeling terrible. She states that she is still having what she considers "blow outs." States the odor is just horrible. Pt was last seen 06/03/11, ov notes state she may need to be retested for c diff. If still having diarrhea. Dr. [name] please advise.

## 2011-06-22 NOTE — Telephone Encounter (Signed)
Stool for C. Diff PCR

## 2011-06-23 ENCOUNTER — Other Ambulatory Visit (INDEPENDENT_AMBULATORY_CARE_PROVIDER_SITE_OTHER): Payer: Medicare Other

## 2011-06-23 DIAGNOSIS — E119 Type 2 diabetes mellitus without complications: Secondary | ICD-10-CM

## 2011-06-23 DIAGNOSIS — E538 Deficiency of other specified B group vitamins: Secondary | ICD-10-CM

## 2011-06-23 LAB — COMPREHENSIVE METABOLIC PANEL
ALT: 18 U/L (ref 0–35)
AST: 37 U/L (ref 0–37)
Alkaline Phosphatase: 76 U/L (ref 39–117)
Chloride: 98 mEq/L (ref 96–112)
Creatinine, Ser: 0.7 mg/dL (ref 0.4–1.2)
Total Bilirubin: 0.7 mg/dL (ref 0.3–1.2)

## 2011-06-23 LAB — HEMOGLOBIN A1C: Hgb A1c MFr Bld: 6.4 % (ref 4.6–6.5)

## 2011-06-23 NOTE — Telephone Encounter (Signed)
Pt aware.

## 2011-06-26 ENCOUNTER — Encounter: Payer: Self-pay | Admitting: Internal Medicine

## 2011-06-26 ENCOUNTER — Ambulatory Visit (INDEPENDENT_AMBULATORY_CARE_PROVIDER_SITE_OTHER): Payer: Medicare Other | Admitting: Internal Medicine

## 2011-06-26 ENCOUNTER — Other Ambulatory Visit: Payer: Self-pay | Admitting: Internal Medicine

## 2011-06-26 ENCOUNTER — Other Ambulatory Visit: Payer: Medicare Other

## 2011-06-26 DIAGNOSIS — K589 Irritable bowel syndrome without diarrhea: Secondary | ICD-10-CM

## 2011-06-26 DIAGNOSIS — R3915 Urgency of urination: Secondary | ICD-10-CM

## 2011-06-26 DIAGNOSIS — E119 Type 2 diabetes mellitus without complications: Secondary | ICD-10-CM

## 2011-06-26 DIAGNOSIS — D518 Other vitamin B12 deficiency anemias: Secondary | ICD-10-CM

## 2011-06-26 DIAGNOSIS — K7460 Unspecified cirrhosis of liver: Secondary | ICD-10-CM

## 2011-06-26 DIAGNOSIS — R197 Diarrhea, unspecified: Secondary | ICD-10-CM

## 2011-06-26 DIAGNOSIS — J449 Chronic obstructive pulmonary disease, unspecified: Secondary | ICD-10-CM

## 2011-06-26 DIAGNOSIS — F329 Major depressive disorder, single episode, unspecified: Secondary | ICD-10-CM

## 2011-06-26 LAB — URINALYSIS, ROUTINE W REFLEX MICROSCOPIC
Bilirubin Urine: NEGATIVE
Nitrite: POSITIVE
Total Protein, Urine: 100
Urine Glucose: NEGATIVE
pH: 6 (ref 5.0–8.0)

## 2011-06-26 MED ORDER — BUDESONIDE-FORMOTEROL FUMARATE 80-4.5 MCG/ACT IN AERO: 2.0000 | INHALATION_SPRAY | Freq: Two times a day (BID) | RESPIRATORY_TRACT | Status: DC

## 2011-06-26 MED ORDER — CLOPIDOGREL BISULFATE 75 MG PO TABS: 75.0000 mg | ORAL_TABLET | Freq: Every day | ORAL | Status: DC

## 2011-06-26 MED ORDER — POTASSIUM CHLORIDE 10 MEQ PO TBCR: 20.0000 meq | EXTENDED_RELEASE_TABLET | Freq: Every day | ORAL | Status: DC

## 2011-06-26 MED ORDER — PHENYTOIN SODIUM EXTENDED 100 MG PO CAPS: 100.0000 mg | ORAL_CAPSULE | Freq: Every day | ORAL | Status: DC

## 2011-06-26 MED ORDER — FERROUS SULFATE 325 (65 FE) MG PO TABS: 325.0000 mg | ORAL_TABLET | Freq: Every day | ORAL | Status: DC

## 2011-06-26 MED ORDER — IBUPROFEN 600 MG PO TABS: 600.0000 mg | ORAL_TABLET | ORAL | Status: DC | PRN

## 2011-06-26 MED ORDER — CLONAZEPAM 0.5 MG PO TABS: 1.0000 mg | ORAL_TABLET | Freq: Every evening | ORAL | Status: DC | PRN

## 2011-06-26 MED ORDER — GLIMEPIRIDE 1 MG PO TABS: 1.0000 mg | ORAL_TABLET | Freq: Every day | ORAL | Status: DC

## 2011-06-26 MED ORDER — ESCITALOPRAM OXALATE 20 MG PO TABS: 20.0000 mg | ORAL_TABLET | Freq: Every day | ORAL | Status: DC

## 2011-06-26 MED ORDER — CHOLESTYRAMINE LIGHT 4 G PO PACK: PACK | ORAL | Status: DC

## 2011-06-26 NOTE — Progress Notes (Signed)
  Subjective:    Patient ID: Monique Fleming, female    DOB: 1929-09-14, 77 y.o.   MRN: [id number]  HPI  The patient presents for a follow-up of  chronic hypertension, chronic dyslipidemia, type 2 diabetes controlled with medicines    Review of Systems  Constitutional: Negative for chills, activity change, appetite change, fatigue and unexpected weight change.  HENT: Negative for congestion, mouth sores and sinus pressure.   Eyes: Negative for visual disturbance.  Respiratory: Negative for cough and chest tightness.   Gastrointestinal: Negative for nausea and abdominal pain.  Genitourinary: Positive for urgency. Negative for frequency, difficulty urinating and vaginal pain.  Musculoskeletal: Positive for back pain and gait problem.  Skin: Negative for pallor and rash.  Neurological: Negative for dizziness, tremors, weakness, numbness and headaches.  Psychiatric/Behavioral: Negative for confusion and sleep disturbance.       Objective:   Physical Exam  Constitutional: She appears well-developed and well-nourished. No distress.  HENT:  Head: Normocephalic.  Right Ear: External ear normal.  Left Ear: External ear normal.  Nose: Nose normal.  Mouth/Throat: Oropharynx is clear and moist.  Eyes: Conjunctivae are normal. Pupils are equal, round, and reactive to light. Right eye exhibits no discharge. Left eye exhibits no discharge.  Neck: Normal range of motion. Neck supple. No JVD present. No tracheal deviation present. No thyromegaly present.  Cardiovascular: Normal rate, regular rhythm and normal heart sounds.   Pulmonary/Chest: No stridor. No respiratory distress. She has no wheezes. She has rales.  Abdominal: Soft. Bowel sounds are normal. She exhibits no distension and no mass. There is no tenderness. There is no rebound and no guarding.  Musculoskeletal: She exhibits no edema and no tenderness.  Lymphadenopathy:    She has no cervical adenopathy.  Neurological: She displays  normal reflexes. No cranial nerve deficit. She exhibits normal muscle tone. Coordination normal.  Skin: No rash noted. No erythema.  Psychiatric: She has a normal mood and affect. Her behavior is normal. Judgment and thought content normal.    Lab Results  Component Value Date   WBC 9.7 05/12/2011   HGB 14.1 05/12/2011   HCT 41.1 05/12/2011   PLT 170.0 05/12/2011   CHOL 206* 03/20/2010   TRIG 427.0* 03/20/2010   HDL 33.80* 03/20/2010   LDLDIRECT 124.3 03/20/2010   ALT 18 06/23/2011   AST 37 06/23/2011   NA 134* 06/23/2011   K 3.7 06/23/2011   CL 98 06/23/2011   CREATININE 0.7 06/23/2011   BUN 9 06/23/2011   CO2 25 06/23/2011   TSH 2.09 08/19/2010   INR 1.4* 01/29/2011   HGBA1C 6.4 06/23/2011         Assessment & Plan:

## 2011-06-26 NOTE — Assessment & Plan Note (Signed)
Continue with current prescription therapy as reflected on the Med list.  

## 2011-06-26 NOTE — Assessment & Plan Note (Signed)
Continue with current diet and prescription therapy as reflected on the Med list.

## 2011-06-26 NOTE — Assessment & Plan Note (Signed)
Monitoring

## 2011-06-28 MED ORDER — IBUPROFEN 600 MG PO TABS: 600.0000 mg | ORAL_TABLET | ORAL | Status: DC | PRN

## 2011-06-28 MED ORDER — ESOMEPRAZOLE MAGNESIUM 40 MG PO CPDR: 40.0000 mg | DELAYED_RELEASE_CAPSULE | Freq: Two times a day (BID) | ORAL | Status: DC

## 2011-06-28 MED ORDER — POTASSIUM CHLORIDE 10 MEQ PO TBCR: 20.0000 meq | EXTENDED_RELEASE_TABLET | Freq: Every day | ORAL | Status: DC

## 2011-06-28 MED ORDER — GLIMEPIRIDE 1 MG PO TABS: 1.0000 mg | ORAL_TABLET | Freq: Every day | ORAL | Status: DC

## 2011-06-28 MED ORDER — ESCITALOPRAM OXALATE 20 MG PO TABS: 20.0000 mg | ORAL_TABLET | Freq: Every day | ORAL | Status: DC

## 2011-06-28 MED ORDER — CLONAZEPAM 0.5 MG PO TABS: 1.0000 mg | ORAL_TABLET | Freq: Every evening | ORAL | Status: DC | PRN

## 2011-06-28 MED ORDER — FERROUS SULFATE 325 (65 FE) MG PO TABS: 325.0000 mg | ORAL_TABLET | Freq: Every day | ORAL | Status: DC

## 2011-06-28 MED ORDER — PHENYTOIN SODIUM EXTENDED 100 MG PO CAPS: 100.0000 mg | ORAL_CAPSULE | Freq: Every day | ORAL | Status: DC

## 2011-06-28 MED ORDER — CLOPIDOGREL BISULFATE 75 MG PO TABS: 75.0000 mg | ORAL_TABLET | Freq: Every day | ORAL | Status: DC

## 2011-06-28 MED ORDER — CHOLESTYRAMINE LIGHT 4 G PO PACK: PACK | ORAL | Status: DC

## 2011-06-28 MED ORDER — BUDESONIDE-FORMOTEROL FUMARATE 80-4.5 MCG/ACT IN AERO: 2.0000 | INHALATION_SPRAY | Freq: Two times a day (BID) | RESPIRATORY_TRACT | Status: DC

## 2011-06-28 NOTE — Assessment & Plan Note (Signed)
Continue with current prescription therapy as reflected on the Med list.  

## 2011-06-29 ENCOUNTER — Telehealth: Payer: Self-pay

## 2011-06-29 ENCOUNTER — Telehealth: Payer: Self-pay | Admitting: Internal Medicine

## 2011-06-29 NOTE — Telephone Encounter (Signed)
Received results from C diff by PCR, C Diff was detected. Dr. [name] please advise.

## 2011-06-29 NOTE — Telephone Encounter (Signed)
TRY LOMOTIL 1-2 PO Q 6 HOURS PRN

## 2011-06-29 NOTE — Telephone Encounter (Signed)
Monique Fleming, please, inform patient that her UA was borderline abnormal. We could use an abx if sx's Thx

## 2011-06-30 MED ORDER — SACCHAROMYCES BOULARDII 250 MG PO CAPS: 250.0000 mg | ORAL_CAPSULE | Freq: Two times a day (BID) | ORAL | Status: AC

## 2011-06-30 MED ORDER — VANCOMYCIN HCL 125 MG PO CAPS: ORAL_CAPSULE | ORAL | Status: DC

## 2011-06-30 NOTE — Telephone Encounter (Signed)
Dr. [name] which antibiotic would you like to give the pt?

## 2011-06-30 NOTE — Telephone Encounter (Signed)
Left mess for patient to call back.  

## 2011-06-30 NOTE — Telephone Encounter (Signed)
Spoke with pts daughter and she is aware.   Daughter requests that her mother continue taking the Florastor and would like a script sent to the pharmacy for this so it will be ready along with the vancomycin. Dr. [name] are you ok with her continuing the Florastor?

## 2011-06-30 NOTE — Telephone Encounter (Signed)
Rx sent to pharmacy   

## 2011-06-30 NOTE — Telephone Encounter (Signed)
Linda, I misinterpreted the result. I do NOT want her to take Lomotil. Rather, prescribe vancomycin 125 mg 4 times a day x2 weeks. Have her contact the office in one week with an update. Thanks

## 2011-06-30 NOTE — Telephone Encounter (Signed)
Okay to continue Florastor

## 2011-07-01 ENCOUNTER — Telehealth: Payer: Self-pay | Admitting: Internal Medicine

## 2011-07-01 MED ORDER — ONDANSETRON HCL 4 MG PO TABS: ORAL_TABLET | ORAL | Status: DC

## 2011-07-01 NOTE — Telephone Encounter (Signed)
Per pt the nausea is only with the vancomycin. Pt aware of Dr. Lauro Franklin recommendations and script sent to the pharmacy. Pt knows to call if not better.

## 2011-07-01 NOTE — Telephone Encounter (Signed)
Linda, let's ensure that nausea and vomiting is only related to the medication and not occurring all the time. If this is a constant symptom I recommend she be evaluated either in clinic or in the ER. Given this is a recurrence after being treated with Flagyl in August, vancomycin is likely the best therapy. I recommend a trial of Zofran 4 mg by mouth every 6 hours when necessary nausea. She could take this 30 minutes to one hour prior to her dose of vancomycin. If this does not improve her nausea please have her call back for other options

## 2011-07-01 NOTE — Telephone Encounter (Signed)
Monique Fleming pt that tested positive for c diff, was placed on vancomycin yesterday. Pt has taken 3 doses of the vancomycin and thrown up all of the doses. She had c diff in August and was treated with Flagyl. Dr. Rhea Belton as doc of the afternoon please advise.

## 2011-07-06 NOTE — Telephone Encounter (Signed)
Pt informed. She c/o dysuria(burning). She is on Vancomycin for her C-diff.

## 2011-07-10 ENCOUNTER — Other Ambulatory Visit: Payer: Self-pay | Admitting: *Deleted

## 2011-07-10 LAB — GRAM STAIN

## 2011-07-10 LAB — GLUCOSE, CSF: Glucose, CSF: 61 mg/dL (ref 43–76)

## 2011-07-10 LAB — CBC
HCT: 40.7 % (ref 36.0–46.0)
MCV: 86.1 fL (ref 78.0–100.0)
Platelets: 165 10*3/uL (ref 150–400)
WBC: 12.6 10*3/uL — ABNORMAL HIGH (ref 4.0–10.5)

## 2011-07-10 LAB — CSF CELL COUNT WITH DIFFERENTIAL
Tube #: 4
WBC, CSF: 0 /mm3 (ref 0–5)
WBC, CSF: 3 /mm3 (ref 0–5)

## 2011-07-10 LAB — BASIC METABOLIC PANEL
BUN: 10 mg/dL (ref 6–23)
CO2: 26 mEq/L (ref 19–32)
Chloride: 101 mEq/L (ref 96–112)
Glucose, Bld: 129 mg/dL — ABNORMAL HIGH (ref 70–99)
Potassium: 3.9 mEq/L (ref 3.5–5.1)

## 2011-07-10 LAB — DIFFERENTIAL
Eosinophils Absolute: 0.1 10*3/uL (ref 0.0–0.7)
Eosinophils Relative: 1 % (ref 0–5)
Lymphs Abs: 2.8 10*3/uL (ref 0.7–4.0)

## 2011-07-10 LAB — CSF CULTURE W GRAM STAIN: Culture: NO GROWTH

## 2011-07-10 LAB — PROTIME-INR: Prothrombin Time: 16.8 seconds — ABNORMAL HIGH (ref 11.6–15.2)

## 2011-07-10 LAB — PROTEIN, CSF: Total  Protein, CSF: 37 mg/dL (ref 15–45)

## 2011-07-10 MED ORDER — CLONAZEPAM 0.5 MG PO TABS: 1.0000 mg | ORAL_TABLET | Freq: Every evening | ORAL | Status: DC | PRN

## 2011-07-10 MED ORDER — PHENYTOIN SODIUM EXTENDED 100 MG PO CAPS: 100.0000 mg | ORAL_CAPSULE | Freq: Every day | ORAL | Status: DC

## 2011-07-10 MED ORDER — GLUCOSE BLOOD VI STRP: ORAL_STRIP | Status: DC

## 2011-07-10 MED ORDER — ESOMEPRAZOLE MAGNESIUM 40 MG PO CPDR: 40.0000 mg | DELAYED_RELEASE_CAPSULE | Freq: Two times a day (BID) | ORAL | Status: DC

## 2011-07-10 MED ORDER — GLIMEPIRIDE 1 MG PO TABS: 1.0000 mg | ORAL_TABLET | Freq: Every day | ORAL | Status: DC

## 2011-07-10 MED ORDER — CLOPIDOGREL BISULFATE 75 MG PO TABS: 75.0000 mg | ORAL_TABLET | Freq: Every day | ORAL | Status: DC

## 2011-07-10 MED ORDER — ESCITALOPRAM OXALATE 20 MG PO TABS: 20.0000 mg | ORAL_TABLET | Freq: Every day | ORAL | Status: DC

## 2011-07-10 NOTE — Telephone Encounter (Signed)
Left pt detailed VM

## 2011-07-10 NOTE — Telephone Encounter (Signed)
No need for abx. Use AZO prn (OTC) Thx

## 2011-07-15 ENCOUNTER — Encounter (HOSPITAL_BASED_OUTPATIENT_CLINIC_OR_DEPARTMENT_OTHER): Payer: Medicare Other | Admitting: Hematology & Oncology

## 2011-07-15 ENCOUNTER — Other Ambulatory Visit: Payer: Self-pay | Admitting: Family

## 2011-07-15 DIAGNOSIS — Z85038 Personal history of other malignant neoplasm of large intestine: Secondary | ICD-10-CM

## 2011-07-15 DIAGNOSIS — C189 Malignant neoplasm of colon, unspecified: Secondary | ICD-10-CM

## 2011-07-15 LAB — CBC WITH DIFFERENTIAL (CANCER CENTER ONLY)
BASO#: 0 10*3/uL (ref 0.0–0.2)
Eosinophils Absolute: 0.2 10*3/uL (ref 0.0–0.5)
HCT: 39.8 % (ref 34.8–46.6)
HGB: 14.3 g/dL (ref 11.6–15.9)
LYMPH#: 3 10*3/uL (ref 0.9–3.3)
MCH: 30.6 pg (ref 26.0–34.0)
MONO%: 9.5 % (ref 0.0–13.0)
NEUT#: 7 10*3/uL — ABNORMAL HIGH (ref 1.5–6.5)
RBC: 4.68 10*6/uL (ref 3.70–5.32)

## 2011-07-16 LAB — COMPREHENSIVE METABOLIC PANEL
Albumin: 4.1 g/dL (ref 3.5–5.2)
BUN: 7 mg/dL (ref 6–23)
Calcium: 8.8 mg/dL (ref 8.4–10.5)
Chloride: 103 mEq/L (ref 96–112)
Glucose, Bld: 100 mg/dL — ABNORMAL HIGH (ref 70–99)
Potassium: 4.2 mEq/L (ref 3.5–5.3)

## 2011-07-17 ENCOUNTER — Telehealth: Payer: Self-pay | Admitting: Internal Medicine

## 2011-07-17 DIAGNOSIS — A0472 Enterocolitis due to Clostridium difficile, not specified as recurrent: Secondary | ICD-10-CM

## 2011-07-17 NOTE — Telephone Encounter (Signed)
Caled pt, no answer.  Pt called and states that she has finished her antibiotic for c diff. She was seen by Dr. Gustavo Lah nurse practitioner yesterday and was told that her white count was elevated, she has a lymph node that is a little swollen and a low grade temp. They would not place her on an antibiotic and told her to check with [place]. She states that she has loose stools once in a while but not like it was. Pt wants to know if she needs to have any other medication or another stool test. Dr. [name] please advise.

## 2011-07-18 NOTE — Telephone Encounter (Signed)
WBC only minimally elevated. She has a history of chronic diarrhea for years (unrelated to recent C. Diff). If she feels that she is at her baseline, nothing further needs to be done. If not, I would treat her with 2 additional weeks of antibiotics (as we prescribed last)

## 2011-07-20 NOTE — Telephone Encounter (Signed)
Ok to repeat a stool study for C.diff by PCR

## 2011-07-20 NOTE — Telephone Encounter (Signed)
Pt states that she is about at baseline as far as the bowel movements go but she has started having cramping in the bottom of her stomach similar to menstrual cramps. States that when she presses on her stomach there are tender areas present. Also wants to know if she needs to give another stool specimen to see if the cdiff is gone. Dr. [name] please advise.

## 2011-07-20 NOTE — Telephone Encounter (Signed)
Pt aware.

## 2011-07-20 NOTE — Telephone Encounter (Signed)
Line busy

## 2011-07-21 ENCOUNTER — Telehealth: Payer: Self-pay | Admitting: Internal Medicine

## 2011-07-21 NOTE — Telephone Encounter (Signed)
Pt complaining of pain in the bottom of her stomach, states she cannot take the pain any more and is requesting an appt. Pt scheduled to see Mike Gip PA 07/24/11@2pm . Pt aware of appt date and time.

## 2011-07-22 ENCOUNTER — Encounter (HOSPITAL_BASED_OUTPATIENT_CLINIC_OR_DEPARTMENT_OTHER): Payer: Self-pay

## 2011-07-22 ENCOUNTER — Emergency Department (HOSPITAL_BASED_OUTPATIENT_CLINIC_OR_DEPARTMENT_OTHER)
Admission: EM | Admit: 2011-07-22 | Discharge: 2011-07-22 | Disposition: A | Discharge status: Home / Self Care | Payer: Medicare Other | Attending: Emergency Medicine | Admitting: Emergency Medicine

## 2011-07-22 ENCOUNTER — Telehealth: Payer: Self-pay | Admitting: Physician Assistant

## 2011-07-22 DIAGNOSIS — Z8679 Personal history of other diseases of the circulatory system: Secondary | ICD-10-CM | POA: Insufficient documentation

## 2011-07-22 DIAGNOSIS — N390 Urinary tract infection, site not specified: Secondary | ICD-10-CM

## 2011-07-22 DIAGNOSIS — K589 Irritable bowel syndrome without diarrhea: Secondary | ICD-10-CM | POA: Insufficient documentation

## 2011-07-22 DIAGNOSIS — R109 Unspecified abdominal pain: Secondary | ICD-10-CM | POA: Insufficient documentation

## 2011-07-22 LAB — COMPREHENSIVE METABOLIC PANEL
ALT: 19 U/L (ref 0–35)
Alkaline Phosphatase: 82 U/L (ref 39–117)
CO2: 25 mEq/L (ref 19–32)
GFR calc Af Amer: >90 mL/min (ref >90)
Glucose, Bld: 164 mg/dL — ABNORMAL HIGH (ref 70–99)
Potassium: 3.9 mEq/L (ref 3.5–5.1)
Sodium: 140 mEq/L (ref 135–145)
Total Protein: 7.2 g/dL (ref 6.0–8.3)

## 2011-07-22 LAB — URINALYSIS, ROUTINE W REFLEX MICROSCOPIC
Bilirubin Urine: NEGATIVE
Ketones, ur: NEGATIVE mg/dL
Nitrite: POSITIVE — AB
pH: 6 (ref 5.0–8.0)

## 2011-07-22 LAB — DIFFERENTIAL
Eosinophils Absolute: 0.2 10*3/uL (ref 0.0–0.7)
Lymphocytes Relative: 27 % (ref 12–46)
Lymphs Abs: 2 10*3/uL (ref 0.7–4.0)
Neutrophils Relative %: 58 % (ref 43–77)

## 2011-07-22 LAB — URINE MICROSCOPIC-ADD ON

## 2011-07-22 LAB — CBC
Platelets: 134 10*3/uL — ABNORMAL LOW (ref 150–400)
RBC: 4.29 MIL/uL (ref 3.87–5.11)
WBC: 7.7 10*3/uL (ref 4.0–10.5)

## 2011-07-22 MED ORDER — FLUCONAZOLE 200 MG PO TABS: 200.0000 mg | ORAL_TABLET | Freq: Once | ORAL | Status: AC

## 2011-07-22 MED ORDER — NITROFURANTOIN MONOHYD MACRO 100 MG PO CAPS: 100.0000 mg | ORAL_CAPSULE | Freq: Two times a day (BID) | ORAL | Status: AC

## 2011-07-22 NOTE — Telephone Encounter (Signed)
Received call from Dr. Anitra Lauth at [facility]. Pt went there complaining of pain in her lower abdomen. They obtained a stool specimen for cdiff and sent it to the lab. Pt has a bad UTI according to Dr. Anitra Lauth. Dr. Anitra Lauth is asking what to treat her with for the UTI and if she should tx her for cdiff prophylactically. Spoke with Mike Gip PA and pt should be placed on Macrobid for the UTI and take Florastor BID. Dr. Anitra Lauth notified. Will look for stool results tomorrow.

## 2011-07-22 NOTE — Telephone Encounter (Signed)
Left message for daughter to call back. Also left message on pts home phone number to call back.

## 2011-07-22 NOTE — ED Notes (Signed)
Pt reports low abdominal pain, rectal pressure and urinary symptoms.

## 2011-07-22 NOTE — ED Notes (Signed)
Assessment- Pt reports urinary frequency.

## 2011-07-22 NOTE — Telephone Encounter (Signed)
Stool for c diff was negative

## 2011-07-22 NOTE — ED Provider Notes (Signed)
History     CSN: [id number] Arrival date & time: 07/22/2011 10:53 AM   First MD Initiated Contact with Patient 07/22/11 1100      Chief Complaint  Patient presents with  . Abdominal Pain  . Urinary Frequency    (Consider location/radiation/quality/duration/timing/severity/associated sxs/prior treatment) HPI Comments: Pt recently treated for cdiff off vanc for a week and now new dysuri  Patient is a 77 y.o. female presenting with abdominal pain. The history is provided by the patient and a relative.  Abdominal Pain The primary symptoms of the illness include abdominal pain and dysuria. The primary symptoms of the illness do not include fever, nausea, vomiting or diarrhea. The current episode started 2 days ago. The onset of the illness was gradual. The problem has been gradually worsening.  The dysuria began 2 days ago. The discomfort is moderate. The dysuria is associated with frequency and urgency.  The patient states that she believes she is currently not pregnant. The patient has not had a change in bowel habit. Additional symptoms associated with the illness include urgency and frequency. Symptoms associated with the illness do not include constipation.    Past Medical History  Diagnosis Date  . Aortic stenosis     moderate by echo 6/11 gradient 20/44mmHg for mean and peak  . TIA (transient ischemic attack)     Dr. Pearlean Brownie  . Peripheral vascular disease   . IBS (irritable bowel syndrome)   . Diabetes mellitus     type II  . Chronic bronchitis   . COPD (chronic obstructive pulmonary disease)   . Atherosclerosis   . Depression   . Anxiety     Dr Donell Beers  . Renal cyst   . Cirrhosis   . Seizure disorder   . Anemia     iron deficiency  . Cellulitis   . Diverticular disease   . Infectious diarrhea   . Sinusitis   . Thrush   . Abnormal liver function test   . GERD (gastroesophageal reflux disease)   . Insomnia     chronic  . Adenocarcinoma 2008    colon,s/p right  hemicolectomy    Past Surgical History  Procedure Date  . Carotid endarterectomy   . Colectomy     right hemi  . Cholecystectomy 11/09    Rt Breast    Family History  Problem Relation Age of Onset  . Hypertension    . Colon cancer Paternal Grandmother   . Esophageal cancer Brother   . Heart disease Mother   . Diabetes Brother   . Hypertension Father     History  Substance Use Topics  . Smoking status: Current Everyday Smoker -- 0.5 packs/day    Last Attempt to Quit: 04/14/2011  . Smokeless tobacco: Never Used   Comment: Using Electronic cigarette  . Alcohol Use: No    OB History    Grav Para Term Preterm Abortions TAB SAB Ect Mult Living                  Review of Systems  Constitutional: Negative for fever.  Gastrointestinal: Positive for abdominal pain. Negative for nausea, vomiting, diarrhea and constipation.  Genitourinary: Positive for dysuria, urgency and frequency.       Rectal pain with defecation and hx of hemorroids  All other systems reviewed and are negative.    Allergies  Levofloxacin; Pneumovax; Augmentin; Penicillins; Morphine and related; and Sudafed  Home Medications   Current Outpatient Rx  Name Route Sig Dispense Refill  .  PHENAZOPYRIDINE HCL 95 MG PO TABS Oral Take 95 mg by mouth 3 (three) times daily as needed.      Marland Kitchen SACCHAROMYCES BOULARDII 250 MG PO CAPS Oral Take 250 mg by mouth 2 (two) times daily.      . BUDESONIDE-FORMOTEROL FUMARATE 80-4.5 MCG/ACT IN AERO Inhalation Inhale 2 puffs into the lungs 2 (two) times daily. Rinse mouth 3 Inhaler prn  . VITAMIN D3 1000 UNITS PO TABS Oral Take 1,000 Units by mouth daily.      . CHOLESTYRAMINE LIGHT 4 G PO PACK  For cholesterol and itching Take one packet twice daily for 30 days. Take 2 hours away from other meds. 60 packet 11  . CLONAZEPAM 0.5 MG PO TABS Oral Take 2 tablets (1 mg total) by mouth at bedtime as needed. For sleep 180 tablet 1  . CLOPIDOGREL BISULFATE 75 MG PO TABS Oral Take  1 tablet (75 mg total) by mouth daily. 90 tablet 3  . ESCITALOPRAM OXALATE 20 MG PO TABS Oral Take 1 tablet (20 mg total) by mouth at bedtime. 90 tablet 3  . ESOMEPRAZOLE MAGNESIUM 40 MG PO CPDR Oral Take 1 capsule (40 mg total) by mouth 2 (two) times daily. 180 capsule 3  . FERROUS SULFATE 325 (65 FE) MG PO TABS Oral Take 1 tablet (325 mg total) by mouth daily with breakfast. 90 tablet 3  . FLUCONAZOLE 200 MG PO TABS Oral Take 1 tablet (200 mg total) by mouth once. 1 tablet 0  . GLIMEPIRIDE 1 MG PO TABS Oral Take 1 tablet (1 mg total) by mouth daily before breakfast. 90 tablet 3  . GLUCOSE BLOOD VI STRP  Use as instructed 100 each 3  . IBUPROFEN 600 MG PO TABS Oral Take 1 tablet (600 mg total) by mouth as needed. For pain 90 tablet 3  . NITROFURANTOIN MONOHYD MACRO 100 MG PO CAPS Oral Take 1 capsule (100 mg total) by mouth 2 (two) times daily. 14 capsule 0  . ONDANSETRON HCL 4 MG PO TABS  Take 1 pill every 6 hours as needed for nausea 30 tablet 1  . PHENYTOIN SODIUM EXTENDED 100 MG PO CAPS Oral Take 1 capsule (100 mg total) by mouth daily. Take 2 capsules by mouth at bedtime 180 capsule 3  . POTASSIUM CHLORIDE 10 MEQ PO TBCR Oral Take 2 tablets (20 mEq total) by mouth daily. Take 2 capsules by mouth daily 180 tablet 3  . TRIAMCINOLONE ACETONIDE 0.5 % EX CREA Topical Apply topically 2 (two) times daily as needed.     Marland Kitchen VANCOMYCIN HCL 125 MG PO CAPS  Take 1 tablet by mouth four times/day for 2 weeks. 56 capsule 0    BP 126/48  Pulse 55  Temp(Src) 97.7 F (36.5 C) (Oral)  Resp 18  Ht 5\' 3"  (1.6 m)  Wt 143 lb (64.864 kg)  BMI 25.33 kg/m2  SpO2 97%  Physical Exam  Nursing note and vitals reviewed. Constitutional: She is oriented to person, place, and time. She appears well-developed and well-nourished. No distress.  HENT:  Head: Normocephalic and atraumatic.  Eyes: EOM are normal. Pupils are equal, round, and reactive to light.  Cardiovascular: Normal rate, regular rhythm, normal heart  sounds and intact distal pulses.  Exam reveals no friction rub.   No murmur heard. Pulmonary/Chest: Effort normal and breath sounds normal. She has no wheezes. She has no rales.  Abdominal: Soft. Bowel sounds are normal. She exhibits no distension and no mass. There is tenderness in the  suprapubic area. There is no rebound, no guarding and no CVA tenderness.       Pain on rectal exam with internal hemorrhoids present  Musculoskeletal: Normal range of motion. She exhibits no tenderness.       No edema  Neurological: She is alert and oriented to person, place, and time. No cranial nerve deficit.  Skin: Skin is warm and dry. No rash noted.  Psychiatric: She has a normal mood and affect. Her behavior is normal.    ED Course  Procedures (including critical care time)  Labs Reviewed  CBC - Abnormal; Notable for the following:    Platelets 134 (*)    All other components within normal limits  COMPREHENSIVE METABOLIC PANEL - Abnormal; Notable for the following:    Glucose, Bld 164 (*)    Albumin 3.2 (*)    GFR calc non Af Amer 84 (*)    All other components within normal limits  URINALYSIS, ROUTINE W REFLEX MICROSCOPIC - Abnormal; Notable for the following:    Appearance CLOUDY (*)    Hgb urine dipstick TRACE (*)    Protein, ur 30 (*)    Nitrite POSITIVE (*)    Leukocytes, UA SMALL (*)    All other components within normal limits  URINE MICROSCOPIC-ADD ON - Abnormal; Notable for the following:    Bacteria, UA MANY (*)    All other components within normal limits  DIFFERENTIAL  OCCULT BLOOD X 1 CARD TO LAB, STOOL  CLOSTRIDIUM DIFFICILE BY PCR  URINE CULTURE   No results found.   1. UTI (lower urinary tract infection)       MDM   Pt recently treated for c.diff and finished oral vanc last week and last few days with suprapubic pain and not feeling well with dysuria.  Pt seen by PCP several days ago but did not start new meds.  CBC, BMP wnl.  UA with new UTI and culture sent.  No  abd findings concerning for appy, diverticulitis or other abd pathology.  Pt was having some rectal pain but hemorrhoids present on exam and heme positive however most likely do to hemorrhoids and stable Hb.  Spoke with Dr. Lamar Sprinkles office and they will follow c.diff PCR and will f/u.  Will start macrobid but do not want to restart vanc at this time.        Gwyneth Sprout, MD 07/22/11 [phone]

## 2011-07-22 NOTE — ED Notes (Signed)
MD at bedside. 

## 2011-07-24 ENCOUNTER — Ambulatory Visit: Payer: Medicare Other | Admitting: Physician Assistant

## 2011-07-25 LAB — URINE CULTURE
Colony Count: >=100000
Culture  Setup Time: 201210180217

## 2011-07-26 NOTE — ED Notes (Signed)
+   urine culture. Tx'd with Macrobid, sensitive to same per protocol MD.

## 2011-07-31 ENCOUNTER — Telehealth: Payer: Self-pay | Admitting: *Deleted

## 2011-07-31 ENCOUNTER — Ambulatory Visit (INDEPENDENT_AMBULATORY_CARE_PROVIDER_SITE_OTHER): Payer: Medicare Other | Admitting: Endocrinology

## 2011-07-31 ENCOUNTER — Encounter: Payer: Self-pay | Admitting: Endocrinology

## 2011-07-31 ENCOUNTER — Other Ambulatory Visit: Payer: Medicare Other

## 2011-07-31 VITALS — BP 126/72 | HR 74 | Temp 97.7°F | Ht 65.0 in | Wt 141.0 lb

## 2011-07-31 DIAGNOSIS — A0472 Enterocolitis due to Clostridium difficile, not specified as recurrent: Secondary | ICD-10-CM | POA: Insufficient documentation

## 2011-07-31 DIAGNOSIS — K529 Noninfective gastroenteritis and colitis, unspecified: Secondary | ICD-10-CM

## 2011-07-31 DIAGNOSIS — R109 Unspecified abdominal pain: Secondary | ICD-10-CM

## 2011-07-31 DIAGNOSIS — N390 Urinary tract infection, site not specified: Secondary | ICD-10-CM

## 2011-07-31 DIAGNOSIS — R300 Dysuria: Secondary | ICD-10-CM

## 2011-07-31 LAB — POCT URINALYSIS DIPSTICK
Blood, UA: POSITIVE
Protein, UA: POSITIVE
Spec Grav, UA: 1.025
Urobilinogen, UA: 0.2
pH, UA: 6

## 2011-07-31 NOTE — Patient Instructions (Signed)
Let's check the urine culture Also please stop by the lab to give a stool specimen, so we can retest for "c diff." I hope you feel better soon.  If you don't feel better by next week, please call your doctor.

## 2011-07-31 NOTE — Telephone Encounter (Signed)
Pt left vm yesterday stating she is having terrible abd pains and she states she has a severe UTI.  She was evaluated at ER on 07-22-11 and was given and has completed Macrobid. She states symptoms have not improved/resolved.  I advised her Dr. Posey Rea has no openings. Transferred to scheduler to schedule OV with another MD.

## 2011-07-31 NOTE — Progress Notes (Signed)
Subjective:    Patient ID: Monique Fleming, female    DOB: [birth year], 77 y.o.   MRN: [id number]  HPI Pt has 3  Mos of intermittent moderate crampy-quality pain across the abdomen, but no assoc brbpr. She was seen in er 9 day ago.  c dif had been pos, but test sent from er was neg.   She was also rx'ed for uti.  Dysuria is also unchanged since she was seen in er.   Past Medical History  Diagnosis Date  . Aortic stenosis     moderate by echo 6/11 gradient 20/11mmHg for mean and peak  . TIA (transient ischemic attack)     Dr. Pearlean Brownie  . Peripheral vascular disease   . IBS (irritable bowel syndrome)   . Diabetes mellitus     type II  . Chronic bronchitis   . COPD (chronic obstructive pulmonary disease)   . Atherosclerosis   . Depression   . Anxiety     Dr Donell Beers  . Renal cyst   . Cirrhosis   . Seizure disorder   . Anemia     iron deficiency  . Cellulitis   . Diverticular disease   . Infectious diarrhea   . Sinusitis   . Thrush   . Abnormal liver function test   . GERD (gastroesophageal reflux disease)   . Insomnia     chronic  . Adenocarcinoma 2008    colon,s/p right hemicolectomy    Past Surgical History  Procedure Date  . Carotid endarterectomy   . Colectomy     right hemi  . Cholecystectomy 11/09    Rt Breast    History   Social History  . Marital Status: Widowed    Spouse Name: N/A    Number of Children: N/A  . Years of Education: N/A   Occupational History  . retired    Social History Main Topics  . Smoking status: Current Everyday Smoker -- 0.5 packs/day    Last Attempt to Quit: 04/14/2011  . Smokeless tobacco: Never Used   Comment: Using Electronic cigarette  . Alcohol Use: No  . Drug Use: No  . Sexually Active: No   Other Topics Concern  . Not on file   Social History Narrative   Patient signed a Designated Party Release to allow her daughter, Elizebath Wever, to have access to her medical records/information. Entered by Daphane Shepherd March  24,2011 @ 2:47 pmDailly Caffeine Use    Current Outpatient Prescriptions on File Prior to Visit  Medication Sig Dispense Refill  . budesonide-formoterol (SYMBICORT) 80-4.5 MCG/ACT inhaler Inhale 2 puffs into the lungs 2 (two) times daily. Rinse mouth  3 Inhaler  prn  . Cholecalciferol (VITAMIN D3) 1000 UNITS tablet Take 1,000 Units by mouth daily.        . cholestyramine light (PREVALITE) 4 G packet For cholesterol and itching Take one packet twice daily for 30 days. Take 2 hours away from other meds.  60 packet  11  . clonazePAM (KLONOPIN) 0.5 MG tablet Take 2 tablets (1 mg total) by mouth at bedtime as needed. For sleep  180 tablet  1  . clopidogrel (PLAVIX) 75 MG tablet Take 1 tablet (75 mg total) by mouth daily.  90 tablet  3  . escitalopram (LEXAPRO) 20 MG tablet Take 1 tablet (20 mg total) by mouth at bedtime.  90 tablet  3  . esomeprazole (NEXIUM) 40 MG capsule Take 1 capsule (40 mg total) by mouth 2 (two) times daily.  180 capsule  3  . ferrous sulfate 325 (65 FE) MG tablet Take 1 tablet (325 mg total) by mouth daily with breakfast.  90 tablet  3  . glimepiride (AMARYL) 1 MG tablet Take 1 tablet (1 mg total) by mouth daily before breakfast.  90 tablet  3  . glucose blood test strip Use as instructed  100 each  3  . ibuprofen (ADVIL,MOTRIN) 600 MG tablet Take 1 tablet (600 mg total) by mouth as needed. For pain  90 tablet  3  . ondansetron (ZOFRAN) 4 MG tablet Take 1 pill every 6 hours as needed for nausea  30 tablet  1  . phenytoin (DILANTIN) 100 MG ER capsule Take 1 capsule (100 mg total) by mouth daily. Take 2 capsules by mouth at bedtime  180 capsule  3  . potassium chloride (KLOR-CON) 10 MEQ CR tablet Take 2 tablets (20 mEq total) by mouth daily. Take 2 capsules by mouth daily  180 tablet  3  . saccharomyces boulardii (FLORASTOR) 250 MG capsule Take 250 mg by mouth 2 (two) times daily.        Marland Kitchen triamcinolone (KENALOG) 0.5 % cream Apply topically 2 (two) times daily as needed.         . phenazopyridine (PYRIDIUM) 95 MG tablet Take 95 mg by mouth 3 (three) times daily as needed.        . vancomycin (VANCOCIN) 125 MG capsule Take 1 tablet by mouth four times/day for 2 weeks.  56 capsule  0    Allergies  Allergen Reactions  . Levofloxacin Other (See Comments)    seizure  . Pneumovax (Pneumococcal Polysaccharides) Other (See Comments)    Severe allergy-cellulitis  . Augmentin Other (See Comments)    Patient stated Drs. recommend taking Augmentin due to her PCN allergy.  . Penicillins Rash    unknown  . Morphine And Related     Confused and agitation  . Sudafed (Pseudoephedrine Hcl) Other (See Comments)    seizure    Family History  Problem Relation Age of Onset  . Hypertension    . Colon cancer Paternal Grandmother   . Esophageal cancer Brother   . Heart disease Mother   . Diabetes Brother   . Hypertension Father     BP 126/72  Pulse 74  Temp(Src) 97.7 F (36.5 C) (Oral)  Ht 5\' 5"  (1.651 m)  Wt 141 lb (63.957 kg)  BMI 23.46 kg/m2  SpO2 95%  Review of Systems Denies fever and n/v    Objective:   Physical Exam VITAL SIGNS:  See vs page GENERAL: no distress ABDOMEN: abdomen is soft, nontender.  no hepatosplenomegaly.   not distended.  no hernia  (i reviewed today's ua)    Assessment & Plan:  c diff colitis, ? Persistent H/o ibs.  This may explain her sxs if c diff is neg persistent pyuria

## 2011-08-03 ENCOUNTER — Other Ambulatory Visit: Payer: Self-pay | Admitting: Endocrinology

## 2011-08-03 ENCOUNTER — Telehealth: Payer: Self-pay | Admitting: Internal Medicine

## 2011-08-03 ENCOUNTER — Other Ambulatory Visit: Payer: Self-pay

## 2011-08-03 MED ORDER — SULFAMETHOXAZOLE-TRIMETHOPRIM 400-80 MG PO TABS: 1.0000 | ORAL_TABLET | Freq: Two times a day (BID) | ORAL | Status: DC

## 2011-08-03 NOTE — Telephone Encounter (Signed)
Pt was treated for a UTI 07/22/11 with Macrobid. She was retested for cdiff at that time and was negative. She saw her primary care md on Friday and she still has the UTI and he wants her to take Bactrim. Pt is calling wanting to know if that is ok for her to take due to her history of cdiff. Dr. [name] please advise.

## 2011-08-04 NOTE — Telephone Encounter (Signed)
Pt is aware.  

## 2011-08-04 NOTE — Telephone Encounter (Signed)
She is at risk for recurrent C. Diff, but if she has a bacterial UTI, it needs treated. I recommend taking Florastor one bid while taking antibiotic and 2 weeks after completing antibiotic.

## 2011-08-11 ENCOUNTER — Ambulatory Visit (INDEPENDENT_AMBULATORY_CARE_PROVIDER_SITE_OTHER): Payer: Medicare Other | Admitting: Internal Medicine

## 2011-08-11 ENCOUNTER — Encounter: Payer: Self-pay | Admitting: Internal Medicine

## 2011-08-11 VITALS — BP 130/80 | HR 82 | Ht 65.5 in | Wt 145.8 lb

## 2011-08-11 DIAGNOSIS — J449 Chronic obstructive pulmonary disease, unspecified: Secondary | ICD-10-CM

## 2011-08-11 DIAGNOSIS — J4489 Other specified chronic obstructive pulmonary disease: Secondary | ICD-10-CM

## 2011-08-11 NOTE — Patient Instructions (Signed)
Continue present treatment

## 2011-08-11 NOTE — Assessment & Plan Note (Signed)
She has not experienced a recent exacerbation. It seems likely that  long-term antibiotic therapy given for other reasons, has been protective. We discussed the role of flu and other vaccinations.

## 2011-08-11 NOTE — Progress Notes (Signed)
Patient ID: Monique Fleming, female    DOB: 07/21/1929, 77 y.o.   MRN: [id number]  HPI 89 yoF former smoker with COPD and multiple medical problems.  Last here November 05, 2010. Since then she reports staying off cigarettes and having no significant respiratory events. Pollen bothered more in March with nasal congestion, postnasal drip, but not much wheeze or cough. She feels current meds are adequate.  04/07/11- 81 yoF current smoker with COPD complicated by DM, ASCVD, hx colon cancer, anemia., GERD, esophageal varices. Was hosp for red, inflamed arm after Pneumovax with concern then that she had cellulits. Has had UTI then yeast from antibiotics- to see her GYN Has indigestion with known liver disease and esophageal varices.  Doesn't describe active respiratory complaints- mild occasional cough, dyspnea with exertion unchanged. We talked again about smoking and tried to motivate to full cessation. Smokes a few daily.  06/09/11- 65 yoF current smoker with COPD complicated by DM, ASCVD, hx colon cancer, anemia., GERD, esophageal varices. Reports doing well. Discussed flu shot after her sore arm from the pneumovax, and decided they were different enough that there should not be a similar reaction noted with flu vax.  She has not had significant recent cough, wheeze, phlegm, chest pain or palpitation.   08/11/11-  14 yoF current smoker with COPD complicated by DM, ASCVD, hx colon cancer, anemia., GERD, esophageal varices. She described her problems with bladder infection and C. difficile colitis requiring antibiotics almost continuously for the last 5 months. Her breathing has not changed with weather and season. She gets some raspy cough occasionally but rarely produces sputum. She denies chest pain or palpitation. She is still smoking an occasional cigarette I believe, but much less than before.  Review of Systems See HPI Constitutional:   No weight loss, night sweats,  Fevers, chills, fatigue,  lassitude. HEENT:   No headaches,  Difficulty swallowing,  Tooth/dental problems,  Sore throat,                No sneezing, itching, ear ache, nasal congestion, post nasal drip,  CV:  No chest pain,  Orthopnea, PND, swelling in lower extremities, anasarca, dizziness, palpitations GI  No heartburn, indigestion, abdominal pain, nausea, vomiting, diarrhea, change in bowel habits, loss of appetite Resp: No acute shortness of breath with routine exertion or at rest.  No excess mucus,   No coughing up of blood.  No change in color of mucus.  .Skin: no rash or lesions. GU: no dysuria, change in color of urine, no urgency or frequency.  No flank pain. MS:  No joint pain or swelling.  No decreased range of motion.  No back pain. Psych:  No change in mood or affect. No depression or anxiety.  No memory loss.     Objective:   Physical Exam General- Alert, Oriented, Affect-appropriate, Distress- none acute; bright and talkative today Skin- rash-none, lesions- none, excoriation- none Lymphadenopathy- none Head- atraumatic            Eyes- Gross vision intact, PERRLA, conjunctivae clear secretions            Ears- Hearing, canals-normal            Nose- Clear, no-Septal dev, mucus, polyps, erosion, perforation             Throat- Mallampati II , mucosa clear , drainage- none, tonsils- atrophic Neck- flexible , trachea midline, no stridor , thyroid nl, carotid no bruit Chest - symmetrical excursion , unlabored  Heart/CV- RRR , 1-2/6 systolic precordial murmur , no gallop  , no rub, nl s1 s2                           - JVD- none , edema- none, stasis changes- none, varices- none           Lung- coarse breath sounds with laughter, wheeze- none, cough- none , dullness-none, rub- none           Chest wall-  Abd- tender-no, distended-no, bowel sounds-present, HSM- no Br/ Gen/ Rectal- Not done, not indicated Extrem- cyanosis- none, clubbing, none, atrophy- none, strength- nl Neuro- grossly intact  to observation

## 2011-08-13 ENCOUNTER — Telehealth: Payer: Self-pay | Admitting: *Deleted

## 2011-08-13 ENCOUNTER — Other Ambulatory Visit: Payer: Medicare Other

## 2011-08-13 ENCOUNTER — Other Ambulatory Visit: Payer: Self-pay | Admitting: *Deleted

## 2011-08-13 DIAGNOSIS — A0472 Enterocolitis due to Clostridium difficile, not specified as recurrent: Secondary | ICD-10-CM

## 2011-08-13 LAB — URINALYSIS, ROUTINE W REFLEX MICROSCOPIC
Nitrite: NEGATIVE
Specific Gravity, Urine: 1.02 (ref 1.000–1.030)
Urine Glucose: NEGATIVE

## 2011-08-13 NOTE — Progress Notes (Signed)
Patient having Hematuria w/void. Completed Macrobid Rx prescribed on 07/28/11.

## 2011-08-14 ENCOUNTER — Telehealth: Payer: Self-pay | Admitting: *Deleted

## 2011-08-14 DIAGNOSIS — R310 Gross hematuria: Secondary | ICD-10-CM

## 2011-08-14 LAB — CLOSTRIDIUM DIFFICILE BY PCR: Toxigenic C. Difficile by PCR: NOT DETECTED

## 2011-08-14 NOTE — Telephone Encounter (Signed)
Pt called yesterday c/o hematuria after completing macrobid. Please advise on 08-13-11 UA results. Does she need further meds?

## 2011-08-14 NOTE — Telephone Encounter (Signed)
No. She needs to f/up with urology Thx

## 2011-08-17 NOTE — Telephone Encounter (Signed)
Called pt no answer °

## 2011-08-18 NOTE — Telephone Encounter (Signed)
Pt informed

## 2011-08-18 NOTE — Telephone Encounter (Signed)
Pt informed. She would like to know who you recommend. Please advise

## 2011-08-18 NOTE — Telephone Encounter (Signed)
Dr Retta Diones - done Thx

## 2011-08-19 ENCOUNTER — Telehealth: Payer: Self-pay

## 2011-08-19 NOTE — Telephone Encounter (Signed)
Pt aware.

## 2011-08-19 NOTE — Telephone Encounter (Signed)
Message copied by Michele Mcalpine on Wed Aug 19, 2011  3:35 PM ------      Message from: Hilarie Fredrickson      Created: Wed Aug 19, 2011  3:26 PM       LET PT KNOW THAT HER C. DIFF TOXIN IS NEGATIVE

## 2011-08-21 ENCOUNTER — Telehealth: Payer: Self-pay | Admitting: *Deleted

## 2011-08-21 MED ORDER — FESOTERODINE FUMARATE ER 8 MG PO TB24: 8.0000 mg | ORAL_TABLET | Freq: Every day | ORAL | Status: DC

## 2011-08-21 NOTE — Telephone Encounter (Signed)
Pt's daughter left vm stating pt is having urinary symptoms and is uncomfortable. She states the urology appt is in a month and she needs something for this before then. Please advise.

## 2011-08-21 NOTE — Telephone Encounter (Signed)
Try Toviaz Thx AP

## 2011-08-21 NOTE — Telephone Encounter (Signed)
Pt's daughter informed rx informed.

## 2011-08-27 ENCOUNTER — Emergency Department (INDEPENDENT_AMBULATORY_CARE_PROVIDER_SITE_OTHER): Payer: Medicare Other

## 2011-08-27 ENCOUNTER — Encounter (HOSPITAL_BASED_OUTPATIENT_CLINIC_OR_DEPARTMENT_OTHER): Payer: Self-pay | Admitting: *Deleted

## 2011-08-27 ENCOUNTER — Inpatient Hospital Stay (HOSPITAL_BASED_OUTPATIENT_CLINIC_OR_DEPARTMENT_OTHER)
Admission: EM | Admit: 2011-08-27 | Discharge: 2011-08-28 | DRG: 313 | Disposition: A | Discharge status: Home / Self Care | Payer: Medicare Other | Source: Ambulatory Visit | Attending: Cardiology | Admitting: Cardiology

## 2011-08-27 ENCOUNTER — Other Ambulatory Visit: Payer: Self-pay

## 2011-08-27 DIAGNOSIS — Z87891 Personal history of nicotine dependence: Secondary | ICD-10-CM

## 2011-08-27 DIAGNOSIS — J4489 Other specified chronic obstructive pulmonary disease: Secondary | ICD-10-CM

## 2011-08-27 DIAGNOSIS — Z8673 Personal history of transient ischemic attack (TIA), and cerebral infarction without residual deficits: Secondary | ICD-10-CM

## 2011-08-27 DIAGNOSIS — I359 Nonrheumatic aortic valve disorder, unspecified: Secondary | ICD-10-CM | POA: Diagnosis present

## 2011-08-27 DIAGNOSIS — I350 Nonrheumatic aortic (valve) stenosis: Secondary | ICD-10-CM

## 2011-08-27 DIAGNOSIS — Z880 Allergy status to penicillin: Secondary | ICD-10-CM

## 2011-08-27 DIAGNOSIS — G40909 Epilepsy, unspecified, not intractable, without status epilepticus: Secondary | ICD-10-CM | POA: Diagnosis present

## 2011-08-27 DIAGNOSIS — I10 Essential (primary) hypertension: Secondary | ICD-10-CM | POA: Diagnosis present

## 2011-08-27 DIAGNOSIS — R079 Chest pain, unspecified: Secondary | ICD-10-CM

## 2011-08-27 DIAGNOSIS — Z85038 Personal history of other malignant neoplasm of large intestine: Secondary | ICD-10-CM

## 2011-08-27 DIAGNOSIS — E119 Type 2 diabetes mellitus without complications: Secondary | ICD-10-CM | POA: Diagnosis present

## 2011-08-27 DIAGNOSIS — R011 Cardiac murmur, unspecified: Secondary | ICD-10-CM | POA: Insufficient documentation

## 2011-08-27 DIAGNOSIS — Z8679 Personal history of other diseases of the circulatory system: Secondary | ICD-10-CM | POA: Insufficient documentation

## 2011-08-27 DIAGNOSIS — Z887 Allergy status to serum and vaccine status: Secondary | ICD-10-CM

## 2011-08-27 DIAGNOSIS — Z833 Family history of diabetes mellitus: Secondary | ICD-10-CM

## 2011-08-27 DIAGNOSIS — D509 Iron deficiency anemia, unspecified: Secondary | ICD-10-CM | POA: Diagnosis present

## 2011-08-27 DIAGNOSIS — I7090 Unspecified atherosclerosis: Secondary | ICD-10-CM | POA: Insufficient documentation

## 2011-08-27 DIAGNOSIS — K589 Irritable bowel syndrome without diarrhea: Secondary | ICD-10-CM | POA: Diagnosis present

## 2011-08-27 DIAGNOSIS — R0602 Shortness of breath: Secondary | ICD-10-CM

## 2011-08-27 DIAGNOSIS — F17200 Nicotine dependence, unspecified, uncomplicated: Secondary | ICD-10-CM | POA: Insufficient documentation

## 2011-08-27 DIAGNOSIS — Z7982 Long term (current) use of aspirin: Secondary | ICD-10-CM

## 2011-08-27 DIAGNOSIS — I739 Peripheral vascular disease, unspecified: Secondary | ICD-10-CM | POA: Insufficient documentation

## 2011-08-27 DIAGNOSIS — I2510 Atherosclerotic heart disease of native coronary artery without angina pectoris: Secondary | ICD-10-CM | POA: Diagnosis present

## 2011-08-27 DIAGNOSIS — Z79899 Other long term (current) drug therapy: Secondary | ICD-10-CM

## 2011-08-27 DIAGNOSIS — Z888 Allergy status to other drugs, medicaments and biological substances status: Secondary | ICD-10-CM

## 2011-08-27 DIAGNOSIS — K219 Gastro-esophageal reflux disease without esophagitis: Secondary | ICD-10-CM | POA: Insufficient documentation

## 2011-08-27 DIAGNOSIS — J449 Chronic obstructive pulmonary disease, unspecified: Secondary | ICD-10-CM | POA: Insufficient documentation

## 2011-08-27 DIAGNOSIS — Z8249 Family history of ischemic heart disease and other diseases of the circulatory system: Secondary | ICD-10-CM

## 2011-08-27 DIAGNOSIS — E1159 Type 2 diabetes mellitus with other circulatory complications: Secondary | ICD-10-CM | POA: Insufficient documentation

## 2011-08-27 DIAGNOSIS — K7460 Unspecified cirrhosis of liver: Secondary | ICD-10-CM | POA: Diagnosis present

## 2011-08-27 DIAGNOSIS — G4700 Insomnia, unspecified: Secondary | ICD-10-CM | POA: Diagnosis present

## 2011-08-27 DIAGNOSIS — F411 Generalized anxiety disorder: Secondary | ICD-10-CM | POA: Insufficient documentation

## 2011-08-27 DIAGNOSIS — R0789 Other chest pain: Principal | ICD-10-CM | POA: Diagnosis present

## 2011-08-27 DIAGNOSIS — F341 Dysthymic disorder: Secondary | ICD-10-CM | POA: Diagnosis present

## 2011-08-27 DIAGNOSIS — Z7902 Long term (current) use of antithrombotics/antiplatelets: Secondary | ICD-10-CM

## 2011-08-27 DIAGNOSIS — Z800 Family history of malignant neoplasm of digestive organs: Secondary | ICD-10-CM

## 2011-08-27 HISTORY — DX: Angina pectoris, unspecified: I20.9

## 2011-08-27 HISTORY — DX: Unspecified cirrhosis of liver: K74.60

## 2011-08-27 HISTORY — DX: Unspecified convulsions: R56.9

## 2011-08-27 LAB — CBC
HCT: 36.8 % (ref 36.0–46.0)
HCT: 37.4 % (ref 36.0–46.0)
MCH: 29.1 pg (ref 26.0–34.0)
MCH: 29.3 pg (ref 26.0–34.0)
MCHC: 34 g/dL (ref 30.0–36.0)
MCV: 86.2 fL (ref 78.0–100.0)
RBC: 4.34 MIL/uL (ref 3.87–5.11)
RDW: 14.1 % (ref 11.5–15.5)
WBC: 8.9 10*3/uL (ref 4.0–10.5)

## 2011-08-27 LAB — CARDIAC PANEL(CRET KIN+CKTOT+MB+TROPI)
CK, MB: 4.4 ng/mL — ABNORMAL HIGH (ref 0.3–4.0)
CK, MB: 4.4 ng/mL — ABNORMAL HIGH (ref 0.3–4.0)
CK, MB: 4.7 ng/mL — ABNORMAL HIGH (ref 0.3–4.0)
CK, MB: 4.3 ng/mL — ABNORMAL HIGH (ref 0.3–4.0)
Relative Index: 4 — ABNORMAL HIGH (ref 0.0–2.5)
Total CK: 111 U/L (ref 7–177)
Total CK: 108 U/L (ref 7–177)
Troponin I: <0.3 ng/mL (ref <0.30)
Troponin I: <0.3 ng/mL (ref <0.30)
Troponin I: <0.3 ng/mL (ref <0.30)
Troponin I: <0.3 ng/mL (ref <0.30)

## 2011-08-27 LAB — DIFFERENTIAL
Basophils Absolute: 0 10*3/uL (ref 0.0–0.1)
Basophils Relative: 0 % (ref 0–1)
Eosinophils Absolute: 0.2 10*3/uL (ref 0.0–0.7)
Monocytes Absolute: 1 10*3/uL (ref 0.1–1.0)
Monocytes Relative: 11 % (ref 3–12)
Neutro Abs: 5.4 10*3/uL (ref 1.7–7.7)
Neutrophils Relative %: 60 % (ref 43–77)

## 2011-08-27 LAB — COMPREHENSIVE METABOLIC PANEL
AST: 40 U/L — ABNORMAL HIGH (ref 0–37)
Albumin: 3.4 g/dL — ABNORMAL LOW (ref 3.5–5.2)
BUN: 10 mg/dL (ref 6–23)
Chloride: 105 mEq/L (ref 96–112)
Creatinine, Ser: 0.8 mg/dL (ref 0.50–1.10)
Total Bilirubin: 0.3 mg/dL (ref 0.3–1.2)
Total Protein: 7.5 g/dL (ref 6.0–8.3)

## 2011-08-27 LAB — URINALYSIS, ROUTINE W REFLEX MICROSCOPIC
Ketones, ur: NEGATIVE mg/dL
Nitrite: POSITIVE — AB
Protein, ur: 30 mg/dL — AB
Urobilinogen, UA: 0.2 mg/dL (ref 0.0–1.0)

## 2011-08-27 LAB — GLUCOSE, CAPILLARY
Glucose-Capillary: 92 mg/dL (ref 70–99)
Glucose-Capillary: 93 mg/dL (ref 70–99)

## 2011-08-27 LAB — URINE MICROSCOPIC-ADD ON

## 2011-08-27 LAB — CREATININE, SERUM: GFR calc Af Amer: >90 mL/min (ref >90)

## 2011-08-27 MED ORDER — ESCITALOPRAM OXALATE 20 MG PO TABS
20.0000 mg | ORAL_TABLET | Freq: Every day | ORAL | Status: DC
  Administered 2011-08-28: 20 mg via ORAL
  Filled 2011-08-27: qty 1

## 2011-08-27 MED ORDER — ASPIRIN 300 MG RE SUPP
300.0000 mg | RECTAL | Status: AC
  Filled 2011-08-27: qty 1

## 2011-08-27 MED ORDER — NITROGLYCERIN 0.4 MG SL SUBL
0.4000 mg | SUBLINGUAL_TABLET | SUBLINGUAL | Status: DC | PRN
  Filled 2011-08-27: qty 75

## 2011-08-27 MED ORDER — AMLODIPINE BESYLATE 2.5 MG PO TABS
2.5000 mg | ORAL_TABLET | Freq: Every day | ORAL | Status: DC
  Administered 2011-08-27 – 2011-08-28 (×2): 2.5 mg via ORAL
  Filled 2011-08-27 (×2): qty 1

## 2011-08-27 MED ORDER — ACETAMINOPHEN 325 MG PO TABS: 650.0000 mg | ORAL_TABLET | ORAL | Status: DC | PRN

## 2011-08-27 MED ORDER — ASPIRIN EC 81 MG PO TBEC
81.0000 mg | DELAYED_RELEASE_TABLET | Freq: Every day | ORAL | Status: DC
  Administered 2011-08-28: 81 mg via ORAL
  Filled 2011-08-27: qty 1

## 2011-08-27 MED ORDER — ALPRAZOLAM 0.25 MG PO TABS
0.2500 mg | ORAL_TABLET | Freq: Two times a day (BID) | ORAL | Status: DC | PRN
  Administered 2011-08-27: 0.25 mg via ORAL
  Filled 2011-08-27: qty 1

## 2011-08-27 MED ORDER — CLOPIDOGREL BISULFATE 75 MG PO TABS
75.0000 mg | ORAL_TABLET | Freq: Every day | ORAL | Status: DC
Start: 2011-08-28 — End: 2011-08-28
  Administered 2011-08-28: 75 mg via ORAL
  Filled 2011-08-27 (×3): qty 1

## 2011-08-27 MED ORDER — CLONAZEPAM 0.5 MG PO TABS
0.5000 mg | ORAL_TABLET | Freq: Every day | ORAL | Status: DC
  Administered 2011-08-28: 0.5 mg via ORAL
  Filled 2011-08-27: qty 1

## 2011-08-27 MED ORDER — SODIUM CHLORIDE 0.9 % IJ SOLN
3.0000 mL | Freq: Two times a day (BID) | INTRAMUSCULAR | Status: DC
  Administered 2011-08-27 (×2): 3 mL via INTRAVENOUS

## 2011-08-27 MED ORDER — PHENYTOIN SODIUM EXTENDED 100 MG PO CAPS
200.0000 mg | ORAL_CAPSULE | Freq: Every day | ORAL | Status: DC
  Administered 2011-08-28: 200 mg via ORAL
  Filled 2011-08-27 (×2): qty 2

## 2011-08-27 MED ORDER — HEPARIN SODIUM (PORCINE) 5000 UNIT/ML IJ SOLN
5000.0000 [IU] | Freq: Three times a day (TID) | INTRAMUSCULAR | Status: DC
  Administered 2011-08-27 – 2011-08-28 (×3): 5000 [IU] via SUBCUTANEOUS
  Filled 2011-08-27 (×6): qty 1

## 2011-08-27 MED ORDER — ROSUVASTATIN CALCIUM 20 MG PO TABS
20.0000 mg | ORAL_TABLET | Freq: Every day | ORAL | Status: DC
  Administered 2011-08-27 – 2011-08-28 (×2): 20 mg via ORAL
  Filled 2011-08-27 (×2): qty 1

## 2011-08-27 MED ORDER — ONDANSETRON HCL 4 MG/2ML IJ SOLN: 4.0000 mg | Freq: Four times a day (QID) | INTRAMUSCULAR | Status: DC | PRN

## 2011-08-27 MED ORDER — METOPROLOL TARTRATE 1 MG/ML IV SOLN: 5.0000 mg | Freq: Once | INTRAVENOUS | Status: DC

## 2011-08-27 MED ORDER — ASPIRIN 81 MG PO CHEW
324.0000 mg | CHEWABLE_TABLET | Freq: Once | ORAL | Status: DC
  Filled 2011-08-27: qty 4

## 2011-08-27 MED ORDER — SODIUM CHLORIDE 0.9 % IJ SOLN: 3.0000 mL | INTRAMUSCULAR | Status: DC | PRN

## 2011-08-27 NOTE — ED Notes (Signed)
Pt has an hx of COPD and smokes a half a pack a day. Pt has been smoking for 70 years. Pt states that she uses an HHN machine with Albuterol BID. Pt has insp_exp wheeze on ED visit.

## 2011-08-27 NOTE — H&P (Signed)
MRN: [id number], DOB/AGE: [birth year] 77 y.o. Date of Encounter: 08/27/2011  Primary Physician: Sonda Primes, MD, MD Primary Cardiologist: Charlton Haws  Chief Complaint: Chest pain   HPI:Monique Fleming is a 77 yo WF who woke up at 0200 with substernal CP into neck and jaws with SOB. She does not carry NTG. The discomfort comes and goes. No history of CAD but does have moderate AS that is stable by Echo this past summer. She has diffuse atherosclerotic disease in her thoracic aorta and a history of a left carotid endarterectomy. She has severe COPD and just quit smoking 7 days ago.  In ED did not have chest discomfort so no SL NTG. She did receive ASA. She is transferred from [facility] ED and is pain free.  She denies orthopnea, PND or edema. She has chronic intermittent palpitations. She denies exertional chest pain but has chronic DOE.   Past Medical History  Diagnosis Date  . Aortic stenosis     moderate by echo 6/11 gradient 20/100mmHg for mean and peak  . TIA (transient ischemic attack)     Dr. Pearlean Brownie  . Peripheral vascular disease   . IBS (irritable bowel syndrome)   . Diabetes mellitus     type II  . Chronic bronchitis   . COPD (chronic obstructive pulmonary disease)   . Atherosclerosis   . Depression   . Anxiety     Dr Donell Beers  . Renal cyst   . Cirrhosis   . Seizure disorder   . Anemia     iron deficiency  . Cellulitis   . Diverticular disease   . Infectious diarrhea   . Sinusitis   . Thrush   . Abnormal liver function test   . GERD (gastroesophageal reflux disease)   . Insomnia     chronic  . Adenocarcinoma 2008    colon,s/p right hemicolectomy     Surgical History:  Past Surgical History  Procedure Date  . Carotid endarterectomy   . Colectomy     right hemi  . Cholecystectomy 11/09    Rt Breast     Prescriptions prior to admission  Medication Sig Dispense Refill  . budesonide-formoterol (SYMBICORT) 80-4.5 MCG/ACT inhaler Inhale 2 puffs into the lungs 2  (two) times daily. Rinse mouth  3 Inhaler  prn  . Cholecalciferol (VITAMIN D3) 1000 UNITS tablet Take 1,000 Units by mouth daily.        . cholestyramine light (PREVALITE) 4 G packet For cholesterol and itching Take one packet twice daily for 30 days. Take 2 hours away from other meds.  60 packet  11  . clonazePAM (KLONOPIN) 0.5 MG tablet Take 2 tablets (1 mg total) by mouth at bedtime as needed. For sleep  180 tablet  1  . clopidogrel (PLAVIX) 75 MG tablet Take 1 tablet (75 mg total) by mouth daily.  90 tablet  3  . escitalopram (LEXAPRO) 20 MG tablet Take 1 tablet (20 mg total) by mouth at bedtime.  90 tablet  3  . esomeprazole (NEXIUM) 40 MG capsule Take 1 capsule (40 mg total) by mouth 2 (two) times daily.  180 capsule  3  . ferrous sulfate 325 (65 FE) MG tablet Take 1 tablet (325 mg total) by mouth daily with breakfast.  90 tablet  3  . Fesoterodine Fumarate 8 MG TB24 Take 1 tablet (8 mg total) by mouth daily.  30 tablet  5  . glimepiride (AMARYL) 1 MG tablet Take 1 tablet (1 mg total) by  mouth daily before breakfast.  90 tablet  3  . glucose blood test strip Use as instructed  100 each  3  . ibuprofen (ADVIL,MOTRIN) 600 MG tablet Take 1 tablet (600 mg total) by mouth as needed. For pain  90 tablet  3  . ondansetron (ZOFRAN) 4 MG tablet Take 1 pill every 6 hours as needed for nausea  30 tablet  1  . phenytoin (DILANTIN) 100 MG ER capsule Take 1 capsule (100 mg total) by mouth daily. Take 2 capsules by mouth at bedtime  180 capsule  3  . potassium chloride (KLOR-CON) 10 MEQ CR tablet Take 2 tablets (20 mEq total) by mouth daily. Take 2 capsules by mouth daily  180 tablet  3  . saccharomyces boulardii (FLORASTOR) 250 MG capsule Take 250 mg by mouth 2 (two) times daily.        . phenazopyridine (PYRIDIUM) 95 MG tablet Take 95 mg by mouth 3 (three) times daily as needed.          Allergies:  Allergies  Allergen Reactions  . Levofloxacin Other (See Comments)    seizure  . Pneumovax  (Pneumococcal Polysaccharides) Other (See Comments)    Severe allergy-cellulitis  . Augmentin Other (See Comments)    Patient stated Drs. recommend taking Augmentin due to her PCN allergy.  . Penicillins Rash    unknown  . Morphine And Related     Confused and agitation  . Sudafed (Pseudoephedrine Hcl) Other (See Comments)    seizure    History   Social History  . Marital Status: Widowed    Spouse Name: N/A    Number of Children: N/A  . Years of Education: N/A   Occupational History  . retired    Social History Main Topics  . Smoking status: Former Smoker -- 0.5 packs/day    Quit date: 04/14/2011  . Smokeless tobacco: Never Used   Comment: Using Electronic cigarette  . Alcohol Use: No  . Drug Use: No  . Sexually Active: No   Other Topics Concern  . Not on file   Social History Narrative   Patient signed a Designated Party Release to allow her daughter, Monique Fleming, to have access to her medical records/information. Entered by Daphane Shepherd March 24,2011 @ 2:47 pmDailly Caffeine Use     Family History  Problem Relation Age of Onset  . Hypertension    . Colon cancer Paternal Grandmother   . Esophageal cancer Brother   . Heart disease Mother   . Diabetes Brother   . Hypertension Father     Review of Systems: General: negative for chills, fever, night sweats or weight changes.  Cardiovascular: negative for chest pain, dyspnea on exertion, edema, orthopnea, palpitations, paroxysmal nocturnal dyspnea or shortness of breath Dermatological: negative for rash Respiratory: negative for cough or wheezing Urologic: negative for hematuria Abdominal: negative for nausea, vomiting, diarrhea, bright red blood per rectum, melena, or hematemesis Neurologic: negative for visual changes, syncope, or dizziness All other systems reviewed and are otherwise negative except as noted above.  Labs:   Lab Results  Component Value Date   WBC 8.9 08/27/2011   HGB 12.5 08/27/2011    HCT 36.8 08/27/2011   MCV 85.6 08/27/2011   PLT 150 08/27/2011    Lab 08/27/11 0434  NA 139  K 3.7  CL 105  CO2 24  BUN 10  CREATININE 0.80  CALCIUM 8.9  PROT 7.5  BILITOT 0.3  ALKPHOS 89  ALT 23  AST  40*  GLUCOSE 99   Lab Results  Component Value Date   CKTOTAL 111 08/27/2011   CKMB 4.4* 08/27/2011   TROPONINI <0.30 08/27/2011    Lab Results  Component Value Date   CHOL 206* 03/20/2010   CHOL 139 11/08/2008   Lab Results  Component Value Date   HDL 33.80* 03/20/2010   HDL 19.3* 11/08/2008   Lab Results  Component Value Date   LDLCALC 92 11/08/2008   Lab Results  Component Value Date   TRIG 427.0* 03/20/2010   TRIG 138 11/08/2008   Lab Results  Component Value Date   CHOLHDL 6 03/20/2010   CHOLHDL 7.2 CALC 11/08/2008   Lab Results  Component Value Date   LDLDIRECT 124.3 03/20/2010    No results found for this basename: DDIMER    Radiology/Studies: Dg Chest 2 View  08/27/2011  *RADIOLOGY REPORT*  Clinical Data: Chest pain, shortness of breath  CHEST - 2 VIEW  Comparison: 04/16/2011  Findings: Mild bullous changes.  Mild left lung base opacity is likely scarring or atelectasis.  Otherwise, no focal areas of consolidation.  No pleural effusion or pneumothorax.  No acute osseous abnormality. Surgical clips right upper quadrant.  IMPRESSION: Emphysematous changes and mild left lower lobe opacity, likely scarring or atelectasis. No acute cardiopulmonary process identified.  Original Report Authenticated By: Waneta Martins, M.D.     EKG: NSR, nonspecific ST changes, no acute changes  Physical Exam: Blood pressure 128/70, pulse 64, temperature 97.7 F (36.5 C), temperature source Oral, resp. rate 18, height 5' 5.5" (1.664 m), weight 64 kg (141 lb 1.5 oz), SpO2 93.00%.   General: Well developed, well nourished, in no acute distress, chronically ill, frail, HOH Head: Normocephalic, atraumatic, sclera non-icteric, no xanthomas, nares are without discharge.  Neck:  Bilateral carotid bruits JVD not elevated. Lungs: INSPIRATORY WHEEZES Heart: RRR with soft S1 and S2, 2/6 AS murmur RUSB Abdomen: Soft, non-tender, non-distended with normoactive bowel sounds. No hepatomegaly. No rebound/guarding. No obvious abdominal masses. Msk:  Strength and tone appears normal for age. Extremities: No clubbing, cyanosis or edema.  Distal pedal pulses are 2+ and equal bilaterally, diffuse superficial varicosities Neuro: Alert and oriented X 3. Moves all extremities spontaneously. Psych:  Responds to questions appropriately with a normal affect.  ASSESSMENT AND PLAN Primary Problem Chest Pain at Rest Delightful yet anxious elderly lady with chest discomfort which sounds like coronary ischemia. She has a diffuse vasculopathy so no doubt she has CAD. Will treat medically id rules out and send on with SL NTG. I have reviewed with her how to use this to hopefully avoid MI and visits to ED. Tomorrow is her [place] Ouch so will try our best to safely discharge her tomorrow.  Valera Castle, MD 08/27/2011, 10:20 AM

## 2011-08-27 NOTE — Progress Notes (Signed)
Pt converted into A-Fib. EKG shows heart rhythm going back and forth between A-Fib and Sinus. Vital signs were 97.2 64 20 134/54 95 on RA. Pt complained of SOB. 4L of O2 was applied. Physician was notified, will continue to monitor.    Kebrina Friend Diona Foley, RN

## 2011-08-27 NOTE — ED Provider Notes (Signed)
History     CSN: [id number] Arrival date & time: 08/27/2011  4:06 AM   First MD Initiated Contact with Patient 08/27/11 0421      Chief Complaint  Patient presents with  . Chest Pain    (Consider location/radiation/quality/duration/timing/severity/associated sxs/prior treatment) Patient is a 77 y.o. female presenting with chest pain. The history is provided by the patient.  Chest Pain The chest pain began less than 1 hour ago. Duration of episode(s) is 30 minutes. Chest pain occurs constantly. The chest pain is resolved. Associated with: nothing. At its most intense, the pain is at 8/10. The pain is currently at 0/10. The severity of the pain is severe. The quality of the pain is described as aching, heavy and sharp. The pain radiates to the upper back. Exacerbated by: nothing. Primary symptoms include shortness of breath, palpitations and nausea. Pertinent negatives for primary symptoms include no fever, no cough, no wheezing, no vomiting and no dizziness.  The palpitations also occurred with shortness of breath. The palpitations did not occur with dizziness.   Pertinent negatives for associated symptoms include no orthopnea, no paroxysmal nocturnal dyspnea and no weakness. She tried nothing for the symptoms. Risk factors include being elderly.  Her past medical history is significant for CAD, COPD and hypertension.  Pertinent negatives for past medical history include no CHF and no diabetes.     Past Medical History  Diagnosis Date  . Aortic stenosis     moderate by echo 6/11 gradient 20/53mmHg for mean and peak  . TIA (transient ischemic attack)     Dr. Pearlean Brownie  . Peripheral vascular disease   . IBS (irritable bowel syndrome)   . Diabetes mellitus     type II  . Chronic bronchitis   . COPD (chronic obstructive pulmonary disease)   . Atherosclerosis   . Depression   . Anxiety     Dr Donell Beers  . Renal cyst   . Cirrhosis   . Seizure disorder   . Anemia     iron deficiency    . Cellulitis   . Diverticular disease   . Infectious diarrhea   . Sinusitis   . Thrush   . Abnormal liver function test   . GERD (gastroesophageal reflux disease)   . Insomnia     chronic  . Adenocarcinoma 2008    colon,s/p right hemicolectomy    Past Surgical History  Procedure Date  . Carotid endarterectomy   . Colectomy     right hemi  . Cholecystectomy 11/09    Rt Breast    Family History  Problem Relation Age of Onset  . Hypertension    . Colon cancer Paternal Grandmother   . Esophageal cancer Brother   . Heart disease Mother   . Diabetes Brother   . Hypertension Father     History  Substance Use Topics  . Smoking status: Former Smoker -- 0.5 packs/day    Quit date: 04/14/2011  . Smokeless tobacco: Never Used   Comment: Using Electronic cigarette  . Alcohol Use: No    OB History    Grav Para Term Preterm Abortions TAB SAB Ect Mult Living                  Review of Systems  Constitutional: Negative for fever.  Respiratory: Positive for shortness of breath. Negative for cough and wheezing.   Cardiovascular: Positive for chest pain and palpitations. Negative for orthopnea.  Gastrointestinal: Positive for nausea. Negative for vomiting.  Neurological: Negative  for dizziness and weakness.  All other systems reviewed and are negative.    Allergies  Levofloxacin; Pneumovax; Augmentin; Penicillins; Morphine and related; and Sudafed  Home Medications   Current Outpatient Rx  Name Route Sig Dispense Refill  . BUDESONIDE-FORMOTEROL FUMARATE 80-4.5 MCG/ACT IN AERO Inhalation Inhale 2 puffs into the lungs 2 (two) times daily. Rinse mouth 3 Inhaler prn  . VITAMIN D3 1000 UNITS PO TABS Oral Take 1,000 Units by mouth daily.      . CHOLESTYRAMINE LIGHT 4 G PO PACK  For cholesterol and itching Take one packet twice daily for 30 days. Take 2 hours away from other meds. 60 packet 11  . CLONAZEPAM 0.5 MG PO TABS Oral Take 2 tablets (1 mg total) by mouth at  bedtime as needed. For sleep 180 tablet 1  . CLOPIDOGREL BISULFATE 75 MG PO TABS Oral Take 1 tablet (75 mg total) by mouth daily. 90 tablet 3  . ESCITALOPRAM OXALATE 20 MG PO TABS Oral Take 1 tablet (20 mg total) by mouth at bedtime. 90 tablet 3  . ESOMEPRAZOLE MAGNESIUM 40 MG PO CPDR Oral Take 1 capsule (40 mg total) by mouth 2 (two) times daily. 180 capsule 3  . FERROUS SULFATE 325 (65 FE) MG PO TABS Oral Take 1 tablet (325 mg total) by mouth daily with breakfast. 90 tablet 3  . FESOTERODINE FUMARATE 8 MG PO TB24 Oral Take 1 tablet (8 mg total) by mouth daily. 30 tablet 5  . GLIMEPIRIDE 1 MG PO TABS Oral Take 1 tablet (1 mg total) by mouth daily before breakfast. 90 tablet 3  . GLUCOSE BLOOD VI STRP  Use as instructed 100 each 3  . IBUPROFEN 600 MG PO TABS Oral Take 1 tablet (600 mg total) by mouth as needed. For pain 90 tablet 3  . ONDANSETRON HCL 4 MG PO TABS  Take 1 pill every 6 hours as needed for nausea 30 tablet 1  . PHENAZOPYRIDINE HCL 95 MG PO TABS Oral Take 95 mg by mouth 3 (three) times daily as needed.      Marland Kitchen PHENYTOIN SODIUM EXTENDED 100 MG PO CAPS Oral Take 1 capsule (100 mg total) by mouth daily. Take 2 capsules by mouth at bedtime 180 capsule 3  . POTASSIUM CHLORIDE 10 MEQ PO TBCR Oral Take 2 tablets (20 mEq total) by mouth daily. Take 2 capsules by mouth daily 180 tablet 3  . SACCHAROMYCES BOULARDII 250 MG PO CAPS Oral Take 250 mg by mouth 2 (two) times daily.        BP 163/74  Pulse 56  Temp(Src) 97.7 F (36.5 C) (Oral)  Resp 15  SpO2 97%  Physical Exam  Nursing note and vitals reviewed. Constitutional: She is oriented to person, place, and time. She appears well-developed and well-nourished. No distress.  HENT:  Head: Normocephalic and atraumatic.  Eyes: EOM are normal. Pupils are equal, round, and reactive to light.  Cardiovascular: Normal rate, regular rhythm, normal heart sounds and intact distal pulses.  Exam reveals no friction rub.   No murmur  heard. Pulmonary/Chest: Effort normal and breath sounds normal. No respiratory distress. She has no wheezes. She has no rales. She exhibits no tenderness.  Abdominal: Soft. Bowel sounds are normal. She exhibits no distension. There is tenderness in the epigastric area. There is no rebound and no guarding.       Mild epigastric tenderness  Musculoskeletal: Normal range of motion. She exhibits no tenderness.       No edema  Neurological: She is alert and oriented to person, place, and time. No cranial nerve deficit.  Skin: Skin is warm and dry. No rash noted.  Psychiatric: She has a normal mood and affect. Her behavior is normal.    ED Course  Procedures (including critical care time)  Labs Reviewed  COMPREHENSIVE METABOLIC PANEL - Abnormal; Notable for the following:    Albumin 3.4 (*)    AST 40 (*)    GFR calc non Af Amer 68 (*)    GFR calc Af Amer 79 (*)    All other components within normal limits  CARDIAC PANEL(CRET KIN+CKTOT+MB+TROPI) - Abnormal; Notable for the following:    CK, MB 4.4 (*)    Relative Index 4.0 (*)    All other components within normal limits  CBC  DIFFERENTIAL  URINALYSIS, ROUTINE W REFLEX MICROSCOPIC   Dg Chest 2 View  08/27/2011  *RADIOLOGY REPORT*  Clinical Data: Chest pain, shortness of breath  CHEST - 2 VIEW  Comparison: 04/16/2011  Findings: Mild bullous changes.  Mild left lung base opacity is likely scarring or atelectasis.  Otherwise, no focal areas of consolidation.  No pleural effusion or pneumothorax.  No acute osseous abnormality. Surgical clips right upper quadrant.  IMPRESSION: Emphysematous changes and mild left lower lobe opacity, likely scarring or atelectasis. No acute cardiopulmonary process identified.  Original Report Authenticated By: Waneta Martins, M.D.    Date: 08/27/2011  Rate: 62  Rhythm: normal sinus rhythm  QRS Axis: normal  Intervals: normal  ST/T Wave abnormalities: nonspecific T wave changes  Conduction  Disutrbances:none  Narrative Interpretation:   Old EKG Reviewed: unchanged    No diagnosis found.    MDM   Pt with left sided chest pain that started several days ago intermittently but woke her up tonight with SOB, and some nausea off and on.  Pt denies any infectious sx and states has not had problems with her COPD.  No change in sputum.  Hx of cardiac work up in July of this year with stress test and found that she had mild blockage per daughter and was started on plavix but no stents placed. Low concern for PE and CXR neg for acute change.  Does not sound infectious.  Concern for ACS. Pt given ASA here but due to no pain no NTG given. Labs with normal CBC and  CXR.  CK-MB mildly elevated but troponin neg. [facility] for r/o.        Gwyneth Sprout, MD 08/27/11 [phone]

## 2011-08-27 NOTE — ED Notes (Signed)
Attempted to call report to Gallina, RN on unit at [facility]. Nurse busy and will return call.

## 2011-08-27 NOTE — ED Notes (Signed)
MD at bedside. 

## 2011-08-27 NOTE — ED Notes (Signed)
Pt resting with eyes closed, SR on monitor, resps even and unlabored. Pt waiting on transport to inpt bed at [facility].

## 2011-08-27 NOTE — ED Notes (Signed)
Pt's daughter states that pt is currently on abt for UTI but unsure of which one.

## 2011-08-27 NOTE — ED Notes (Signed)
Pt ambulated to restroom and back to stretcher. Pt became short of breath with exertion. Pt requested oxygen. Nasal cannula placed at 2L. Pt O2 sats 97% on RA and 100% with 2L.

## 2011-08-27 NOTE — ED Notes (Signed)
Pt was given 324mg  ASA by EMS enroute to hospital.

## 2011-08-27 NOTE — ED Notes (Signed)
Called report to Theresa, RN on unit. [facility] room 2034

## 2011-08-27 NOTE — ED Notes (Signed)
Patient transported to X-ray 

## 2011-08-28 ENCOUNTER — Other Ambulatory Visit: Payer: Self-pay

## 2011-08-28 LAB — GLUCOSE, CAPILLARY: Glucose-Capillary: 99 mg/dL (ref 70–99)

## 2011-08-28 MED ORDER — AMLODIPINE BESYLATE 2.5 MG PO TABS: 2.5000 mg | ORAL_TABLET | Freq: Every day | ORAL | Status: DC

## 2011-08-28 MED ORDER — NITROGLYCERIN 0.4 MG SL SUBL: 0.4000 mg | SUBLINGUAL_TABLET | SUBLINGUAL | Status: DC | PRN

## 2011-08-28 MED ORDER — ROSUVASTATIN CALCIUM 20 MG PO TABS: 20.0000 mg | ORAL_TABLET | Freq: Every day | ORAL | Status: DC

## 2011-08-28 MED ORDER — PANTOPRAZOLE SODIUM 40 MG PO TBEC: 40.0000 mg | DELAYED_RELEASE_TABLET | Freq: Every day | ORAL | Status: DC

## 2011-08-28 MED ORDER — IBUPROFEN 600 MG PO TABS: 600.0000 mg | ORAL_TABLET | ORAL | Status: DC | PRN

## 2011-08-28 MED ORDER — PHENAZOPYRIDINE HCL 95 MG PO TABS: 95.0000 mg | ORAL_TABLET | Freq: Three times a day (TID) | ORAL | Status: DC | PRN

## 2011-08-28 MED ORDER — ASPIRIN 81 MG PO TBEC: 81.0000 mg | DELAYED_RELEASE_TABLET | Freq: Every day | ORAL | Status: DC

## 2011-08-28 NOTE — Progress Notes (Signed)
Utilization review completed. Chaeli Judy, RN, BSN. 08/28/11 

## 2011-08-28 NOTE — Progress Notes (Signed)
Pt was given discharge instructions, prescriptions, and follow up appointments.  Pt stated understanding.

## 2011-08-28 NOTE — Progress Notes (Signed)
Pt went into A-Fib and became symptomatic complaining of chest pain radiating to the back. 1 tablet of Nitro was given.  Dr. Allena Katz was called again and then he arrived. He gave a verbal order of Lopessor 5 mg IV. The MD was warned that the heart rate was dropping into the 50s. He decided to cancel the order of Lopressor. The patient states that discomfort has went away. Will continue to monitor.   Gamble Enderle Diona Foley, RN

## 2011-08-28 NOTE — Discharge Summary (Signed)
Physician Discharge Summary  Patient ID: Monique Fleming,  MRN: [id number], DOB/AGE: [birth year] 77 y.o.  Admit date: 08/27/2011 Discharge date: 08/28/2011  Primary Discharge Diagnosis:  1. Chest pain with probable underlying CAD, treated medically because of age/comorbidities.  A. Ruled out for MI.  B. Not on beta-blocker secondary to COPD.  Secondary Discharge Diagnosis:  1. Aortic stenosis, moderate by echo 6/11 gradient 20/58mmHg for mean and peak   2. TIA (transient ischemic attack)   3. Peripheral vascular disease with history of L CEA, diffuse atherosclerotic disease in thoracic aorta 4. IBS (irritable bowel syndrome)     5. Diabetes mellitus, type II   6.  COPD (chronic obstructive pulmonary disease)     7. Depression     8. Anxiety       9. Renal cyst     10. Cirrhosis     11. Seizure disorder     12. Anemia, iron deficiency   13. Cellulitis     14. Diverticular disease     15. GERD 16. Chronic insomnia 17. Adenocarcinoma of the colon 2008 s/p RHC  Hospital Course: [name] is a 77 y/o F with a history of moderate AS, PVD, but no prior history of known CAD who presented initially to the [facility] ED complaining of substernal CP into her neck and jaw associated with SOB. In ED, she did not have chest discomfort so did not receive SL NTG. She did receive ASA. She was transferred to [facility] and at time of cardiology evaluation, was pain free. She denied any exertional CP but does have chronic DOE. EKG showed NSR with NSST, no acute changes. Initial cardiac markers showed elevated CKMB at 4.4, but negative troponin and normal CK. She was admitted to the hospital for rule-out, and subsequent enzymes demonstrated a similar trend with negative troponins x 3. Given her diffuse vasculopathy it was felt by Dr. Daleen Squibb that no doubt she has CAD, and he elected to treat her medically given advanced age and comorbidities. Today, she still has some chest pain but it is different and worse  with palpation and thus felt musculoskeletal. Her SSCP has resolved and she ambulated without difficulty. Dr. Johney Frame has seen and examined her today and feels she is stable for discharge.  BB will be held in light of COPD. She will be discharged on aspirin, statin, CCB and prn NTG.   Discharge Vitals: Blood pressure 129/61, pulse 57, temperature 97.7 F (36.5 C), temperature source Oral, resp. rate 19, height 5' 5.5" (1.664 m), weight 141 lb 1.5 oz (64 kg), SpO2 96.00%.  Labs: Lab Results  Component Value Date   WBC 8.9 08/27/2011   HGB 12.7 08/27/2011   HCT 37.4 08/27/2011   MCV 86.2 08/27/2011   PLT 162 08/27/2011    Lab 08/27/11 1205 08/27/11 0434  NA -- 139  K -- 3.7  CL -- 105  CO2 -- 24  BUN -- 10  CREATININE 0.74 --  CALCIUM -- 8.9  PROT -- 7.5  BILITOT -- 0.3  ALKPHOS -- 89  ALT -- 23  AST -- 40*  GLUCOSE -- 99    Basename 08/27/11 1810 08/27/11 1322 08/27/11 1205 08/27/11 0434  CKTOTAL 108 119 111 111  CKMB 4.3* 4.7* 4.4* 4.4*  TROPONINI <0.30 <0.30 <0.30 <0.30   Lab Results  Component Value Date   CHOL 206* 03/20/2010   HDL 33.80* 03/20/2010   LDLCALC 92 11/08/2008   TRIG 427.0* 03/20/2010     Diagnostic  Studies/Procedures:  08/27/2011  CHEST - 2 VIEW    IMPRESSION: Emphysematous changes and mild left lower lobe opacity, likely scarring or atelectasis. No acute cardiopulmonary process identified.   Discharge Medications:  Current Discharge Medication List    START taking these medications   Details  amLODipine (NORVASC) 2.5 MG tablet Take 1 tablet (2.5 mg total) by mouth daily. Qty: 30 tablet, Refills: 6    aspirin EC 81 MG EC tablet Take 1 tablet (81 mg total) by mouth daily.    nitroGLYCERIN (NITROSTAT) 0.4 MG SL tablet Place 1 tablet (0.4 mg total) under the tongue every 5 (five) minutes as needed for chest pain (Up to 3 doses. If you have to take the 3rd dose, call 911.). Qty: 25 tablet, Refills: 4    pantoprazole (PROTONIX) 40 MG tablet Take 1  tablet (40 mg total) by mouth daily. Some studies suggest Nexium/Prilosec interact with Plavix. Take this instead for less chance of interaction. Qty: 30 tablet, Refills: 2    rosuvastatin (CRESTOR) 20 MG tablet Take 1 tablet (20 mg total) by mouth daily. Qty: 30 tablet, Refills: 6      CONTINUE these medications which have CHANGED   Details  ibuprofen (ADVIL,MOTRIN) 600 MG tablet Take 1 tablet (600 mg total) by mouth as needed. For pain. Only take sparingly as needed since this medicine can increase the risk of cardiovascular events as well as stomach bleeding while taking medicines like aspirin and Plavix.      CONTINUE these medications which have NOT CHANGED   Details  budesonide-formoterol (SYMBICORT) 80-4.5 MCG/ACT inhaler Inhale 2 puffs into the lungs 2 (two) times daily. Rinse mouth     Cholecalciferol (VITAMIN D3) 1000 UNITS tablet Take 1,000 Units by mouth daily.      clonazePAM (KLONOPIN) 0.5 MG tablet Take 2 tablets (1 mg total) by mouth at bedtime as needed. For sleep     clopidogrel (PLAVIX) 75 MG tablet Take 1 tablet (75 mg total) by mouth daily.     escitalopram (LEXAPRO) 20 MG tablet Take 1 tablet (20 mg total) by mouth at bedtime.     ferrous sulfate 325 (65 FE) MG tablet Take 1 tablet (325 mg total) by mouth daily with breakfast.     Fesoterodine Fumarate 8 MG TB24 Take 1 tablet (8 mg total) by mouth daily.     glimepiride (AMARYL) 1 MG tablet Take 1 tablet (1 mg total) by mouth daily before breakfast.     glucose blood test strip Use as instructed     ondansetron (ZOFRAN) 4 MG tablet Take 1 pill every 6 hours as needed for nausea     phenytoin (DILANTIN) 100 MG ER capsule Take 1 capsule (100 mg total) by mouth daily. Take 2 capsules by mouth at bedtime     potassium chloride (KLOR-CON) 10 MEQ CR tablet Take 2 tablets (20 mEq total) by mouth daily. Take 2 capsules by mouth daily     saccharomyces boulardii (FLORASTOR) 250 MG capsule Take 250 mg by  mouth 2 (two) times daily.      phenazopyridine (PYRIDIUM) 95 MG tablet Take 95 mg by mouth 3 (three) times daily as needed for urinary discomfort      STOP taking these medications     cholestyramine light (PREVALITE) 4 G packet      esomeprazole (NEXIUM) 40 MG capsule         Disposition:  The patient will be discharged in stable condition to home. Discharge Orders  Future Appointments: Provider: Department: Dept Phone: Center:   09/25/2011 2:15 PM Sonda Primes, MD [facility] [phone] [facility]   10/01/2011 10:00 AM Beatrice Lecher, PA [facility] [phone] [facility]   10/13/2011 3:45 PM Waymon Budge, MD [facility] [phone] None   01/15/2012 1:15 PM Gwendolyn A. Maisie Fus [place] [phone] None   01/15/2012 1:45 PM Colman Cater, FNP [facility] [phone] None     Future Orders Please Complete By Expires   Diet - low sodium heart healthy      Increase activity slowly        Follow-up Information    Follow up with Tereso Newcomer, PA. (10/01/11 at 10am at [facility])    Contact information:   [address] [place] [address] [phone]          Duration of Discharge Encounter: Greater than 30 minutes including physician and PA time.  Signed, Ronie Spies PA-C 08/28/2011, 11:36 AM  I have seen, examined the patient, and reviewed the above assessment and plan.   Co Sign: Hillis Range, MD 08/28/2011 5:05 PM

## 2011-08-28 NOTE — Progress Notes (Signed)
SUBJECTIVE: The patient is doing well today.  Her substernal chest pain is resolved.  She has mild pain under her L breast, worse with movement or palpation of the chest wall.  At this time, she denies shortness of breath, or any new concerns.     Marland Kitchen amLODipine  2.5 mg Oral Daily  . aspirin EC  81 mg Oral Daily  . aspirin  300 mg Rectal NOW  . clonazePAM  0.5 mg Oral QHS  . clopidogrel  75 mg Oral Q breakfast  . escitalopram  20 mg Oral Daily  . heparin  5,000 Units Subcutaneous Q8H  . metoprolol  5 mg Intravenous Once  . phenytoin  200 mg Oral QHS  . rosuvastatin  20 mg Oral Daily  . sodium chloride  3 mL Intravenous Q12H      OBJECTIVE: Physical Exam: Filed Vitals:   08/27/11 1314 08/27/11 1546 08/27/11 2230 08/28/11 0543  BP: 126/56 110/60 134/54 129/61  Pulse: 60  64 57  Temp: 97.9 F (36.6 C)  97.2 F (36.2 C) 97.7 F (36.5 C)  TempSrc: Oral  Oral Oral  Resp: 18  20 19   Height:      Weight:      SpO2: 94%  95% 96%   No intake or output data in the 24 hours ending 08/28/11 1610  Telemetry reveals sinus rhythm  GEN- The patient is well appearing, alert and oriented x 3 today.   Head- normocephalic, atraumatic Eyes-  Sclera clear, conjunctiva pink Ears- hearing intact Oropharynx- clear Neck- supple, no JVP Lymph- no cervical lymphadenopathy Lungs- expiratory wheezes, normal work of breathing Heart- Regular rate and rhythm 2/6 SEM LUSB GI- soft, NT, ND, + BS Extremities- no clubbing, cyanosis, or edema Skin- no rash or lesion Psych- euthymic mood, full affect Neuro- strength and sensation are intact  LABS: Basic Metabolic Panel:  Basename 08/27/11 1205 08/27/11 0434  NA -- 139  K -- 3.7  CL -- 105  CO2 -- 24  GLUCOSE -- 99  BUN -- 10  CREATININE 0.74 0.80  CALCIUM -- 8.9  MG -- --  PHOS -- --   Liver Function Tests:  [facility] 08/27/11 0434  AST 40*  ALT 23  ALKPHOS 89  BILITOT 0.3  PROT 7.5  ALBUMIN 3.4*   No results found for this  basename: LIPASE:2,AMYLASE:2 in the last 72 hours CBC:  Basename 08/27/11 1205 08/27/11 0434  WBC 8.9 8.9  NEUTROABS -- 5.4  HGB 12.7 12.5  HCT 37.4 36.8  MCV 86.2 85.6  PLT 162 150   Cardiac Enzymes:  Basename 08/27/11 1810 08/27/11 1322 08/27/11 1205  CKTOTAL 108 119 111  CKMB 4.3* 4.7* 4.4*  CKMBINDEX -- -- --  TROPONINI <0.30 <0.30 <0.30   Hemoglobin A1C:  Basename 08/27/11 1205  HGBA1C 5.9*  RADIOLOGY: Dg Chest 2 View  08/27/2011  *RADIOLOGY REPORT*  Clinical Data: Chest pain, shortness of breath  CHEST - 2 VIEW  Comparison: 04/16/2011  Findings: Mild bullous changes.  Mild left lung base opacity is likely scarring or atelectasis.  Otherwise, no focal areas of consolidation.  No pleural effusion or pneumothorax.  No acute osseous abnormality. Surgical clips right upper quadrant.  IMPRESSION: Emphysematous changes and mild left lower lobe opacity, likely scarring or atelectasis. No acute cardiopulmonary process identified.  Original Report Authenticated By: Waneta Martins, M.D.    ASSESSMENT AND PLAN:  1.  CAD- the patient has ruled out for MI.  She presently has only mild discomfort  under her left breast, worse when I palpate this area and suggestive of musculoskeletal pain.  Given her advanced age and comorbidities, I agree with Dr Daleen Squibb that medical management is reasonable. We will therefore continue medical therapy and ambulate today.  IF she has no further pain, she can be discharged this afternoon with outpatient follow-up with Tereso Newcomer. IF she has more pain while here, we will consider myoview.  2.  HTN- stable 3. DM- stable 4. PVD-stable 5. Moderate AS- stable   Hillis Range, MD 08/28/2011 8:32 AM

## 2011-08-31 LAB — GLUCOSE, CAPILLARY: Glucose-Capillary: 126 mg/dL — ABNORMAL HIGH (ref 70–99)

## 2011-09-02 ENCOUNTER — Ambulatory Visit: Payer: Medicare Other | Admitting: Internal Medicine

## 2011-09-02 ENCOUNTER — Telehealth: Payer: Self-pay | Admitting: Internal Medicine

## 2011-09-02 NOTE — Telephone Encounter (Signed)
I called the pt: she is doing well. She will keep her ROV w/me

## 2011-09-07 ENCOUNTER — Ambulatory Visit: Payer: Medicare Other | Admitting: Internal Medicine

## 2011-09-08 ENCOUNTER — Ambulatory Visit (INDEPENDENT_AMBULATORY_CARE_PROVIDER_SITE_OTHER): Payer: Medicare Other | Admitting: Internal Medicine

## 2011-09-08 ENCOUNTER — Encounter: Payer: Self-pay | Admitting: Internal Medicine

## 2011-09-08 ENCOUNTER — Other Ambulatory Visit: Payer: Medicare Other

## 2011-09-08 DIAGNOSIS — J40 Bronchitis, not specified as acute or chronic: Secondary | ICD-10-CM

## 2011-09-08 DIAGNOSIS — J019 Acute sinusitis, unspecified: Secondary | ICD-10-CM

## 2011-09-08 DIAGNOSIS — J449 Chronic obstructive pulmonary disease, unspecified: Secondary | ICD-10-CM

## 2011-09-08 DIAGNOSIS — J4489 Other specified chronic obstructive pulmonary disease: Secondary | ICD-10-CM

## 2011-09-08 DIAGNOSIS — N390 Urinary tract infection, site not specified: Secondary | ICD-10-CM

## 2011-09-08 MED ORDER — CLONAZEPAM 0.5 MG PO TABS: 1.0000 mg | ORAL_TABLET | Freq: Three times a day (TID) | ORAL | Status: DC | PRN

## 2011-09-08 MED ORDER — AZITHROMYCIN 250 MG PO TABS: ORAL_TABLET | ORAL | Status: AC

## 2011-09-08 MED ORDER — LEVALBUTEROL HCL 1.25 MG/0.5ML IN NEBU: 1.2500 mg | INHALATION_SOLUTION | Freq: Four times a day (QID) | RESPIRATORY_TRACT | Status: DC | PRN

## 2011-09-08 MED ORDER — GLIMEPIRIDE 1 MG PO TABS: 1.0000 mg | ORAL_TABLET | Freq: Two times a day (BID) | ORAL | Status: DC

## 2011-09-08 MED ORDER — METHYLPREDNISOLONE ACETATE PF 80 MG/ML IJ SUSP
120.0000 mg | Freq: Once | INTRAMUSCULAR | Status: AC
  Administered 2011-09-08: 120 mg via INTRAMUSCULAR

## 2011-09-08 NOTE — Assessment & Plan Note (Signed)
11/12 exacerbated Xopenex HHN

## 2011-09-08 NOTE — Assessment & Plan Note (Signed)
Monitor UA 

## 2011-09-08 NOTE — Assessment & Plan Note (Signed)
Start a  Z pac 

## 2011-09-08 NOTE — Progress Notes (Signed)
  Subjective:    Patient ID: Monique Fleming, female    DOB: May 03, 1929, 77 y.o.   MRN: [id number]  HPI   HPI  C/o URI sx's x   days. C/o ST, cough, weakness. Not better with OTC medicines. Actually, the patient is getting worse. The patient did not sleep last night due to cough.  Review of Systems  Constitutional: Positive for fever, chills and fatigue.  HENT: Positive for congestion, rhinorrhea, sneezing and postnasal drip.   Eyes: Positive for photophobia and pain. Negative for discharge and visual disturbance.  Respiratory: Positive for cough and wheezing.   Positive for chest pain.  Gastrointestinal: Negative for vomiting, abdominal pain, diarrhea and abdominal distention.  Genitourinary: Negative for dysuria and difficulty urinating.  Skin: Negative for rash.  Neurological: Positive for dizziness, weakness and light-headedness.    C/o UTIsC/o tremors w/albuterol  Review of Systems     Objective:   Physical Exam  Constitutional: She appears well-developed and well-nourished. No distress.  HENT:  Head: Normocephalic.  Right Ear: External ear normal.  Left Ear: External ear normal.  Nose: Nose normal.  Mouth/Throat: No oropharyngeal exudate.       eryth mucosa Nasal green d/c  Eyes: Conjunctivae are normal. Pupils are equal, round, and reactive to light. Right eye exhibits no discharge. Left eye exhibits no discharge.  Neck: Normal range of motion. Neck supple. No JVD present. No tracheal deviation present. No thyromegaly present.  Cardiovascular: Normal rate, regular rhythm and normal heart sounds.   Pulmonary/Chest: No stridor. No respiratory distress. She has no wheezes.  Abdominal: Soft. Bowel sounds are normal. She exhibits no distension and no mass. There is no tenderness. There is no rebound and no guarding.  Musculoskeletal: She exhibits no edema and no tenderness.  Lymphadenopathy:    She has no cervical adenopathy.  Neurological: She displays normal reflexes.  No cranial nerve deficit. She exhibits normal muscle tone. Coordination normal.  Skin: No rash noted. No erythema.  Psychiatric: She has a normal mood and affect. Her behavior is normal. Judgment and thought content normal.          Assessment & Plan:

## 2011-09-23 ENCOUNTER — Other Ambulatory Visit: Payer: Medicare Other

## 2011-09-25 ENCOUNTER — Ambulatory Visit: Payer: Medicare Other | Admitting: Internal Medicine

## 2011-09-25 ENCOUNTER — Encounter: Payer: Self-pay | Admitting: Internal Medicine

## 2011-09-26 LAB — CULTURE, URINE COMPREHENSIVE: Colony Count: >=100000

## 2011-09-30 ENCOUNTER — Telehealth: Payer: Self-pay | Admitting: Internal Medicine

## 2011-09-30 NOTE — Telephone Encounter (Signed)
Error

## 2011-09-30 NOTE — Telephone Encounter (Signed)
Pt says that her nebulizer machine is not working and can't tell me which DME company provides her supplies for it. She thinks it may be about 74 years old and says her PCP is the one that ordered this for but because [facility] is her lung doctor she called here. Pls adivse if pt needs to continue on nebulizer and if it is okay to send an order for a new machine.

## 2011-10-01 ENCOUNTER — Encounter: Payer: Medicare Other | Admitting: Physician Assistant

## 2011-10-01 NOTE — Telephone Encounter (Signed)
She is using Xopenex neb solution, presumably from a pharmacy. Ok to script for a new nebulizer machine that she can also get from a pharmacy, unless she wants to change to a DME company. If she wants to change, they will probably not provide Xopenex and she will change to albuterol, which is more apt to stimulate the heart. Dx chronic bronchitis.

## 2011-10-01 NOTE — Telephone Encounter (Signed)
Called and spoke with pt and she is aware of CY recs at this time.  She stated that she has appt with CY on 1-8 and they will discuss this at this ov.  She wanted to discuss this further with CY at her ov appt.

## 2011-10-13 ENCOUNTER — Encounter: Payer: Self-pay | Admitting: Internal Medicine

## 2011-10-13 ENCOUNTER — Ambulatory Visit (INDEPENDENT_AMBULATORY_CARE_PROVIDER_SITE_OTHER): Payer: Medicare Other | Admitting: Internal Medicine

## 2011-10-13 VITALS — BP 170/68 | HR 87 | Ht 65.5 in | Wt 142.8 lb

## 2011-10-13 DIAGNOSIS — J449 Chronic obstructive pulmonary disease, unspecified: Secondary | ICD-10-CM

## 2011-10-13 NOTE — Patient Instructions (Signed)
Please call as needed 

## 2011-10-15 ENCOUNTER — Encounter: Payer: Self-pay | Admitting: Internal Medicine

## 2011-10-15 NOTE — Progress Notes (Signed)
Patient ID: Monique Fleming, female    DOB: 03/12/1929, 78 y.o.   MRN: [id number]  HPI 69 yoF former smoker with COPD and multiple medical problems.  Last here November 05, 2010. Since then she reports staying off cigarettes and having no significant respiratory events. Pollen bothered more in March with nasal congestion, postnasal drip, but not much wheeze or cough. She feels current meds are adequate.  04/07/11- 81 yoF current smoker with COPD complicated by DM, ASCVD, hx colon cancer, anemia., GERD, esophageal varices. Was hosp for red, inflamed arm after Pneumovax with concern then that she had cellulits. Has had UTI then yeast from antibiotics- to see her GYN Has indigestion with known liver disease and esophageal varices.  Doesn't describe active respiratory complaints- mild occasional cough, dyspnea with exertion unchanged. We talked again about smoking and tried to motivate to full cessation. Smokes a few daily.  06/09/11- 20 yoF current smoker with COPD complicated by DM, ASCVD, hx colon cancer, anemia., GERD, esophageal varices. Reports doing well. Discussed flu shot after her sore arm from the pneumovax, and decided they were different enough that there should not be a similar reaction noted with flu vax.  She has not had significant recent cough, wheeze, phlegm, chest pain or palpitation.   08/11/11-  46 yoF current smoker with COPD complicated by DM, ASCVD, hx colon cancer, anemia., GERD, esophageal varices. She described her problems with bladder infection and C. difficile colitis requiring antibiotics almost continuously for the last 5 months. Her breathing has not changed with weather and season. She gets some raspy cough occasionally but rarely produces sputum. She denies chest pain or palpitation. She is still smoking an occasional cigarette I believe, but much less than before.  10/13/11- 67 yoF current smoker with COPD complicated by DM, ASCVD, hx colon cancer, anemia., GERD,  esophageal varices. Had a viral bronchitis syndrome, largely resolved now.  Review of Systems See HPI Constitutional:   No weight loss, night sweats,  Fevers, chills, fatigue, lassitude. HEENT:   No headaches,  Difficulty swallowing,  Tooth/dental problems,  Sore throat,                No sneezing, itching, ear ache, nasal congestion, post nasal drip,  CV:  No chest pain,  Orthopnea, PND, swelling in lower extremities, anasarca, dizziness, palpitations GI  No heartburn, indigestion, abdominal pain, nausea, vomiting, diarrhea, change in bowel habits, loss of appetite Resp: No acute shortness of breath with routine exertion or at rest.  No excess mucus,   No coughing up of blood.  No change in color of mucus.  .Skin: no rash or lesions. GU: MS:  No joint pain or swelling.  No decreased range of motion.  No back pain. Psych:  No change in mood or affect. No depression or anxiety.  No memory loss.     Objective:   Physical Exam General- Alert, Oriented, Affect-appropriate, Distress- none acute; bright and talkative today Skin- rash-none, lesions- none, excoriation- none Lymphadenopathy- none Head- atraumatic            Eyes- Gross vision intact, PERRLA, conjunctivae clear secretions            Ears- Hearing, canals-normal            Nose- Clear, no-Septal dev, mucus, polyps, erosion, perforation             Throat- Mallampati II , mucosa clear , drainage- none, tonsils- atrophic Neck- flexible , trachea midline, no stridor ,  thyroid nl, carotid no bruit Chest - symmetrical excursion , unlabored           Heart/CV- RRR , 1-2/6 systolic precordial murmur , no gallop  , no rub, nl s1 s2                           - JVD- none , edema- none, stasis changes- none, varices- none           Lung- clearer than last visit, wheeze- none, cough- none , dullness-none, rub- none           Chest wall-  Abd- Br/ Gen/ Rectal- Not done, not indicated Extrem- cyanosis- none, clubbing, none, atrophy- none,  strength- nl Neuro- grossly intact to observation

## 2011-10-15 NOTE — Assessment & Plan Note (Signed)
Recent exacerbation is resolved. Hopefully we can get her through the winter without further difficulty. We discussed medication options.

## 2011-10-20 ENCOUNTER — Ambulatory Visit (INDEPENDENT_AMBULATORY_CARE_PROVIDER_SITE_OTHER): Payer: Medicare Other | Admitting: Internal Medicine

## 2011-10-20 ENCOUNTER — Other Ambulatory Visit (INDEPENDENT_AMBULATORY_CARE_PROVIDER_SITE_OTHER): Payer: Medicare Other

## 2011-10-20 ENCOUNTER — Encounter: Payer: Self-pay | Admitting: Internal Medicine

## 2011-10-20 VITALS — BP 130/78 | HR 80 | Temp 98.0°F | Resp 16 | Wt 141.0 lb

## 2011-10-20 DIAGNOSIS — G4700 Insomnia, unspecified: Secondary | ICD-10-CM

## 2011-10-20 DIAGNOSIS — D518 Other vitamin B12 deficiency anemias: Secondary | ICD-10-CM

## 2011-10-20 DIAGNOSIS — R079 Chest pain, unspecified: Secondary | ICD-10-CM

## 2011-10-20 DIAGNOSIS — F411 Generalized anxiety disorder: Secondary | ICD-10-CM

## 2011-10-20 DIAGNOSIS — K7460 Unspecified cirrhosis of liver: Secondary | ICD-10-CM

## 2011-10-20 DIAGNOSIS — F3289 Other specified depressive episodes: Secondary | ICD-10-CM

## 2011-10-20 DIAGNOSIS — R5381 Other malaise: Secondary | ICD-10-CM

## 2011-10-20 DIAGNOSIS — E785 Hyperlipidemia, unspecified: Secondary | ICD-10-CM

## 2011-10-20 DIAGNOSIS — F329 Major depressive disorder, single episode, unspecified: Secondary | ICD-10-CM

## 2011-10-20 DIAGNOSIS — D509 Iron deficiency anemia, unspecified: Secondary | ICD-10-CM

## 2011-10-20 DIAGNOSIS — J449 Chronic obstructive pulmonary disease, unspecified: Secondary | ICD-10-CM

## 2011-10-20 DIAGNOSIS — R5383 Other fatigue: Secondary | ICD-10-CM

## 2011-10-20 DIAGNOSIS — E119 Type 2 diabetes mellitus without complications: Secondary | ICD-10-CM

## 2011-10-20 DIAGNOSIS — Z79899 Other long term (current) drug therapy: Secondary | ICD-10-CM

## 2011-10-20 DIAGNOSIS — J4489 Other specified chronic obstructive pulmonary disease: Secondary | ICD-10-CM

## 2011-10-20 LAB — CBC WITH DIFFERENTIAL/PLATELET
Basophils Relative: 0.2 % (ref 0.0–3.0)
Eosinophils Absolute: 0.2 10*3/uL (ref 0.0–0.7)
HCT: 36.5 % (ref 36.0–46.0)
Lymphs Abs: 2.3 10*3/uL (ref 0.7–4.0)
MCHC: 34.4 g/dL (ref 30.0–36.0)
MCV: 87.7 fl (ref 78.0–100.0)
Monocytes Absolute: 0.6 10*3/uL (ref 0.1–1.0)
Neutrophils Relative %: 62.7 % (ref 43.0–77.0)
Platelets: 157 10*3/uL (ref 150.0–400.0)
RBC: 4.16 Mil/uL (ref 3.87–5.11)

## 2011-10-20 LAB — HEPATIC FUNCTION PANEL
ALT: 27 U/L (ref 0–35)
AST: 54 U/L — ABNORMAL HIGH (ref 0–37)
Albumin: 3.5 g/dL (ref 3.5–5.2)
Total Bilirubin: 0.5 mg/dL (ref 0.3–1.2)

## 2011-10-20 MED ORDER — VITAMIN B-12 500 MCG SL SUBL: 1.0000 | SUBLINGUAL_TABLET | SUBLINGUAL | Status: DC

## 2011-10-20 NOTE — Assessment & Plan Note (Signed)
Continue with current prescription therapy as reflected on the Med list.  

## 2011-10-20 NOTE — Assessment & Plan Note (Signed)
Continue with current prescription therapy as reflected on the Med list: Iron

## 2011-10-20 NOTE — Assessment & Plan Note (Signed)
Chronic. 

## 2011-10-20 NOTE — Assessment & Plan Note (Signed)
Continue with current prescription therapy as reflected on the Med list. Check labs 

## 2011-10-20 NOTE — Assessment & Plan Note (Signed)
labs

## 2011-10-20 NOTE — Progress Notes (Signed)
  Subjective:    Patient ID: Monique Fleming, female    DOB: 04/20/1929, 78 y.o.   MRN: [id number]  HPI   The patient presents for a follow-up of  chronic hypertension, chronic dyslipidemia, type 2 diabetes controlled with medicines  F/u COPD, UTI C/o depression - worse   Review of Systems  Constitutional: Positive for fatigue. Negative for chills, activity change, appetite change and unexpected weight change.  HENT: Negative for congestion, mouth sores and sinus pressure.   Eyes: Negative for visual disturbance.  Respiratory: Positive for shortness of breath. Negative for cough and chest tightness.   Gastrointestinal: Negative for nausea and abdominal pain.  Genitourinary: Negative for frequency, difficulty urinating and vaginal pain.  Musculoskeletal: Negative for back pain and gait problem.  Skin: Negative for pallor and rash.  Neurological: Negative for dizziness, tremors, weakness, numbness and headaches.  Psychiatric/Behavioral: Negative for suicidal ideas, confusion and sleep disturbance. The patient is nervous/anxious.        Objective:   Physical Exam  Constitutional: She appears well-developed. No distress.  HENT:  Head: Normocephalic.  Right Ear: External ear normal.  Left Ear: External ear normal.  Nose: Nose normal.  Mouth/Throat: Oropharynx is clear and moist.  Eyes: Conjunctivae are normal. Pupils are equal, round, and reactive to light. Right eye exhibits no discharge. Left eye exhibits no discharge.  Neck: Normal range of motion. Neck supple. No JVD present. No tracheal deviation present. No thyromegaly present.  Cardiovascular: Normal rate, regular rhythm and normal heart sounds.   Pulmonary/Chest: No stridor. No respiratory distress. She has no wheezes.  Abdominal: Soft. Bowel sounds are normal. She exhibits no distension and no mass. There is no tenderness. There is no rebound and no guarding.  Musculoskeletal: She exhibits no edema and no tenderness.    Lymphadenopathy:    She has no cervical adenopathy.  Neurological: She displays normal reflexes. No cranial nerve deficit. She exhibits normal muscle tone. Coordination normal.  Skin: No rash noted. No erythema.  Psychiatric: She has a normal mood and affect. Her behavior is normal. Judgment and thought content normal.    Lab Results  Component Value Date   WBC 8.9 08/27/2011   HGB 12.7 08/27/2011   HCT 37.4 08/27/2011   PLT 162 08/27/2011   GLUCOSE 99 08/27/2011   CHOL 206* 03/20/2010   TRIG 427.0* 03/20/2010   HDL 33.80* 03/20/2010   LDLDIRECT 124.3 03/20/2010   LDLCALC 92 11/08/2008   ALT 23 08/27/2011   AST 40* 08/27/2011   NA 139 08/27/2011   K 3.7 08/27/2011   CL 105 08/27/2011   CREATININE 0.74 08/27/2011   BUN 10 08/27/2011   CO2 24 08/27/2011   TSH 2.09 08/19/2010   INR 1.18 08/27/2011   HGBA1C 5.9* 08/27/2011         Assessment & Plan:

## 2011-10-20 NOTE — Assessment & Plan Note (Signed)
resolved 

## 2011-10-20 NOTE — Assessment & Plan Note (Signed)
Continue with current prescription therapy as reflected on the Med list. Psychiatry consult Dr Nolen Mu

## 2011-10-20 NOTE — Assessment & Plan Note (Signed)
Monitoring labs 

## 2011-10-21 ENCOUNTER — Telehealth: Payer: Self-pay | Admitting: Internal Medicine

## 2011-10-21 NOTE — Telephone Encounter (Signed)
Stacey, please, inform patient that all labs are ok Thx 

## 2011-10-21 NOTE — Telephone Encounter (Signed)
Pt informed

## 2011-10-27 ENCOUNTER — Telehealth: Payer: Self-pay

## 2011-10-27 DIAGNOSIS — R197 Diarrhea, unspecified: Secondary | ICD-10-CM

## 2011-10-27 NOTE — Telephone Encounter (Signed)
Pt states she received a recall letter in the mail for an EGD. Pt states she thinks due to the odor and her stool she thinks her cdiff might be back. Pt wants to know if she should schedule the EGD or be tested again for the cdiff. Dr. [name] please advise.

## 2011-10-27 NOTE — Telephone Encounter (Signed)
Both!  Thanks~!

## 2011-10-28 NOTE — Telephone Encounter (Signed)
Pt scheduled for previsit 11/06/11@2 :30pm, EGD scheduled for 11/10/11@10am . Pt aware of appt dates and times. Pt to come to the lab for stool specimen for cdiff.

## 2011-11-03 ENCOUNTER — Other Ambulatory Visit: Payer: Medicare Other

## 2011-11-03 DIAGNOSIS — R197 Diarrhea, unspecified: Secondary | ICD-10-CM

## 2011-11-05 ENCOUNTER — Telehealth: Payer: Self-pay | Admitting: Internal Medicine

## 2011-11-06 LAB — CLOSTRIDIUM DIFFICILE BY PCR: Toxigenic C. Difficile by PCR: NOT DETECTED

## 2011-11-06 NOTE — Telephone Encounter (Signed)
Spoke with pt and let her know the results are not back yet. Pt requested her previsit be changed to a later time, moved appt to 3:30pm today.

## 2011-11-09 ENCOUNTER — Telehealth: Payer: Self-pay

## 2011-11-09 NOTE — Telephone Encounter (Signed)
Pt aware.

## 2011-11-09 NOTE — Telephone Encounter (Signed)
Message copied by Michele Mcalpine on Mon Nov 09, 2011  3:51 PM ------      Message from: Hilarie Fredrickson      Created: Mon Nov 09, 2011  3:21 PM       Let pt know C. Diff is negative

## 2011-11-10 ENCOUNTER — Encounter: Payer: Medicare Other | Admitting: Internal Medicine

## 2011-11-11 ENCOUNTER — Ambulatory Visit (AMBULATORY_SURGERY_CENTER): Payer: Medicare Other | Admitting: *Deleted

## 2011-11-11 ENCOUNTER — Telehealth: Payer: Self-pay

## 2011-11-11 VITALS — Ht 65.0 in | Wt 138.8 lb

## 2011-11-11 DIAGNOSIS — K7460 Unspecified cirrhosis of liver: Secondary | ICD-10-CM

## 2011-11-11 NOTE — Telephone Encounter (Signed)
Hi, Linda, 5-7 days would be ok Thank you, AP

## 2011-11-11 NOTE — Telephone Encounter (Signed)
November 11, 2011   LACHELL ROCHETTE [address] [address] [place] [address]    Dear Dr. Posey Rea,:  We have scheduled the above patient for an endoscopic procedure. Our records show that  he/she is on anticoagulation therapy. Please advise as to how long the patient may come off their therapy of Plavix prior to the scheduled procedure(s) on 11/19/11.     Thank you for your help with this matter.  Sincerely,  Selinda Michaels, RN   Physician Recommendation:  Hold Plavix 7 days prior ________________  Hold Coumadin 5 days prior ____________  Other ______________________________        Selinda Michaels, RN

## 2011-11-12 ENCOUNTER — Encounter: Payer: Medicare Other | Admitting: Internal Medicine

## 2011-11-12 NOTE — Telephone Encounter (Signed)
Pt is scheduled for EGD on 11/19/11. Pt notified to hold Plavix starting today per Dr. Posey Rea.

## 2011-11-19 ENCOUNTER — Encounter: Payer: Self-pay | Admitting: Internal Medicine

## 2011-11-19 ENCOUNTER — Ambulatory Visit (AMBULATORY_SURGERY_CENTER): Payer: Medicare Other | Admitting: Internal Medicine

## 2011-11-19 ENCOUNTER — Encounter: Payer: Medicare Other | Admitting: Internal Medicine

## 2011-11-19 VITALS — BP 118/53 | HR 65 | Temp 98.3°F | Resp 17 | Ht 65.0 in | Wt 138.0 lb

## 2011-11-19 DIAGNOSIS — K7460 Unspecified cirrhosis of liver: Secondary | ICD-10-CM

## 2011-11-19 LAB — GLUCOSE, CAPILLARY: Glucose-Capillary: 131 mg/dL — ABNORMAL HIGH (ref 70–99)

## 2011-11-19 MED ORDER — SODIUM CHLORIDE 0.9 % IV SOLN: 500.0000 mL | INTRAVENOUS | Status: DC

## 2011-11-19 NOTE — Progress Notes (Signed)
Patient did not experience any of the following events: a burn prior to discharge; a fall within the facility; wrong site/side/patient/procedure/implant event; or a hospital transfer or hospital admission upon discharge from the facility. (G8907) Patient did not have preoperative order for IV antibiotic SSI prophylaxis. (G8918)  

## 2011-11-19 NOTE — Patient Instructions (Signed)
Discharge instructions given with verbal understanding.  Normal exam.  Resume previous medications. 

## 2011-11-19 NOTE — Op Note (Signed)
[facility] [address] [place], [place]  [address]  ENDOSCOPY PROCEDURE REPORT  PATIENT:  Monique Fleming, Monique Fleming  MR#:  [id number] BIRTHDATE:  01/21/1929, 82 yrs. old  GENDER:  female  ENDOSCOPIST:  Wilhemina Bonito. Eda Keys, MD Referred by:  Screening / Recall  PROCEDURE DATE:  11/19/2011 PROCEDURE:  EGD, diagnostic 43235 ASA CLASS:  Class III INDICATIONS:  Evaluate for esophageal varices in a patient with portal hypertension and/or cirrhosis. ; negative exam one year ago   MEDICATIONS:   MAC sedation, administered by CRNA, propofol (Diprivan) 80 mg IV TOPICAL ANESTHETIC:  none  DESCRIPTION OF PROCEDURE:   After the risks benefits and alternatives of the procedure were thoroughly explained, informed consent was obtained.  The LB GIF-H180 [id number] endoscope was introduced through the mouth and advanced to the second portion of the duodenum, without limitations.  The instrument was slowly withdrawn as the mucosa was fully examined. <<PROCEDUREIMAGES>>  Normal esophagus. No varices. Mild portal gastropathy.  Otherwise normal stomach.  The duodenal bulb and second duodenum were without abnormalities.    Retroflexed views revealed no abnormalities.    The scope was then withdrawn from the patient and the procedure completed.  COMPLICATIONS:  None  ENDOSCOPIC IMPRESSION: 1) Normal esophagus without varices 2) Portal gastropathy 3) Otherwise normal stomach 4) Normal duodenum  RECOMMENDATIONS: 1) Relook endoscopy in 2 years  ______________________________ Wilhemina Bonito. Eda Keys, MD  CC:  Linda Hedges. Plotnikov, MD; The Patient  n. eSIGNED:   Wilhemina Bonito. Eda Keys at 11/19/2011 02:20 PM  Fanny Dance, [id number]

## 2011-11-20 ENCOUNTER — Telehealth: Payer: Self-pay | Admitting: *Deleted

## 2011-11-20 NOTE — Telephone Encounter (Signed)
  Follow up Call-  Call back number 11/19/2011  Post procedure Call Back phone  # [phone]  Permission to leave phone message No     Patient questions:  Do you have a fever, pain , or abdominal swelling? no Pain Score  0 *  Have you tolerated food without any problems? yes  Have you been able to return to your normal activities? yes  Do you have any questions about your discharge instructions: Diet   no Medications  no Follow up visit  no  Do you have questions or concerns about your Care? no  Actions: * If pain score is 4 or above: No action needed, pain <4.

## 2011-12-11 ENCOUNTER — Ambulatory Visit (INDEPENDENT_AMBULATORY_CARE_PROVIDER_SITE_OTHER): Payer: Medicare Other | Admitting: Internal Medicine

## 2011-12-11 ENCOUNTER — Encounter: Payer: Self-pay | Admitting: Internal Medicine

## 2011-12-11 VITALS — BP 120/84 | HR 86 | Ht 65.5 in | Wt 148.0 lb

## 2011-12-11 DIAGNOSIS — J449 Chronic obstructive pulmonary disease, unspecified: Secondary | ICD-10-CM

## 2011-12-11 MED ORDER — BUDESONIDE-FORMOTEROL FUMARATE 80-4.5 MCG/ACT IN AERO: 2.0000 | INHALATION_SPRAY | Freq: Two times a day (BID) | RESPIRATORY_TRACT | Status: DC

## 2011-12-11 NOTE — Patient Instructions (Signed)
Refill script for Symbicort- remember to rinse your mouth after you use it  Ok to try diluting the nebulizer solution with a little water

## 2011-12-11 NOTE — Progress Notes (Signed)
Patient ID: Monique Fleming, female    DOB: April 19, 1929, 78 y.o.   MRN: [id number]  HPI 89 yoF former smoker with COPD and multiple medical problems.  Last here November 05, 2010. Since then she reports staying off cigarettes and having no significant respiratory events. Pollen bothered more in March with nasal congestion, postnasal drip, but not much wheeze or cough. She feels current meds are adequate.  04/07/11- 81 yoF current smoker with COPD complicated by DM, ASCVD, hx colon cancer, anemia., GERD, esophageal varices. Was hosp for red, inflamed arm after Pneumovax with concern then that she had cellulits. Has had UTI then yeast from antibiotics- to see her GYN Has indigestion with known liver disease and esophageal varices.  Doesn't describe active respiratory complaints- mild occasional cough, dyspnea with exertion unchanged. We talked again about smoking and tried to motivate to full cessation. Smokes a few daily.  06/09/11- 9 yoF current smoker with COPD complicated by DM, ASCVD, hx colon cancer, anemia., GERD, esophageal varices. Reports doing well. Discussed flu shot after her sore arm from the pneumovax, and decided they were different enough that there should not be a similar reaction noted with flu vax.  She has not had significant recent cough, wheeze, phlegm, chest pain or palpitation.   08/11/11-  33 yoF current smoker with COPD complicated by DM, ASCVD, hx colon cancer, anemia., GERD, esophageal varices. She described her problems with bladder infection and C. difficile colitis requiring antibiotics almost continuously for the last 5 months. Her breathing has not changed with weather and season. She gets some raspy cough occasionally but rarely produces sputum. She denies chest pain or palpitation. She is still smoking an occasional cigarette I believe, but much less than before.  10/13/11- 76 yoF current smoker with COPD complicated by DM, ASCVD, hx colon cancer, anemia., GERD,  esophageal varices. Had a viral bronchitis syndrome, largely resolved now.  12/11/11- 82 yoF current smoker with COPD complicated by DM, ASCVD, hx colon cancer, anemia., GERD, esophageal varices Pharmacist suggested she blunt stimulation from  xopenex neb solution by diluting with a little water. She isn't sure the neb makes that much difference. Asks refill Symbicort. Reviewed her complex medical problem list. Not having acute respiratory symptoms now.  Review of Systems-See HPI Constitutional:   No weight loss, night sweats,  Fevers, chills,  +fatigue, lassitude. HEENT:   No headaches,  Difficulty swallowing,  Tooth/dental problems,  Sore throat,                No sneezing, itching, ear ache, nasal congestion, post nasal drip,  CV:  No chest pain,  Orthopnea, PND, swelling in lower extremities, anasarca, dizziness, palpitations GI  No heartburn, indigestion, abdominal pain, nausea, vomiting, e Resp: No acute shortness of breath with routine exertion or at rest.  No excess mucus,   No coughing up of blood.  No change in color of mucus.  .Skin: no rash or lesions. GU: MS:  No joint pain or swelling.  n. Psych:  No change in mood or affect. + depression or anxiety.  No memory loss.     Objective:   Physical Exam General- Alert, Oriented, Affect-appropriate, Distress- none acute; calm and talkative today Skin- rash-none, lesions- none, excoriation- none. Dry.  Lymphadenopathy- none Head- atraumatic            Eyes- Gross vision intact, PERRLA, conjunctivae clear secretions            Ears- Hearing, canals-normal  Nose- Clear, no-Septal dev, mucus, polyps, erosion, perforation             Throat- Mallampati II , mucosa clear , drainage- none, tonsils- atrophic Neck- flexible , trachea midline, no stridor , thyroid nl, carotid no bruit Chest - symmetrical excursion , unlabored           Heart/CV- RRR , 1-2/6 systolic precordial murmur , no gallop  , no rub, nl s1 s2                            - JVD- none , edema- none, stasis changes- none, varices- none           Lung- , wheeze- none, cough- none , dullness-none, rub- none, unlabored           Chest wall-  Abd- Br/ Gen/ Rectal- Not done, not indicated Extrem- cyanosis- none, clubbing, none, atrophy- none, strength- nl Neuro- grossly intact to observation

## 2011-12-15 ENCOUNTER — Ambulatory Visit: Payer: Medicare Other | Admitting: Internal Medicine

## 2011-12-15 ENCOUNTER — Ambulatory Visit: Payer: Medicare Other | Admitting: Pulmonary Disease

## 2011-12-15 NOTE — Assessment & Plan Note (Signed)
Currently near baseline. She can work with current xopenex neb supply by using only 1/2 ampule per treatment, and even diluting that as tolerated. If nebs don't do much for her, at least during exacerbations, then she won't bother with them. She hasn't had a recent serious exacerbation.

## 2011-12-28 ENCOUNTER — Ambulatory Visit (INDEPENDENT_AMBULATORY_CARE_PROVIDER_SITE_OTHER): Payer: Medicare Other | Admitting: Endocrinology

## 2011-12-28 ENCOUNTER — Encounter: Payer: Self-pay | Admitting: Endocrinology

## 2011-12-28 VITALS — BP 140/76 | HR 79 | Temp 98.2°F | Wt 145.4 lb

## 2011-12-28 DIAGNOSIS — M79609 Pain in unspecified limb: Secondary | ICD-10-CM

## 2011-12-28 DIAGNOSIS — E119 Type 2 diabetes mellitus without complications: Secondary | ICD-10-CM

## 2011-12-28 NOTE — Patient Instructions (Addendum)
good diet and exercise habits significanly improve the control of your diabetes.  please let me know if you wish to be referred to a dietician.  high blood sugar is very risky to your health.  you should see an eye doctor every year. controlling your blood pressure and cholesterol drastically reduces the damage diabetes does to your body.  this also applies to quitting smoking.  please discuss these with your doctor.  you should take an aspirin every day, unless you have been advised by a doctor not to. check your blood sugar 1 time a day.  vary the time of day when you check, between before the 3 meals, and at bedtime.  also check if you have symptoms of your blood sugar being too high or too low.  please keep a record of the readings and bring it to your next appointment here.  please call us sooner if your blood sugar goes below 70, or if it stays over 200. It is extremely important to take glimepiride only 1 mg daily.

## 2011-12-28 NOTE — Progress Notes (Signed)
Subjective:    Patient ID: Monique Fleming, female    DOB: 1929-04-22, 78 y.o.   MRN: [id number]  HPI pt states 25 years h/o dm.  she is unaware of any chronic complications.  she has never been on insulin.  pt says his diet and exercise are both poor. Pt says her cbg's are variable.  She has frequent mild hypoglycemia.  She says she often takes a 2nd dose of amaryl in the evening if cbg is over 150.   Pt states few years of slight pain at the toes of both feet, but no assoc numbness. Past Medical History  Diagnosis Date  . Aortic stenosis     moderate by echo 6/11 gradient 20/69mmHg for mean and peak  . TIA (transient ischemic attack)     Dr. Pearlean Brownie  . Peripheral vascular disease   . IBS (irritable bowel syndrome)   . Diabetes mellitus     type II  . Chronic bronchitis   . COPD (chronic obstructive pulmonary disease)   . Atherosclerosis   . Depression   . Anxiety     Dr Donell Beers  . Renal cyst   . Cirrhosis   . Seizure disorder   . Anemia     iron deficiency  . Cellulitis   . Diverticular disease   . Infectious diarrhea   . Sinusitis   . Thrush   . Abnormal liver function test   . GERD (gastroesophageal reflux disease)   . Insomnia     chronic  . Adenocarcinoma 2008    colon,s/p right hemicolectomy  . Angina   . Seizures     hx of one seizure "  . Cirrhosis of liver     hx of    Past Surgical History  Procedure Date  . Carotid endarterectomy   . Colectomy     right hemi  . Cholecystectomy 11/09    Rt Breast  . Upper gastrointestinal endoscopy     History   Social History  . Marital Status: Widowed    Spouse Name: N/A    Number of Children: N/A  . Years of Education: N/A   Occupational History  . retired    Social History Main Topics  . Smoking status: Former Smoker -- 0.5 packs/day    Quit date: 04/14/2011  . Smokeless tobacco: Never Used   Comment: Using Electronic cigarette  . Alcohol Use: No  . Drug Use: No  . Sexually Active: No   Other  Topics Concern  . Not on file   Social History Narrative   Patient signed a Designated Party Release to allow her daughter, Kikuye Korenek, to have access to her medical records/information. Entered by Daphane Shepherd March 24,2011 @ 2:47 pmDailly Caffeine Use    Current Outpatient Prescriptions on File Prior to Visit  Medication Sig Dispense Refill  . amLODipine (NORVASC) 2.5 MG tablet Take 1 tablet (2.5 mg total) by mouth daily.  30 tablet  6  . aspirin EC 81 MG EC tablet Take 1 tablet (81 mg total) by mouth daily.      . budesonide-formoterol (SYMBICORT) 80-4.5 MCG/ACT inhaler Inhale 2 puffs into the lungs 2 (two) times daily. Rinse mouth  3 Inhaler  3  . Cholecalciferol (VITAMIN D3) 1000 UNITS tablet Take 1,000 Units by mouth daily.        . clonazePAM (KLONOPIN) 0.5 MG tablet Take 2 tablets (1 mg total) by mouth 3 (three) times daily as needed. For sleep  270 tablet  1  . clopidogrel (PLAVIX) 75 MG tablet Take 1 tablet (75 mg total) by mouth daily.  90 tablet  3  . Cyanocobalamin (VITAMIN B-12) 500 MCG SUBL Place 1 tablet (500 mcg total) under the tongue 1 day or 1 dose.  100 tablet  3  . escitalopram (LEXAPRO) 20 MG tablet Take 1 tablet (20 mg total) by mouth at bedtime.  90 tablet  3  . esomeprazole (NEXIUM) 40 MG capsule Take 40 mg by mouth daily before breakfast.        . ferrous sulfate 325 (65 FE) MG tablet Take 1 tablet (325 mg total) by mouth daily with breakfast.  90 tablet  3  . Fesoterodine Fumarate 8 MG TB24 Take 1 tablet (8 mg total) by mouth daily.  30 tablet  5  . glimepiride (AMARYL) 1 MG tablet Take 1 tablet (1 mg total) by mouth 2 (two) times daily.  180 tablet  3  . glucose blood test strip Use as instructed  100 each  3  . ibuprofen (ADVIL,MOTRIN) 600 MG tablet Take 1 tablet (600 mg total) by mouth as needed. For pain. Only take sparingly as needed since this medicine can increase the risk of cardiovascular events as well as stomach bleeding while taking medicines like  aspirin and Plavix.      Marland Kitchen levalbuterol (XOPENEX) 1.25 MG/0.5ML nebulizer solution Take 1.25 mg by nebulization every 6 (six) hours as needed for wheezing.  120 each  3  . nitroGLYCERIN (NITROSTAT) 0.4 MG SL tablet Place 1 tablet (0.4 mg total) under the tongue every 5 (five) minutes as needed for chest pain (Up to 3 doses. If you have to take the 3rd dose, call 911.).  25 tablet  4  . pantoprazole (PROTONIX) 40 MG tablet Take 1 tablet (40 mg total) by mouth daily. Some studies suggest Nexium/Prilosec interact with Plavix. Take this instead for less chance of interaction.  30 tablet  2  . phenytoin (DILANTIN) 100 MG ER capsule Take 1 capsule (100 mg total) by mouth daily. Take 2 capsules by mouth at bedtime  180 capsule  3  . potassium chloride (KLOR-CON) 10 MEQ CR tablet Take 2 tablets (20 mEq total) by mouth daily. Take 2 capsules by mouth daily  180 tablet  3  . rosuvastatin (CRESTOR) 20 MG tablet Take 1 tablet (20 mg total) by mouth daily.  30 tablet  6  . saccharomyces boulardii (FLORASTOR) 250 MG capsule Take 250 mg by mouth 2 (two) times daily.          Allergies  Allergen Reactions  . Levofloxacin Other (See Comments)    seizure  . Pneumovax (Pneumococcal Polysaccharides) Other (See Comments)    Severe allergy-cellulitis  . Augmentin Other (See Comments)    Patient stated Drs. recommend taking Augmentin due to her PCN allergy.  . Penicillins Rash    unknown  . Morphine And Related     Confused and agitation  . Sudafed (Pseudoephedrine Hcl) Other (See Comments)    seizure    Family History  Problem Relation Age of Onset  . Hypertension    . Colon cancer Paternal Grandmother   . Esophageal cancer Brother   . Heart disease Mother   . Diabetes Brother   . Hypertension Father     BP 140/76  Pulse 79  Temp(Src) 98.2 F (36.8 C) (Oral)  Wt 145 lb 6.4 oz (65.953 kg)  SpO2 94%    Review of Systems denies weight loss, blurry vision,  headache, chest pain, n/v, cramps,  excessive diaphoresis, memory loss, depression, and menopausal sxs.  She has tremor of the hands, urinary frequency, rhinorrhea, easy bruising,and doe    Objective:   Physical Exam VS: see vs page GEN: no distress HEAD: head: no deformity eyes: no periorbital swelling, no proptosis external nose and ears are normal mouth: no lesion seen NECK: supple, thyroid is not enlarged CHEST WALL: no deformity LUNGS:  Clear to auscultation, except for a few exp wheezes CV: irreg rhythm, 3/6 systolic murmur.   ABD: abdomen is soft, nontender.  no hepatosplenomegaly.  not distended.  no hernia.   MUSCULOSKELETAL: muscle bulk and strength are grossly normal.  no obvious joint swelling.  gait is normal and steady EXTEMITIES: no deformity of the feet, except for clubbing of the toes.  no ulcer on the feet.  feet are of normal color and temp.  no edema PULSES: dorsalis pedis intact bilat.  There are bilat carotid bruits, vs transmitted murmurs NEURO:  cn 2-12 grossly intact.   readily moves all 4's.  sensation is intact to touch on the feet SKIN:  Normal texture and temperature.  No rash or suspicious lesion is visible.   NODES:  None palpable at the neck PSYCH: alert, oriented x3.  Does not appear anxious nor depressed.   Lab Results  Component Value Date   HGBA1C 5.5 10/20/2011      Assessment & Plan:  Type 2 DM is overcontrolled, given this sulfonylurea-containing regimen. noncomplicance with amaryl dosing.  i agree with dr plotnikov that this causes a high risk to pt's health.   Seizure hx.  This further increases the risk to her health from hypoglycemia.   Foot pain, uncertain etiology.  Possibly due to pad

## 2011-12-29 ENCOUNTER — Ambulatory Visit: Payer: Medicare Other | Admitting: Internal Medicine

## 2012-01-15 ENCOUNTER — Ambulatory Visit: Payer: Medicare Other | Admitting: Family

## 2012-01-15 ENCOUNTER — Other Ambulatory Visit: Payer: Medicare Other | Admitting: Lab

## 2012-01-15 ENCOUNTER — Ambulatory Visit (HOSPITAL_BASED_OUTPATIENT_CLINIC_OR_DEPARTMENT_OTHER): Payer: Medicare Other | Admitting: Hematology & Oncology

## 2012-01-15 ENCOUNTER — Other Ambulatory Visit (HOSPITAL_BASED_OUTPATIENT_CLINIC_OR_DEPARTMENT_OTHER): Payer: Medicare Other | Admitting: Lab

## 2012-01-15 VITALS — BP 139/59 | HR 77 | Temp 96.7°F | Ht 65.0 in | Wt 144.0 lb

## 2012-01-15 DIAGNOSIS — C189 Malignant neoplasm of colon, unspecified: Secondary | ICD-10-CM

## 2012-01-15 DIAGNOSIS — Z85038 Personal history of other malignant neoplasm of large intestine: Secondary | ICD-10-CM

## 2012-01-15 LAB — CBC WITH DIFFERENTIAL (CANCER CENTER ONLY)
BASO#: 0 10*3/uL (ref 0.0–0.2)
EOS%: 2.8 % (ref 0.0–7.0)
Eosinophils Absolute: 0.2 10*3/uL (ref 0.0–0.5)
HCT: 40 % (ref 34.8–46.6)
HGB: 13.6 g/dL (ref 11.6–15.9)
MCH: 29.3 pg (ref 26.0–34.0)
MCHC: 34 g/dL (ref 32.0–36.0)
MONO%: 8.2 % (ref 0.0–13.0)
NEUT%: 62.3 % (ref 39.6–80.0)
RBC: 4.64 10*6/uL (ref 3.70–5.32)

## 2012-01-15 NOTE — Progress Notes (Signed)
This office note has been dictated.

## 2012-01-15 NOTE — Progress Notes (Signed)
CC:   Monique Quint. Plotnikov, MD Wilhemina Bonito. [name], MD Archer Asa, M.D.  DIAGNOSIS:  Stage I (T1 N2 M0 N0) adenocarcinoma of the right colon.  CURRENT THERAPY:  Observation.  INTERIM HISTORY:  Monique Fleming comes in for followup.  This appears to be the last visit.  She has really done quite well.  She has had problems that really were not related to her cancer at all.  She has had no problems with fevers, sweats or chills.  She has had no nausea or vomiting.  She has had some other health issues as stated previously.  She is on quite a few medications for her other issues.  She is going to get a new Administrator, [employer].  She is very excited about that.  PHYSICAL EXAMINATION:  This is a well-developed, well-nourished white female in no obvious distress.  Vital signs:  Temperature of 96.7, pulse 77, respiratory rate 18, blood pressure 139/59.  Weight is 144.  Head and neck exam shows a normocephalic, atraumatic skull.  There are no ocular or oral lesions.  There are no palpable cervical or supraclavicular lymph nodes.  Lungs:  Clear to percussion and auscultation bilaterally.  Cardiac:  Regular rate and rhythm with a normal S1 and S2.  There are no murmurs, rubs or bruits.  Abdomen:  Soft with good bowel sounds.  There is no palpable abdominal mass.  There is no palpable hepatosplenomegaly.  Extremities show no clubbing, cyanosis or edema.  She does have what appears be a squamous cell or basal cell carcinoma on the left upper arm.  She thinks this is where she had a pneumonia shot and has had a "scab."  LABORATORY STUDIES:  White cell count is 7.8, hemoglobin 13.6, hematocrit 40, platelet count is 135.  IMPRESSION:  Monique Fleming is an 78 year old white female with a history of stage I adenocarcinoma of the colon.  This was the right colon.  She underwent resection back in 2008.  This was in May of 2008.  She has gotten through her 5 years of followup.  Again, I do not see that she is going to  have problems with recurrent disease.  Again, I recommended to her that she see a dermatologist for this lesion in the left upper arm.  I told Monique Fleming that of she needed [place] in the future, that she can always call us.  It was a pleasure being able to take care of Monique Fleming.   ______________________________ Josph Macho, M.D. PRE/MEDQ  D:  01/15/2012  T:  01/15/2012  Job:  [id number]

## 2012-01-18 ENCOUNTER — Telehealth: Payer: Self-pay | Admitting: Internal Medicine

## 2012-01-18 LAB — COMPREHENSIVE METABOLIC PANEL
AST: 43 U/L — ABNORMAL HIGH (ref 0–37)
Alkaline Phosphatase: 94 U/L (ref 39–117)
BUN: 7 mg/dL (ref 6–23)
Calcium: 9 mg/dL (ref 8.4–10.5)
Chloride: 107 mEq/L (ref 96–112)
Creatinine, Ser: 0.81 mg/dL (ref 0.50–1.10)
Glucose, Bld: 201 mg/dL — ABNORMAL HIGH (ref 70–99)

## 2012-01-18 NOTE — Telephone Encounter (Signed)
Patient would like to change physicians to [facility], due to location. Is this okay?  Thanks!

## 2012-01-18 NOTE — Telephone Encounter (Signed)
Ok w/me Thx 

## 2012-01-19 ENCOUNTER — Ambulatory Visit: Payer: Medicare Other | Admitting: Internal Medicine

## 2012-01-19 DIAGNOSIS — Z0289 Encounter for other administrative examinations: Secondary | ICD-10-CM

## 2012-01-19 NOTE — Telephone Encounter (Signed)
Patient made an appointment with Dr. Rodena Medin on 02/22/12

## 2012-01-27 ENCOUNTER — Telehealth: Payer: Self-pay | Admitting: *Deleted

## 2012-01-27 NOTE — Telephone Encounter (Signed)
Message copied by Anselm Jungling on Wed Jan 27, 2012  3:15 PM ------      Message from: Arlan Organ R      Created: Thu Jan 21, 2012 10:58 AM       Call - labs are great!!!  Keep up the great work!!!  Cindee Lame

## 2012-01-27 NOTE — Telephone Encounter (Signed)
Called patient to let her know that her labwork was ok per dr. ennever 

## 2012-01-29 ENCOUNTER — Telehealth: Payer: Self-pay | Admitting: *Deleted

## 2012-01-29 NOTE — Telephone Encounter (Addendum)
Message copied by Mirian Capuchin on Fri Jan 29, 2012 10:36 AM ------      Message from: Arlan Organ R      Created: Thu Jan 21, 2012 10:58 AM       Call - labs are great!!!  Keep up the great work!!!  Pete  Called Patient and left message on home phone @ 11:30 ST.

## 2012-01-29 NOTE — Telephone Encounter (Signed)
Message copied by Mirian Capuchin on Fri Jan 29, 2012 11:21 AM ------      Message from: Arlan Organ R      Created: Thu Jan 21, 2012 10:58 AM       Call - labs are great!!!  Keep up the great work!!!  Cindee Lame

## 2012-02-09 ENCOUNTER — Ambulatory Visit (INDEPENDENT_AMBULATORY_CARE_PROVIDER_SITE_OTHER): Payer: Medicare Other | Admitting: Internal Medicine

## 2012-02-09 ENCOUNTER — Encounter: Payer: Self-pay | Admitting: Internal Medicine

## 2012-02-09 VITALS — BP 130/80 | HR 72 | Ht 65.5 in | Wt 144.4 lb

## 2012-02-09 DIAGNOSIS — F17200 Nicotine dependence, unspecified, uncomplicated: Secondary | ICD-10-CM

## 2012-02-09 DIAGNOSIS — J449 Chronic obstructive pulmonary disease, unspecified: Secondary | ICD-10-CM

## 2012-02-09 NOTE — Patient Instructions (Signed)
Please call as needed 

## 2012-02-09 NOTE — Progress Notes (Signed)
Patient ID: ETERNITY DEXTER, female    DOB: 10/28/1928, 78 y.o.   MRN: [id number]  HPI 26 yoF former smoker with COPD and multiple medical problems.  Last here November 05, 2010. Since then she reports staying off cigarettes and having no significant respiratory events. Pollen bothered more in March with nasal congestion, postnasal drip, but not much wheeze or cough. She feels current meds are adequate.  04/07/11- 81 yoF current smoker with COPD complicated by DM, ASCVD, hx colon cancer, anemia., GERD, esophageal varices. Was hosp for red, inflamed arm after Pneumovax with concern then that she had cellulits. Has had UTI then yeast from antibiotics- to see her GYN Has indigestion with known liver disease and esophageal varices.  Doesn't describe active respiratory complaints- mild occasional cough, dyspnea with exertion unchanged. We talked again about smoking and tried to motivate to full cessation. Smokes a few daily.  06/09/11- 73 yoF current smoker with COPD complicated by DM, ASCVD, hx colon cancer, anemia., GERD, esophageal varices. Reports doing well. Discussed flu shot after her sore arm from the pneumovax, and decided they were different enough that there should not be a similar reaction noted with flu vax.  She has not had significant recent cough, wheeze, phlegm, chest pain or palpitation.   08/11/11-  44 yoF current smoker with COPD complicated by DM, ASCVD, hx colon cancer, anemia., GERD, esophageal varices. She described her problems with bladder infection and C. difficile colitis requiring antibiotics almost continuously for the last 5 months. Her breathing has not changed with weather and season. She gets some raspy cough occasionally but rarely produces sputum. She denies chest pain or palpitation. She is still smoking an occasional cigarette I believe, but much less than before.  10/13/11- 25 yoF current smoker with COPD complicated by DM, ASCVD, hx colon cancer, anemia., GERD,  esophageal varices. Had a viral bronchitis syndrome, largely resolved now.  12/11/11- 82 yoF current smoker with COPD complicated by DM, ASCVD, hx colon cancer, anemia., GERD, esophageal varices Pharmacist suggested she blunt stimulation from  xopenex neb solution by diluting with a little water. She isn't sure the neb makes that much difference. Asks refill Symbicort. Reviewed her complex medical problem list. Not having acute respiratory symptoms now.  02/09/12- 67 yoF former smoker with COPD complicated by DM, ASCVD, hx colon cancer, anemia., GERD, esophageal varices  Pt states breathing is the same, no better or worse since last visit  She assures me that she is not smoking. Breathing feels comfortable with little routine cough or wheeze. Uses Symbicort twice daily. She has not been using her nebulizer machine. CXR 08/27/11 reviewed w/ her IMPRESSION:  Emphysematous changes and mild left lower lobe opacity, likely  scarring or atelectasis. No acute cardiopulmonary process  identified.  Original Report Authenticated By: Waneta Martins, M.D.   Review of Systems-See HPI Constitutional:   No weight loss, night sweats,  Fevers, chills,  fatigue, lassitude. HEENT:   No headaches,  Difficulty swallowing,  Tooth/dental problems,  Sore throat,                No sneezing, itching, ear ache, nasal congestion, post nasal drip,  CV:  No chest pain, orthopnea, PND, swelling in lower extremities, anasarca, dizziness, palpitations GI  No heartburn, indigestion, abdominal pain, nausea, vomiting,  Resp: No acute shortness of breath with routine exertion or at rest.  No excess mucus,   No coughing up of blood.  No change in color of mucus.  Skin:  no rash or lesions. GU: MS:  No joint pain or swelling.  Marland Kitchen Psych:  No change in mood or affect. + depression or anxiety.  No memory loss.     Objective:   Physical Exam General- Alert, Oriented, Affect-appropriate, Distress- none acute; calm and talkative  today Skin- rash-none, lesions- none, excoriation- none. Dry.  Lymphadenopathy- none Head- atraumatic            Eyes- Gross vision intact, PERRLA, conjunctivae clear secretions            Ears- Hearing, canals-normal            Nose- Clear, no-Septal dev, mucus, polyps, erosion, perforation             Throat- Mallampati II , mucosa clear , drainage- none, tonsils- atrophic Neck- flexible , trachea midline, no stridor , thyroid nl, carotid no bruit Chest - symmetrical excursion , unlabored           Heart/CV- RRR , 1-2/6 systolic precordial murmur , no gallop  , no rub, nl s1 s2                           - JVD- none , edema- none, stasis changes- none, varices- none           Lung- , wheeze- none, cough- none , dullness-none, rub- none. Breathless speech pattern seems chronic.           Chest wall-  Abd- Br/ Gen/ Rectal- Not done, not indicated Extrem- cyanosis- none, clubbing, none, atrophy- none, strength- nl Neuro- grossly intact to observation

## 2012-02-12 ENCOUNTER — Encounter: Payer: Self-pay | Admitting: Internal Medicine

## 2012-02-12 NOTE — Assessment & Plan Note (Signed)
Encouraged to stay off cigarettes

## 2012-02-12 NOTE — Assessment & Plan Note (Signed)
She is as clear today as I have heard her and seems well-controlled without recent exacerbation.

## 2012-02-22 ENCOUNTER — Ambulatory Visit (INDEPENDENT_AMBULATORY_CARE_PROVIDER_SITE_OTHER): Payer: Medicare Other | Admitting: Internal Medicine

## 2012-02-22 ENCOUNTER — Encounter: Payer: Self-pay | Admitting: Internal Medicine

## 2012-02-22 ENCOUNTER — Telehealth: Payer: Self-pay | Admitting: Internal Medicine

## 2012-02-22 VITALS — BP 124/68 | HR 84 | Temp 98.5°F | Resp 20 | Wt 142.0 lb

## 2012-02-22 DIAGNOSIS — D509 Iron deficiency anemia, unspecified: Secondary | ICD-10-CM

## 2012-02-22 DIAGNOSIS — Z79899 Other long term (current) drug therapy: Secondary | ICD-10-CM

## 2012-02-22 DIAGNOSIS — E119 Type 2 diabetes mellitus without complications: Secondary | ICD-10-CM

## 2012-02-22 NOTE — Assessment & Plan Note (Signed)
Stable. Off iron. Obtain cbc and ferritin prior to next visit

## 2012-02-22 NOTE — Progress Notes (Signed)
Subjective:    Patient ID: Monique Fleming, female    DOB: Jun 17, 1929, 78 y.o.   MRN: [id number]  HPI Pt presents to clinic for followup of multiple medical problems. Switching to [facility] office due to proximity to home. H/o single sz with no recurrent followed by neurology (follows dilantin levels). Seeing psychiatry with recent medication adjustments.  H/o anemia and states released by H/O. CBC obtained 4/13 and stopped iron supplementation after then. FSBS generally under 130 with occasional hypoglycemia. Now taking minimal amaryl 1mg  qd. Has left hand intention tremor which is stable.   Past Medical History  Diagnosis Date  . Aortic stenosis     moderate by echo 6/11 gradient 20/74mmHg for mean and peak  . TIA (transient ischemic attack)     Dr. Pearlean Brownie  . Peripheral vascular disease   . IBS (irritable bowel syndrome)   . Diabetes mellitus     type II  . Chronic bronchitis   . COPD (chronic obstructive pulmonary disease)   . Atherosclerosis   . Depression   . Anxiety     Dr Donell Beers  . Renal cyst   . Cirrhosis   . Seizure disorder   . Anemia     iron deficiency  . Cellulitis   . Diverticular disease   . Infectious diarrhea   . Sinusitis   . Thrush   . Abnormal liver function test   . GERD (gastroesophageal reflux disease)   . Insomnia     chronic  . Adenocarcinoma 2008    colon,s/p right hemicolectomy  . Angina   . Seizures     hx of one seizure "  . Cirrhosis of liver     hx of   Past Surgical History  Procedure Date  . Carotid endarterectomy   . Colectomy     right hemi  . Cholecystectomy 11/09    Rt Breast  . Upper gastrointestinal endoscopy     reports that she has been smoking.  She has never used smokeless tobacco. She reports that she does not drink alcohol or use illicit drugs. family history includes Colon cancer in her paternal grandmother; Diabetes in her brother; Esophageal cancer in her brother; Heart disease in her mother; and Hypertension in her father  and unspecified family member. Allergies  Allergen Reactions  . Levofloxacin Other (See Comments)    seizure  . Pneumovax (Pneumococcal Polysaccharides) Other (See Comments)    Severe allergy-cellulitis  . Amoxicillin-Pot Clavulanate Other (See Comments)    Patient stated Drs. recommend taking Augmentin due to her PCN allergy.  . Penicillins Rash    unknown  . Haldol (Haloperidol Lactate)     Had unknown reaction many yrs ago per pt  . Morphine And Related     Confused and agitation  . Sudafed (Pseudoephedrine Hcl) Other (See Comments)    seizure      Review of Systems see hpi     Objective:   Physical Exam  Nursing note and vitals reviewed. Constitutional: She appears well-developed and well-nourished. No distress.  HENT:  Head: Normocephalic and atraumatic.  Eyes: Conjunctivae are normal.  Neck: Neck supple.  Cardiovascular: Normal rate, regular rhythm and normal heart sounds.   Pulmonary/Chest: Effort normal and breath sounds normal. No respiratory distress. She has no wheezes. She has no rales.  Neurological: She is alert.  Skin: She is not diaphoretic.  Psychiatric: She has a normal mood and affect.          Assessment & Plan:

## 2012-02-22 NOTE — Assessment & Plan Note (Signed)
Stable. Reviewed s/s hypoglycemia. Instructed to keep piece of candy or glucose tab on person. Obtain chem7, a1c prior to next visit

## 2012-02-22 NOTE — Patient Instructions (Signed)
Please schedule labs prior to next visit Cbc, ferritin- anemia, chem7, a1c-250.0 and lft-v58.69

## 2012-02-23 NOTE — Telephone Encounter (Signed)
Lab orders entered for August 2013. 

## 2012-03-28 ENCOUNTER — Ambulatory Visit: Payer: Medicare Other | Admitting: Endocrinology

## 2012-03-29 ENCOUNTER — Encounter: Payer: Self-pay | Admitting: Internal Medicine

## 2012-03-29 ENCOUNTER — Ambulatory Visit (INDEPENDENT_AMBULATORY_CARE_PROVIDER_SITE_OTHER): Payer: Medicare Other | Admitting: Internal Medicine

## 2012-03-29 VITALS — BP 100/70 | HR 80 | Temp 98.4°F | Resp 22

## 2012-03-29 DIAGNOSIS — N390 Urinary tract infection, site not specified: Secondary | ICD-10-CM

## 2012-03-29 DIAGNOSIS — R300 Dysuria: Secondary | ICD-10-CM

## 2012-03-29 LAB — POCT URINALYSIS DIPSTICK
Bilirubin, UA: NEGATIVE
Glucose, UA: NEGATIVE
Nitrite, UA: POSITIVE
Spec Grav, UA: 1.02

## 2012-03-29 MED ORDER — SULFAMETHOXAZOLE-TRIMETHOPRIM 800-160 MG PO TABS: 1.0000 | ORAL_TABLET | Freq: Two times a day (BID) | ORAL | Status: DC

## 2012-03-31 ENCOUNTER — Encounter (HOSPITAL_BASED_OUTPATIENT_CLINIC_OR_DEPARTMENT_OTHER): Payer: Self-pay | Admitting: *Deleted

## 2012-03-31 ENCOUNTER — Telehealth: Payer: Self-pay | Admitting: *Deleted

## 2012-03-31 ENCOUNTER — Emergency Department (HOSPITAL_BASED_OUTPATIENT_CLINIC_OR_DEPARTMENT_OTHER): Payer: Medicare Other

## 2012-03-31 ENCOUNTER — Emergency Department (HOSPITAL_BASED_OUTPATIENT_CLINIC_OR_DEPARTMENT_OTHER)
Admission: EM | Admit: 2012-03-31 | Discharge: 2012-03-31 | Disposition: A | Discharge status: Home / Self Care | Payer: Medicare Other | Attending: Emergency Medicine | Admitting: Emergency Medicine

## 2012-03-31 DIAGNOSIS — K219 Gastro-esophageal reflux disease without esophagitis: Secondary | ICD-10-CM | POA: Insufficient documentation

## 2012-03-31 DIAGNOSIS — R350 Frequency of micturition: Secondary | ICD-10-CM | POA: Insufficient documentation

## 2012-03-31 DIAGNOSIS — F411 Generalized anxiety disorder: Secondary | ICD-10-CM | POA: Insufficient documentation

## 2012-03-31 DIAGNOSIS — F3289 Other specified depressive episodes: Secondary | ICD-10-CM | POA: Insufficient documentation

## 2012-03-31 DIAGNOSIS — T370X5A Adverse effect of sulfonamides, initial encounter: Secondary | ICD-10-CM | POA: Insufficient documentation

## 2012-03-31 DIAGNOSIS — E119 Type 2 diabetes mellitus without complications: Secondary | ICD-10-CM | POA: Insufficient documentation

## 2012-03-31 DIAGNOSIS — Z79899 Other long term (current) drug therapy: Secondary | ICD-10-CM | POA: Insufficient documentation

## 2012-03-31 DIAGNOSIS — T50905A Adverse effect of unspecified drugs, medicaments and biological substances, initial encounter: Secondary | ICD-10-CM

## 2012-03-31 DIAGNOSIS — F329 Major depressive disorder, single episode, unspecified: Secondary | ICD-10-CM | POA: Insufficient documentation

## 2012-03-31 DIAGNOSIS — R079 Chest pain, unspecified: Secondary | ICD-10-CM | POA: Insufficient documentation

## 2012-03-31 DIAGNOSIS — J441 Chronic obstructive pulmonary disease with (acute) exacerbation: Secondary | ICD-10-CM | POA: Insufficient documentation

## 2012-03-31 LAB — URINALYSIS, ROUTINE W REFLEX MICROSCOPIC
Bilirubin Urine: NEGATIVE
Glucose, UA: NEGATIVE mg/dL
Hgb urine dipstick: NEGATIVE
Nitrite: NEGATIVE
Specific Gravity, Urine: 1.006 (ref 1.005–1.030)
pH: 6.5 (ref 5.0–8.0)

## 2012-03-31 LAB — CBC WITH DIFFERENTIAL/PLATELET
HCT: 37.9 % (ref 36.0–46.0)
Hemoglobin: 12.9 g/dL (ref 12.0–15.0)
Lymphocytes Relative: 21 % (ref 12–46)
Lymphs Abs: 1.6 10*3/uL (ref 0.7–4.0)
Monocytes Absolute: 0.8 10*3/uL (ref 0.1–1.0)
Monocytes Relative: 10 % (ref 3–12)
Neutro Abs: 5 10*3/uL (ref 1.7–7.7)
Neutrophils Relative %: 65 % (ref 43–77)
RBC: 4.55 MIL/uL (ref 3.87–5.11)

## 2012-03-31 LAB — BASIC METABOLIC PANEL
BUN: 6 mg/dL (ref 6–23)
CO2: 27 mEq/L (ref 19–32)
Chloride: 102 mEq/L (ref 96–112)
Creatinine, Ser: 0.8 mg/dL (ref 0.50–1.10)
GFR calc Af Amer: 79 mL/min — ABNORMAL LOW (ref >90)
Glucose, Bld: 100 mg/dL — ABNORMAL HIGH (ref 70–99)
Potassium: 3.2 mEq/L — ABNORMAL LOW (ref 3.5–5.1)

## 2012-03-31 LAB — GLUCOSE, CAPILLARY
Glucose-Capillary: 177 mg/dL — ABNORMAL HIGH (ref 70–99)
Glucose-Capillary: 87 mg/dL (ref 70–99)

## 2012-03-31 LAB — LACTIC ACID, PLASMA: Lactic Acid, Venous: 1.1 mmol/L (ref 0.5–2.2)

## 2012-03-31 MED ORDER — POTASSIUM CHLORIDE CRYS ER 20 MEQ PO TBCR
40.0000 meq | EXTENDED_RELEASE_TABLET | Freq: Once | ORAL | Status: AC
  Administered 2012-03-31: 40 meq via ORAL
  Filled 2012-03-31: qty 2

## 2012-03-31 MED ORDER — PREDNISONE 50 MG PO TABS
60.0000 mg | ORAL_TABLET | Freq: Once | ORAL | Status: AC
  Administered 2012-03-31: 60 mg via ORAL
  Filled 2012-03-31: qty 1

## 2012-03-31 MED ORDER — CEPHALEXIN 500 MG PO CAPS: 500.0000 mg | ORAL_CAPSULE | Freq: Four times a day (QID) | ORAL | Status: DC

## 2012-03-31 MED ORDER — PREDNISONE 20 MG PO TABS: 60.0000 mg | ORAL_TABLET | Freq: Once | ORAL | Status: DC

## 2012-03-31 MED ORDER — ALBUTEROL SULFATE HFA 108 (90 BASE) MCG/ACT IN AERS: 1.0000 | INHALATION_SPRAY | Freq: Four times a day (QID) | RESPIRATORY_TRACT | Status: DC | PRN

## 2012-03-31 MED ORDER — IPRATROPIUM BROMIDE 0.02 % IN SOLN
0.5000 mg | Freq: Once | RESPIRATORY_TRACT | Status: AC
  Administered 2012-03-31: 0.5 mg via RESPIRATORY_TRACT
  Filled 2012-03-31: qty 2.5

## 2012-03-31 MED ORDER — ALBUTEROL SULFATE (5 MG/ML) 0.5% IN NEBU
5.0000 mg | INHALATION_SOLUTION | Freq: Once | RESPIRATORY_TRACT | Status: AC
  Administered 2012-03-31: 5 mg via RESPIRATORY_TRACT
  Filled 2012-03-31: qty 1

## 2012-03-31 MED ORDER — CEPHALEXIN 250 MG PO CAPS
500.0000 mg | ORAL_CAPSULE | Freq: Once | ORAL | Status: AC
  Administered 2012-03-31: 500 mg via ORAL
  Filled 2012-03-31: qty 2

## 2012-03-31 NOTE — ED Provider Notes (Signed)
History     CSN: [id number]  Arrival date & time 03/31/12  1224   First MD Initiated Contact with Patient 03/31/12 1340      Chief Complaint  Patient presents with  . Shortness of Breath    (Consider location/radiation/quality/duration/timing/severity/associated sxs/prior treatment) HPI Patient is an 78 year old female with a history of multiple medical problems including COPD and continued smoking who presents today complaining of shortness of breath. Patient reports that this began last night. 2 days ago she was seen by her primary care provider and diagnosed with a urinary tract infection. She was started on Bactrim for this. Patient feels that her symptoms began after taking this medication at home last night. Initially the patient is noted to be wheezing she reports that she always wheezes. Patient has numerous drug allergies and feels that her symptoms are likely a drug reaction. Patient had fingerstick of 177 upon arrival. She was afebrile and hemodynamically stable. She denied any chest pain and has no history of coronary artery disease despite all of her other medical problems. She denies fevers but does endorse some cough. Patient has no history of DVT or PE. She denies history of heart failure. Patient is not currently on any anticoagulation for any reason. She continues to smoke 7-8 cigarettes a day. Exertion appears to make her shortness of breath worse and nothing makes it better. There are no other associated or modifying factors. Past Medical History  Diagnosis Date  . Aortic stenosis     moderate by echo 6/11 gradient 20/16mmHg for mean and peak  . TIA (transient ischemic attack)     Dr. Pearlean Brownie  . Peripheral vascular disease   . IBS (irritable bowel syndrome)   . Diabetes mellitus     type II  . Chronic bronchitis   . COPD (chronic obstructive pulmonary disease)   . Atherosclerosis   . Depression   . Anxiety     Dr Donell Beers  . Renal cyst   . Cirrhosis   . Seizure  disorder   . Anemia     iron deficiency  . Cellulitis   . Diverticular disease   . Infectious diarrhea   . Sinusitis   . Thrush   . Abnormal liver function test   . GERD (gastroesophageal reflux disease)   . Insomnia     chronic  . Adenocarcinoma 2008    colon,s/p right hemicolectomy  . Angina   . Seizures     hx of one seizure "  . Cirrhosis of liver     hx of    Past Surgical History  Procedure Date  . Carotid endarterectomy   . Colectomy     right hemi  . Cholecystectomy 11/09    Rt Breast  . Upper gastrointestinal endoscopy     Family History  Problem Relation Age of Onset  . Hypertension    . Colon cancer Paternal Grandmother   . Esophageal cancer Brother   . Heart disease Mother   . Diabetes Brother   . Hypertension Father     History  Substance Use Topics  . Smoking status: Current Everyday Smoker -- 0.5 packs/day    Last Attempt to Quit: 04/14/2011  . Smokeless tobacco: Never Used   Comment: Using Electronic cigarette  . Alcohol Use: No    OB History    Grav Para Term Preterm Abortions TAB SAB Ect Mult Living                  Review  of Systems  Constitutional: Negative.   HENT: Negative.   Eyes: Negative.   Respiratory: Positive for cough and shortness of breath.   Cardiovascular: Negative for chest pain, palpitations and leg swelling.  Gastrointestinal: Negative.   Genitourinary: Positive for frequency.  Musculoskeletal: Negative.   Skin: Negative.   Neurological: Negative.   Hematological: Negative.   Psychiatric/Behavioral: Negative.   All other systems reviewed and are negative.    Allergies  Bactrim; Levofloxacin; Pneumovax; Amoxicillin-pot clavulanate; Penicillins; Haldol; Morphine and related; and Sudafed  Home Medications   Current Outpatient Rx  Name Route Sig Dispense Refill  . BUDESONIDE-FORMOTEROL FUMARATE 80-4.5 MCG/ACT IN AERO Inhalation Inhale 2 puffs into the lungs 2 (two) times daily. Rinse mouth 3 Inhaler 3  .  VITAMIN D3 1000 UNITS PO TABS Oral Take 1,000 Units by mouth daily.      Marland Kitchen CLONAZEPAM 0.5 MG PO TABS Oral Take 0.5 mg by mouth. Take on tablet in the morning and up to 1 and  1/2  (1.5 MG total) at bedtime as neededFor sleep    . CLOPIDOGREL BISULFATE 75 MG PO TABS Oral Take 1 tablet (75 mg total) by mouth daily. 90 tablet 3  . VITAMIN B-12 500 MCG SL SUBL Sublingual Place 1 tablet (500 mcg total) under the tongue 1 day or 1 dose. 100 tablet 3  . ESOMEPRAZOLE MAGNESIUM 40 MG PO CPDR Oral Take 40 mg by mouth 2 (two) times daily. Patient uses this medication for acid reflux.    Marland Kitchen GLIMEPIRIDE 1 MG PO TABS Oral Take 1 mg by mouth 2 (two) times daily.     Marland Kitchen GLUCOSE BLOOD VI STRP  Use as instructed 100 each 3  . NITROGLYCERIN 0.4 MG SL SUBL Sublingual Place 1 tablet (0.4 mg total) under the tongue every 5 (five) minutes as needed for chest pain (Up to 3 doses. If you have to take the 3rd dose, call 911.). 25 tablet 4  . PHENYTOIN SODIUM EXTENDED 100 MG PO CAPS Oral Take 100 mg by mouth daily. Take 100 mg every  Morning and 200 mg at bedtime    . POTASSIUM CHLORIDE 10 MEQ PO TBCR Oral Take 20 mEq by mouth 2 (two) times daily.    . SULFAMETHOXAZOLE-TRIMETHOPRIM 800-160 MG PO TABS Oral Take 1 tablet by mouth 2 (two) times daily. 14 tablet 0  . ALBUTEROL SULFATE HFA 108 (90 BASE) MCG/ACT IN AERS Inhalation Inhale 1-2 puffs into the lungs every 6 (six) hours as needed for wheezing. 1 Inhaler 0  . CEPHALEXIN 500 MG PO CAPS Oral Take 1 capsule (500 mg total) by mouth 4 (four) times daily. 20 capsule 0  . PREDNISONE 20 MG PO TABS Oral Take 3 tablets (60 mg total) by mouth once. 15 tablet 0    BP 146/75  Pulse 87  Temp 97.7 F (36.5 C) (Oral)  Resp 18  SpO2 98%  Physical Exam  Nursing note and vitals reviewed. GEN: Well-developed, pleasant elderly female in no distress HEENT: Atraumatic, normocephalic.  EYES: PERRLA BL, no scleral icterus. NECK: Trachea midline, no meningismus CV: regular rate and  rhythm. No murmurs, rubs, or gallops PULM: No respiratory distress. Diminished throughout with diffuse wheezing  GI: soft, non-tender. No guarding, rebound, or tenderness. + bowel sounds  GU: deferred Neuro: cranial nerves grossly 2-12 intact, no abnormalities of strength or sensation, A and O x 3 MSK: Patient moves all 4 extremities symmetrically, no deformity, edema, or injury noted Skin: No rashes petechiae, purpura, or jaundice Psych:  no abnormality of mood   ED Course  Procedures (including critical care time)  Indication: shortness of breath Please note this EKG was reviewed extemporaneously by myself.   Date: 03/31/2012  Rate: 80  Rhythm: sinus arrhythmia and premature atrial contractions (PAC)  QRS Axis: normal  Intervals: normal  ST/T Wave abnormalities: nonspecific ST/T changes  Conduction Disutrbances:none  Narrative Interpretation:   Old EKG Reviewed: unchanged      Labs Reviewed  GLUCOSE, CAPILLARY - Abnormal; Notable for the following:    Glucose-Capillary 177 (*)     All other components within normal limits  BASIC METABOLIC PANEL - Abnormal; Notable for the following:    Potassium 3.2 (*)     Glucose, Bld 100 (*)     GFR calc non Af Amer 68 (*)     GFR calc Af Amer 79 (*)     All other components within normal limits  CBC WITH DIFFERENTIAL  URINALYSIS, ROUTINE W REFLEX MICROSCOPIC  TROPONIN I  LACTIC ACID, PLASMA  GLUCOSE, CAPILLARY   Dg Chest 2 View  03/31/2012  *RADIOLOGY REPORT*  Clinical Data: Shortness of breath.  CHEST - 2 VIEW  Comparison: 08/27/2011.  Findings: Trachea is midline.  Heart size normal.  Lungs appear mildly emphysematous.  Tiny nodular densities in the left upper lobe appear minimally more prominent than on prior studies.  Lungs are otherwise clear.  No pleural fluid.  IMPRESSION: Faint tiny left upper lobe nodular densities appear mildly more prominent than on prior examinations with no correlate identified on 03/09/2011.  Attention  on follow-up exams or repeat CT chest without contrast suggested, as clinically indicated.  Original Report Authenticated By: Reyes Ivan, M.D.     1. COPD with acute exacerbation   2. Medication reaction       MDM  Patient was evaluated by myself. Based on evaluation patient had workup for shortness of breath. She had no chest pain whatsoever. However given her age I did perform a troponin as well as CBC, BMP, EKG, chest x-ray, and lactate. EKG was unchanged from previous. The initial fingerstick was 177 on metabolic panel this was 100. Patient had some mild hypokalemia but was replaced orally with potassium. Urinalysis showed absolutely no signs of urinary tract infection however patient did have urine culture did grown out Escherichia coli. This was from June 25 and sensitivities were still pending. Patient had no signs of anemia or chest x-ray findings to explain her shortness of breath. She was noted to have slight increase in the number of pulmonary nodules previously seen on her chest x-ray. Patient and I discussed this finding and that she will need to have a followup chest CT. Patient is to notify her primary care provider this. This was both relayed verbally to the patient in place in her discharge instructions. I did not feel that a three-hour troponin was necessary as patient had symptoms for greater than 12 hours and was chest pain-free for the entire or episode of her shortness of breath. Patient did receive one albuterol and Atrovent treatment as well as prednisone and was feeling much better. Upon reassessment she was able to move air well with no appreciable wheezing. Patient continued to complain of some urinary frequency and though her urinalysis was negative her culture had been positive previously and I did not want her to have return of her symptoms. Patient's Bactrim was discontinued as she felt that this had precipitated her reaction. Patient has no issues of Keflex in the  past and she was started on this. A prescription for 5 days was given. Patient was discharged in good condition. She was told to return if she had any other emergent concerns. She's otherwise to followup as soon as possible with her primary care provider regarding her findings today. She was discharged with a prescription for prednisone as well and given an inhaler to ensure that she had this medication at home.        Cyndra Numbers, MD 04/02/12 [phone]

## 2012-03-31 NOTE — Discharge Instructions (Signed)
Chronic Obstructive Pulmonary Disease Exacerbation Chronic obstructive pulmonary disease (COPD) is a condition that limits airflow. COPD may include chronic bronchitis, pulmonary emphysema, or both. A COPD exacerbation means that your COPD has gotten worse. Without treatment, this can be a life-threatening problem. COPD exacerbation requires immediate medical care. CAUSES  COPD exacerbation can be caused by:  Exposure to smoke.   Exposure to air pollution, chemical fumes, or dust.   Respiratory infections.   Genetics, particularly alpha 1-antitrypsin deficiency.   A condition in which the body's immune system attacks itself (autoimmunity).  SYMPTOMS   Increased coughing.   Increased wheezing.   Increased shortness of breath.   Swelling due to a buildup of fluid (peripheral edema) related to heart strain.   Rapid breathing.   Chest enlargement (barrel chest).   Chest tightness.  DIAGNOSIS  There is no single test that can diagnosis COPD exacerbation. Your history, physical exam, and other tests will help your caregiver make a diagnosis. Tests may include a chest X-ray, pulmonary function tests, spirometry, basic lab tests, and an arterial blood gas test. TREATMENT  Severe problems may require a stay in the hospital. Depending on the cause of your problems, the following may be prescribed:  Antibiotic medicines.   Bronchodilators (inhaled or tablets).   Cortisone medicines (inhaled or tablets).   Supplemental oxygen therapy.   Pulmonary rehabilitation. This is a broad program that may involve exercise, nutrition counseling, breathing techniques, and further education about your condition.  It is important to use good technique with inhaled medicines. Spacer devices may be needed to help improve drug delivery. HOME CARE INSTRUCTIONS   Do not smoke. Quitting smoking is very important to prevent worsening of COPD.   Avoid exposure to all substances that irritate the airway,  especially tobacco smoke.   If prescribed, take your antibiotics as directed. Finish them even if you start to feel better.   Only take over-the-counter or prescription medicines as directed by your caregiver.   Drink enough fluids to keep your urine clear or pale yellow. This can help thin bronchial secretions.   Use a cool mist vaporizer. This makes it easier to clear your chest when you cough.   If you have a home nebulizer and oxygen, continue to use them as directed.   Maintain all necessary vaccinations to prevent infections.   Exercise regularly.   Eat a healthy diet.   Keep all follow-up appointments as directed by your caregiver.  SEEK IMMEDIATE MEDICAL CARE IF:  You have extreme shortness of breath.   You have severe chest pain or blood in your sputum.   You have a high fever, weakness, repeated vomiting, or fainting.   You feel confused.  MAKE SURE YOU:   Understand these instructions.   Will watch your condition.   Will get help right away if you are not doing well or get worse.  Document Released: 07/19/2007 Document Revised: 09/10/2011 Document Reviewed: 05/19/2011 [facility] Patient Information 2012 [place], [place].Metered Dose Inhaler (No Spacer Used) Inhaled medicines are the basis of asthma treatment and other breathing problems. Inhaled medicine can only be effective if used properly. Good technique assures that the medicine reaches the lungs. Metered dose inhalers (MDIs) are used to deliver a variety of inhaled medicines. These include quick relief medicines, controller medicines (such as corticosteroids), and cromolyn. The medicine is delivered by pushing down on a metal canister to release a set amount of spray.  If you are using different kinds of inhalers, use your quick  relief medicine to open the airways 10 to 15 minutes before using a steroid. If you are unsure which inhalers to use and the order of using them, ask your caregiver, nurse, or respiratory  therapist. HOW TO USE THE INHALER 1. Remove cap from inhaler.  2. Shake inhaler for 5 seconds before each inhalation (breathing in).  3. Position the inhaler so that the top of the canister faces up.  4. Put your index finger on the top of the medication canister. Your thumb supports the bottom of the inhaler.  5. Open your mouth.  6. Hold the inhaler 1 to 2 inches away from your open mouth. This allows the medicine to slow down before the medicine enters the mouth.  7. Exhale (breathe out) normally and as completely as possible.  8. Press the canister down with the index finger to release the medication.  9. At the same time as the canister is pressed, inhale deeply and slowly until the lungs are completely filled. This should take 4 to 6 seconds. Keep your tongue down.  10. Hold the medication in your lungs for up to 10 seconds (10 seconds is best). This helps the medicine get into the small airways of your lungs to work better.  11. Breathe out slowly, through pursed lips. Whistling is an example of pursed lips.  12. Wait at least 1 minute between puffs. Continue with the above steps until you have taken the number of puffs your caregiver has ordered.  13. Replace cap on inhaler.  AVOID:  Inhaling before or after starting the spray of medicine. It takes practice to coordinate your breathing with triggering the spray.   Inhaling through the nose (rather than the mouth) when triggering the spray.  HOW TO DETERMINE IF YOUR INHALER IS FULL OR NEARLY EMPTY:  Determine when an inhaler is empty. You cannot know when an MDI canister is empty by shaking it. A few MDIs are now being made with dose counters. Ask your caregiver for a prescription that has a dose counter if you feel you need that extra help.   If your inhaler does not have a counter, check the number of doses in the inhaler before you use it. The canister or box will list the number of doses in the canister. Divide the total number of  doses in the canister by the number you will use each day to find how many days the canister will last. (For example, if your canister has 200 doses and you take 2 puffs, 4 times each day, which is 8 puffs a day. Dividing 200 by 8 equals 25. The canister should last 25 days.) Using a calendar, count forward that many days to see when your inhaler will run out. Write the refill date on a calendar or your canister.   Remember, if you need to take extra doses, the inhaler will empty sooner than you figured. Be sure you have a refill before your canister runs out. Refill your inhaler 7 to 10 days before it runs out.  HOME CARE INSTRUCTIONS   Do not use the inhaler more than your caregiver tells you. If you are still wheezing and are feeling tightness in your chest, call your caregiver.   Keep an adequate supply of medication. This includes making sure the medicine is not expired, and you have a spare MDI.   Follow your caregiver or inhaler insert directions for cleaning the inhaler.  SEEK MEDICAL CARE IF:   Symptoms are only partially  relieved with your inhalers.   You are having trouble using your inhalers.   You experience some increase in phlegm.   You develop a fever of 102 F (38.9 C).  SEEK IMMEDIATE MEDICAL CARE IF:   You feel little or no relief with your inhalers. You are still wheezing and are feeling shortness of breath and/or tightness in your chest.   You have side effects such as dizziness, headaches, or fast heart rate.   You have chills, fever, night sweats or an oral temperature above 102 F (38.9 C) develops.   Phlegm production increases a lot, or there is blood in the phlegm.  MAKE SURE YOU:   Understand these instructions.   Will watch your condition.   Will get help right away if you are not doing well or get worse.  Document Released: 07/19/2007 Document Revised: 09/10/2011 Document Reviewed: 07/09/2009 [facility] Patient Information 2012 [place], [place].Drug  Allergy Allergic reactions to medicines are common. Some allergic reactions are mild. A delayed type of drug allergy that occurs 1 week or more after exposure to a medicine or vaccine is called serum sickness. A life-threatening, sudden (acute) allergic reaction that involves the whole body is called anaphylaxis. CAUSES  "True" drug allergies occur when there is an allergic reaction to a medicine. This is caused by overactivity of the immune system. First, the body becomes sensitized. The immune system is triggered by your first exposure to the medicine. Following this first exposure, future exposure to the same medicine may be life-threatening. Almost any medicine can cause an allergic reaction. Common ones are:  Penicillin.   Sulfonamides (sulfa drugs).   Local anesthetics.   X-ray dyes that contain iodine.  SYMPTOMS  Common symptoms of a minor allergic reaction are:  Swelling around the mouth.   An itchy red rash or hives.   Vomiting or diarrhea.  Anaphylaxis can cause swelling of the mouth and throat. This makes it difficult to breathe and swallow. Severe reactions can be fatal within seconds, even after exposure to only a trace amount of the drug that causes the reaction. HOME CARE INSTRUCTIONS   If you are unsure of what caused your reaction, keep a diary of foods and medicines used. Include the symptoms that followed. Avoid anything that causes reactions.   You may want to follow up with an allergy specialist after the reaction has cleared in order to be tested to confirm the allergy. It is important to confirm that your reaction is an allergy, not just a side effect to the medicine. If you have a true allergy to a medicine, this may prevent that medicine and related medicines from being given to you when you are very ill.   If you have hives or a rash:   Take medicines as directed by your caregiver.   You may use an over-the-counter antihistamine (diphenhydramine) as needed.     Apply cold compresses to the skin or take baths in cool water. Avoid hot baths or showers.   If you are severely allergic:   Continuous observation after a severe reaction may be needed. Hospitalization is often required.   Wear a medical alert bracelet or necklace stating your allergy.   You and your family must learn how to use an anaphylaxis kit or give an epinephrine injection to temporarily treat an emergency allergic reaction. If you have had a severe reaction, always carry your epinephrine injection or anaphylaxis kit with you. This can be lifesaving if you have a severe reaction.  Do not drive or perform tasks after treatment until the medicines used to treat your reaction have worn off, or until your caregiver says it is okay.  SEEK MEDICAL CARE IF:   You think you had an allergic reaction. Symptoms usually start within 30 minutes after exposure.   Symptoms are getting worse rather than better.   You develop new symptoms.   The symptoms that brought you to your caregiver return.  SEEK IMMEDIATE MEDICAL CARE IF:   You have swelling of the mouth, difficulty breathing, or wheezing.   You have a tight feeling in your chest or throat.   You develop hives, swelling, or itching all over your body.   You develop severe vomiting or diarrhea.   You feel faint or pass out.  This is an emergency. Use your epinephrine injection or anaphylaxis kit as you have been instructed. Call for emergency medical help. Even if you improve after the injection, you need to be examined at a hospital emergency department. MAKE SURE YOU:   Understand these instructions.   Will watch your condition.   Will get help right away if you are not doing well or get worse.  Document Released: 09/21/2005 Document Revised: 09/10/2011 Document Reviewed: 02/25/2011 [facility] Patient Information 2012 [place], [place].

## 2012-03-31 NOTE — ED Notes (Signed)
Patient ambulatory with assistance around the nursing station; SaO2 95%.

## 2012-03-31 NOTE — ED Notes (Signed)
CBG 87 reported this to RN

## 2012-03-31 NOTE — Telephone Encounter (Signed)
Patient called stating she was seen on Tuesday, and was given medication. She stated she believes that she may be allergic to the medication. She reports having breathing difficulty, she states she is unable to deep breathe, and feels like she is suffocating. She denies that she feels clammy, and did not report neck /jaw, chest , or left arm pain. She did state that he blood sugars have been up and down. Patient was advised to due her reporting sever shortness of breath she was advised to be seen at the nearest emergency room.Patient has verbalized understanding and agrees as instructed no further action is required.

## 2012-03-31 NOTE — ED Notes (Signed)
Pt states she feels like she has been short of breath since being put on a new medicine for a uti pt has been ambulatory in waiting room triage room and up and down the hallways with no distress observed. Pt states she just feels like she cant get a good breath and wants to be sure she doesn't need to get a different medication for the uti Pt cbg in triage was 177

## 2012-04-01 LAB — URINE CULTURE: Colony Count: >=100000

## 2012-04-04 ENCOUNTER — Telehealth: Payer: Self-pay | Admitting: Internal Medicine

## 2012-04-04 DIAGNOSIS — R911 Solitary pulmonary nodule: Secondary | ICD-10-CM

## 2012-04-04 NOTE — Telephone Encounter (Signed)
Spoke with patient aware that OV has been schedule with CY at 1115am on Tuesday 04-05-12. Ct Chest with contrast scheduled for Tuesday 04-05-12 at 10am; pt aware NPO after 8am and arrive at 945am at [facility].

## 2012-04-04 NOTE — Telephone Encounter (Signed)
Per CY-okay to order CT chest with contrast for Dx Lung Nodule; BMET should have been done at [place]-can call to get those results. Also, patient can come in on Thursday 04-14-12 at 1115 am.

## 2012-04-04 NOTE — Telephone Encounter (Signed)
I spoke with pt and she states she went to the ED on 03/31/12 over at the [facility] med center. Pt was dx w/ copd w/ acute exacerbation. Pt states she was told she needed to f/u with Dr. Maple Hudson as soon as possible and would needs a CT scan to evaluate her nodules per pt. Please advise Dr. Maple Hudson thanks

## 2012-04-04 NOTE — Telephone Encounter (Signed)
I called the pt to advise of appt and that I was placing the order for the CT and the pt states that 04-14-12 is too far out she states she needs an appt tomorrow, that is the only time her daughter can bring her. She states when she went to the ER on 04-02-12 she was given prednisone and keflex but she does not feel these medications are working. She states she is more SOB with rest and exertion then she was when she went to ER and is concerned about this. Pt is requesting an appt for tomorrow. Please advise. Carron Curie, CMA Allergies  Allergen Reactions  . Bactrim (Sulfamethoxazole W-Trimethoprim) Shortness Of Breath  . Levofloxacin Other (See Comments)    seizure  . Pneumovax (Pneumococcal Polysaccharides) Other (See Comments)    Severe allergy-cellulitis  . Amoxicillin-Pot Clavulanate Other (See Comments)    Patient stated Drs. recommend taking Augmentin due to her PCN allergy.  . Penicillins Rash    unknown  . Haldol (Haloperidol Lactate)     Had unknown reaction many yrs ago per pt  . Morphine And Related     Confused and agitation  . Sudafed (Pseudoephedrine Hcl) Other (See Comments)    seizure

## 2012-04-05 ENCOUNTER — Ambulatory Visit (INDEPENDENT_AMBULATORY_CARE_PROVIDER_SITE_OTHER)
Admission: RE | Admit: 2012-04-05 | Discharge: 2012-04-05 | Disposition: A | Discharge status: Home / Self Care | Payer: Medicare Other | Source: Ambulatory Visit | Attending: Internal Medicine | Admitting: Internal Medicine

## 2012-04-05 ENCOUNTER — Encounter: Payer: Self-pay | Admitting: Internal Medicine

## 2012-04-05 ENCOUNTER — Ambulatory Visit (INDEPENDENT_AMBULATORY_CARE_PROVIDER_SITE_OTHER): Payer: Medicare Other | Admitting: Internal Medicine

## 2012-04-05 ENCOUNTER — Ambulatory Visit: Payer: Medicare Other | Admitting: Internal Medicine

## 2012-04-05 VITALS — BP 132/62 | HR 67 | Ht 65.5 in | Wt 140.2 lb

## 2012-04-05 DIAGNOSIS — I7090 Unspecified atherosclerosis: Secondary | ICD-10-CM

## 2012-04-05 DIAGNOSIS — J449 Chronic obstructive pulmonary disease, unspecified: Secondary | ICD-10-CM

## 2012-04-05 DIAGNOSIS — R911 Solitary pulmonary nodule: Secondary | ICD-10-CM

## 2012-04-05 DIAGNOSIS — F17200 Nicotine dependence, unspecified, uncomplicated: Secondary | ICD-10-CM

## 2012-04-05 MED ORDER — IOHEXOL 300 MG/ML  SOLN
80.0000 mL | Freq: Once | INTRAMUSCULAR | Status: AC | PRN
  Administered 2012-04-05: 80 mL via INTRAVENOUS

## 2012-04-05 NOTE — Patient Instructions (Addendum)
The CT scan shows emphysema, atherosclerosis, and cirrhosis, but no nodules, no heart failure and no pneumonia  The prednisone is now fiished so we will try to stay off that for now.  Finish your keflex antibiotic  Continue the Symbicort maintenance controller- 2 puffs, then rinse mouth well, twice every day  Use either your nebulizer machine/ levalbuterol/ Xopenex 1.25 mg or the rescue inhaler albuterol/ Proventil  If needed. You can use some combination of these up to a total of 4 times in one day if needed.  Please call as needed

## 2012-04-05 NOTE — Progress Notes (Signed)
Patient ID: Monique Fleming, female    DOB: 1929/05/06, 78 y.o.   MRN: [id number]  HPI 46 yoF former smoker with COPD and multiple medical problems.  Last here November 05, 2010. Since then she reports staying off cigarettes and having no significant respiratory events. Pollen bothered more in March with nasal congestion, postnasal drip, but not much wheeze or cough. She feels current meds are adequate.  04/07/11- 81 yoF current smoker with COPD complicated by DM, ASCVD, hx colon cancer, anemia., GERD, esophageal varices. Was hosp for red, inflamed arm after Pneumovax with concern then that she had cellulits. Has had UTI then yeast from antibiotics- to see her GYN Has indigestion with known liver disease and esophageal varices.  Doesn't describe active respiratory complaints- mild occasional cough, dyspnea with exertion unchanged. We talked again about smoking and tried to motivate to full cessation. Smokes a few daily.  06/09/11- 63 yoF current smoker with COPD complicated by DM, ASCVD, hx colon cancer, anemia., GERD, esophageal varices. Reports doing well. Discussed flu shot after her sore arm from the pneumovax, and decided they were different enough that there should not be a similar reaction noted with flu vax.  She has not had significant recent cough, wheeze, phlegm, chest pain or palpitation.   08/11/11-  57 yoF current smoker with COPD complicated by DM, ASCVD, hx colon cancer, anemia., GERD, esophageal varices. She described her problems with bladder infection and C. difficile colitis requiring antibiotics almost continuously for the last 5 months. Her breathing has not changed with weather and season. She gets some raspy cough occasionally but rarely produces sputum. She denies chest pain or palpitation. She is still smoking an occasional cigarette I believe, but much less than before.  10/13/11- 18 yoF current smoker with COPD complicated by DM, ASCVD, hx colon cancer, anemia., GERD,  esophageal varices. Had a viral bronchitis syndrome, largely resolved now.  12/11/11- 82 yoF current smoker with COPD complicated by DM, ASCVD, hx colon cancer, anemia., GERD, esophageal varices Pharmacist suggested she blunt stimulation from  xopenex neb solution by diluting with a little water. She isn't sure the neb makes that much difference. Asks refill Symbicort. Reviewed her complex medical problem list. Not having acute respiratory symptoms now.  02/09/12- 65 yoF former smoker with COPD complicated by DM, ASCVD, hx colon cancer, anemia., GERD, esophageal varices  Pt states breathing is the same, no better or worse since last visit  She assures me that she is not smoking. Breathing feels comfortable with little routine cough or wheeze. Uses Symbicort twice daily. She has not been using her nebulizer machine. CXR 08/27/11 reviewed w/ her IMPRESSION:  Emphysematous changes and mild left lower lobe opacity, likely  scarring or atelectasis. No acute cardiopulmonary process  identified.  Original Report Authenticated By: Waneta Martins, M.D.   03/31/12- ER visit [facility]: HPI  Patient is an 78 year old female with a history of multiple medical problems including COPD and continued smoking who presents today complaining of shortness of breath. Patient reports that this began last night. 2 days ago she was seen by her primary care provider and diagnosed with a urinary tract infection. She was started on Bactrim for this. Patient feels that her symptoms began after taking this medication at home last night. Initially the patient is noted to be wheezing she reports that she always wheezes. Patient has numerous drug allergies and feels that her symptoms are likely a drug reaction. Patient had fingerstick of 177 upon  arrival. She was afebrile and hemodynamically stable. She denied any chest pain and has no history of coronary artery disease despite all of her other medical problems. She denies  fevers but does endorse some cough. Patient has no history of DVT or PE. She denies history of heart failure. Patient is not currently on any anticoagulation for any reason. She continues to smoke 7-8 cigarettes a day. Exertion appears to make her shortness of breath worse and nothing makes it better. There are no other associated or modifying factors.    04/05/12- 75 yoF former smoker with COPD complicated by DM, ASCVD, hx colon cancer, anemia., GERD, esophageal varices At ER - see note above: COPD exacerb, UTI. Small LUL nodules seemed larger>CT.   Daughter [name]  here Unfortunately she has admitted still smoking a few cigarettes daily against all our counseling.  COPD assessment test (CAT) 23/40 Emergency room visit June 27 for COPD exacerbation and UTI. She understood chest x-ray showed "nodules". When she called about it we ordered CT. She still feels tight on a prednisone taper ending today. Was on sulfa for UTI- changed to keflex.  CT chest 04/05/12- reviewed images and report with them IMPRESSION:  1. No acute findings.  2. Emphysema.  3. No pathologic adenopathy within the mediastinum or hilar  regions.  4. Prominent coronary artery calcifications  5. Cirrhosis.  Original Report Authenticated By: Rosealee Albee, M.D.   Review of Systems-See HPI Constitutional:   No weight loss, night sweats,  Fevers, chills,  fatigue, lassitude. HEENT:   No headaches,  Difficulty swallowing,  Tooth/dental problems,  Sore throat,                No sneezing, itching, ear ache, nasal congestion, post nasal drip,  CV:  No chest pain, orthopnea, PND, swelling in lower extremities, anasarca, dizziness, palpitations GI  No heartburn, indigestion, abdominal pain, nausea, vomiting,  Resp: + shortness of breath with routine exertion or at rest.  No excess mucus,   No coughing up of blood.  No change in color of mucus.  Skin: no rash or lesions. GU: MS:  No joint pain or swelling.  Marland Kitchen Psych:  No change in  mood or affect. + depression or anxiety.  No memory loss.  Objective:   Physical Exam General- Alert, Oriented, Affect-appropriate, Distress- none acute; calm and talkative today Skin- rash-none, lesions- none, excoriation- none. Dry.  Lymphadenopathy- none Head- atraumatic            Eyes- Gross vision intact, PERRLA, conjunctivae clear secretions            Ears- Hearing, canals-normal            Nose- Clear, no-Septal dev, mucus, polyps, erosion, perforation             Throat- Mallampati II , mucosa clear , drainage- none, tonsils- atrophic. Mild thrush Neck- flexible , trachea midline, no stridor , thyroid nl, carotid no bruit Chest - symmetrical excursion , unlabored           Heart/CV- RRR , 1-2/6 systolic precordial murmur , no gallop  , no rub, nl s1 s2                           - JVD- none , edema- none, stasis changes- none, varices- none           Lung-  distant, wheeze- none, + light cough , dullness-none, rub- none. Breathless  speech pattern seems chronic.           Chest wall-  Abd- Br/ Gen/ Rectal- Not done, not indicated Extrem- cyanosis- none, clubbing, none, atrophy- none, strength- nl Neuro- grossly intact to observation

## 2012-04-08 ENCOUNTER — Telehealth: Payer: Self-pay | Admitting: Internal Medicine

## 2012-04-08 MED ORDER — CLARITHROMYCIN 250 MG PO TABS: 250.0000 mg | ORAL_TABLET | Freq: Two times a day (BID) | ORAL | Status: AC

## 2012-04-08 MED ORDER — FLUCONAZOLE 150 MG PO TABS: 150.0000 mg | ORAL_TABLET | Freq: Once | ORAL | Status: AC

## 2012-04-08 NOTE — Telephone Encounter (Signed)
Called, spoke with pt.  She has 2 concerns for Dr. Maple Hudson:  1.  States when she was seen on April 05, 2012, he asked her to call if thrush did not improve.  Pt states this hasn't improved and would like something called in.  Pls advise. 2.  States last night temp 102.  Took tylenol with relief.  Temp 99.4 this am.  Pt states she is still taking keflex and thinks she will finish this tomorrow.  Reports breathing may be slightly better since OV on 04/05/12 and does have a prod cough with a small amount of white mucus but overall believes breathing is unchanged from 04/05/12 OV with Dr. Maple Hudson.  Requesting his thoughts on this.  Pls advise.  Thank you.  [place]  Allergies Verified:  Allergies  Allergen Reactions  . Bactrim (Sulfamethoxazole W-Trimethoprim) Shortness Of Breath  . Levofloxacin Other (See Comments)    seizure  . Pneumovax (Pneumococcal Polysaccharides) Other (See Comments)    Severe allergy-cellulitis  . Amoxicillin-Pot Clavulanate Other (See Comments)    Patient stated Drs. recommend taking Augmentin due to her PCN allergy.  . Penicillins Rash    unknown  . Haldol (Haloperidol Lactate)     Had unknown reaction many yrs ago per pt  . Morphine And Related     Confused and agitation  . Sudafed (Pseudoephedrine Hcl) Other (See Comments)    seizure

## 2012-04-08 NOTE — Telephone Encounter (Signed)
Per CY-give patient Diflucan 150 mg # 3 take 1 po qd no refills and Biaxin 250 mg #20 take 1 po bid with food(send to have on hand at pharmacy if needed) no refills.

## 2012-04-08 NOTE — Telephone Encounter (Signed)
I spoke with pt and is aware of CDY recs. Rx's have been sent to the pharmacy

## 2012-04-09 DIAGNOSIS — N390 Urinary tract infection, site not specified: Secondary | ICD-10-CM | POA: Insufficient documentation

## 2012-04-09 NOTE — Progress Notes (Signed)
  Subjective:    Patient ID: Monique Fleming, female    DOB: 01/12/1929, 78 y.o.   MRN: [id number]  HPI Pt presents to clinic for evaluation of possible UTI. Notes two day h/o burning with urination and increased urinary frequency. Denies n/v, f/c or hematuria. No alleviating or exacerbating factors. Taking no medication for the problem.  Past Medical History  Diagnosis Date  . Aortic stenosis     moderate by echo 6/11 gradient 20/55mmHg for mean and peak  . TIA (transient ischemic attack)     Dr. Pearlean Brownie  . Peripheral vascular disease   . IBS (irritable bowel syndrome)   . Diabetes mellitus     type II  . Chronic bronchitis   . COPD (chronic obstructive pulmonary disease)   . Atherosclerosis   . Depression   . Anxiety     Dr Donell Beers  . Renal cyst   . Cirrhosis   . Seizure disorder   . Anemia     iron deficiency  . Cellulitis   . Diverticular disease   . Infectious diarrhea   . Sinusitis   . Thrush   . Abnormal liver function test   . GERD (gastroesophageal reflux disease)   . Insomnia     chronic  . Adenocarcinoma 2008    colon,s/p right hemicolectomy  . Angina   . Seizures     hx of one seizure "  . Cirrhosis of liver     hx of   Past Surgical History  Procedure Date  . Carotid endarterectomy   . Colectomy     right hemi  . Cholecystectomy 11/09    Rt Breast  . Upper gastrointestinal endoscopy     reports that she quit smoking 11 days ago. Her smoking use included Cigarettes. She smoked .5 packs per day. She has never used smokeless tobacco. She reports that she does not drink alcohol or use illicit drugs. family history includes Colon cancer in her paternal grandmother; Diabetes in her brother; Esophageal cancer in her brother; Heart disease in her mother; and Hypertension in her father and unspecified family member. Allergies  Allergen Reactions  . Bactrim (Sulfamethoxazole W-Trimethoprim) Shortness Of Breath  . Levofloxacin Other (See Comments)    seizure    . Pneumovax (Pneumococcal Polysaccharides) Other (See Comments)    Severe allergy-cellulitis  . Amoxicillin-Pot Clavulanate Other (See Comments)    Patient stated Drs. recommend taking Augmentin due to her PCN allergy.  . Penicillins Rash    unknown  . Haldol (Haloperidol Lactate)     Had unknown reaction many yrs ago per pt  . Morphine And Related     Confused and agitation  . Sudafed (Pseudoephedrine Hcl) Other (See Comments)    seizure     Review of Systems see hpi     Objective:   Physical Exam  Nursing note and vitals reviewed. Constitutional: She appears well-developed and well-nourished. No distress.  Abdominal:       Slight suprapubic discomfort to palpation. No rebound/guarding.  Musculoskeletal:       No cva tenderness  Neurological: She is alert.  Skin: She is not diaphoretic.  Psychiatric: She has a normal mood and affect.          Assessment & Plan:

## 2012-04-09 NOTE — Assessment & Plan Note (Signed)
Obtain urine cx. Begin abx tx. Followup if no improvement or worsening.

## 2012-04-10 ENCOUNTER — Encounter: Payer: Self-pay | Admitting: Internal Medicine

## 2012-04-10 NOTE — Assessment & Plan Note (Addendum)
At least moderately severe COPD with recent exacerbation. She is just finishing a prednisone taper today after emergency room visit June 27. Managing both UTI and COPD exacerbation, she took sulfa drug and is now on Keflex. Plan-neb Xopenex. We reviewed medications with discussion and rewrote instructions.

## 2012-04-10 NOTE — Assessment & Plan Note (Signed)
She and daughter are aware of her coronary disease with no current chest pain, palpitation or fluid retention.

## 2012-04-10 NOTE — Assessment & Plan Note (Signed)
Spoke about this with daughter present. She assures me that she has stopped in the last few days with intention to smoke again.

## 2012-04-11 ENCOUNTER — Telehealth: Payer: Self-pay | Admitting: Internal Medicine

## 2012-04-11 ENCOUNTER — Encounter: Payer: Self-pay | Admitting: Physician Assistant

## 2012-04-11 ENCOUNTER — Other Ambulatory Visit (INDEPENDENT_AMBULATORY_CARE_PROVIDER_SITE_OTHER): Payer: Medicare Other

## 2012-04-11 ENCOUNTER — Ambulatory Visit: Payer: Medicare Other | Admitting: Internal Medicine

## 2012-04-11 ENCOUNTER — Ambulatory Visit (INDEPENDENT_AMBULATORY_CARE_PROVIDER_SITE_OTHER): Payer: Medicare Other | Admitting: Physician Assistant

## 2012-04-11 VITALS — BP 128/60 | HR 80 | Temp 98.2°F | Ht 65.0 in | Wt 137.6 lb

## 2012-04-11 DIAGNOSIS — R197 Diarrhea, unspecified: Secondary | ICD-10-CM

## 2012-04-11 LAB — BASIC METABOLIC PANEL
GFR: 42.97 mL/min — ABNORMAL LOW (ref >60.00)
Glucose, Bld: 127 mg/dL — ABNORMAL HIGH (ref 70–99)
Potassium: 4.1 mEq/L (ref 3.5–5.1)
Sodium: 140 mEq/L (ref 135–145)

## 2012-04-11 LAB — CBC WITH DIFFERENTIAL/PLATELET
Eosinophils Absolute: 0.3 10*3/uL (ref 0.0–0.7)
Eosinophils Relative: 1.7 % (ref 0.0–5.0)
Lymphocytes Relative: 9.3 % — ABNORMAL LOW (ref 12.0–46.0)
MCV: 85.1 fl (ref 78.0–100.0)
Monocytes Absolute: 1.1 10*3/uL — ABNORMAL HIGH (ref 0.1–1.0)
Neutrophils Relative %: 82.7 % — ABNORMAL HIGH (ref 43.0–77.0)
Platelets: 188 10*3/uL (ref 150.0–400.0)
WBC: 17.4 10*3/uL — ABNORMAL HIGH (ref 4.5–10.5)

## 2012-04-11 MED ORDER — METRONIDAZOLE 250 MG PO TABS: ORAL_TABLET | ORAL | Status: AC

## 2012-04-11 MED ORDER — SACCHAROMYCES BOULARDII 250 MG PO CAPS: 250.0000 mg | ORAL_CAPSULE | Freq: Two times a day (BID) | ORAL | Status: AC

## 2012-04-11 NOTE — Progress Notes (Signed)
Subjective:    Patient ID: Monique Fleming, female    DOB: [birth year], 78 y.o.   MRN: [id number]  HPI Monique Fleming is nice 78 year old white female known to Dr. Marina Fleming, with multiple medical problems including history of remote adenocarcinoma of the colon, cryptogenic cirrhosis, COPD, adult onset diabetes mellitus, seizure disorder, and prior history of C. difficile colitis. She was diagnosed with C. difficile colitis in August of 2012 and was treated with a prolonged course of Flagyl with resolution. Patient comes in today after acute onset of diarrhea on Friday, 04/08/2012. She said prior to that time she was having some problems with constipation which was unusual for her. On Saturday 78 she says she had at least 14 watery mucoid stools which were nonbloody were malodorous. Yesterday she says she did not count the number of bowel movements and today has had 9 bowel movements prior to this visit. She has been taking some Prevalite without much response. She relates urgency and incontinence has had low-grade temp to 99-100 range nausea but no vomiting. She describes crampy abdominal pain. She also mentions that she's had some increasing hoarseness recently as well as abdominal distention. She is able to eat a little bit and has been trying to a push fluids. She has had a couple of recent courses of antibiotics initially was given a course of Bactrim for urinary tract infection then Keflex for COPD exacerbation and is currently finishing a course of Biaxin.   Review of Systems  Constitutional: Positive for fever, appetite change and fatigue.  HENT: Positive for voice change.   Eyes: Negative.   Respiratory: Positive for cough.   Cardiovascular: Negative.   Gastrointestinal: Positive for nausea, abdominal pain and diarrhea.  Genitourinary: Negative.   Musculoskeletal: Negative.   Skin: Negative.   Neurological: Positive for weakness.  Hematological: Negative.   Psychiatric/Behavioral: Negative.     Outpatient Prescriptions Prior to Visit  Medication Sig Dispense Refill  . albuterol (PROVENTIL HFA;VENTOLIN HFA) 108 (90 BASE) MCG/ACT inhaler Inhale 1-2 puffs into the lungs every 6 (six) hours as needed for wheezing.  1 Inhaler  0  . budesonide-formoterol (SYMBICORT) 80-4.5 MCG/ACT inhaler Inhale 2 puffs into the lungs 2 (two) times daily. Rinse mouth  3 Inhaler  3  . Cholecalciferol (VITAMIN D3) 1000 UNITS tablet Take 1,000 Units by mouth daily.        . clarithromycin (BIAXIN) 250 MG tablet Take 1 tablet (250 mg total) by mouth 2 (two) times daily. Take with food  20 tablet  0  . clonazePAM (KLONOPIN) 0.5 MG tablet Take 0.5 mg by mouth. Take on tablet in the morning and up to 1 and  1/2  (1.5 MG total) at bedtime as neededFor sleep      . clopidogrel (PLAVIX) 75 MG tablet Take 1 tablet (75 mg total) by mouth daily.  90 tablet  3  . Cyanocobalamin (VITAMIN B-12) 500 MCG SUBL Place 1 tablet (500 mcg total) under the tongue 1 day or 1 dose.  100 tablet  3  . esomeprazole (NEXIUM) 40 MG capsule Take 40 mg by mouth 2 (two) times daily. Patient uses this medication for acid reflux.      Marland Kitchen glimepiride (AMARYL) 1 MG tablet Take 1 mg by mouth 2 (two) times daily.       Marland Kitchen glucose blood test strip Use as instructed  100 each  3  . nitroGLYCERIN (NITROSTAT) 0.4 MG SL tablet Place 1 tablet (0.4 mg total) under the tongue every 5 (five)  minutes as needed for chest pain (Up to 3 doses. If you have to take the 3rd dose, call 911.).  25 tablet  4  . phenytoin (DILANTIN) 100 MG ER capsule Take 100 mg by mouth daily. Take 100 mg every  Morning and 200 mg at bedtime      . potassium chloride (KLOR-CON) 10 MEQ CR tablet Take 20 mEq by mouth 2 (two) times daily.       Allergies  Allergen Reactions  . Bactrim (Sulfamethoxazole W-Trimethoprim) Shortness Of Breath  . Levofloxacin Other (See Comments)    seizure  . Pneumovax (Pneumococcal Polysaccharides) Other (See Comments)    Severe allergy-cellulitis  .  Amoxicillin-Pot Clavulanate Other (See Comments)    Patient stated Drs. recommend taking Augmentin due to her PCN allergy.  . Penicillins Rash    unknown  . Haldol (Haloperidol Lactate)     Had unknown reaction many yrs ago per pt  . Morphine And Related     Confused and agitation  . Sudafed (Pseudoephedrine Hcl) Other (See Comments)    seizure   Patient Active Problem List  Diagnosis  . WART, VIRAL  . ADENOCARCINOMA, COLON  . NEOPLASM OF UNCERTAIN BEHAVIOR OF SKIN  . DIABETES MELLITUS, TYPE II  . ANEMIA, IRON DEFICIENCY  . ANEMIA-B12 DEFICIENCY  . CONFUSION  . ANXIETY  . TOBACCO ABUSE  . INSOMNIA, CHRONIC  . DEPRESSION  . GRAND MAL SEIZURE  . ATHEROSCLEROSIS  . PERIPHERAL VASCULAR DISEASE  . UPPER RESPIRATORY INFECTION (URI)  . COPD  . GERD  . DIVERTICULOSIS-COLON  . Irritable bowel syndrome  . ANGIODYSPLASIA-INTESTINE  . CIRRHOSIS  . RENAL CYST, LEFT  . UTI  . BREAST PAIN  . UNSPECIFIED BREAST DISORDER  . ACTINIC KERATOSIS  . SHOULDER PAIN  . ELBOW PAIN  . WRIST PAIN  . NECK PAIN  . SEIZURE DISORDER  . SLEEP RELATED MOVEMENT DISORDER UNSPECIFIED  . FATIGUE  . BELCHING  . DYSURIA  . ABNORMAL FINDINGS GI TRACT  . ABNORMAL LIVER FUNCTION TESTS  . PERSONAL HX COLON CANCER  . SEIZURES, HX OF  . TRANSIENT ISCHEMIC ATTACK, HX OF  . PERSONAL HX COLONIC POLYPS  . COPD exacerbation  . Reaction to Pneumovax immunization  . Colitis  . Aortic stenosis, moderate  . Hyperlipidemia  . UTI (lower urinary tract infection)   History   Social History  . Marital Status: Widowed    Spouse Name: N/A    Number of Children: N/A  . Years of Education: N/A   Occupational History  . retired    Social History Main Topics  . Smoking status: Former Smoker -- 0.5 packs/day    Types: Cigarettes    Quit date: 03/29/2012  . Smokeless tobacco: Never Used   Comment: Using Electronic cigarette  . Alcohol Use: No  . Drug Use: No  . Sexually Active: No   Other Topics  Concern  . Not on file   Social History Narrative   Patient signed a Designated Party Release to allow her daughter, Monique Fleming, to have access to her medical records/information. Entered by Monique Shepherd March 24,2011 @ 2:47 pmDailly Caffeine Use       Objective:   Physical Exam  well-developed elderly white female in no acute distress, pleasant. Blood pressure 128/60, pulse 80 temp 98 2 weight 137. HEENT nontraumatic normocephalic EOMI PERRLA sclera anicteric conjunctiva pain, oropharynx somewhat dry, Supple no JVD, Cardiovascular regular rate and rhythm with S1-S2 no murmur gallop, Pulmonary decreased breath sounds  bilaterally but clear, Abdomen somewhat protuberant soft bowel sounds are hyperactive she is some mild generalized tenderness is no guarding no rebound no palpable mass or hepatosplenomegaly, Rectal exam not done, Extremities no clubbing cyanosis or edema skin warm dry, Psych; mood and affect normal and appropriate.        Assessment & Plan:  #53 78 year old female with acute diarrheal illness onset 3 days ago with profuse watery diarrhea abdominal cramping low-grade fever and incontinence in the setting of recent antibiotic use. Will need to rule out recurrent C. difficile colitis or says antibiotic-induced diarrhea. #2 COPD with recent exacerbation #3 history of seizure disorder #4 remote history of colon cancer #5 peripheral vascular disease #6 history of TIA  Plan; Will check CBC and be met today Patient encouraged to push oral fluids and very soft bland diet Check stool for C. difficile PCR and lactoferrin Start empiric Flagyl 250 mg by mouth 4 times daily x14 days Start florastor one by mouth by mouth twice a day x14 days We will need to check on her in the next 24-48 hours, she is at risk for dehydration and if she does not improve quickly may require hospitalization.

## 2012-04-11 NOTE — Patient Instructions (Addendum)
Please go to the basement level to have your labs drawn.   We sent prescriptions to [facility] in [facility].  We will call you on Wednesday to check on you.

## 2012-04-11 NOTE — Telephone Encounter (Signed)
Pt states she has been sick recently with UTI and bronchitis, she has taken antibiotics. Pt states she is now having lots of diarrhea, bloating, and terrible gas. Pt thinks she may have cdiff again. Pt scheduled to see Mike Gip PA today at 2:30pm. Pt aware of appt date and time.

## 2012-04-12 NOTE — Progress Notes (Signed)
Pt known to me. Agree with assessment and plans

## 2012-04-13 ENCOUNTER — Telehealth: Payer: Self-pay | Admitting: Internal Medicine

## 2012-04-13 NOTE — Telephone Encounter (Signed)
Pts daughter called and is concerned that her mother could not finish her antibiotics. Would like to have [place] check a UA/Culture tomorrow when she is seen by Mike Gip PA to see if everything has cleared up. Amy Esterwood PA notified.

## 2012-04-13 NOTE — Telephone Encounter (Signed)
She was on Biaxin for COPD , not Uti, I will discuss tomorrow

## 2012-04-14 ENCOUNTER — Other Ambulatory Visit (INDEPENDENT_AMBULATORY_CARE_PROVIDER_SITE_OTHER): Payer: Medicare Other

## 2012-04-14 ENCOUNTER — Encounter: Payer: Self-pay | Admitting: Physician Assistant

## 2012-04-14 ENCOUNTER — Ambulatory Visit (INDEPENDENT_AMBULATORY_CARE_PROVIDER_SITE_OTHER): Payer: Medicare Other | Admitting: Physician Assistant

## 2012-04-14 VITALS — BP 118/60 | HR 86 | Ht 65.0 in | Wt 140.0 lb

## 2012-04-14 DIAGNOSIS — R197 Diarrhea, unspecified: Secondary | ICD-10-CM

## 2012-04-14 DIAGNOSIS — K7460 Unspecified cirrhosis of liver: Secondary | ICD-10-CM

## 2012-04-14 DIAGNOSIS — K759 Inflammatory liver disease, unspecified: Secondary | ICD-10-CM

## 2012-04-14 DIAGNOSIS — K7200 Acute and subacute hepatic failure without coma: Secondary | ICD-10-CM

## 2012-04-14 DIAGNOSIS — N390 Urinary tract infection, site not specified: Secondary | ICD-10-CM

## 2012-04-14 DIAGNOSIS — A09 Infectious gastroenteritis and colitis, unspecified: Secondary | ICD-10-CM

## 2012-04-14 LAB — CBC WITH DIFFERENTIAL/PLATELET
Basophils Relative: 0.3 % (ref 0.0–3.0)
Hemoglobin: 11.2 g/dL — ABNORMAL LOW (ref 12.0–15.0)
Lymphocytes Relative: 17.3 % (ref 12.0–46.0)
Monocytes Relative: 12.2 % — ABNORMAL HIGH (ref 3.0–12.0)
Neutro Abs: 6.8 10*3/uL (ref 1.4–7.7)
RBC: 4.01 Mil/uL (ref 3.87–5.11)

## 2012-04-14 LAB — BASIC METABOLIC PANEL
BUN: 13 mg/dL (ref 6–23)
CO2: 22 mEq/L (ref 19–32)
Chloride: 109 mEq/L (ref 96–112)
Creatinine, Ser: 1.2 mg/dL (ref 0.4–1.2)

## 2012-04-14 NOTE — Patient Instructions (Addendum)
Please go to the basement level to have your labs drawn.   Continue the flagyl and Florasor. We will call you with the lab results.  Follow up with Dr. Yancey Flemings on 05-03-2012 at 3:15 PM.

## 2012-04-14 NOTE — Progress Notes (Addendum)
Subjective:    Patient ID: Monique Fleming, female    DOB: 03/16/1929, 78 y.o.   MRN: [id number]  HPI  Monique Fleming is very nice 78 year old white female known to Dr. [name]. She was seen in the office by myself on 04/11/2012 and at that time acutely ill with 3-4 day history of diarrhea Donald cramping low-grade fever and incontinence. She had taken 3 recent courses of antibiotics it was felt she may have C. difficile colitis. She does have prior history of C. difficile colitis in August of 2012. We started her on Flagyl 254 times daily and flora store, asked her to push fluids. Labs were done and showed an elevated WBC at 17.4 hemoglobin 12.3 hematocrit of 37.7 potassium 4.1 and creatinine of 1.3.  She is seen back in the office today as there was concern that she may be getting dehydrated and that if she did not improve quickly would require hospitalization. Today she is feeling better, has returned her stool sample, but has only had 3 bowel movements so far today which are still liquid. She has not had any further nocturnal episodes of diarrhea no fever or chills. She still has some abdominal discomfort but says this is better and has been able to eat. Her weight is actually up 3 pounds. She had been on a course of Biaxin and we stopped this. She was being treated for a COPD exacerbation. She currently denies any shortness of breath or sputum production she has had what she calls a little pleuritic pain in the right posterior area. Her daughter is also concerned because she has had problems with recurrent urinary tract infections and had just completed 2 antibiotics for the UTIs. She would she would like to make sure this has cleared.   Review of Systems  HENT: Negative.   Eyes: Negative.   Respiratory: Negative.   Cardiovascular: Negative.   Gastrointestinal: Positive for abdominal pain and diarrhea.  Genitourinary: Positive for dysuria.  Musculoskeletal: Negative.   Neurological: Positive for  weakness.  Hematological: Negative.   Psychiatric/Behavioral: Negative.    Outpatient Prescriptions Prior to Visit  Medication Sig Dispense Refill  . albuterol (PROVENTIL HFA;VENTOLIN HFA) 108 (90 BASE) MCG/ACT inhaler Inhale 1-2 puffs into the lungs every 6 (six) hours as needed for wheezing.  1 Inhaler  0  . budesonide-formoterol (SYMBICORT) 80-4.5 MCG/ACT inhaler Inhale 2 puffs into the lungs 2 (two) times daily. Rinse mouth  3 Inhaler  3  . Cholecalciferol (VITAMIN D3) 1000 UNITS tablet Take 1,000 Units by mouth daily.        . clarithromycin (BIAXIN) 250 MG tablet Take 1 tablet (250 mg total) by mouth 2 (two) times daily. Take with food  20 tablet  0  . clonazePAM (KLONOPIN) 0.5 MG tablet Take 0.5 mg by mouth. Take on tablet in the morning and up to 1 and  1/2  (1.5 MG total) at bedtime as neededFor sleep      . clopidogrel (PLAVIX) 75 MG tablet Take 1 tablet (75 mg total) by mouth daily.  90 tablet  3  . Cyanocobalamin (VITAMIN B-12) 500 MCG SUBL Place 1 tablet (500 mcg total) under the tongue 1 day or 1 dose.  100 tablet  3  . esomeprazole (NEXIUM) 40 MG capsule Take 40 mg by mouth 2 (two) times daily. Patient uses this medication for acid reflux.      Marland Kitchen glimepiride (AMARYL) 1 MG tablet Take 1 mg by mouth 2 (two) times daily.       Marland Kitchen  glucose blood test strip Use as instructed  100 each  3  . metroNIDAZOLE (FLAGYL) 250 MG tablet Take 1 tab 4 times daily.  56 tablet  0  . nitroGLYCERIN (NITROSTAT) 0.4 MG SL tablet Place 1 tablet (0.4 mg total) under the tongue every 5 (five) minutes as needed for chest pain (Up to 3 doses. If you have to take the 3rd dose, call 911.).  25 tablet  4  . phenytoin (DILANTIN) 100 MG ER capsule Take 100 mg by mouth daily. Take 100 mg every  Morning and 200 mg at bedtime      . potassium chloride (KLOR-CON) 10 MEQ CR tablet Take 20 mEq by mouth 2 (two) times daily.      Marland Kitchen saccharomyces boulardii (FLORASTOR) 250 MG capsule Take 1 capsule (250 mg total) by mouth 2  (two) times daily.  28 capsule  0   Allergies  Allergen Reactions  . Bactrim (Sulfamethoxazole W-Trimethoprim) Shortness Of Breath  . Levofloxacin Other (See Comments)    seizure  . Pneumovax (Pneumococcal Polysaccharides) Other (See Comments)    Severe allergy-cellulitis  . Amoxicillin-Pot Clavulanate Other (See Comments)    Patient stated Drs. recommend taking Augmentin due to her PCN allergy.  . Penicillins Rash    unknown  . Haldol (Haloperidol Lactate)     Had unknown reaction many yrs ago per pt  . Morphine And Related     Confused and agitation  . Sudafed (Pseudoephedrine Hcl) Other (See Comments)    seizure   Past family and social history all reviewed and no change from last office visit 2 days ago    Objective:   Physical Exam well-developed elderly white female in no acute distress, looks better, accompanied by her daughter blood pressure 118/60 pulse 86 height 5 foot 5 weight 140 up 3 pounds. HEENT; nontraumatic normocephalic EOMI PERRLA, Supple no JVD, Cardiovascular regular rate and rhythm with S1-S2 soft murmur, Pulmonary; clear bilaterally,, Abdomen; soft minimally tender bilateral lower quadrants no guarding no rebound no palpable mass or hepatosplenomegaly bowel sounds are present,, Rectal; not done, Extremities; no clubbing cyanosis or edema skin warm and dry, Psych; mood and affect normal and appropriate        Assessment & Plan:  #11 78 year old female with probable C. difficile colitis, improving with good response to metronidazole and florastor #2 prior history of C. difficile colitis August 2012 #3 double recurrent urinary tract infections #4 hepatic cirrhosis #5 history of colon cancer #6 history of TIA and peripheral vascular disease, on Plavix  Plan; continue current regimen with 14 day course of Flagyl 250 4 times daily and florastor   one by mouth twice a day.. Repeat CBC and bmet today Check urine culture to assure clearance of recent Escherichia  coli UTI Bland diet and continue pushing fluids Followup with Dr. [name] in the office in 2-3 weeks,, patient knows she should call should she have any deterioration in her symptoms at any point in the interim  Addendum: Reviewed and agree with initial management. Beverley Fiedler, MD

## 2012-04-18 LAB — CLOSTRIDIUM DIFFICILE BY PCR: Toxigenic C. Difficile by PCR: NOT DETECTED

## 2012-04-18 LAB — URINE CULTURE

## 2012-04-19 ENCOUNTER — Other Ambulatory Visit: Payer: Self-pay

## 2012-04-19 MED ORDER — NITROFURANTOIN MONOHYD MACRO 100 MG PO CAPS: 100.0000 mg | ORAL_CAPSULE | Freq: Two times a day (BID) | ORAL | Status: DC

## 2012-04-22 ENCOUNTER — Telehealth: Payer: Self-pay | Admitting: Internal Medicine

## 2012-04-22 NOTE — Telephone Encounter (Signed)
Patient reports upper abdominal pain.  She reports her diarrhea has improved, but she still has a few episodes a day.  She does not feel she can wait until 05/03/12 to see Dr. [name].  She will come in on Monday and see Mike Gip PA

## 2012-04-25 ENCOUNTER — Telehealth: Payer: Self-pay | Admitting: Physician Assistant

## 2012-04-25 ENCOUNTER — Encounter: Payer: Self-pay | Admitting: Physician Assistant

## 2012-04-25 ENCOUNTER — Other Ambulatory Visit (INDEPENDENT_AMBULATORY_CARE_PROVIDER_SITE_OTHER): Payer: Medicare Other

## 2012-04-25 ENCOUNTER — Ambulatory Visit (INDEPENDENT_AMBULATORY_CARE_PROVIDER_SITE_OTHER): Payer: Medicare Other | Admitting: Physician Assistant

## 2012-04-25 VITALS — BP 140/60 | HR 80 | Ht 65.6 in | Wt 136.4 lb

## 2012-04-25 DIAGNOSIS — N390 Urinary tract infection, site not specified: Secondary | ICD-10-CM

## 2012-04-25 DIAGNOSIS — R110 Nausea: Secondary | ICD-10-CM

## 2012-04-25 DIAGNOSIS — R1013 Epigastric pain: Secondary | ICD-10-CM

## 2012-04-25 LAB — COMPREHENSIVE METABOLIC PANEL
ALT: 18 U/L (ref 0–35)
Albumin: 3.4 g/dL — ABNORMAL LOW (ref 3.5–5.2)
CO2: 27 mEq/L (ref 19–32)
Calcium: 9 mg/dL (ref 8.4–10.5)
Chloride: 102 mEq/L (ref 96–112)
Creatinine, Ser: 0.8 mg/dL (ref 0.4–1.2)
GFR: 71.17 mL/min (ref >60.00)
Potassium: 3.8 mEq/L (ref 3.5–5.1)
Total Protein: 7.7 g/dL (ref 6.0–8.3)

## 2012-04-25 LAB — CBC WITH DIFFERENTIAL/PLATELET
Basophils Absolute: 0.1 10*3/uL (ref 0.0–0.1)
Eosinophils Absolute: 0.2 10*3/uL (ref 0.0–0.7)
Hemoglobin: 11.9 g/dL — ABNORMAL LOW (ref 12.0–15.0)
Lymphocytes Relative: 19.4 % (ref 12.0–46.0)
MCHC: 33.3 g/dL (ref 30.0–36.0)
Monocytes Relative: 10.8 % (ref 3.0–12.0)
Neutrophils Relative %: 66.3 % (ref 43.0–77.0)
Platelets: 323 10*3/uL (ref 150.0–400.0)
RDW: 14.5 % (ref 11.5–14.6)

## 2012-04-25 LAB — LIPASE: Lipase: 71 U/L — ABNORMAL HIGH (ref 11.0–59.0)

## 2012-04-25 MED ORDER — PROMETHAZINE HCL 12.5 MG PO TABS: 12.5000 mg | ORAL_TABLET | Freq: Four times a day (QID) | ORAL | Status: DC | PRN

## 2012-04-25 MED ORDER — SUCRALFATE 1 GM/10ML PO SUSP: ORAL | Status: DC

## 2012-04-25 NOTE — Progress Notes (Signed)
Subjective:    Patient ID: Monique Fleming, female    DOB: 10-Jul-1929, 78 y.o.   MRN: [id number]  HPI Monique Fleming is a pleasant 78 year old white female known to Dr. [name]. She has history of cryptogenic cirrhosis and has been compensated, remote history of adenocarcinoma of the colon, COPD, adult onset diabetes mellitus, seizure disorder, and history of C. difficile colitis in August of 2012. She has been seen here in the office twice over the past month.. She was initially seen July 8 with complaints of recurrent profuse diarrhea. She had been on antibiotics prior to that time for a bladder infection and also for COPD exacerbation. It was felt likely she had recurrent C. difficile and she was empirically started on Flagyl and floor store. Her stool for C. difficile PCR was done on 04/14/2012 was negative. She was seen back in the office following week and feeling better at that time from the standpoint of the diarrhea. Her daughter was concerned because she was having some recurrent dysuria and has had problems with recurrent urinary tract infections. We recultured her urine at that time which did come back positive for Escherichia coli, this was not pansensitive. She was given a course of Macrobid x7 days.: Currently finishing her course of Flagyl and florastor. Patient comes back in today stating that she's been feeling very sick to her stomach over the past 4 days. She complains of epigastric discomfort gas belching burping and reflux. She has not been vomiting. She denies any fever or chills. She says her diarrhea is better but she still having 3-4 somewhat loose stools per day. She says her appetite is very poor and she's not been able to eat much and feels weak. She says she has started back on the Questran he was taking previously. She has no current complaints of dysuria. Patient did have an upper endoscopy with Dr. [name] in February of 2013-no varices, she does have mild portal gastropathy. Last  colonoscopy was done in April 2011 showing previous right hemicolectomy she had several large nonbleeding AVMs noted in the right colon and 4 polyps were removed with past showing tubular adenomas. She takes Nexium 40 mg by mouth twice daily chronically   Review of Systems  Constitutional: Positive for activity change, appetite change, fatigue and unexpected weight change.  HENT: Negative.   Eyes: Negative.   Respiratory: Negative.   Cardiovascular: Negative.   Gastrointestinal: Positive for nausea and abdominal pain.  Genitourinary: Negative.   Musculoskeletal: Negative.   Skin: Negative.   Neurological: Negative.   Hematological: Negative.   Psychiatric/Behavioral: Negative.    Allergies  Allergen Reactions  . Bactrim (Sulfamethoxazole W-Trimethoprim) Shortness Of Breath  . Levofloxacin Other (See Comments)    seizure  . Pneumovax (Pneumococcal Polysaccharides) Other (See Comments)    Severe allergy-cellulitis  . Amoxicillin-Pot Clavulanate Other (See Comments)    Patient stated Drs. recommend taking Augmentin due to her PCN allergy.  . Penicillins Rash    unknown  . Haldol (Haloperidol Lactate)     Had unknown reaction many yrs ago per pt  . Morphine And Related     Confused and agitation  . Sudafed (Pseudoephedrine Hcl) Other (See Comments)    seizure   Outpatient Prescriptions Prior to Visit  Medication Sig Dispense Refill  . albuterol (PROVENTIL HFA;VENTOLIN HFA) 108 (90 BASE) MCG/ACT inhaler Inhale 1-2 puffs into the lungs every 6 (six) hours as needed for wheezing.  1 Inhaler  0  . budesonide-formoterol (SYMBICORT) 80-4.5 MCG/ACT inhaler Inhale  2 puffs into the lungs 2 (two) times daily. Rinse mouth  3 Inhaler  3  . Cholecalciferol (VITAMIN D3) 1000 UNITS tablet Take 1,000 Units by mouth daily.        . clonazePAM (KLONOPIN) 0.5 MG tablet Take 0.5 mg by mouth. Take on tablet in the morning and up to 1 and  1/2  (1.5 MG total) at bedtime as neededFor sleep      .  clopidogrel (PLAVIX) 75 MG tablet Take 1 tablet (75 mg total) by mouth daily.  90 tablet  3  . Cyanocobalamin (VITAMIN B-12) 500 MCG SUBL Place 1 tablet (500 mcg total) under the tongue 1 day or 1 dose.  100 tablet  3  . esomeprazole (NEXIUM) 40 MG capsule Take 40 mg by mouth 2 (two) times daily. Patient uses this medication for acid reflux.      Marland Kitchen glimepiride (AMARYL) 1 MG tablet Take 1 mg by mouth 2 (two) times daily.       Marland Kitchen glucose blood test strip Use as instructed  100 each  3  . metroNIDAZOLE (FLAGYL) 250 MG tablet Take 1 tab 4 times daily.  56 tablet  0  . nitrofurantoin, macrocrystal-monohydrate, (MACROBID) 100 MG capsule Take 1 capsule (100 mg total) by mouth 2 (two) times daily.  14 capsule  0  . nitroGLYCERIN (NITROSTAT) 0.4 MG SL tablet Place 1 tablet (0.4 mg total) under the tongue every 5 (five) minutes as needed for chest pain (Up to 3 doses. If you have to take the 3rd dose, call 911.).  25 tablet  4  . phenytoin (DILANTIN) 100 MG ER capsule Take 100 mg by mouth daily. Take 100 mg every  Morning and 200 mg at bedtime      . potassium chloride (KLOR-CON) 10 MEQ CR tablet Take 20 mEq by mouth 2 (two) times daily.      Marland Kitchen saccharomyces boulardii (FLORASTOR) 250 MG capsule Take 1 capsule (250 mg total) by mouth 2 (two) times daily.  28 capsule  0       Patient Active Problem List  Diagnosis  . WART, VIRAL  . NEOPLASM OF UNCERTAIN BEHAVIOR OF SKIN  . DIABETES MELLITUS, TYPE II  . ANEMIA, IRON DEFICIENCY  . ANEMIA-B12 DEFICIENCY  . CONFUSION  . ANXIETY  . TOBACCO ABUSE  . INSOMNIA, CHRONIC  . DEPRESSION  . GRAND MAL SEIZURE  . ATHEROSCLEROSIS  . PERIPHERAL VASCULAR DISEASE  . UPPER RESPIRATORY INFECTION (URI)  . COPD  . GERD  . DIVERTICULOSIS-COLON  . Irritable bowel syndrome  . ANGIODYSPLASIA-INTESTINE  . CIRRHOSIS  . RENAL CYST, LEFT  . UTI  . BREAST PAIN  . UNSPECIFIED BREAST DISORDER  . ACTINIC KERATOSIS  . SHOULDER PAIN  . ELBOW PAIN  . WRIST PAIN  . NECK  PAIN  . SEIZURE DISORDER  . SLEEP RELATED MOVEMENT DISORDER UNSPECIFIED  . FATIGUE  . BELCHING  . DYSURIA  . PERSONAL HX COLON CANCER  . SEIZURES, HX OF  . TRANSIENT ISCHEMIC ATTACK, HX OF  . PERSONAL HX COLONIC POLYPS  . COPD exacerbation  . Reaction to Pneumovax immunization  . C. difficile colitis  . Aortic stenosis, moderate  . Hyperlipidemia  . UTI (lower urinary tract infection)    Objective:   Physical Exam  well-developed elderly white female in no acute distress. Blood pressure 140/60, pulse 80 height 5 foot 5 weight 136. HEENT ;nontraumatic normocephalic EOMI PERRLA sclera anicteric conjunctiva pink, Oropharynx moist,;Neck; Supple no JVD, Cardiovascular; regular  rate and rhythm with S1-S2 no murmur or gallop, Pulmonary; clear bilaterally, Abdomen; soft she is mildly tender in the epigastrium there is no palpable mass or hepatosplenomegaly bowel sounds are active, Rectal; exam not done, Extremities;no clubbing cyanosis or edema skin warm and dry, Psych; mood and affect normal and appropriate.        Assessment & Plan:  #46 78 year old female with history of C. difficile colitis August 2012, seen 3 weeks ago with recurrent diarrhea after taking 2 courses of antibiotics and empirically treated for possible recurrent C. difficile with Flagyl. C. difficile PCR is negative, and diarrhea is improving. #2 recurrent urinary tract infections-with recent urine cultures x2 positive for Escherichia coli; currently completing a course of Macrobid #3  4-5 day history of epigastric discomfort nausea gas and reflux-suspect this is secondary to side effects from metronidazole and Macrobid. #4 cryptogenic cirrhosis, compensated #5 status post cholecystectomy #6 seizure disorder #7 remote history of colon cancer status post right hemicolectomy, up-to-date with: Screening #8 COPD  Plan; stop Flagyl, patient has had at least 8 days worth Stop Macrobid after today's dose Check CBC ,CMET,  lipase and Dilantin level today, follow up urine culture Patient has a prescription for Zofran which she says does not help her nausea, will stop Zofran and start Phenergan 12.5 mg every 6 hours when necessary nausea Continue Nexium 40 mg by mouth twice daily Patient encouraged to push oral fluids Add Carafate slurry 1 g 3 times daily between meals for 10 days Patient has an appointment with Dr. [name] on July 30 and she will keep this

## 2012-04-25 NOTE — Patient Instructions (Addendum)
Please go to the basement level to have your labs drawn.  We sent a prescription for Phenergan to [facility].  We also sent one for Carafate liquid .  Stay on the Nexium twice daily. Stop Zofran.

## 2012-04-26 LAB — PHENYTOIN LEVEL, TOTAL: Phenytoin Lvl: 3.1 ug/mL — ABNORMAL LOW (ref 10.0–20.0)

## 2012-04-26 NOTE — Progress Notes (Signed)
i agree with plan for likely antibiotic related GI isses

## 2012-04-27 LAB — URINE CULTURE

## 2012-04-28 ENCOUNTER — Telehealth: Payer: Self-pay | Admitting: *Deleted

## 2012-04-28 NOTE — Telephone Encounter (Signed)
Left a message for patient to call me. 

## 2012-04-29 NOTE — Telephone Encounter (Signed)
Doing better. She is taking her nausea medication every day. States she is seeing Dr. Maple Hudson on Tuesday.

## 2012-05-03 ENCOUNTER — Ambulatory Visit (INDEPENDENT_AMBULATORY_CARE_PROVIDER_SITE_OTHER): Payer: Medicare Other | Admitting: Internal Medicine

## 2012-05-03 ENCOUNTER — Encounter: Payer: Self-pay | Admitting: Internal Medicine

## 2012-05-03 VITALS — BP 124/70 | HR 68 | Ht 65.0 in | Wt 137.4 lb

## 2012-05-03 DIAGNOSIS — K219 Gastro-esophageal reflux disease without esophagitis: Secondary | ICD-10-CM

## 2012-05-03 DIAGNOSIS — R110 Nausea: Secondary | ICD-10-CM

## 2012-05-03 DIAGNOSIS — R197 Diarrhea, unspecified: Secondary | ICD-10-CM

## 2012-05-03 DIAGNOSIS — R143 Flatulence: Secondary | ICD-10-CM

## 2012-05-03 MED ORDER — ONDANSETRON HCL 4 MG PO TABS: 4.0000 mg | ORAL_TABLET | ORAL | Status: AC | PRN

## 2012-05-03 NOTE — Patient Instructions (Addendum)
You have been scheduled for a gastric emptying scan at [facility] on 05-06-12 at 8:00am. Please arrive at least 15 minutes prior to your appointment for registration. Please make certain not to have anything to eat or drink after midnight prior to your test. Hold your Nexium for 24 hours prior to your test. If you need to reschedule your appointment, please contact radiology scheduling at [phone].  We have sent the following medications to your pharmacy for you to pick up at your convenience:  Zofran

## 2012-05-03 NOTE — Progress Notes (Signed)
HISTORY OF PRESENT ILLNESS:  Monique Fleming is a 78 y.o. female with multiple medical problems as listed below. She has been followed in this office for a history of cryptogenic cirrhosis (compensated), colon cancer with prior resection, GERD, and chronic diarrhea. Also has had a history of Clostridium difficile associated diarrhea. She was seen one week ago by the physician assistant regarding diarrhea. Laboratories and stool studies reviewed. Unremarkable.. No evidence for recurrent C. Difficile. This appointment had been made previously. She is accompanied today by her daughter. Her diarrhea is better. Back to its baseline. Chief complaint today is that of nausea x2 months. Associated with this is a decreased appetite and bloating. Mild weight loss. Some regurgitation. She cannot identify any relieving factors other than Phenergan (see below). Symptoms possibly exacerbated by meals and stress. She is concerned that she has a serious problem. She is worried about stomach ulcers. Surveillance endoscopy in February 2013 was essentially unremarkable except for mild portal gastropathy. She stays on twice a day Nexium. She has been using Phenergan for nausea. This helps, but makes her sleepy. No new medications in the past several months. She states her diabetes has been under good control. She has chronic anxiety.  REVIEW OF SYSTEMS:  All non-GI ROS negative except for anxiety, vision change, cough, depression, fatigue, hearing problems, heart murmur, muscle cramps, shortness of breath, insomnia, excessive thirst  Past Medical History  Diagnosis Date  . Aortic stenosis     moderate by echo 6/11 gradient 20/61mmHg for mean and peak  . TIA (transient ischemic attack)     Dr. Pearlean Brownie  . Peripheral vascular disease   . IBS (irritable bowel syndrome)   . Diabetes mellitus     type II  . Chronic bronchitis   . COPD (chronic obstructive pulmonary disease)   . Atherosclerosis   . Depression   . Anxiety     Dr Donell Beers  . Renal cyst   . Anemia     iron deficiency  . Cellulitis   . Diverticular disease   . Infectious diarrhea   . Sinusitis   . Thrush   . Abnormal liver function test   . GERD (gastroesophageal reflux disease)   . Insomnia     chronic  . Adenocarcinoma 2008    colon,s/p right hemicolectomy  . Angina   . Seizures     hx of one seizure "  . Cirrhosis of liver     hx of    Past Surgical History  Procedure Date  . Carotid endarterectomy   . Colectomy     right hemi  . Cholecystectomy 11/09    Rt Breast  . Upper gastrointestinal endoscopy     Social History VIVIEN BARRETTO  reports that she quit smoking about 5 weeks ago. Her smoking use included Cigarettes. She smoked .5 packs per day. She has never used smokeless tobacco. She reports that she does not drink alcohol or use illicit drugs.  family history includes Colon cancer in her paternal grandmother; Diabetes in her brother; Esophageal cancer in her brother; Heart disease in her mother; and Hypertension in her father.  Allergies  Allergen Reactions  . Bactrim (Sulfamethoxazole W-Trimethoprim) Shortness Of Breath  . Levofloxacin Other (See Comments)    seizure  . Pneumovax (Pneumococcal Polysaccharides) Other (See Comments)    Severe allergy-cellulitis  . Amoxicillin-Pot Clavulanate Other (See Comments)    Patient stated Drs. recommend taking Augmentin due to her PCN allergy.  . Penicillins Rash  unknown  . Haldol (Haloperidol Lactate)     Had unknown reaction many yrs ago per pt  . Morphine And Related     Confused and agitation  . Sudafed (Pseudoephedrine Hcl) Other (See Comments)    seizure       PHYSICAL EXAMINATION:  Vital signs: BP 124/70  Pulse 68  Ht 5\' 5"  (1.651 m)  Wt 137 lb 6.4 oz (62.324 kg)  BMI 22.86 kg/m2  SpO2 97% General: Well-developed, well-nourished, no acute distress. Elderly and anxious HEENT: Sclerae are anicteric, conjunctiva pink. Oral mucosa intact. No  thrush Lungs: Clear Heart: Regular Abdomen: soft, nontender, nondistended, no obvious ascites, no peritoneal signs, normal bowel sounds. No organomegaly. No succussion splash. Extremities: No edema Psychiatric: alert and oriented x3. Cooperative     ASSESSMENT:  #1. Nausea and bloating. Nonspecific. May be related to medications, chronic medical problems, or anxiety. Rule out gastroparesis #2. GERD. On twice a day PPI #3. Cryptogenic cirrhosis #4. Chronic diarrhea. Mostly functional. Does have a history of Clostridium difficile related diarrhea. Negative for this recently #5. Multiple medical problems.   PLAN:  #1. Solid-phase gastric emptying scan to rule out gastroparesis #2. Prescribe Zofran 4 mg by mouth every 4 hours when necessary #3. Continue PPI and reflux precautions #4. Ongoing general medical care with PCP

## 2012-05-06 ENCOUNTER — Encounter (HOSPITAL_COMMUNITY)
Admission: RE | Admit: 2012-05-06 | Discharge: 2012-05-06 | Disposition: A | Discharge status: Home / Self Care | Payer: Medicare Other | Source: Ambulatory Visit | Attending: Internal Medicine | Admitting: Internal Medicine

## 2012-05-06 DIAGNOSIS — E119 Type 2 diabetes mellitus without complications: Secondary | ICD-10-CM | POA: Insufficient documentation

## 2012-05-06 DIAGNOSIS — R110 Nausea: Secondary | ICD-10-CM

## 2012-05-06 DIAGNOSIS — K219 Gastro-esophageal reflux disease without esophagitis: Secondary | ICD-10-CM

## 2012-05-06 DIAGNOSIS — R112 Nausea with vomiting, unspecified: Secondary | ICD-10-CM | POA: Insufficient documentation

## 2012-05-06 DIAGNOSIS — R141 Gas pain: Secondary | ICD-10-CM

## 2012-05-06 MED ORDER — TECHNETIUM TC 99M SULFUR COLLOID
2.2000 | Freq: Once | INTRAVENOUS | Status: AC | PRN
  Administered 2012-05-06: 2.2 via INTRAVENOUS

## 2012-05-16 ENCOUNTER — Ambulatory Visit (INDEPENDENT_AMBULATORY_CARE_PROVIDER_SITE_OTHER): Payer: Medicare Other | Admitting: Internal Medicine

## 2012-05-16 ENCOUNTER — Encounter: Payer: Self-pay | Admitting: Internal Medicine

## 2012-05-16 VITALS — BP 122/68 | HR 100 | Ht 65.25 in | Wt 136.0 lb

## 2012-05-16 DIAGNOSIS — F17200 Nicotine dependence, unspecified, uncomplicated: Secondary | ICD-10-CM

## 2012-05-16 DIAGNOSIS — J449 Chronic obstructive pulmonary disease, unspecified: Secondary | ICD-10-CM

## 2012-05-16 NOTE — Patient Instructions (Addendum)
Continue present meds  Please call as needed 

## 2012-05-16 NOTE — Progress Notes (Signed)
Patient ID: Monique Fleming, female    DOB: 1929/05/06, 78 y.o.   MRN: [id number]  HPI 46 yoF former smoker with COPD and multiple medical problems.  Last here November 05, 2010. Since then she reports staying off cigarettes and having no significant respiratory events. Pollen bothered more in March with nasal congestion, postnasal drip, but not much wheeze or cough. She feels current meds are adequate.  04/07/11- 81 yoF current smoker with COPD complicated by DM, ASCVD, hx colon cancer, anemia., GERD, esophageal varices. Was hosp for red, inflamed arm after Pneumovax with concern then that she had cellulits. Has had UTI then yeast from antibiotics- to see her GYN Has indigestion with known liver disease and esophageal varices.  Doesn't describe active respiratory complaints- mild occasional cough, dyspnea with exertion unchanged. We talked again about smoking and tried to motivate to full cessation. Smokes a few daily.  06/09/11- 63 yoF current smoker with COPD complicated by DM, ASCVD, hx colon cancer, anemia., GERD, esophageal varices. Reports doing well. Discussed flu shot after her sore arm from the pneumovax, and decided they were different enough that there should not be a similar reaction noted with flu vax.  She has not had significant recent cough, wheeze, phlegm, chest pain or palpitation.   08/11/11-  57 yoF current smoker with COPD complicated by DM, ASCVD, hx colon cancer, anemia., GERD, esophageal varices. She described her problems with bladder infection and C. difficile colitis requiring antibiotics almost continuously for the last 5 months. Her breathing has not changed with weather and season. She gets some raspy cough occasionally but rarely produces sputum. She denies chest pain or palpitation. She is still smoking an occasional cigarette I believe, but much less than before.  10/13/11- 18 yoF current smoker with COPD complicated by DM, ASCVD, hx colon cancer, anemia., GERD,  esophageal varices. Had a viral bronchitis syndrome, largely resolved now.  12/11/11- 82 yoF current smoker with COPD complicated by DM, ASCVD, hx colon cancer, anemia., GERD, esophageal varices Pharmacist suggested she blunt stimulation from  xopenex neb solution by diluting with a little water. She isn't sure the neb makes that much difference. Asks refill Symbicort. Reviewed her complex medical problem list. Not having acute respiratory symptoms now.  02/09/12- 65 yoF former smoker with COPD complicated by DM, ASCVD, hx colon cancer, anemia., GERD, esophageal varices  Pt states breathing is the same, no better or worse since last visit  She assures me that she is not smoking. Breathing feels comfortable with little routine cough or wheeze. Uses Symbicort twice daily. She has not been using her nebulizer machine. CXR 08/27/11 reviewed w/ her IMPRESSION:  Emphysematous changes and mild left lower lobe opacity, likely  scarring or atelectasis. No acute cardiopulmonary process  identified.  Original Report Authenticated By: Waneta Martins, M.D.   03/31/12- ER visit [facility]: HPI  Patient is an 78 year old female with a history of multiple medical problems including COPD and continued smoking who presents today complaining of shortness of breath. Patient reports that this began last night. 2 days ago she was seen by her primary care provider and diagnosed with a urinary tract infection. She was started on Bactrim for this. Patient feels that her symptoms began after taking this medication at home last night. Initially the patient is noted to be wheezing she reports that she always wheezes. Patient has numerous drug allergies and feels that her symptoms are likely a drug reaction. Patient had fingerstick of 177 upon  arrival. She was afebrile and hemodynamically stable. She denied any chest pain and has no history of coronary artery disease despite all of her other medical problems. She denies  fevers but does endorse some cough. Patient has no history of DVT or PE. She denies history of heart failure. Patient is not currently on any anticoagulation for any reason. She continues to smoke 7-8 cigarettes a day. Exertion appears to make her shortness of breath worse and nothing makes it better. There are no other associated or modifying factors.    04/05/12- 6 yoF former smoker with COPD complicated by DM, ASCVD, hx colon cancer, anemia., GERD, esophageal varices At ER - see note above: COPD exacerb, UTI. Small LUL nodules seemed larger>CT.   Daughter [name]  here Unfortunately she has admitted still smoking a few cigarettes daily against all our counseling.  COPD assessment test (CAT) 23/40 Emergency room visit June 27 for COPD exacerbation and UTI. She understood chest x-ray showed "nodules". When she called about it we ordered CT. She still feels tight on a prednisone taper ending today. Was on sulfa for UTI- changed to keflex.  CT chest 04/05/12- reviewed images and report with them IMPRESSION:  1. No acute findings.  2. Emphysema.  3. No pathologic adenopathy within the mediastinum or hilar  regions.  4. Prominent coronary artery calcifications  5. Cirrhosis.  Original Report Authenticated By: Rosealee Albee, M.D.   05/16/12-82 yoF former smoker with COPD complicated by DM, ASCVD, hx colon cancer, anemia., GERD, esophageal varices Denies any SOB, wheezing, cough, or congestion at this time. COPD assessment test (CAT) 15/40 Definitely feels better than last vis  Review of Systems-See HPI Constitutional:   No weight loss, night sweats,  Fevers, chills,  fatigue, lassitude. HEENT:   No headaches,  Difficulty swallowing,  Tooth/dental problems,  Sore throat,                No sneezing, itching, ear ache, nasal congestion, post nasal drip,  CV:  No chest pain, orthopnea, PND, swelling in lower extremities, anasarca, dizziness, palpitations GI  No heartburn, indigestion, abdominal  pain, nausea, vomiting,  Resp: + shortness of breath with routine exertion or at rest.  No excess mucus,   No coughing up of blood.  No change in color of mucus.  Skin: no rash or lesions. GU: MS:  No joint pain or swelling.  Marland Kitchen Psych:  No change in mood or affect. + depression or anxiety.  No memory loss.  Objective:   Physical Exam General- Alert, Oriented, Affect-appropriate, Distress- none acute; calm and talkative today Skin- rash-none, lesions- none, excoriation- none. Dry.  Lymphadenopathy- none Head- atraumatic            Eyes- Gross vision intact, PERRLA, conjunctivae clear secretions            Ears- Hearing-diminished            Nose- Clear, no-Septal dev, mucus, polyps, erosion, perforation             Throat- Mallampati II , mucosa clear , drainage- none, tonsils- atrophic. Mild thrush Neck- flexible , trachea midline, no stridor , thyroid nl, carotid no bruit Chest - symmetrical excursion , unlabored           Heart/CV- RRR , 1-2/6 systolic precordial murmur , no gallop  , no rub, nl s1 s2                           -  JVD- none , edema- none, stasis changes- none, varices- none           Lung-  distant, wheeze- none, + light cough , dullness-none, rub- none. Breathless speech pattern seems chronic.           Chest wall-  Abd- Br/ Gen/ Rectal- Not done, not indicated Extrem- cyanosis- none, clubbing, none, atrophy- none, strength- nl Neuro- grossly intact to observation

## 2012-05-19 NOTE — Telephone Encounter (Signed)
See Previous encounter

## 2012-05-22 NOTE — Assessment & Plan Note (Addendum)
More nearly at her baseline today without acute complaints. She says she stopped smoking her occasional cigarette, at the end of June. She is strongly encouraged to stay off.

## 2012-05-22 NOTE — Assessment & Plan Note (Addendum)
I have explained that even an occasional cigarette is not a good idea given her lung and coronary artery disease.

## 2012-05-24 ENCOUNTER — Ambulatory Visit: Payer: Medicare Other | Admitting: Internal Medicine

## 2012-05-26 ENCOUNTER — Ambulatory Visit (INDEPENDENT_AMBULATORY_CARE_PROVIDER_SITE_OTHER): Payer: Medicare Other | Admitting: Internal Medicine

## 2012-05-26 ENCOUNTER — Encounter: Payer: Self-pay | Admitting: Internal Medicine

## 2012-05-26 VITALS — BP 118/58 | HR 81 | Temp 98.4°F | Resp 20 | Wt 133.8 lb

## 2012-05-26 DIAGNOSIS — G40309 Generalized idiopathic epilepsy and epileptic syndromes, not intractable, without status epilepticus: Secondary | ICD-10-CM

## 2012-05-26 DIAGNOSIS — Z79899 Other long term (current) drug therapy: Secondary | ICD-10-CM

## 2012-05-26 DIAGNOSIS — E119 Type 2 diabetes mellitus without complications: Secondary | ICD-10-CM

## 2012-05-26 NOTE — Progress Notes (Signed)
Subjective:    Patient ID: Monique Fleming, female    DOB: [birth year], 78 y.o.   MRN: [id number]  HPI Pt presents to clinic for followup of multiple medical problems. Notes intermittent dark/black stools without blood or abdominal pain. fsbs recently 122-147. Reviewed outside labs with dilantin level 3.1. States not sz x 5 years. States is taking medication daily.   Past Medical History  Diagnosis Date  . Aortic stenosis     moderate by echo 6/11 gradient 20/56mmHg for mean and peak  . TIA (transient ischemic attack)     Dr. Pearlean Brownie  . Peripheral vascular disease   . IBS (irritable bowel syndrome)   . Diabetes mellitus     type II  . Chronic bronchitis   . COPD (chronic obstructive pulmonary disease)   . Atherosclerosis   . Depression   . Anxiety     Dr Donell Beers  . Renal cyst   . Anemia     iron deficiency  . Cellulitis   . Diverticular disease   . Infectious diarrhea   . Sinusitis   . Thrush   . Abnormal liver function test   . GERD (gastroesophageal reflux disease)   . Insomnia     chronic  . Adenocarcinoma 2008    colon,s/p right hemicolectomy  . Angina   . Seizures     hx of one seizure "  . Cirrhosis of liver     hx of   Past Surgical History  Procedure Date  . Carotid endarterectomy   . Colectomy     right hemi  . Cholecystectomy 11/09    Rt Breast  . Upper gastrointestinal endoscopy     reports that she quit smoking about 8 weeks ago. Her smoking use included Cigarettes. She smoked .5 packs per day. She has never used smokeless tobacco. She reports that she does not drink alcohol or use illicit drugs. family history includes Colon cancer in her paternal grandmother; Diabetes in her brother; Esophageal cancer in her brother; Heart disease in her mother; and Hypertension in her father. Allergies  Allergen Reactions  . Bactrim (Sulfamethoxazole W-Trimethoprim) Shortness Of Breath  . Levofloxacin Other (See Comments)    seizure  . Pneumovax (Pneumococcal  Polysaccharides) Other (See Comments)    Severe allergy-cellulitis  . Amoxicillin-Pot Clavulanate Other (See Comments)    Patient stated Drs. recommend taking Augmentin due to her PCN allergy.  . Penicillins Rash    unknown  . Haldol (Haloperidol Lactate)     Had unknown reaction many yrs ago per pt  . Morphine And Related     Confused and agitation  . Sudafed (Pseudoephedrine Hcl) Other (See Comments)    seizure      Review of Systems see hpi     Objective:   Physical Exam  Physical Exam  Nursing note and vitals reviewed. Constitutional: Appears well-developed and well-nourished. No distress.  HENT:  Head: Normocephalic and atraumatic.  Right Ear: External ear normal.  Left Ear: External ear normal.  Eyes: Conjunctivae are normal. No scleral icterus.  Neck: Neck supple. Carotid bruit is not present.  Cardiovascular: Normal rate, regular rhythm and normal heart sounds.  Exam reveals no gallop and no friction rub.   No murmur heard. Pulmonary/Chest: Effort normal and breath sounds normal. No respiratory distress. He has no wheezes. no rales.  Lymphadenopathy:    He has no cervical adenopathy.  Neurological:Alert.  Skin: Skin is warm and dry. Not diaphoretic.  Psychiatric: Has a normal mood and  affect.        Assessment & Plan:

## 2012-05-27 LAB — PHENYTOIN LEVEL, TOTAL: Phenytoin Lvl: 2.1 ug/mL — ABNORMAL LOW (ref 10.0–20.0)

## 2012-05-27 LAB — HEMOGLOBIN A1C: Hgb A1c MFr Bld: 6.3 % — ABNORMAL HIGH (ref <5.7)

## 2012-05-29 NOTE — Assessment & Plan Note (Signed)
Asx. Remote sz activity. Repeat dilantin level

## 2012-05-29 NOTE — Assessment & Plan Note (Signed)
Average control. Obtain a1c

## 2012-06-01 ENCOUNTER — Telehealth: Payer: Self-pay | Admitting: *Deleted

## 2012-06-01 NOTE — Telephone Encounter (Signed)
Message copied by Regis Bill on Wed Jun 01, 2012 11:54 AM ------      Message from: Edwyna Perfect      Created: Tue May 31, 2012 10:27 PM       Dilantin level low. Increase dose to 200mg  bid. Sugar avg increasing. Watch diet for sugar/carbs

## 2012-06-01 NOTE — Telephone Encounter (Signed)
LMOM with contact name & number for pt call back RE: lab results and further MD instructions/SLS

## 2012-06-03 NOTE — Telephone Encounter (Signed)
Patient informed; understood & agreed/SLS 

## 2012-06-03 NOTE — Telephone Encounter (Signed)
Patient returned phone call. Best # [phone]

## 2012-06-17 ENCOUNTER — Telehealth: Payer: Self-pay | Admitting: Internal Medicine

## 2012-06-17 MED ORDER — CLONAZEPAM 0.5 MG PO TABS: ORAL_TABLET | ORAL | Status: DC

## 2012-06-17 NOTE — Telephone Encounter (Signed)
Patient states that she is out of Klonopin and would like a refill sent to [facility]

## 2012-06-17 NOTE — Telephone Encounter (Signed)
Please fill with hp directions. #[phone] [id number]

## 2012-06-17 NOTE — Telephone Encounter (Signed)
Rx done/SLS 

## 2012-06-17 NOTE — Telephone Encounter (Signed)
Pt px Monique Fleming refill 12.04.12 #[id number] by Dr. Posey Rea with SIG change to: take [2] tablets TID PRN Sleep [from take 2 tablets QHS Sleep] Prior to physician/office change/transfer to [facility] on 05.20.13 where SIG was changed to: Take 1 tablet Po QAM & up to 1.5 tablets QHS Sleep. Do not see any notation in EMR medication that Rx has been filled since 12.04.12/SLS Please advise.

## 2012-07-01 ENCOUNTER — Other Ambulatory Visit: Payer: Self-pay | Admitting: Internal Medicine

## 2012-07-18 ENCOUNTER — Ambulatory Visit: Payer: Medicare Other | Admitting: Internal Medicine

## 2012-07-18 ENCOUNTER — Telehealth: Payer: Self-pay | Admitting: Internal Medicine

## 2012-07-18 NOTE — Telephone Encounter (Signed)
LMTCB-we can see if patient can come in on Wednesday 07-20-12 at 11:15am to see CY.

## 2012-07-18 NOTE — Telephone Encounter (Signed)
Pt appt was for 2 month follow up. Please advise thanks

## 2012-07-19 NOTE — Telephone Encounter (Signed)
lmomtcb x 2  

## 2012-07-20 ENCOUNTER — Encounter: Payer: Self-pay | Admitting: Internal Medicine

## 2012-07-20 ENCOUNTER — Ambulatory Visit (INDEPENDENT_AMBULATORY_CARE_PROVIDER_SITE_OTHER): Payer: Medicare Other | Admitting: Internal Medicine

## 2012-07-20 ENCOUNTER — Telehealth: Payer: Self-pay | Admitting: Internal Medicine

## 2012-07-20 VITALS — BP 132/86 | HR 86 | Temp 98.3°F | Resp 22 | Wt 138.2 lb

## 2012-07-20 DIAGNOSIS — J3489 Other specified disorders of nose and nasal sinuses: Secondary | ICD-10-CM

## 2012-07-20 DIAGNOSIS — Z79899 Other long term (current) drug therapy: Secondary | ICD-10-CM

## 2012-07-20 DIAGNOSIS — D509 Iron deficiency anemia, unspecified: Secondary | ICD-10-CM

## 2012-07-20 DIAGNOSIS — R569 Unspecified convulsions: Secondary | ICD-10-CM

## 2012-07-20 NOTE — Telephone Encounter (Signed)
°  Please add dilantin level to next visit labs -v58.69  Patient has an appointment for 08/11/12

## 2012-07-20 NOTE — Telephone Encounter (Signed)
I called pt home number and received a busy signal, so I called the pt daughter and spoke with her. She states the pt has an appt with her PCP today. SHe states she will see the pt at that appt and will discuss with her about calling us to r/s follow-up with Dr. Maple Hudson. Nothing further needed at this time.Carron Curie, CMA

## 2012-07-20 NOTE — Patient Instructions (Addendum)
Please add dilantin level to next visit labs -v58.69

## 2012-07-20 NOTE — Assessment & Plan Note (Signed)
Patient's declines increase in Dilantin. Last seizure approximately 5 years on stable dose.

## 2012-07-20 NOTE — Progress Notes (Signed)
Subjective:    Patient ID: Monique Fleming, female    DOB: 23-Apr-1929, 78 y.o.   MRN: [id number]  HPI patient presents to clinic for evaluation of nose discomfort. Notes chronic intermittent congestion and clear drainage but over the past several days after sneezing has noted increasing discomfort. Relates history seeing ENT approximately 5 years ago with what appears to ten by her description a small nasal septal perforation. Was advised to use nasal saline drops. Has an over-the-counter anti-inflammatory at home for the discomfort. Saw small amount of blood from her right nares on one occasion however this was limited and has not recurred. No other alleviating or exacerbating factors. Discussed her last Dilantin level which was very subtherapeutic. Has not had a seizure in approximately 5 years. Was advised to increase her Dilantin dose however she did not stating that her neurologist in the past had told her he was going to keep her on a very low dose without need for increasing it. Has not followed up with neurology.  Past Medical History  Diagnosis Date  . Aortic stenosis     moderate by echo 6/11 gradient 20/2mmHg for mean and peak  . TIA (transient ischemic attack)     Dr. Pearlean Brownie  . Peripheral vascular disease   . IBS (irritable bowel syndrome)   . Diabetes mellitus     type II  . Chronic bronchitis   . COPD (chronic obstructive pulmonary disease)   . Atherosclerosis   . Depression   . Anxiety     Dr Donell Beers  . Renal cyst   . Anemia     iron deficiency  . Cellulitis   . Diverticular disease   . Infectious diarrhea   . Sinusitis   . Thrush   . Abnormal liver function test   . GERD (gastroesophageal reflux disease)   . Insomnia     chronic  . Adenocarcinoma 2008    colon,s/p right hemicolectomy  . Angina   . Seizures     hx of one seizure "  . Cirrhosis of liver     hx of   Past Surgical History  Procedure Date  . Carotid endarterectomy   . Colectomy     right hemi    . Cholecystectomy 11/09    Rt Breast  . Upper gastrointestinal endoscopy     reports that she quit smoking about 3 months ago. Her smoking use included Cigarettes. She smoked .5 packs per day. She has never used smokeless tobacco. She reports that she does not drink alcohol or use illicit drugs. family history includes Colon cancer in her paternal grandmother; Diabetes in her brother; Esophageal cancer in her brother; Heart disease in her mother; and Hypertension in her father. Allergies  Allergen Reactions  . Bactrim (Sulfamethoxazole W-Trimethoprim) Shortness Of Breath  . Levofloxacin Other (See Comments)    seizure  . Pneumovax (Pneumococcal Polysaccharides) Other (See Comments)    Severe allergy-cellulitis  . Amoxicillin-Pot Clavulanate Other (See Comments)    Patient stated Drs. recommend taking Augmentin due to her PCN allergy.  . Penicillins Rash    unknown  . Haldol (Haloperidol Lactate)     Had unknown reaction many yrs ago per pt  . Morphine And Related     Confused and agitation  . Sudafed (Pseudoephedrine Hcl) Other (See Comments)    seizure     Review of Systems see hpi     Objective:   Physical Exam  Nursing note and vitals reviewed. Constitutional: She appears  well-developed and well-nourished. No distress.  HENT:  Head: Normocephalic and atraumatic.  Right Ear: External ear normal.  Left Ear: External ear normal.       Bilateral nares patent. Mild soft tissue swelling of the mucosal. No bleeding or evidence of recent bleeding. However there is a nasal septal perforation approximately 1 cm.  Eyes: Conjunctivae normal are normal. No scleral icterus.  Skin: She is not diaphoretic.          Assessment & Plan:

## 2012-07-20 NOTE — Assessment & Plan Note (Signed)
ENT consultation. Attempt Tylenol when necessary. Attempt to avoid NSAIDs if possible because of Plavix use

## 2012-08-03 ENCOUNTER — Telehealth: Payer: Self-pay | Admitting: Internal Medicine

## 2012-08-03 NOTE — Telephone Encounter (Signed)
Pt added to CY schedule for 08-04-12 at 11:15am.

## 2012-08-04 ENCOUNTER — Ambulatory Visit (INDEPENDENT_AMBULATORY_CARE_PROVIDER_SITE_OTHER): Payer: Medicare Other | Admitting: Internal Medicine

## 2012-08-04 ENCOUNTER — Encounter: Payer: Self-pay | Admitting: Internal Medicine

## 2012-08-04 VITALS — BP 118/68 | HR 88 | Ht 65.25 in | Wt 138.8 lb

## 2012-08-04 DIAGNOSIS — F17200 Nicotine dependence, unspecified, uncomplicated: Secondary | ICD-10-CM

## 2012-08-04 DIAGNOSIS — J449 Chronic obstructive pulmonary disease, unspecified: Secondary | ICD-10-CM

## 2012-08-04 DIAGNOSIS — Z23 Encounter for immunization: Secondary | ICD-10-CM

## 2012-08-04 NOTE — Patient Instructions (Addendum)
Flu vax  Please call as needed  

## 2012-08-04 NOTE — Progress Notes (Signed)
Monique Fleming ID: Monique Fleming, female    DOB: 1929/05/06, 78 y.o.   MRN: [id number]  HPI 46 yoF former smoker with COPD and multiple medical problems.  Last here November 05, 2010. Since then she reports staying off cigarettes and having no significant respiratory events. Pollen bothered more in March with nasal congestion, postnasal drip, but not much wheeze or cough. She feels current meds are adequate.  04/07/11- 81 yoF current smoker with COPD complicated by DM, ASCVD, hx colon cancer, anemia., GERD, esophageal varices. Was hosp for red, inflamed arm after Pneumovax with concern then that she had cellulits. Has had UTI then yeast from antibiotics- to see her GYN Has indigestion with known liver disease and esophageal varices.  Doesn't describe active respiratory complaints- mild occasional cough, dyspnea with exertion unchanged. We talked again about smoking and tried to motivate to full cessation. Smokes a few daily.  06/09/11- 63 yoF current smoker with COPD complicated by DM, ASCVD, hx colon cancer, anemia., GERD, esophageal varices. Reports doing well. Discussed flu shot after her sore arm from the pneumovax, and decided they were different enough that there should not be a similar reaction noted with flu vax.  She has not had significant recent cough, wheeze, phlegm, chest pain or palpitation.   08/11/11-  57 yoF current smoker with COPD complicated by DM, ASCVD, hx colon cancer, anemia., GERD, esophageal varices. She described her problems with bladder infection and C. difficile colitis requiring antibiotics almost continuously for the last 5 months. Her breathing has not changed with weather and season. She gets some raspy cough occasionally but rarely produces sputum. She denies chest pain or palpitation. She is still smoking an occasional cigarette I believe, but much less than before.  10/13/11- 18 yoF current smoker with COPD complicated by DM, ASCVD, hx colon cancer, anemia., GERD,  esophageal varices. Had a viral bronchitis syndrome, largely resolved now.  12/11/11- 82 yoF current smoker with COPD complicated by DM, ASCVD, hx colon cancer, anemia., GERD, esophageal varices Pharmacist suggested she blunt stimulation from  xopenex neb solution by diluting with a little water. She isn't sure the neb makes that much difference. Asks refill Symbicort. Reviewed her complex medical problem list. Not having acute respiratory symptoms now.  02/09/12- 65 yoF former smoker with COPD complicated by DM, ASCVD, hx colon cancer, anemia., GERD, esophageal varices  Pt states breathing is the same, no better or worse since last visit  She assures me that she is not smoking. Breathing feels comfortable with little routine cough or wheeze. Uses Symbicort twice daily. She has not been using her nebulizer machine. CXR 08/27/11 reviewed w/ her IMPRESSION:  Emphysematous changes and mild left lower lobe opacity, likely  scarring or atelectasis. No acute cardiopulmonary process  identified.  Original Report Authenticated By: Monique Fleming, M.D.   03/31/12- ER visit [facility] Hi[facility]: HPI  Monique Fleming is an 78 year old female with a history of multiple medical problems including COPD and continued smoking who presents today complaining of shortness of breath. Monique Fleming reports that this began last night. 2 days ago she was seen by her primary care provider and diagnosed with a urinary tract infection. She was started on Bactrim for this. Monique Fleming feels that her symptoms began after taking this medication at home last night. Initially the Monique Fleming is noted to be wheezing she reports that she always wheezes. Monique Fleming has numerous drug allergies and feels that her symptoms are likely a drug reaction. Monique Fleming had fingerstick of 177 upon  arrival. She was afebrile and hemodynamically stable. She denied any chest pain and has no history of coronary artery disease despite all of her other medical problems. She denies  fevers but does endorse some cough. Monique Fleming has no history of DVT or PE. She denies history of heart failure. Monique Fleming is not currently on any anticoagulation for any reason. She continues to smoke 7-8 cigarettes a day. Exertion appears to make her shortness of breath worse and nothing makes it better. There are no other associated or modifying factors.    04/05/12- 15 yoF former smoker with COPD complicated by DM, ASCVD, hx colon cancer, anemia., GERD, esophageal varices At ER - see note above: COPD exacerb, UTI. Small LUL nodules seemed larger>CT.   Daughter Monique Fleming  here Unfortunately she has admitted still smoking a few cigarettes daily against all our counseling.  COPD assessment test (CAT) 23/40 Emergency room visit June 27 for COPD exacerbation and UTI. She understood chest x-ray showed "nodules". When she called about it we ordered CT. She still feels tight on a prednisone taper ending today. Was on sulfa for UTI- changed to keflex.  CT chest 04/05/12- reviewed images and report with them IMPRESSION:  1. No acute findings.  2. Emphysema.  3. No pathologic adenopathy within the mediastinum or hilar  regions.  4. Prominent coronary artery calcifications  5. Cirrhosis.  Original Report Authenticated By: Monique Fleming, M.D.   05/16/12-82 yoF former smoker with COPD complicated by DM, ASCVD, hx colon cancer, anemia., GERD, esophageal varices Denies any SOB, wheezing, cough, or congestion at this time. COPD assessment test (CAT) 15/40 Definitely feels better than last visit   08/04/12- 82 yoF former smoker with COPD complicated by DM, ASCVD, hx colon cancer, anemia., GERD, esophageal varices Breathing about the same as last visit; had few nose bleeds last week. Epistaxis needed packing because of Plavix. COPD assessment test (CAT) score 22/40  Review of Systems-See HPI Constitutional:   No weight loss, night sweats,  Fevers, chills,  fatigue, lassitude. HEENT:   No headaches,  Difficulty  swallowing,  Tooth/dental problems,  Sore throat,                No sneezing, itching, ear ache, nasal congestion, post nasal drip,  CV:  No chest pain, orthopnea, PND, swelling in lower extremities, anasarca, dizziness, palpitations GI  No heartburn, indigestion, abdominal pain, nausea, vomiting,  Resp: + shortness of breath with routine exertion or at rest.  No excess mucus,   No coughing up of blood.  No change in color of mucus.  Skin: no rash or lesions. GU: MS:  No joint pain or swelling.  Marland Kitchen Psych:  No change in mood or affect. + depression or anxiety.  No memory loss.  Objective:   Physical Exam BP 118/68  Pulse 88  Ht 5' 5.25" (1.657 m)  Wt 138 lb 12.8 oz (62.959 kg)  BMI 22.92 kg/m2  SpO2 94% General- Alert, Oriented, Affect-appropriate, Distress- none acute; calm and talkative today Skin- rash-none, lesions- none, excoriation- none. Dry.  Lymphadenopathy- none Head- atraumatic            Eyes- Gross vision intact, PERRLA, conjunctivae clear secretions            Ears- Hearing-diminished            Nose- Clear, no-Septal dev, mucus, polyps, erosion, perforation             Throat- Mallampati II , mucosa clear , drainage- none, tonsils-  atrophic. Mild thrush Neck- flexible , trachea midline, no stridor , thyroid nl, carotid no bruit Chest - symmetrical excursion , unlabored           Heart/CV- RRR , 1-2/6 systolic precordial murmur , no gallop  , no rub, nl s1 s2                           - JVD- none , edema- none, stasis changes- none, varices- none           Lung-  +distant, wheeze- none, no cough , dullness-none, rub- none. +Breathless speech pattern seems chronic.           Chest wall-  Abd- Br/ Gen/ Rectal- Not done, not indicated Extrem- cyanosis- none, clubbing, none, atrophy- none, strength- nl Neuro- grossly intact to observation

## 2012-08-10 LAB — HEPATIC FUNCTION PANEL
AST: 17 U/L (ref 0–37)
Albumin: 3.1 g/dL — ABNORMAL LOW (ref 3.5–5.2)
Alkaline Phosphatase: 100 U/L (ref 39–117)
Bilirubin, Direct: 0.1 mg/dL (ref 0.0–0.3)
Indirect Bilirubin: 0.3 mg/dL (ref 0.0–0.9)
Total Bilirubin: 0.4 mg/dL (ref 0.3–1.2)

## 2012-08-10 LAB — CBC
Hemoglobin: 9.2 g/dL — ABNORMAL LOW (ref 12.0–15.0)
MCH: 22.9 pg — ABNORMAL LOW (ref 26.0–34.0)
MCV: 74.1 fL — ABNORMAL LOW (ref 78.0–100.0)
RBC: 4.01 MIL/uL (ref 3.87–5.11)

## 2012-08-10 LAB — LIPID PANEL
LDL Cholesterol: 76 mg/dL (ref 0–99)
Total CHOL/HDL Ratio: 5.9 Ratio
VLDL: 32 mg/dL (ref 0–40)

## 2012-08-10 LAB — BASIC METABOLIC PANEL
CO2: 28 mEq/L (ref 19–32)
Chloride: 107 mEq/L (ref 96–112)
Creat: 0.77 mg/dL (ref 0.50–1.10)

## 2012-08-10 LAB — FERRITIN: Ferritin: 12 ng/mL (ref 10–291)

## 2012-08-10 NOTE — Telephone Encounter (Signed)
Orders released to lab; red top not drawn for Dilantin & pt could not return today; reorder for lab tomorrow/SLS

## 2012-08-11 ENCOUNTER — Ambulatory Visit: Payer: Medicare Other | Admitting: Internal Medicine

## 2012-08-15 ENCOUNTER — Ambulatory Visit (INDEPENDENT_AMBULATORY_CARE_PROVIDER_SITE_OTHER): Payer: Medicare Other | Admitting: Internal Medicine

## 2012-08-15 ENCOUNTER — Encounter: Payer: Self-pay | Admitting: Internal Medicine

## 2012-08-15 VITALS — BP 144/72 | HR 90 | Temp 98.0°F | Resp 18 | Wt 138.8 lb

## 2012-08-15 DIAGNOSIS — D649 Anemia, unspecified: Secondary | ICD-10-CM

## 2012-08-15 DIAGNOSIS — D509 Iron deficiency anemia, unspecified: Secondary | ICD-10-CM

## 2012-08-15 NOTE — Assessment & Plan Note (Signed)
She is trying to remain off cigarettes and is encouraged.

## 2012-08-15 NOTE — Progress Notes (Signed)
Subjective:    Patient ID: Monique Fleming, female    DOB: 08/18/1929, 78 y.o.   MRN: [id number]  HPI Pt presents to clinic for followup of multiple medical problems. Notes increasing fatigue. Reviewed hemoglobin down from 11.9 to 9's. Denies abdominal pain but does believe she sees blood in her stool intermittently. Does have history of colon cancer and cirrhosis. No recent GI evaluation. Also states hematology previously followed her and she has stopped her iron pill.  Past Medical History  Diagnosis Date  . Aortic stenosis     moderate by echo 6/11 gradient 20/73mmHg for mean and peak  . TIA (transient ischemic attack)     Dr. Pearlean Brownie  . Peripheral vascular disease   . IBS (irritable bowel syndrome)   . Diabetes mellitus     type II  . Chronic bronchitis   . COPD (chronic obstructive pulmonary disease)   . Atherosclerosis   . Depression   . Anxiety     Dr Donell Beers  . Renal cyst   . Anemia     iron deficiency  . Cellulitis   . Diverticular disease   . Infectious diarrhea   . Sinusitis   . Thrush   . Abnormal liver function test   . GERD (gastroesophageal reflux disease)   . Insomnia     chronic  . Adenocarcinoma 2008    colon,s/p right hemicolectomy  . Angina   . Seizures     hx of one seizure "  . Cirrhosis of liver     hx of   Past Surgical History  Procedure Date  . Carotid endarterectomy   . Colectomy     right hemi  . Cholecystectomy 11/09    Rt Breast  . Upper gastrointestinal endoscopy     reports that she quit smoking about 4 months ago. Her smoking use included Cigarettes. She smoked .5 packs per day. She has never used smokeless tobacco. She reports that she does not drink alcohol or use illicit drugs. family history includes Colon cancer in her paternal grandmother; Diabetes in her brother; Esophageal cancer in her brother; Heart disease in her mother; and Hypertension in her father. Allergies  Allergen Reactions  . Bactrim (Sulfamethoxazole  W-Trimethoprim) Shortness Of Breath  . Levofloxacin Other (See Comments)    seizure  . Pneumovax (Pneumococcal Polysaccharides) Other (See Comments)    Severe allergy-cellulitis  . Amoxicillin-Pot Clavulanate Other (See Comments)    Patient stated Drs. recommend taking Augmentin due to her PCN allergy.  . Penicillins Rash    unknown  . Haldol (Haloperidol Lactate)     Had unknown reaction many yrs ago per pt  . Morphine And Related     Confused and agitation  . Sudafed (Pseudoephedrine Hcl) Other (See Comments)    seizure      Review of Systems see hpi     Objective:   Physical Exam  Nursing note and vitals reviewed. Constitutional: She appears well-developed and well-nourished. No distress.  HENT:  Head: Normocephalic and atraumatic.  Eyes: Conjunctivae normal are normal. No scleral icterus.  Neck: Neck supple.  Cardiovascular: Normal rate, regular rhythm and normal heart sounds.   Pulmonary/Chest: Effort normal and breath sounds normal.  Abdominal: Soft. Bowel sounds are normal. She exhibits no distension and no mass. There is no tenderness. There is no rebound and no guarding.  Neurological: She is alert.  Skin: She is not diaphoretic.  Psychiatric: She has a normal mood and affect.  Assessment & Plan:

## 2012-08-15 NOTE — Assessment & Plan Note (Signed)
Declining hemoglobin with reported intermittent blood in stool. No current gross active bleeding. Repeat CBC today for stability. Resume iron supplementation. Schedule GI followup next available. Recommended to present to the emergency department if develops abdominal pain gross active bleeding presyncope syncope chest pain or shortness of breath. States understanding and agreement.

## 2012-08-15 NOTE — Assessment & Plan Note (Signed)
Chronic bronchitis pattern without acute exacerbation. Adequate control. Plan-flu vaccine

## 2012-08-16 LAB — CBC WITH DIFFERENTIAL/PLATELET
Basophils Absolute: 0.1 10*3/uL (ref 0.0–0.1)
Basophils Relative: 1 % (ref 0–1)
Eosinophils Relative: 3 % (ref 0–5)
Lymphocytes Relative: 23 % (ref 12–46)
MCV: 74.9 fL — ABNORMAL LOW (ref 78.0–100.0)
Platelets: 154 10*3/uL (ref 150–400)
RDW: 16.3 % — ABNORMAL HIGH (ref 11.5–15.5)
WBC: 8.1 10*3/uL (ref 4.0–10.5)

## 2012-08-31 ENCOUNTER — Ambulatory Visit (INDEPENDENT_AMBULATORY_CARE_PROVIDER_SITE_OTHER): Payer: Medicare Other | Admitting: Internal Medicine

## 2012-08-31 ENCOUNTER — Encounter: Payer: Self-pay | Admitting: Internal Medicine

## 2012-08-31 VITALS — BP 148/70 | HR 100 | Ht 65.25 in | Wt 136.2 lb

## 2012-08-31 DIAGNOSIS — K5520 Angiodysplasia of colon without hemorrhage: Secondary | ICD-10-CM

## 2012-08-31 DIAGNOSIS — Z85038 Personal history of other malignant neoplasm of large intestine: Secondary | ICD-10-CM

## 2012-08-31 DIAGNOSIS — Q2733 Arteriovenous malformation of digestive system vessel: Secondary | ICD-10-CM

## 2012-08-31 DIAGNOSIS — K319 Disease of stomach and duodenum, unspecified: Secondary | ICD-10-CM

## 2012-08-31 DIAGNOSIS — Z86010 Personal history of colonic polyps: Secondary | ICD-10-CM

## 2012-08-31 DIAGNOSIS — D509 Iron deficiency anemia, unspecified: Secondary | ICD-10-CM

## 2012-08-31 DIAGNOSIS — K3189 Other diseases of stomach and duodenum: Secondary | ICD-10-CM

## 2012-08-31 DIAGNOSIS — R197 Diarrhea, unspecified: Secondary | ICD-10-CM

## 2012-08-31 NOTE — Progress Notes (Signed)
HISTORY OF PRESENT ILLNESS:  Monique Fleming is a 78 y.o. female with multiple significant medical problems including, but not limited to, diabetes mellitus, cerebrovascular disease with prior stroke, aortic stenosis, COPD, hepatic cirrhosis, colon cancer status post right hemicolectomy, Clostridium difficile diarrhea, chronic noninfectious diarrhea, anxiety, depression, prior cholecystectomy, prior carotid endarterectomy. She is on chronic Plavix therapy. She sent today by her primary care provider regarding iron deficiency anemia. Her hemoglobin is down approximately 2 g. She is accompanied today by her daughter. She has had chronic fatigue. Ongoing problems with diarrhea which is better when she is compliant with Questran. I last saw the patient 05/03/2012 regarding nausea bloating. See that dictation. She denies bleeding. Her last hemoglobin was 9.3 with MCV of 74.9. They're concerned about colon cancer. Her last complete colonoscopy, to evaluate iron deficiency anemia and elevated CEA, was April 2011. She was found to have multiple subcentimeter colon polyps which were removed and found to be adenomatous. Evidence of prior right hemicolectomy. Several large right colon AVMs. Prior upper endoscopy in February 2013 to screen for varices was remarkable only for mild portal gastropathy. At the time of her last colonoscopy she was to be on iron indefinitely. She has been off iron for quite some time. Started back 2 weeks ago. Tolerating iron.  REVIEW OF SYSTEMS:  All non-GI ROS negative except for sinus trouble  Past Medical History  Diagnosis Date  . Aortic stenosis     moderate by echo 6/11 gradient 20/59mmHg for mean and peak  . TIA (transient ischemic attack)     Dr. Pearlean Brownie  . Peripheral vascular disease   . IBS (irritable bowel syndrome)   . Diabetes mellitus     type II  . Chronic bronchitis   . COPD (chronic obstructive pulmonary disease)   . Atherosclerosis   . Depression   . Anxiety    Dr Donell Beers  . Renal cyst   . Anemia     iron deficiency  . Cellulitis   . Diverticular disease   . Infectious diarrhea   . Sinusitis   . Thrush   . Abnormal liver function test   . GERD (gastroesophageal reflux disease)   . Insomnia     chronic  . Adenocarcinoma 2008    colon,s/p right hemicolectomy  . Angina   . Seizures     hx of one seizure "  . Cirrhosis of liver     hx of  . Clostridium difficile diarrhea     Past Surgical History  Procedure Date  . Carotid endarterectomy   . Colectomy     right hemi  . Cholecystectomy 11/09    Rt Breast  . Upper gastrointestinal endoscopy     Social History Monique Fleming  reports that she quit smoking about 5 months ago. Her smoking use included Cigarettes. She smoked .5 packs per day. She has never used smokeless tobacco. She reports that she does not drink alcohol or use illicit drugs.  family history includes Colon cancer in her paternal grandmother; Diabetes in her brother; Esophageal cancer in her brother; Heart disease in her mother; and Hypertension in her father.  Allergies  Allergen Reactions  . Bactrim (Sulfamethoxazole W-Trimethoprim) Shortness Of Breath  . Levofloxacin Other (See Comments)    seizure  . Pneumovax (Pneumococcal Polysaccharides) Other (See Comments)    Severe allergy-cellulitis  . Amoxicillin-Pot Clavulanate Other (See Comments)    Patient stated Drs. recommend taking Augmentin due to her PCN allergy.  . Penicillins Rash  unknown  . Haldol (Haloperidol Lactate)     Had unknown reaction many yrs ago per pt  . Morphine And Related     Confused and agitation  . Sudafed (Pseudoephedrine Hcl) Other (See Comments)    seizure       PHYSICAL EXAMINATION: Vital signs: BP 148/70  Pulse 100  Ht 5' 5.25" (1.657 m)  Wt 136 lb 3.2 oz (61.78 kg)  BMI 22.49 kg/m2  Constitutional: generally well-appearing, no acute distress Psychiatric: alert and oriented x3, cooperative Eyes: extraocular movements  intact, anicteric, conjunctiva pink Mouth: oral pharynx moist, no lesions Neck: supple no lymphadenopathy Cardiovascular: heart regular rate and rhythm, no murmur Lungs: clear to auscultation bilaterally Abdomen: soft, nontender, nondistended, no obvious ascites, no peritoneal signs, normal bowel sounds, no organomegaly Rectal:deferred until colonoscopy Extremities: no lower extremity edema bilaterally Skin: no lesions on visible extremities Neuro: No focal deficits.   ASSESSMENT:  #1. Iron deficiency anemia. Most likely due to known AVMs. #2. Personal history of colon cancer status post right hemicolectomy 2008. #3. History of adenomatous colon polyps #4. Hepatic cirrhosis #5. Chronic diarrhea   PLAN:  #1. Iron 3 times a day indefinitely #2. Colonoscopy to rule out neoplasia as a contributor to iron deficiency anemia. The patient and her daughter were quite definite that they want to pursue this to be "100% sure".The nature of the procedure, as well as the risks, benefits, and alternatives were carefully and thoroughly reviewed with the patient. Ample time for discussion and questions allowed. The patient understood, was satisfied, and agreed to proceed. The patient is high-risk given her age and comorbidities. #3. The patient will STAY ON PLAVIX for her procedure. They wanted to schedule this after the new year. They can see the previsit nurse directly. In addition to stay on Plavix, she should hold her iron one week prior to the procedure. Also, hold diabetic medications the day of the procedure. #4. Repeat blood count about 4 weeks to assess response to iron  All the above reviewed with patient and her daughter

## 2012-08-31 NOTE — Patient Instructions (Addendum)
When you are ready to schedule a colonoscopy, please give our office a call.

## 2012-09-07 ENCOUNTER — Telehealth: Payer: Self-pay | Admitting: *Deleted

## 2012-09-07 NOTE — Telephone Encounter (Signed)
Pt was seen in office 11.11.13 for chronic diarrhea w/blood in stools [Hx of Colon Cancer]; pt was referred to GI [Dr. Perry] for further evaluation & testing. Pt reports that from Xray & in-office exam, she was informed at GI office that "it was believed that she has hemorrhoids & multiple small polyps that the bleeding is coming from"; pt declined colonoscopy until after the Christmas Holiday & reports that Dr. [name] felt that "this would be Ok". Pt states that she believes the "bleeding seems like has increased & she went through this w/colon cancer & had to have plasma [tranfusion] with Dr. Myna Hidalgo".  Inquired w/patient as to if she was having any symptoms discussed at her 11.11.13 OV Plan & Assessment: Declining hemoglobin with reported intermittent blood in stool. No current gross active bleeding. Repeat CBC today for stability. Resume iron supplementation. Schedule GI followup next available. Recommended to present to the emergency department if develops abdominal pain gross active bleeding presyncope syncope chest pain or shortness of breath. States understanding and agreement. Pt reports that yesterday after being out & active for awhile, she began to "feel faint & like she may have syncope episode, but then was all right after resting". Reiterated to patient that she was instructed to report to Emergency department if these Sxs became present & that was the best plan for her at this time; report to UC/ED for A&E. Pt reluctant & questioned the need [and asked about contacting Dr. Myna Hidalgo RE: possible declining Hgb]; again, reiterated that w/her past medical Hx & ongoing symptoms that she is reportedly stating are believed to be worsening, our best advise to her would be to report to emergency facility for A&E, pt stated that she understood & then hung up the phone [provider aware]/SLS

## 2012-09-15 ENCOUNTER — Ambulatory Visit (INDEPENDENT_AMBULATORY_CARE_PROVIDER_SITE_OTHER): Payer: Medicare Other | Admitting: Internal Medicine

## 2012-09-15 VITALS — BP 116/70 | HR 83 | Temp 98.1°F | Resp 20 | Wt 134.0 lb

## 2012-09-15 DIAGNOSIS — J3489 Other specified disorders of nose and nasal sinuses: Secondary | ICD-10-CM

## 2012-09-15 DIAGNOSIS — D509 Iron deficiency anemia, unspecified: Secondary | ICD-10-CM

## 2012-09-15 DIAGNOSIS — D649 Anemia, unspecified: Secondary | ICD-10-CM

## 2012-09-15 LAB — CBC WITH DIFFERENTIAL/PLATELET
Basophils Relative: 1 % (ref 0–1)
Hemoglobin: 11.2 g/dL — ABNORMAL LOW (ref 12.0–15.0)
MCHC: 31.3 g/dL (ref 30.0–36.0)
Monocytes Relative: 9 % (ref 3–12)
Neutro Abs: 4.6 10*3/uL (ref 1.7–7.7)
Neutrophils Relative %: 60 % (ref 43–77)
Platelets: 182 10*3/uL (ref 150–400)
RBC: 4.67 MIL/uL (ref 3.87–5.11)

## 2012-09-15 MED ORDER — AZITHROMYCIN 250 MG PO TABS: ORAL_TABLET | ORAL | Status: AC

## 2012-09-17 ENCOUNTER — Encounter: Payer: Self-pay | Admitting: Internal Medicine

## 2012-09-17 NOTE — Progress Notes (Signed)
  Subjective:    Patient ID: Monique Fleming, female    DOB: 02-Jul-1929, 78 y.o.   MRN: [id number]  HPI Pt presents to clinic for re-evaluation of anemia. Now taking feso4 daily but no improvement of fatigue. S/p GI visit and requested to postpone colonoscopy until first of the year. States occasionally sees blood in stool but believes may be from hemorrhoid. Also s/p ENT visit for nasal septal perforation. Took zpak with some improvement of nasal irritation and requests repeat abx.  Past Medical History  Diagnosis Date  . Aortic stenosis     moderate by echo 6/11 gradient 20/11mmHg for mean and peak  . TIA (transient ischemic attack)     Dr. Pearlean Brownie  . Peripheral vascular disease   . IBS (irritable bowel syndrome)   . Diabetes mellitus     type II  . Chronic bronchitis   . COPD (chronic obstructive pulmonary disease)   . Atherosclerosis   . Depression   . Anxiety     Dr Donell Beers  . Renal cyst   . Anemia     iron deficiency  . Cellulitis   . Diverticular disease   . Infectious diarrhea   . Sinusitis   . Thrush   . Abnormal liver function test   . GERD (gastroesophageal reflux disease)   . Insomnia     chronic  . Adenocarcinoma 2008    colon,s/p right hemicolectomy  . Angina   . Seizures     hx of one seizure "  . Cirrhosis of liver     hx of  . Clostridium difficile diarrhea    Past Surgical History  Procedure Date  . Carotid endarterectomy   . Colectomy     right hemi  . Cholecystectomy 11/09    Rt Breast  . Upper gastrointestinal endoscopy     reports that she quit smoking about 5 months ago. Her smoking use included Cigarettes. She smoked .5 packs per day. She has never used smokeless tobacco. She reports that she does not drink alcohol or use illicit drugs. family history includes Colon cancer in her paternal grandmother; Diabetes in her brother; Esophageal cancer in her brother; Heart disease in her mother; and Hypertension in her father. Allergies  Allergen  Reactions  . Bactrim (Sulfamethoxazole W-Trimethoprim) Shortness Of Breath  . Levofloxacin Other (See Comments)    seizure  . Pneumovax (Pneumococcal Polysaccharides) Other (See Comments)    Severe allergy-cellulitis  . Amoxicillin-Pot Clavulanate Other (See Comments)    Patient stated Drs. recommend taking Augmentin due to her PCN allergy.  . Penicillins Rash    unknown  . Haldol (Haloperidol Lactate)     Had unknown reaction many yrs ago per pt  . Morphine And Related     Confused and agitation  . Sudafed (Pseudoephedrine Hcl) Other (See Comments)    seizure     Review of Systems see hpi     Objective:   Physical Exam  Nursing note and vitals reviewed. Constitutional: She appears well-developed and well-nourished. No distress.  HENT:  Head: Normocephalic and atraumatic.  Neurological: She is alert.  Skin: She is not diaphoretic.  Psychiatric: She has a normal mood and affect.          Assessment & Plan:

## 2012-09-17 NOTE — Assessment & Plan Note (Signed)
Repeat cbc-now taking iron. Previously followed by hematology per pt and expresses interest in seeing again. Referral order initiated. Encouraged to schedule colonoscopy and states agreement.

## 2012-09-17 NOTE — Assessment & Plan Note (Signed)
S/p ENT consult. Repeat abx.

## 2012-09-19 ENCOUNTER — Other Ambulatory Visit: Payer: Medicare Other | Admitting: Lab

## 2012-09-19 ENCOUNTER — Ambulatory Visit: Payer: Medicare Other | Admitting: Medical

## 2012-09-22 ENCOUNTER — Ambulatory Visit: Payer: Medicare Other | Admitting: Internal Medicine

## 2012-09-23 ENCOUNTER — Encounter: Payer: Self-pay | Admitting: Internal Medicine

## 2012-09-27 ENCOUNTER — Ambulatory Visit (AMBULATORY_SURGERY_CENTER): Payer: Medicare Other | Admitting: *Deleted

## 2012-09-27 VITALS — Ht 65.5 in | Wt 131.4 lb

## 2012-09-27 DIAGNOSIS — D509 Iron deficiency anemia, unspecified: Secondary | ICD-10-CM

## 2012-09-27 MED ORDER — MOVIPREP 100 G PO SOLR: 1.0000 | Freq: Once | ORAL | Status: DC

## 2012-09-30 ENCOUNTER — Encounter: Payer: Self-pay | Admitting: Family

## 2012-09-30 ENCOUNTER — Ambulatory Visit (INDEPENDENT_AMBULATORY_CARE_PROVIDER_SITE_OTHER): Payer: Medicare Other | Admitting: Family

## 2012-09-30 VITALS — BP 142/66 | HR 73 | Temp 97.6°F | Resp 18 | Wt 131.2 lb

## 2012-09-30 DIAGNOSIS — R34 Anuria and oliguria: Secondary | ICD-10-CM

## 2012-09-30 DIAGNOSIS — N390 Urinary tract infection, site not specified: Secondary | ICD-10-CM

## 2012-09-30 DIAGNOSIS — R8290 Unspecified abnormal findings in urine: Secondary | ICD-10-CM

## 2012-09-30 DIAGNOSIS — R82998 Other abnormal findings in urine: Secondary | ICD-10-CM

## 2012-09-30 DIAGNOSIS — R300 Dysuria: Secondary | ICD-10-CM

## 2012-09-30 LAB — POCT URINALYSIS DIPSTICK
Ketones, UA: NEGATIVE
Nitrite, UA: POSITIVE
Urobilinogen, UA: 0.2

## 2012-09-30 MED ORDER — NITROFURANTOIN MACROCRYSTAL 50 MG PO CAPS: 50.0000 mg | ORAL_CAPSULE | Freq: Four times a day (QID) | ORAL | Status: DC

## 2012-09-30 NOTE — Assessment & Plan Note (Addendum)
UA is reviewed and notes + nitrites and small leuks along with moderate blood.  Will send urine for culture. She is currently afebrile.  She has multiple drug allergies, but tells me that she has tolerated macrodantin.

## 2012-09-30 NOTE — Progress Notes (Signed)
Subjective:    Patient ID: Monique Fleming, female    DOB: 19-Feb-1929, 78 y.o.   MRN: [id number]  HPI  [name] is an 78 yr old female who presents today with chief complaint of dysuria. Started to have chills/fever on Christmas Eve.  She reports + frequency, dysuria.  Denies unusual low back pain.  She reports that urine has been blood tinged.     She tells me that per Dr. Ilda Foil instructions, she has scheduled a colonoscopy with Dr. [name].   Review of Systems See HPI  Past Medical History  Diagnosis Date  . Aortic stenosis     moderate by echo 6/11 gradient 20/74mmHg for mean and peak  . TIA (transient ischemic attack)     Dr. Pearlean Brownie  . Peripheral vascular disease   . IBS (irritable bowel syndrome)   . Diabetes mellitus     type II  . Chronic bronchitis   . COPD (chronic obstructive pulmonary disease)   . Atherosclerosis   . Depression   . Anxiety     Dr Donell Beers  . Renal cyst   . Anemia     iron deficiency  . Cellulitis   . Diverticular disease   . Infectious diarrhea   . Sinusitis   . Thrush   . Abnormal liver function test   . GERD (gastroesophageal reflux disease)   . Insomnia     chronic  . Adenocarcinoma 2008    colon,s/p right hemicolectomy  . Angina   . Seizures     hx of one seizure "  . Cirrhosis of liver     hx of  . Clostridium difficile diarrhea     History   Social History  . Marital Status: Widowed    Spouse Name: N/A    Number of Children: N/A  . Years of Education: N/A   Occupational History  . retired    Social History Main Topics  . Smoking status: Former Smoker -- 0.5 packs/day    Types: Cigarettes    Quit date: 03/29/2012  . Smokeless tobacco: Current User     Comment: Using Electronic cigarette  . Alcohol Use: No  . Drug Use: No  . Sexually Active: No   Other Topics Concern  . Not on file   Social History Narrative   Patient signed a Designated Party Release to allow her daughter, [name], to have access to her  medical records/information. Entered by Daphane Shepherd March 24,2011 @ 2:47 pmDailly Caffeine Use    Past Surgical History  Procedure Date  . Carotid endarterectomy   . Colectomy     right hemi  . Cholecystectomy 11/09    Rt Breast  . Upper gastrointestinal endoscopy     Family History  Problem Relation Age of Onset  . Colon cancer Paternal Grandmother   . Esophageal cancer Brother   . Heart disease Mother   . Diabetes Brother   . Hypertension Father   . Rectal cancer Neg Hx   . Stomach cancer Neg Hx     Allergies  Allergen Reactions  . Bactrim (Sulfamethoxazole W-Trimethoprim) Shortness Of Breath  . Levofloxacin Other (See Comments)    seizure  . Pneumovax (Pneumococcal Polysaccharides) Other (See Comments)    Severe allergy-cellulitis  . Amoxicillin-Pot Clavulanate Other (See Comments)    Patient stated Drs. recommend taking Augmentin due to her PCN allergy.  . Penicillins Rash    unknown  . Haldol (Haloperidol Lactate)     Had unknown  reaction many yrs ago per pt  . Morphine And Related     Confused and agitation  . Sudafed (Pseudoephedrine Hcl) Other (See Comments)    seizure    Current Outpatient Prescriptions on File Prior to Visit  Medication Sig Dispense Refill  . albuterol (PROVENTIL HFA;VENTOLIN HFA) 108 (90 BASE) MCG/ACT inhaler Inhale 1-2 puffs into the lungs every 6 (six) hours as needed for wheezing.  1 Inhaler  0  . budesonide-formoterol (SYMBICORT) 80-4.5 MCG/ACT inhaler Inhale 2 puffs into the lungs 2 (two) times daily. Rinse mouth  3 Inhaler  3  . Cholecalciferol (VITAMIN D3) 1000 UNITS tablet Take 1,000 Units by mouth daily.        . cholestyramine light (PREVALITE) 4 GM/DOSE powder Take by mouth as needed.      . clonazePAM (KLONOPIN) 0.5 MG tablet Take on tablet in the morning and up to 1 and 1/2 tablet (1.5  total) at bedtime as needed For sleep  75 tablet  1  . clopidogrel (PLAVIX) 75 MG tablet Take 1 tablet (75 mg total) by mouth daily.  90  tablet  3  . Cyanocobalamin (VITAMIN B-12) 500 MCG SUBL Place 1 tablet (500 mcg total) under the tongue 1 day or 1 dose.  100 tablet  3  . esomeprazole (NEXIUM) 40 MG capsule Take 40 mg by mouth 2 (two) times daily. Patient uses this medication for acid reflux.      . ferrous sulfate 325 (65 FE) MG tablet Take 325 mg by mouth daily with breakfast.      . glimepiride (AMARYL) 1 MG tablet Take 1 mg by mouth 2 (two) times daily.       Marland Kitchen glucose blood test strip Use as instructed  100 each  3  . MOVIPREP 100 G SOLR Take 1 kit (100 g total) by mouth once.  1 kit  0  . phenytoin (DILANTIN) 100 MG ER capsule Take 100 mg by mouth daily. Take 100 mg every  Morning and 200 mg at bedtime      . potassium chloride (KLOR-CON) 10 MEQ CR tablet Take 20 mEq by mouth 2 (two) times daily.        BP 142/66  Pulse 73  Temp 97.6 F (36.4 C) (Oral)  Resp 18  Wt 131 lb 4 oz (59.535 kg)  SpO2 93%       Objective:   Physical Exam  Constitutional: She appears well-developed and well-nourished. No distress.  Cardiovascular: Normal rate and regular rhythm.   No murmur heard. Pulmonary/Chest: Effort normal and breath sounds normal. No respiratory distress. She has no wheezes. She has no rales. She exhibits no tenderness.  Abdominal: Soft. She exhibits no distension. There is no tenderness.  Genitourinary:       Neg CVAT bilaterally.           Assessment & Plan:

## 2012-09-30 NOTE — Addendum Note (Signed)
Addended by: Regis Bill on: 09/30/2012 03:55 PM   Modules accepted: Orders

## 2012-09-30 NOTE — Patient Instructions (Addendum)
Urinary Tract Infection Urinary tract infections (UTIs) can develop anywhere along your urinary tract. Your urinary tract is your body's drainage system for removing wastes and extra water. Your urinary tract includes two kidneys, two ureters, a bladder, and a urethra. Your kidneys are a pair of bean-shaped organs. Each kidney is about the size of your fist. They are located below your ribs, one on each side of your spine. CAUSES Infections are caused by microbes, which are microscopic organisms, including fungi, viruses, and bacteria. These organisms are so small that they can only be seen through a microscope. Bacteria are the microbes that most commonly cause UTIs. SYMPTOMS  Symptoms of UTIs may vary by age and gender of the patient and by the location of the infection. Symptoms in young women typically include a frequent and intense urge to urinate and a painful, burning feeling in the bladder or urethra during urination. Older women and men are more likely to be tired, shaky, and weak and have muscle aches and abdominal pain. A fever may mean the infection is in your kidneys. Other symptoms of a kidney infection include pain in your back or sides below the ribs, nausea, and vomiting. DIAGNOSIS To diagnose a UTI, your caregiver will ask you about your symptoms. Your caregiver also will ask to provide a urine sample. The urine sample will be tested for bacteria and white blood cells. White blood cells are made by your body to help fight infection. TREATMENT  Typically, UTIs can be treated with medication. Because most UTIs are caused by a bacterial infection, they usually can be treated with the use of antibiotics. The choice of antibiotic and length of treatment depend on your symptoms and the type of bacteria causing your infection. HOME CARE INSTRUCTIONS  If you were prescribed antibiotics, take them exactly as your caregiver instructs you. Finish the medication even if you feel better after you  have only taken some of the medication.  Drink enough water and fluids to keep your urine clear or pale yellow.  Avoid caffeine, tea, and carbonated beverages. They tend to irritate your bladder.  Empty your bladder often. Avoid holding urine for long periods of time.  Empty your bladder before and after sexual intercourse.  After a bowel movement, women should cleanse from front to back. Use each tissue only once. SEEK MEDICAL CARE IF:   You have back pain.  You develop a fever.  Your symptoms do not begin to resolve within 3 days. SEEK IMMEDIATE MEDICAL CARE IF:   You have severe back pain or lower abdominal pain.  You develop chills.  You have nausea or vomiting.  You have continued burning or discomfort with urination. MAKE SURE YOU:   Understand these instructions.  Will watch your condition.  Will get help right away if you are not doing well or get worse. Document Released: 07/01/2005 Document Revised: 03/22/2012 Document Reviewed: 10/30/2011 [employer] Patient Information 2013 [employer]  

## 2012-10-06 ENCOUNTER — Encounter: Payer: Self-pay | Admitting: Internal Medicine

## 2012-10-06 ENCOUNTER — Ambulatory Visit (INDEPENDENT_AMBULATORY_CARE_PROVIDER_SITE_OTHER): Payer: Medicare Other | Admitting: Internal Medicine

## 2012-10-06 VITALS — BP 124/66 | HR 72 | Ht 65.25 in | Wt 132.2 lb

## 2012-10-06 DIAGNOSIS — J449 Chronic obstructive pulmonary disease, unspecified: Secondary | ICD-10-CM

## 2012-10-06 DIAGNOSIS — F17200 Nicotine dependence, unspecified, uncomplicated: Secondary | ICD-10-CM

## 2012-10-06 NOTE — Patient Instructions (Addendum)
I am glad you are doing so well.   Please call as needed

## 2012-10-06 NOTE — Progress Notes (Signed)
Patient ID: Monique Fleming, female    DOB: 1929/05/06, 79 y.o.   MRN: [id number]  HPI 46 yoF former smoker with COPD and multiple medical problems.  Last here November 05, 2010. Since then she reports staying off cigarettes and having no significant respiratory events. Pollen bothered more in March with nasal congestion, postnasal drip, but not much wheeze or cough. She feels current meds are adequate.  04/07/11- 81 yoF current smoker with COPD complicated by DM, ASCVD, hx colon cancer, anemia., GERD, esophageal varices. Was hosp for red, inflamed arm after Pneumovax with concern then that she had cellulits. Has had UTI then yeast from antibiotics- to see her GYN Has indigestion with known liver disease and esophageal varices.  Doesn't describe active respiratory complaints- mild occasional cough, dyspnea with exertion unchanged. We talked again about smoking and tried to motivate to full cessation. Smokes a few daily.  06/09/11- 63 yoF current smoker with COPD complicated by DM, ASCVD, hx colon cancer, anemia., GERD, esophageal varices. Reports doing well. Discussed flu shot after her sore arm from the pneumovax, and decided they were different enough that there should not be a similar reaction noted with flu vax.  She has not had significant recent cough, wheeze, phlegm, chest pain or palpitation.   08/11/11-  57 yoF current smoker with COPD complicated by DM, ASCVD, hx colon cancer, anemia., GERD, esophageal varices. She described her problems with bladder infection and C. difficile colitis requiring antibiotics almost continuously for the last 5 months. Her breathing has not changed with weather and season. She gets some raspy cough occasionally but rarely produces sputum. She denies chest pain or palpitation. She is still smoking an occasional cigarette I believe, but much less than before.  10/13/11- 18 yoF current smoker with COPD complicated by DM, ASCVD, hx colon cancer, anemia., GERD,  esophageal varices. Had a viral bronchitis syndrome, largely resolved now.  12/11/11- 82 yoF current smoker with COPD complicated by DM, ASCVD, hx colon cancer, anemia., GERD, esophageal varices Pharmacist suggested she blunt stimulation from  xopenex neb solution by diluting with a little water. She isn't sure the neb makes that much difference. Asks refill Symbicort. Reviewed her complex medical problem list. Not having acute respiratory symptoms now.  02/09/12- 65 yoF former smoker with COPD complicated by DM, ASCVD, hx colon cancer, anemia., GERD, esophageal varices  Pt states breathing is the same, no better or worse since last visit  She assures me that she is not smoking. Breathing feels comfortable with little routine cough or wheeze. Uses Symbicort twice daily. She has not been using her nebulizer machine. CXR 08/27/11 reviewed w/ her IMPRESSION:  Emphysematous changes and mild left lower lobe opacity, likely  scarring or atelectasis. No acute cardiopulmonary process  identified.  Original Report Authenticated By: Waneta Martins, M.D.   03/31/12- ER visit [facility]: HPI  Patient is an 79 year old female with a history of multiple medical problems including COPD and continued smoking who presents today complaining of shortness of breath. Patient reports that this began last night. 2 days ago she was seen by her primary care provider and diagnosed with a urinary tract infection. She was started on Bactrim for this. Patient feels that her symptoms began after taking this medication at home last night. Initially the patient is noted to be wheezing she reports that she always wheezes. Patient has numerous drug allergies and feels that her symptoms are likely a drug reaction. Patient had fingerstick of 177 upon  arrival. She was afebrile and hemodynamically stable. She denied any chest pain and has no history of coronary artery disease despite all of her other medical problems. She denies  fevers but does endorse some cough. Patient has no history of DVT or PE. She denies history of heart failure. Patient is not currently on any anticoagulation for any reason. She continues to smoke 7-8 cigarettes a day. Exertion appears to make her shortness of breath worse and nothing makes it better. There are no other associated or modifying factors.    04/05/12- 84 yoF former smoker with COPD complicated by DM, ASCVD, hx colon cancer, anemia., GERD, esophageal varices At ER - see note above: COPD exacerb, UTI. Small LUL nodules seemed larger>CT.   Daughter Monique Fleming  here Unfortunately she has admitted still smoking a few cigarettes daily against all our counseling.  COPD assessment test (CAT) 23/40 Emergency room visit June 27 for COPD exacerbation and UTI. She understood chest x-ray showed "nodules". When she called about it we ordered CT. She still feels tight on a prednisone taper ending today. Was on sulfa for UTI- changed to keflex.  CT chest 04/05/12- reviewed images and report with them IMPRESSION:  1. No acute findings.  2. Emphysema.  3. No pathologic adenopathy within the mediastinum or hilar  regions.  4. Prominent coronary artery calcifications  5. Cirrhosis.  Original Report Authenticated By: Rosealee Albee, M.D.   05/16/12-82 yoF former smoker with COPD complicated by DM, ASCVD, hx colon cancer, anemia., GERD, esophageal varices Denies any SOB, wheezing, cough, or congestion at this time. COPD assessment test (CAT) 15/40 Definitely feels better than last visit   08/04/12- 82 yoF former smoker with COPD complicated by DM, ASCVD, hx colon cancer, anemia., GERD, esophageal varices Breathing about the same as last visit; had few nose bleeds last week. Epistaxis needed packing because of Plavix. COPD assessment test (CAT) score 22/40  10/06/12- 83 yoF former smoker with COPD complicated by DM, ASCVD, hx colon cancer, anemia., GERD, esophageal varices FOLLOWS FOR: slight wheezing,  SOB with activity mostly, cough slight-productive a times-white in color. She had quit smoking around June of 2013. Says she feels "pretty good" with no acute respiratory problems, no recent colds. Little change in chronic light cough with occasional white sputum. Denies chest pain, swollen glands, fever. Rarely needs her nebulizer. // cxr next visit?//  Review of Systems-See HPI Constitutional:   No weight loss, night sweats,  Fevers, chills,  +fatigue, lassitude. HEENT:   No headaches,  Difficulty swallowing,  Tooth/dental problems,  Sore throat,                No sneezing, itching, ear ache, nasal congestion, post nasal drip,  CV:  No chest pain, orthopnea, PND, swelling in lower extremities, anasarca, dizziness, palpitations GI  No heartburn, indigestion, abdominal pain, nausea, vomiting,  Resp: + shortness of breath with routine exertion or at rest.  No excess mucus,   No coughing up of blood.  No change in color of mucus.  Skin: no rash or lesions. GU: MS:  No joint pain or swelling.  Marland Kitchen Psych:  No change in mood or affect. + depression or anxiety.  No memory loss.  Objective:   Physical Exam BP 124/66  Pulse 72  Ht 5' 5.25" (1.657 m)  Wt 132 lb 3.2 oz (59.966 kg)  BMI 21.83 kg/m2  SpO2 97% room air General- Alert, Oriented, Affect-appropriate, Distress- none acute; calm  Skin- rash-none, lesions- none, excoriation- none. Dry.  Lymphadenopathy- none Head- atraumatic            Eyes- Gross vision intact, PERRLA, conjunctivae clear secretions            Ears- Hearing-diminished            Nose- Clear, no-Septal dev, mucus, polyps, erosion, perforation             Throat- Mallampati II , mucosa clear , drainage- none, tonsils- atrophic. Mild thrush Neck- flexible , trachea midline, no stridor , thyroid nl, carotid no bruit Chest - symmetrical excursion , unlabored           Heart/CV- RRR , 1-2/6 systolic precordial murmur AS , no gallop  , no rub, nl s1 s2                            - JVD- none , edema- none, stasis changes- none, varices- none           Lung-  +distant, slight cough, + trace crackles right base , dullness-none, rub- none. +Breathless speech pattern with long sentences seems chronic.           Chest wall-  Abd- Br/ Gen/ Rectal- Not done, not indicated Extrem- cyanosis- none, clubbing, none, atrophy- none, strength- nl Neuro- grossly intact to observation

## 2012-10-07 ENCOUNTER — Telehealth: Payer: Self-pay | Admitting: *Deleted

## 2012-10-07 NOTE — Telephone Encounter (Signed)
Notified pt of urine culture. Pt states she has completed abx x 2 days. Reports that symptoms are better but she continues to have intermittent frequency and dark urine.  Pt reports history of C. Diff in the past and is weary of taking further antibiotics if needed.   Please advise.    pls call pt and let her know that urine culture grew bacteria which was sensitive to macrodantin she was given.

## 2012-10-09 NOTE — Telephone Encounter (Signed)
She should complete abx and make sure that she is drinking enough water.  If urinary frequency is not resolved by the time that she completes abx, she should repeat urine culture.

## 2012-10-10 NOTE — Telephone Encounter (Signed)
Left message to return my call on home #. 

## 2012-10-13 ENCOUNTER — Ambulatory Visit (AMBULATORY_SURGERY_CENTER): Payer: Medicare Other | Admitting: Internal Medicine

## 2012-10-13 ENCOUNTER — Encounter: Payer: Self-pay | Admitting: Internal Medicine

## 2012-10-13 VITALS — BP 128/58 | HR 61 | Temp 96.4°F | Resp 12 | Ht 65.5 in | Wt 131.0 lb

## 2012-10-13 DIAGNOSIS — D509 Iron deficiency anemia, unspecified: Secondary | ICD-10-CM

## 2012-10-13 DIAGNOSIS — Z85038 Personal history of other malignant neoplasm of large intestine: Secondary | ICD-10-CM

## 2012-10-13 DIAGNOSIS — Z86010 Personal history of colonic polyps: Secondary | ICD-10-CM

## 2012-10-13 MED ORDER — SODIUM CHLORIDE 0.9 % IV SOLN: 500.0000 mL | INTRAVENOUS | Status: DC

## 2012-10-13 NOTE — Progress Notes (Signed)
1221 a/ox3 pleased with MAC report to [facility]

## 2012-10-13 NOTE — Progress Notes (Signed)
Patient did not experience any of the following events: a burn prior to discharge; a fall within the facility; wrong site/side/patient/procedure/implant event; or a hospital transfer or hospital admission upon discharge from the facility. (G8907) Patient did not have preoperative order for IV antibiotic SSI prophylaxis. (G8918)  

## 2012-10-13 NOTE — Patient Instructions (Addendum)
YOU HAD AN ENDOSCOPIC PROCEDURE TODAY AT THE [facility]: Refer to the procedure report that was given to you for any specific questions about what was found during the examination.  If the procedure report does not answer your questions, please call your gastroenterologist to clarify.  If you requested that your care partner not be given the details of your procedure findings, then the procedure report has been included in a sealed envelope for you to review at your convenience later.  YOU SHOULD EXPECT: Some feelings of bloating in the abdomen. Passage of more gas than usual.  Walking can help get rid of the air that was put into your GI tract during the procedure and reduce the bloating. If you had a lower endoscopy (such as a colonoscopy or flexible sigmoidoscopy) you may notice spotting of blood in your stool or on the toilet paper. If you underwent a bowel prep for your procedure, then you may not have a normal bowel movement for a few days.  DIET: Your first meal following the procedure should be a light meal and then it is ok to progress to your normal diet.  A half-sandwich or bowl of soup is an example of a good first meal.  Heavy or fried foods are harder to digest and may make you feel nauseous or bloated.  Likewise meals heavy in dairy and vegetables can cause extra gas to form and this can also increase the bloating.  Drink plenty of fluids but you should avoid alcoholic beverages for 24 hours.  ACTIVITY: Your care partner should take you home directly after the procedure.  You should plan to take it easy, moving slowly for the rest of the day.  You can resume normal activity the day after the procedure however you should NOT DRIVE or use heavy machinery for 24 hours (because of the sedation medicines used during the test).    SYMPTOMS TO REPORT IMMEDIATELY: A gastroenterologist can be reached at any hour.  During normal business hours, 8:30 AM to 5:00 PM Monday through Friday,  call [phone].  After hours and on weekends, please call the GI answering service at [phone] who will take a message and have the physician on call contact you.   Following lower endoscopy (colonoscopy or flexible sigmoidoscopy):  Excessive amounts of blood in the stool  Significant tenderness or worsening of abdominal pains  Swelling of the abdomen that is new, acute  Fever of 100F or higher  FOLLOW UP: If any biopsies were taken you will be contacted by phone or by letter within the next 1-3 weeks.  Call your gastroenterologist if you have not heard about the biopsies in 3 weeks.  Our staff will call the home number listed on your records the next business day following your procedure to check on you and address any questions or concerns that you may have at that time regarding the information given to you following your procedure. This is a courtesy call and so if there is no answer at the home number and we have not heard from you through the emergency physician on call, we will assume that you have returned to your regular daily activities without incident.  SIGNATURES/CONFIDENTIALITY: You and/or your care partner have signed paperwork which will be entered into your electronic medical record.  These signatures attest to the fact that that the information above on your After Visit Summary has been reviewed and is understood.  Full responsibility of the confidentiality of this   discharge information lies with you and/or your care-partner.  Resume medications. 

## 2012-10-13 NOTE — Op Note (Signed)
[facility] [address] [place], [address]   COLONOSCOPY PROCEDURE REPORT  PATIENT: Monique Fleming, Monique Fleming  MR#: [id number] BIRTHDATE: 04/30/1929 , 83  yrs. old GENDER: Female ENDOSCOPIST: Roxy Cedar, MD REFERRED BY:.  Self / Office PROCEDURE DATE:  10/13/2012 PROCEDURE:   Colonoscopy, diagnostic ASA CLASS:   Class III INDICATIONS:Iron Deficiency Anemia. MEDICATIONS: MAC sedation, administered by CRNA and propofol (Diprivan) 130mg  IV  DESCRIPTION OF PROCEDURE:   After the risks benefits and alternatives of the procedure were thoroughly explained, informed consent was obtained.  A digital rectal exam revealed no abnormalities of the rectum.   The LB [id number]  endoscope was introduced through the anus and advanced to the surgical anastomosis. No adverse events experienced.   The quality of the prep was excellent, using MoviPrep  The instrument was then slowly withdrawn as the colon was fully examined.      COLON FINDINGS: A angiodysplastic lesions were found The finding was in the right colon.   The colon mucosa was otherwise normal. Retroflexed views revealed internal hemorrhoids and rectal varices. The time to cecum=3 minutes 12 seconds.  Withdrawal time=4 minutes 31 seconds.  The scope was withdrawn and the procedure completed. COMPLICATIONS: There were no complications.  ENDOSCOPIC IMPRESSION: 1.   Angiodysplastic lesions and in the right colon 2.   The colon mucosa was otherwise normal S/P right hemicolectomy  RECOMMENDATIONS: 1. Stay on Iron indefinitely 2. Return to the care of your primary provider.  GI follow up as needed   eSigned:  Roxy Cedar, MD 10/13/2012 12:20 PM   cc: Charlynn Court MD and The Patient

## 2012-10-14 ENCOUNTER — Telehealth: Payer: Self-pay | Admitting: *Deleted

## 2012-10-14 NOTE — Telephone Encounter (Signed)
  Follow up Call-  Call back number 10/13/2012 11/19/2011  Post procedure Call Back phone  # [phone]  Permission to leave phone message Yes No     Patient questions:  Do you have a fever, pain , or abdominal swelling? no Pain Score  0 *  Have you tolerated food without any problems? yes  Have you been able to return to your normal activities? yes  Do you have any questions about your discharge instructions: Diet   no Medications  no Follow up visit  no  Do you have questions or concerns about your Care? no  Actions: * If pain score is 4 or above: No action needed, pain <4.

## 2012-10-16 NOTE — Assessment & Plan Note (Signed)
Breath sounds have been clearer since she stopped smoking now almost 6 months ago. There is little wheeze and no change in exercise tolerance. Dyspnea on exertion will be a function of coronary disease, aortic stenosis, anemia as well as her lung disease. This seems stable and not much limiting her conservative lifestyle. Looking at her today, I don't think I would help her much by getting PFT. Consider chest x-ray on next visit.

## 2012-10-17 NOTE — Telephone Encounter (Signed)
Spoke with pt. She states urinary symptoms have resolved.

## 2012-10-18 ENCOUNTER — Telehealth: Payer: Self-pay | Admitting: Hematology & Oncology

## 2012-10-18 NOTE — Telephone Encounter (Signed)
Patient called and resch 09/19/12 missed apt for 10/21/12

## 2012-10-20 ENCOUNTER — Ambulatory Visit (HOSPITAL_BASED_OUTPATIENT_CLINIC_OR_DEPARTMENT_OTHER): Payer: Medicare Other | Admitting: Medical

## 2012-10-20 ENCOUNTER — Other Ambulatory Visit (HOSPITAL_BASED_OUTPATIENT_CLINIC_OR_DEPARTMENT_OTHER): Payer: Medicare Other | Admitting: Lab

## 2012-10-20 VITALS — BP 154/78 | HR 82 | Temp 97.6°F | Resp 28 | Wt 154.0 lb

## 2012-10-20 DIAGNOSIS — D509 Iron deficiency anemia, unspecified: Secondary | ICD-10-CM

## 2012-10-20 DIAGNOSIS — Z85038 Personal history of other malignant neoplasm of large intestine: Secondary | ICD-10-CM

## 2012-10-20 LAB — CHCC SATELLITE - SMEAR

## 2012-10-20 LAB — CBC WITH DIFFERENTIAL (CANCER CENTER ONLY)
BASO%: 0.5 % (ref 0.0–2.0)
EOS%: 3.6 % (ref 0.0–7.0)
HCT: 39.8 % (ref 34.8–46.6)
LYMPH#: 1.7 10*3/uL (ref 0.9–3.3)
MONO#: 0.6 10*3/uL (ref 0.1–0.9)
NEUT#: 3.7 10*3/uL (ref 1.5–6.5)
NEUT%: 59.9 % (ref 39.6–80.0)
RDW: 20.3 % — ABNORMAL HIGH (ref 11.1–15.7)
WBC: 6.2 10*3/uL (ref 3.9–10.0)

## 2012-10-20 LAB — IRON AND TIBC
Iron: 48 ug/dL (ref 42–145)
UIBC: 247 ug/dL (ref 125–400)

## 2012-10-20 LAB — FERRITIN: Ferritin: 34 ng/mL (ref 10–291)

## 2012-10-20 NOTE — Progress Notes (Signed)
Diagnoses: #1 stage I (T1, N2, M0)) adenocarcinoma of the right colon. #2.  Iron deficiency anemia.  Interim history: Monique Fleming presents today for an office followup visit.  The last, time, we saw her was back in April of 2013.  At that time, she had gone through her 5 year.  Followup from her stage I adenocarcinoma of the colon.  She did not have any recurrent disease.  At that time, we decided to go ahead and discharge, her from the practice, but would be glad to see her back, at any time.  Apparently, her hemoglobin has been dropping, and she was noticing some blood in her stool, intermittently.  She did have a colonoscopy, done on 10/13/2012, which revealed an angiodysplastic lesion in the right colon, otherwise, normal.  She is status post a right hemicolectomy.  It was recommended that she stay on iron indefinitely, and continue to followup with her primary care provider, as well as a gastroenterologist, if needed.  Per her report.  She was told that if she ever needed.  IV iron.  She would have to be seen by [place] again.  She has been on oral iron supplementation.  Her hemoglobin has come up from 9.3.  Back in November to 11.2, on 09/15/2012.  Her hemoglobin actually looks really good today, it is 12.7, with hematocrit 39.8, MCV of 81.  She is obviously absorbing the by mouth iron.  We are getting an iron panel on her today.  She does not report any strange cravings.  She is eating good.  She denies any nausea, vomiting, diarrhea, constipation, chest pain, shortness of breath, or cough.  She denies any fevers, chills, or night sweats.  She denies any abnormal bowel movements.  She denies any headaches, visual changes, or rashes.  She denies any abdominal pain.  She denies any lower leg swelling.  It does look like she had a small AVM with her colostomy.  I do suppose slowly she could be leaking some blood.  It also, look like she has some internal hemorrhoids.  She does report intermittent, blood in her  stools, but feels it's from her hemorrhoids.  No malignancy was identified.  Back in February, 2013.  She did have an upper endoscopy, which revealed mild portal gastropathy, but no other abnormalities.  Review of Systems: Constitutional:Negative for malaise/fatigue, fever, chills, weight loss, diaphoresis, activity change, appetite change, and unexpected weight change.  HEENT: Negative for double vision, blurred vision, visual loss, ear pain, tinnitus, congestion, rhinorrhea, epistaxis sore throat or sinus disease, oral pain/lesion, tongue soreness Respiratory: Negative for cough, chest tightness, shortness of breath, wheezing and stridor.  Cardiovascular: Negative for chest pain, palpitations, leg swelling, orthopnea, PND, DOE or claudication Gastrointestinal: Negative for nausea, vomiting, abdominal pain, diarrhea, constipation, blood in stool, melena, hematochezia, abdominal distention, anal bleeding, rectal pain, anorexia and hematemesis.  Genitourinary: Negative for dysuria, frequency, hematuria,  Musculoskeletal: Negative for myalgias, back pain, joint swelling, arthralgias and gait problem.  Skin: Negative for rash, color change, pallor and wound.  Neurological:. Negative for dizziness/light-headedness, tremors, seizures, syncope, facial asymmetry, speech difficulty, weakness, numbness, headaches and paresthesias.  Hematological: Negative for adenopathy. Does not bruise/bleed easily.  Psychiatric/Behavioral:  Negative for depression, no loss of interest in normal activity or change in sleep pattern.   Physical Exam: This is an elderly, 79 year old, white female, in no obvious distress Vitals: Temperature 97.6 degrees, pulse 82, respirations 28, blood pressure 154/78, weight 154 pounds HEENT reveals a normocephalic, atraumatic skull, no  scleral icterus, no oral lesions  Neck is supple without any cervical or supraclavicular adenopathy.  Lungs are clear to auscultation bilaterally. There are  no wheezes, rales or rhonci Cardiac is regular rate and rhythm with a normal S1 and S2. There are no murmurs, rubs, or bruits.  Abdomen is soft with good bowel sounds, there is no palpable mass. There is no palpable hepatosplenomegaly. There is no palpable fluid wave.  Musculoskeletal no tenderness of the spine, ribs, or hips.  Extremities there are no clubbing, cyanosis, or edema.  Skin no petechia, purpura or ecchymosis Neurologic is nonfocal.  Laboratory Data: White count 6.2, hemoglobin 12.7, hematocrit 39.8, MCV 81, platelet 138,000  Current Outpatient Prescriptions on File Prior to Visit  Medication Sig Dispense Refill  . albuterol (PROVENTIL) (2.5 MG/3ML) 0.083% nebulizer solution Take 2.5 mg by nebulization every 6 (six) hours as needed.      . budesonide-formoterol (SYMBICORT) 80-4.5 MCG/ACT inhaler Inhale 2 puffs into the lungs 2 (two) times daily. Rinse mouth  3 Inhaler  3  . Cholecalciferol (VITAMIN D3) 1000 UNITS tablet Take 1,000 Units by mouth daily.        . cholestyramine light (PREVALITE) 4 GM/DOSE powder Take by mouth as needed.      . clonazePAM (KLONOPIN) 0.5 MG tablet Take on tablet in the morning and up to 1 and 1/2 tablet (1.5  total) at bedtime as needed For sleep  75 tablet  1  . clopidogrel (PLAVIX) 75 MG tablet Take 1 tablet (75 mg total) by mouth daily.  90 tablet  3  . Cyanocobalamin (VITAMIN B-12) 500 MCG SUBL Place 1 tablet (500 mcg total) under the tongue 1 day or 1 dose.  100 tablet  3  . esomeprazole (NEXIUM) 40 MG capsule Take 40 mg by mouth 2 (two) times daily. Patient uses this medication for acid reflux.      . ferrous sulfate 325 (65 FE) MG tablet Take 325 mg by mouth daily with breakfast.      . glimepiride (AMARYL) 1 MG tablet Take 1 mg by mouth 2 (two) times daily.       Marland Kitchen glucose blood test strip Use as instructed  100 each  3  . nitrofurantoin (MACRODANTIN) 50 MG capsule Take 1 capsule (50 mg total) by mouth 4 (four) times daily.  28 capsule  0  .  phenytoin (DILANTIN) 100 MG ER capsule Take 100 mg by mouth daily. Take 100 mg every  Morning and 200 mg at bedtime      . potassium chloride (KLOR-CON) 10 MEQ CR tablet Take 20 mEq by mouth 2 (two) times daily.       Assessment/Plan: This is a pleasant, 79 year old, white female, with the following issues:  #1.  Iron deficiency anemia.  Her hemoglobin has significantly, up from 9.3, to 12.7.  She is obviously absorbing the by mouth iron.  At this time, I do not feel that she needs IV iron.  She will continue on her oral iron supplementation.  We are getting an iron panel on her.  At this time, I, think it's okay if we go ahead and release her, however, if in the near future we need to see her back for anything we will be more than glad to.  #2.  History of stage I adenocarcinoma of the right colon.  She underwent resection back in  May 2008.  She has gotten through her 5 years of followup.  She is asymptomatic.  There is  no evidence of any relapse or recurrence.  She did have a recent colonoscopy on 10/13/2012 that did not reveal any malignancy.

## 2012-10-21 ENCOUNTER — Ambulatory Visit: Payer: Medicare Other | Admitting: Medical

## 2012-10-21 ENCOUNTER — Other Ambulatory Visit: Payer: Medicare Other | Admitting: Lab

## 2012-12-15 ENCOUNTER — Telehealth: Payer: Self-pay | Admitting: Internal Medicine

## 2012-12-15 MED ORDER — FERROUS SULFATE 325 (65 FE) MG PO TABS: 325.0000 mg | ORAL_TABLET | Freq: Every day | ORAL | Status: DC

## 2012-12-15 NOTE — Telephone Encounter (Signed)
Rx to pharmacy/SLS 

## 2012-12-15 NOTE — Telephone Encounter (Signed)
Refill- phenytoin sodium 100mg  cer. Take one tablet daily in the morning and take two capsules by mouth at bedtime daily.

## 2012-12-15 NOTE — Telephone Encounter (Signed)
Refill- ferrous sulfate 325mg  tab. Take one tablet three times each day. Qty 90 last fill 11.11.13

## 2012-12-16 MED ORDER — PHENYTOIN SODIUM EXTENDED 100 MG PO CAPS: 100.0000 mg | ORAL_CAPSULE | Freq: Every day | ORAL | Status: DC

## 2012-12-16 NOTE — Telephone Encounter (Signed)
Dilantin request [last Rx 10.05.2012]/SLS

## 2012-12-16 NOTE — Telephone Encounter (Signed)
Done via Josph Macho in chart 03.14.14 #90x1/SLS

## 2013-01-23 ENCOUNTER — Encounter: Payer: Self-pay | Admitting: Family Medicine

## 2013-01-23 ENCOUNTER — Ambulatory Visit (INDEPENDENT_AMBULATORY_CARE_PROVIDER_SITE_OTHER): Payer: Medicare Other | Admitting: Family Medicine

## 2013-01-23 VITALS — BP 140/60 | HR 76 | Temp 98.1°F | Ht 65.5 in | Wt 128.8 lb

## 2013-01-23 DIAGNOSIS — D509 Iron deficiency anemia, unspecified: Secondary | ICD-10-CM

## 2013-01-23 DIAGNOSIS — J329 Chronic sinusitis, unspecified: Secondary | ICD-10-CM

## 2013-01-23 DIAGNOSIS — R5383 Other fatigue: Secondary | ICD-10-CM

## 2013-01-23 DIAGNOSIS — N390 Urinary tract infection, site not specified: Secondary | ICD-10-CM

## 2013-01-23 DIAGNOSIS — D649 Anemia, unspecified: Secondary | ICD-10-CM

## 2013-01-23 DIAGNOSIS — G40309 Generalized idiopathic epilepsy and epileptic syndromes, not intractable, without status epilepticus: Secondary | ICD-10-CM

## 2013-01-23 DIAGNOSIS — E119 Type 2 diabetes mellitus without complications: Secondary | ICD-10-CM

## 2013-01-23 DIAGNOSIS — R569 Unspecified convulsions: Secondary | ICD-10-CM

## 2013-01-23 LAB — HEPATIC FUNCTION PANEL
ALT: 9 U/L (ref 0–35)
Bilirubin, Direct: 0.1 mg/dL (ref 0.0–0.3)
Total Bilirubin: 0.5 mg/dL (ref 0.3–1.2)

## 2013-01-23 LAB — RENAL FUNCTION PANEL
Albumin: 3.6 g/dL (ref 3.5–5.2)
Calcium: 9 mg/dL (ref 8.4–10.5)
Glucose, Bld: 133 mg/dL — ABNORMAL HIGH (ref 70–99)
Phosphorus: 3.4 mg/dL (ref 2.3–4.6)
Potassium: 4.1 mEq/L (ref 3.5–5.3)
Sodium: 143 mEq/L (ref 135–145)

## 2013-01-23 LAB — CBC
HCT: 36.9 % (ref 36.0–46.0)
MCH: 28.6 pg (ref 26.0–34.0)
MCHC: 34.1 g/dL (ref 30.0–36.0)
MCV: 83.9 fL (ref 78.0–100.0)
Platelets: 137 10*3/uL — ABNORMAL LOW (ref 150–400)
RDW: 16 % — ABNORMAL HIGH (ref 11.5–15.5)
WBC: 6.2 10*3/uL (ref 4.0–10.5)

## 2013-01-23 LAB — HEMOGLOBIN A1C
Hgb A1c MFr Bld: 5.9 % — ABNORMAL HIGH (ref <5.7)
Mean Plasma Glucose: 123 mg/dL — ABNORMAL HIGH (ref <117)

## 2013-01-23 MED ORDER — DOXYCYCLINE HYCLATE 100 MG PO TABS: 100.0000 mg | ORAL_TABLET | Freq: Two times a day (BID) | ORAL | Status: DC

## 2013-01-23 NOTE — Assessment & Plan Note (Signed)
Check a hgba1c today. Needs small, frequent meals with lean proteins

## 2013-01-23 NOTE — Assessment & Plan Note (Signed)
Recurrent episodes

## 2013-01-23 NOTE — Patient Instructions (Addendum)
  Start a probiotic such as Digestive Advantage or Align daily  Urinary Tract Infection Urinary tract infections (UTIs) can develop anywhere along your urinary tract. Your urinary tract is your body's drainage system for removing wastes and extra water. Your urinary tract includes two kidneys, two ureters, a bladder, and a urethra. Your kidneys are a pair of bean-shaped organs. Each kidney is about the size of your fist. They are located below your ribs, one on each side of your spine. CAUSES Infections are caused by microbes, which are microscopic organisms, including fungi, viruses, and bacteria. These organisms are so small that they can only be seen through a microscope. Bacteria are the microbes that most commonly cause UTIs. SYMPTOMS  Symptoms of UTIs may vary by age and gender of the patient and by the location of the infection. Symptoms in young women typically include a frequent and intense urge to urinate and a painful, burning feeling in the bladder or urethra during urination. Older women and men are more likely to be tired, shaky, and weak and have muscle aches and abdominal pain. A fever may mean the infection is in your kidneys. Other symptoms of a kidney infection include pain in your back or sides below the ribs, nausea, and vomiting. DIAGNOSIS To diagnose a UTI, your caregiver will ask you about your symptoms. Your caregiver also will ask to provide a urine sample. The urine sample will be tested for bacteria and white blood cells. White blood cells are made by your body to help fight infection. TREATMENT  Typically, UTIs can be treated with medication. Because most UTIs are caused by a bacterial infection, they usually can be treated with the use of antibiotics. The choice of antibiotic and length of treatment depend on your symptoms and the type of bacteria causing your infection. HOME CARE INSTRUCTIONS  If you were prescribed antibiotics, take them exactly as your caregiver  instructs you. Finish the medication even if you feel better after you have only taken some of the medication.  Drink enough water and fluids to keep your urine clear or pale yellow.  Avoid caffeine, tea, and carbonated beverages. They tend to irritate your bladder.  Empty your bladder often. Avoid holding urine for long periods of time.  Empty your bladder before and after sexual intercourse.  After a bowel movement, women should cleanse from front to back. Use each tissue only once. SEEK MEDICAL CARE IF:   You have back pain.  You develop a fever.  Your symptoms do not begin to resolve within 3 days. SEEK IMMEDIATE MEDICAL CARE IF:   You have severe back pain or lower abdominal pain.  You develop chills.  You have nausea or vomiting.  You have continued burning or discomfort with urination. MAKE SURE YOU:   Understand these instructions.  Will watch your condition.  Will get help right away if you are not doing well or get worse. Document Released: 07/01/2005 Document Revised: 03/22/2012 Document Reviewed: 10/30/2011 [facility] Patient Information 2013 [place], [place].

## 2013-01-23 NOTE — Assessment & Plan Note (Signed)
Reports only one episode in past. Will check Dilantin level today

## 2013-01-24 ENCOUNTER — Telehealth: Payer: Self-pay | Admitting: Family Medicine

## 2013-01-24 LAB — URINALYSIS
Glucose, UA: NEGATIVE mg/dL
Leukocytes, UA: NEGATIVE
Protein, ur: NEGATIVE mg/dL
Specific Gravity, Urine: 1.01 (ref 1.005–1.030)

## 2013-01-24 NOTE — Telephone Encounter (Signed)
See lab result notes

## 2013-01-24 NOTE — Progress Notes (Signed)
Quick Note:  Patient Informed and voiced understanding ______ 

## 2013-01-24 NOTE — Assessment & Plan Note (Signed)
Dilantin level low but no seizure activity in years so no concerns identified.

## 2013-01-24 NOTE — Assessment & Plan Note (Signed)
Follows with Dr Myna Hidalgo routinely

## 2013-01-24 NOTE — Telephone Encounter (Signed)
Patient is requesting last lab results 

## 2013-01-25 ENCOUNTER — Encounter: Payer: Self-pay | Admitting: Family Medicine

## 2013-01-25 NOTE — Assessment & Plan Note (Signed)
No obvious cause in labs today

## 2013-01-25 NOTE — Progress Notes (Signed)
Patient ID: Monique Fleming, female   DOB: [birth year], 79 y.o.   MRN: [id number] ORPAH HAUSNER [id number] Feb 10, 1929 01/25/2013      Progress Note-Follow Up  Subjective  Chief Complaint  Chief Complaint  Patient presents with  . Follow-up    HPI  Patient is an 79 year old Caucasian female who is in today for followup and is complaining of feeling somewhat weaker than usual over the last 2-3 weeks. In the past sulcus when she often has had worsening anemia or low potassium. She denies any recent major illness but has had some mild cold intolerance recently. No fevers. No headaches or neurologic complaints. No chest pain or palpitations. No increasing shortness or breath GI or GU concerns noted today.  Past Medical History  Diagnosis Date  . Aortic stenosis     moderate by echo 6/11 gradient 20/65mmHg for mean and peak  . TIA (transient ischemic attack)     Dr. Pearlean Brownie  . Peripheral vascular disease   . IBS (irritable bowel syndrome)   . Diabetes mellitus     type II  . Chronic bronchitis   . COPD (chronic obstructive pulmonary disease)   . Atherosclerosis   . Depression   . Anxiety     Dr Donell Beers  . Renal cyst   . Anemia     iron deficiency  . Cellulitis   . Diverticular disease   . Infectious diarrhea   . Sinusitis   . Thrush   . Abnormal liver function test   . GERD (gastroesophageal reflux disease)   . Insomnia     chronic  . Adenocarcinoma 2008    colon,s/p right hemicolectomy  . Angina   . Seizures     hx of one seizure "  . Cirrhosis of liver     hx of  . Clostridium difficile diarrhea     Past Surgical History  Procedure Laterality Date  . Carotid endarterectomy    . Colectomy      right hemi  . Cholecystectomy  11/09    Rt Breast  . Upper gastrointestinal endoscopy      Family History  Problem Relation Age of Onset  . Colon cancer Paternal Grandmother   . Esophageal cancer Brother   . Heart disease Mother   . Diabetes Brother   .  Hypertension Father   . Rectal cancer Neg Hx   . Stomach cancer Neg Hx     History   Social History  . Marital Status: Widowed    Spouse Name: N/A    Number of Children: N/A  . Years of Education: N/A   Occupational History  . retired    Social History Main Topics  . Smoking status: Former Smoker -- 0.50 packs/day    Types: Cigarettes    Quit date: 03/29/2012  . Smokeless tobacco: Current User     Comment: Using Electronic cigarette  . Alcohol Use: No  . Drug Use: No  . Sexually Active: No   Other Topics Concern  . Not on file   Social History Narrative   Patient signed a Designated Party Release to allow her daughter, Maiko Salais, to have access to her medical records/information. Entered by Daphane Shepherd March 24,2011 @ 2:47 pm   Dailly Caffeine Use    Current Outpatient Prescriptions on File Prior to Visit  Medication Sig Dispense Refill  . albuterol (PROVENTIL) (2.5 MG/3ML) 0.083% nebulizer solution Take 2.5 mg by nebulization every 6 (six) hours as needed.      Marland Kitchen  Cholecalciferol (VITAMIN D3) 1000 UNITS tablet Take 1,000 Units by mouth daily.        . cholestyramine light (PREVALITE) 4 GM/DOSE powder Take by mouth as needed.      . clonazePAM (KLONOPIN) 0.5 MG tablet Take on tablet in the morning and up to 1 and 1/2 tablet (1.5  total) at bedtime as needed For sleep  75 tablet  1  . clopidogrel (PLAVIX) 75 MG tablet Take 1 tablet (75 mg total) by mouth daily.  90 tablet  3  . Cyanocobalamin (VITAMIN B-12) 500 MCG SUBL Place 1 tablet (500 mcg total) under the tongue 1 day or 1 dose.  100 tablet  3  . esomeprazole (NEXIUM) 40 MG capsule Take 40 mg by mouth 2 (two) times daily. Patient uses this medication for acid reflux.      . ferrous sulfate 325 (65 FE) MG tablet Take 1 tablet (325 mg total) by mouth daily with breakfast.  30 tablet  1  . glimepiride (AMARYL) 1 MG tablet Take 1 mg by mouth 2 (two) times daily.       Marland Kitchen glucose blood test strip Use as instructed  100  each  3  . nitrofurantoin (MACRODANTIN) 50 MG capsule Take 1 capsule (50 mg total) by mouth 4 (four) times daily.  28 capsule  0  . phenytoin (DILANTIN) 100 MG ER capsule Take 1 capsule (100 mg total) by mouth daily. Take 100 mg every  Morning and 200 mg at bedtime  90 capsule  1  . potassium chloride (KLOR-CON) 10 MEQ CR tablet Take 20 mEq by mouth 2 (two) times daily.      . budesonide-formoterol (SYMBICORT) 80-4.5 MCG/ACT inhaler Inhale 2 puffs into the lungs 2 (two) times daily. Rinse mouth  3 Inhaler  3   No current facility-administered medications on file prior to visit.    Allergies  Allergen Reactions  . Bactrim (Sulfamethoxazole W-Trimethoprim) Shortness Of Breath  . Levofloxacin Other (See Comments)    seizure  . Pneumovax (Pneumococcal Polysaccharides) Other (See Comments)    Severe allergy-cellulitis  . Amoxicillin-Pot Clavulanate Other (See Comments)    Patient stated Drs. recommend taking Augmentin due to her PCN allergy.  . Penicillins Rash    unknown  . Haldol (Haloperidol Lactate)     Had unknown reaction many yrs ago per pt  . Morphine And Related     Confused and agitation  . Sudafed (Pseudoephedrine Hcl) Other (See Comments)    seizure    Review of Systems  Review of Systems  Constitutional: Positive for malaise/fatigue. Negative for fever.  HENT: Negative for congestion.   Eyes: Negative for discharge.  Respiratory: Negative for shortness of breath.   Cardiovascular: Negative for chest pain, palpitations and leg swelling.  Gastrointestinal: Negative for nausea, abdominal pain and diarrhea.  Genitourinary: Negative for dysuria.  Musculoskeletal: Negative for falls.  Skin: Negative for rash.  Neurological: Negative for loss of consciousness and headaches.  Endo/Heme/Allergies: Negative for polydipsia.  Psychiatric/Behavioral: Negative for depression and suicidal ideas. The patient is not nervous/anxious and does not have insomnia.     Objective  BP  140/60  Pulse 76  Temp(Src) 98.1 F (36.7 C) (Oral)  Ht 5' 5.5" (1.664 m)  Wt 128 lb 12 oz (58.401 kg)  BMI 21.09 kg/m2  SpO2 95%  Physical Exam  Physical Exam  Constitutional: She is oriented to person, place, and time and well-developed, well-nourished, and in no distress. No distress.  HENT:  Head: Normocephalic  and atraumatic.  Eyes: Conjunctivae are normal.  Neck: Neck supple. No thyromegaly present.  Cardiovascular: Normal rate, regular rhythm and normal heart sounds.   Pulmonary/Chest: Effort normal and breath sounds normal. She has no wheezes.  Abdominal: She exhibits no distension and no mass.  Musculoskeletal: She exhibits no edema.  Lymphadenopathy:    She has no cervical adenopathy.  Neurological: She is alert and oriented to person, place, and time.  Skin: Skin is warm and dry. No rash noted. She is not diaphoretic.  Psychiatric: Memory, affect and judgment normal.    Lab Results  Component Value Date   TSH 2.54 10/20/2011   Lab Results  Component Value Date   WBC 6.2 01/23/2013   HGB 12.6 01/23/2013   HCT 36.9 01/23/2013   MCV 83.9 01/23/2013   PLT 137* 01/23/2013   Lab Results  Component Value Date   CREATININE 0.89 01/23/2013   BUN 9 01/23/2013   NA 143 01/23/2013   K 4.1 01/23/2013   CL 109 01/23/2013   CO2 27 01/23/2013   Lab Results  Component Value Date   ALT 9 01/23/2013   AST 27 01/23/2013   ALKPHOS 101 01/23/2013   BILITOT 0.5 01/23/2013   Lab Results  Component Value Date   CHOL 130 08/10/2012   Lab Results  Component Value Date   HDL 22* 08/10/2012   Lab Results  Component Value Date   LDLCALC 76 08/10/2012   Lab Results  Component Value Date   TRIG 159* 08/10/2012   Lab Results  Component Value Date   CHOLHDL 5.9 08/10/2012     Assessment & Plan  DIABETES MELLITUS, TYPE II Check a hgba1c today. Needs small, frequent meals with lean proteins  GRAND MAL SEIZURE Reports only one episode in past. Will check Dilantin level  today  UTI (lower urinary tract infection) Recurrent episodes  ANEMIA, IRON DEFICIENCY Follows with Dr Myna Hidalgo routinely  SEIZURE DISORDER Dilantin level low but no seizure activity in years so no concerns identified.   FATIGUE No obvious cause in labs today

## 2013-02-03 ENCOUNTER — Ambulatory Visit (INDEPENDENT_AMBULATORY_CARE_PROVIDER_SITE_OTHER)
Admission: RE | Admit: 2013-02-03 | Discharge: 2013-02-03 | Disposition: A | Discharge status: Home / Self Care | Payer: Medicare Other | Source: Ambulatory Visit | Attending: Internal Medicine | Admitting: Internal Medicine

## 2013-02-03 ENCOUNTER — Encounter: Payer: Self-pay | Admitting: Internal Medicine

## 2013-02-03 ENCOUNTER — Ambulatory Visit (INDEPENDENT_AMBULATORY_CARE_PROVIDER_SITE_OTHER): Payer: Medicare Other | Admitting: Internal Medicine

## 2013-02-03 VITALS — BP 136/80 | HR 77 | Ht 64.25 in | Wt 130.4 lb

## 2013-02-03 DIAGNOSIS — J4489 Other specified chronic obstructive pulmonary disease: Secondary | ICD-10-CM

## 2013-02-03 DIAGNOSIS — J449 Chronic obstructive pulmonary disease, unspecified: Secondary | ICD-10-CM

## 2013-02-03 DIAGNOSIS — I350 Nonrheumatic aortic (valve) stenosis: Secondary | ICD-10-CM

## 2013-02-03 DIAGNOSIS — J42 Unspecified chronic bronchitis: Secondary | ICD-10-CM

## 2013-02-03 DIAGNOSIS — I359 Nonrheumatic aortic valve disorder, unspecified: Secondary | ICD-10-CM

## 2013-02-03 DIAGNOSIS — F17200 Nicotine dependence, unspecified, uncomplicated: Secondary | ICD-10-CM

## 2013-02-03 MED ORDER — BUDESONIDE-FORMOTEROL FUMARATE 80-4.5 MCG/ACT IN AERO: 2.0000 | INHALATION_SPRAY | Freq: Two times a day (BID) | RESPIRATORY_TRACT | Status: DC

## 2013-02-03 NOTE — Progress Notes (Signed)
Patient ID: Monique Fleming, female    DOB: Nov 09, 1928, 79 y.o.   MRN: [id number]  HPI 52 yoF former smoker with COPD and multiple medical problems.  Last here November 05, 2010. Since then she reports staying off cigarettes and having no significant respiratory events. Pollen bothered more in March with nasal congestion, postnasal drip, but not much wheeze or cough. She feels current meds are adequate.  04/07/11- 81 yoF current smoker with COPD complicated by DM, ASCVD, hx colon cancer, anemia., GERD, esophageal varices. Was hosp for red, inflamed arm after Pneumovax with concern then that she had cellulits. Has had UTI then yeast from antibiotics- to see her GYN Has indigestion with known liver disease and esophageal varices.  Doesn't describe active respiratory complaints- mild occasional cough, dyspnea with exertion unchanged. We talked again about smoking and tried to motivate to full cessation. Smokes a few daily.  06/09/11- 59 yoF current smoker with COPD complicated by DM, ASCVD, hx colon cancer, anemia., GERD, esophageal varices. Reports doing well. Discussed flu shot after her sore arm from the pneumovax, and decided they were different enough that there should not be a similar reaction noted with flu vax.  She has not had significant recent cough, wheeze, phlegm, chest pain or palpitation.   08/11/11-  54 yoF current smoker with COPD complicated by DM, ASCVD, hx colon cancer, anemia., GERD, esophageal varices. She described her problems with bladder infection and C. difficile colitis requiring antibiotics almost continuously for the last 5 months. Her breathing has not changed with weather and season. She gets some raspy cough occasionally but rarely produces sputum. She denies chest pain or palpitation. She is still smoking an occasional cigarette I believe, but much less than before.  10/13/11- 78 yoF current smoker with COPD complicated by DM, ASCVD, hx colon cancer, anemia., GERD,  esophageal varices. Had a viral bronchitis syndrome, largely resolved now.  12/11/11- 82 yoF current smoker with COPD complicated by DM, ASCVD, hx colon cancer, anemia., GERD, esophageal varices Pharmacist suggested she blunt stimulation from  xopenex neb solution by diluting with a little water. She isn't sure the neb makes that much difference. Asks refill Symbicort. Reviewed her complex medical problem list. Not having acute respiratory symptoms now.  02/09/12- 71 yoF former smoker with COPD complicated by DM, ASCVD, hx colon cancer, anemia., GERD, esophageal varices  Pt states breathing is the same, no better or worse since last visit  She assures me that she is not smoking. Breathing feels comfortable with little routine cough or wheeze. Uses Symbicort twice daily. She has not been using her nebulizer machine. CXR 08/27/11 reviewed w/ her IMPRESSION:  Emphysematous changes and mild left lower lobe opacity, likely  scarring or atelectasis. No acute cardiopulmonary process  identified.  Original Report Authenticated By: Waneta Martins, M.D.   03/31/12- ER visit [facility]: HPI  Patient is an 79 year old female with a history of multiple medical problems including COPD and continued smoking who presents today complaining of shortness of breath. Patient reports that this began last night. 2 days ago she was seen by her primary care provider and diagnosed with a urinary tract infection. She was started on Bactrim for this. Patient feels that her symptoms began after taking this medication at home last night. Initially the patient is noted to be wheezing she reports that she always wheezes. Patient has numerous drug allergies and feels that her symptoms are likely a drug reaction. Patient had fingerstick of 177 upon  arrival. She was afebrile and hemodynamically stable. She denied any chest pain and has no history of coronary artery disease despite all of her other medical problems. She denies  fevers but does endorse some cough. Patient has no history of DVT or PE. She denies history of heart failure. Patient is not currently on any anticoagulation for any reason. She continues to smoke 7-8 cigarettes a day. Exertion appears to make her shortness of breath worse and nothing makes it better. There are no other associated or modifying factors.    04/05/12- 79 yoF former smoker with COPD complicated by DM, ASCVD, hx colon cancer, anemia., GERD, esophageal varices At ER - see note above: COPD exacerb, UTI. Small LUL nodules seemed larger>CT.   Daughter Monique Fleming  here Unfortunately she has admitted still smoking a few cigarettes daily against all our counseling.  COPD assessment test (CAT) 23/40 Emergency room visit June 27 for COPD exacerbation and UTI. She understood chest x-ray showed "nodules". When she called about it we ordered CT. She still feels tight on a prednisone taper ending today. Was on sulfa for UTI- changed to keflex.  CT chest 04/05/12- reviewed images and report with them IMPRESSION:  1. No acute findings.  2. Emphysema.  3. No pathologic adenopathy within the mediastinum or hilar  regions.  4. Prominent coronary artery calcifications  5. Cirrhosis.  Original Report Authenticated By: Rosealee Albee, M.D.   05/16/12-82 yoF former smoker with COPD complicated by DM, ASCVD, hx colon cancer, anemia., GERD, esophageal varices Denies any SOB, wheezing, cough, or congestion at this time. COPD assessment test (CAT) 15/40 Definitely feels better than last visit   08/04/12- 82 yoF former smoker with COPD complicated by DM, ASCVD, hx colon cancer, anemia., GERD, esophageal varices Breathing about the same as last visit; had few nose bleeds last week. Epistaxis needed packing because of Plavix. COPD assessment test (CAT) score 22/40  10/06/12- 83 yoF former smoker with COPD complicated by DM, ASCVD, hx colon cancer, anemia., GERD, esophageal varices FOLLOWS FOR: slight wheezing,  SOB with activity mostly, cough slight-productive a times-white in color. She had quit smoking around June of 2013. Says she feels "pretty good" with no acute respiratory problems, no recent colds. Little change in chronic light cough with occasional white sputum. Denies chest pain, swollen glands, fever. Rarely needs her nebulizer. // cxr next visit?//  02/03/13- 83 yoF former smoker with COPD complicated by DM, ASCVD, hx colon cancer, anemia., GERD, esophageal varices FOLLOWS FOR: states she is having harder time breathing due to allergy flare up; nasal drainage and sneezing. Itchy, watery eyes as well. We discussed management with antihistamines Otherwise breathing is really not substantially affect. Dyspnea with exertion doing housework. Little cough or phlegm now. She does not hear herself wheeze.  Review of Systems-See HPI Constitutional:   No weight loss, night sweats,  Fevers, chills,  +fatigue, lassitude. HEENT:   No headaches,  Difficulty swallowing,  Tooth/dental problems,  Sore throat,                + sneezing, +itching, ear ache, +nasal congestion, +post nasal drip,  CV:  No chest pain, orthopnea, PND, swelling in lower extremities, anasarca, dizziness, palpitations GI  No heartburn, indigestion, abdominal pain, nausea, vomiting,  Resp: + shortness of breath with routine exertion or at rest.  No excess mucus,   No coughing up of blood.  No change in color of mucus.  Skin: no rash or lesions. GU: MS:  No joint pain  or swelling.  Marland Kitchen Psych:  No change in mood or affect. + depression or anxiety.  No memory loss.  Objective:   Physical Exam  General- Alert, Oriented, Affect-appropriate, Distress- none acute; calm  Skin- rash-none, lesions- none, excoriation- none. Dry.  Lymphadenopathy- none Head- atraumatic            Eyes- Gross vision intact, PERRLA, conjunctivae clear secretions            Ears- Hearing-diminished            Nose- Clear, no-Septal dev, mucus, polyps, erosion,  perforation             Throat- Mallampati II , mucosa clear , drainage- none, tonsils- atrophic.  Neck- flexible , trachea midline, no stridor , thyroid nl, carotid no bruit Chest - symmetrical excursion , unlabored           Heart/CV- RRR , 1-2/6 systolic precordial murmur AS , no gallop  , no rub, nl s1 s2                           - JVD- none , edema- none, stasis changes- none, varices- none           Lung-  +distant, slight cough, + trace crackles bases , dullness-none, rub- none. +Breathless speech pattern with long sentences -seems chronic.           Chest wall-  Abd- Br/ Gen/ Rectal- Not done, not indicated Extrem- cyanosis- none, clubbing, none, atrophy- none, strength- nl Neuro- grossly intact to observation

## 2013-02-03 NOTE — Patient Instructions (Addendum)
Script refill Symbicort  Order CXR  Dx COPD

## 2013-02-14 ENCOUNTER — Encounter: Payer: Self-pay | Admitting: Internal Medicine

## 2013-02-14 NOTE — Assessment & Plan Note (Signed)
Murmur is noted but does not seem changed and she is not describing angina or syncope

## 2013-02-14 NOTE — Assessment & Plan Note (Signed)
She tells me she is not smoking

## 2013-02-14 NOTE — Assessment & Plan Note (Signed)
Pretty good symptomatic control now. Pollen is causing some rhinitis but not much lower respiratory problem. Plan-refill Symbicort, chest x-ray

## 2013-02-20 ENCOUNTER — Other Ambulatory Visit: Payer: Self-pay | Admitting: Physician Assistant

## 2013-02-21 ENCOUNTER — Telehealth: Payer: Self-pay | Admitting: Family Medicine

## 2013-02-21 ENCOUNTER — Other Ambulatory Visit: Payer: Self-pay | Admitting: *Deleted

## 2013-02-21 ENCOUNTER — Telehealth: Payer: Self-pay | Admitting: *Deleted

## 2013-02-21 DIAGNOSIS — R350 Frequency of micturition: Secondary | ICD-10-CM

## 2013-02-21 MED ORDER — CHOLESTYRAMINE LIGHT 4 GM/DOSE PO POWD: 4.0000 g | ORAL | Status: DC | PRN

## 2013-02-21 NOTE — Telephone Encounter (Signed)
Amy Esterwood PA-C said ok to refill x 2.  Pt then needs an office visit.

## 2013-02-21 NOTE — Telephone Encounter (Signed)
I will treat as soon as I have a urinalysis

## 2013-02-21 NOTE — Telephone Encounter (Signed)
i tried to return patients call and it stated messages full erase messages? Will try again in the morning

## 2013-02-21 NOTE — Telephone Encounter (Signed)
Patient says that she feels like she has a UTI again and would like to know if Dr. Abner Greenspan would either send her in another round of antibiotics or does she need to come in? She states that she would come in just to do a urinalysis

## 2013-02-21 NOTE — Telephone Encounter (Signed)
Please advise 

## 2013-02-21 NOTE — Telephone Encounter (Signed)
Message copied by Derry Skill on Tue Feb 21, 2013  4:20 PM ------      Message from: Sammuel Cooper      Created: Tue Feb 21, 2013  4:10 PM      Regarding: RE: Lanetta Inch refill       Ok  X 2 then would need rov      ----- Message -----         From: Derry Skill, CMA         Sent: 02/21/2013   2:34 PM           To: Amy Oswald Hillock, PA-C      Subject: Lanetta Inch refill                                          You saw this patient on 04-25-2012. They are requesting Lanetta Inch, got a request from Pharmacy.  Do you want to refill it ?       ------

## 2013-02-22 NOTE — Telephone Encounter (Signed)
FYI: patient also states that she has had blood in stool. I offered patient an appointment to be seen for that, but patient refuses to be seen. She states that she will just come in for the urinalysis and urine culture

## 2013-02-22 NOTE — Telephone Encounter (Signed)
Urinalysis and urine culture ordered and stephanie informed pt

## 2013-03-06 ENCOUNTER — Encounter: Payer: Self-pay | Admitting: Family Medicine

## 2013-03-06 ENCOUNTER — Ambulatory Visit: Payer: Medicare Other | Admitting: Family Medicine

## 2013-03-06 ENCOUNTER — Ambulatory Visit (INDEPENDENT_AMBULATORY_CARE_PROVIDER_SITE_OTHER): Payer: Medicare Other | Admitting: Family Medicine

## 2013-03-06 VITALS — BP 119/62 | HR 69 | Temp 98.2°F | Ht 65.5 in | Wt 129.1 lb

## 2013-03-06 DIAGNOSIS — E785 Hyperlipidemia, unspecified: Secondary | ICD-10-CM

## 2013-03-06 DIAGNOSIS — F329 Major depressive disorder, single episode, unspecified: Secondary | ICD-10-CM

## 2013-03-06 DIAGNOSIS — N390 Urinary tract infection, site not specified: Secondary | ICD-10-CM

## 2013-03-06 DIAGNOSIS — D649 Anemia, unspecified: Secondary | ICD-10-CM

## 2013-03-06 DIAGNOSIS — E119 Type 2 diabetes mellitus without complications: Secondary | ICD-10-CM

## 2013-03-06 DIAGNOSIS — R197 Diarrhea, unspecified: Secondary | ICD-10-CM

## 2013-03-06 DIAGNOSIS — A0472 Enterocolitis due to Clostridium difficile, not specified as recurrent: Secondary | ICD-10-CM

## 2013-03-06 NOTE — Patient Instructions (Addendum)
Labs prior to visit, lipid, renal, cbc, tsh, hepatic, hgba1c  Urinary Tract Infection Urinary tract infections (UTIs) can develop anywhere along your urinary tract. Your urinary tract is your body's drainage system for removing wastes and extra water. Your urinary tract includes two kidneys, two ureters, a bladder, and a urethra. Your kidneys are a pair of bean-shaped organs. Each kidney is about the size of your fist. They are located below your ribs, one on each side of your spine. CAUSES Infections are caused by microbes, which are microscopic organisms, including fungi, viruses, and bacteria. These organisms are so small that they can only be seen through a microscope. Bacteria are the microbes that most commonly cause UTIs. SYMPTOMS  Symptoms of UTIs may vary by age and gender of the patient and by the location of the infection. Symptoms in young women typically include a frequent and intense urge to urinate and a painful, burning feeling in the bladder or urethra during urination. Older women and men are more likely to be tired, shaky, and weak and have muscle aches and abdominal pain. A fever may mean the infection is in your kidneys. Other symptoms of a kidney infection include pain in your back or sides below the ribs, nausea, and vomiting. DIAGNOSIS To diagnose a UTI, your caregiver will ask you about your symptoms. Your caregiver also will ask to provide a urine sample. The urine sample will be tested for bacteria and white blood cells. White blood cells are made by your body to help fight infection. TREATMENT  Typically, UTIs can be treated with medication. Because most UTIs are caused by a bacterial infection, they usually can be treated with the use of antibiotics. The choice of antibiotic and length of treatment depend on your symptoms and the type of bacteria causing your infection. HOME CARE INSTRUCTIONS  If you were prescribed antibiotics, take them exactly as your caregiver instructs  you. Finish the medication even if you feel better after you have only taken some of the medication.  Drink enough water and fluids to keep your urine clear or pale yellow.  Avoid caffeine, tea, and carbonated beverages. They tend to irritate your bladder.  Empty your bladder often. Avoid holding urine for long periods of time.  Empty your bladder before and after sexual intercourse.  After a bowel movement, women should cleanse from front to back. Use each tissue only once. SEEK MEDICAL CARE IF:   You have back pain.  You develop a fever.  Your symptoms do not begin to resolve within 3 days. SEEK IMMEDIATE MEDICAL CARE IF:   You have severe back pain or lower abdominal pain.  You develop chills.  You have nausea or vomiting.  You have continued burning or discomfort with urination. MAKE SURE YOU:   Understand these instructions.  Will watch your condition.  Will get help right away if you are not doing well or get worse. Document Released: 07/01/2005 Document Revised: 03/22/2012 Document Reviewed: 10/30/2011 [facility] Patient Information 2014 [place], [place].

## 2013-03-07 ENCOUNTER — Encounter: Payer: Self-pay | Admitting: Family Medicine

## 2013-03-07 LAB — CBC
HCT: 36.7 % (ref 36.0–46.0)
MCH: 28.4 pg (ref 26.0–34.0)
MCV: 84 fL (ref 78.0–100.0)
Platelets: 199 10*3/uL (ref 150–400)
RDW: 15.3 % (ref 11.5–15.5)
WBC: 6.9 10*3/uL (ref 4.0–10.5)

## 2013-03-07 LAB — HEPATIC FUNCTION PANEL
Alkaline Phosphatase: 99 U/L (ref 39–117)
Bilirubin, Direct: 0.1 mg/dL (ref 0.0–0.3)
Indirect Bilirubin: 0.3 mg/dL (ref 0.0–0.9)
Total Protein: 7.1 g/dL (ref 6.0–8.3)

## 2013-03-07 LAB — URINALYSIS
Bilirubin Urine: NEGATIVE
Glucose, UA: NEGATIVE mg/dL
Ketones, ur: NEGATIVE mg/dL
Specific Gravity, Urine: 1.013 (ref 1.005–1.030)

## 2013-03-07 LAB — IRON AND TIBC
%SAT: 16 % — ABNORMAL LOW (ref 20–55)
TIBC: 283 ug/dL (ref 250–470)

## 2013-03-07 LAB — RENAL FUNCTION PANEL
BUN: 7 mg/dL (ref 6–23)
Calcium: 8.8 mg/dL (ref 8.4–10.5)
Chloride: 108 mEq/L (ref 96–112)
Glucose, Bld: 100 mg/dL — ABNORMAL HIGH (ref 70–99)
Phosphorus: 3.1 mg/dL (ref 2.3–4.6)
Potassium: 4.1 mEq/L (ref 3.5–5.3)
Sodium: 142 mEq/L (ref 135–145)

## 2013-03-07 NOTE — Assessment & Plan Note (Signed)
Has stopped statins, does not tolerate. Encouraged daily Questran. Avoid trans fats, minimize simple carbs and saturated fats.

## 2013-03-07 NOTE — Assessment & Plan Note (Signed)
Stable on current meds 

## 2013-03-07 NOTE — Assessment & Plan Note (Signed)
hgba1c 5.9. Avoid simple carbs, continue current meds

## 2013-03-07 NOTE — Assessment & Plan Note (Signed)
Now with ongoing frequent stool, roughly 5 x daily. Encouraged probiotics and has Questran which she appears to only be using intermittently. She is encouraged to use daily.

## 2013-03-07 NOTE — Progress Notes (Signed)
Patient ID: Monique Fleming, female   DOB: 06/07/1929, 79 y.o.   MRN: [id number] Monique Fleming [id number] [birth year] 03/07/2013      Progress Note-Follow Up  Subjective  Chief Complaint  Chief Complaint  Patient presents with  . Follow-up    6 week    HPI  Patient is an 79 year old Caucasian female who is in today for followup. She is noting her urinary symptoms have improved, she is still having some frequency. In the past she seen both urology and nephrology. Continues to follow with psychiatry and is doing well in this regard. Continues to have frequent loose stool as many as 5 a day. Does not red blood on tissue intermittently. No abdominal or back pain. No fevers, chest pain, palpitations, shortness of breath  Past Medical History  Diagnosis Date  . Aortic stenosis     moderate by echo 6/11 gradient 20/8mmHg for mean and peak  . TIA (transient ischemic attack)     Dr. Pearlean Brownie  . Peripheral vascular disease   . IBS (irritable bowel syndrome)   . Diabetes mellitus     type II  . Chronic bronchitis   . COPD (chronic obstructive pulmonary disease)   . Atherosclerosis   . Depression   . Anxiety     Dr Donell Beers  . Renal cyst   . Anemia     iron deficiency  . Cellulitis   . Diverticular disease   . Infectious diarrhea(009.2)   . Sinusitis   . Thrush   . Abnormal liver function test   . GERD (gastroesophageal reflux disease)   . Insomnia     chronic  . Adenocarcinoma 2008    colon,s/p right hemicolectomy  . Angina   . Seizures     hx of one seizure "  . Cirrhosis of liver     hx of  . Clostridium difficile diarrhea     Past Surgical History  Procedure Laterality Date  . Carotid endarterectomy    . Colectomy      right hemi  . Cholecystectomy  11/09    Rt Breast  . Upper gastrointestinal endoscopy      Family History  Problem Relation Age of Onset  . Colon cancer Paternal Grandmother   . Esophageal cancer Brother   . Heart disease Mother   . Diabetes  Brother   . Hypertension Father   . Rectal cancer Neg Hx   . Stomach cancer Neg Hx     History   Social History  . Marital Status: Widowed    Spouse Name: N/A    Number of Children: N/A  . Years of Education: N/A   Occupational History  . retired    Social History Main Topics  . Smoking status: Former Smoker -- 0.50 packs/day    Types: Cigarettes    Quit date: 03/29/2012  . Smokeless tobacco: Current User     Comment: Using Electronic cigarette  . Alcohol Use: No  . Drug Use: No  . Sexually Active: No   Other Topics Concern  . Not on file   Social History Narrative   Patient signed a Designated Party Release to allow her daughter, Charmaine Placido, to have access to her medical records/information. Entered by Daphane Shepherd March 24,2011 @ 2:47 pm   Dailly Caffeine Use    Current Outpatient Prescriptions on File Prior to Visit  Medication Sig Dispense Refill  . budesonide-formoterol (SYMBICORT) 80-4.5 MCG/ACT inhaler Inhale 2 puffs into the lungs 2 (two) times  daily. Rinse mouth  3 Inhaler  3  . Cholecalciferol (VITAMIN D3) 1000 UNITS tablet Take 1,000 Units by mouth daily.        . cholestyramine (QUESTRAN) 4 G packet TAKE 1 PACKET TWICE DAILY FOR 30 DAYS. TAKE 2 HOURS AWAY FROM OTHER MEDS. FOR CHOLESTEROL AND ITCHING  60 each  2  . clonazePAM (KLONOPIN) 0.5 MG tablet Take on tablet in the morning and up to 1 and 1/2 tablet (1.5  total) at bedtime as needed For sleep  75 tablet  1  . clopidogrel (PLAVIX) 75 MG tablet Take 1 tablet (75 mg total) by mouth daily.  90 tablet  3  . Cyanocobalamin (VITAMIN B-12) 500 MCG SUBL Place 1 tablet (500 mcg total) under the tongue 1 day or 1 dose.  100 tablet  3  . esomeprazole (NEXIUM) 40 MG capsule Take 40 mg by mouth 2 (two) times daily. Patient uses this medication for acid reflux.      . ferrous sulfate 325 (65 FE) MG tablet Take 1 tablet (325 mg total) by mouth daily with breakfast.  30 tablet  1  . glimepiride (AMARYL) 1 MG tablet  Take 1 mg by mouth 2 (two) times daily.       Marland Kitchen glucose blood test strip Use as instructed  100 each  3  . nitrofurantoin (MACRODANTIN) 50 MG capsule Take 1 capsule (50 mg total) by mouth 4 (four) times daily.  28 capsule  0  . phenytoin (DILANTIN) 100 MG ER capsule Take 1 capsule (100 mg total) by mouth daily. Take 100 mg every  Morning and 200 mg at bedtime  90 capsule  1  . potassium chloride (KLOR-CON) 10 MEQ CR tablet Take 20 mEq by mouth 2 (two) times daily.       No current facility-administered medications on file prior to visit.    Allergies  Allergen Reactions  . Bactrim (Sulfamethoxazole W-Trimethoprim) Shortness Of Breath  . Levofloxacin Other (See Comments)    seizure  . Pneumovax (Pneumococcal Polysaccharides) Other (See Comments)    Severe allergy-cellulitis  . Amoxicillin-Pot Clavulanate Other (See Comments)    Patient stated Drs. recommend taking Augmentin due to her PCN allergy.  . Penicillins Rash    unknown  . Haldol (Haloperidol Lactate)     Had unknown reaction many yrs ago per pt  . Morphine And Related     Confused and agitation  . Sudafed (Pseudoephedrine Hcl) Other (See Comments)    seizure    Review of Systems  Review of Systems  Constitutional: Negative for fever and malaise/fatigue.  HENT: Negative for congestion.   Eyes: Negative for discharge.  Respiratory: Negative for shortness of breath.   Cardiovascular: Negative for chest pain, palpitations and leg swelling.  Gastrointestinal: Positive for diarrhea and blood in stool. Negative for nausea and abdominal pain.  Genitourinary: Negative for dysuria.  Musculoskeletal: Negative for falls.  Skin: Negative for rash.  Neurological: Negative for loss of consciousness and headaches.  Endo/Heme/Allergies: Negative for polydipsia.  Psychiatric/Behavioral: Negative for depression and suicidal ideas. The patient is not nervous/anxious and does not have insomnia.     Objective  BP 119/62  Pulse 69   Temp(Src) 98.2 F (36.8 C) (Oral)  Ht 5' 5.5" (1.664 m)  Wt 129 lb 1.3 oz (58.55 kg)  BMI 21.15 kg/m2  SpO2 96%  Physical Exam  Physical Exam  Constitutional: She is oriented to person, place, and time and well-developed, well-nourished, and in no distress. No distress.  HENT:  Head: Normocephalic and atraumatic.  Eyes: Conjunctivae are normal.  Neck: Neck supple. No thyromegaly present.  Cardiovascular: Normal rate, regular rhythm and normal heart sounds.   Pulmonary/Chest: Effort normal and breath sounds normal. She has no wheezes.  Abdominal: She exhibits no distension and no mass.  Musculoskeletal: She exhibits no edema.  Lymphadenopathy:    She has no cervical adenopathy.  Neurological: She is alert and oriented to person, place, and time.  Skin: Skin is warm and dry. No rash noted. She is not diaphoretic.  Psychiatric: Memory, affect and judgment normal.    Lab Results  Component Value Date   TSH 2.076 03/06/2013   Lab Results  Component Value Date   WBC 6.9 03/06/2013   HGB 12.4 03/06/2013   HCT 36.7 03/06/2013   MCV 84.0 03/06/2013   PLT 199 03/06/2013   Lab Results  Component Value Date   CREATININE 0.83 03/06/2013   BUN 7 03/06/2013   NA 142 03/06/2013   K 4.1 03/06/2013   CL 108 03/06/2013   CO2 28 03/06/2013   Lab Results  Component Value Date   ALT 11 03/06/2013   AST 33 03/06/2013   ALKPHOS 99 03/06/2013   BILITOT 0.4 03/06/2013   Lab Results  Component Value Date   CHOL 130 08/10/2012   Lab Results  Component Value Date   HDL 22* 08/10/2012   Lab Results  Component Value Date   LDLCALC 76 08/10/2012   Lab Results  Component Value Date   TRIG 159* 08/10/2012   Lab Results  Component Value Date   CHOLHDL 5.9 08/10/2012     Assessment & Plan  DIABETES MELLITUS, TYPE II hgba1c 5.9. Avoid simple carbs, continue current meds  DEPRESSION Stable on current meds  C. difficile colitis Now with ongoing frequent stool, roughly 5 x daily. Encouraged probiotics and  has Questran which she appears to only be using intermittently. She is encouraged to use daily.   Hyperlipidemia Has stopped statins, does not tolerate. Encouraged daily Questran. Avoid trans fats, minimize simple carbs and saturated fats.

## 2013-03-08 LAB — URINE CULTURE: Colony Count: >=100000

## 2013-03-08 NOTE — Addendum Note (Signed)
Addended by: Court Joy on: 03/08/2013 03:42 PM   Modules accepted: Orders

## 2013-03-08 NOTE — Progress Notes (Signed)
Quick Note:  Patient Informed and voiced understanding  Pt states she is still symptomatic and will come in on Friday for another urine culture ______

## 2013-03-11 DIAGNOSIS — I519 Heart disease, unspecified: Secondary | ICD-10-CM | POA: Insufficient documentation

## 2013-03-11 DIAGNOSIS — I669 Occlusion and stenosis of unspecified cerebral artery: Secondary | ICD-10-CM | POA: Insufficient documentation

## 2013-03-14 ENCOUNTER — Telehealth: Payer: Self-pay | Admitting: *Deleted

## 2013-03-14 MED ORDER — CHOLESTYRAMINE 4 G PO PACK: PACK | ORAL | Status: DC

## 2013-03-14 NOTE — Telephone Encounter (Signed)
Message copied by Derry Skill on Tue Mar 14, 2013  9:37 AM ------      Message from: Mike Gip S      Created: Mon Mar 13, 2013  3:47 PM      Regarding: RE: Lanetta Inch refill       Ok x 3      ----- Message -----         From: Derry Skill, CMA         Sent: 02/21/2013   2:34 PM           To: Amy Oswald Hillock, PA-C      Subject: Lanetta Inch refill                                          You saw this patient on 04-25-2012. They are requesting Lanetta Inch, got a request from Pharmacy.  Do you want to refill it ?       ------

## 2013-03-17 ENCOUNTER — Telehealth: Payer: Self-pay | Admitting: Family Medicine

## 2013-03-17 MED ORDER — PHENYTOIN SODIUM EXTENDED 100 MG PO CAPS: 100.0000 mg | ORAL_CAPSULE | Freq: Every day | ORAL | Status: DC

## 2013-03-17 NOTE — Telephone Encounter (Signed)
Refill- phenytoin (dilantin) 100mg  er capsule. Take one capsule(100mg  total) by mouth daily. Take 100mg  every morning and 200mg  at bedtime. Qty 90 last fill 5.3.14

## 2013-03-28 ENCOUNTER — Encounter: Payer: Self-pay | Admitting: Family Medicine

## 2013-03-28 ENCOUNTER — Ambulatory Visit (INDEPENDENT_AMBULATORY_CARE_PROVIDER_SITE_OTHER): Payer: Medicare Other | Admitting: Family Medicine

## 2013-03-28 VITALS — BP 160/72 | HR 96 | Temp 98.2°F | Ht 65.5 in | Wt 128.0 lb

## 2013-03-28 DIAGNOSIS — K5520 Angiodysplasia of colon without hemorrhage: Secondary | ICD-10-CM

## 2013-03-28 DIAGNOSIS — R197 Diarrhea, unspecified: Secondary | ICD-10-CM

## 2013-03-28 DIAGNOSIS — D696 Thrombocytopenia, unspecified: Secondary | ICD-10-CM

## 2013-03-28 DIAGNOSIS — N390 Urinary tract infection, site not specified: Secondary | ICD-10-CM

## 2013-03-28 DIAGNOSIS — K625 Hemorrhage of anus and rectum: Secondary | ICD-10-CM

## 2013-03-28 DIAGNOSIS — E119 Type 2 diabetes mellitus without complications: Secondary | ICD-10-CM

## 2013-03-28 LAB — CBC
MCH: 27.5 pg (ref 26.0–34.0)
MCHC: 33.1 g/dL (ref 30.0–36.0)
MCV: 83 fL (ref 78.0–100.0)
Platelets: 128 10*3/uL — ABNORMAL LOW (ref 150–400)
RDW: 15.4 % (ref 11.5–15.5)

## 2013-03-28 NOTE — Patient Instructions (Addendum)
Start a probiotic such as Digestive Advantage or Align or Florastor or a generic   Clostridium Difficile Infection Clostridium difficile (C. diff) is a bacteria found in the intestinal tract or colon. Under certain conditions, it causes diarrhea and sometimes severe disease. The severe form of the disease is known as pseudomembranous colitis (often called C. diff colitis). This disease can damage the lining of the colon or cause the colon to become enlarged (toxic megacolon).  CAUSES  Your colon normally contains many different bacteria, including C. diff. The balance of bacteria in your colon can change during illness. This is especially true when you take antibiotic medicine. Taking antibiotics may allow the C. diff to grow, multiply excessively, and make a toxin that then causes illness. The elderly and people with certain medical conditions have a greater risk of getting C. diff infections. SYMPTOMS   Watery diarrhea.  Fever.  Fatigue.  Loss of appetite.  Nausea.  Abdominal swelling, pain, or tenderness.  Dehydration. DIAGNOSIS  Your symptoms may make your caregiver suspicious of a C. diff infection, especially if you have used antibiotics in the preceding weeks. However, there are only 2 ways to know for certain whether you have a C. diff infection:  A lab test that finds the toxin in your stool.  The specific appearance of an abnormality (pseudomembrane) in your colon. This can only be seen by doing a sigmoidoscopy or colonoscopy. These procedures involve passing an instrument through your rectum to look at the inside of your colon. Your caregiver will help determine if these tests are necessary. TREATMENT   Most people are successfully treated with 1 of 2 specific antibiotics, usually given by mouth. Other antibiotics you are receiving are stopped if possible.  Intravenous (IV) fluids and correction of electrolyte imbalance may be necessary.  Rarely, surgery may be needed to  remove the infected part of the intestines.  Careful hand washing by you and your caregivers is important to prevent the spread of infection. In the hospital, your caregivers may also put on gowns and gloves to prevent the spread of the C. diff bacteria. Your room is also cleaned regularly with a hospital grade disinfectant. HOME CARE INSTRUCTIONS  Drink enough fluids to keep your urine clear or pale yellow. Avoid milk, caffeine, and alcohol.  Ask your caregiver for specific rehydration instructions.  Try eating small, frequent meals rather than large meals.  Take your antibiotics as directed. Finish them even if you start to feel better.  Do not use medicines to slow diarrhea. This could delay healing or cause complications.  Wash your hands thoroughly after using the bathroom and before preparing food.  Make sure people who live with you wash their hands often, too. SEEK MEDICAL CARE IF:  Diarrhea persists longer than expected or recurs after completing your course of antibiotic treatment for the C. diff infection.  You have trouble staying hydrated. SEEK IMMEDIATE MEDICAL CARE IF:  You develop a new fever.  You have increasing abdominal pain or tenderness.  There is blood in your stools, or your stools are dark black and tarry.  You cannot hold down food or liquids. MAKE SURE YOU:   Understand these instructions.  Will watch your condition.  Will get help right away if you are not doing well or get worse. Document Released: 07/01/2005 Document Revised: 12/14/2011 Document Reviewed: 02/27/2011 [facility] Patient Information 2014 [place], [place].

## 2013-03-29 LAB — URINALYSIS
Hgb urine dipstick: NEGATIVE
Ketones, ur: NEGATIVE mg/dL
Nitrite: NEGATIVE
Protein, ur: 30 mg/dL — AB
Urobilinogen, UA: 0.2 mg/dL (ref 0.0–1.0)
pH: 6 (ref 5.0–8.0)

## 2013-03-29 NOTE — Progress Notes (Signed)
Quick Note:  Patient Informed and voiced understanding ______ 

## 2013-03-31 ENCOUNTER — Other Ambulatory Visit: Payer: Self-pay | Admitting: Family Medicine

## 2013-03-31 DIAGNOSIS — N390 Urinary tract infection, site not specified: Secondary | ICD-10-CM

## 2013-03-31 LAB — URINE CULTURE

## 2013-03-31 NOTE — Progress Notes (Signed)
Quick Note:  Patient Informed and voiced understanding ______ 

## 2013-04-01 ENCOUNTER — Encounter: Payer: Self-pay | Admitting: Family Medicine

## 2013-04-01 DIAGNOSIS — D696 Thrombocytopenia, unspecified: Secondary | ICD-10-CM

## 2013-04-01 HISTORY — DX: Thrombocytopenia, unspecified: D69.6

## 2013-04-01 NOTE — Assessment & Plan Note (Signed)
New, repeat CBC report worsening bleeding

## 2013-04-01 NOTE — Assessment & Plan Note (Signed)
No recent flares in sugar, hg ba1c 5.9

## 2013-04-01 NOTE — Assessment & Plan Note (Addendum)
Having infrequent episodes of BRBPR and burgundy stool. No anemia noted if persists will need to follow up with GI. Declines rectal exam today

## 2013-04-01 NOTE — Assessment & Plan Note (Addendum)
With significant allergies to antibiotics, patient symptomatic only with fatigue, will refer to ID for consideration, patient will repor tworsening symptoms. Encouraged probiotics, cranberry tabs and increased hydration

## 2013-04-01 NOTE — Progress Notes (Signed)
Patient ID: Monique Fleming, female   DOB: [birth year], 79 y.o.   MRN: [id number] Monique Fleming [id number] 10-17-1928 04/01/2013      Progress Note-Follow Up  Subjective  Chief Complaint  Chief Complaint  Patient presents with  . Rectal Bleeding    HPI  Patient is a 79 year old Caucasian female who is in today complaining of some recent rectal bleeding. She is a long history of angiodysplasia and hemorrhoids. Recently she's had couple of episodes of small amounts of bright red blood per was probably on the tissue. She is also noted some burgundy stool as well. Again just a couple of episodes. Has had 2 days of roughly 2 loose stool daily but no fevers or chills. No abdominal or back pain. Is complaining of some mild fatigue but no syncope or presyncope. No chest pain, palpitations, shortness of breath, GU complaints noted. Is taking medications as prescribed. No polyuria or polydipsia.  Past Medical History  Diagnosis Date  . Aortic stenosis     moderate by echo 6/11 gradient 20/33mmHg for mean and peak  . TIA (transient ischemic attack)     Dr. Pearlean Brownie  . Peripheral vascular disease   . IBS (irritable bowel syndrome)   . Diabetes mellitus     type II  . Chronic bronchitis   . COPD (chronic obstructive pulmonary disease)   . Atherosclerosis   . Depression   . Anxiety     Dr Donell Beers  . Renal cyst   . Anemia     iron deficiency  . Cellulitis   . Diverticular disease   . Infectious diarrhea(009.2)   . Sinusitis   . Thrush   . Abnormal liver function test   . GERD (gastroesophageal reflux disease)   . Insomnia     chronic  . Adenocarcinoma 2008    colon,s/p right hemicolectomy  . Angina   . Seizures     hx of one seizure "  . Cirrhosis of liver     hx of  . Clostridium difficile diarrhea   . Thrombocytopenia 04/01/2013    Past Surgical History  Procedure Laterality Date  . Carotid endarterectomy    . Colectomy      right hemi  . Cholecystectomy  11/09    Rt  Breast  . Upper gastrointestinal endoscopy      Family History  Problem Relation Age of Onset  . Colon cancer Paternal Grandmother   . Esophageal cancer Brother   . Heart disease Mother   . Diabetes Brother   . Hypertension Father   . Rectal cancer Neg Hx   . Stomach cancer Neg Hx     History   Social History  . Marital Status: Widowed    Spouse Name: N/A    Number of Children: N/A  . Years of Education: N/A   Occupational History  . retired    Social History Main Topics  . Smoking status: Former Smoker -- 0.50 packs/day    Types: Cigarettes    Quit date: 03/29/2012  . Smokeless tobacco: Current User     Comment: Using Electronic cigarette  . Alcohol Use: No  . Drug Use: No  . Sexually Active: No   Other Topics Concern  . Not on file   Social History Narrative   Patient signed a Designated Party Release to allow her daughter, Brookie Wayment, to have access to her medical records/information. Entered by Daphane Shepherd March 24,2011 @ 2:47 pm   Dailly Caffeine Use  Current Outpatient Prescriptions on File Prior to Visit  Medication Sig Dispense Refill  . budesonide-formoterol (SYMBICORT) 80-4.5 MCG/ACT inhaler Inhale 2 puffs into the lungs 2 (two) times daily. Rinse mouth  3 Inhaler  3  . Cholecalciferol (VITAMIN D3) 1000 UNITS tablet Take 1,000 Units by mouth daily.        . cholestyramine (QUESTRAN) 4 G packet Take 1 packet twice daily for 30 days. Take 2 hours away from other meds. For cholesterol and itching.  60 each  2  . clonazePAM (KLONOPIN) 0.5 MG tablet Take on tablet in the morning and up to 1 and 1/2 tablet (1.5  total) at bedtime as needed For sleep  75 tablet  1  . clopidogrel (PLAVIX) 75 MG tablet Take 1 tablet (75 mg total) by mouth daily.  90 tablet  3  . Cyanocobalamin (VITAMIN B-12) 500 MCG SUBL Place 1 tablet (500 mcg total) under the tongue 1 day or 1 dose.  100 tablet  3  . esomeprazole (NEXIUM) 40 MG capsule Take 40 mg by mouth 2 (two) times  daily. Patient uses this medication for acid reflux.      . ferrous sulfate 325 (65 FE) MG tablet Take 1 tablet (325 mg total) by mouth daily with breakfast.  30 tablet  1  . glimepiride (AMARYL) 1 MG tablet Take 1 mg by mouth 2 (two) times daily.       Marland Kitchen glucose blood test strip Use as instructed  100 each  3  . nitrofurantoin (MACRODANTIN) 50 MG capsule Take 1 capsule (50 mg total) by mouth 4 (four) times daily.  28 capsule  0  . phenytoin (DILANTIN) 100 MG ER capsule Take 1 capsule (100 mg total) by mouth daily. Take 100 mg every  Morning and 200 mg at bedtime  90 capsule  1  . potassium chloride (KLOR-CON) 10 MEQ CR tablet Take 20 mEq by mouth 2 (two) times daily.       No current facility-administered medications on file prior to visit.    Allergies  Allergen Reactions  . Bactrim (Sulfamethoxazole W-Trimethoprim) Shortness Of Breath  . Levofloxacin Other (See Comments)    seizure  . Pneumovax (Pneumococcal Polysaccharides) Other (See Comments)    Severe allergy-cellulitis  . Amoxicillin-Pot Clavulanate Other (See Comments)    Patient stated Drs. recommend taking Augmentin due to her PCN allergy.  . Penicillins Rash    unknown  . Haldol (Haloperidol Lactate)     Had unknown reaction many yrs ago per pt  . Morphine And Related     Confused and agitation  . Sudafed (Pseudoephedrine Hcl) Other (See Comments)    seizure    Review of Systems  Review of Systems  Constitutional: Positive for malaise/fatigue. Negative for fever.  HENT: Negative for congestion.   Eyes: Negative for discharge.  Respiratory: Negative for shortness of breath.   Cardiovascular: Negative for chest pain, palpitations and leg swelling.  Gastrointestinal: Positive for diarrhea and blood in stool. Negative for nausea, abdominal pain and melena.  Genitourinary: Negative for dysuria.  Musculoskeletal: Negative for falls.  Skin: Negative for rash.  Neurological: Negative for loss of consciousness and  headaches.  Endo/Heme/Allergies: Negative for polydipsia.  Psychiatric/Behavioral: Negative for depression and suicidal ideas. The patient is not nervous/anxious and does not have insomnia.     Objective  BP 160/72  Pulse 96  Temp(Src) 98.2 F (36.8 C) (Oral)  Ht 5' 5.5" (1.664 m)  Wt 128 lb (58.06 kg)  BMI 20.97  kg/m2  SpO2 94%  Physical Exam  Physical Exam  Constitutional: She is oriented to person, place, and time and well-developed, well-nourished, and in no distress. No distress.  HENT:  Head: Normocephalic and atraumatic.  Eyes: Conjunctivae are normal.  Neck: Neck supple. No thyromegaly present.  Cardiovascular: Normal rate, regular rhythm and normal heart sounds.   Pulmonary/Chest: Effort normal and breath sounds normal. She has no wheezes.  Abdominal: She exhibits no distension and no mass.  Musculoskeletal: She exhibits no edema.  Lymphadenopathy:    She has no cervical adenopathy.  Neurological: She is alert and oriented to person, place, and time.  Skin: Skin is warm and dry. No rash noted. She is not diaphoretic.  Psychiatric: Memory, affect and judgment normal.    Lab Results  Component Value Date   TSH 2.076 03/06/2013   Lab Results  Component Value Date   WBC 7.1 03/28/2013   HGB 12.8 03/28/2013   HCT 38.7 03/28/2013   MCV 83.0 03/28/2013   PLT 128* 03/28/2013   Lab Results  Component Value Date   CREATININE 0.83 03/06/2013   BUN 7 03/06/2013   NA 142 03/06/2013   K 4.1 03/06/2013   CL 108 03/06/2013   CO2 28 03/06/2013   Lab Results  Component Value Date   ALT 11 03/06/2013   AST 33 03/06/2013   ALKPHOS 99 03/06/2013   BILITOT 0.4 03/06/2013   Lab Results  Component Value Date   CHOL 130 08/10/2012   Lab Results  Component Value Date   HDL 22* 08/10/2012   Lab Results  Component Value Date   LDLCALC 76 08/10/2012   Lab Results  Component Value Date   TRIG 159* 08/10/2012   Lab Results  Component Value Date   CHOLHDL 5.9 08/10/2012     Assessment &  Plan  UTI (lower urinary tract infection) With significant allergies to antibiotics, patient symptomatic only with fatigue, will refer to ID for consideration, patient will repor tworsening symptoms. Encouraged probiotics, cranberry tabs and increased hydration  ANGIODYSPLASIA-INTESTINE Having infrequent episodes of BRBPR and burgundy stool. No anemia noted if persists will need to follow up with GI. Declines rectal exam today  Thrombocytopenia New, repeat CBC report worsening bleeding  DIABETES MELLITUS, TYPE II No recent flares in sugar, hg ba1c 5.9

## 2013-04-03 ENCOUNTER — Telehealth: Payer: Self-pay | Admitting: Family Medicine

## 2013-04-03 NOTE — Telephone Encounter (Signed)
Patient would like to know when her last tetanus shot was  

## 2013-04-03 NOTE — Telephone Encounter (Signed)
Pt was informed that her her last tetanus was done on 10-22-10.

## 2013-04-05 ENCOUNTER — Telehealth: Payer: Self-pay

## 2013-04-05 DIAGNOSIS — D691 Qualitative platelet defects: Secondary | ICD-10-CM

## 2013-04-05 LAB — CBC
HCT: 37.3 % (ref 36.0–46.0)
Hemoglobin: 12.9 g/dL (ref 12.0–15.0)
MCH: 28.5 pg (ref 26.0–34.0)
RBC: 4.53 MIL/uL (ref 3.87–5.11)

## 2013-04-05 NOTE — Telephone Encounter (Signed)
Message copied by Court Joy on Wed Apr 05, 2013  8:47 AM ------      Message from: Danise Edge A      Created: Sat Apr 01, 2013 12:24 PM       Please see if she is willing to repeat cbc this week due to low platelets and her symptoms ------

## 2013-04-05 NOTE — Telephone Encounter (Signed)
Pt informed and labs ordered 

## 2013-04-06 ENCOUNTER — Ambulatory Visit: Payer: Medicare Other | Admitting: Internal Medicine

## 2013-04-14 ENCOUNTER — Telehealth: Payer: Self-pay | Admitting: Neurology

## 2013-04-14 NOTE — Telephone Encounter (Signed)
Called and spoke to patient. Patient states her right artery in her neck pain in her neck. Patient is having a lot of burning. Patient states she is having problems explaining to me the pain she really feels. Patient wants Dr.Sethi to call her.

## 2013-04-14 NOTE — Telephone Encounter (Signed)
Pt called is stating she is having troubling pain with her right artery. Pt would like nurse to call her to get her in asap. Thanks

## 2013-04-17 NOTE — Telephone Encounter (Signed)
I spoke to pt, she relayed burning pain in R neck.  Feels like progressively getting worse over 3 wks.  Questioning doppler again.  Made appt with LLam, NP on Friday 7=14=14.

## 2013-04-18 ENCOUNTER — Ambulatory Visit (INDEPENDENT_AMBULATORY_CARE_PROVIDER_SITE_OTHER): Payer: Medicare Other | Admitting: Internal Medicine

## 2013-04-18 ENCOUNTER — Encounter: Payer: Self-pay | Admitting: Internal Medicine

## 2013-04-18 VITALS — BP 149/71 | HR 71 | Temp 97.5°F | Ht 65.0 in | Wt 131.0 lb

## 2013-04-18 DIAGNOSIS — N390 Urinary tract infection, site not specified: Secondary | ICD-10-CM

## 2013-04-18 NOTE — Progress Notes (Signed)
  Subjective:    Patient ID: Monique Fleming, female    DOB: 07-09-1929, 79 y.o.   MRN: [id number]  HPI Comes in for new patient evaluation with concern for possible UTI in the face of C diff history and antibiotic allergies.  Allergie to TMP/SMX,. Penicillin, tolerates cephalosporins, allergy to fluoroquinolones.  Has had symptoms of frequency occasionally and tends to be associated with IBS symptoms.  Has had some fatigue and sleeps a lot at night and during the day.  Unusual for her.  Also sometimes has trouble completely voiding.  Does not get fever, chills or back pain with any episodes of frequency.  She was treated for UTI about 6 months ago with antibiotics.  She did develop a severe reaction to pneumovax requiring hospitalization and antibiotics and developed C diff infection.  Did reoccur once.       Last urinalysis done on 6/24 was negative for UTI.  Her only symptom seemed to have been fatigue.  Had a positive UA with hematuria, E coli in culture and nitrites and leukocytes in Dec 2013 and associated with fever and chills.  Sent here for concern of needing to treat.  Patient continues to be afebrile and asymptomatic.    Review of Systems  Constitutional: Positive for fatigue. Negative for fever, chills, activity change, appetite change and unexpected weight change.  Respiratory: Negative for cough and shortness of breath.   Cardiovascular: Negative for leg swelling.  Gastrointestinal: Negative for nausea, abdominal pain and diarrhea.  Genitourinary: Negative for dysuria, urgency, frequency and difficulty urinating.  Musculoskeletal: Negative for back pain.  Skin: Negative for rash.  Neurological: Negative for dizziness, light-headedness and headaches.  Hematological: Negative for adenopathy.  Psychiatric/Behavioral: Negative for dysphoric mood.       Objective:   Physical Exam  Constitutional: She is oriented to person, place, and time. She appears well-developed and well-nourished.  No distress.  HENT:  Mouth/Throat: No oropharyngeal exudate.  Eyes: No scleral icterus.  Cardiovascular: Normal rate, regular rhythm and normal heart sounds.   No murmur heard. Pulmonary/Chest: Effort normal and breath sounds normal. No respiratory distress. She has no wheezes.  Abdominal: Soft. Bowel sounds are normal. She exhibits no distension.  Lymphadenopathy:    She has no cervical adenopathy.  Neurological: She is alert and oriented to person, place, and time.  Skin: Skin is warm and dry. No rash noted.  Psychiatric: She has a normal mood and affect.          Assessment & Plan:

## 2013-04-19 ENCOUNTER — Encounter: Payer: Self-pay | Admitting: Internal Medicine

## 2013-04-19 NOTE — Assessment & Plan Note (Signed)
At this time, she is asymptomatic.  Recent UA was negative.  No indication for antibiotics.  She can return if concern for infection in future.  Symptoms of fever, chills, new dysuria, increased WBC and/or flank pain, otherwise would not check urinalysis.  Only culture when positive urinalysis (positive leukocytes and nitrites) and treat if significant growth (>100,000 cfu).     Cephalosporin antibiotics ok (previously tolerated keflex).  Thanks for consult of this pleasant lady.

## 2013-04-21 ENCOUNTER — Encounter: Payer: Self-pay | Admitting: Nurse Practitioner

## 2013-04-21 ENCOUNTER — Ambulatory Visit (INDEPENDENT_AMBULATORY_CARE_PROVIDER_SITE_OTHER): Payer: Medicare Other | Admitting: Nurse Practitioner

## 2013-04-21 VITALS — BP 154/67 | HR 79 | Ht 65.5 in | Wt 132.0 lb

## 2013-04-21 DIAGNOSIS — I350 Nonrheumatic aortic (valve) stenosis: Secondary | ICD-10-CM

## 2013-04-21 DIAGNOSIS — I7090 Unspecified atherosclerosis: Secondary | ICD-10-CM

## 2013-04-21 DIAGNOSIS — I6529 Occlusion and stenosis of unspecified carotid artery: Secondary | ICD-10-CM

## 2013-04-21 DIAGNOSIS — M542 Cervicalgia: Secondary | ICD-10-CM

## 2013-04-21 DIAGNOSIS — I6523 Occlusion and stenosis of bilateral carotid arteries: Secondary | ICD-10-CM

## 2013-04-21 DIAGNOSIS — I658 Occlusion and stenosis of other precerebral arteries: Secondary | ICD-10-CM

## 2013-04-21 DIAGNOSIS — I359 Nonrheumatic aortic valve disorder, unspecified: Secondary | ICD-10-CM

## 2013-04-21 MED ORDER — GABAPENTIN 100 MG PO CAPS: 100.0000 mg | ORAL_CAPSULE | Freq: Three times a day (TID) | ORAL | Status: DC

## 2013-04-21 NOTE — Progress Notes (Signed)
[facility]  PATIENT: Monique Fleming DOB: 10-19-1928   HISTORY FROM: patient REASON FOR VISIT: neck pain   HISTORICAL  CHIEF COMPLAINT:  Chief Complaint  Patient presents with  . Follow-up    neck problems    HISTORY OF PRESENT ILLNESS: Monique Fleming is a 79 year old Caucasian female with history of stroke, mild cognitive impairment, depression, aortic stenosis and remote history of seizure. She continues to do well without any recurrent seizure or significant worsening of memory. She does crossword puzzles to keep her so busy and cognitively challenging tasks. She states in the last 2 years since her husband has died and she has been this interested in activities with little energy but she has 2 daughters to check on her regularly. She lives alone in a condominium and is still driving. She has not been seen in the office since November 2012.  She comes in the office today for new problem of right neck pain.  She states she has history of stenosis and bilateral carotid arteries with endarterectomy on the left.  She feels that neck pain is 2 to blockage on the right carotid.  Her last carotid duplex was January 2013, which showed 50-69% stenosis of the left distal CCA and significant stenotic flow in the right ECA.  Heterogeneous and acoustic shadowing I asked were observed bilaterally. Transcranial Doppler studies from January 2013 was suggestive of diffuse atherosclerosis. She states that the pain is intermittent but every day.  She states the pain is sharp and burning in nature only on the right side starting at the base of her neck and shooting up behind the right ear. She states the pain is intense it makes her clench her teeth.  She states she has found nothing to make the pain better.  She denies any recent injury or muscle strain from lifting.  REVIEW OF SYSTEMS: Full 14 system review of systems performed and notable only for:  Constitutional: Fatigue    Cardiovascular: Murmur Ear/Nose/Throat: Hearing loss, ringing in ears  Skin: N/A  Eyes: N/A  Respiratory: Short of breath, wheezing Gastroitestinal: Blood in stool, diarrhea  Genitourinary: Urination problems, blood in urine Hematology/Lymphatic: Anemia, easy bruising, easy bleeding  Endocrine: N/A Musculoskeletal:N/A  Allergy/Immunology: N/A  Neurological: N/A Psychiatric: N/A   ALLERGIES: Allergies  Allergen Reactions  . Bactrim (Sulfamethoxazole W-Trimethoprim) Shortness Of Breath  . Levofloxacin Other (See Comments)    seizure  . Pneumovax (Pneumococcal Polysaccharides) Other (See Comments)    Severe allergy-cellulitis  . Amoxicillin-Pot Clavulanate Other (See Comments)    Patient stated Drs. recommend taking Augmentin due to her PCN allergy.  . Penicillins Rash    unknown  . Haldol (Haloperidol Lactate)     Had unknown reaction many yrs ago per pt  . Morphine And Related     Confused and agitation  . Sudafed (Pseudoephedrine Hcl) Other (See Comments)    seizure    HOME MEDICATIONS: Outpatient Prescriptions Prior to Visit  Medication Sig Dispense Refill  . budesonide-formoterol (SYMBICORT) 80-4.5 MCG/ACT inhaler Inhale 2 puffs into the lungs 2 (two) times daily. Rinse mouth  3 Inhaler  3  . Cholecalciferol (VITAMIN D3) 1000 UNITS tablet Take 1,000 Units by mouth daily.        . cholestyramine (QUESTRAN) 4 G packet Take 1 packet twice daily for 30 days. Take 2 hours away from other meds. For cholesterol and itching.  60 each  2  . clonazePAM (KLONOPIN) 0.5 MG tablet Take on tablet  in the morning and up to 1 and 1/2 tablet (1.5  total) at bedtime as needed For sleep  75 tablet  1  . clopidogrel (PLAVIX) 75 MG tablet Take 1 tablet (75 mg total) by mouth daily.  90 tablet  3  . Cyanocobalamin (VITAMIN B-12) 500 MCG SUBL Place 1 tablet (500 mcg total) under the tongue 1 day or 1 dose.  100 tablet  3  . esomeprazole (NEXIUM) 40 MG capsule Take 40 mg by mouth 2 (two) times  daily. Patient uses this medication for acid reflux.      . ferrous sulfate 325 (65 FE) MG tablet Take 1 tablet (325 mg total) by mouth daily with breakfast.  30 tablet  1  . glucose blood test strip Use as instructed  100 each  3  . phenytoin (DILANTIN) 100 MG ER capsule Take 1 capsule (100 mg total) by mouth daily. Take 100 mg every  Morning and 200 mg at bedtime  90 capsule  1  . potassium chloride (KLOR-CON) 10 MEQ CR tablet Take 20 mEq by mouth 2 (two) times daily.      Marland Kitchen glimepiride (AMARYL) 1 MG tablet Take 1 mg by mouth 2 (two) times daily.       . nitrofurantoin (MACRODANTIN) 50 MG capsule Take 1 capsule (50 mg total) by mouth 4 (four) times daily.  28 capsule  0   No facility-administered medications prior to visit.    PAST MEDICAL HISTORY: Past Medical History  Diagnosis Date  . Aortic stenosis     moderate by echo 6/11 gradient 20/74mmHg for mean and peak  . TIA (transient ischemic attack)     Dr. Pearlean Brownie  . Peripheral vascular disease   . IBS (irritable bowel syndrome)   . Diabetes mellitus     type II  . Chronic bronchitis   . COPD (chronic obstructive pulmonary disease)   . Atherosclerosis   . Depression   . Anxiety     Dr Donell Beers  . Renal cyst   . Anemia     iron deficiency  . Cellulitis   . Diverticular disease   . Infectious diarrhea(009.2)   . Sinusitis   . Thrush   . Abnormal liver function test   . GERD (gastroesophageal reflux disease)   . Insomnia     chronic  . Adenocarcinoma 2008    colon,s/p right hemicolectomy  . Angina   . Seizures     hx of one seizure "  . Cirrhosis of liver     hx of  . Clostridium difficile diarrhea   . Thrombocytopenia 04/01/2013    PAST SURGICAL HISTORY: Past Surgical History  Procedure Laterality Date  . Carotid endarterectomy    . Colectomy      right hemi  . Cholecystectomy  11/09    Rt Breast  . Upper gastrointestinal endoscopy      FAMILY HISTORY: Family History  Problem Relation Age of Onset  .  Colon cancer Paternal Grandmother   . Esophageal cancer Brother   . Heart disease Mother   . Diabetes Brother   . Hypertension Father   . Rectal cancer Neg Hx   . Stomach cancer Neg Hx     SOCIAL HISTORY: History   Social History  . Marital Status: Widowed    Spouse Name: N/A    Number of Children: N/A  . Years of Education: N/A   Occupational History  . retired    Social History Main Topics  .  Smoking status: Former Smoker -- 0.50 packs/day    Types: Cigarettes    Quit date: 03/29/2012  . Smokeless tobacco: Current User     Comment: Using Electronic cigarette  . Alcohol Use: No  . Drug Use: No  . Sexually Active: No   Other Topics Concern  . Not on file   Social History Narrative   Patient signed a Designated Party Release to allow her daughter, Marjan Rosman, to have access to her medical records/information. Entered by Daphane Shepherd March 24,2011 @ 2:47 pm   Dailly Caffeine Use     PHYSICAL EXAM  Filed Vitals:   04/21/13 1340  BP: 154/67  Pulse: 79  Height: 5' 5.5" (1.664 m)  Weight: 132 lb (59.875 kg)   Body mass index is 21.62 kg/(m^2).  Generalized: In no acute distress, pleasant elderly Caucasian female  Neck: Supple, bilateral carotid bruits vs. Aortic stenosis  Cardiac: Regular rate rhythm, murmur   Pulmonary: Expiratory wheezes  Musculoskeletal: No deformity   Neurological examination   Mentation: Alert oriented to time, place, history taking, language fluent, and causual conversation  Cranial nerve II-XII: Pupils were equal round reactive to light extraocular movements were full, visual field were full on confrontational test. facial sensation and strength were normal. HEARING DECREASED bilaterally. Uvula tongue midline. head turning and shoulder shrug and were normal and symmetric. MOTOR: normal bulk and tone, full strength in the BUE, BLE, fine finger movements normal, no pronator drift SENSORY: normal and symmetric to light touch,  pinprick, temperature, vibration COORDINATION: finger-nose-finger, heel-to-shin bilaterally, there was no truncal ataxia REFLEXES: DTRs symmetric and equal bilaterally. GAIT/STATION: Rising up from seated position without assistance, normal stance, without trunk ataxia, moderate stride, good arm swing, smooth turning, able to perform tiptoe, and heel walking without difficulty. TANDEM UNSTEADY.  DIAGNOSTIC DATA (LABS, IMAGING, TESTING) - I reviewed patient records, labs, notes, testing and imaging myself where available.  CAROTID DUPLEX STUDY 10/07/11 The study is consistent with 50-69% stenosis of the left distal CCA and significant stenotic flow in the right ECA. Heterogeneous and acoustic shadow plaques were observed bilaterally.  TCD 10/07/11 This limited study is consistent with diminished CBFV pattern seen in the right MCA M2. The cause and clinical significance of this finding is unclear. In addition, abnormally elevated CBFVs in the right/left OA's and VA's, but without TCD signs of stenosis. Elevated the eyes are suggestive of diffuse atherosclerosis.  ASSESSMENT AND PLAN  Monique Fleming is a 79 year old Caucasian female with history of stroke, mild cognitive impairment, depression, aortic stenosis and remote history of seizure here with right neck pain.  PLAN: 1. We will repeat Carotid Doppler Ultrasound to evaluate for stenosis. 2. MRI of the cervical spine without contrast. 3. Gabapentin 100mg  po TID for pain. 4. Follow up in 3 months, sooner as needed.  Orders Placed This Encounter  Procedures  . US Carotid Duplex Bilateral  . MR Cervical Spine Wo Contrast   Meds ordered this encounter  Medications  . gabapentin (NEURONTIN) 100 MG capsule    Sig: Take 1 capsule (100 mg total) by mouth 3 (three) times daily.    Dispense:  90 capsule    Refill:  5    Order Specific Question:  Supervising Provider    Answer:  Suanne Marker [[id number]]   Yukiko Minnich NP-C 04/21/2013, 2:16  PM  [facility] [address], [address] [place], [place] [address] [phone]

## 2013-04-21 NOTE — Patient Instructions (Signed)
We will repeat Carotid Doppler Ultrasound to evaluate for stenosis and  MRI of the cervical spine without contrast to check for pinched nerves.  Sent a prescription for Gabapentin 100mg  to your pharmacy.  You may take 1 capsule three times a day as needed for pain.  We will call with test results.   Follow up in 3 months, sooner as needed.

## 2013-04-25 ENCOUNTER — Telehealth: Payer: Self-pay | Admitting: Internal Medicine

## 2013-04-25 NOTE — Telephone Encounter (Signed)
CY, please advise if you are okay with this change; if so which strength and dose for patient Thanks.

## 2013-04-26 MED ORDER — FLUTICASONE-SALMETEROL 100-50 MCG/DOSE IN AEPB: 1.0000 | INHALATION_SPRAY | Freq: Two times a day (BID) | RESPIRATORY_TRACT | Status: DC

## 2013-04-26 NOTE — Telephone Encounter (Signed)
rx sent for advair. Pt is aware. Monique Fleming, CMA

## 2013-04-26 NOTE — Telephone Encounter (Signed)
Per CY-1) D/C Symbicort 2) Rx Advair 100/50 #1 1 puff BID then rinse mouth with prn refills.

## 2013-05-01 ENCOUNTER — Ambulatory Visit (INDEPENDENT_AMBULATORY_CARE_PROVIDER_SITE_OTHER): Payer: Medicare Other | Admitting: Internal Medicine

## 2013-05-01 ENCOUNTER — Encounter: Payer: Self-pay | Admitting: Internal Medicine

## 2013-05-01 VITALS — BP 160/70 | HR 80 | Ht 65.5 in | Wt 134.4 lb

## 2013-05-01 DIAGNOSIS — F17200 Nicotine dependence, unspecified, uncomplicated: Secondary | ICD-10-CM

## 2013-05-01 DIAGNOSIS — J449 Chronic obstructive pulmonary disease, unspecified: Secondary | ICD-10-CM

## 2013-05-01 NOTE — Progress Notes (Signed)
Patient ID: Monique Fleming, female    DOB: 1929-04-24, 79 y.o.   MRN: [id number]  HPI 74 yoF former smoker with COPD and multiple medical problems.  Last here November 05, 2010. Since then she reports staying off cigarettes and having no significant respiratory events. Pollen bothered more in March with nasal congestion, postnasal drip, but not much wheeze or cough. She feels current meds are adequate.  04/07/11- 81 yoF current smoker with COPD complicated by DM, ASCVD, hx colon cancer, anemia., GERD, esophageal varices. Was hosp for red, inflamed arm after Pneumovax with concern then that she had cellulits. Has had UTI then yeast from antibiotics- to see her GYN Has indigestion with known liver disease and esophageal varices.  Doesn't describe active respiratory complaints- mild occasional cough, dyspnea with exertion unchanged. We talked again about smoking and tried to motivate to full cessation. Smokes a few daily.  06/09/11- 37 yoF current smoker with COPD complicated by DM, ASCVD, hx colon cancer, anemia., GERD, esophageal varices. Reports doing well. Discussed flu shot after her sore arm from the pneumovax, and decided they were different enough that there should not be a similar reaction noted with flu vax.  She has not had significant recent cough, wheeze, phlegm, chest pain or palpitation.   08/11/11-  58 yoF current smoker with COPD complicated by DM, ASCVD, hx colon cancer, anemia., GERD, esophageal varices. She described her problems with bladder infection and C. difficile colitis requiring antibiotics almost continuously for the last 5 months. Her breathing has not changed with weather and season. She gets some raspy cough occasionally but rarely produces sputum. She denies chest pain or palpitation. She is still smoking an occasional cigarette I believe, but much less than before.  10/13/11- 14 yoF current smoker with COPD complicated by DM, ASCVD, hx colon cancer, anemia., GERD,  esophageal varices. Had a viral bronchitis syndrome, largely resolved now.  12/11/11- 82 yoF current smoker with COPD complicated by DM, ASCVD, hx colon cancer, anemia., GERD, esophageal varices Pharmacist suggested she blunt stimulation from  xopenex neb solution by diluting with a little water. She isn't sure the neb makes that much difference. Asks refill Symbicort. Reviewed her complex medical problem list. Not having acute respiratory symptoms now.  02/09/12- 73 yoF former smoker with COPD complicated by DM, ASCVD, hx colon cancer, anemia., GERD, esophageal varices  Pt states breathing is the same, no better or worse since last visit  She assures me that she is not smoking. Breathing feels comfortable with little routine cough or wheeze. Uses Symbicort twice daily. She has not been using her nebulizer machine. CXR 08/27/11 reviewed w/ her IMPRESSION:  Emphysematous changes and mild left lower lobe opacity, likely  scarring or atelectasis. No acute cardiopulmonary process  identified.  Original Report Authenticated By: Waneta Martins, M.D.   03/31/12- ER visit [facility]: HPI  Patient is an 79 year old female with a history of multiple medical problems including COPD and continued smoking who presents today complaining of shortness of breath. Patient reports that this began last night. 2 days ago she was seen by her primary care provider and diagnosed with a urinary tract infection. She was started on Bactrim for this. Patient feels that her symptoms began after taking this medication at home last night. Initially the patient is noted to be wheezing she reports that she always wheezes. Patient has numerous drug allergies and feels that her symptoms are likely a drug reaction. Patient had fingerstick of 177 upon  arrival. She was afebrile and hemodynamically stable. She denied any chest pain and has no history of coronary artery disease despite all of her other medical problems. She denies  fevers but does endorse some cough. Patient has no history of DVT or PE. She denies history of heart failure. Patient is not currently on any anticoagulation for any reason. She continues to smoke 7-8 cigarettes a day. Exertion appears to make her shortness of breath worse and nothing makes it better. There are no other associated or modifying factors.    04/05/12- 70 yoF former smoker with COPD complicated by DM, ASCVD, hx colon cancer, anemia., GERD, esophageal varices At ER - see note above: COPD exacerb, UTI. Small LUL nodules seemed larger>CT.   Daughter Monique Fleming  here Unfortunately she has admitted still smoking a few cigarettes daily against all our counseling.  COPD assessment test (CAT) 23/40 Emergency room visit June 27 for COPD exacerbation and UTI. She understood chest x-ray showed "nodules". When she called about it we ordered CT. She still feels tight on a prednisone taper ending today. Was on sulfa for UTI- changed to keflex.  CT chest 04/05/12- reviewed images and report with them IMPRESSION:  1. No acute findings.  2. Emphysema.  3. No pathologic adenopathy within the mediastinum or hilar  regions.  4. Prominent coronary artery calcifications  5. Cirrhosis.  Original Report Authenticated By: Rosealee Albee, M.D.   05/16/12-82 yoF former smoker with COPD complicated by DM, ASCVD, hx colon cancer, anemia., GERD, esophageal varices Denies any SOB, wheezing, cough, or congestion at this time. COPD assessment test (CAT) 15/40 Definitely feels better than last visit   08/04/12- 82 yoF former smoker with COPD complicated by DM, ASCVD, hx colon cancer, anemia., GERD, esophageal varices Breathing about the same as last visit; had few nose bleeds last week. Epistaxis needed packing because of Plavix. COPD assessment test (CAT) score 22/40  10/06/12- 83 yoF former smoker with COPD complicated by DM, ASCVD, hx colon cancer, anemia., GERD, esophageal varices FOLLOWS FOR: slight wheezing,  SOB with activity mostly, cough slight-productive a times-white in color. She had quit smoking around June of 2013. Says she feels "pretty good" with no acute respiratory problems, no recent colds. Little change in chronic light cough with occasional white sputum. Denies chest pain, swollen glands, fever. Rarely needs her nebulizer.  02/03/13- 20 yoF former smoker with COPD complicated by DM, ASCVD, hx colon cancer, anemia., GERD, esophageal varices FOLLOWS FOR: states she is having harder time breathing due to allergy flare up; nasal drainage and sneezing. Itchy, watery eyes as well. We discussed management with antihistamines Otherwise breathing is really not substantially affect. Dyspnea with exertion doing housework. Little cough or phlegm now. She does not hear herself wheeze.  05/01/13-  29 yoF former smoker with COPD complicated by DM, ASCVD, hx colon cancer, anemia., GERD, esophageal varices FOLLOWS FOR: pt reports breathing is about the same since last visit-- pt just received advair so has not had a chance to see if it has helped-- denies any other concerns at this time Insurance wouldn't cover Symbicort, so we switched to Advair. Using E-cigarette. Slight  Occasional cough with little phlegm and no significant events. CXR 02/06/13 IMPRESSION:  Stable changes of chronic obstructive lung disease. No acute  cardiopulmonary process.  Original Report Authenticated By: Carey Bullocks, M.D.  Review of Systems-See HPI Constitutional:   No weight loss, night sweats,  Fevers, chills,  +fatigue, lassitude. HEENT:   No headaches,  Difficulty swallowing,  Tooth/dental problems,  Sore throat,                No-sneezing, no-itching, ear ache, no-nasal congestion, +post nasal drip,  CV:  No chest pain, orthopnea, PND, swelling in lower extremities, anasarca, dizziness, palpitations GI  No heartburn, indigestion, abdominal pain, nausea, vomiting,  Resp: + shortness of breath with routine exertion or at  rest.  No excess mucus,   No coughing up of blood.  No change in color of mucus.  Skin: no rash or lesions. GU: MS:  No joint pain or swelling.  Marland Kitchen Psych:  No change in mood or affect. + depression or anxiety.  No memory loss.  Objective:   Physical Exam  General- Alert, Oriented, Affect-appropriate, Distress- none acute; calm  Skin- rash-none, lesions- none, excoriation- none. Dry.  Lymphadenopathy- none Head- atraumatic            Eyes- Gross vision intact, PERRLA, conjunctivae clear secretions            Ears- Hearing-diminished            Nose- Clear, no-Septal dev, mucus, polyps, erosion, perforation             Throat- Mallampati II , mucosa clear , drainage- none, tonsils- atrophic, dentures  Neck- flexible , trachea midline, no stridor , thyroid nl, carotid no bruit Chest - symmetrical excursion , unlabored           Heart/CV- RRR , 1-2/6 systolic precordial murmur AS , no gallop  , no rub, nl s1 s2                           - JVD- none , edema- none, stasis changes- none, varices- none           Lung-  +distant, slight cough, + trace crackles bases , dullness-none, rub- none. +Breathless speech pattern with long sentences -seems chronic.           Chest wall-  Abd- Br/ Gen/ Rectal- Not done, not indicated Extrem- cyanosis- none, clubbing, none, atrophy- none, strength- nl Neuro- grossly intact to observation

## 2013-05-01 NOTE — Patient Instructions (Addendum)
Sample Advair 100- Try using the Advair inhaler 1 puff, then rinse mouth, twice daily. This is the same idea as the Symbicort you were using before.

## 2013-05-05 ENCOUNTER — Other Ambulatory Visit: Payer: Self-pay | Admitting: *Deleted

## 2013-05-05 MED ORDER — FERROUS SULFATE 325 (65 FE) MG PO TABS: 325.0000 mg | ORAL_TABLET | Freq: Every day | ORAL | Status: DC

## 2013-05-05 MED ORDER — VITAMIN B-12 500 MCG SL SUBL: 1.0000 | SUBLINGUAL_TABLET | SUBLINGUAL | Status: DC

## 2013-05-05 MED ORDER — PHENYTOIN SODIUM EXTENDED 100 MG PO CAPS: 100.0000 mg | ORAL_CAPSULE | Freq: Every day | ORAL | Status: DC

## 2013-05-05 NOTE — Telephone Encounter (Signed)
Called pt back to verify which meds. Pt states she is needing Dialantin, B12, ferrous sulfate. Inform pt will send...lmb

## 2013-05-05 NOTE — Telephone Encounter (Signed)
Left smg on vm stating needing new rx's on medication...lmb

## 2013-05-09 ENCOUNTER — Other Ambulatory Visit: Payer: Self-pay | Admitting: *Deleted

## 2013-05-09 MED ORDER — PHENYTOIN SODIUM EXTENDED 100 MG PO CAPS: 200.0000 mg | ORAL_CAPSULE | Freq: Every day | ORAL | Status: DC

## 2013-05-09 NOTE — Progress Notes (Signed)
Per fax request from [facility] for clarification on pt's Dilantin dosage, spoke with patient to confirm Sig: two capsules [200 mg total] PO QHs; Rx refaxed with correction made/SLS

## 2013-05-10 ENCOUNTER — Other Ambulatory Visit: Payer: Medicare Other

## 2013-05-12 ENCOUNTER — Other Ambulatory Visit: Payer: Medicare Other

## 2013-05-13 NOTE — Assessment & Plan Note (Signed)
Symptoms have remained mild with no recent acute infection and not smoking cigarettes. Anticipate flu vaccine in the fall. She will try Advair now that she has it- preferred by her insurance.

## 2013-05-18 ENCOUNTER — Ambulatory Visit (INDEPENDENT_AMBULATORY_CARE_PROVIDER_SITE_OTHER): Payer: Medicare Other | Admitting: Family Medicine

## 2013-05-18 ENCOUNTER — Other Ambulatory Visit: Payer: Medicare Other

## 2013-05-18 VITALS — Ht 65.5 in | Wt 133.1 lb

## 2013-05-18 DIAGNOSIS — R197 Diarrhea, unspecified: Secondary | ICD-10-CM

## 2013-05-18 DIAGNOSIS — A0472 Enterocolitis due to Clostridium difficile, not specified as recurrent: Secondary | ICD-10-CM

## 2013-05-18 DIAGNOSIS — E119 Type 2 diabetes mellitus without complications: Secondary | ICD-10-CM

## 2013-05-18 DIAGNOSIS — N390 Urinary tract infection, site not specified: Secondary | ICD-10-CM

## 2013-05-18 LAB — HEPATIC FUNCTION PANEL
AST: 32 U/L (ref 0–37)
Alkaline Phosphatase: 98 U/L (ref 39–117)
Bilirubin, Direct: 0.2 mg/dL (ref 0.0–0.3)
Total Bilirubin: 0.7 mg/dL (ref 0.3–1.2)

## 2013-05-18 LAB — CBC
Hemoglobin: 12 g/dL (ref 12.0–15.0)
RBC: 4.21 MIL/uL (ref 3.87–5.11)
WBC: 8 10*3/uL (ref 4.0–10.5)

## 2013-05-18 LAB — RENAL FUNCTION PANEL
CO2: 28 mEq/L (ref 19–32)
Chloride: 105 mEq/L (ref 96–112)
Phosphorus: 2.6 mg/dL (ref 2.3–4.6)
Potassium: 3.9 mEq/L (ref 3.5–5.3)
Sodium: 138 mEq/L (ref 135–145)

## 2013-05-18 NOTE — Patient Instructions (Addendum)
Digestive Advantage probiotics gummies or the capsule  Diarrhea Diarrhea is frequent loose and watery bowel movements. It can cause you to feel weak and dehydrated. Dehydration can cause you to become tired and thirsty, have a dry mouth, and have decreased urination that often is dark yellow. Diarrhea is a sign of another problem, most often an infection that will not last long. In most cases, diarrhea typically lasts 2 3 days. However, it can last longer if it is a sign of something more serious. It is important to treat your diarrhea as directed by your caregive to lessen or prevent future episodes of diarrhea. CAUSES  Some common causes include:  Gastrointestinal infections caused by viruses, bacteria, or parasites.  Food poisoning or food allergies.  Certain medicines, such as antibiotics, chemotherapy, and laxatives.  Artificial sweeteners and fructose.  Digestive disorders. HOME CARE INSTRUCTIONS  Ensure adequate fluid intake (hydration): have 1 cup (8 oz) of fluid for each diarrhea episode. Avoid fluids that contain simple sugars or sports drinks, fruit juices, whole milk products, and sodas. Your urine should be clear or pale yellow if you are drinking enough fluids. Hydrate with an oral rehydration solution that you can purchase at pharmacies, retail stores, and online. You can prepare an oral rehydration solution at home by mixing the following ingredients together:    tsp table salt.   tsp baking soda.   tsp salt substitute containing potassium chloride.  1  tablespoons sugar.  1 L (34 oz) of water.  Certain foods and beverages may increase the speed at which food moves through the gastrointestinal (GI) tract. These foods and beverages should be avoided and include:  Caffeinated and alcoholic beverages.  High-fiber foods, such as raw fruits and vegetables, nuts, seeds, and whole grain breads and cereals.  Foods and beverages sweetened with sugar alcohols, such as  xylitol, sorbitol, and mannitol.  Some foods may be well tolerated and may help thicken stool including:  Starchy foods, such as rice, toast, pasta, low-sugar cereal, oatmeal, grits, baked potatoes, crackers, and bagels.  Bananas.  Applesauce.  Add probiotic-rich foods to help increase healthy bacteria in the GI tract, such as yogurt and fermented milk products.  Wash your hands well after each diarrhea episode.  Only take over-the-counter or prescription medicines as directed by your caregiver.  Take a warm bath to relieve any burning or pain from frequent diarrhea episodes. SEEK IMMEDIATE MEDICAL CARE IF:   You are unable to keep fluids down.  You have persistent vomiting.  You have blood in your stool, or your stools are black and tarry.  You do not urinate in 6 8 hours, or there is only a small amount of very dark urine.  You have abdominal pain that increases or localizes.  You have weakness, dizziness, confusion, or lightheadedness.  You have a severe headache.  Your diarrhea gets worse or does not get better.  You have a fever or persistent symptoms for more than 2 3 days.  You have a fever and your symptoms suddenly get worse. MAKE SURE YOU:   Understand these instructions.  Will watch your condition.  Will get help right away if you are not doing well or get worse. Document Released: 09/11/2002 Document Revised: 09/07/2012 Document Reviewed: 05/29/2012 [facility] Patient Information 2014 [place], [place].

## 2013-05-19 LAB — URINE CULTURE: Colony Count: 3000

## 2013-05-19 LAB — URINALYSIS
Bilirubin Urine: NEGATIVE
Glucose, UA: NEGATIVE mg/dL
Hgb urine dipstick: NEGATIVE
Leukocytes, UA: NEGATIVE
Protein, ur: 30 mg/dL — AB

## 2013-05-19 LAB — SEDIMENTATION RATE: Sed Rate: 67 mm/hr — ABNORMAL HIGH (ref 0–22)

## 2013-05-21 ENCOUNTER — Encounter: Payer: Self-pay | Admitting: Family Medicine

## 2013-05-21 NOTE — Progress Notes (Signed)
Patient ID: Monique Fleming, female   DOB: Aug 25, 1929, 79 y.o.   MRN: [id number] Monique Fleming [id number] [birth year] 05/21/2013      Progress Note-Follow Up  Subjective  Chief Complaint  Chief Complaint  Patient presents with  . Diarrhea    Pt reports chronic intermittent diarrhea. 8-9 loose stools yesterday. Also reports history of intermittent rectal bleeding due to hemorrhoids. Had increased bright red blood with bowel movement 2 days ago.  Marland Kitchen Extremity Weakness    Pt states her right leg gave away with her 2 days ago. Wonders if her potassium may be low.    HPI  Patient is an 79 year old Caucasian female who is in today complaining of persistent loose stool. His numerous bowel movements daily and he recently worsened. She sees blood on the tissue frequently. It is bright red and she has rectal irritation consistent with hemorrhoids she's had in the past. She has some low-grade abdominal cramping but no severe pain. No anorexia nausea or vomiting. No chest pain or palpitations no shortness of breath. No GU complaints except for some urinary urgency at times. No dysuria. Blood sugars well-controlled. Taking medications as prescribed  Past Medical History  Diagnosis Date  . Aortic stenosis     moderate by echo 6/11 gradient 20/13mmHg for mean and peak  . TIA (transient ischemic attack)     Dr. Pearlean Brownie  . Peripheral vascular disease   . IBS (irritable bowel syndrome)   . Diabetes mellitus     type II  . Chronic bronchitis   . COPD (chronic obstructive pulmonary disease)   . Atherosclerosis   . Depression   . Anxiety     Dr Donell Beers  . Renal cyst   . Anemia     iron deficiency  . Cellulitis   . Diverticular disease   . Infectious diarrhea(009.2)   . Sinusitis   . Thrush   . Abnormal liver function test   . GERD (gastroesophageal reflux disease)   . Insomnia     chronic  . Adenocarcinoma 2008    colon,s/p right hemicolectomy  . Angina   . Seizures     hx of one seizure "   . Cirrhosis of liver     hx of  . Clostridium difficile diarrhea   . Thrombocytopenia 04/01/2013    Past Surgical History  Procedure Laterality Date  . Carotid endarterectomy    . Colectomy      right hemi  . Cholecystectomy  11/09    Rt Breast  . Upper gastrointestinal endoscopy      Family History  Problem Relation Age of Onset  . Colon cancer Paternal Grandmother   . Esophageal cancer Brother   . Heart disease Mother   . Diabetes Brother   . Hypertension Father   . Rectal cancer Neg Hx   . Stomach cancer Neg Hx     History   Social History  . Marital Status: Widowed    Spouse Name: N/A    Number of Children: N/A  . Years of Education: N/A   Occupational History  . retired    Social History Main Topics  . Smoking status: Former Smoker -- 0.50 packs/day    Types: Cigarettes    Quit date: 03/29/2012  . Smokeless tobacco: Current User     Comment: Using Electronic cigarette  . Alcohol Use: No  . Drug Use: No  . Sexual Activity: No   Other Topics Concern  . Not on file  Social History Narrative   Patient signed a Administrator, Civil Service to allow her daughter, Oona Trammel, to have access to her medical records/information. Entered by Daphane Shepherd March 24,2011 @ 2:47 pm   Dailly Caffeine Use    Current Outpatient Prescriptions on File Prior to Visit  Medication Sig Dispense Refill  . Cholecalciferol (VITAMIN D3) 1000 UNITS tablet Take 1,000 Units by mouth daily.        . cholestyramine (QUESTRAN) 4 G packet Take 1 packet twice daily for 30 days. Take 2 hours away from other meds. For cholesterol and itching.  60 each  2  . clonazePAM (KLONOPIN) 0.5 MG tablet Take on tablet in the morning and up to 1 and 1/2 tablet (1.5  total) at bedtime as needed For sleep  75 tablet  1  . clopidogrel (PLAVIX) 75 MG tablet Take 1 tablet (75 mg total) by mouth daily.  90 tablet  3  . Cyanocobalamin (VITAMIN B-12) 500 MCG SUBL Place 1 tablet (500 mcg total) under the  tongue 1 day or 1 dose.  90 tablet  3  . esomeprazole (NEXIUM) 40 MG capsule Take 40 mg by mouth 2 (two) times daily. Patient uses this medication for acid reflux.      . ferrous sulfate 325 (65 FE) MG tablet Take 1 tablet (325 mg total) by mouth daily with breakfast.  90 tablet  1  . Fluticasone-Salmeterol (ADVAIR DISKUS) 100-50 MCG/DOSE AEPB Inhale 1 puff into the lungs 2 (two) times daily.  60 each  11  . gabapentin (NEURONTIN) 100 MG capsule Take 1 capsule (100 mg total) by mouth 3 (three) times daily.  90 capsule  5  . glucose blood test strip Use as instructed  100 each  3  . phenytoin (DILANTIN) 100 MG ER capsule Take 2 capsules (200 mg total) by mouth at bedtime.  90 capsule  1  . potassium chloride (KLOR-CON) 10 MEQ CR tablet Take 20 mEq by mouth 2 (two) times daily.       No current facility-administered medications on file prior to visit.    Allergies  Allergen Reactions  . Bactrim [Sulfamethoxazole W-Trimethoprim] Shortness Of Breath  . Levofloxacin Other (See Comments)    seizure  . Pneumovax [Pneumococcal Polysaccharides] Other (See Comments)    Severe allergy-cellulitis  . Amoxicillin-Pot Clavulanate Other (See Comments)    Patient stated Drs. recommend taking Augmentin due to her PCN allergy.  . Penicillins Rash    unknown  . Haldol [Haloperidol Lactate]     Had unknown reaction many yrs ago per pt  . Morphine And Related     Confused and agitation  . Sudafed [Pseudoephedrine Hcl] Other (See Comments)    seizure    Review of Systems  Review of Systems  Constitutional: Negative for fever and malaise/fatigue.  HENT: Negative for congestion.   Eyes: Negative for discharge.  Respiratory: Negative for shortness of breath.   Cardiovascular: Negative for chest pain, palpitations and leg swelling.  Gastrointestinal: Positive for abdominal pain, diarrhea and blood in stool. Negative for nausea, constipation and melena.  Genitourinary: Negative for dysuria.   Musculoskeletal: Negative for falls.  Skin: Negative for rash.  Neurological: Negative for loss of consciousness and headaches.  Endo/Heme/Allergies: Negative for polydipsia.  Psychiatric/Behavioral: Negative for depression and suicidal ideas. The patient is not nervous/anxious and does not have insomnia.     Objective  Ht 5' 5.5" (1.664 m)  Wt 133 lb 1.9 oz (60.383 kg)  BMI 21.81 kg/m2  Physical Exam  Physical Exam  Constitutional: She is oriented to person, place, and time and well-developed, well-nourished, and in no distress. No distress.  HENT:  Head: Normocephalic and atraumatic.  Eyes: Conjunctivae are normal.  Neck: Neck supple. No thyromegaly present.  Cardiovascular: Normal rate, regular rhythm and normal heart sounds.   No murmur heard. Pulmonary/Chest: Effort normal and breath sounds normal. She has no wheezes.  Abdominal: She exhibits no distension and no mass.  Musculoskeletal: She exhibits no edema.  Lymphadenopathy:    She has no cervical adenopathy.  Neurological: She is alert and oriented to person, place, and time.  Skin: Skin is warm and dry. No rash noted. She is not diaphoretic.  Psychiatric: Memory, affect and judgment normal.    Lab Results  Component Value Date   TSH 2.076 03/06/2013   Lab Results  Component Value Date   WBC 8.0 05/18/2013   HGB 12.0 05/18/2013   HCT 35.5* 05/18/2013   MCV 84.3 05/18/2013   PLT 134* 05/18/2013   Lab Results  Component Value Date   CREATININE 0.91 05/18/2013   BUN 9 05/18/2013   NA 138 05/18/2013   K 3.9 05/18/2013   CL 105 05/18/2013   CO2 28 05/18/2013   Lab Results  Component Value Date   ALT 11 05/18/2013   AST 32 05/18/2013   ALKPHOS 98 05/18/2013   BILITOT 0.7 05/18/2013   Lab Results  Component Value Date   CHOL 130 08/10/2012   Lab Results  Component Value Date   HDL 22* 08/10/2012   Lab Results  Component Value Date   LDLCALC 76 08/10/2012   Lab Results  Component Value Date   TRIG 159*  08/10/2012   Lab Results  Component Value Date   CHOLHDL 5.9 08/10/2012     Assessment & Plan  C. difficile colitis Continues to have diarrhea and intermittent blood in stool. Repeat stool cultures ordered today  DIABETES MELLITUS, TYPE II Well controlled, hgba1c  5.9, no changes  UTI (lower urinary tract infection) Will continue to monitor, no infection today

## 2013-05-21 NOTE — Assessment & Plan Note (Signed)
Well controlled, hgba1c  5.9, no changes

## 2013-05-21 NOTE — Assessment & Plan Note (Signed)
Will continue to monitor, no infection today

## 2013-05-21 NOTE — Assessment & Plan Note (Signed)
Continues to have diarrhea and intermittent blood in stool. Repeat stool cultures ordered today

## 2013-05-23 ENCOUNTER — Ambulatory Visit (INDEPENDENT_AMBULATORY_CARE_PROVIDER_SITE_OTHER): Payer: Medicare Other

## 2013-05-23 DIAGNOSIS — I6529 Occlusion and stenosis of unspecified carotid artery: Secondary | ICD-10-CM

## 2013-05-23 DIAGNOSIS — I7090 Unspecified atherosclerosis: Secondary | ICD-10-CM

## 2013-05-23 DIAGNOSIS — I6523 Occlusion and stenosis of bilateral carotid arteries: Secondary | ICD-10-CM

## 2013-05-23 DIAGNOSIS — I350 Nonrheumatic aortic (valve) stenosis: Secondary | ICD-10-CM

## 2013-05-24 NOTE — Progress Notes (Signed)
Quick Note:  Patient Informed and voiced understanding but had a hard time understanding.   I informed pt that I would mail a copy to her.   ______

## 2013-05-30 ENCOUNTER — Telehealth: Payer: Self-pay | Admitting: Nurse Practitioner

## 2013-06-02 NOTE — Telephone Encounter (Signed)
error 

## 2013-06-06 ENCOUNTER — Ambulatory Visit: Payer: Medicare Other | Admitting: Family Medicine

## 2013-06-07 ENCOUNTER — Telehealth: Payer: Self-pay | Admitting: Hematology & Oncology

## 2013-06-07 NOTE — Telephone Encounter (Signed)
Pt made 9-10 appointment

## 2013-06-14 ENCOUNTER — Other Ambulatory Visit (HOSPITAL_BASED_OUTPATIENT_CLINIC_OR_DEPARTMENT_OTHER): Payer: Medicare Other | Admitting: Lab

## 2013-06-14 ENCOUNTER — Ambulatory Visit (HOSPITAL_BASED_OUTPATIENT_CLINIC_OR_DEPARTMENT_OTHER): Payer: Medicare Other | Admitting: Hematology & Oncology

## 2013-06-14 VITALS — BP 168/50 | HR 75 | Temp 98.3°F | Resp 14 | Ht 65.0 in | Wt 133.0 lb

## 2013-06-14 DIAGNOSIS — C189 Malignant neoplasm of colon, unspecified: Secondary | ICD-10-CM

## 2013-06-14 DIAGNOSIS — Z85038 Personal history of other malignant neoplasm of large intestine: Secondary | ICD-10-CM

## 2013-06-14 DIAGNOSIS — D509 Iron deficiency anemia, unspecified: Secondary | ICD-10-CM

## 2013-06-14 DIAGNOSIS — D485 Neoplasm of uncertain behavior of skin: Secondary | ICD-10-CM

## 2013-06-14 DIAGNOSIS — R634 Abnormal weight loss: Secondary | ICD-10-CM

## 2013-06-14 LAB — CBC WITH DIFFERENTIAL (CANCER CENTER ONLY)
BASO#: 0 10*3/uL (ref 0.0–0.2)
Eosinophils Absolute: 0.3 10*3/uL (ref 0.0–0.5)
HGB: 13 g/dL (ref 11.6–15.9)
MCV: 87 fL (ref 81–101)
MONO#: 0.6 10*3/uL (ref 0.1–0.9)
NEUT#: 3.8 10*3/uL (ref 1.5–6.5)
Platelets: 112 10*3/uL — ABNORMAL LOW (ref 145–400)
RBC: 4.49 10*6/uL (ref 3.70–5.32)
WBC: 6.2 10*3/uL (ref 3.9–10.0)

## 2013-06-14 NOTE — Progress Notes (Signed)
This office note has been dictated.

## 2013-06-15 ENCOUNTER — Other Ambulatory Visit (HOSPITAL_BASED_OUTPATIENT_CLINIC_OR_DEPARTMENT_OTHER): Payer: Medicare Other | Admitting: Lab

## 2013-06-15 ENCOUNTER — Other Ambulatory Visit: Payer: Self-pay | Admitting: *Deleted

## 2013-06-15 ENCOUNTER — Ambulatory Visit: Payer: Medicare Other | Admitting: Family Medicine

## 2013-06-15 DIAGNOSIS — E875 Hyperkalemia: Secondary | ICD-10-CM

## 2013-06-15 LAB — BASIC METABOLIC PANEL - CANCER CENTER ONLY
Chloride: 104 mEq/L (ref 98–108)
Potassium: 4.3 mEq/L (ref 3.3–4.7)
Sodium: 141 mEq/L (ref 128–145)

## 2013-06-15 LAB — COMPREHENSIVE METABOLIC PANEL
ALT: 14 U/L (ref 0–35)
Albumin: 3.7 g/dL (ref 3.5–5.2)
CO2: 26 mEq/L (ref 19–32)
Calcium: 8.8 mg/dL (ref 8.4–10.5)
Chloride: 104 mEq/L (ref 96–112)
Sodium: 138 mEq/L (ref 135–145)
Total Protein: 7.4 g/dL (ref 6.0–8.3)

## 2013-06-15 LAB — CEA: CEA: 7.6 ng/mL — ABNORMAL HIGH (ref 0.0–5.0)

## 2013-06-15 LAB — IRON AND TIBC CHCC: Iron: 59 ug/dL (ref 41–142)

## 2013-06-15 NOTE — Progress Notes (Signed)
CC:   Monique Edge, MD  DIAGNOSES: 1. Stage I (T1 N0 M0) adenocarcinoma of the right colon. 2. Intermittent iron-deficiency anemia.  CURRENT THERAPY:  Observation.  INTERIM HISTORY:  Monique Fleming comes in for followup.  She is not doing well.  She has lost a lot of weight since we last saw her.  We last saw her back in January.  At that point in time, she was having some issues. She has been seen by Gastroenterology.  She has had a colonoscopy.  She says she just does not want to eat.  I just could not imagine anything related to the cancer being a problem for her.  She does have other health issues.  The patient has been seen by Neurology.  She does have a carotid artery stenosis.  She has had no issues with respect to colon cancer.  She is now 6 years out from resection.  She had her resection back in May 2008.  Again, she has lost about 20 pounds since we last saw her.  She denies any bleeding.  There have been no rashes.  There has been no cough.  She used to smoke but stopped it 2 years ago.  Overall, her performance status is ECOG 2.  PHYSICAL EXAMINATION:  General:  This is an elderly, somewhat ill- appearing white female in no obvious distress.  Vital signs: Temperature of 98.3, pulse 75, respiratory rate 14, blood pressure 168/50.  Weight is 133.  Head and neck:  Normocephalic, atraumatic skull.  There are no ocular or oral lesions.  There are no palpable cervical or supraclavicular lymph nodes.  Lungs:  Clear bilaterally. Cardiac:  Regular rate and rhythm with a normal S1 and S2.  She has no murmurs, rubs or bruits.  Abdomen:  Soft.  She has decent bowel sounds. There is no fluid wave.  There is no palpable hepatosplenomegaly. Extremities:  Some muscle atrophy in upper and lower extremities.  She has good range motion of her joints.  Skin:  No rashes, ecchymoses or petechia.  LABORATORY STUDIES:  White cell count of 6.2, hemoglobin 13, hematocrit 39.1, platelet  count is 112.  MCV is 87.  White cell differential shows 61 segs, 24 lymphs, 10 monos.  IMPRESSION:  Monique Fleming is a 79 year old female with a history of stage I adenocarcinoma of the right colon.  Again, I could not imagine this being a problem with any recurrence.  We will get a CT scan on her.  I will see if I can find anything that I can help her with.  She is declining.  Again, I do not know if this is age related or what.  I want see her back in about a month.    ______________________________ Josph Macho, M.D. PRE/MEDQ  D:  06/14/2013  T:  06/15/2013  Job:  [id number]

## 2013-06-15 NOTE — Progress Notes (Signed)
K+ 5.7 yesterday. Needs stat [facility] today. Pt called and will come out today at ~ 2:30.

## 2013-06-21 ENCOUNTER — Emergency Department (HOSPITAL_COMMUNITY)
Admission: EM | Admit: 2013-06-21 | Discharge: 2013-06-21 | Disposition: A | Discharge status: Home / Self Care | Payer: Medicare Other | Attending: Emergency Medicine | Admitting: Emergency Medicine

## 2013-06-21 ENCOUNTER — Encounter (HOSPITAL_COMMUNITY): Payer: Self-pay | Admitting: Emergency Medicine

## 2013-06-21 ENCOUNTER — Emergency Department (HOSPITAL_COMMUNITY): Payer: Medicare Other

## 2013-06-21 DIAGNOSIS — S0003XA Contusion of scalp, initial encounter: Secondary | ICD-10-CM | POA: Insufficient documentation

## 2013-06-21 DIAGNOSIS — Z87891 Personal history of nicotine dependence: Secondary | ICD-10-CM | POA: Insufficient documentation

## 2013-06-21 DIAGNOSIS — W010XXA Fall on same level from slipping, tripping and stumbling without subsequent striking against object, initial encounter: Secondary | ICD-10-CM | POA: Insufficient documentation

## 2013-06-21 DIAGNOSIS — T07XXXA Unspecified multiple injuries, initial encounter: Secondary | ICD-10-CM

## 2013-06-21 DIAGNOSIS — Y929 Unspecified place or not applicable: Secondary | ICD-10-CM | POA: Insufficient documentation

## 2013-06-21 DIAGNOSIS — S5000XA Contusion of unspecified elbow, initial encounter: Secondary | ICD-10-CM | POA: Insufficient documentation

## 2013-06-21 DIAGNOSIS — K219 Gastro-esophageal reflux disease without esophagitis: Secondary | ICD-10-CM | POA: Insufficient documentation

## 2013-06-21 DIAGNOSIS — Z79899 Other long term (current) drug therapy: Secondary | ICD-10-CM | POA: Insufficient documentation

## 2013-06-21 DIAGNOSIS — F3289 Other specified depressive episodes: Secondary | ICD-10-CM | POA: Insufficient documentation

## 2013-06-21 DIAGNOSIS — IMO0002 Reserved for concepts with insufficient information to code with codable children: Secondary | ICD-10-CM | POA: Insufficient documentation

## 2013-06-21 DIAGNOSIS — F329 Major depressive disorder, single episode, unspecified: Secondary | ICD-10-CM | POA: Insufficient documentation

## 2013-06-21 DIAGNOSIS — S8000XA Contusion of unspecified knee, initial encounter: Secondary | ICD-10-CM | POA: Insufficient documentation

## 2013-06-21 DIAGNOSIS — Y9301 Activity, walking, marching and hiking: Secondary | ICD-10-CM | POA: Insufficient documentation

## 2013-06-21 DIAGNOSIS — J449 Chronic obstructive pulmonary disease, unspecified: Secondary | ICD-10-CM | POA: Insufficient documentation

## 2013-06-21 DIAGNOSIS — W19XXXA Unspecified fall, initial encounter: Secondary | ICD-10-CM

## 2013-06-21 DIAGNOSIS — E1149 Type 2 diabetes mellitus with other diabetic neurological complication: Secondary | ICD-10-CM | POA: Insufficient documentation

## 2013-06-21 DIAGNOSIS — F411 Generalized anxiety disorder: Secondary | ICD-10-CM | POA: Insufficient documentation

## 2013-06-21 DIAGNOSIS — I739 Peripheral vascular disease, unspecified: Secondary | ICD-10-CM | POA: Insufficient documentation

## 2013-06-21 DIAGNOSIS — Z880 Allergy status to penicillin: Secondary | ICD-10-CM | POA: Insufficient documentation

## 2013-06-21 DIAGNOSIS — J4489 Other specified chronic obstructive pulmonary disease: Secondary | ICD-10-CM | POA: Insufficient documentation

## 2013-06-21 DIAGNOSIS — K589 Irritable bowel syndrome without diarrhea: Secondary | ICD-10-CM | POA: Insufficient documentation

## 2013-06-21 LAB — CBC WITH DIFFERENTIAL/PLATELET
Basophils Absolute: 0.1 10*3/uL (ref 0.0–0.1)
Eosinophils Absolute: 0.4 10*3/uL (ref 0.0–0.7)
Eosinophils Relative: 5 % (ref 0–5)
HCT: 37.5 % (ref 36.0–46.0)
Lymphocytes Relative: 21 % (ref 12–46)
MCH: 28.3 pg (ref 26.0–34.0)
MCV: 84.1 fL (ref 78.0–100.0)
Monocytes Absolute: 0.7 10*3/uL (ref 0.1–1.0)
RDW: 14.6 % (ref 11.5–15.5)
WBC: 7.5 10*3/uL (ref 4.0–10.5)

## 2013-06-21 LAB — COMPREHENSIVE METABOLIC PANEL
AST: 39 U/L — ABNORMAL HIGH (ref 0–37)
CO2: 24 mEq/L (ref 19–32)
Calcium: 9 mg/dL (ref 8.4–10.5)
Creatinine, Ser: 0.74 mg/dL (ref 0.50–1.10)
GFR calc Af Amer: >90 mL/min (ref >90)
GFR calc non Af Amer: 78 mL/min — ABNORMAL LOW (ref >90)
Glucose, Bld: 147 mg/dL — ABNORMAL HIGH (ref 70–99)
Total Protein: 7.5 g/dL (ref 6.0–8.3)

## 2013-06-21 LAB — PROTIME-INR: INR: 1.2 (ref 0.00–1.49)

## 2013-06-21 MED ORDER — ONDANSETRON 4 MG PO TBDP
4.0000 mg | ORAL_TABLET | Freq: Once | ORAL | Status: AC
  Administered 2013-06-21: 4 mg via ORAL
  Filled 2013-06-21: qty 1

## 2013-06-21 MED ORDER — TETANUS-DIPHTH-ACELL PERTUSSIS 5-2.5-18.5 LF-MCG/0.5 IM SUSP
0.5000 mL | Freq: Once | INTRAMUSCULAR | Status: AC
  Administered 2013-06-21: 0.5 mL via INTRAMUSCULAR
  Filled 2013-06-21: qty 0.5

## 2013-06-21 MED ORDER — HYDROCODONE-ACETAMINOPHEN 5-325 MG PO TABS: 2.0000 | ORAL_TABLET | ORAL | Status: DC | PRN

## 2013-06-21 MED ORDER — HYDROCODONE-ACETAMINOPHEN 5-325 MG PO TABS
1.0000 | ORAL_TABLET | Freq: Once | ORAL | Status: AC
  Administered 2013-06-21: 1 via ORAL
  Filled 2013-06-21: qty 1

## 2013-06-21 NOTE — ED Provider Notes (Signed)
CSN: [id number]     Arrival date & time 06/21/13  1920 History   First MD Initiated Contact with Patient 06/21/13 1927     Chief Complaint  Patient presents with  . Fall  . Head Injury   (Consider location/radiation/quality/duration/timing/severity/associated sxs/prior Treatment) HPI Comments: Patient presents after mechanical fall while walking on the sidewalk.  She states the ground was uneven and her flip flop got caught. She fell landing on her knees and elbows. She also hit her head, denies losing consciousness.  C/o pain to R knee and bilateral elbows and head.  Denies any chest pain, shortness of breath, dizziness or lightheadedness. She is on Plavix but not Coumadin. She denies any focal weakness, numbness or tingling. Denies any neck or back pain. She denies any abdominal pain.  The history is provided by the patient and the EMS personnel.    Past Medical History  Diagnosis Date  . Aortic stenosis     moderate by echo 6/11 gradient 20/87mmHg for mean and peak  . TIA (transient ischemic attack)     Dr. Pearlean Brownie  . Peripheral vascular disease   . IBS (irritable bowel syndrome)   . Diabetes mellitus     type II  . Chronic bronchitis   . COPD (chronic obstructive pulmonary disease)   . Atherosclerosis   . Depression   . Anxiety     Dr Donell Beers  . Renal cyst   . Anemia     iron deficiency  . Cellulitis   . Diverticular disease   . Infectious diarrhea(009.2)   . Sinusitis   . Thrush   . Abnormal liver function test   . GERD (gastroesophageal reflux disease)   . Insomnia     chronic  . Adenocarcinoma 2008    colon,s/p right hemicolectomy  . Angina   . Seizures     hx of one seizure "  . Cirrhosis of liver     hx of  . Clostridium difficile diarrhea   . Thrombocytopenia 04/01/2013   Past Surgical History  Procedure Laterality Date  . Carotid endarterectomy    . Colectomy      right hemi  . Cholecystectomy  11/09    Rt Breast  . Upper gastrointestinal endoscopy      Family History  Problem Relation Age of Onset  . Colon cancer Paternal Grandmother   . Esophageal cancer Brother   . Heart disease Mother   . Diabetes Brother   . Hypertension Father   . Rectal cancer Neg Hx   . Stomach cancer Neg Hx    History  Substance Use Topics  . Smoking status: Former Smoker -- 0.50 packs/day    Types: Cigarettes    Quit date: 03/29/2012  . Smokeless tobacco: Current User     Comment: Using Electronic cigarette  . Alcohol Use: No   OB History   Grav Para Term Preterm Abortions TAB SAB Ect Mult Living                 Review of Systems  Constitutional: Negative for fever, activity change and appetite change.  HENT: Negative for congestion and rhinorrhea.   Eyes: Negative for visual disturbance.  Respiratory: Negative for cough, chest tightness and shortness of breath.   Cardiovascular: Negative for chest pain.  Gastrointestinal: Negative for nausea, vomiting and abdominal pain.  Endocrine: Negative for polyuria.  Genitourinary: Negative for dysuria and hematuria.  Musculoskeletal: Positive for myalgias and arthralgias.  Skin: Positive for wound. Negative for rash.  Neurological: Negative for dizziness, weakness, light-headedness and headaches.   A complete 10 system review of systems was obtained and all systems are negative except as noted in the HPI and PMH.   Allergies  Bactrim; Levofloxacin; Pneumovax; Amoxicillin-pot clavulanate; Penicillins; Haldol; Morphine and related; and Sudafed  Home Medications   Current Outpatient Rx  Name  Route  Sig  Dispense  Refill  . Cholecalciferol (VITAMIN D3) 1000 UNITS tablet   Oral   Take 1,000 Units by mouth daily.           . cholestyramine (QUESTRAN) 4 G packet      Take 1 packet twice daily for 30 days. Take 2 hours away from other meds. For cholesterol and itching.   60 each   2   . clonazePAM (KLONOPIN) 0.5 MG tablet      Take on tablet in the morning and up to 1 and 1/2 tablet (1.5   total) at bedtime as needed For sleep   75 tablet   1   . clopidogrel (PLAVIX) 75 MG tablet   Oral   Take 1 tablet (75 mg total) by mouth daily.   90 tablet   3   . Cyanocobalamin (VITAMIN B-12) 500 MCG SUBL   Sublingual   Place 1 tablet (500 mcg total) under the tongue 1 day or 1 dose.   90 tablet   3   . esomeprazole (NEXIUM) 40 MG capsule   Oral   Take 40 mg by mouth 2 (two) times daily. Patient uses this medication for acid reflux.         . ferrous sulfate 325 (65 FE) MG tablet   Oral   Take 1 tablet (325 mg total) by mouth daily with breakfast.   90 tablet   1   . Fluticasone-Salmeterol (ADVAIR DISKUS) 100-50 MCG/DOSE AEPB   Inhalation   Inhale 1 puff into the lungs 2 (two) times daily.   60 each   11   . gabapentin (NEURONTIN) 100 MG capsule   Oral   Take 1 capsule (100 mg total) by mouth 3 (three) times daily.   90 capsule   5   . glucose blood test strip      Use as instructed   100 each   3   . HYDROcodone-acetaminophen (NORCO/VICODIN) 5-325 MG per tablet   Oral   Take 2 tablets by mouth every 4 (four) hours as needed for pain.   10 tablet   0   . phenytoin (DILANTIN) 100 MG ER capsule   Oral   Take 2 capsules (200 mg total) by mouth at bedtime.   90 capsule   1   . potassium chloride (KLOR-CON) 10 MEQ CR tablet   Oral   Take 20 mEq by mouth 2 (two) times daily.          BP 166/52  Pulse 66  Temp(Src) 98.5 F (36.9 C) (Oral)  Resp 18  Ht 5\' 5"  (1.651 m)  Wt 133 lb (60.328 kg)  BMI 22.13 kg/m2  SpO2 99% Physical Exam  Constitutional: She is oriented to person, place, and time. She appears well-developed and well-nourished. No distress.  HENT:  Head: Normocephalic.  Mouth/Throat: Oropharynx is clear and moist.  Abrasion and hematoma to R forehead and L eyebrow  Eyes: EOM are normal. Pupils are equal, round, and reactive to light.  Neck: Normal range of motion. Neck supple.  No C-spine tenderness, step-off or deformity   Cardiovascular: Normal rate, regular rhythm  and normal heart sounds.   No murmur heard. Pulmonary/Chest: Effort normal and breath sounds normal. No respiratory distress. She exhibits no tenderness.  Abdominal: Soft. Bowel sounds are normal. There is no tenderness. There is no rebound and no guarding.  Musculoskeletal: Normal range of motion. She exhibits no edema and no tenderness.  No T. or L-spine tenderness  Neurological: She is alert and oriented to person, place, and time. No cranial nerve deficit. She exhibits normal muscle tone. Coordination normal.  Skin: Skin is warm. No rash noted.  Skin tear to right forearm that is hemostatic. Skin tear and abrasion to left elbow. No bony tenderness. Large hematoma to right proximal tibia with abrasion overlying the patella. Flexion extension of knee intact. Intact DP and PT pulses bilaterally. Abrasion to left patella    ED Course  Procedures (including critical care time) Labs Review Labs Reviewed  CBC WITH DIFFERENTIAL - Abnormal; Notable for the following:    Platelets 139 (*)    All other components within normal limits  COMPREHENSIVE METABOLIC PANEL - Abnormal; Notable for the following:    Glucose, Bld 147 (*)    Albumin 3.0 (*)    AST 39 (*)    GFR calc non Af Amer 78 (*)    All other components within normal limits  PROTIME-INR   Imaging Review Dg Chest 2 View  06/21/2013   CLINICAL DATA:  History of fall.  EXAM: CHEST  2 VIEW  COMPARISON:  CHEST x-ray 02/03/2013.  FINDINGS: Mild diffuse peribronchial cuffing. Lung volumes are normal. No consolidative airspace disease. No pleural effusions. No pneumothorax. No pulmonary nodule or mass noted. Pulmonary vasculature and the cardiomediastinal silhouette are within normal limits. Atherosclerosis in the thoracic aorta.  IMPRESSION: 1. No signs of significant acute traumatic injury to the thorax. 2. Mild diffuse peribronchial cuffing which appears be similar to prior studies. This favored  to reflect chronic bronchitis. 3. Atherosclerosis.   Electronically Signed   By: Trudie Reed M.D.   On: 06/21/2013 20:46   Dg Elbow Complete Left  06/21/2013   CLINICAL DATA:  History of fall complaining of left elbow pain.  EXAM: LEFT ELBOW - COMPLETE 3+ VIEW  COMPARISON:  No priors.  FINDINGS: Four views of the left elbow demonstrate no definite acute displaced fracture, subluxation or dislocation. Chondrocalcinosis is noted.  IMPRESSION: 1. No evidence of significant acute traumatic injury to the left elbow. 2. Chondrocalcinosis.   Electronically Signed   By: Trudie Reed M.D.   On: 06/21/2013 20:49   Dg Elbow Complete Right  06/21/2013   CLINICAL DATA:  History of fall complaining of right elbow pain.  EXAM: RIGHT ELBOW - COMPLETE 3+ VIEW  COMPARISON:  No priors.  FINDINGS: Four views of the right elbow demonstrate no definite acute displaced fracture, subluxation, dislocation or joint abnormality.  IMPRESSION: No acute radiographic abnormality of the right elbow.   Electronically Signed   By: Trudie Reed M.D.   On: 06/21/2013 20:50   Ct Head Wo Contrast  06/21/2013   CLINICAL DATA:  79 year old female status post fall onto concrete floor. Hematoma.  EXAM: CT HEAD WITHOUT CONTRAST  CT CERVICAL SPINE WITHOUT CONTRAST  TECHNIQUE: Multidetector CT imaging of the head and cervical spine was performed following the standard protocol without intravenous contrast. Multiplanar CT image reconstructions of the cervical spine were also generated.  COMPARISON:  09/06/2008.  FINDINGS: CT HEAD FINDINGS  Somewhat multifocal appearing scalp hematomas in the forehead (series 3, images 10 -12). Trace  subcutaneous gas associated laterally compatible with scalp laceration. Underlying frontal bones intact. Visualized paranasal sinuses and mastoids are clear. No calvarium fracture.  Stable orbits soft tissues. Calcified atherosclerosis at the skull base.  Cerebral volume has mildly decreased in a generalized  fashion. No ventriculomegaly. Stable right frontal horn white matter hypodensity. Chronic lacunar infarct in the right cerebellum on image 12 is new since 2009. No midline shift, mass effect, or evidence of intracranial mass lesion. No acute intracranial hemorrhage identified. No evidence of cortically based acute infarction identified.  CT CERVICAL SPINE FINDINGS  Chronic straightening of cervical lordosis in stable mild anterolisthesis of C4 on C5. Associated facet degeneration at C4-C5 has increased.  Visualized skull base is intact. No atlanto-occipital dissociation. Cervicothoracic junction alignment is within normal limits. Bilateral posterior element alignment is within normal limits. No acute cervical spine fracture identified.  Emphysema in the lung apices. Calcified atherosclerosis. Visualized paraspinal soft tissues are within normal limits.  IMPRESSION: CT HEAD IMPRESSION  1. Scalp soft tissue injury without underlying fracture.  2. No acute traumatic injury to the brain. Mild for age chronic small vessel disease has mildly progressed since 2009.  CT CERVICAL SPINE IMPRESSION  No acute fracture or listhesis identified in the cervical spine. Ligamentous injury is not excluded.   Electronically Signed   By: Augusto Gamble M.D.   On: 06/21/2013 20:23   Ct Cervical Spine Wo Contrast  06/21/2013   CLINICAL DATA:  79 year old female status post fall onto concrete floor. Hematoma.  EXAM: CT HEAD WITHOUT CONTRAST  CT CERVICAL SPINE WITHOUT CONTRAST  TECHNIQUE: Multidetector CT imaging of the head and cervical spine was performed following the standard protocol without intravenous contrast. Multiplanar CT image reconstructions of the cervical spine were also generated.  COMPARISON:  09/06/2008.  FINDINGS: CT HEAD FINDINGS  Somewhat multifocal appearing scalp hematomas in the forehead (series 3, images 10 -12). Trace subcutaneous gas associated laterally compatible with scalp laceration. Underlying frontal bones  intact. Visualized paranasal sinuses and mastoids are clear. No calvarium fracture.  Stable orbits soft tissues. Calcified atherosclerosis at the skull base.  Cerebral volume has mildly decreased in a generalized fashion. No ventriculomegaly. Stable right frontal horn white matter hypodensity. Chronic lacunar infarct in the right cerebellum on image 12 is new since 2009. No midline shift, mass effect, or evidence of intracranial mass lesion. No acute intracranial hemorrhage identified. No evidence of cortically based acute infarction identified.  CT CERVICAL SPINE FINDINGS  Chronic straightening of cervical lordosis in stable mild anterolisthesis of C4 on C5. Associated facet degeneration at C4-C5 has increased.  Visualized skull base is intact. No atlanto-occipital dissociation. Cervicothoracic junction alignment is within normal limits. Bilateral posterior element alignment is within normal limits. No acute cervical spine fracture identified.  Emphysema in the lung apices. Calcified atherosclerosis. Visualized paraspinal soft tissues are within normal limits.  IMPRESSION: CT HEAD IMPRESSION  1. Scalp soft tissue injury without underlying fracture.  2. No acute traumatic injury to the brain. Mild for age chronic small vessel disease has mildly progressed since 2009.  CT CERVICAL SPINE IMPRESSION  No acute fracture or listhesis identified in the cervical spine. Ligamentous injury is not excluded.   Electronically Signed   By: Augusto Gamble M.D.   On: 06/21/2013 20:23   Dg Knee Complete 4 Views Left  06/21/2013   CLINICAL DATA:  History of fall complaining of bilateral knee pain.  EXAM: LEFT KNEE - COMPLETE 4+ VIEW  COMPARISON:  No priors.  FINDINGS: Four views  of the left knee demonstrate no acute displaced fracture, subluxation or dislocation. Chondrocalcinosis is noted. Numerous vascular calcifications are noted.  IMPRESSION: 1. No acute radiographic abnormality of the left knee. 2. Chondrocalcinosis. 3.  Atherosclerosis.   Electronically Signed   By: Trudie Reed M.D.   On: 06/21/2013 20:47   Dg Knee Complete 4 Views Right  06/21/2013   CLINICAL DATA:  History of fall complaining of bilateral knee pain.  EXAM: RIGHT KNEE - COMPLETE 4+ VIEW  COMPARISON:  No priors.  FINDINGS: Four views of the right knee demonstrate no definite acute displaced fracture, subluxation or dislocation. Chondrocalcinosis is noted. Numerous vascular calcifications are identified.  IMPRESSION: 1. No evidence of significant acute traumatic injury to the right knee. 2. Chondrocalcinosis. 3. Atherosclerosis.   Electronically Signed   By: Trudie Reed M.D.   On: 06/21/2013 20:48    MDM   1. Fall, initial encounter   2. Multiple contusions    Mechanical fall without loss of consciousness. On Plavix. Multiple abrasions to head, elbows and knees. Neurologically intact.  EKG unchanged. CT head negative for intracranial injury. CT C-spine negative.  extremity x-ray showed no fractures.  Patient is able to ambulate without assistance. Return precautions given including worsening swelling of her knee, increasing pain, paresthesias, cold sensation, weakness or numbness or tingling. She'll also return for worsening confusion, vomiting, any other concerns.   Date: 06/21/2013  Rate: 70  Rhythm: normal sinus rhythm  QRS Axis: normal  Intervals: PR prolonged  ST/T Wave abnormalities: nonspecific ST/T changes  Conduction Disutrbances:none  Narrative Interpretation:   Old EKG Reviewed: unchanged    Glynn Octave, MD 06/21/13 [id number]

## 2013-06-21 NOTE — ED Notes (Signed)
Bed: FA21 Expected date:  Expected time:  Means of arrival:  Comments: ems-falls-blood thinners

## 2013-06-21 NOTE — ED Notes (Signed)
Brought in by EMS from a restaurant after her mechanical fall tonight.  Per EMS, pt tripped and fell on a concrete floor, sustaining hematoma to head and right knee; pt denies loss of consciousness; pt able to stand on her feet. Pt presents to ED A/Ox4, no neuro deficits noted. Pt is on anticoagulant (Plavix).

## 2013-06-21 NOTE — ED Notes (Signed)
Pt ambulated to the restroom with minimal help. Pt is also drinking at this time

## 2013-06-28 ENCOUNTER — Encounter: Payer: Self-pay | Admitting: Family

## 2013-06-28 ENCOUNTER — Ambulatory Visit (INDEPENDENT_AMBULATORY_CARE_PROVIDER_SITE_OTHER): Payer: Medicare Other | Admitting: Family

## 2013-06-28 ENCOUNTER — Encounter: Payer: Self-pay | Admitting: Hematology & Oncology

## 2013-06-28 VITALS — BP 122/70 | HR 77 | Temp 97.8°F | Resp 16 | Ht 65.0 in | Wt 135.0 lb

## 2013-06-28 DIAGNOSIS — T07XXXA Unspecified multiple injuries, initial encounter: Secondary | ICD-10-CM

## 2013-06-28 DIAGNOSIS — W19XXXA Unspecified fall, initial encounter: Secondary | ICD-10-CM

## 2013-06-28 DIAGNOSIS — T148XXA Other injury of unspecified body region, initial encounter: Secondary | ICD-10-CM

## 2013-06-28 NOTE — Progress Notes (Signed)
Subjective:    Patient ID: Monique Fleming, female    DOB: November 21, 1928, 79 y.o.   MRN: [id number]  HPI  Monique Fleming is an 79 yr old female who presents today for ED follow up.  She was seen in the ED on 06/21/13.  She suffered a mechanical fall while walking on the sidewalk. She was wearing flip flops when this occurred.   Apparently landed on her knees and elbows.  ED notes are reviewed.  She underwent a CXR which showed no acute changes or infection.  L elbow x ray was negative for fracture, R elbow x ray neg for fracture.  CT head noted some soft tissue injury to the scalp without underlying fracture- no bleed.  CT C spine noted no acute fracture. L knee x ray and right knee x ray neg for fracture.    She presents today with her daughter who has been assisting her with her dressing changes.     Review of Systems See HPI  Past Medical History  Diagnosis Date  . Aortic stenosis     moderate by echo 6/11 gradient 20/46mmHg for mean and peak  . TIA (transient ischemic attack)     Dr. Pearlean Brownie  . Peripheral vascular disease   . IBS (irritable bowel syndrome)   . Diabetes mellitus     type II  . Chronic bronchitis   . COPD (chronic obstructive pulmonary disease)   . Atherosclerosis   . Depression   . Anxiety     Dr Donell Beers  . Renal cyst   . Anemia     iron deficiency  . Cellulitis   . Diverticular disease   . Infectious diarrhea(009.2)   . Sinusitis   . Thrush   . Abnormal liver function test   . GERD (gastroesophageal reflux disease)   . Insomnia     chronic  . Adenocarcinoma 2008    colon,s/p right hemicolectomy  . Angina   . Seizures     hx of one seizure "  . Cirrhosis of liver     hx of  . Clostridium difficile diarrhea   . Thrombocytopenia 04/01/2013    History   Social History  . Marital Status: Widowed    Spouse Name: N/A    Number of Children: N/A  . Years of Education: N/A   Occupational History  . retired    Social History Main Topics  . Smoking  status: Former Smoker -- 0.50 packs/day    Types: Cigarettes    Quit date: 03/29/2012  . Smokeless tobacco: Current User     Comment: Using Electronic cigarette  . Alcohol Use: No  . Drug Use: No  . Sexual Activity: No   Other Topics Concern  . Not on file   Social History Narrative   Patient signed a Designated Party Release to allow her daughter, Aasia Peavler, to have access to her medical records/information. Entered by Daphane Shepherd March 24,2011 @ 2:47 pm   Dailly Caffeine Use    Past Surgical History  Procedure Laterality Date  . Carotid endarterectomy    . Colectomy      right hemi  . Cholecystectomy  11/09    Rt Breast  . Upper gastrointestinal endoscopy      Family History  Problem Relation Age of Onset  . Colon cancer Paternal Grandmother   . Esophageal cancer Brother   . Heart disease Mother   . Diabetes Brother   . Hypertension Father   . Rectal cancer  Neg Hx   . Stomach cancer Neg Hx     Allergies  Allergen Reactions  . Bactrim [Sulfamethoxazole W-Trimethoprim] Shortness Of Breath  . Levofloxacin Other (See Comments)    seizure  . Pneumovax [Pneumococcal Polysaccharides] Other (See Comments)    Severe allergy-cellulitis  . Amoxicillin-Pot Clavulanate Other (See Comments)    Patient stated Drs. recommend taking Augmentin due to her PCN allergy.  . Penicillins Rash    unknown  . Haldol [Haloperidol Lactate]     Had unknown reaction many yrs ago per pt  . Morphine And Related     Confused and agitation  . Sudafed [Pseudoephedrine Hcl] Other (See Comments)    seizure    Current Outpatient Prescriptions on File Prior to Visit  Medication Sig Dispense Refill  . Cholecalciferol (VITAMIN D3) 1000 UNITS tablet Take 1,000 Units by mouth daily.        . cholestyramine (QUESTRAN) 4 G packet Take 1 packet twice daily for 30 days. Take 2 hours away from other meds. For cholesterol and itching.  60 each  2  . clonazePAM (KLONOPIN) 0.5 MG tablet Take on tablet  in the morning and up to 1 and 1/2 tablet (1.5  total) at bedtime as needed For sleep  75 tablet  1  . clopidogrel (PLAVIX) 75 MG tablet Take 1 tablet (75 mg total) by mouth daily.  90 tablet  3  . Cyanocobalamin (VITAMIN B-12) 500 MCG SUBL Place 1 tablet (500 mcg total) under the tongue 1 day or 1 dose.  90 tablet  3  . esomeprazole (NEXIUM) 40 MG capsule Take 40 mg by mouth 2 (two) times daily. Patient uses this medication for acid reflux.      . ferrous sulfate 325 (65 FE) MG tablet Take 1 tablet (325 mg total) by mouth daily with breakfast.  90 tablet  1  . Fluticasone-Salmeterol (ADVAIR DISKUS) 100-50 MCG/DOSE AEPB Inhale 1 puff into the lungs 2 (two) times daily.  60 each  11  . glucose blood test strip Use as instructed  100 each  3  . HYDROcodone-acetaminophen (NORCO/VICODIN) 5-325 MG per tablet Take 2 tablets by mouth every 4 (four) hours as needed for pain.  10 tablet  0  . phenytoin (DILANTIN) 100 MG ER capsule Take 2 capsules (200 mg total) by mouth at bedtime.  90 capsule  1  . potassium chloride (KLOR-CON) 10 MEQ CR tablet Take 20 mEq by mouth 2 (two) times daily.      Marland Kitchen gabapentin (NEURONTIN) 100 MG capsule Take 1 capsule (100 mg total) by mouth 3 (three) times daily.  90 capsule  5   No current facility-administered medications on file prior to visit.    BP 122/70  Pulse 77  Temp(Src) 97.8 F (36.6 C) (Oral)  Resp 16  Ht 5\' 5"  (1.651 m)  Wt 135 lb (61.236 kg)  BMI 22.47 kg/m2  SpO2 97%       Objective:   Physical Exam  Constitutional: She appears well-developed and well-nourished. No distress.  Cardiovascular: Normal rate and regular rhythm.   No murmur heard. Pulmonary/Chest: Effort normal and breath sounds normal. No respiratory distress. She has no wheezes. She has no rales. She exhibits no tenderness.  Neurological:  Hard of hearing.   Skin: Skin is warm and dry.  + ecchymosis noted right upper arm, beneath right buttock.  Clean wound noted on right knee/left  elbow and right forearm (steristrips clean dry and intact- these wounds were cleansed and  redressed today).           Assessment & Plan:

## 2013-06-28 NOTE — Patient Instructions (Addendum)
You will be contacted about your home health physical therapy referral. Continue daily dressing changes.   Follow up with Dr. Abner Greenspan in 3 months.

## 2013-06-28 NOTE — Assessment & Plan Note (Signed)
Improving, recommended continued wound care.  No sign of infection.  Given recent fall, I have recommended a HH PT evaluation for safety.  She is agreeable to this referral. Declines flu shot due to hx of pneumovax reaction.

## 2013-06-29 ENCOUNTER — Telehealth: Payer: Self-pay | Admitting: Internal Medicine

## 2013-06-29 NOTE — Telephone Encounter (Signed)
Appt moved to Thursday 07/06/13 at 4pm. Pt aware and nothing more needed.

## 2013-06-30 ENCOUNTER — Ambulatory Visit (HOSPITAL_BASED_OUTPATIENT_CLINIC_OR_DEPARTMENT_OTHER)
Admission: RE | Admit: 2013-06-30 | Discharge: 2013-06-30 | Disposition: A | Discharge status: Home / Self Care | Payer: Medicare Other | Source: Ambulatory Visit | Attending: Hematology & Oncology | Admitting: Hematology & Oncology

## 2013-06-30 DIAGNOSIS — C189 Malignant neoplasm of colon, unspecified: Secondary | ICD-10-CM | POA: Insufficient documentation

## 2013-06-30 DIAGNOSIS — R634 Abnormal weight loss: Secondary | ICD-10-CM | POA: Insufficient documentation

## 2013-06-30 DIAGNOSIS — K769 Liver disease, unspecified: Secondary | ICD-10-CM | POA: Insufficient documentation

## 2013-06-30 DIAGNOSIS — D485 Neoplasm of uncertain behavior of skin: Secondary | ICD-10-CM | POA: Insufficient documentation

## 2013-06-30 DIAGNOSIS — D509 Iron deficiency anemia, unspecified: Secondary | ICD-10-CM | POA: Insufficient documentation

## 2013-06-30 MED ORDER — IOHEXOL 300 MG/ML  SOLN: 100.0000 mL | Freq: Once | INTRAMUSCULAR | Status: DC | PRN

## 2013-06-30 MED ORDER — IOHEXOL 300 MG/ML  SOLN
100.0000 mL | Freq: Once | INTRAMUSCULAR | Status: AC | PRN
  Administered 2013-06-30: 100 mL via INTRAVENOUS

## 2013-07-03 ENCOUNTER — Ambulatory Visit: Payer: Medicare Other | Admitting: Internal Medicine

## 2013-07-04 ENCOUNTER — Telehealth: Payer: Self-pay | Admitting: Oncology

## 2013-07-04 ENCOUNTER — Telehealth: Payer: Self-pay | Admitting: Hematology & Oncology

## 2013-07-04 NOTE — Telephone Encounter (Addendum)
Message copied by Lacie Draft on Tue Jul 04, 2013  3:32 PM ------      Message from: Monique Fleming      Created: Sun Jul 02, 2013  8:22 PM       Call - no cancer.  Cindee Lame ------Spoke with patient, is coming for appt tomorrow.

## 2013-07-04 NOTE — Telephone Encounter (Signed)
Patient's daughter [name] called and cx 07/05/13 apt and did not resch.

## 2013-07-05 ENCOUNTER — Encounter: Payer: Self-pay | Admitting: Family

## 2013-07-05 ENCOUNTER — Other Ambulatory Visit: Payer: Medicare Other | Admitting: Lab

## 2013-07-05 ENCOUNTER — Ambulatory Visit: Payer: Medicare Other | Admitting: Hematology & Oncology

## 2013-07-06 ENCOUNTER — Ambulatory Visit: Payer: Medicare Other | Admitting: Internal Medicine

## 2013-07-07 ENCOUNTER — Ambulatory Visit (INDEPENDENT_AMBULATORY_CARE_PROVIDER_SITE_OTHER): Payer: Medicare Other | Admitting: Internal Medicine

## 2013-07-07 ENCOUNTER — Encounter: Payer: Self-pay | Admitting: Internal Medicine

## 2013-07-07 VITALS — BP 118/80 | HR 80 | Ht 65.5 in | Wt 135.0 lb

## 2013-07-07 DIAGNOSIS — J449 Chronic obstructive pulmonary disease, unspecified: Secondary | ICD-10-CM

## 2013-07-07 DIAGNOSIS — Z23 Encounter for immunization: Secondary | ICD-10-CM

## 2013-07-07 MED ORDER — FLUTICASONE-SALMETEROL 100-50 MCG/DOSE IN AEPB: 1.0000 | INHALATION_SPRAY | Freq: Two times a day (BID) | RESPIRATORY_TRACT | Status: DC

## 2013-07-07 NOTE — Patient Instructions (Signed)
Flu vax  Sample and script Advair 100   1 puff then rinse mouth, twice daily     This is a three month prescription, with refills for a year.

## 2013-07-07 NOTE — Progress Notes (Signed)
Patient ID: Monique Fleming, female    DOB: [birth year], 79 y.o.   MRN: [id number]  HPI 38 yoF former smoker with COPD and multiple medical problems.  Last here November 05, 2010. Since then she reports staying off cigarettes and having no significant respiratory events. Pollen bothered more in March with nasal congestion, postnasal drip, but not much wheeze or cough. She feels current meds are adequate.  04/07/11- 81 yoF current smoker with COPD complicated by DM, ASCVD, hx colon cancer, anemia., GERD, esophageal varices. Was hosp for red, inflamed arm after Pneumovax with concern then that she had cellulits. Has had UTI then yeast from antibiotics- to see her GYN Has indigestion with known liver disease and esophageal varices.  Doesn't describe active respiratory complaints- mild occasional cough, dyspnea with exertion unchanged. We talked again about smoking and tried to motivate to full cessation. Smokes a few daily.  06/09/11- 3 yoF current smoker with COPD complicated by DM, ASCVD, hx colon cancer, anemia., GERD, esophageal varices. Reports doing well. Discussed flu shot after her sore arm from the pneumovax, and decided they were different enough that there should not be a similar reaction noted with flu vax.  She has not had significant recent cough, wheeze, phlegm, chest pain or palpitation.   08/11/11-  37 yoF current smoker with COPD complicated by DM, ASCVD, hx colon cancer, anemia., GERD, esophageal varices. She described her problems with bladder infection and C. difficile colitis requiring antibiotics almost continuously for the last 5 months. Her breathing has not changed with weather and season. She gets some raspy cough occasionally but rarely produces sputum. She denies chest pain or palpitation. She is still smoking an occasional cigarette I believe, but much less than before.  10/13/11- 36 yoF current smoker with COPD complicated by DM, ASCVD, hx colon cancer, anemia., GERD,  esophageal varices. Had a viral bronchitis syndrome, largely resolved now.  12/11/11- 82 yoF current smoker with COPD complicated by DM, ASCVD, hx colon cancer, anemia., GERD, esophageal varices Pharmacist suggested she blunt stimulation from  xopenex neb solution by diluting with a little water. She isn't sure the neb makes that much difference. Asks refill Symbicort. Reviewed her complex medical problem list. Not having acute respiratory symptoms now.  02/09/12- 13 yoF former smoker with COPD complicated by DM, ASCVD, hx colon cancer, anemia., GERD, esophageal varices  Pt states breathing is the same, no better or worse since last visit  She assures me that she is not smoking. Breathing feels comfortable with little routine cough or wheeze. Uses Symbicort twice daily. She has not been using her nebulizer machine. CXR 08/27/11 reviewed w/ her IMPRESSION:  Emphysematous changes and mild left lower lobe opacity, likely  scarring or atelectasis. No acute cardiopulmonary process  identified.  Original Report Authenticated By: Waneta Martins, M.D.   03/31/12- ER visit [facility]: HPI  Patient is an 79 year old female with a history of multiple medical problems including COPD and continued smoking who presents today complaining of shortness of breath. Patient reports that this began last night. 2 days ago she was seen by her primary care provider and diagnosed with a urinary tract infection. She was started on Bactrim for this. Patient feels that her symptoms began after taking this medication at home last night. Initially the patient is noted to be wheezing she reports that she always wheezes. Patient has numerous drug allergies and feels that her symptoms are likely a drug reaction. Patient had fingerstick of 177 upon  arrival. She was afebrile and hemodynamically stable. She denied any chest pain and has no history of coronary artery disease despite all of her other medical problems. She denies  fevers but does endorse some cough. Patient has no history of DVT or PE. She denies history of heart failure. Patient is not currently on any anticoagulation for any reason. She continues to smoke 7-8 cigarettes a day. Exertion appears to make her shortness of breath worse and nothing makes it better. There are no other associated or modifying factors.    04/05/12- 40 yoF former smoker with COPD complicated by DM, ASCVD, hx colon cancer, anemia., GERD, esophageal varices At ER - see note above: COPD exacerb, UTI. Small LUL nodules seemed larger>CT.   Daughter Monique Fleming  here Unfortunately she has admitted still smoking a few cigarettes daily against all our counseling.  COPD assessment test (CAT) 23/40 Emergency room visit June 27 for COPD exacerbation and UTI. She understood chest x-ray showed "nodules". When she called about it we ordered CT. She still feels tight on a prednisone taper ending today. Was on sulfa for UTI- changed to keflex.  CT chest 04/05/12- reviewed images and report with them IMPRESSION:  1. No acute findings.  2. Emphysema.  3. No pathologic adenopathy within the mediastinum or hilar  regions.  4. Prominent coronary artery calcifications  5. Cirrhosis.  Original Report Authenticated By: Rosealee Albee, M.D.   05/16/12-82 yoF former smoker with COPD complicated by DM, ASCVD, hx colon cancer, anemia., GERD, esophageal varices Denies any SOB, wheezing, cough, or congestion at this time. COPD assessment test (CAT) 15/40 Definitely feels better than last visit   08/04/12- 82 yoF former smoker with COPD complicated by DM, ASCVD, hx colon cancer, anemia., GERD, esophageal varices Breathing about the same as last visit; had few nose bleeds last week. Epistaxis needed packing because of Plavix. COPD assessment test (CAT) score 22/40  10/06/12- 83 yoF former smoker with COPD complicated by DM, ASCVD, hx colon cancer, anemia., GERD, esophageal varices FOLLOWS FOR: slight wheezing,  SOB with activity mostly, cough slight-productive a times-white in color. She had quit smoking around June of 2013. Says she feels "pretty good" with no acute respiratory problems, no recent colds. Little change in chronic light cough with occasional white sputum. Denies chest pain, swollen glands, fever. Rarely needs her nebulizer.  02/03/13- 68 yoF former smoker with COPD complicated by DM, ASCVD, hx colon cancer, anemia., GERD, esophageal varices FOLLOWS FOR: states she is having harder time breathing due to allergy flare up; nasal drainage and sneezing. Itchy, watery eyes as well. We discussed management with antihistamines Otherwise breathing is really not substantially affect. Dyspnea with exertion doing housework. Little cough or phlegm now. She does not hear herself wheeze.  05/01/13-  57 yoF former smoker with COPD complicated by DM, ASCVD, hx colon cancer, anemia., GERD, esophageal varices FOLLOWS FOR: pt reports breathing is about the same since last visit-- pt just received advair so has not had a chance to see if it has helped-- denies any other concerns at this time Insurance wouldn't cover Symbicort, so we switched to Advair. Using E-cigarette. Slight  Occasional cough with little phlegm and no significant events. CXR 02/06/13 IMPRESSION:  Stable changes of chronic obstructive lung disease. No acute  cardiopulmonary process.  Original Report Authenticated By: Carey Bullocks, M.D.  07/07/13- 12 yoF former smoker with COPD complicated by DM, ASCVD, hx colon cancer, anemia., GERD, esophageal varices FOLLOWS FOR: SOB and wheezing pretty bad if  exerting her self or gets upset. Pt states that her copay is $17 for Advair and needs Rx for this for 90 days(so she can see if cheaper through the mail). Had fallen yesterday, scraping her knees, so she rescheduled visit until today. A little more short of breath last day or 2 without a cold or increased wheeze. We reviewed her chest CT results. CT  chest/abd 06/30/13 IMPRESSION:  CT CHEST IMPRESSION  1. No evidence for metastatic disease to the chest.  2. Emphysema.  3. Coronary artery calcifications.  CT ABDOMEN AND PELVIS IMPRESSION  1. No evidence for metastatic disease to the abdomen or pelvis.  2. Morphologic features of the liver compatible with cirrhosis and  portal venous hypertension.   Review of Systems-See HPI Constitutional:   No weight loss, night sweats,  Fevers, chills,  +fatigue, lassitude. HEENT:   No headaches,  Difficulty swallowing,  Tooth/dental problems,  Sore throat,                No-sneezing, no-itching, ear ache, no-nasal congestion, +post nasal drip,  CV:  No chest pain, orthopnea, PND, swelling in lower extremities, anasarca, dizziness, palpitations GI  No heartburn, indigestion, abdominal pain, nausea, vomiting,  Resp: + shortness of breath with routine exertion or at rest.  No excess mucus,   No coughing up of blood.  No change in color of mucus.  Skin: no rash or lesions. GU: MS:  No joint pain or swelling.  Marland Kitchen Psych:  No change in mood or affect. + depression or anxiety.  No memory loss.  Objective:   Physical Exam  General- Alert, Oriented, Affect-appropriate, Distress- none acute; calm  Skin- rash-none, lesions- none, excoriation- none. Dry.  Lymphadenopathy- none Head- atraumatic            Eyes- Gross vision intact, PERRLA, conjunctivae clear secretions            Ears- Hearing-diminished            Nose- Clear, no-Septal dev, mucus, polyps, erosion, perforation             Throat- Mallampati II , mucosa clear , drainage- none, tonsils- atrophic, +dentures  Neck- flexible , trachea midline, no stridor , thyroid nl, carotid no bruit Chest - symmetrical excursion , unlabored           Heart/CV- RRR , 1-2/6 systolic precordial murmur AS , no gallop  , no rub, nl s1 s2                           - JVD- none , edema- none, stasis changes- none, varices- none           Lung-  +distant, slight  cough, + trace crackles bases , dullness-none, rub- none. +Breathless speech pattern with long sentences -seems chronic.           Chest wall-  Abd- Br/ Gen/ Rectal- Not done, not indicated Extrem- cyanosis- none, clubbing, none, atrophy- none, strength- nl Neuro- grossly intact to observation

## 2013-07-17 ENCOUNTER — Encounter: Payer: Self-pay | Admitting: Family

## 2013-07-17 NOTE — Telephone Encounter (Signed)
Monique Fleming- could you please call [facility] and check status of referral?  The order was placed 2.5 weeks ago. Needs eval ASAP please.

## 2013-07-17 NOTE — Assessment & Plan Note (Signed)
Without exacerbation, but dyspnea with exertion. I'm not sure she is any different. Advair seems to help. Plan-flu vaccine

## 2013-07-18 ENCOUNTER — Telehealth: Payer: Self-pay | Admitting: Family

## 2013-07-18 NOTE — Telephone Encounter (Signed)
Opened in error

## 2013-07-24 ENCOUNTER — Encounter: Payer: Self-pay | Admitting: Nurse Practitioner

## 2013-07-24 ENCOUNTER — Ambulatory Visit (INDEPENDENT_AMBULATORY_CARE_PROVIDER_SITE_OTHER): Payer: Medicare Other | Admitting: Nurse Practitioner

## 2013-07-24 VITALS — BP 159/77 | HR 79 | Temp 98.5°F | Ht 65.0 in | Wt 131.0 lb

## 2013-07-24 DIAGNOSIS — I658 Occlusion and stenosis of other precerebral arteries: Secondary | ICD-10-CM

## 2013-07-24 DIAGNOSIS — I6523 Occlusion and stenosis of bilateral carotid arteries: Secondary | ICD-10-CM

## 2013-07-24 DIAGNOSIS — I359 Nonrheumatic aortic valve disorder, unspecified: Secondary | ICD-10-CM

## 2013-07-24 DIAGNOSIS — I7090 Unspecified atherosclerosis: Secondary | ICD-10-CM

## 2013-07-24 DIAGNOSIS — I350 Nonrheumatic aortic (valve) stenosis: Secondary | ICD-10-CM

## 2013-07-24 DIAGNOSIS — I6529 Occlusion and stenosis of unspecified carotid artery: Secondary | ICD-10-CM

## 2013-07-24 NOTE — Progress Notes (Deleted)
Subjective:    Patient ID: Monique Fleming is a 79 y.o. female.  HPI {Common ambulatory SmartLinks:[id number]}  Review of Systems  Constitutional: Positive for fatigue and unexpected weight change (loss).  HENT: Positive for hearing loss and tinnitus.   Respiratory: Positive for cough and shortness of breath.   Gastrointestinal: Positive for diarrhea and blood in stool.  Genitourinary: Positive for difficulty urinating.  Neurological: Positive for tremors (Mostly in left hand - just starting in right hand).  Hematological: Bruises/bleeds easily.       Anemia    Objective:  Neurologic Exam  Physical Exam  Assessment:   ***  Plan:   ***

## 2013-07-24 NOTE — Progress Notes (Signed)
[facility]  PATIENT: Monique Fleming DOB: 1929/02/18   REASON FOR VISIT: follow up HISTORY FROM: patient  HISTORY OF PRESENT ILLNESS: Monique Fleming is a 79 year old Caucasian female with history of stroke, mild cognitive impairment, depression, aortic stenosis and remote history of seizure. She continues to do well without any recurrent seizure or significant worsening of memory. She does crossword puzzles to keep her so busy and cognitively challenging tasks. She states in the last 2 years since her husband has died and she has been this interested in activities with little energy but she has 2 daughters to check on her regularly. She lives alone in a condominium and is still driving. She has not been seen in the office since November 2012.  She comes in the office today for new problem of right neck pain. She states she has history of stenosis and bilateral carotid arteries with endarterectomy on the left. She feels that neck pain is 2 to blockage on the right carotid. Her last carotid duplex was January 2013, which showed 50-69% stenosis of the left distal CCA and significant stenotic flow in the right ECA. Heterogeneous and acoustic shadowing I asked were observed bilaterally. Transcranial Doppler studies from January 2013 was suggestive of diffuse atherosclerosis. She states that the pain is intermittent but every day. She states the pain is sharp and burning in nature only on the right side starting at the base of her neck and shooting up behind the right ear. She states the pain is intense it makes her clench her teeth. She states she has found nothing to make the pain better. She denies any recent injury or muscle strain from lifting.   UPDATE 07/24/13 (LL):  Monique Fleming comes in for revisit for neck pain.  She had a repeat carotid doppler study which shows bilateral 50-69% stenosis.  Her neck pain has since resolved.  She tells me that she has quit smoking but the room  smells strongly of cigarettes.  She reports that Dr. Myna Hidalgo was worried about her weight loss at last check up and had ordered a CT chest and pelvis, which was negative for metastatic disease.  She also reports that she had a fall in September on gravel in which she had a laceration over her left eye and many scrapes and bruises, but no fractures.  Review of Systems  Constitutional: Positive for fatigue and unexpected weight change (loss).  HENT: Positive for hearing loss and tinnitus.   Respiratory: Positive for cough and shortness of breath.   Gastrointestinal: Positive for diarrhea and blood in stool.  Genitourinary: Positive for difficulty urinating.  Neurological: Positive for tremors (Mostly in left hand - just starting in right hand).  Hematological: Bruises/bleeds easily.       Anemia    ALLERGIES: Allergies  Allergen Reactions  . Bactrim [Sulfamethoxazole-Trimethoprim] Shortness Of Breath  . Levofloxacin Other (See Comments)    seizure  . Pneumovax [Pneumococcal Polysaccharides] Other (See Comments)    Severe allergy-cellulitis  . Amoxicillin-Pot Clavulanate Other (See Comments)    Patient stated Drs. recommend taking Augmentin due to her PCN allergy.  . Penicillins Rash    unknown  . Haldol [Haloperidol Lactate]     Had unknown reaction many yrs ago per pt  . Morphine And Related     Confused and agitation  . Sudafed [Pseudoephedrine Hcl] Other (See Comments)    seizure    HOME MEDICATIONS: Outpatient Prescriptions Prior to Visit  Medication Sig Dispense  Refill  . Cholecalciferol (VITAMIN D3) 1000 UNITS tablet Take 1,000 Units by mouth daily.        . cholestyramine (QUESTRAN) 4 G packet Take 1 packet twice daily for 30 days. Take 2 hours away from other meds. For cholesterol and itching.  60 each  2  . clonazePAM (KLONOPIN) 0.5 MG tablet Take on tablet in the morning and up to 1 and 1/2 tablet (1.5  total) at bedtime as needed For sleep  75 tablet  1  . clopidogrel  (PLAVIX) 75 MG tablet Take 1 tablet (75 mg total) by mouth daily.  90 tablet  3  . Cyanocobalamin (VITAMIN B-12) 500 MCG SUBL Place 1 tablet (500 mcg total) under the tongue 1 day or 1 dose.  90 tablet  3  . ferrous sulfate 325 (65 FE) MG tablet Take 1 tablet (325 mg total) by mouth daily with breakfast.  90 tablet  1  . Fluticasone-Salmeterol (ADVAIR DISKUS) 100-50 MCG/DOSE AEPB Inhale 1 puff into the lungs 2 (two) times daily.  180 each  3  . gabapentin (NEURONTIN) 100 MG capsule Take 1 capsule (100 mg total) by mouth 3 (three) times daily.  90 capsule  5  . glucose blood test strip Use as instructed  100 each  3  . phenytoin (DILANTIN) 100 MG ER capsule Take 2 capsules (200 mg total) by mouth at bedtime.  90 capsule  1  . potassium chloride (KLOR-CON) 10 MEQ CR tablet Take 20 mEq by mouth 2 (two) times daily.      Marland Kitchen HYDROcodone-acetaminophen (NORCO/VICODIN) 5-325 MG per tablet Take 2 tablets by mouth every 4 (four) hours as needed for pain.  10 tablet  0  . esomeprazole (NEXIUM) 40 MG capsule Take 40 mg by mouth 2 (two) times daily. Patient uses this medication for acid reflux.      . Fluticasone-Salmeterol (ADVAIR DISKUS) 100-50 MCG/DOSE AEPB Inhale 1 puff into the lungs 2 (two) times daily. RINSE MOUTH AFTER USE  1 each  0   No facility-administered medications prior to visit.    PAST MEDICAL HISTORY: Past Medical History  Diagnosis Date  . Aortic stenosis     moderate by echo 6/11 gradient 20/9mmHg for mean and peak  . TIA (transient ischemic attack)     Dr. Pearlean Brownie  . Peripheral vascular disease   . IBS (irritable bowel syndrome)   . Diabetes mellitus     type II  . Chronic bronchitis   . COPD (chronic obstructive pulmonary disease)   . Atherosclerosis   . Depression   . Anxiety     Dr Donell Beers  . Renal cyst   . Anemia     iron deficiency  . Cellulitis   . Diverticular disease   . Infectious diarrhea(009.2)   . Sinusitis   . Thrush   . Abnormal liver function test   .  GERD (gastroesophageal reflux disease)   . Insomnia     chronic  . Adenocarcinoma 2008    colon,s/p right hemicolectomy  . Angina   . Seizures     hx of one seizure "  . Cirrhosis of liver     hx of  . Clostridium difficile diarrhea   . Thrombocytopenia 04/01/2013    PAST SURGICAL HISTORY: Past Surgical History  Procedure Laterality Date  . Carotid endarterectomy    . Colectomy      right hemi  . Cholecystectomy  11/09    Rt Breast  . Upper gastrointestinal endoscopy  FAMILY HISTORY: Family History  Problem Relation Age of Onset  . Colon cancer Paternal Grandmother   . Esophageal cancer Brother   . Heart disease Mother   . Diabetes Brother   . Hypertension Father   . Rectal cancer Neg Hx   . Stomach cancer Neg Hx     SOCIAL HISTORY: History   Social History  . Marital Status: Widowed    Spouse Name: N/A    Number of Children: N/A  . Years of Education: N/A   Occupational History  . retired    Social History Main Topics  . Smoking status: Former Smoker -- 0.50 packs/day    Types: Cigarettes    Quit date: 03/29/2012  . Smokeless tobacco: Current User     Comment: Using Electronic cigarette  . Alcohol Use: No  . Drug Use: No  . Sexual Activity: No   Other Topics Concern  . Not on file   Social History Narrative   Patient signed a Designated Party Release to allow her daughter, Kourtnie Sachs, to have access to her medical records/information. Entered by Daphane Shepherd March 24,2011 @ 2:47 pm   Dailly Caffeine Use     PHYSICAL EXAM  Filed Vitals:   07/24/13 1403  BP: 159/77  Pulse: 79  Temp: 98.5 F (36.9 C)  TempSrc: Oral  Height: 5\' 5"  (1.651 m)  Weight: 131 lb (59.421 kg)   Body mass index is 21.8 kg/(m^2).  Generalized: In no acute distress, pleasant elderly Caucasian female  Neck: Supple, bilateral carotid bruits vs. Aortic stenosis  Cardiac: Regular rate rhythm, 3/6 murmur  Pulmonary: Expiratory wheezes  Musculoskeletal: No  deformity   Neurological examination  Mentation: Alert oriented to time, place, history taking, language fluent, and casual conversation  Cranial nerve II-XII: Pupils were equal round reactive to light extraocular movements were full, visual field were full on confrontational test. facial sensation and strength were normal. HEARING DECREASED bilaterally. Uvula tongue midline. head turning and shoulder shrug and were normal and symmetric.  MOTOR: normal bulk and tone, full strength in the BUE, BLE, fine finger movements normal, no pronator drift  SENSORY: normal and symmetric to light touch, pinprick, temperature, vibration  COORDINATION: finger-nose-finger, heel-to-shin bilaterally, there was no truncal ataxia  REFLEXES: DTRs symmetric and equal bilaterally.  GAIT/STATION: Rising up from seated position without assistance, normal stance, without trunk ataxia, moderate stride, good arm swing, smooth turning, able to perform tiptoe, and heel walking without difficulty. TANDEM UNSTEADY.   DIAGNOSTIC DATA (LABS, IMAGING, TESTING) - I reviewed patient records, labs, notes, testing and imaging myself where available.  Lab Results  Component Value Date   WBC 7.5 06/21/2013   HGB 12.6 06/21/2013   HCT 37.5 06/21/2013   MCV 84.1 06/21/2013   PLT 139* 06/21/2013      Component Value Date/Time   NA 137 06/21/2013 1940   NA 141 06/15/2013 1458   K 3.8 06/21/2013 1940   K 4.3 06/15/2013 1458   CL 102 06/21/2013 1940   CL 104 06/15/2013 1458   CO2 24 06/21/2013 1940   CO2 27 06/15/2013 1458   GLUCOSE 147* 06/21/2013 1940   GLUCOSE 179* 06/15/2013 1458   BUN 11 06/21/2013 1940   BUN 10 06/15/2013 1458   CREATININE 0.74 06/21/2013 1940   CREATININE 0.7 06/15/2013 1458   CALCIUM 9.0 06/21/2013 1940   CALCIUM 8.9 06/15/2013 1458   PROT 7.5 06/21/2013 1940   PROT 8.0 06/27/2010 0809   ALBUMIN 3.0* 06/21/2013 1940  AST 39* 06/21/2013 1940   AST 52* 06/27/2010 0809   ALT 13 06/21/2013 1940   ALT 26 06/27/2010 0809    ALKPHOS 107 06/21/2013 1940   ALKPHOS 74 06/27/2010 0809   BILITOT 0.3 06/21/2013 1940   BILITOT 0.70 06/27/2010 0809   GFRNONAA 78* 06/21/2013 1940   GFRAA >90 06/21/2013 1940   Lab Results  Component Value Date   CHOL 130 08/10/2012   HDL 22* 08/10/2012   LDLCALC 76 08/10/2012   LDLDIRECT 124.3 03/20/2010   TRIG 159* 08/10/2012   CHOLHDL 5.9 08/10/2012   Lab Results  Component Value Date   HGBA1C 5.9* 01/23/2013   Lab Results  Component Value Date   VITAMINB12 420 10/20/2011   Lab Results  Component Value Date   TSH 2.076 03/06/2013   CAROTID DUPLEX STUDY 10/07/11  The study is consistent with 50-69% stenosis of the left distal CCA and significant stenotic flow in the right ECA. Heterogeneous and acoustic shadow plaques were observed bilaterally.  TCD 10/07/11  This limited study is consistent with diminished CBFV pattern seen in the right MCA M2. The cause and clinical significance of this finding is unclear. In addition, abnormally elevated CBFVs in the right/left OA's and VA's, but without TCD signs of stenosis. Elevated the eyes are suggestive of diffuse atherosclerosis.  04/19/13 2D ECHO The estimated ejection fraction was in the range of 65% to 70%. Doppler parameters are consistent with abnormal left ventricular relaxation (grade 1 diastolic dysfunction). 06/14/13 CT CHEST IMPRESSION  No evidence for metastatic disease to the chest. Emphysema. Coronary artery calcifications.  06/14/13 CT ABDOMEN AND PELVIS IMPRESSION  No evidence for metastatic disease to the abdomen or pelvis. Morphologic features of the liver compatible with cirrhosis and portal venous hypertension. 06/21/13 CT HEAD WITHOUT CONTRAST  Scalp soft tissue injury without underlying fracture.    No acute traumatic injury to the brain.  Mild for age chronic small vessel disease has mildly progressed since 2009.  06/21/13  CT CERVICAL SPINE IMPRESSION  No acute fracture or listhesis identified in the cervical spine.   Ligamentous injury is not excluded.  ASSESSMENT AND PLAN Monique Fleming is a 79 year old Caucasian female with history of stroke, mild cognitive impairment, depression, aortic stenosis, carotid stenosis and remote history of seizure.  Bilateral ICA stenosis, 50-69%.   PLAN:   Continue clopidogrel 75 mg orally every day  for secondary stroke prevention and maintain strict control of hypertension with blood pressure goal below 130/90, diabetes with hemoglobin A1c goal below 6.5% and lipids with LDL cholesterol goal below 100 mg/dL.  Repeat Carotid doppler study in 6 months. Follow up in 6 months, sooner as needed.   Ronal Fear, MSN, NP-C 07/24/2013, 4:54 PM [facility] [address] [place], [place] [address] [phone]

## 2013-07-24 NOTE — Patient Instructions (Signed)
We would like to see you in 6 months to repeat the Carotid Doppler Study.

## 2013-08-01 ENCOUNTER — Telehealth: Payer: Self-pay | Admitting: Family Medicine

## 2013-08-01 NOTE — Telephone Encounter (Signed)
Patient states at last visit she had requested getting a stool to use in her shower. She would like to proceed with getting this.

## 2013-08-03 ENCOUNTER — Telehealth: Payer: Self-pay | Admitting: Family Medicine

## 2013-08-03 MED ORDER — CLOPIDOGREL BISULFATE 75 MG PO TABS: 75.0000 mg | ORAL_TABLET | Freq: Every day | ORAL | Status: DC

## 2013-08-03 MED ORDER — POTASSIUM CHLORIDE ER 10 MEQ PO TBCR: 10.0000 meq | EXTENDED_RELEASE_TABLET | Freq: Two times a day (BID) | ORAL | Status: DC

## 2013-08-03 NOTE — Telephone Encounter (Signed)
Please advise 

## 2013-08-03 NOTE — Telephone Encounter (Signed)
Yeah she can get this inexpensively at a medical supply store. She can go pick one out and I can write a prescription for the one she chooses. I believe some [facility] will also bring them out but then she will not have as much choice and be sure it will sit in her shower. Please see what she wants to do

## 2013-08-03 NOTE — Telephone Encounter (Signed)
Patient needs Plavix and Potassium Chloride called to [facility]  Has been getting mail order

## 2013-08-14 NOTE — Telephone Encounter (Signed)
Patient informed, understood & agreed; she will let us know when ready/SLS

## 2013-09-07 ENCOUNTER — Ambulatory Visit (INDEPENDENT_AMBULATORY_CARE_PROVIDER_SITE_OTHER): Payer: Medicare Other | Admitting: Internal Medicine

## 2013-09-07 ENCOUNTER — Encounter: Payer: Self-pay | Admitting: Internal Medicine

## 2013-09-07 VITALS — BP 120/78 | HR 96 | Ht 65.5 in | Wt 128.2 lb

## 2013-09-07 DIAGNOSIS — J449 Chronic obstructive pulmonary disease, unspecified: Secondary | ICD-10-CM

## 2013-09-07 DIAGNOSIS — J4489 Other specified chronic obstructive pulmonary disease: Secondary | ICD-10-CM

## 2013-09-07 NOTE — Patient Instructions (Signed)
Please call as needed 

## 2013-09-07 NOTE — Progress Notes (Signed)
Patient ID: Monique Fleming, female    DOB: 12-Aug-1929, 79 y.o.   MRN: [id number]  HPI 74 yoF former smoker with COPD and multiple medical problems.  Last here November 05, 2010. Since then she reports staying off cigarettes and having no significant respiratory events. Pollen bothered more in March with nasal congestion, postnasal drip, but not much wheeze or cough. She feels current meds are adequate.  04/07/11- 81 yoF current smoker with COPD complicated by DM, ASCVD, hx colon cancer, anemia., GERD, esophageal varices. Was hosp for red, inflamed arm after Pneumovax with concern then that she had cellulits. Has had UTI then yeast from antibiotics- to see her GYN Has indigestion with known liver disease and esophageal varices.  Doesn't describe active respiratory complaints- mild occasional cough, dyspnea with exertion unchanged. We talked again about smoking and tried to motivate to full cessation. Smokes a few daily.  06/09/11- 75 yoF current smoker with COPD complicated by DM, ASCVD, hx colon cancer, anemia., GERD, esophageal varices. Reports doing well. Discussed flu shot after her sore arm from the pneumovax, and decided they were different enough that there should not be a similar reaction noted with flu vax.  She has not had significant recent cough, wheeze, phlegm, chest pain or palpitation.   08/11/11-  4 yoF current smoker with COPD complicated by DM, ASCVD, hx colon cancer, anemia., GERD, esophageal varices. She described her problems with bladder infection and C. difficile colitis requiring antibiotics almost continuously for the last 5 months. Her breathing has not changed with weather and season. She gets some raspy cough occasionally but rarely produces sputum. She denies chest pain or palpitation. She is still smoking an occasional cigarette I believe, but much less than before.  10/13/11- 50 yoF current smoker with COPD complicated by DM, ASCVD, hx colon cancer, anemia., GERD,  esophageal varices. Had a viral bronchitis syndrome, largely resolved now.  12/11/11- 82 yoF current smoker with COPD complicated by DM, ASCVD, hx colon cancer, anemia., GERD, esophageal varices Pharmacist suggested she blunt stimulation from  xopenex neb solution by diluting with a little water. She isn't sure the neb makes that much difference. Asks refill Symbicort. Reviewed her complex medical problem list. Not having acute respiratory symptoms now.  02/09/12- 46 yoF former smoker with COPD complicated by DM, ASCVD, hx colon cancer, anemia., GERD, esophageal varices  Pt states breathing is the same, no better or worse since last visit  She assures me that she is not smoking. Breathing feels comfortable with little routine cough or wheeze. Uses Symbicort twice daily. She has not been using her nebulizer machine. CXR 08/27/11 reviewed w/ her IMPRESSION:  Emphysematous changes and mild left lower lobe opacity, likely  scarring or atelectasis. No acute cardiopulmonary process  identified.  Original Report Authenticated By: Waneta Martins, M.D.   03/31/12- ER visit [facility]: HPI  Patient is an 79 year old female with a history of multiple medical problems including COPD and continued smoking who presents today complaining of shortness of breath. Patient reports that this began last night. 2 days ago she was seen by her primary care provider and diagnosed with a urinary tract infection. She was started on Bactrim for this. Patient feels that her symptoms began after taking this medication at home last night. Initially the patient is noted to be wheezing she reports that she always wheezes. Patient has numerous drug allergies and feels that her symptoms are likely a drug reaction. Patient had fingerstick of 177 upon  arrival. She was afebrile and hemodynamically stable. She denied any chest pain and has no history of coronary artery disease despite all of her other medical problems. She denies  fevers but does endorse some cough. Patient has no history of DVT or PE. She denies history of heart failure. Patient is not currently on any anticoagulation for any reason. She continues to smoke 7-8 cigarettes a day. Exertion appears to make her shortness of breath worse and nothing makes it better. There are no other associated or modifying factors.    04/05/12- 30 yoF former smoker with COPD complicated by DM, ASCVD, hx colon cancer, anemia., GERD, esophageal varices At ER - see note above: COPD exacerb, UTI. Small LUL nodules seemed larger>CT.   Daughter Monique Fleming  here Unfortunately she has admitted still smoking a few cigarettes daily against all our counseling.  COPD assessment test (CAT) 23/40 Emergency room visit June 27 for COPD exacerbation and UTI. She understood chest x-ray showed "nodules". When she called about it we ordered CT. She still feels tight on a prednisone taper ending today. Was on sulfa for UTI- changed to keflex.  CT chest 04/05/12- reviewed images and report with them IMPRESSION:  1. No acute findings.  2. Emphysema.  3. No pathologic adenopathy within the mediastinum or hilar  regions.  4. Prominent coronary artery calcifications  5. Cirrhosis.  Original Report Authenticated By: Rosealee Albee, M.D.   05/16/12-82 yoF former smoker with COPD complicated by DM, ASCVD, hx colon cancer, anemia., GERD, esophageal varices Denies any SOB, wheezing, cough, or congestion at this time. COPD assessment test (CAT) 15/40 Definitely feels better than last visit   08/04/12- 82 yoF former smoker with COPD complicated by DM, ASCVD, hx colon cancer, anemia., GERD, esophageal varices Breathing about the same as last visit; had few nose bleeds last week. Epistaxis needed packing because of Plavix. COPD assessment test (CAT) score 22/40  10/06/12- 83 yoF former smoker with COPD complicated by DM, ASCVD, hx colon cancer, anemia., GERD, esophageal varices FOLLOWS FOR: slight wheezing,  SOB with activity mostly, cough slight-productive a times-white in color. She had quit smoking around June of 2013. Says she feels "pretty good" with no acute respiratory problems, no recent colds. Little change in chronic light cough with occasional white sputum. Denies chest pain, swollen glands, fever. Rarely needs her nebulizer.  02/03/13- 43 yoF former smoker with COPD complicated by DM, ASCVD, hx colon cancer, anemia., GERD, esophageal varices FOLLOWS FOR: states she is having harder time breathing due to allergy flare up; nasal drainage and sneezing. Itchy, watery eyes as well. We discussed management with antihistamines Otherwise breathing is really not substantially affect. Dyspnea with exertion doing housework. Little cough or phlegm now. She does not hear herself wheeze.  05/01/13-  57 yoF former smoker with COPD complicated by DM, ASCVD, hx colon cancer, anemia., GERD, esophageal varices FOLLOWS FOR: pt reports breathing is about the same since last visit-- pt just received advair so has not had a chance to see if it has helped-- denies any other concerns at this time Insurance wouldn't cover Symbicort, so we switched to Advair. Using E-cigarette. Slight  Occasional cough with little phlegm and no significant events. CXR 02/06/13 IMPRESSION:  Stable changes of chronic obstructive lung disease. No acute  cardiopulmonary process.  Original Report Authenticated By: Carey Bullocks, M.D.  07/07/13- 60 yoF former smoker with COPD complicated by DM, ASCVD, hx colon cancer, anemia., GERD, esophageal varices FOLLOWS FOR: SOB and wheezing pretty bad if  exerting her self or gets upset. Pt states that her copay is $17 for Advair and needs Rx for this for 90 days(so she can see if cheaper through the mail). Had fallen yesterday, scraping her knees, so she rescheduled visit until today. A little more short of breath last day or 2 without a cold or increased wheeze. We reviewed her chest CT results. CT  chest/abd 06/30/13 IMPRESSION:  CT CHEST IMPRESSION  1. No evidence for metastatic disease to the chest.  2. Emphysema.  3. Coronary artery calcifications.  CT ABDOMEN AND PELVIS IMPRESSION  1. No evidence for metastatic disease to the abdomen or pelvis.  2. Morphologic features of the liver compatible with cirrhosis and  portal venous hypertension.   09/07/13- 12 yoF former smoker with COPD complicated by DM, ASCVD, hx colon cancer, anemia., GERD, esophageal varices FOLLOWS FOR:  Increased wheezing over past few months with weather and cough, sob.  Losing lots of weight and has no appitite. Lost 7 lbs since Sept. Some postnasal drip and minimally productive cough. Denies fever, chest pain, blood, nodes. She thinks she had some kind of a reaction to previous pneumonia vaccine- probably intense dermatitis.  Review of Systems-See HPI Constitutional:   No weight loss, night sweats,  Fevers, chills,  +fatigue, lassitude. HEENT:   No headaches,  Difficulty swallowing,  Tooth/dental problems,  Sore throat,                No-sneezing, no-itching, ear ache, no-nasal congestion, +post nasal drip,  CV:  No chest pain, orthopnea, PND, swelling in lower extremities, anasarca, dizziness, palpitations GI  No heartburn, indigestion, abdominal pain, nausea, vomiting,  Resp: + shortness of breath with routine exertion or at rest.  No excess mucus,   No coughing up of blood.  No change in color of mucus.  Skin: no rash or lesions. GU: MS:  No joint pain or swelling.  Marland Kitchen Psych:  No change in mood or affect. + depression or anxiety.  No memory loss.  Objective:   Physical Exam General- Alert, Oriented, Affect-appropriate, Distress- none acute; calm  Skin- rash-none, lesions- none, excoriation- none. Dry.  Lymphadenopathy- none Head- atraumatic            Eyes- Gross vision intact, PERRLA, conjunctivae clear secretions            Ears- Hearing-diminished            Nose- Clear, no-Septal dev, mucus,  polyps, erosion, perforation             Throat- Mallampati II , mucosa clear , drainage- none, tonsils- atrophic, +dentures  Neck- flexible , trachea midline, no stridor , thyroid nl, carotid no bruit Chest - symmetrical excursion , unlabored           Heart/CV- RRR , 1-2/6 systolic precordial murmur AS , no gallop  , no rub, nl s1 s2                           - JVD- none , edema- none, stasis changes- none, varices- none           Lung-  +distant, cough-none, + trace crackles bases , dullness-none, rub- none..           Chest wall-  Abd- Br/ Gen/ Rectal- Not done, not indicated Extrem- cyanosis- none, clubbing, none, atrophy- none, strength- nl Neuro- grossly intact to observation

## 2013-09-12 ENCOUNTER — Other Ambulatory Visit: Payer: Self-pay | Admitting: Family Medicine

## 2013-09-13 ENCOUNTER — Other Ambulatory Visit: Payer: Self-pay | Admitting: *Deleted

## 2013-09-13 MED ORDER — FLUTICASONE-SALMETEROL 100-50 MCG/DOSE IN AEPB: 1.0000 | INHALATION_SPRAY | Freq: Two times a day (BID) | RESPIRATORY_TRACT | Status: DC

## 2013-09-22 ENCOUNTER — Telehealth: Payer: Self-pay | Admitting: Nurse Practitioner

## 2013-09-23 ENCOUNTER — Encounter (HOSPITAL_COMMUNITY): Payer: Self-pay | Admitting: Emergency Medicine

## 2013-09-23 ENCOUNTER — Emergency Department (INDEPENDENT_AMBULATORY_CARE_PROVIDER_SITE_OTHER)
Admission: EM | Admit: 2013-09-23 | Discharge: 2013-09-23 | Disposition: A | Discharge status: Nursing Home (Includes ICF) | Payer: Medicare Other | Source: Home / Self Care | Attending: Emergency Medicine | Admitting: Emergency Medicine

## 2013-09-23 ENCOUNTER — Emergency Department (INDEPENDENT_AMBULATORY_CARE_PROVIDER_SITE_OTHER): Payer: Medicare Other

## 2013-09-23 ENCOUNTER — Emergency Department (HOSPITAL_COMMUNITY)
Admission: EM | Admit: 2013-09-23 | Discharge: 2013-09-23 | Disposition: A | Discharge status: Home / Self Care | Payer: Medicare Other | Attending: Emergency Medicine | Admitting: Emergency Medicine

## 2013-09-23 ENCOUNTER — Emergency Department (HOSPITAL_COMMUNITY): Payer: Medicare Other

## 2013-09-23 DIAGNOSIS — D649 Anemia, unspecified: Secondary | ICD-10-CM | POA: Insufficient documentation

## 2013-09-23 DIAGNOSIS — Z8673 Personal history of transient ischemic attack (TIA), and cerebral infarction without residual deficits: Secondary | ICD-10-CM | POA: Insufficient documentation

## 2013-09-23 DIAGNOSIS — F329 Major depressive disorder, single episode, unspecified: Secondary | ICD-10-CM | POA: Insufficient documentation

## 2013-09-23 DIAGNOSIS — Z872 Personal history of diseases of the skin and subcutaneous tissue: Secondary | ICD-10-CM | POA: Insufficient documentation

## 2013-09-23 DIAGNOSIS — G40909 Epilepsy, unspecified, not intractable, without status epilepticus: Secondary | ICD-10-CM | POA: Insufficient documentation

## 2013-09-23 DIAGNOSIS — F3289 Other specified depressive episodes: Secondary | ICD-10-CM | POA: Insufficient documentation

## 2013-09-23 DIAGNOSIS — K589 Irritable bowel syndrome without diarrhea: Secondary | ICD-10-CM | POA: Insufficient documentation

## 2013-09-23 DIAGNOSIS — Z859 Personal history of malignant neoplasm, unspecified: Secondary | ICD-10-CM | POA: Insufficient documentation

## 2013-09-23 DIAGNOSIS — F411 Generalized anxiety disorder: Secondary | ICD-10-CM | POA: Insufficient documentation

## 2013-09-23 DIAGNOSIS — Z7902 Long term (current) use of antithrombotics/antiplatelets: Secondary | ICD-10-CM | POA: Insufficient documentation

## 2013-09-23 DIAGNOSIS — M898X1 Other specified disorders of bone, shoulder: Secondary | ICD-10-CM

## 2013-09-23 DIAGNOSIS — Z880 Allergy status to penicillin: Secondary | ICD-10-CM | POA: Insufficient documentation

## 2013-09-23 DIAGNOSIS — M546 Pain in thoracic spine: Secondary | ICD-10-CM

## 2013-09-23 DIAGNOSIS — M25519 Pain in unspecified shoulder: Secondary | ICD-10-CM | POA: Insufficient documentation

## 2013-09-23 DIAGNOSIS — J449 Chronic obstructive pulmonary disease, unspecified: Secondary | ICD-10-CM | POA: Insufficient documentation

## 2013-09-23 DIAGNOSIS — Z8619 Personal history of other infectious and parasitic diseases: Secondary | ICD-10-CM | POA: Insufficient documentation

## 2013-09-23 DIAGNOSIS — I209 Angina pectoris, unspecified: Secondary | ICD-10-CM | POA: Insufficient documentation

## 2013-09-23 DIAGNOSIS — Z87891 Personal history of nicotine dependence: Secondary | ICD-10-CM | POA: Insufficient documentation

## 2013-09-23 DIAGNOSIS — Z79899 Other long term (current) drug therapy: Secondary | ICD-10-CM | POA: Insufficient documentation

## 2013-09-23 DIAGNOSIS — Z87828 Personal history of other (healed) physical injury and trauma: Secondary | ICD-10-CM | POA: Insufficient documentation

## 2013-09-23 DIAGNOSIS — M549 Dorsalgia, unspecified: Secondary | ICD-10-CM

## 2013-09-23 DIAGNOSIS — E119 Type 2 diabetes mellitus without complications: Secondary | ICD-10-CM | POA: Insufficient documentation

## 2013-09-23 DIAGNOSIS — J4489 Other specified chronic obstructive pulmonary disease: Secondary | ICD-10-CM | POA: Insufficient documentation

## 2013-09-23 LAB — BASIC METABOLIC PANEL
BUN: 12 mg/dL (ref 6–23)
Chloride: 102 mEq/L (ref 96–112)
Creatinine, Ser: 0.81 mg/dL (ref 0.50–1.10)
GFR calc non Af Amer: 66 mL/min — ABNORMAL LOW (ref >90)
Glucose, Bld: 111 mg/dL — ABNORMAL HIGH (ref 70–99)
Potassium: 3.7 mEq/L (ref 3.5–5.1)
Sodium: 136 mEq/L (ref 135–145)

## 2013-09-23 LAB — CBC WITH DIFFERENTIAL/PLATELET
Basophils Absolute: 0 10*3/uL (ref 0.0–0.1)
Lymphocytes Relative: 13 % (ref 12–46)
Neutro Abs: 8.9 10*3/uL — ABNORMAL HIGH (ref 1.7–7.7)
Platelets: 145 10*3/uL — ABNORMAL LOW (ref 150–400)
RDW: 15.4 % (ref 11.5–15.5)
WBC: 11.7 10*3/uL — ABNORMAL HIGH (ref 4.0–10.5)

## 2013-09-23 LAB — D-DIMER, QUANTITATIVE: D-Dimer, Quant: 2.34 ug/mL-FEU — ABNORMAL HIGH (ref 0.00–0.48)

## 2013-09-23 MED ORDER — HYDROCODONE-ACETAMINOPHEN 5-325 MG PO TABS
1.0000 | ORAL_TABLET | Freq: Once | ORAL | Status: AC
  Administered 2013-09-23: 1 via ORAL

## 2013-09-23 MED ORDER — HYDROCODONE-ACETAMINOPHEN 5-325 MG PO TABS
ORAL_TABLET | ORAL | Status: AC
  Filled 2013-09-23: qty 1

## 2013-09-23 MED ORDER — TRAMADOL HCL 50 MG PO TABS: 50.0000 mg | ORAL_TABLET | Freq: Four times a day (QID) | ORAL | Status: DC | PRN

## 2013-09-23 MED ORDER — IOHEXOL 350 MG/ML SOLN
100.0000 mL | Freq: Once | INTRAVENOUS | Status: AC | PRN
  Administered 2013-09-23: 100 mL via INTRAVENOUS

## 2013-09-23 MED ORDER — ACETAMINOPHEN 325 MG PO TABS
650.0000 mg | ORAL_TABLET | Freq: Once | ORAL | Status: AC
  Administered 2013-09-23: 650 mg via ORAL
  Filled 2013-09-23: qty 2

## 2013-09-23 MED ORDER — SODIUM CHLORIDE 0.9 % IV BOLUS (SEPSIS)
500.0000 mL | Freq: Once | INTRAVENOUS | Status: AC
  Administered 2013-09-23: 500 mL via INTRAVENOUS

## 2013-09-23 MED ORDER — TRAMADOL HCL 50 MG PO TABS
50.0000 mg | ORAL_TABLET | Freq: Once | ORAL | Status: AC
  Administered 2013-09-23: 50 mg via ORAL
  Filled 2013-09-23: qty 1

## 2013-09-23 NOTE — ED Notes (Signed)
79 yr old is here with her daughter with complaints of upper back since yesterday. Daughter states she has a HX of pleurisy . She states she has pain when breathing. She did try a nebulizer tx at home but with no success.

## 2013-09-23 NOTE — ED Provider Notes (Signed)
Chief Complaint:   Chief Complaint  Patient presents with  . Back Pain    History of Present Illness:   Monique Fleming is an 79 year old female with COPD, diabetes, and history of colon cancer who has a history since yesterday morning of left upper back pain localized just over the scapula. It's worse if she moves her arm but also with deep inspiration. She's been little bit more short of breath than usual. She denies any falls, injury, or trauma. She noted she did fall about 3 months ago, but no injuries were found at that time. She has had no fever, chills, URI symptoms, coughing, wheezing, hemoptysis, sputum production, but has had more shortness of breath than usual. She denies any anterior chest pain, palpitations, dizziness, syncope, or GI symptoms.  Review of Systems:  Other than noted above, the patient denies any of the following symptoms. Systemic:  No fever, chills, sweats, or fatigue. ENT:  No nasal congestion, rhinorrhea, or sore throat. Pulmonary:  No cough, wheezing, shortness of breath, sputum production, hemoptysis. Cardiac:  No palpitations, rapid heartbeat, dizziness, presyncope or syncope. GI:  No abdominal pain, heartburn, nausea, or vomiting. Ext:  No leg pain or swelling.  PMFSH:  Past medical history, family history, social history, meds, and allergies were reviewed and updated as needed. She is allergic to Bactrim, ofloxacin, Pneumovax, Augmentin, penicillin, Haldol, morphine, and Sudafed. She has a history of aortic stenosis, TIAs, peripheral vascular disease, irritable bowel syndrome, diabetes, COPD, depression, anxiety, gastroesophageal reflux, angina, seizures, cirrhosis of the liver, thrombocytopenia, and Clostridium difficile infection. Her current meds include vitamin D 3, Questran, clonazepam, Plavix, vitamin B12, ferrous sulfate, Advair, Neurontin, Dilantin, and Klor-Con.  Physical Exam:   Vital signs:  BP 169/76  Pulse 95  Temp(Src) 98.5 F (36.9 C) (Oral)   Resp 25  SpO2 95% Gen:  Alert, oriented, in no distress, skin warm and dry. Eye:  PERRL, lids and conjunctivas normal.  Sclera non-icteric. ENT:  Mucous membranes moist, pharynx clear. Neck:  Supple, no adenopathy or tenderness.  No JVD. Lungs:  Clear to auscultation, no wheezes, rales or rhonchi.  No respiratory distress. Heart:  Regular rhythm.  No gallops, murmers, clicks or rubs. Chest:  She is tender to palpation over the scapula, and the shoulder is nontender but has pain with abduction. Abdomen:  Soft, nontender, no organomegaly or mass.  Bowel sounds normal.  No pulsatile abdominal mass or bruit. Ext:  No edema.  No calf tenderness and Homann's sign negative.  Pulses full and equal. Skin:  Warm and dry.  No rash.  Labs:   Results for orders placed during the hospital encounter of 09/23/13  CBC WITH DIFFERENTIAL      Result Value Range   WBC 11.7 (*) 4.0 - 10.5 K/uL   RBC 4.13  3.87 - 5.11 MIL/uL   Hemoglobin 11.5 (*) 12.0 - 15.0 g/dL   HCT 86.5 (*) 78.4 - 69.6 %   MCV 84.7  78.0 - 100.0 fL   MCH 27.8  26.0 - 34.0 pg   MCHC 32.9  30.0 - 36.0 g/dL   RDW 29.5  28.4 - 13.2 %   Platelets 145 (*) 150 - 400 K/uL   Neutrophils Relative % 76  43 - 77 %   Neutro Abs 8.9 (*) 1.7 - 7.7 K/uL   Lymphocytes Relative 13  12 - 46 %   Lymphs Abs 1.5  0.7 - 4.0 K/uL   Monocytes Relative 9  3 - 12 %  Monocytes Absolute 1.1 (*) 0.1 - 1.0 K/uL   Eosinophils Relative 2  0 - 5 %   Eosinophils Absolute 0.2  0.0 - 0.7 K/uL   Basophils Relative 0  0 - 1 %   Basophils Absolute 0.0  0.0 - 0.1 K/uL  D-DIMER, QUANTITATIVE      Result Value Range   D-Dimer, Quant 2.34 (*) 0.00 - 0.48 ug/mL-FEU     Radiology:  Dg Chest 2 View  09/23/2013   CLINICAL DATA:  Left back pain.  EXAM: CHEST - 2 VIEW  COMPARISON:  CT 06/30/2013 and earlier studies  FINDINGS: Coarse bibasilar interstitial opacities. No overt edema. Heart size upper limits normal. Mildly tortuous thoracic aorta. No effusion. No  pneumothorax. Regional bones unremarkable.  IMPRESSION: Chronic parenchymal changes.  No acute disease.   Electronically Signed   By: Oley Balm M.D.   On: 09/23/2013 16:16   Dg Shoulder Left  09/23/2013   CLINICAL DATA:  The limited range of motion.  Pain.  EXAM: LEFT SHOULDER - 2+ VIEW  COMPARISON:  None.  FINDINGS: There appears to be cortical irregularity within the scapula on the Y-view. If the patient has had trauma to the scapula area and is tender in this area, I cannot exclude a fracture. No glenohumeral abnormality. No proximal humeral fracture.  IMPRESSION: Apparent cortical irregularity within the scapula on the Y-view. Cannot exclude fracture. Recommend clinical correlation for trauma and pain in this area.   Electronically Signed   By: Charlett Nose M.D.   On: 09/23/2013 16:17   I reviewed the images independently and personally and concur with the radiologist's findings.  EKG:   Date: 09/23/2013  Rate: 85  Rhythm: normal sinus rhythm  QRS Axis: normal  Intervals: normal  ST/T Wave abnormalities: normal  Conduction Disutrbances:none  Narrative Interpretation: Sinus rhythm with premature atrial complexes, otherwise normal EKG.  Old EKG Reviewed: none available  Course in Urgent Care Center:   She was given Norco 5/325 one by mouth for pain.  Assessment:  The encounter diagnosis was Upper back pain on left side.  Differential diagnosis is pulmonary embolism which is a possibility with her elevated d-dimer plus increasing shortness of breath. Conversely the pain could be due to a scapular fracture. It's uncertain whether this is new or old. She cannot recall any recent injury, but is entirely possible she may have had an injury and forgotten about it.  Plan:  The patient was transferred to the ED via shuttle in stable condition.  Medical Decision Making:  79 year old female has a 2 day history of left upper back pain which is worse with deep inspiration. She denies any  trauma.  Her EKG was normal, CXR was normal, her shoulder film showed an irregularity of her scapula consistent with a scapular fracture.  She is tender in that area, but cannot recall any recent trauma or injury.  She is more short of breath than usual.  Her D-Dimer was elevated at 2.34.  She is therefore at risk of PE and needs to have a chest CT angiogram.      Reuben Likes, MD 09/23/13 1718

## 2013-09-23 NOTE — ED Notes (Signed)
The pt came from [facility] after she has had rt flank pain since yesterday especially with movement and inspiraion.  Her last fall was 3 months ago.  An xray at [facility] showed a frac scapula on the rt and she had an elevated d-dimer there.  She was sent down here for a c-t angio of the chest.  alert

## 2013-09-23 NOTE — ED Provider Notes (Signed)
CSN: [id number]     Arrival date & time 09/23/13  1732 History   First MD Initiated Contact with Patient 09/23/13 1741     Chief Complaint  Patient presents with  . Flank Pain   HPI  79 y/o female who presents with left upper back pain. Symptoms began yesterday. Was seen by urgent care. Had XR which questioned left scapula fracture. Also, D-dimer was elevated. She denies any trauma or at least what she can remember. Last fall was 3 months ago and no injuries were found at that time. She told the provider at urgent care she has had increased SOB but denies this to me. The patient also denies any chest pain other than her left shoulder blade. She denies any dyspnea on exertion. She was sent here for further evaluation and CT angio. She denies any fevers, chills, coughing, wheezing or hemoptysis. She is currently complaining of left scapular pain that worsens with palpation and movement of her arm.   Past Medical History  Diagnosis Date  . Aortic stenosis     moderate by echo 6/11 gradient 20/71mmHg for mean and peak  . TIA (transient ischemic attack)     Dr. Pearlean Brownie  . Peripheral vascular disease   . IBS (irritable bowel syndrome)   . Diabetes mellitus     type II  . Chronic bronchitis   . COPD (chronic obstructive pulmonary disease)   . Atherosclerosis   . Depression   . Anxiety     Dr Donell Beers  . Renal cyst   . Anemia     iron deficiency  . Cellulitis   . Diverticular disease   . Infectious diarrhea(009.2)   . Sinusitis   . Thrush   . Abnormal liver function test   . GERD (gastroesophageal reflux disease)   . Insomnia     chronic  . Adenocarcinoma 2008    colon,s/p right hemicolectomy  . Angina   . Seizures     hx of one seizure "  . Cirrhosis of liver     hx of  . Clostridium difficile diarrhea   . Thrombocytopenia 04/01/2013   Past Surgical History  Procedure Laterality Date  . Carotid endarterectomy    . Colectomy      right hemi  . Cholecystectomy  11/09    Rt  Breast  . Upper gastrointestinal endoscopy     Family History  Problem Relation Age of Onset  . Colon cancer Paternal Grandmother   . Esophageal cancer Brother   . Heart disease Mother   . Diabetes Brother   . Hypertension Father   . Rectal cancer Neg Hx   . Stomach cancer Neg Hx    History  Substance Use Topics  . Smoking status: Former Smoker -- 0.50 packs/day    Types: Cigarettes    Quit date: 03/29/2012  . Smokeless tobacco: Current User     Comment: Using Electronic cigarette  . Alcohol Use: No   OB History   Grav Para Term Preterm Abortions TAB SAB Ect Mult Living                 Review of Systems  Constitutional: Negative for fever and chills.  Respiratory: Negative for cough and shortness of breath.   Cardiovascular: Negative for chest pain and leg swelling.  Gastrointestinal: Negative for nausea, vomiting and abdominal pain.  Genitourinary: Positive for flank pain. Negative for dysuria and frequency.  All other systems reviewed and are negative.    Allergies  Bactrim; Levofloxacin; Pneumovax; Amoxicillin-pot clavulanate; Penicillins; Haldol; Morphine and related; and Sudafed  Home Medications   Current Outpatient Rx  Name  Route  Sig  Dispense  Refill  . Cholecalciferol (VITAMIN D3) 1000 UNITS tablet   Oral   Take 1,000 Units by mouth daily.           . cholestyramine (QUESTRAN) 4 G packet   Oral   Take 4 g by mouth 2 (two) times daily as needed (diarrhea).         . clonazePAM (KLONOPIN) 1 MG tablet   Oral   Take 1 mg by mouth at bedtime.         . clopidogrel (PLAVIX) 75 MG tablet   Oral   Take 1 tablet (75 mg total) by mouth daily.   90 tablet   1   . Cyanocobalamin (VITAMIN B-12) 500 MCG SUBL   Sublingual   Place 1 tablet (500 mcg total) under the tongue 1 day or 1 dose.   90 tablet   3   . ferrous sulfate 325 (65 FE) MG tablet   Oral   Take 1 tablet (325 mg total) by mouth daily with breakfast.   90 tablet   1   .  Fluticasone-Salmeterol (ADVAIR DISKUS) 100-50 MCG/DOSE AEPB   Inhalation   Inhale 1 puff into the lungs 2 (two) times daily.   180 each   3   . ibuprofen (ADVIL,MOTRIN) 200 MG tablet   Oral   Take 400 mg by mouth 3 (three) times daily as needed (pain).         . Multiple Vitamin (MULTIVITAMIN WITH MINERALS) TABS tablet   Oral   Take 1 tablet by mouth daily.         . phenytoin (DILANTIN) 100 MG ER capsule      TAKE 2 CAPSULES (200 MG TOTAL) AT BEDTIME   90 capsule   1   . potassium chloride (KLOR-CON 10) 10 MEQ tablet   Oral   Take 1 tablet (10 mEq total) by mouth 2 (two) times daily.   90 tablet   1   . SALINE NA   Nasal   Place 2 drops into the nose See admin instructions. Use every night at bedtime and one additional time during the day if needed for congestion         . glucose blood test strip      Use as instructed   100 each   3   . traMADol (ULTRAM) 50 MG tablet   Oral   Take 1 tablet (50 mg total) by mouth every 6 (six) hours as needed.   15 tablet   0    BP 131/52  Pulse 80  Temp(Src) 97.8 F (36.6 C) (Oral)  Resp 20  SpO2 93% Physical Exam  Nursing note and vitals reviewed. Constitutional: She is oriented to person, place, and time. She appears well-developed and well-nourished. No distress.  HENT:  Head: Normocephalic and atraumatic.  Eyes: Conjunctivae are normal. Pupils are equal, round, and reactive to light.  Neck: Normal range of motion. Neck supple.  Cardiovascular: Normal rate and regular rhythm.  Exam reveals no gallop and no friction rub.   No murmur heard. Pulmonary/Chest: Effort normal and breath sounds normal.    TTP over left scapula  Abdominal: Soft. She exhibits no distension. There is no tenderness.  Musculoskeletal: Normal range of motion. She exhibits no edema and no tenderness.  Neurological: She is alert and oriented  to person, place, and time. She has normal strength and normal reflexes. No cranial nerve deficit or  sensory deficit.  Skin: Skin is warm and dry.  Psychiatric: She has a normal mood and affect.    ED Course  Procedures (including critical care time) Labs Review Labs Reviewed  BASIC METABOLIC PANEL - Abnormal; Notable for the following:    Glucose, Bld 111 (*)    GFR calc non Af Amer 66 (*)    GFR calc Af Amer 76 (*)    All other components within normal limits   Imaging Review Dg Chest 2 View  09/23/2013   CLINICAL DATA:  Left back pain.  EXAM: CHEST - 2 VIEW  COMPARISON:  CT 06/30/2013 and earlier studies  FINDINGS: Coarse bibasilar interstitial opacities. No overt edema. Heart size upper limits normal. Mildly tortuous thoracic aorta. No effusion. No pneumothorax. Regional bones unremarkable.  IMPRESSION: Chronic parenchymal changes.  No acute disease.   Electronically Signed   By: Oley Balm M.D.   On: 09/23/2013 16:16   Ct Angio Chest W/cm &/or Wo Cm  09/23/2013   CLINICAL DATA:  Flank pain.  EXAM: CT ANGIOGRAPHY CHEST WITH CONTRAST  TECHNIQUE: Multidetector CT imaging of the chest was performed using the standard protocol during bolus administration of intravenous contrast. Multiplanar CT image reconstructions including MIPs were obtained to evaluate the vascular anatomy.  CONTRAST:  OMNIPAQUE IOHEXOL 350 MG/ML SOLN  COMPARISON:  06/30/2013  FINDINGS: THORACIC INLET/BODY WALL:  No acute abnormality.  MEDIASTINUM:  Normal heart size. Diffuse atherosclerosis, with extensive irregular plaque and thrombus along the aorta. Coronary artery atherosclerosis which is diffuse. Mitral annular calcification, incidental. There is aortic valvular calcification which is mild to moderate for age. No pericardial effusion. No acute vascular abnormality. No adenopathy.  LUNG WINDOWS:  Moderate centrilobular emphysema. Diffuse bronchial wall thickening with endobronchial debris/mucoid impaction that is multi focal - also likely smoking-related. No pneumothorax to explain chest pain. No suspicious  pulmonary nodule this patient with history of colon cancer.  UPPER ABDOMEN:  Cirrhotic liver, which is only partly imaged. There is a locule of gas which appear central in the right liver, indeterminate due to limited imaging. Splenomegaly, chronic.  OSSEOUS:  No acute fracture.  No suspicious lytic or blastic lesions.  Review of the MIP images confirms the above findings.  IMPRESSION: 1. No evidence of pulmonary embolism or other acute intrathoracic disease. 2. Moderate emphysema. 3. Cirrhotic liver. 4. Locule of gas in the right abdomen, not seen previously. This could be pneumobilia from previous sphincterotomy. Correlate with right upper quadrant symptoms.   Electronically Signed   By: Tiburcio Pea M.D.   On: 09/23/2013 21:54   Dg Shoulder Left  09/23/2013   CLINICAL DATA:  The limited range of motion.  Pain.  EXAM: LEFT SHOULDER - 2+ VIEW  COMPARISON:  None.  FINDINGS: There appears to be cortical irregularity within the scapula on the Y-view. If the patient has had trauma to the scapula area and is tender in this area, I cannot exclude a fracture. No glenohumeral abnormality. No proximal humeral fracture.  IMPRESSION: Apparent cortical irregularity within the scapula on the Y-view. Cannot exclude fracture. Recommend clinical correlation for trauma and pain in this area.   Electronically Signed   By: Charlett Nose M.D.   On: 09/23/2013 16:17    EKG Interpretation   None       MDM   Seen at urgent care with left upper back pain. EKG and  CXR at urgent care both normal. XR revealed ? Left scapula fracture. Examination is benign except for left scapular tenderness. The patient is tender in this location but denies injury making fracture unusual. Elevated D-dimer at Urgent care. Sent here for CTA which was obtained and revealed no evidence of PE or additional acute findings. CT did not fully visualize complete scapula but visualized portions without fracture. Will treat pain as musculoskeletal in  etiology. Script for ultram provided. Follow up with pcp if symptoms are not improved in 1 week. Return precautions given and discussed with the patient who was in agreement with the plan.   1. Pain in scapula       Shanon Ace, MD 09/24/13 [phone]

## 2013-09-23 NOTE — ED Provider Notes (Signed)
Sent here from [facility]. Complains of right sided parascapular pain onset yesterday. No trauma. No shortness of breath. Pain has a pleuritic component. Pain is nonradiating mild to moderate at present. On exam no distress lungs clear auscultation heart regular rate and rhythm she has mild tenderness at right medial border of the scapula  Doug Sou, MD 09/23/13 [id number]

## 2013-09-24 NOTE — ED Provider Notes (Signed)
I have personally seen and examined the patient.  I have discussed the plan of care with the resident.  I have reviewed the documentation on PMH/FH/Soc. History.  I have reviewed the documentation of the resident and agree.  Doug Sou, MD 09/24/13 [phone]

## 2013-09-26 NOTE — Assessment & Plan Note (Signed)
Mild nonspecific cough may reflect some seasonal exacerbation but nothing specific. We will not give Prevnar because of her history of intense local reaction to Pneumovax.

## 2013-10-03 ENCOUNTER — Ambulatory Visit (INDEPENDENT_AMBULATORY_CARE_PROVIDER_SITE_OTHER): Payer: Medicare Other | Admitting: Family Medicine

## 2013-10-03 ENCOUNTER — Encounter: Payer: Self-pay | Admitting: Family Medicine

## 2013-10-03 VITALS — BP 128/78 | HR 105 | Temp 98.2°F | Ht 65.0 in | Wt 128.0 lb

## 2013-10-03 DIAGNOSIS — E119 Type 2 diabetes mellitus without complications: Secondary | ICD-10-CM

## 2013-10-03 DIAGNOSIS — T148XXA Other injury of unspecified body region, initial encounter: Secondary | ICD-10-CM

## 2013-10-03 DIAGNOSIS — D649 Anemia, unspecified: Secondary | ICD-10-CM

## 2013-10-03 DIAGNOSIS — J449 Chronic obstructive pulmonary disease, unspecified: Secondary | ICD-10-CM

## 2013-10-03 DIAGNOSIS — D509 Iron deficiency anemia, unspecified: Secondary | ICD-10-CM

## 2013-10-03 DIAGNOSIS — E785 Hyperlipidemia, unspecified: Secondary | ICD-10-CM

## 2013-10-03 DIAGNOSIS — T07XXXA Unspecified multiple injuries, initial encounter: Secondary | ICD-10-CM

## 2013-10-03 DIAGNOSIS — Z8679 Personal history of other diseases of the circulatory system: Secondary | ICD-10-CM

## 2013-10-03 LAB — CBC
HCT: 34.1 % — ABNORMAL LOW (ref 36.0–46.0)
Hemoglobin: 11.5 g/dL — ABNORMAL LOW (ref 12.0–15.0)
MCH: 27.4 pg (ref 26.0–34.0)
MCHC: 33.7 g/dL (ref 30.0–36.0)
Platelets: 203 10*3/uL (ref 150–400)
RBC: 4.19 MIL/uL (ref 3.87–5.11)
WBC: 10.5 10*3/uL (ref 4.0–10.5)

## 2013-10-03 LAB — TSH: TSH: 1.714 u[IU]/mL (ref 0.350–4.500)

## 2013-10-03 LAB — HEPATIC FUNCTION PANEL
Bilirubin, Direct: 0.1 mg/dL (ref 0.0–0.3)
Indirect Bilirubin: 0.2 mg/dL (ref 0.0–0.9)

## 2013-10-03 LAB — RENAL FUNCTION PANEL
Calcium: 8.7 mg/dL (ref 8.4–10.5)
Chloride: 102 mEq/L (ref 96–112)
Glucose, Bld: 212 mg/dL — ABNORMAL HIGH (ref 70–99)
Phosphorus: 2.5 mg/dL (ref 2.3–4.6)
Potassium: 4 mEq/L (ref 3.5–5.3)
Sodium: 138 mEq/L (ref 135–145)

## 2013-10-03 MED ORDER — TRAMADOL HCL 50 MG PO TABS: 50.0000 mg | ORAL_TABLET | Freq: Four times a day (QID) | ORAL | Status: DC | PRN

## 2013-10-03 NOTE — Progress Notes (Signed)
Pre visit review using our clinic review tool, if applicable. No additional management support is needed unless otherwise documented below in the visit note. 

## 2013-10-03 NOTE — Progress Notes (Signed)
Patient ID: Monique Fleming, female   DOB: 1928-12-04, 79 y.o.   MRN: [id number] Monique Fleming [id number] [birth year] 10/03/2013      Progress Note-Follow Up  Subjective  Chief Complaint  Chief Complaint  Patient presents with  . Follow-up    from fall 3 months ago    HPI  Patient is an 79 year old Caucasian female who is in today for followup. She had a fall a couple of months ago requiring numerous stitches. She fell on concrete and suffered significant lesions and contusions as well as a fracture of her left scapula. Everything is healing well but she is left with some chronic shoulder pain. She's had no further falls since then. Unfortunately she continues to smoke and struggles [place] with shortness of breath. His following with pulmonology. No recent fevers or chills. No GI or GU complaints otherwise. Did have carotid Dopplers which showed 60% stenosis and bilateral carotids and they're going to be her ultrasound in 6 months. No fevers or chills.  Past Medical History  Diagnosis Date  . Aortic stenosis     moderate by echo 6/11 gradient 20/18mmHg for mean and peak  . TIA (transient ischemic attack)     Dr. Pearlean Brownie  . Peripheral vascular disease   . IBS (irritable bowel syndrome)   . Diabetes mellitus     type II  . Chronic bronchitis   . COPD (chronic obstructive pulmonary disease)   . Atherosclerosis   . Depression   . Anxiety     Dr Donell Beers  . Renal cyst   . Anemia     iron deficiency  . Cellulitis   . Diverticular disease   . Infectious diarrhea(009.2)   . Sinusitis   . Thrush   . Abnormal liver function test   . GERD (gastroesophageal reflux disease)   . Insomnia     chronic  . Adenocarcinoma 2008    colon,s/p right hemicolectomy  . Angina   . Seizures     hx of one seizure "  . Cirrhosis of liver     hx of  . Clostridium difficile diarrhea   . Thrombocytopenia 04/01/2013    Past Surgical History  Procedure Laterality Date  . Carotid endarterectomy    .  Colectomy      right hemi  . Cholecystectomy  11/09    Rt Breast  . Upper gastrointestinal endoscopy      Family History  Problem Relation Age of Onset  . Colon cancer Paternal Grandmother   . Esophageal cancer Brother   . Heart disease Mother   . Diabetes Brother   . Hypertension Father   . Rectal cancer Neg Hx   . Stomach cancer Neg Hx     History   Social History  . Marital Status: Widowed    Spouse Name: N/A    Number of Children: N/A  . Years of Education: N/A   Occupational History  . retired    Social History Main Topics  . Smoking status: Former Smoker -- 0.50 packs/day    Types: Cigarettes    Quit date: 03/29/2012  . Smokeless tobacco: Current User     Comment: Using Electronic cigarette  . Alcohol Use: No  . Drug Use: No  . Sexual Activity: No   Other Topics Concern  . Not on file   Social History Narrative   Patient signed a Designated Party Release to allow her daughter, Zahraa Bhargava, to have access to her medical records/information. Entered by Glee Arvin  [place] March 24,2011 @ 2:47 pm   Dailly Caffeine Use    Current Outpatient Prescriptions on File Prior to Visit  Medication Sig Dispense Refill  . Cholecalciferol (VITAMIN D3) 1000 UNITS tablet Take 1,000 Units by mouth daily.        . cholestyramine (QUESTRAN) 4 G packet Take 4 g by mouth 2 (two) times daily as needed (diarrhea).      . clonazePAM (KLONOPIN) 1 MG tablet Take 1 mg by mouth at bedtime.      . clopidogrel (PLAVIX) 75 MG tablet Take 1 tablet (75 mg total) by mouth daily.  90 tablet  1  . Cyanocobalamin (VITAMIN B-12) 500 MCG SUBL Place 1 tablet (500 mcg total) under the tongue 1 day or 1 dose.  90 tablet  3  . ferrous sulfate 325 (65 FE) MG tablet Take 1 tablet (325 mg total) by mouth daily with breakfast.  90 tablet  1  . Fluticasone-Salmeterol (ADVAIR DISKUS) 100-50 MCG/DOSE AEPB Inhale 1 puff into the lungs 2 (two) times daily.  180 each  3  . glucose blood test strip Use as instructed   100 each  3  . ibuprofen (ADVIL,MOTRIN) 200 MG tablet Take 400 mg by mouth 3 (three) times daily as needed (pain).      . Multiple Vitamin (MULTIVITAMIN WITH MINERALS) TABS tablet Take 1 tablet by mouth daily.      . phenytoin (DILANTIN) 100 MG ER capsule TAKE 2 CAPSULES (200 MG TOTAL) AT BEDTIME  90 capsule  1  . potassium chloride (KLOR-CON 10) 10 MEQ tablet Take 1 tablet (10 mEq total) by mouth 2 (two) times daily.  90 tablet  1  . SALINE NA Place 2 drops into the nose See admin instructions. Use every night at bedtime and one additional time during the day if needed for congestion      . traMADol (ULTRAM) 50 MG tablet Take 1 tablet (50 mg total) by mouth every 6 (six) hours as needed.  15 tablet  0   No current facility-administered medications on file prior to visit.    Allergies  Allergen Reactions  . Bactrim [Sulfamethoxazole-Trimethoprim] Shortness Of Breath  . Levofloxacin Other (See Comments)    seizure  . Pneumovax [Pneumococcal Polysaccharides] Other (See Comments)    Severe allergy-cellulitis  . Amoxicillin-Pot Clavulanate Other (See Comments)    Patient stated Drs. recommend taking Augmentin due to her PCN allergy.  . Penicillins Rash  . Haldol [Haloperidol Lactate]     Had unknown reaction many yrs ago per pt  . Morphine And Related     Confused and agitation  . Sudafed [Pseudoephedrine Hcl] Other (See Comments)    seizure    Review of Systems  Review of Systems  Constitutional: Negative for fever and malaise/fatigue.  HENT: Negative for congestion.   Eyes: Negative for discharge.  Respiratory: Negative for shortness of breath.   Cardiovascular: Negative for chest pain, palpitations and leg swelling.  Gastrointestinal: Negative for nausea, abdominal pain and diarrhea.  Genitourinary: Negative for dysuria.  Musculoskeletal: Negative for falls.  Skin: Negative for rash.  Neurological: Negative for loss of consciousness and headaches.  Endo/Heme/Allergies:  Negative for polydipsia.  Psychiatric/Behavioral: Negative for depression and suicidal ideas. The patient is not nervous/anxious and does not have insomnia.     Objective  BP 128/78  Pulse 105  Temp(Src) 98.2 F (36.8 C) (Oral)  Ht 5\' 5"  (1.651 m)  Wt 128 lb (58.06 kg)  BMI 21.30 kg/m2  SpO2  91%  Physical Exam  Physical Exam  Constitutional: She is oriented to person, place, and time and well-developed, well-nourished, and in no distress. No distress.  HENT:  Head: Normocephalic and atraumatic.  Eyes: Conjunctivae are normal.  Neck: Neck supple. No thyromegaly present.  Cardiovascular: Normal rate, regular rhythm and normal heart sounds.   No murmur heard. Pulmonary/Chest: Effort normal and breath sounds normal. She has no wheezes.  Abdominal: She exhibits no distension and no mass.  Musculoskeletal: She exhibits no edema.  Lymphadenopathy:    She has no cervical adenopathy.  Neurological: She is alert and oriented to person, place, and time.  Skin: Skin is warm and dry. No rash noted. She is not diaphoretic.  Psychiatric: Memory, affect and judgment normal.    Lab Results  Component Value Date   TSH 2.076 03/06/2013   Lab Results  Component Value Date   WBC 11.7* 09/23/2013   HGB 11.5* 09/23/2013   HCT 35.0* 09/23/2013   MCV 84.7 09/23/2013   PLT 145* 09/23/2013   Lab Results  Component Value Date   CREATININE 0.81 09/23/2013   BUN 12 09/23/2013   NA 136 09/23/2013   K 3.7 09/23/2013   CL 102 09/23/2013   CO2 27 09/23/2013   Lab Results  Component Value Date   ALT 13 06/21/2013   AST 39* 06/21/2013   ALKPHOS 107 06/21/2013   BILITOT 0.3 06/21/2013   Lab Results  Component Value Date   CHOL 130 08/10/2012   Lab Results  Component Value Date   HDL 22* 08/10/2012   Lab Results  Component Value Date   LDLCALC 76 08/10/2012   Lab Results  Component Value Date   TRIG 159* 08/10/2012   Lab Results  Component Value Date   CHOLHDL 5.9 08/10/2012     Assessment & Plan  Wounds, multiple S/p fall on concrete several months ago, well healed. Left scapular fracture took the longest to heal. Pain tolerable.  TRANSIENT ISCHEMIC ATTACK, HX OF Carotid dopplers showed 60% blockage of b/l carotids, no recent episodes, continue Plavix  ANEMIA, IRON DEFICIENCY Mild, continue ferrous sulfate  DIABETES MELLITUS, TYPE II hgba1c 6.9, minimize simple carbs and increase activity as tolerated, no change in meds  COPD with chronic bronchitis Following with pulmonology, stable

## 2013-10-03 NOTE — Patient Instructions (Signed)

## 2013-10-04 LAB — HEMOGLOBIN A1C
Hgb A1c MFr Bld: 6.9 % — ABNORMAL HIGH (ref <5.7)
Mean Plasma Glucose: 151 mg/dL — ABNORMAL HIGH (ref <117)

## 2013-10-09 NOTE — Assessment & Plan Note (Signed)
hgba1c 6.9, minimize simple carbs and increase activity as tolerated, no change in meds

## 2013-10-09 NOTE — Assessment & Plan Note (Signed)
S/p fall on concrete several months ago, well healed. Left scapular fracture took the longest to heal. Pain tolerable.

## 2013-10-09 NOTE — Assessment & Plan Note (Signed)
Following with pulmonology, stable

## 2013-10-09 NOTE — Assessment & Plan Note (Addendum)
Carotid dopplers showed 60% blockage of b/l carotids, no recent episodes, continue Plavix

## 2013-10-09 NOTE — Assessment & Plan Note (Signed)
Mild, continue ferrous sulfate

## 2013-10-10 ENCOUNTER — Telehealth: Payer: Self-pay

## 2013-10-10 NOTE — Telephone Encounter (Signed)
Patient would like a copy of labs mailed to her.  Labs mailed

## 2013-10-18 ENCOUNTER — Encounter: Payer: Self-pay | Admitting: Neurology

## 2013-10-23 ENCOUNTER — Encounter: Payer: Self-pay | Admitting: *Deleted

## 2013-10-25 ENCOUNTER — Telehealth: Payer: Self-pay | Admitting: Internal Medicine

## 2013-10-25 MED ORDER — AZITHROMYCIN 250 MG PO TABS: ORAL_TABLET | ORAL | Status: DC

## 2013-10-25 MED ORDER — OSELTAMIVIR PHOSPHATE 75 MG PO CAPS: 75.0000 mg | ORAL_CAPSULE | Freq: Two times a day (BID) | ORAL | Status: DC

## 2013-10-25 NOTE — Telephone Encounter (Signed)
Spoke with pt and is aware of recs. RX has been sent. Nothing further needed

## 2013-10-25 NOTE — Telephone Encounter (Signed)
Last OV 09-07-13. Spoke with the pt and she is c/o having chest tightness, increased SOB, wheezing, and sinus congestion, body aches, chills x 2 days. Pt unsure if she has a fever. Pt is requesting abx and nasal spray. Please advise. Posey Bing, CMA Allergies  Allergen Reactions  . Bactrim [Sulfamethoxazole-Trimethoprim] Shortness Of Breath  . Levofloxacin Other (See Comments)    seizure  . Pneumovax [Pneumococcal Polysaccharides] Other (See Comments)    Severe allergy-cellulitis  . Amoxicillin-Pot Clavulanate Other (See Comments)    Patient stated Drs. recommend taking Augmentin due to her PCN allergy.  . Penicillins Rash  . Haldol [Haloperidol Lactate]     Had unknown reaction many yrs ago per pt  . Morphine And Related     Confused and agitation  . Sudafed [Pseudoephedrine Hcl] Other (See Comments)    seizure

## 2013-10-25 NOTE — Telephone Encounter (Signed)
I'm really sorry she doesn't feel well and ask she let us know if she doesn't get better soon. Suggest  Tamiflu 75 mg, # 10, 1 twice daily Z pak, # 6, 2 today then one daily Ok to use otc nasal decongestant spray, like Afrin, 1 puff each nostril, not more than twice daily and not longer than 3 days.  She can also use a saline nasal spray like AYR as often as she needs.

## 2013-10-26 ENCOUNTER — Telehealth: Payer: Self-pay

## 2013-10-26 NOTE — Telephone Encounter (Signed)
So as far as I can ssee she has not been on metformin since I have been seeing her and her sugars have been well controlled, I am not opposed to her being on it we can discuss it after her next set of blood work at her next visit.

## 2013-10-26 NOTE — Telephone Encounter (Signed)
Patients daughter left a message stating that she would like to know why her mother is no longer taking Metformin?

## 2013-10-27 NOTE — Telephone Encounter (Signed)
Left a message for pts daughter to return my call.  

## 2013-11-01 ENCOUNTER — Telehealth: Payer: Self-pay | Admitting: Family Medicine

## 2013-11-01 NOTE — Telephone Encounter (Signed)
Relevant patient education assigned to patient using Emmi. ° °

## 2013-11-08 ENCOUNTER — Telehealth: Payer: Self-pay | Admitting: Internal Medicine

## 2013-11-08 NOTE — Telephone Encounter (Signed)
Pt states she is having a terrible time with her hemorrhoids. States she is having a lot of pain. Pt requests to be seen. Pt scheduled to see Dr. Henrene Pastor tomorrow at 1:30pm. Pt aware of appt.

## 2013-11-09 ENCOUNTER — Encounter: Payer: Self-pay | Admitting: Internal Medicine

## 2013-11-09 ENCOUNTER — Other Ambulatory Visit (INDEPENDENT_AMBULATORY_CARE_PROVIDER_SITE_OTHER): Payer: Medicare Other

## 2013-11-09 ENCOUNTER — Ambulatory Visit: Payer: Medicare Other | Admitting: Internal Medicine

## 2013-11-09 ENCOUNTER — Ambulatory Visit (INDEPENDENT_AMBULATORY_CARE_PROVIDER_SITE_OTHER): Payer: Medicare Other | Admitting: Internal Medicine

## 2013-11-09 VITALS — BP 130/50 | HR 80

## 2013-11-09 DIAGNOSIS — K648 Other hemorrhoids: Secondary | ICD-10-CM

## 2013-11-09 DIAGNOSIS — D649 Anemia, unspecified: Secondary | ICD-10-CM

## 2013-11-09 DIAGNOSIS — K7460 Unspecified cirrhosis of liver: Secondary | ICD-10-CM

## 2013-11-09 LAB — CBC WITH DIFFERENTIAL/PLATELET
Basophils Absolute: 0 10*3/uL (ref 0.0–0.1)
Basophils Relative: 0.2 % (ref 0.0–3.0)
EOS PCT: 4.5 % (ref 0.0–5.0)
Eosinophils Absolute: 0.3 10*3/uL (ref 0.0–0.7)
HEMATOCRIT: 33 % — AB (ref 36.0–46.0)
Hemoglobin: 10.5 g/dL — ABNORMAL LOW (ref 12.0–15.0)
LYMPHS PCT: 19.4 % (ref 12.0–46.0)
Lymphs Abs: 1.3 10*3/uL (ref 0.7–4.0)
MCHC: 31.7 g/dL (ref 30.0–36.0)
MCV: 79.5 fl (ref 78.0–100.0)
Monocytes Absolute: 0.6 10*3/uL (ref 0.1–1.0)
Monocytes Relative: 8.1 % (ref 3.0–12.0)
NEUTROS PCT: 67.8 % (ref 43.0–77.0)
Neutro Abs: 4.6 10*3/uL (ref 1.4–7.7)
Platelets: 161 10*3/uL (ref 150.0–400.0)
RBC: 4.15 Mil/uL (ref 3.87–5.11)
RDW: 16.4 % — AB (ref 11.5–14.6)
WBC: 6.9 10*3/uL (ref 4.5–10.5)

## 2013-11-09 MED ORDER — HYDROCORTISONE ACETATE 25 MG RE SUPP: 25.0000 mg | Freq: Every day | RECTAL | Status: DC

## 2013-11-09 NOTE — Progress Notes (Signed)
HISTORY OF PRESENT ILLNESS:  Monique Fleming is a 80 y.o. female with MULTIPLE SIGNIFICANT medical problems including, but not limited to diabetes mellitus, cerebrovascular disease with prior stroke, aortic stenosis, COPD, hepatic cirrhosis, colon cancer status post right hemicolectomy, chronic noninfectious diarrhea, anxiety, depression, colonic AVMs, chronic anemia, prior cholecystectomy, prior endarterectomy. On chronic Plavix therapy. Last seen in the office 08/31/2012 regarding iron deficiency anemia felt secondary to known AVMs. She subsequently underwent colonoscopy 10/13/2012. She was found to have angiodysplasia in the right colon and evidence of prior right hemicolectomy. Otherwise normal. Advised to continue on iron chronically. She states that she has. Tells me that she's had difficulties with falls. Underwent a CT scan in September 2014 to evaluate weight loss and fatigue. Multiple chronic abnormalities, but no acute abnormalities. Blood work from December reveals hemoglobin 11.5. Normal basic metabolic panel. Last endoscopy February 2013 revealed portal gastropathy. No varices. Chief complaint today is rectal burning with bowel movements. Minor rectal bleeding as well. Still with loose stools. Nausea and decreased appetite without vomiting. Occasional cramping lower abdominal discomfort. The only new problem is the rectal burning. Multiple non-GI complaints. She wants her blood count checked. See below.  REVIEW OF SYSTEMS:  All non-GI ROS negative except for sinus and allergy trouble, anxiety, arthritis, cough, dyspnea on exertion, depression, fatigue, headaches, hearing problems, irregular heartbeat, itching, insomnia, increased thirst, excessive urination, urinary leakage, swollen lymph glands  Past Medical History  Diagnosis Date  . Aortic stenosis     moderate by echo 6/11 gradient 20/57mmHg for mean and peak  . TIA (transient ischemic attack)     Dr. [name]  . Peripheral vascular  disease   . IBS (irritable bowel syndrome)   . Diabetes mellitus     type II  . Chronic bronchitis   . COPD (chronic obstructive pulmonary disease)   . Atherosclerosis   . Depression   . Anxiety     Dr Casimiro Needle  . Renal cyst   . Anemia     iron deficiency  . Cellulitis   . Diverticular disease   . Infectious diarrhea(009.2)   . Sinusitis   . Thrush   . Abnormal liver function test   . GERD (gastroesophageal reflux disease)   . Insomnia     chronic  . Adenocarcinoma 2008    colon,s/p right hemicolectomy  . Angina   . Seizures     hx of one seizure "  . Cirrhosis of liver     hx of  . Clostridium difficile diarrhea   . Thrombocytopenia 04/01/2013    Past Surgical History  Procedure Laterality Date  . Carotid endarterectomy    . Colectomy      right hemi  . Cholecystectomy  11/09    Rt Breast  . Upper gastrointestinal endoscopy      Social History Monique Fleming  reports that she quit smoking about 19 months ago. Her smoking use included Cigarettes. She smoked 0.50 packs per day. She uses smokeless tobacco. She reports that she does not drink alcohol or use illicit drugs.  family history includes Colon cancer in her paternal grandmother; Diabetes in her brother; Esophageal cancer in her brother; Heart disease in her mother; Hypertension in her father. There is no history of Rectal cancer or Stomach cancer.  Allergies  Allergen Reactions  . Bactrim [Sulfamethoxazole-Trimethoprim] Shortness Of Breath  . Levofloxacin Other (See Comments)    seizure  . Pneumovax [Pneumococcal Polysaccharides] Other (See Comments)    Severe allergy-cellulitis  .  Amoxicillin-Pot Clavulanate Other (See Comments)    Patient stated Drs. recommend taking Augmentin due to her PCN allergy.  . Penicillins Rash  . Haldol [Haloperidol Lactate]     Had unknown reaction many yrs ago per pt  . Morphine And Related     Confused and agitation  . Sudafed [Pseudoephedrine Hcl] Other (See Comments)     seizure       PHYSICAL EXAMINATION: Vital signs: There were no vitals taken for this visit. please see vital signs section above Constitutional: Chronically ill-appearing, no acute distress Psychiatric: alert and oriented x3, cooperative Eyes: extraocular movements intact, anicteric, conjunctiva pink Mouth: oral pharynx moist, no lesions Neck: supple no lymphadenopathy Cardiovascular: heart regular rate and rhythm, systolic murmur Lungs: clear to auscultation bilaterally Abdomen: soft, nontender, nondistended, no obvious ascites, no peritoneal signs, normal bowel sounds, no organomegaly Rectal: Tenderness posteriorly with probable fissure. Brown Hemoccult negative stool Extremities: no lower extremity edema bilaterally Skin: no lesions on visible extremities Neuro: No focal deficits. No asterixis.   ASSESSMENT:  #1. Rectal burning secondary to fissure #2. Hepatic cirrhosis. Compensated #3. History of colon cancer #4. Iron deficiency anemia secondary to known AVMs and portal gastropathy #5. Multiple medical problems   PLAN:  #1. Anusol-HC suppository #2. Continue iron supplementation #3. Check CBC as requested #4. Return to her PCP regarding multiple non-GI complaints

## 2013-11-09 NOTE — Patient Instructions (Signed)
Your physician has requested that you go to the basement for the following lab work before leaving today: cbc  We have sent the following medications to your pharmacy for you to pick up at your convenience:  Anusol [facility] Suppositories

## 2013-11-10 NOTE — Telephone Encounter (Signed)
Spoke with pt.  She states that she has not taken glucophage x 6 months. Thinks she stopped medication when she transferred care to [place] in [place]. Pt is agreeable to discuss this at next visit in March. Pt states Dr Henrene Pastor recently told pt to double up on her iron supplement and she is requesting that we send in rx with new directions. Advised pt that she should get rx with new directions from Dr Henrene Pastor if he is making adjustment to her medication. Pt became angry and stated she would not call him that we should be able to do this as her primary care and hung up the phone.  Please advise.

## 2013-11-13 MED ORDER — FERROUS SULFATE 325 (65 FE) MG PO TABS: 325.0000 mg | ORAL_TABLET | Freq: Two times a day (BID) | ORAL | Status: DC

## 2013-11-13 NOTE — Telephone Encounter (Signed)
Refill of iron sent to [facility].

## 2013-11-15 ENCOUNTER — Other Ambulatory Visit: Payer: Self-pay | Admitting: Family Medicine

## 2013-12-15 ENCOUNTER — Encounter: Payer: Self-pay | Admitting: Family Medicine

## 2013-12-15 ENCOUNTER — Ambulatory Visit (INDEPENDENT_AMBULATORY_CARE_PROVIDER_SITE_OTHER): Payer: Medicare Other | Admitting: Family Medicine

## 2013-12-15 VITALS — BP 110/50 | HR 75 | Temp 99.3°F | Ht 65.0 in | Wt 131.1 lb

## 2013-12-15 DIAGNOSIS — R509 Fever, unspecified: Secondary | ICD-10-CM

## 2013-12-15 DIAGNOSIS — E785 Hyperlipidemia, unspecified: Secondary | ICD-10-CM

## 2013-12-15 DIAGNOSIS — R899 Unspecified abnormal finding in specimens from other organs, systems and tissues: Secondary | ICD-10-CM

## 2013-12-15 DIAGNOSIS — A0472 Enterocolitis due to Clostridium difficile, not specified as recurrent: Secondary | ICD-10-CM

## 2013-12-15 DIAGNOSIS — R300 Dysuria: Secondary | ICD-10-CM

## 2013-12-15 DIAGNOSIS — R05 Cough: Secondary | ICD-10-CM

## 2013-12-15 DIAGNOSIS — D649 Anemia, unspecified: Secondary | ICD-10-CM

## 2013-12-15 DIAGNOSIS — E119 Type 2 diabetes mellitus without complications: Secondary | ICD-10-CM

## 2013-12-15 DIAGNOSIS — I350 Nonrheumatic aortic (valve) stenosis: Secondary | ICD-10-CM

## 2013-12-15 DIAGNOSIS — R059 Cough, unspecified: Secondary | ICD-10-CM

## 2013-12-15 DIAGNOSIS — R6889 Other general symptoms and signs: Secondary | ICD-10-CM

## 2013-12-15 DIAGNOSIS — R1013 Epigastric pain: Secondary | ICD-10-CM

## 2013-12-15 DIAGNOSIS — K219 Gastro-esophageal reflux disease without esophagitis: Secondary | ICD-10-CM

## 2013-12-15 DIAGNOSIS — N390 Urinary tract infection, site not specified: Secondary | ICD-10-CM

## 2013-12-15 DIAGNOSIS — F17200 Nicotine dependence, unspecified, uncomplicated: Secondary | ICD-10-CM

## 2013-12-15 DIAGNOSIS — I359 Nonrheumatic aortic valve disorder, unspecified: Secondary | ICD-10-CM

## 2013-12-15 LAB — CBC
HCT: 32.5 % — ABNORMAL LOW (ref 36.0–46.0)
Hemoglobin: 10.7 g/dL — ABNORMAL LOW (ref 12.0–15.0)
MCH: 25.4 pg — ABNORMAL LOW (ref 26.0–34.0)
MCHC: 32.9 g/dL (ref 30.0–36.0)
MCV: 77.2 fL — ABNORMAL LOW (ref 78.0–100.0)
Platelets: 116 10*3/uL — ABNORMAL LOW (ref 150–400)
RBC: 4.21 MIL/uL (ref 3.87–5.11)
RDW: 19.4 % — ABNORMAL HIGH (ref 11.5–15.5)
WBC: 12.3 10*3/uL — ABNORMAL HIGH (ref 4.0–10.5)

## 2013-12-15 LAB — RENAL FUNCTION PANEL
Albumin: 3 g/dL — ABNORMAL LOW (ref 3.5–5.2)
BUN: 16 mg/dL (ref 6–23)
CALCIUM: 8.3 mg/dL — AB (ref 8.4–10.5)
CO2: 28 mEq/L (ref 19–32)
Chloride: 103 mEq/L (ref 96–112)
Creat: 0.96 mg/dL (ref 0.50–1.10)
Glucose, Bld: 155 mg/dL — ABNORMAL HIGH (ref 70–99)
PHOSPHORUS: 2.6 mg/dL (ref 2.3–4.6)
Potassium: 3.6 mEq/L (ref 3.5–5.3)
SODIUM: 135 meq/L (ref 135–145)

## 2013-12-15 LAB — POCT INFLUENZA A/B
INFLUENZA B, POC: NEGATIVE
Influenza A, POC: NEGATIVE

## 2013-12-15 LAB — HEPATIC FUNCTION PANEL
ALT: 11 U/L (ref 0–35)
AST: 23 U/L (ref 0–37)
Albumin: 3 g/dL — ABNORMAL LOW (ref 3.5–5.2)
Alkaline Phosphatase: 82 U/L (ref 39–117)
Bilirubin, Direct: 0.4 mg/dL — ABNORMAL HIGH (ref 0.0–0.3)
Indirect Bilirubin: 0.9 mg/dL (ref 0.2–1.2)
TOTAL PROTEIN: 6.3 g/dL (ref 6.0–8.3)
Total Bilirubin: 1.3 mg/dL — ABNORMAL HIGH (ref 0.2–1.2)

## 2013-12-15 LAB — SEDIMENTATION RATE: SED RATE: 60 mm/h — AB (ref 0–22)

## 2013-12-15 MED ORDER — TRAMADOL HCL 50 MG PO TABS: 100.0000 mg | ORAL_TABLET | Freq: Three times a day (TID) | ORAL | Status: DC | PRN

## 2013-12-15 NOTE — Patient Instructions (Signed)
1 motrin in morning with food and 2 Tramadol at bedtime  Urinary Tract Infection Urinary tract infections (UTIs) can develop anywhere along your urinary tract. Your urinary tract is your body's drainage system for removing wastes and extra water. Your urinary tract includes two kidneys, two ureters, a bladder, and a urethra. Your kidneys are a pair of bean-shaped organs. Each kidney is about the size of your fist. They are located below your ribs, one on each side of your spine. CAUSES Infections are caused by microbes, which are microscopic organisms, including fungi, viruses, and bacteria. These organisms are so small that they can only be seen through a microscope. Bacteria are the microbes that most commonly cause UTIs. SYMPTOMS  Symptoms of UTIs may vary by age and gender of the patient and by the location of the infection. Symptoms in young women typically include a frequent and intense urge to urinate and a painful, burning feeling in the bladder or urethra during urination. Older women and men are more likely to be tired, shaky, and weak and have muscle aches and abdominal pain. A fever may mean the infection is in your kidneys. Other symptoms of a kidney infection include pain in your back or sides below the ribs, nausea, and vomiting. DIAGNOSIS To diagnose a UTI, your caregiver will ask you about your symptoms. Your caregiver also will ask to provide a urine sample. The urine sample will be tested for bacteria and white blood cells. White blood cells are made by your body to help fight infection. TREATMENT  Typically, UTIs can be treated with medication. Because most UTIs are caused by a bacterial infection, they usually can be treated with the use of antibiotics. The choice of antibiotic and length of treatment depend on your symptoms and the type of bacteria causing your infection. HOME CARE INSTRUCTIONS  If you were prescribed antibiotics, take them exactly as your caregiver instructs you.  Finish the medication even if you feel better after you have only taken some of the medication.  Drink enough water and fluids to keep your urine clear or pale yellow.  Avoid caffeine, tea, and carbonated beverages. They tend to irritate your bladder.  Empty your bladder often. Avoid holding urine for long periods of time.  Empty your bladder before and after sexual intercourse.  After a bowel movement, women should cleanse from front to back. Use each tissue only once. SEEK MEDICAL CARE IF:   You have back pain.  You develop a fever.  Your symptoms do not begin to resolve within 3 days. SEEK IMMEDIATE MEDICAL CARE IF:   You have severe back pain or lower abdominal pain.  You develop chills.  You have nausea or vomiting.  You have continued burning or discomfort with urination. MAKE SURE YOU:   Understand these instructions.  Will watch your condition.  Will get help right away if you are not doing well or get worse. Document Released: 07/01/2005 Document Revised: 03/22/2012 Document Reviewed: 10/30/2011 [facility] Patient Information 2014 [employer].

## 2013-12-15 NOTE — Progress Notes (Signed)
Pre visit review using our clinic review tool, if applicable. No additional management support is needed unless otherwise documented below in the visit note. 

## 2013-12-15 NOTE — Progress Notes (Signed)
Patient ID: Monique Fleming, female   DOB: October 24, 1928, 80 y.o.   MRN: [id number] Monique Fleming [id number] 15-Sep-1929 12/15/2013      Progress Note-Follow Up  Subjective  Chief Complaint  Chief Complaint  Patient presents with  . Influenza    scratchy throat and chest congetsion X 5 days,X 3 body aches and head ache, temp this morning 101 degrees took 2 ibuprofen at 7:30 AM   . Urinary Tract Infection    burning with urination- up in the middle of the night X 2 days    HPI  Patient is a 80 year old female in today for routine medical care. In today with numerous c/o. Has been acutely ill this week with fevers, malasie, sob at rest and with exertion. Tmax of 101. C/o dysuria. Congestion noted with clear sputum. No cp/palp/ has noted intermittent diarrhea this week. Denies n/v/constipation.   Past Medical History  Diagnosis Date  . Aortic stenosis     moderate by echo 6/11 gradient 20/8mmHg for mean and peak  . TIA (transient ischemic attack)     Dr. [name]  . Peripheral vascular disease   . IBS (irritable bowel syndrome)   . Diabetes mellitus     type II  . Chronic bronchitis   . COPD (chronic obstructive pulmonary disease)   . Atherosclerosis   . Depression   . Anxiety     Dr Casimiro Needle  . Renal cyst   . Anemia     iron deficiency  . Cellulitis   . Diverticular disease   . Infectious diarrhea(009.2)   . Sinusitis   . Thrush   . Abnormal liver function test   . GERD (gastroesophageal reflux disease)   . Insomnia     chronic  . Adenocarcinoma 2008    colon,s/p right hemicolectomy  . Angina   . Seizures     hx of one seizure "  . Cirrhosis of liver     hx of  . Clostridium difficile diarrhea   . Thrombocytopenia 04/01/2013    Past Surgical History  Procedure Laterality Date  . Carotid endarterectomy    . Colectomy      right hemi  . Cholecystectomy  11/09    Rt Breast  . Upper gastrointestinal endoscopy      Family History  Problem Relation Age of Onset   . Colon cancer Paternal Grandmother   . Esophageal cancer Brother   . Heart disease Mother   . Diabetes Brother   . Hypertension Father   . Rectal cancer Neg Hx   . Stomach cancer Neg Hx     History   Social History  . Marital Status: Widowed    Spouse Name: N/A    Number of Children: N/A  . Years of Education: N/A   Occupational History  . retired    Social History Main Topics  . Smoking status: Former Smoker -- 0.50 packs/day    Types: Cigarettes    Quit date: 03/29/2012  . Smokeless tobacco: Current User     Comment: Using Electronic cigarette  . Alcohol Use: No  . Drug Use: No  . Sexual Activity: No   Other Topics Concern  . Not on file   Social History Narrative   Patient signed a Designated Party Release to allow her daughter, Makenya Sferrazza, to have access to her medical records/information. Entered by Fleet Contras March 24,2011 @ 2:47 pm   Dailly Caffeine Use    Current Outpatient Prescriptions on File  Prior to Visit  Medication Sig Dispense Refill  . Cholecalciferol (VITAMIN D3) 1000 UNITS tablet Take 1,000 Units by mouth daily.        . cholestyramine (QUESTRAN) 4 G packet Take 4 g by mouth 2 (two) times daily as needed (diarrhea).      . clonazePAM (KLONOPIN) 1 MG tablet Take 1 mg by mouth at bedtime.      . clopidogrel (PLAVIX) 75 MG tablet Take 1 tablet (75 mg total) by mouth daily.  90 tablet  1  . Cyanocobalamin (VITAMIN B-12) 500 MCG SUBL Place 1 tablet (500 mcg total) under the tongue 1 day or 1 dose.  90 tablet  3  . ferrous sulfate 325 (65 FE) MG tablet Take 1 tablet (325 mg total) by mouth 2 (two) times daily with a meal.  60 tablet  2  . Fluticasone-Salmeterol (ADVAIR DISKUS) 100-50 MCG/DOSE AEPB Inhale 1 puff into the lungs 2 (two) times daily.  180 each  3  . glucose blood test strip Use as instructed  100 each  3  . hydrocortisone (ANUSOL-HC) 25 MG suppository Place 1 suppository (25 mg total) rectally at bedtime.  30 suppository  0  .  ibuprofen (ADVIL,MOTRIN) 200 MG tablet Take 400 mg by mouth 3 (three) times daily as needed (pain).      . Multiple Vitamin (MULTIVITAMIN WITH MINERALS) TABS tablet Take 1 tablet by mouth daily.      . phenytoin (DILANTIN) 100 MG ER capsule TAKE ONE CAPSULE EACH MORNING AND TAKE TWO CAPSULES BY MOUTH AT BEDTIME DAILY  90 capsule  1  . potassium chloride (KLOR-CON 10) 10 MEQ tablet Take 1 tablet (10 mEq total) by mouth 2 (two) times daily.  90 tablet  1  . SALINE NA Place 2 drops into the nose See admin instructions. Use every night at bedtime and one additional time during the day if needed for congestion      . traMADol (ULTRAM) 50 MG tablet Take 1 tablet (50 mg total) by mouth every 6 (six) hours as needed for moderate pain or severe pain.  60 tablet  1   No current facility-administered medications on file prior to visit.    Allergies  Allergen Reactions  . Bactrim [Sulfamethoxazole-Trimethoprim] Shortness Of Breath  . Levofloxacin Other (See Comments)    seizure  . Pneumovax [Pneumococcal Polysaccharides] Other (See Comments)    Severe allergy-cellulitis  . Amoxicillin-Pot Clavulanate Other (See Comments)    Patient stated Drs. recommend taking Augmentin due to her PCN allergy.  . Penicillins Rash  . Haldol [Haloperidol Lactate]     Had unknown reaction many yrs ago per pt  . Morphine And Related     Confused and agitation  . Sudafed [Pseudoephedrine Hcl] Other (See Comments)    seizure    Review of Systems  Review of Systems  Constitutional: Negative for fever and malaise/fatigue.  HENT: Negative for congestion.   Eyes: Negative for discharge.  Respiratory: Negative for shortness of breath.   Cardiovascular: Negative for chest pain, palpitations and leg swelling.  Gastrointestinal: Negative for nausea, abdominal pain and diarrhea.  Genitourinary: Negative for dysuria.  Musculoskeletal: Negative for falls.  Skin: Negative for rash.  Neurological: Negative for loss of  consciousness and headaches.  Endo/Heme/Allergies: Negative for polydipsia.  Psychiatric/Behavioral: Negative for depression and suicidal ideas. The patient is not nervous/anxious and does not have insomnia.     Objective  BP 110/50  Pulse 75  Temp(Src) 99.3 F (37.4 C) (Oral)  Ht 5\' 5"  (1.651 m)  Wt 131 lb 1.3 oz (59.457 kg)  BMI 21.81 kg/m2  SpO2 95%  Physical Exam  Physical Exam  Constitutional: She is oriented to person, place, and time and well-developed, well-nourished, and in no distress. No distress.  HENT:  Head: Normocephalic and atraumatic.  Eyes: Conjunctivae are normal.  Neck: Neck supple. No thyromegaly present.  Cardiovascular: Normal rate, regular rhythm and normal heart sounds.   No murmur heard. Pulmonary/Chest: Effort normal and breath sounds normal. She has no wheezes.  Abdominal: She exhibits no distension and no mass.  Musculoskeletal: She exhibits no edema.  Lymphadenopathy:    She has no cervical adenopathy.  Neurological: She is alert and oriented to person, place, and time.  Skin: Skin is warm and dry. No rash noted. She is not diaphoretic.  Psychiatric: Memory, affect and judgment normal.    Lab Results  Component Value Date   TSH 1.714 10/03/2013   Lab Results  Component Value Date   WBC 6.9 11/09/2013   HGB 10.5* 11/09/2013   HCT 33.0* 11/09/2013   MCV 79.5 11/09/2013   PLT 161.0 11/09/2013   Lab Results  Component Value Date   CREATININE 0.73 10/03/2013   BUN 10 10/03/2013   NA 138 10/03/2013   K 4.0 10/03/2013   CL 102 10/03/2013   CO2 29 10/03/2013   Lab Results  Component Value Date   ALT 17 10/03/2013   AST 31 10/03/2013   ALKPHOS 101 10/03/2013   BILITOT 0.3 10/03/2013   Lab Results  Component Value Date   CHOL 130 08/10/2012   Lab Results  Component Value Date   HDL 22* 08/10/2012   Lab Results  Component Value Date   LDLCALC 76 08/10/2012   Lab Results  Component Value Date   TRIG 159* 08/10/2012   Lab Results   Component Value Date   CHOLHDL 5.9 08/10/2012     Assessment & Plan  DIABETES MELLITUS, TYPE II hgba1c acceptable, minimize simple carbs. Increase exercise as tolerated  GERD Avoid offending foods, start probiotics. Do not eat large meals in late evening and consider raising head of bed.   Burning with urination Urine culture negative. Encouraged increased hydration, start a probiotic  UTI (lower urinary tract infection) May need to follow up with [facility]  C. difficile colitis Encouraged probiotics and minimize simple carbs.  Fever, unspecified Likely viral URI, flu test negative. Encouraged increased rest and hydration, add probiotics, zinc such as Coldeze or Xicam. Treat fevers as needed

## 2013-12-16 LAB — URINALYSIS
Glucose, UA: NEGATIVE mg/dL
Hgb urine dipstick: NEGATIVE
Ketones, ur: NEGATIVE mg/dL
NITRITE: NEGATIVE
Protein, ur: 100 mg/dL — AB
SPECIFIC GRAVITY, URINE: 1.02 (ref 1.005–1.030)
UROBILINOGEN UA: 1 mg/dL (ref 0.0–1.0)
pH: 6 (ref 5.0–8.0)

## 2013-12-16 LAB — URINE CULTURE
COLONY COUNT: NO GROWTH
ORGANISM ID, BACTERIA: NO GROWTH

## 2013-12-17 DIAGNOSIS — I350 Nonrheumatic aortic (valve) stenosis: Secondary | ICD-10-CM | POA: Insufficient documentation

## 2013-12-17 DIAGNOSIS — R300 Dysuria: Secondary | ICD-10-CM | POA: Insufficient documentation

## 2013-12-17 DIAGNOSIS — R509 Fever, unspecified: Secondary | ICD-10-CM | POA: Insufficient documentation

## 2013-12-17 NOTE — Assessment & Plan Note (Signed)
Encouraged probiotics and minimize simple carbs.

## 2013-12-17 NOTE — Assessment & Plan Note (Signed)
Avoid offending foods, start probiotics. Do not eat large meals in late evening and consider raising head of bed.  

## 2013-12-17 NOTE — Assessment & Plan Note (Signed)
hgba1c acceptable, minimize simple carbs. Increase exercise as tolerated.  

## 2013-12-17 NOTE — Assessment & Plan Note (Signed)
May need to follow up with [facility]

## 2013-12-17 NOTE — Assessment & Plan Note (Signed)
Likely viral URI, flu test negative. Encouraged increased rest and hydration, add probiotics, zinc such as Coldeze or Xicam. Treat fevers as needed

## 2013-12-17 NOTE — Assessment & Plan Note (Signed)
Urine culture negative. Encouraged increased hydration, start a probiotic

## 2013-12-18 ENCOUNTER — Encounter: Payer: Self-pay | Admitting: Hematology & Oncology

## 2013-12-18 ENCOUNTER — Encounter: Payer: Self-pay | Admitting: *Deleted

## 2013-12-18 NOTE — Progress Notes (Signed)
Pt called to report she saw Dr. Charlett Blake last Friday and her Hgb was 10.7.  Dr.Ennever made aware.

## 2013-12-19 ENCOUNTER — Other Ambulatory Visit: Payer: Self-pay | Admitting: Neurology

## 2013-12-19 DIAGNOSIS — G459 Transient cerebral ischemic attack, unspecified: Secondary | ICD-10-CM

## 2013-12-19 NOTE — Addendum Note (Signed)
Addended by: Varney Daily on: 12/19/2013 03:17 PM   Modules accepted: Orders

## 2013-12-20 ENCOUNTER — Ambulatory Visit: Payer: Medicare Other | Admitting: Cardiovascular Disease

## 2013-12-21 ENCOUNTER — Ambulatory Visit: Payer: Medicare Other | Admitting: Internal Medicine

## 2013-12-25 ENCOUNTER — Ambulatory Visit: Payer: Medicare Other | Admitting: Family Medicine

## 2013-12-28 ENCOUNTER — Ambulatory Visit: Payer: Medicare Other | Admitting: Family Medicine

## 2014-01-02 ENCOUNTER — Encounter: Payer: Self-pay | Admitting: Cardiovascular Disease

## 2014-01-02 ENCOUNTER — Ambulatory Visit (INDEPENDENT_AMBULATORY_CARE_PROVIDER_SITE_OTHER): Payer: Medicare Other | Admitting: Cardiovascular Disease

## 2014-01-02 VITALS — BP 176/84 | HR 88 | Ht 65.5 in | Wt 126.8 lb

## 2014-01-02 DIAGNOSIS — I779 Disorder of arteries and arterioles, unspecified: Secondary | ICD-10-CM

## 2014-01-02 DIAGNOSIS — I359 Nonrheumatic aortic valve disorder, unspecified: Secondary | ICD-10-CM

## 2014-01-02 DIAGNOSIS — I350 Nonrheumatic aortic (valve) stenosis: Secondary | ICD-10-CM

## 2014-01-02 DIAGNOSIS — I739 Peripheral vascular disease, unspecified: Secondary | ICD-10-CM

## 2014-01-02 DIAGNOSIS — E785 Hyperlipidemia, unspecified: Secondary | ICD-10-CM

## 2014-01-02 NOTE — Assessment & Plan Note (Signed)
Moderate disease that appears stable  On plavix  F/u Dr [name]  Should be due to have f/u soon as last one done in August

## 2014-01-02 NOTE — Patient Instructions (Signed)

## 2014-01-02 NOTE — Assessment & Plan Note (Signed)
Cholesterol is at goal.  Continue current dose of statin and diet Rx.  No myalgias or side effects.  F/U  LFT's in 6 months. Lab Results  Component Value Date   LDLCALC 76 08/10/2012  Labs with primary

## 2014-01-02 NOTE — Assessment & Plan Note (Signed)
Not severe by exam f/u echo She would not appear to be an operative candidate

## 2014-01-02 NOTE — Progress Notes (Signed)
Patient ID: Monique Fleming, female   DOB: 1928-10-10, 80 y.o.   MRN: [id number]  80 yo referred by Dr Randel Pigg for vascular disease and AS.  She has a history of CVA , cognitive impairment, and seizure . Her last carotid duplex 8/14 showed bilateral 50-60% ICA disease.  She is a poor historian  Denies dyspnea, chest pain syncope or palpitations.  In 2012 only mild AS with mean gradient 12 mmHg and normal EF 70%.  She has not had f/u since then  She is on dilantin for seizures  On plavix for stroke prevention     ROS: Denies fever, malais, weight loss, blurry vision, decreased visual acuity, cough, sputum, SOB, hemoptysis, pleuritic pain, palpitaitons, heartburn, abdominal pain, melena, lower extremity edema, claudication, or rash.  All other systems reviewed and negative   General: Affect appropriate Frail demented white female  HEENT: normal Neck supple with no adenopathy JVP normal no bruits no thyromegaly Lungs clear with no wheezing and good diaphragmatic motion Heart:  S1/S2 preserved AS  murmur,rub, gallop or click PMI normal Abdomen: benighn, BS positve, no tenderness, no AAA no bruit.  No HSM or HJR Distal pulses intact with no bruits No edema Neuro non-focal Skin warm and dry No muscular weakness  Medications Current Outpatient Prescriptions  Medication Sig Dispense Refill  . Cholecalciferol (VITAMIN D3) 1000 UNITS tablet Take 1,000 Units by mouth daily.        . cholestyramine (QUESTRAN) 4 G packet Take 4 g by mouth 2 (two) times daily as needed (diarrhea).      . clonazePAM (KLONOPIN) 1 MG tablet Take 1 mg by mouth at bedtime.      . clopidogrel (PLAVIX) 75 MG tablet Take 1 tablet (75 mg total) by mouth daily.  90 tablet  1  . Cyanocobalamin (VITAMIN B-12) 500 MCG SUBL Place 1 tablet (500 mcg total) under the tongue 1 day or 1 dose.  90 tablet  3  . ferrous sulfate 325 (65 FE) MG tablet Take 1 tablet (325 mg total) by mouth 2 (two) times daily with a meal.  60 tablet  2  .  Fluticasone-Salmeterol (ADVAIR DISKUS) 100-50 MCG/DOSE AEPB Inhale 1 puff into the lungs 2 (two) times daily.  180 each  3  . glucose blood test strip Use as instructed  100 each  3  . ibuprofen (ADVIL,MOTRIN) 200 MG tablet Take 400 mg by mouth 3 (three) times daily as needed (pain).      . Multiple Vitamin (MULTIVITAMIN WITH MINERALS) TABS tablet Take 1 tablet by mouth daily.      . phenytoin (DILANTIN) 100 MG ER capsule TAKE ONE CAPSULE EACH MORNING AND TAKE TWO CAPSULES BY MOUTH AT BEDTIME DAILY  90 capsule  1  . potassium chloride (KLOR-CON 10) 10 MEQ tablet Take 1 tablet (10 mEq total) by mouth 2 (two) times daily.  90 tablet  1  . SALINE NA Place 2 drops into the nose See admin instructions. Use every night at bedtime and one additional time during the day if needed for congestion      . traMADol (ULTRAM) 50 MG tablet Take 2 tablets (100 mg total) by mouth every 8 (eight) hours as needed for moderate pain or severe pain.  60 tablet  1   No current facility-administered medications for this visit.    Allergies Bactrim; Levofloxacin; Pneumovax; Amoxicillin-pot clavulanate; Penicillins; Haldol; Morphine and related; and Sudafed  Family History: Family History  Problem Relation Age of Onset  .  Colon cancer Paternal Grandmother   . Esophageal cancer Brother   . Heart disease Mother   . Diabetes Brother   . Hypertension Father   . Rectal cancer Neg Hx   . Stomach cancer Neg Hx     Social History: History   Social History  . Marital Status: Widowed    Spouse Name: N/A    Number of Children: N/A  . Years of Education: N/A   Occupational History  . retired    Social History Main Topics  . Smoking status: Former Smoker -- 0.50 packs/day    Types: Cigarettes    Quit date: 03/29/2012  . Smokeless tobacco: Current User     Comment: Using Electronic cigarette  . Alcohol Use: No  . Drug Use: No  . Sexual Activity: No   Other Topics Concern  . Not on file   Social History  Narrative   Patient signed a Designated Party Release to allow her daughter, Monique Fleming, to have access to her medical records/information. Entered by Fleet Contras March 24,2011 @ 2:47 pm   Dailly Caffeine Use    Electrocardiogram:  Assessment and Plan

## 2014-01-08 DIAGNOSIS — F411 Generalized anxiety disorder: Secondary | ICD-10-CM | POA: Insufficient documentation

## 2014-01-12 LAB — HM DIABETES EYE EXAM

## 2014-01-15 ENCOUNTER — Other Ambulatory Visit: Payer: Self-pay | Admitting: Family Medicine

## 2014-01-16 ENCOUNTER — Encounter: Payer: Self-pay | Admitting: Cardiology

## 2014-01-16 ENCOUNTER — Ambulatory Visit (HOSPITAL_COMMUNITY): Payer: Medicare Other | Attending: Cardiology | Admitting: Cardiology

## 2014-01-16 DIAGNOSIS — I350 Nonrheumatic aortic (valve) stenosis: Secondary | ICD-10-CM

## 2014-01-16 DIAGNOSIS — I359 Nonrheumatic aortic valve disorder, unspecified: Secondary | ICD-10-CM | POA: Insufficient documentation

## 2014-01-16 NOTE — Telephone Encounter (Signed)
Please advise? Last RX was done on 12-15-13 quantity 60 with 1 refill

## 2014-01-16 NOTE — Telephone Encounter (Signed)
Does she know she has a refill?

## 2014-01-16 NOTE — Progress Notes (Signed)
Echo performed. 

## 2014-01-17 NOTE — Telephone Encounter (Signed)
Spoke with Levada Dy at [facility].  She states they never received refill dated 12/15/13. Pt last filled rx in January 2015. Faxed rx.

## 2014-01-18 ENCOUNTER — Encounter: Payer: Self-pay | Admitting: Family Medicine

## 2014-01-22 ENCOUNTER — Telehealth: Payer: Self-pay | Admitting: Internal Medicine

## 2014-01-22 NOTE — Telephone Encounter (Signed)
Pt states she is having bad reflux, nausea and gas. States that sometimes the food rolls up her throat and she has to spit it out. States she took something years ago for reflux but it went away. Pt scheduled to see Tye Savoy NP tomorrow at 11am. Pt aware of appt.

## 2014-01-23 ENCOUNTER — Encounter: Payer: Self-pay | Admitting: Nurse Practitioner

## 2014-01-23 ENCOUNTER — Ambulatory Visit: Payer: Medicare Other | Admitting: Nurse Practitioner

## 2014-01-23 ENCOUNTER — Ambulatory Visit (INDEPENDENT_AMBULATORY_CARE_PROVIDER_SITE_OTHER): Payer: Medicare Other | Admitting: Nurse Practitioner

## 2014-01-23 VITALS — BP 140/70 | HR 70 | Ht 65.0 in | Wt 129.8 lb

## 2014-01-23 DIAGNOSIS — K219 Gastro-esophageal reflux disease without esophagitis: Secondary | ICD-10-CM

## 2014-01-23 MED ORDER — PANTOPRAZOLE SODIUM 40 MG PO TBEC: 40.0000 mg | DELAYED_RELEASE_TABLET | Freq: Every day | ORAL | Status: DC

## 2014-01-23 MED ORDER — ONDANSETRON HCL 4 MG PO TABS: 4.0000 mg | ORAL_TABLET | Freq: Four times a day (QID) | ORAL | Status: DC | PRN

## 2014-01-23 NOTE — Progress Notes (Signed)
     History of Present Illness:  Patient is an 80 year old female with multiple, significant medical problems including, but not limited to diabetes mellitus, prior CVA, aortic stenosis, COPD, cirrhosis, colon cancer status post right hemicolectomy, chronic diarrhea, anxiety, depression, colonic AVMs, chronic anemia, prior cholecystectomy, prior endarterectomy. On chronic Plavix therapy  Here for nausea, poor appetite and reflux. A review of the records shows that the nausea is chronic, dating back to at least July 2013. In September 2014 she had a CTscan for evaluation of fatigue and weight loss. No acute abnormalities found. In addition to nausea patient gives a very vague description of what she refer to as "reflux" . She isn't having heartburn but does describe occasional regurgitation of small food particles. She also describes feeling like throat /esophagus is very full in the morning.   Weight is down only 2 pounds since mid March.   Last upper endoscopy in February 2013 to screen for varices was remarkable only for mild portal gastropathy  Current Medications, Allergies, Past Medical History, Past Surgical History, Family History and Social History were reviewed in [facility] record.  Physical Exam: General: Pleasant, well developed , white female in no acute distress Head: Normocephalic and atraumatic Eyes:  sclerae anicteric, conjunctiva pink  Ears: Normal auditory acuity Lungs: Clear throughout to auscultation Heart: Regular rate and rhythm, +murmur Abdomen: Soft, non distended, non-tender. No masses, no hepatomegaly. Normal bowel sounds Musculoskeletal: Symmetrical with no gross deformities  Extremities: No edema  Neurological: Alert oriented x 4, grossly nonfocal Psychological:  Alert and cooperative. Normal mood and affect  Assessment and Recommendations:   60. 80 year old with and chronic nausea and a very vague description of what she refer to as  "reflux" . No pyrosis but occasional regurgitation of small food particles. Also describes sensation of "full" throat /esophagus in the mornings.  No dysphagia. Patient doesn't take anything for GERD, will try daily PPI ( before evening meal). Trial of Zofran for nausea. Patient to follow up with Dr. Henrene Pastor in a couple of months. Call in interim if not improving.   2. Carotid disease, on Plavix.   3. Multiple, significant medical problems as listed above. Cirrhosis appears compensated at this time

## 2014-01-23 NOTE — Patient Instructions (Addendum)
We have sent the following medications to your pharmacy for you to pick up at your convenience: Protonix, Zofran  Information on GERD is below for your review.  Follow up visit is scheduled with Dr. Henrene Pastor for 03-14-2014 at 67 am. _______________________________________________________________________________________________________  Diet for Gastroesophageal Reflux Disease, Adult Reflux (acid reflux) is when acid from your stomach flows up into the esophagus. When acid comes in contact with the esophagus, the acid causes irritation and soreness (inflammation) in the esophagus. When reflux happens often or so severely that it causes damage to the esophagus, it is called gastroesophageal reflux disease (GERD). Nutrition therapy can help ease the discomfort of GERD. FOODS OR DRINKS TO AVOID OR LIMIT  Smoking or chewing tobacco. Nicotine is one of the most potent stimulants to acid production in the gastrointestinal tract.  Caffeinated and decaffeinated coffee and black tea.  Regular or low-calorie carbonated beverages or energy drinks (caffeine-free carbonated beverages are allowed).   Strong spices, such as black pepper, white pepper, red pepper, cayenne, curry powder, and chili powder.  Peppermint or spearmint.  Chocolate.  High-fat foods, including meats and fried foods. Extra added fats including oils, butter, salad dressings, and nuts. Limit these to less than 8 tsp per day.  Fruits and vegetables if they are not tolerated, such as citrus fruits or tomatoes.  Alcohol.  Any food that seems to aggravate your condition. If you have questions regarding your diet, call your caregiver or a registered dietitian. OTHER THINGS THAT MAY HELP GERD INCLUDE:   Eating your meals slowly, in a relaxed setting.  Eating 5 to 6 small meals per day instead of 3 large meals.  Eliminating food for a period of time if it causes distress.  Not lying down until 3 hours after eating a  meal.  Keeping the head of your bed raised 6 to 9 inches (15 to 23 cm) by using a foam wedge or blocks under the legs of the bed. Lying flat may make symptoms worse.  Being physically active. Weight loss may be helpful in reducing reflux in overweight or obese adults.  Wear loose fitting clothing EXAMPLE MEAL PLAN This meal plan is approximately 2,000 calories based on [email or web address] meal planning guidelines. Breakfast   cup cooked oatmeal.  1 cup strawberries.  1 cup low-fat milk.  1 oz almonds. Snack  1 cup cucumber slices.  6 oz yogurt (made from low-fat or fat-free milk). Lunch  2 slice whole-wheat bread.  2 oz sliced [place].  2 tsp mayonnaise.  1 cup blueberries.  1 cup snap peas. Snack  6 whole-wheat crackers.  1 oz string cheese. Dinner   cup brown rice.  1 cup mixed veggies.  1 tsp olive oil.  3 oz grilled fish. Document Released: 09/21/2005 Document Revised: 12/14/2011 Document Reviewed: 08/07/2011 [facility] Patient Information 2014 [place], [place].

## 2014-01-24 ENCOUNTER — Telehealth: Payer: Self-pay | Admitting: Cardiovascular Disease

## 2014-01-24 NOTE — Progress Notes (Signed)
Quick Note:  Patient notified of results from ECHO and discussed next recommended appt/repeat ECHO in one year. Patient verbalized agreement with current treatment plan. ______

## 2014-01-24 NOTE — Telephone Encounter (Signed)
Patient called requesting results from ECHO (01/16/14).  Patient notified of the results and patient wanted to know when next appt advised. Provided patient with Dr. Kyla Fleming instructions that she should follow up in one year with appt and repeat ECHO. Also advised her that should she have any questions or concerns to please call the office any time.  Patient verbalized appreciation and agreement.

## 2014-01-24 NOTE — Telephone Encounter (Signed)
New message  ° ° °Patient calling for test results.   °

## 2014-01-24 NOTE — Progress Notes (Signed)
Agree with initial assessment and plans 

## 2014-01-31 ENCOUNTER — Encounter: Payer: Self-pay | Admitting: Neurology

## 2014-01-31 ENCOUNTER — Ambulatory Visit: Payer: Medicare Other | Admitting: Nurse Practitioner

## 2014-01-31 ENCOUNTER — Ambulatory Visit (INDEPENDENT_AMBULATORY_CARE_PROVIDER_SITE_OTHER): Payer: Self-pay

## 2014-01-31 ENCOUNTER — Ambulatory Visit (INDEPENDENT_AMBULATORY_CARE_PROVIDER_SITE_OTHER): Payer: Medicare Other | Admitting: Neurology

## 2014-01-31 VITALS — BP 155/67 | HR 89 | Ht 65.0 in | Wt 126.0 lb

## 2014-01-31 DIAGNOSIS — G459 Transient cerebral ischemic attack, unspecified: Secondary | ICD-10-CM

## 2014-01-31 DIAGNOSIS — I6529 Occlusion and stenosis of unspecified carotid artery: Secondary | ICD-10-CM

## 2014-01-31 DIAGNOSIS — Z0289 Encounter for other administrative examinations: Secondary | ICD-10-CM

## 2014-01-31 NOTE — Patient Instructions (Addendum)
Continue Plavix for secondary prevention and strict control of hypertension her blood pressure goal below 130/90 and lipids with LDL goal below 100 mg percent. Patient's mild memory loss appears to be stable  Hence will recommend conservative followup for now. Check carotid Doppler studies on a yearly basis. Continue Dilantin 200 mg at bedtime for seizure prophylaxis despite a low level as she has had only a single seizure 4 years ago. I discussed with the patient and daughter possibility of tapering and discontinuing Dilantin with a 50-50 chance of seizure recurrence. The patient and daughter do not want to take that chance at the current time. Return for followup in 6 months with Monique Fleming,, nurse practitioner or call earlier if necessary

## 2014-01-31 NOTE — Progress Notes (Signed)
[facility]  PATIENT: Monique Fleming DOB: 09-11-1929   REASON FOR VISIT: follow up HISTORY FROM: patient  HISTORY OF PRESENT ILLNESS: [name] is a 80 year old Caucasian female with history of stroke, mild cognitive impairment, depression, aortic stenosis and remote history of seizure. She continues to do well without any recurrent seizure or significant worsening of memory. She does crossword puzzles to keep her so busy and cognitively challenging tasks. She states in the last 2 years since her husband has died and she has been this interested in activities with little energy but she has 2 daughters to check on her regularly. She lives alone in a condominium and is still driving. She has not been seen in the office since November 2012.  She comes in the office today for new problem of right neck pain. She states she has history of stenosis and bilateral carotid arteries with endarterectomy on the left. She feels that neck pain is 2 to blockage on the right carotid. Her last carotid duplex was January 2013, which showed 50-69% stenosis of the left distal CCA and significant stenotic flow in the right ECA. Heterogeneous and acoustic shadowing I asked were observed bilaterally. Transcranial Doppler studies from January 2013 was suggestive of diffuse atherosclerosis. She states that the pain is intermittent but every day. She states the pain is sharp and burning in nature only on the right side starting at the base of her neck and shooting up behind the right ear. She states the pain is intense it makes her clench her teeth. She states she has found nothing to make the pain better. She denies any recent injury or muscle strain from lifting.   UPDATE 07/24/13 (LL):  Monique Fleming comes in for revisit for neck pain.  She had a repeat carotid doppler study which shows bilateral 50-69% stenosis.  Her neck pain has since resolved.  She tells me that she has quit smoking but the room  smells strongly of cigarettes.  She reports that Dr. Marin Olp was worried about her weight loss at last check up and had ordered a CT chest and pelvis, which was negative for metastatic disease.  She also reports that she had a fall in September on gravel in which she had a laceration over her left eye and many scrapes and bruises, but no fractures Update 01/31/2014 :. She returns for followup of the last visit 6 months ago. She is accompanied by daughter. She continues to do well from a neurovascular standpoint without recurrent stroke or TIA symptoms. She has had a few falls. She missed a step in August and has  chronic hip pain and has undergone cortisone injections but is still without relief. She is currently getting outpatient physical therapy for her hip and walking. She had followup carotid ultrasound done today in the office which shows stable appearance of the 50-69% bilateral ICA stenosis which is unchanged from the last ultrasound. She states her blood pressure is well controlled. She is tolerating Plavix without bleeding bruising or other side effects. She had lipid profile checked a few months ago by her primary physician and was apparently fine. She has chronic anemia and fatigue and does follow up with her oncologist Dr. Marin Olp for prior history of colon cancer. She states her memory difficulties are unchanged not progressive. She had a single seizure about 4 or 5 years ago and is on Dilantin 200 mg at night for that. She is had dilantin level checked her primary  physician a few months ago which was low and she wanted to increase the  dosage but I do not agree with this.. I had long discussion with the patient and daughter regarding possibility of tapering and stopping Dilantin since she has been seen seizure-free more than 4 years but the patient is not willing to take the chance of breakthrough seizures like to continue Dilantin and the current dose.  Review of Systems  Constitutional: Positive  for fatigue and unexpected weight change (loss).  HENT: Positive for hearing loss and tinnitus.   Respiratory: Positive for cough and shortness of breath.   Gastrointestinal: Positive for diarrhea and blood in stool.  Genitourinary: Positive for difficulty urinating.  Neurological: Positive for tremors (Mostly in left hand - just starting in right hand).  Hematological: Bruises/bleeds easily.       Anemia    ALLERGIES: Allergies  Allergen Reactions  . Bactrim [Sulfamethoxazole-Trimethoprim] Shortness Of Breath  . Levofloxacin Other (See Comments)    seizure  . Pneumovax [Pneumococcal Polysaccharides] Other (See Comments)    Severe allergy-cellulitis  . Amoxicillin-Pot Clavulanate Other (See Comments)    Patient stated Drs. recommend taking Augmentin due to her PCN allergy.  . Penicillins Rash  . Haldol [Haloperidol Lactate]     Had unknown reaction many yrs ago per pt  . Morphine And Related     Confused and agitation  . Sudafed [Pseudoephedrine Hcl] Other (See Comments)    seizure    HOME MEDICATIONS: Outpatient Prescriptions Prior to Visit  Medication Sig Dispense Refill  . cholestyramine (QUESTRAN) 4 G packet Take 4 g by mouth 2 (two) times daily as needed (diarrhea).      . clonazePAM (KLONOPIN) 1 MG tablet Take 1 mg by mouth at bedtime.      . clopidogrel (PLAVIX) 75 MG tablet Take 1 tablet (75 mg total) by mouth daily.  90 tablet  1  . Cyanocobalamin (VITAMIN B-12) 500 MCG SUBL Place 1 tablet (500 mcg total) under the tongue 1 day or 1 dose.  90 tablet  3  . ferrous sulfate 325 (65 FE) MG tablet Take 1 tablet (325 mg total) by mouth 2 (two) times daily with a meal.  60 tablet  2  . Fluticasone-Salmeterol (ADVAIR DISKUS) 100-50 MCG/DOSE AEPB Inhale 1 puff into the lungs 2 (two) times daily.  180 each  3  . glucose blood test strip Use as instructed  100 each  3  . Multiple Vitamin (MULTIVITAMIN WITH MINERALS) TABS tablet Take 1 tablet by mouth daily.      . ondansetron  (ZOFRAN) 4 MG tablet Take 1 tablet (4 mg total) by mouth every 6 (six) hours as needed for nausea or vomiting.  30 tablet  1  . pantoprazole (PROTONIX) 40 MG tablet Take 1 tablet (40 mg total) by mouth daily.  30 tablet  3  . phenytoin (DILANTIN) 100 MG ER capsule TAKE ONE CAPSULE EACH MORNING AND TAKE TWO CAPSULES BY MOUTH AT BEDTIME DAILY  90 capsule  1  . SALINE NA Place 2 drops into the nose See admin instructions. Use every night at bedtime and one additional time during the day if needed for congestion       No facility-administered medications prior to visit.    PAST MEDICAL HISTORY: Past Medical History  Diagnosis Date  . Aortic stenosis     moderate by echo 6/11 gradient 20/49mmHg for mean and peak  . TIA (transient ischemic attack)     Dr. [name]  .  Peripheral vascular disease   . IBS (irritable bowel syndrome)   . Diabetes mellitus     type II  . Chronic bronchitis   . COPD (chronic obstructive pulmonary disease)   . Atherosclerosis   . Depression   . Anxiety     Dr Casimiro Needle  . Renal cyst   . Anemia     iron deficiency  . Cellulitis   . Diverticular disease   . Infectious diarrhea(009.2)   . Sinusitis   . Thrush   . Abnormal liver function test   . GERD (gastroesophageal reflux disease)   . Insomnia     chronic  . Adenocarcinoma 2008    colon,s/p right hemicolectomy  . Angina   . Seizures     hx of one seizure "  . Cirrhosis of liver     hx of  . Clostridium difficile diarrhea   . Thrombocytopenia 04/01/2013  . Bursitis     spine per pt    PAST SURGICAL HISTORY: Past Surgical History  Procedure Laterality Date  . Carotid endarterectomy    . Colectomy      right hemi  . Cholecystectomy  11/09    Rt Breast  . Upper gastrointestinal endoscopy      FAMILY HISTORY: Family History  Problem Relation Age of Onset  . Colon cancer Paternal Grandmother   . Esophageal cancer Brother   . Heart disease Mother   . Diabetes Brother   . Hypertension Father    . Rectal cancer Neg Hx   . Stomach cancer Neg Hx     SOCIAL HISTORY: History   Social History  . Marital Status: Widowed    Spouse Name: N/A    Number of Children: 3  . Years of Education: N/A   Occupational History  . retired   .     Social History Main Topics  . Smoking status: Former Smoker -- 0.50 packs/day    Types: Cigarettes    Quit date: 03/29/2012  . Smokeless tobacco: Current User     Comment: Using Electronic cigarette  . Alcohol Use: No  . Drug Use: No  . Sexual Activity: No   Other Topics Concern  . Not on file   Social History Narrative   Patient signed a Designated Party Release to allow her daughter, Monique Fleming, to have access to her medical records/information. Entered by Fleet Contras March 24,2011 @ 2:47 pm   Dailly Caffeine Use     PHYSICAL EXAM  Filed Vitals:   01/31/14 1521  BP: 155/67  Pulse: 89  Height: 5\' 5"  (1.651 m)  Weight: 126 lb (57.153 kg)   Body mass index is 20.97 kg/(m^2).  Generalized: In no acute distress, pleasant elderly Caucasian female  Neck: Supple, bilateral carotid bruits vs. Aortic stenosis  Cardiac: Regular rate rhythm, 3/6 murmur  Pulmonary: Expiratory wheezes  Musculoskeletal: No deformity   Neurological examination  Mentation: Alert oriented to time, place, history taking, language fluent, and casual conversation  Cranial nerve II-XII: Pupils were equal round reactive to light extraocular movements were full, visual field were full on confrontational test. facial sensation and strength were normal. HEARING DECREASED bilaterally. Uvula tongue midline. head turning and shoulder shrug and were normal and symmetric.  MOTOR: normal bulk and tone, full strength in the BUE, BLE, fine finger movements normal, no pronator drift  SENSORY: normal and symmetric to light touch, pinprick, temperature, vibration  COORDINATION: finger-nose-finger, heel-to-shin bilaterally, there was no truncal ataxia  REFLEXES: DTRs  symmetric  and equal bilaterally.  GAIT/STATION: Rising up from seated position without assistance, normal stance, without trunk ataxia, moderate stride, good arm swing, smooth turning, able to perform tiptoe, and heel walking without difficulty. TANDEM UNSTEADY.   DIAGNOSTIC DATA (LABS, IMAGING, TESTING) - I reviewed patient records, labs, notes, testing and imaging myself where available.  Lab Results  Component Value Date   WBC 12.3* 12/15/2013   HGB 10.7* 12/15/2013   HCT 32.5* 12/15/2013   MCV 77.2* 12/15/2013   PLT 116* 12/15/2013      Component Value Date/Time   NA 135 12/15/2013 0956   NA 141 06/15/2013 1458   K 3.6 12/15/2013 0956   K 4.3 06/15/2013 1458   CL 103 12/15/2013 0956   CL 104 06/15/2013 1458   CO2 28 12/15/2013 0956   CO2 27 06/15/2013 1458   GLUCOSE 155* 12/15/2013 0956   GLUCOSE 179* 06/15/2013 1458   BUN 16 12/15/2013 0956   BUN 10 06/15/2013 1458   CREATININE 0.96 12/15/2013 0956   CREATININE 0.81 09/23/2013 1915   CALCIUM 8.3* 12/15/2013 0956   CALCIUM 8.9 06/15/2013 1458   PROT 6.3 12/15/2013 0956   PROT 8.0 06/27/2010 0809   ALBUMIN 3.0* 12/15/2013 0956   ALBUMIN 3.0* 12/15/2013 0956   AST 23 12/15/2013 0956   AST 52* 06/27/2010 0809   ALT 11 12/15/2013 0956   ALT 26 06/27/2010 0809   ALKPHOS 82 12/15/2013 0956   ALKPHOS 74 06/27/2010 0809   BILITOT 1.3* 12/15/2013 0956   BILITOT 0.70 06/27/2010 0809   GFRNONAA 66* 09/23/2013 1915   GFRAA 76* 09/23/2013 1915   Lab Results  Component Value Date   CHOL 130 08/10/2012   HDL 22* 08/10/2012   LDLCALC 76 08/10/2012   LDLDIRECT 124.3 03/20/2010   TRIG 159* 08/10/2012   CHOLHDL 5.9 08/10/2012   Lab Results  Component Value Date   HGBA1C 6.9* 10/03/2013   Lab Results  Component Value Date   VITAMINB12 420 10/20/2011   Lab Results  Component Value Date   TSH 1.714 10/03/2013   CAROTID DUPLEX STUDY 10/07/11  The study is consistent with 50-69% stenosis of the left distal CCA and significant stenotic flow in the right  ECA. Heterogeneous and acoustic shadow plaques were observed bilaterally.  TCD 10/07/11  This limited study is consistent with diminished CBFV pattern seen in the right MCA M2. The cause and clinical significance of this finding is unclear. In addition, abnormally elevated CBFVs in the right/left OA's and VA's, but without TCD signs of stenosis. Elevated the eyes are suggestive of diffuse atherosclerosis.  04/19/13 2D ECHO The estimated ejection fraction was in the range of 65% to 70%. Doppler parameters are consistent with abnormal left ventricular relaxation (grade 1 diastolic dysfunction). 06/14/13 CT CHEST IMPRESSION  No evidence for metastatic disease to the chest. Emphysema. Coronary artery calcifications.  06/14/13 CT ABDOMEN AND PELVIS IMPRESSION  No evidence for metastatic disease to the abdomen or pelvis. Morphologic features of the liver compatible with cirrhosis and portal venous hypertension. 06/21/13 CT HEAD WITHOUT CONTRAST  Scalp soft tissue injury without underlying fracture.    No acute traumatic injury to the brain.  Mild for age chronic small vessel disease has mildly progressed since 2009.  06/21/13  CT CERVICAL SPINE IMPRESSION  No acute fracture or listhesis identified in the cervical spine.  Ligamentous injury is not excluded.  ASSESSMENT AND PLAN [name] is a 80 year old Caucasian female with history of stroke, mild cognitive impairment, depression,  aortic stenosis, carotid stenosis and remote history of seizure.  Bilateral ICA stenosis, 50-69%.   PLAN:   Continue Plavix for secondary prevention and strict control of hypertension her blood pressure goal below 130/90 and lipids with LDL goal below 100 mg percent. Patient's mild memory loss appears to be stable  Hence will recommend conservative followup for now. Check carotid Doppler studies on a yearly basis. Continue Dilantin 200 mg at bedtime for seizure prophylaxis despite a low level as she has had only a single  seizure 4 years ago. I discussed with the patient and daughter possibility of tapering and discontinuing Dilantin  with a 50-50 chance of seizure recurrence. The patient and daughter do not want to take that chance at the current time. Return for followup in 6 months with Charlott Holler,, nurse practitioner or call earlier if necessary   Antony Contras, MD  01/31/2014, 5:31 PM [facility] [address], [place], [place] [address] [phone]

## 2014-02-07 ENCOUNTER — Telehealth: Payer: Self-pay | Admitting: Nurse Practitioner

## 2014-02-07 NOTE — Telephone Encounter (Signed)
Called patient with carotid doppler results, no change from preious study, repeat in 1 year.  Patient acknowledged results and had no further questions.

## 2014-02-11 ENCOUNTER — Encounter: Payer: Self-pay | Admitting: Family Medicine

## 2014-02-12 ENCOUNTER — Other Ambulatory Visit: Payer: Self-pay | Admitting: Family Medicine

## 2014-02-12 DIAGNOSIS — L719 Rosacea, unspecified: Secondary | ICD-10-CM

## 2014-02-12 MED ORDER — METRONIDAZOLE 1 % EX GEL: Freq: Every day | CUTANEOUS | Status: DC

## 2014-02-13 ENCOUNTER — Telehealth: Payer: Self-pay | Admitting: Family Medicine

## 2014-02-13 ENCOUNTER — Encounter: Payer: Self-pay | Admitting: Family Medicine

## 2014-02-13 ENCOUNTER — Ambulatory Visit (INDEPENDENT_AMBULATORY_CARE_PROVIDER_SITE_OTHER): Payer: Medicare Other | Admitting: Family Medicine

## 2014-02-13 VITALS — BP 104/64 | HR 97 | Temp 98.2°F | Ht 65.0 in | Wt 127.1 lb

## 2014-02-13 DIAGNOSIS — I350 Nonrheumatic aortic (valve) stenosis: Secondary | ICD-10-CM

## 2014-02-13 DIAGNOSIS — E785 Hyperlipidemia, unspecified: Secondary | ICD-10-CM

## 2014-02-13 DIAGNOSIS — K219 Gastro-esophageal reflux disease without esophagitis: Secondary | ICD-10-CM

## 2014-02-13 DIAGNOSIS — E119 Type 2 diabetes mellitus without complications: Secondary | ICD-10-CM

## 2014-02-13 DIAGNOSIS — I6529 Occlusion and stenosis of unspecified carotid artery: Secondary | ICD-10-CM

## 2014-02-13 DIAGNOSIS — R569 Unspecified convulsions: Secondary | ICD-10-CM

## 2014-02-13 DIAGNOSIS — D649 Anemia, unspecified: Secondary | ICD-10-CM

## 2014-02-13 DIAGNOSIS — H3530 Unspecified macular degeneration: Secondary | ICD-10-CM

## 2014-02-13 DIAGNOSIS — I359 Nonrheumatic aortic valve disorder, unspecified: Secondary | ICD-10-CM

## 2014-02-13 DIAGNOSIS — N390 Urinary tract infection, site not specified: Secondary | ICD-10-CM

## 2014-02-13 LAB — RENAL FUNCTION PANEL
ALBUMIN: 3.4 g/dL — AB (ref 3.5–5.2)
BUN: 6 mg/dL (ref 6–23)
CO2: 29 meq/L (ref 19–32)
CREATININE: 0.78 mg/dL (ref 0.50–1.10)
Calcium: 8.8 mg/dL (ref 8.4–10.5)
Chloride: 102 mEq/L (ref 96–112)
Glucose, Bld: 180 mg/dL — ABNORMAL HIGH (ref 70–99)
Phosphorus: 3.3 mg/dL (ref 2.3–4.6)
Potassium: 3.8 mEq/L (ref 3.5–5.3)
Sodium: 139 mEq/L (ref 135–145)

## 2014-02-13 LAB — CBC
HCT: 35.2 % — ABNORMAL LOW (ref 36.0–46.0)
HEMOGLOBIN: 11.4 g/dL — AB (ref 12.0–15.0)
MCH: 26.5 pg (ref 26.0–34.0)
MCHC: 32.4 g/dL (ref 30.0–36.0)
MCV: 81.7 fL (ref 78.0–100.0)
Platelets: 232 10*3/uL (ref 150–400)
RBC: 4.31 MIL/uL (ref 3.87–5.11)
RDW: 17 % — ABNORMAL HIGH (ref 11.5–15.5)
WBC: 9.7 10*3/uL (ref 4.0–10.5)

## 2014-02-13 LAB — LIPID PANEL
CHOL/HDL RATIO: 3.3 ratio
Cholesterol: 130 mg/dL (ref 0–200)
HDL: 39 mg/dL — ABNORMAL LOW (ref >39)
LDL Cholesterol: 54 mg/dL (ref 0–99)
Triglycerides: 185 mg/dL — ABNORMAL HIGH (ref <150)
VLDL: 37 mg/dL (ref 0–40)

## 2014-02-13 LAB — HEPATIC FUNCTION PANEL
ALBUMIN: 3.4 g/dL — AB (ref 3.5–5.2)
ALK PHOS: 122 U/L — AB (ref 39–117)
ALT: 13 U/L (ref 0–35)
AST: 35 U/L (ref 0–37)
BILIRUBIN DIRECT: 0.1 mg/dL (ref 0.0–0.3)
Indirect Bilirubin: 0.3 mg/dL (ref 0.2–1.2)
Total Bilirubin: 0.4 mg/dL (ref 0.2–1.2)
Total Protein: 7.2 g/dL (ref 6.0–8.3)

## 2014-02-13 LAB — TSH: TSH: 1.376 u[IU]/mL (ref 0.350–4.500)

## 2014-02-13 MED ORDER — PHENYTOIN SODIUM EXTENDED 100 MG PO CAPS: 200.0000 mg | ORAL_CAPSULE | Freq: Every day | ORAL | Status: DC

## 2014-02-13 NOTE — Progress Notes (Signed)
Pre visit review using our clinic review tool, if applicable. No additional management support is needed unless otherwise documented below in the visit note. 

## 2014-02-13 NOTE — Telephone Encounter (Signed)
Patient never picked up rx for shower chair. rx shredded

## 2014-02-13 NOTE — Patient Instructions (Signed)
Letta Moynahan

## 2014-02-14 LAB — URINALYSIS
Bilirubin Urine: NEGATIVE
Glucose, UA: NEGATIVE mg/dL
Hgb urine dipstick: NEGATIVE
Ketones, ur: NEGATIVE mg/dL
Leukocytes, UA: NEGATIVE
Nitrite: NEGATIVE
Protein, ur: 30 mg/dL — AB
Specific Gravity, Urine: 1.011 (ref 1.005–1.030)
Urobilinogen, UA: 0.2 mg/dL (ref 0.0–1.0)
pH: 7 (ref 5.0–8.0)

## 2014-02-14 LAB — HEMOGLOBIN A1C
HEMOGLOBIN A1C: 6.4 % — AB (ref <5.7)
MEAN PLASMA GLUCOSE: 137 mg/dL — AB (ref <117)

## 2014-02-14 LAB — PHENYTOIN LEVEL, TOTAL: Phenytoin Lvl: <2.5 ug/mL — ABNORMAL LOW (ref 10.0–20.0)

## 2014-02-15 ENCOUNTER — Telehealth: Payer: Self-pay

## 2014-02-15 DIAGNOSIS — R748 Abnormal levels of other serum enzymes: Secondary | ICD-10-CM

## 2014-02-15 NOTE — Telephone Encounter (Signed)
Lab order placed per md

## 2014-02-17 LAB — CULTURE, URINE COMPREHENSIVE: Colony Count: 70000

## 2014-02-18 ENCOUNTER — Encounter: Payer: Self-pay | Admitting: Family Medicine

## 2014-02-18 DIAGNOSIS — H3530 Unspecified macular degeneration: Secondary | ICD-10-CM | POA: Insufficient documentation

## 2014-02-18 HISTORY — DX: Unspecified macular degeneration: H35.30

## 2014-02-18 NOTE — Assessment & Plan Note (Signed)
Stable, being seen by neuro every 6 months. Dilantin levels low but appear to be therapeutic, no changes

## 2014-02-18 NOTE — Assessment & Plan Note (Signed)
Being seen by opthamology every 6 months stable

## 2014-02-18 NOTE — Assessment & Plan Note (Signed)
Encouraged heart healthy diet, increase exercise, avoid trans fats, consider a krill oil cap daily 

## 2014-02-18 NOTE — Assessment & Plan Note (Signed)
Being monitored by cardiology, doing well

## 2014-02-18 NOTE — Assessment & Plan Note (Signed)
Recent episode was painful. Has not ben severely recurrent. Avoid offending foods, start probiotics. Do not eat large meals in late evening and consider raising head of bed. Continue protonix

## 2014-02-18 NOTE — Assessment & Plan Note (Signed)
A1C is 6.4 hgba1c acceptable, minimize simple carbs. Increase exercise as tolerated.

## 2014-02-18 NOTE — Progress Notes (Signed)
Patient ID: Monique Fleming, female   DOB: 1928-12-07, 80 y.o.   MRN: [id number] Monique Fleming [id number] [birth year] 02/18/2014      Progress Note-Follow Up  Subjective  Chief Complaint  Chief Complaint  Patient presents with  . Follow-up    HPI  Patient is a 80 year old female in today for routine medical care. She is generally doing well but did have an episode of back reflux recently with some substernal chest pain after a large meal. This has not been recurrent in today she feels well. She denies chest pain, palpitations, shortness of breath. She struggles somewhat with fatigue but has been following closely with cardiology and neurology. No recent seizure activity or change in medications. He is struggling with some intermittent joint and back pain but finds the pain tolerable.  Past Medical History  Diagnosis Date  . Aortic stenosis     moderate by echo 6/11 gradient 20/78mmHg for mean and peak  . TIA (transient ischemic attack)     Dr. [name]  . Peripheral vascular disease   . IBS (irritable bowel syndrome)   . Diabetes mellitus     type II  . Chronic bronchitis   . COPD (chronic obstructive pulmonary disease)   . Atherosclerosis   . Depression   . Anxiety     Dr Casimiro Needle  . Renal cyst   . Anemia     iron deficiency  . Cellulitis   . Diverticular disease   . Infectious diarrhea(009.2)   . Sinusitis   . Thrush   . Abnormal liver function test   . GERD (gastroesophageal reflux disease)   . Insomnia     chronic  . Adenocarcinoma 2008    colon,s/p right hemicolectomy  . Angina   . Seizures     hx of one seizure "  . Cirrhosis of liver     hx of  . Clostridium difficile diarrhea   . Thrombocytopenia 04/01/2013  . Bursitis     spine per pt  . Macular degeneration 02/18/2014    Follows with Louanne Belton    Past Surgical History  Procedure Laterality Date  . Carotid endarterectomy    . Colectomy      right hemi  . Cholecystectomy  11/09    Rt Breast  . Upper  gastrointestinal endoscopy      Family History  Problem Relation Age of Onset  . Colon cancer Paternal Grandmother   . Esophageal cancer Brother   . Heart disease Mother   . Diabetes Brother   . Hypertension Father   . Rectal cancer Neg Hx   . Stomach cancer Neg Hx     History   Social History  . Marital Status: Widowed    Spouse Name: N/A    Number of Children: 3  . Years of Education: N/A   Occupational History  . retired   .     Social History Main Topics  . Smoking status: Former Smoker -- 0.50 packs/day    Types: Cigarettes    Quit date: 03/29/2012  . Smokeless tobacco: Current User     Comment: Using Electronic cigarette  . Alcohol Use: No  . Drug Use: No  . Sexual Activity: No   Other Topics Concern  . Not on file   Social History Narrative   Patient signed a Designated Party Release to allow her daughter, Monique Fleming, to have access to her medical records/information. Entered by Fleet Contras March 24,2011 @ 2:47 pm  Dailly Caffeine Use    Current Outpatient Prescriptions on File Prior to Visit  Medication Sig Dispense Refill  . cholestyramine (QUESTRAN) 4 G packet Take 4 g by mouth 2 (two) times daily as needed (diarrhea).      . clonazePAM (KLONOPIN) 1 MG tablet Take 1 mg by mouth at bedtime.      . clopidogrel (PLAVIX) 75 MG tablet Take 1 tablet (75 mg total) by mouth daily.  90 tablet  1  . Cyanocobalamin (VITAMIN B-12) 500 MCG SUBL Place 1 tablet (500 mcg total) under the tongue 1 day or 1 dose.  90 tablet  3  . ferrous sulfate 325 (65 FE) MG tablet Take 1 tablet (325 mg total) by mouth 2 (two) times daily with a meal.  60 tablet  2  . Fluticasone-Salmeterol (ADVAIR DISKUS) 100-50 MCG/DOSE AEPB Inhale 1 puff into the lungs 2 (two) times daily.  180 each  3  . glucose blood test strip Use as instructed  100 each  3  . metroNIDAZOLE (METROGEL) 1 % gel Apply topically daily.  45 g  1  . Multiple Vitamin (MULTIVITAMIN WITH MINERALS) TABS tablet Take 1  tablet by mouth daily.      . ondansetron (ZOFRAN) 4 MG tablet Take 1 tablet (4 mg total) by mouth every 6 (six) hours as needed for nausea or vomiting.  30 tablet  1  . pantoprazole (PROTONIX) 40 MG tablet Take 1 tablet (40 mg total) by mouth daily.  30 tablet  3  . SALINE NA Place 2 drops into the nose See admin instructions. Use every night at bedtime and one additional time during the day if needed for congestion       No current facility-administered medications on file prior to visit.    Allergies  Allergen Reactions  . Bactrim [Sulfamethoxazole-Trimethoprim] Shortness Of Breath  . Levofloxacin Other (See Comments)    seizure  . Pneumovax [Pneumococcal Polysaccharides] Other (See Comments)    Severe allergy-cellulitis  . Amoxicillin-Pot Clavulanate Other (See Comments)    Patient stated Drs. recommend taking Augmentin due to her PCN allergy.  . Penicillins Rash  . Haldol [Haloperidol Lactate]     Had unknown reaction many yrs ago per pt  . Morphine And Related     Confused and agitation  . Sudafed [Pseudoephedrine Hcl] Other (See Comments)    seizure    Review of Systems  Review of Systems  Constitutional: Positive for malaise/fatigue. Negative for fever.  HENT: Negative for congestion.   Eyes: Negative for discharge.  Respiratory: Negative for shortness of breath.   Cardiovascular: Positive for chest pain. Negative for palpitations and leg swelling.  Gastrointestinal: Positive for heartburn. Negative for nausea, abdominal pain and diarrhea.  Genitourinary: Negative for dysuria.  Musculoskeletal: Negative for falls.  Skin: Negative for rash.  Neurological: Negative for loss of consciousness and headaches.  Endo/Heme/Allergies: Negative for polydipsia.  Psychiatric/Behavioral: Negative for depression and suicidal ideas. The patient is not nervous/anxious and does not have insomnia.     Objective  BP 104/64  Pulse 97  Temp(Src) 98.2 F (36.8 C) (Oral)  Ht 5\' 5"   (1.651 m)  Wt 127 lb 1.9 oz (57.661 kg)  BMI 21.15 kg/m2  SpO2 96%  Physical Exam  Physical Exam  Constitutional: She is oriented to person, place, and time and well-developed, well-nourished, and in no distress. No distress.  HENT:  Head: Normocephalic and atraumatic.  Eyes: Conjunctivae are normal.  Neck: Neck supple. No thyromegaly present.  Cardiovascular: Normal rate, regular rhythm and normal heart sounds.   No murmur heard. Pulmonary/Chest: Effort normal and breath sounds normal. She has no wheezes.  Abdominal: She exhibits no distension and no mass.  Musculoskeletal: She exhibits no edema.  Lymphadenopathy:    She has no cervical adenopathy.  Neurological: She is alert and oriented to person, place, and time.  Skin: Skin is warm and dry. No rash noted. She is not diaphoretic.  Psychiatric: Memory, affect and judgment normal.    Lab Results  Component Value Date   TSH 1.376 02/13/2014   Lab Results  Component Value Date   WBC 9.7 02/13/2014   HGB 11.4* 02/13/2014   HCT 35.2* 02/13/2014   MCV 81.7 02/13/2014   PLT 232 02/13/2014   Lab Results  Component Value Date   CREATININE 0.78 02/13/2014   BUN 6 02/13/2014   NA 139 02/13/2014   K 3.8 02/13/2014   CL 102 02/13/2014   CO2 29 02/13/2014   Lab Results  Component Value Date   ALT 13 02/13/2014   AST 35 02/13/2014   ALKPHOS 122* 02/13/2014   BILITOT 0.4 02/13/2014   Lab Results  Component Value Date   CHOL 130 02/13/2014   Lab Results  Component Value Date   HDL 39* 02/13/2014   Lab Results  Component Value Date   LDLCALC 54 02/13/2014   Lab Results  Component Value Date   TRIG 185* 02/13/2014   Lab Results  Component Value Date   CHOLHDL 3.3 02/13/2014     Assessment & Plan  SEIZURE DISORDER Stable, being seen by neuro every 6 months. Dilantin levels low but appear to be therapeutic, no changes  GERD Recent episode was painful. Has not ben severely recurrent. Avoid offending foods, start probiotics.  Do not eat large meals in late evening and consider raising head of bed. Continue protonix  Hyperlipidemia Encouraged heart healthy diet, increase exercise, avoid trans fats, consider a krill oil cap daily  DIABETES MELLITUS, TYPE II A1C is 6.4 hgba1c acceptable, minimize simple carbs. Increase exercise as tolerated.  Aortic stenosis Being monitored by cardiology, doing well  Macular degeneration Being seen by opthamology every 6 months stable

## 2014-02-19 MED ORDER — NITROFURANTOIN MONOHYD MACRO 100 MG PO CAPS: 100.0000 mg | ORAL_CAPSULE | Freq: Two times a day (BID) | ORAL | Status: DC

## 2014-02-19 NOTE — Addendum Note (Signed)
Addended by: Varney Daily on: 02/19/2014 04:55 PM   Modules accepted: Orders

## 2014-02-20 ENCOUNTER — Telehealth: Payer: Self-pay

## 2014-02-20 ENCOUNTER — Encounter: Payer: Self-pay | Admitting: Family Medicine

## 2014-02-20 NOTE — Telephone Encounter (Signed)
Patient is calling stating that she needs a letter from Dr Charlett Blake stating that she had a pneumonia shot on 03-06-11 and was severely allergic to it and was hospitalized. Pt states that she had to take a lot of different antibiotics and she got CDiff from them.  Pt states she then went to an infectious disease dr that had to have an antibiotic made for her? Pt states that a representative from the distributor will be at the pharmacy (?) tomorrow at noon and there is a good chance that she can get reimbursed for the medication.  I tried to explain to the patient that I couldn't guarantee this would be done today and Dr Charlett Blake is off tomorrow? Pt then stated that I needed to get a piece of paper with the letterhead on it and make Dr Charlett Blake sign it. I tried to explain to patient that this is not how we do things.   I informed patient that we would call and let her know what MD does/says?  Please advise?

## 2014-02-20 NOTE — Telephone Encounter (Signed)
Letter written at front desk

## 2014-03-06 DIAGNOSIS — F339 Major depressive disorder, recurrent, unspecified: Secondary | ICD-10-CM | POA: Insufficient documentation

## 2014-03-12 ENCOUNTER — Other Ambulatory Visit: Payer: Self-pay | Admitting: Family Medicine

## 2014-03-12 ENCOUNTER — Encounter: Payer: Self-pay | Admitting: Hematology & Oncology

## 2014-03-12 ENCOUNTER — Telehealth: Payer: Self-pay | Admitting: Internal Medicine

## 2014-03-12 MED ORDER — POTASSIUM CHLORIDE ER 10 MEQ PO TBCR: 10.0000 meq | EXTENDED_RELEASE_TABLET | Freq: Two times a day (BID) | ORAL | Status: DC

## 2014-03-12 NOTE — Telephone Encounter (Signed)
Not sure what's going on with her. Keep appointment

## 2014-03-12 NOTE — Telephone Encounter (Signed)
So check with her and see if she knows if she was still taking it when she did her labs in May. Her K was low normal then so if she was taking it she needs to keep taking it. If not stop

## 2014-03-12 NOTE — Telephone Encounter (Signed)
Is patient still supposed to be taking this? I don't see on med list?

## 2014-03-12 NOTE — Telephone Encounter (Signed)
Spoke with pt and she is aware.

## 2014-03-12 NOTE — Telephone Encounter (Signed)
Pt states she is having such a hard time with reflux that she can't eat anything. Pt states she is getting weak and having trouble walking due to weakness. After further discussion with pt she is able to swallow some, states she ate sherbet yesterday. Discussed with pt that she needs to continue with soft foods such as yogart, pudding, soups, ice cream etc. Pt has an appt scheduled for Wed with Dr. Henrene Pastor but was wanting to be seen sooner. Any further suggestions until her scheduled OV? Please advise.

## 2014-03-13 ENCOUNTER — Telehealth: Payer: Self-pay | Admitting: Internal Medicine

## 2014-03-13 NOTE — Telephone Encounter (Signed)
PW, is this okay with you? Please advise, thanks!

## 2014-03-13 NOTE — Telephone Encounter (Signed)
Message sent to mychart

## 2014-03-13 NOTE — Telephone Encounter (Signed)
Pt states she is not taking the potassium and wasn't when she did her labs. I informed patient that Dr Charlett Blake doesn't want her taking this. Pt voiced understanding but then asked if md wants her to take b12?  Please advise?

## 2014-03-13 NOTE — Telephone Encounter (Signed)
Spoke with the pt  She states that she lives about 5 min from the [facility] and wants to switch to Dr Joya Gaskins just b/c this is convenience  Please advise if this is okay thanks!

## 2014-03-13 NOTE — Telephone Encounter (Signed)
Sure- this is ok with me

## 2014-03-13 NOTE — Telephone Encounter (Signed)
OK to keep taking the Vitamin B12

## 2014-03-14 ENCOUNTER — Telehealth: Payer: Self-pay | Admitting: Internal Medicine

## 2014-03-14 ENCOUNTER — Ambulatory Visit: Payer: Medicare Other | Admitting: Internal Medicine

## 2014-03-14 ENCOUNTER — Encounter (HOSPITAL_COMMUNITY): Payer: Self-pay | Admitting: Emergency Medicine

## 2014-03-14 ENCOUNTER — Emergency Department (HOSPITAL_COMMUNITY): Payer: Medicare Other

## 2014-03-14 ENCOUNTER — Observation Stay (HOSPITAL_COMMUNITY)
Admission: EM | Admit: 2014-03-14 | Discharge: 2014-03-15 | Disposition: A | Discharge status: Home / Self Care | Payer: Medicare Other | Attending: Family Medicine | Admitting: Family Medicine

## 2014-03-14 DIAGNOSIS — J441 Chronic obstructive pulmonary disease with (acute) exacerbation: Principal | ICD-10-CM | POA: Insufficient documentation

## 2014-03-14 DIAGNOSIS — I739 Peripheral vascular disease, unspecified: Secondary | ICD-10-CM | POA: Insufficient documentation

## 2014-03-14 DIAGNOSIS — J449 Chronic obstructive pulmonary disease, unspecified: Secondary | ICD-10-CM

## 2014-03-14 DIAGNOSIS — R161 Splenomegaly, not elsewhere classified: Secondary | ICD-10-CM | POA: Insufficient documentation

## 2014-03-14 DIAGNOSIS — I359 Nonrheumatic aortic valve disorder, unspecified: Secondary | ICD-10-CM | POA: Insufficient documentation

## 2014-03-14 DIAGNOSIS — R0602 Shortness of breath: Secondary | ICD-10-CM | POA: Insufficient documentation

## 2014-03-14 DIAGNOSIS — Z66 Do not resuscitate: Secondary | ICD-10-CM | POA: Insufficient documentation

## 2014-03-14 DIAGNOSIS — Z8673 Personal history of transient ischemic attack (TIA), and cerebral infarction without residual deficits: Secondary | ICD-10-CM | POA: Insufficient documentation

## 2014-03-14 DIAGNOSIS — Z85038 Personal history of other malignant neoplasm of large intestine: Secondary | ICD-10-CM | POA: Insufficient documentation

## 2014-03-14 DIAGNOSIS — F3289 Other specified depressive episodes: Secondary | ICD-10-CM | POA: Insufficient documentation

## 2014-03-14 DIAGNOSIS — J9601 Acute respiratory failure with hypoxia: Secondary | ICD-10-CM

## 2014-03-14 DIAGNOSIS — K219 Gastro-esophageal reflux disease without esophagitis: Secondary | ICD-10-CM | POA: Insufficient documentation

## 2014-03-14 DIAGNOSIS — F17200 Nicotine dependence, unspecified, uncomplicated: Secondary | ICD-10-CM

## 2014-03-14 DIAGNOSIS — Z9049 Acquired absence of other specified parts of digestive tract: Secondary | ICD-10-CM | POA: Insufficient documentation

## 2014-03-14 DIAGNOSIS — H3530 Unspecified macular degeneration: Secondary | ICD-10-CM | POA: Insufficient documentation

## 2014-03-14 DIAGNOSIS — E1159 Type 2 diabetes mellitus with other circulatory complications: Secondary | ICD-10-CM | POA: Diagnosis present

## 2014-03-14 DIAGNOSIS — E119 Type 2 diabetes mellitus without complications: Secondary | ICD-10-CM | POA: Insufficient documentation

## 2014-03-14 DIAGNOSIS — F329 Major depressive disorder, single episode, unspecified: Secondary | ICD-10-CM | POA: Insufficient documentation

## 2014-03-14 DIAGNOSIS — K7460 Unspecified cirrhosis of liver: Secondary | ICD-10-CM

## 2014-03-14 DIAGNOSIS — K589 Irritable bowel syndrome without diarrhea: Secondary | ICD-10-CM | POA: Insufficient documentation

## 2014-03-14 DIAGNOSIS — J9600 Acute respiratory failure, unspecified whether with hypoxia or hypercapnia: Secondary | ICD-10-CM

## 2014-03-14 DIAGNOSIS — F411 Generalized anxiety disorder: Secondary | ICD-10-CM | POA: Insufficient documentation

## 2014-03-14 DIAGNOSIS — Z7902 Long term (current) use of antithrombotics/antiplatelets: Secondary | ICD-10-CM | POA: Insufficient documentation

## 2014-03-14 LAB — CBC WITH DIFFERENTIAL/PLATELET
Basophils Absolute: 0 10*3/uL (ref 0.0–0.1)
Basophils Relative: 0 % (ref 0–1)
EOS ABS: 0.2 10*3/uL (ref 0.0–0.7)
Eosinophils Relative: 2 % (ref 0–5)
HCT: 33.2 % — ABNORMAL LOW (ref 36.0–46.0)
Hemoglobin: 10.5 g/dL — ABNORMAL LOW (ref 12.0–15.0)
LYMPHS ABS: 1.7 10*3/uL (ref 0.7–4.0)
LYMPHS PCT: 19 % (ref 12–46)
MCH: 25.9 pg — ABNORMAL LOW (ref 26.0–34.0)
MCHC: 31.6 g/dL (ref 30.0–36.0)
MCV: 81.8 fL (ref 78.0–100.0)
Monocytes Absolute: 0.9 10*3/uL (ref 0.1–1.0)
Monocytes Relative: 10 % (ref 3–12)
NEUTROS PCT: 69 % (ref 43–77)
Neutro Abs: 5.9 10*3/uL (ref 1.7–7.7)
PLATELETS: 131 10*3/uL — AB (ref 150–400)
RBC: 4.06 MIL/uL (ref 3.87–5.11)
RDW: 15.1 % (ref 11.5–15.5)
WBC: 8.7 10*3/uL (ref 4.0–10.5)

## 2014-03-14 LAB — COMPREHENSIVE METABOLIC PANEL
ALK PHOS: 116 U/L (ref 39–117)
ALT: 11 U/L (ref 0–35)
AST: 23 U/L (ref 0–37)
Albumin: 2.7 g/dL — ABNORMAL LOW (ref 3.5–5.2)
BUN: 11 mg/dL (ref 6–23)
CO2: 26 meq/L (ref 19–32)
Calcium: 8.4 mg/dL (ref 8.4–10.5)
Chloride: 104 mEq/L (ref 96–112)
Creatinine, Ser: 0.74 mg/dL (ref 0.50–1.10)
GFR calc non Af Amer: 77 mL/min — ABNORMAL LOW (ref >90)
GFR, EST AFRICAN AMERICAN: 89 mL/min — AB (ref >90)
Glucose, Bld: 142 mg/dL — ABNORMAL HIGH (ref 70–99)
POTASSIUM: 4 meq/L (ref 3.7–5.3)
SODIUM: 142 meq/L (ref 137–147)
TOTAL PROTEIN: 6.8 g/dL (ref 6.0–8.3)
Total Bilirubin: 0.6 mg/dL (ref 0.3–1.2)

## 2014-03-14 LAB — URINE MICROSCOPIC-ADD ON

## 2014-03-14 LAB — URINALYSIS, ROUTINE W REFLEX MICROSCOPIC
Bilirubin Urine: NEGATIVE
GLUCOSE, UA: NEGATIVE mg/dL
HGB URINE DIPSTICK: NEGATIVE
Ketones, ur: NEGATIVE mg/dL
Nitrite: NEGATIVE
PH: 6 (ref 5.0–8.0)
Protein, ur: 100 mg/dL — AB
Specific Gravity, Urine: 1.02 (ref 1.005–1.030)
Urobilinogen, UA: 0.2 mg/dL (ref 0.0–1.0)

## 2014-03-14 LAB — LIPASE, BLOOD: Lipase: 59 U/L (ref 11–59)

## 2014-03-14 LAB — PRO B NATRIURETIC PEPTIDE: PRO B NATRI PEPTIDE: 604.7 pg/mL — AB (ref 0–450)

## 2014-03-14 LAB — I-STAT TROPONIN, ED: TROPONIN I, POC: 0.01 ng/mL (ref 0.00–0.08)

## 2014-03-14 LAB — I-STAT CG4 LACTIC ACID, ED: Lactic Acid, Venous: 1.17 mmol/L (ref 0.5–2.2)

## 2014-03-14 MED ORDER — PREDNISONE 20 MG PO TABS
40.0000 mg | ORAL_TABLET | Freq: Every day | ORAL | Status: DC
  Administered 2014-03-15: 40 mg via ORAL
  Filled 2014-03-14 (×2): qty 2

## 2014-03-14 MED ORDER — PANTOPRAZOLE SODIUM 40 MG PO TBEC
40.0000 mg | DELAYED_RELEASE_TABLET | Freq: Every day | ORAL | Status: DC
  Administered 2014-03-15: 40 mg via ORAL
  Filled 2014-03-14: qty 1

## 2014-03-14 MED ORDER — IPRATROPIUM BROMIDE 0.02 % IN SOLN
0.5000 mg | Freq: Once | RESPIRATORY_TRACT | Status: AC
  Administered 2014-03-14: 0.5 mg via RESPIRATORY_TRACT
  Filled 2014-03-14: qty 2.5

## 2014-03-14 MED ORDER — ALBUTEROL SULFATE (2.5 MG/3ML) 0.083% IN NEBU
5.0000 mg | INHALATION_SOLUTION | Freq: Once | RESPIRATORY_TRACT | Status: AC
  Administered 2014-03-14: 5 mg via RESPIRATORY_TRACT
  Filled 2014-03-14: qty 6

## 2014-03-14 MED ORDER — POTASSIUM CHLORIDE ER 10 MEQ PO TBCR
10.0000 meq | EXTENDED_RELEASE_TABLET | Freq: Two times a day (BID) | ORAL | Status: DC
  Administered 2014-03-15: 10 meq via ORAL
  Filled 2014-03-14 (×3): qty 1

## 2014-03-14 MED ORDER — BIOTENE DRY MOUTH MT LIQD
15.0000 mL | Freq: Two times a day (BID) | OROMUCOSAL | Status: DC
  Administered 2014-03-15 (×2): 15 mL via OROMUCOSAL

## 2014-03-14 MED ORDER — IPRATROPIUM-ALBUTEROL 0.5-2.5 (3) MG/3ML IN SOLN
3.0000 mL | Freq: Four times a day (QID) | RESPIRATORY_TRACT | Status: DC
  Administered 2014-03-14: 3 mL via RESPIRATORY_TRACT
  Filled 2014-03-14: qty 3

## 2014-03-14 MED ORDER — ALBUTEROL SULFATE (2.5 MG/3ML) 0.083% IN NEBU: 2.5000 mg | INHALATION_SOLUTION | RESPIRATORY_TRACT | Status: DC | PRN

## 2014-03-14 MED ORDER — SODIUM CHLORIDE 0.9 % IJ SOLN: 3.0000 mL | INTRAMUSCULAR | Status: DC | PRN

## 2014-03-14 MED ORDER — SODIUM CHLORIDE 0.9 % IV SOLN: 250.0000 mL | INTRAVENOUS | Status: DC | PRN

## 2014-03-14 MED ORDER — METHYLPREDNISOLONE SODIUM SUCC 125 MG IJ SOLR
125.0000 mg | Freq: Once | INTRAMUSCULAR | Status: AC
  Administered 2014-03-14: 125 mg via INTRAVENOUS
  Filled 2014-03-14: qty 2

## 2014-03-14 MED ORDER — ONDANSETRON HCL 4 MG PO TABS
4.0000 mg | ORAL_TABLET | Freq: Four times a day (QID) | ORAL | Status: DC | PRN
  Administered 2014-03-15: 4 mg via ORAL
  Filled 2014-03-14: qty 1

## 2014-03-14 MED ORDER — IOHEXOL 300 MG/ML  SOLN
80.0000 mL | Freq: Once | INTRAMUSCULAR | Status: AC | PRN
  Administered 2014-03-14: 80 mL via INTRAVENOUS

## 2014-03-14 MED ORDER — CHLORHEXIDINE GLUCONATE 0.12 % MT SOLN
15.0000 mL | Freq: Two times a day (BID) | OROMUCOSAL | Status: DC
  Administered 2014-03-14 – 2014-03-15 (×2): 15 mL via OROMUCOSAL
  Filled 2014-03-14 (×4): qty 15

## 2014-03-14 MED ORDER — ENOXAPARIN SODIUM 40 MG/0.4ML ~~LOC~~ SOLN
40.0000 mg | SUBCUTANEOUS | Status: DC
  Administered 2014-03-14: 40 mg via SUBCUTANEOUS
  Filled 2014-03-14 (×2): qty 0.4

## 2014-03-14 MED ORDER — CLOPIDOGREL BISULFATE 75 MG PO TABS
75.0000 mg | ORAL_TABLET | Freq: Every day | ORAL | Status: DC
  Administered 2014-03-14 – 2014-03-15 (×2): 75 mg via ORAL
  Filled 2014-03-14 (×2): qty 1

## 2014-03-14 MED ORDER — ONDANSETRON HCL 4 MG/2ML IJ SOLN: 4.0000 mg | Freq: Four times a day (QID) | INTRAMUSCULAR | Status: DC | PRN

## 2014-03-14 MED ORDER — SODIUM CHLORIDE 0.9 % IJ SOLN
3.0000 mL | Freq: Two times a day (BID) | INTRAMUSCULAR | Status: DC
  Administered 2014-03-14: 3 mL via INTRAVENOUS

## 2014-03-14 MED ORDER — PHENYTOIN SODIUM EXTENDED 100 MG PO CAPS
200.0000 mg | ORAL_CAPSULE | Freq: Every day | ORAL | Status: DC
  Administered 2014-03-14: 200 mg via ORAL
  Filled 2014-03-14 (×2): qty 2

## 2014-03-14 MED ORDER — PREDNISONE 20 MG PO TABS: 40.0000 mg | ORAL_TABLET | Freq: Every day | ORAL | Status: DC

## 2014-03-14 MED ORDER — NICOTINE 14 MG/24HR TD PT24
14.0000 mg | MEDICATED_PATCH | Freq: Once | TRANSDERMAL | Status: AC
  Administered 2014-03-14: 14 mg via TRANSDERMAL
  Filled 2014-03-14: qty 1

## 2014-03-14 MED ORDER — CLONAZEPAM 1 MG PO TABS
1.0000 mg | ORAL_TABLET | Freq: Every day | ORAL | Status: DC
  Administered 2014-03-14: 1 mg via ORAL
  Filled 2014-03-14: qty 1

## 2014-03-14 MED ORDER — CHOLESTYRAMINE 4 G PO PACK
4.0000 g | PACK | Freq: Two times a day (BID) | ORAL | Status: DC | PRN
  Filled 2014-03-14: qty 1

## 2014-03-14 MED ORDER — IOHEXOL 300 MG/ML  SOLN
50.0000 mL | Freq: Once | INTRAMUSCULAR | Status: AC | PRN
  Administered 2014-03-14: 50 mL via ORAL

## 2014-03-14 NOTE — ED Notes (Addendum)
Pt/family reports feeling sick for the past couple of days and has been belching a lot. Family states they took the patient to her PCP and they referred her to the ED because they thought it was not related to gastric reflux and thought it was related to her COPD. Pt reports feeling short of breath, however is speaking in short sentences and maintaining an oxygen saturation of 96% on room air. Pt denies chest pain, however reports nausea. Pt reports that she felt like something got stuck in her throat last night and forced herself to vomit last night.

## 2014-03-14 NOTE — Telephone Encounter (Signed)
Pt came into the office today for her appt.  Dr. Henrene Pastor reviewed pt's chart and listened to her lungs. Magda Paganini, Dr. Blanch Media CMA advised Pt and son in law that Pt should go to the [facility] ED per Dr. Blanch Media recommendations.

## 2014-03-14 NOTE — Progress Notes (Signed)
Received report from [facility] ED RN,  Pt arrived unit, alert and oriented, will continue with current plan of care.

## 2014-03-14 NOTE — ED Provider Notes (Signed)
Patient presented to the ER with abdominal pain, acid reflux and shortness of breath. Patient was being evaluated by her gastroenterologist today for the abdominal discomfort and was noted to be short of breath, sent to ER for further management. Patient admits to not using her albuterol nebulizers at home because they make her jittery.  Face to face Exam: HEENT - PERRLA Lungs - CTAB Heart - RRR, no M/R/G Abd - S/NT/ND Neuro - alert, oriented x3  Plan: Abdominal pain workup at work and CAT scan. Patient with persistent shortness of breath and hypoxia. Admit to the hospital for further management.   Orpah Greek, MD 03/15/14 [phone]

## 2014-03-14 NOTE — H&P (Signed)
History and Physical  Monique Fleming KNL:[id number] DOB: 04-02-1929 DOA: 03/14/2014  Referring physician: Margarita Mail, PA-C in ED PCP: Penni Homans, MD   Chief Complaint: Short of breath  HPI:  80 year old woman with history of ongoing cigarette smoking and emphysema presented to her primary gastroenterologist office for evaluation of reflux where she was found to be short of breath and subsequently sent to the emergency department. Initial evaluation suggested mild hypoxia and COPD exacerbation.  Patient reports 4-5 day history of increasing shortness of breath, wheezing and cough. She stopped all her inhalers 4 days ago after starting her nebulizer which she has not used for a long time. She says subjective fever and chills at home. She continues to smoke but has never been on oxygen. She reports last evening she got "choked" on food and then vomited. Currently she seems to be doing better.  In the emergency department afebrile with stable vital signs but hypoxic to 89% on room air with ambulation. Complete metabolic panel unremarkable, troponin within normal limits. BNP mildly elevated of unclear significance. Hemoglobin stable 10.5. Urinalysis unremarkable. CT of the abdomen and pelvis with no acute findings. EKG independently reviewed normal sinus rhythm with no acute changes.  Review of Systems:  Negative for visual changes, sore throat, rash, new muscle aches, chest pain, dysuria, bleeding, abdominal pain.   Positive for chronic diarrhea.  Past Medical History  Diagnosis Date  . Aortic stenosis     moderate by echo 6/11 gradient 20/1mmHg for mean and peak  . TIA (transient ischemic attack)     Dr. [name]  . Peripheral vascular disease   . IBS (irritable bowel syndrome)   . Diabetes mellitus     type II  . Chronic bronchitis   . COPD (chronic obstructive pulmonary disease)   . Atherosclerosis   . Depression   . Anxiety     Dr Casimiro Needle  . Renal cyst   . Anemia     iron  deficiency  . Cellulitis   . Diverticular disease   . Infectious diarrhea(009.2)   . Sinusitis   . Thrush   . Abnormal liver function test   . GERD (gastroesophageal reflux disease)   . Insomnia     chronic  . Adenocarcinoma 2008    colon,s/p right hemicolectomy  . Angina   . Seizures     hx of one seizure "  . Cirrhosis of liver     hx of  . Clostridium difficile diarrhea   . Thrombocytopenia 04/01/2013  . Bursitis     spine per pt  . Macular degeneration 02/18/2014    Follows with [facility]  . Internal hemorrhoids     Past Surgical History  Procedure Laterality Date  . Carotid endarterectomy    . Colectomy      right hemi  . Cholecystectomy  11/09    Rt Breast  . Upper gastrointestinal endoscopy      Social History:  reports that she has been smoking Cigarettes.  She has a 35 pack-year smoking history. She has never used smokeless tobacco. She reports that she does not drink alcohol or use illicit drugs.  Allergies  Allergen Reactions  . Bactrim [Sulfamethoxazole-Trimethoprim] Shortness Of Breath  . Levofloxacin Other (See Comments)    seizure  . Pneumovax [Pneumococcal Polysaccharides] Other (See Comments)    Severe allergy-cellulitis  . Amoxicillin-Pot Clavulanate Other (See Comments)    Patient stated Drs. recommend taking Augmentin due to her PCN allergy.  . Penicillins Rash  .  Haldol [Haloperidol Lactate]     Had unknown reaction many yrs ago per pt  . Morphine And Related     Confused and agitation  . Sudafed [Pseudoephedrine Hcl] Other (See Comments)    seizure    Family History  Problem Relation Age of Onset  . Colon cancer Paternal Grandmother   . Esophageal cancer Brother   . Heart disease Mother   . Diabetes Brother   . Hypertension Father   . Rectal cancer Neg Hx   . Stomach cancer Neg Hx      Prior to Admission medications   Medication Sig Start Date End Date Taking? Authorizing Provider  cholestyramine (QUESTRAN) 4 G packet Take 4 g  by mouth 2 (two) times daily as needed (diarrhea).   Yes Historical Provider, MD  clonazePAM (KLONOPIN) 1 MG tablet Take 1 mg by mouth at bedtime.   Yes Historical Provider, MD  clopidogrel (PLAVIX) 75 MG tablet Take 1 tablet (75 mg total) by mouth daily. 08/03/13  Yes Mosie Lukes, MD  cyanocobalamin 500 MCG tablet Take 500 mcg by mouth daily.   Yes Historical Provider, MD  ferrous sulfate 325 (65 FE) MG tablet Take 1 tablet (325 mg total) by mouth 2 (two) times daily with a meal. 11/13/13  Yes Debbrah Alar, NP  Fluticasone-Salmeterol (ADVAIR DISKUS) 100-50 MCG/DOSE AEPB Inhale 1 puff into the lungs 2 (two) times daily. 09/13/13  Yes Deneise Lever, MD  metroNIDAZOLE (METROGEL) 1 % gel Apply topically daily. 02/12/14  Yes Mosie Lukes, MD  Multiple Vitamin (MULTIVITAMIN WITH MINERALS) TABS tablet Take 1 tablet by mouth daily.   Yes Historical Provider, MD  ondansetron (ZOFRAN) 4 MG tablet Take 1 tablet (4 mg total) by mouth every 6 (six) hours as needed for nausea or vomiting. 01/23/14  Yes Willia Craze, NP  pantoprazole (PROTONIX) 40 MG tablet Take 1 tablet (40 mg total) by mouth daily. 01/23/14  Yes Willia Craze, NP  phenytoin (DILANTIN) 100 MG ER capsule Take 2 capsules (200 mg total) by mouth at bedtime. 02/13/14  Yes Mosie Lukes, MD  potassium chloride (KLOR-CON 10) 10 MEQ tablet Take 1 tablet (10 mEq total) by mouth 2 (two) times daily. 03/12/14  Yes Mosie Lukes, MD  SALINE NA Place 2 drops into the nose at bedtime as needed. For congestion   Yes Historical Provider, MD  predniSONE (DELTASONE) 20 MG tablet Take 2 tablets (40 mg total) by mouth daily. 03/14/14   Margarita Mail, PA-C   Physical Exam: Filed Vitals:   03/14/14 1134 03/14/14 1639  BP: 114/43 109/39  Pulse: 87 81  Temp: 99 F (37.2 C) 98.4 F (36.9 C)  TempSrc: Oral Oral  Resp: 20 16  SpO2: 96% 93%   General:Examined in the emergency department.  Appears calm and comfortable Eyes: pupils equal, round,  reactive to light. Normal lids, irises.  ENT: grossly normal hearing, lips & tongue Neck: no LAD, masses or thyromegaly Cardiovascular: RRR, no m/r/g. No LE edema. Respiratory: few wheezes, fair air movement, able to speak in short sentences but still somewhat short of breath.  Abdomen: soft, ntnd Skin: no rash or induration  Musculoskeletal: grossly normal tone BUE/BLE Psychiatric: grossly normal mood and affect, speech fluent and appropriate Neurologic: grossly non-focal.  Wt Readings from Last 3 Encounters:  02/13/14 57.661 kg (127 lb 1.9 oz)  01/31/14 57.153 kg (126 lb)  01/23/14 58.877 kg (129 lb 12.8 oz)    Labs on Admission:  Basic Metabolic  Panel:  Recent Labs Lab 03/14/14 1214  NA 142  K 4.0  CL 104  CO2 26  GLUCOSE 142*  BUN 11  CREATININE 0.74  CALCIUM 8.4    Liver Function Tests:  Recent Labs Lab 03/14/14 1214  AST 23  ALT 11  ALKPHOS 116  BILITOT 0.6  PROT 6.8  ALBUMIN 2.7*    Recent Labs Lab 03/14/14 1214  LIPASE 59    CBC:  Recent Labs Lab 03/14/14 1214  WBC 8.7  NEUTROABS 5.9  HGB 10.5*  HCT 33.2*  MCV 81.8  PLT 131*      Recent Labs  03/14/14 1219  TROPIPOC 0.01     Recent Labs  03/14/14 1214  PROBNP 604.7*     Radiological Exams on Admission: Dg Chest 2 View  03/14/2014   CLINICAL DATA:  Cough.  Shortness of breath.  EXAM: CHEST  2 VIEW  COMPARISON:  09/23/2013  FINDINGS: Heart size and pulmonary vascularity are normal. However, there are Kerley B-lines at the left lung base consistent with mild interstitial pulmonary edema. The lungs are somewhat hyperinflated consistent with COPD. No effusions. No acute osseous abnormality.  IMPRESSION: Findings consistent with slight interstitial pulmonary edema.  COPD.   Electronically Signed   By: Rozetta Nunnery M.D.   On: 03/14/2014 13:43   Ct Abdomen Pelvis W Contrast  03/14/2014   CLINICAL DATA:  Abdominal pain  EXAM: CT ABDOMEN AND PELVIS WITH CONTRAST  TECHNIQUE:  Multidetector CT imaging of the abdomen and pelvis was performed using the standard protocol following bolus administration of intravenous contrast. Oral contrast was also administered.  CONTRAST:  7mL OMNIPAQUE IOHEXOL 300 MG/ML  SOLN  COMPARISON:  June 30, 2013  FINDINGS: There is mild bullous disease in the right base. There is underlying emphysematous change with mild bibasilar atelectasis. There are multiple foci of coronary artery calcification.  No focal liver lesions are identified. Liver is prominent, measuring 16.1 cm in length. There is fatty change in the liver. The liver has a nodular contour with a prominent caudate lobe and a relatively hypoplastic left lobe. These findings suggest a degree of underlying hepatic cirrhosis. There is no biliary duct dilatation. Gallbladder is absent. There is evidence of recanalization of the umbilical vein, a finding that was also noted previously.  Spleen is enlarged, measuring 14.2 x 13.8 x 6.5 cm. No focal splenic lesions are identified.  Pancreas and adrenals appear normal.  There are multiple renal cysts bilaterally. The largest cysts are on the left side. The largest cyst measures 6.9 x 5.7 cm. The next largest cyst, also on the left, measures 5.5 x 4.4 cm. There smaller cysts on the right. The largest cyst on the right measure 3.0 x 3.0 cm. No non cystic renal masses are identified on either side. There is no hydronephrosis or calculus in either kidney. There is no ureteral calculus or ureterectasis on either side.  In the pelvis, the urinary bladder is midline with normal wall thickness. There is no pelvic mass or fluid collection. Appendix is not seen. There is no periappendiceal region inflammation.  There is no bowel obstruction.  No free air or portal venous air.  There is no ascites, adenopathy, or abscess in the abdomen or pelvis. There is atherosclerotic change in aorta. The localized plaque dissection in the infrarenal aorta is best seen currently  on slice 30 series 2, appear stable. There is slight dilatation of the distal abdominal aorta with a maximum transverse diameter of 2.9 by  2.9 cm, stable. There is also atherosclerotic change in both iliac arteries. There is degenerative change in the lumbar spine, most marked at L5-S1. There are no blastic or lytic bone lesions.  IMPRESSION: Changes of hepatic cirrhosis are again noted. Splenomegaly is also again noted. No focal liver or splenic lesions are identified. There is fatty change throughout the liver.  There is no bowel obstruction. No abscess. There is no periappendiceal region inflammation.  There are renal cysts bilaterally, larger on the left than on the right. No renal hydronephrosis. No renal or ureteral calculi.  Extensive atherosclerotic change with localized plaque dissection in the mid aorta. This is a stable finding.   Electronically Signed   By: Lowella Grip M.D.   On: 03/14/2014 14:34   Dg Abd 2 Views  03/14/2014   CLINICAL DATA:  Abdominal pain  EXAM: ABDOMEN - 2 VIEW  COMPARISON:  CT abdomen 06/30/2013  FINDINGS: Nonobstructive bowel gas pattern. Contrast in large and small bowel from CT scan today. Surgical clips right upper quadrant from cholecystectomy. No free air.  IMPRESSION: No acute abnormality.   Electronically Signed   By: Franchot Gallo M.D.   On: 03/14/2014 13:42     Principal Problem:   COPD exacerbation Active Problems:   DIABETES MELLITUS, TYPE II   COPD (chronic obstructive pulmonary disease)   Acute respiratory failure with hypoxia   Tobacco dependence   Assessment/Plan 1. COPD exacerbation with mild hypoxia.  2. Possible acute respiratory failure with hypoxia versus chronic hypoxia secondary to ongoing smoking.  3. Cirrhosis with associated splenomegaly appears stable.  4. Diabetes mellitus. appears stable, diet-controlled.  5. Extensive atherosclerotic disease of aorta, stable 6. Tobacco dependence.I recommended cessation.   Plan admission  to the medical floor, steroids, breathing treatments, oxygen if needed. Reassess need for oxygen in the morning.    She will require careful review of her outpatient medications and instruction given on proper use of bronchodilators.  Discussed in detail with daughter at bedside.  Code Status: DNR/DNI  DVT prophylaxis:Lovenox Family Communication:  Disposition Plan/Anticipated LOS: obs, 24 hours  Time spent: 55 minutes  Murray Hodgkins, MD  [facility] Pager [phone] 03/14/2014, 5:11 PM

## 2014-03-14 NOTE — Telephone Encounter (Signed)
Ok with me 

## 2014-03-14 NOTE — Discharge Instructions (Signed)
Chronic Obstructive Pulmonary Disease  Chronic obstructive pulmonary disease (COPD) is a common lung condition in which airflow from the lungs is limited. COPD is a general term that can be used to describe many different lung problems that limit airflow, including both chronic bronchitis and emphysema.  If you have COPD, your lung function will probably never return to normal, but there are measures you can take to improve lung function and make yourself feel better.   CAUSES   · Smoking (common).    · Exposure to secondhand smoke.    · Genetic problems.  · Chronic inflammatory lung diseases or recurrent infections.  SYMPTOMS   · Shortness of breath, especially with physical activity.    · Deep, persistent (chronic) cough with a large amount of thick mucus.    · Wheezing.    · Rapid breaths (tachypnea).    · Gray or bluish discoloration (cyanosis) of the skin, especially in fingers, toes, or lips.    · Fatigue.    · Weight loss.    · Frequent infections or episodes when breathing symptoms become much worse (exacerbations).    · Chest tightness.  DIAGNOSIS   Your healthcare provider will take a medical history and perform a physical examination to make the initial diagnosis.  Additional tests for COPD may include:   · Lung (pulmonary) function tests.  · Chest X-ray.  · CT scan.  · Blood tests.  TREATMENT   Treatment available to help you feel better when you have COPD include:   · Inhaler and nebulizer medicines. These help manage the symptoms of COPD and make your breathing more comfortable  · Supplemental oxygen. Supplemental oxygen is only helpful if you have a low oxygen level in your blood.    · Exercise and physical activity. These are beneficial for nearly all people with COPD. Some people may also benefit from a pulmonary rehabilitation program.  HOME CARE INSTRUCTIONS   · Take all medicines (inhaled or pills) as directed by your health care provider.  · Only take over-the-counter or prescription medicines  for pain, fever, or discomfort as directed by your health care provider.    · Avoid over-the-counter medicines or cough syrups that dry up your airway (such as antihistamines) and slow down the elimination of secretions unless instructed otherwise by your healthcare provider.    · If you are a smoker, the most important thing that you can do is stop smoking. Continuing to smoke will cause further lung damage and breathing trouble. Ask your health care provider for help with quitting smoking. He or she can direct you to community resources or hospitals that provide support.  · Avoid exposure to irritants such as smoke, chemicals, and fumes that aggravate your breathing.  · Use oxygen therapy and pulmonary rehabilitation if directed by your health care provider. If you require home oxygen therapy, ask your healthcare provider whether you should purchase a pulse oximeter to measure your oxygen level at home.    · Avoid contact with individuals who have a contagious illness.  · Avoid extreme temperature and humidity changes.  · Eat healthy foods. Eating smaller, more frequent meals and resting before meals may help you maintain your strength.  · Stay active, but balance activity with periods of rest. Exercise and physical activity will help you maintain your ability to do things you want to do.  · Preventing infection and hospitalization is very important when you have COPD. Make sure to receive all the vaccines your health care provider recommends, especially the pneumococcal and influenza vaccines. Ask your healthcare provider whether you   in (inhaling) through your nose for 1 second. Then, purse your lips as if you were going to whistle and breathe out (exhale)  through the pursed lips for 2 seconds.   Diaphragmatic breathing. Start by putting one hand on your abdomen just above your waist. Inhale slowly through your nose. The hand on your abdomen should move out. Then purse your lips and exhale slowly. You should be able to feel the hand on your abdomen moving in as you exhale.   Learn and use controlled coughing to clear mucus from your lungs. Controlled coughing is a series of short, progressive coughs. The steps of controlled coughing are:  1. Lean your head slightly forward.  2. Breathe in deeply using diaphragmatic breathing.  3. Try to hold your breath for 3 seconds.  4. Keep your mouth slightly open while coughing twice.  5. Spit any mucus out into a tissue.  6. Rest and repeat the steps once or twice as needed. SEEK MEDICAL CARE IF:   You are coughing up more mucus than usual.   There is a change in the color or thickness of your mucus.   Your breathing is more labored than usual.   Your breathing is faster than usual.  SEEK IMMEDIATE MEDICAL CARE IF:   You have shortness of breath while you are resting.    Asthma, Adult Asthma is a recurring condition in which the airways tighten and narrow. Asthma can make it difficult to breathe. It can cause coughing, wheezing, and shortness of breath. Asthma episodes (also called asthma attacks) range from minor to life-threatening. Asthma cannot be cured, but medicines and lifestyle changes can help control it. CAUSES Asthma is believed to be caused by inherited (genetic) and environmental factors, but its exact cause is unknown. Asthma may be triggered by allergens, lung infections, or irritants in the air. Asthma triggers are different for each person. Common triggers include:  Animal dander. Dust mites. Cockroaches. Pollen from trees or grass. Mold. Smoke. Air pollutants such as dust, household cleaners, hair sprays, aerosol sprays, paint fumes, strong chemicals, or strong  odors. Cold air, weather changes, and winds (which increase molds and pollens in the air). Strong emotional expressions such as crying or laughing hard. Stress. Certain medicines (such as aspirin) or types of drugs (such as beta-blockers). Sulfites in foods and drinks. Foods and drinks that may contain sulfites include dried fruit, potato chips, and sparkling grape juice. Infections or inflammatory conditions such as the flu, a cold, or an inflammation of the nasal membranes (rhinitis). Gastroesophageal reflux disease (GERD). Exercise or strenuous activity. SYMPTOMS Symptoms may occur immediately after asthma is triggered or many hours later. Symptoms include: Wheezing. Excessive nighttime or early morning coughing. Frequent or severe coughing with a common cold. Chest tightness. Shortness of breath. DIAGNOSIS  The diagnosis of asthma is made by a review of your medical history and a physical exam. Tests may also be performed. These may include: Lung function studies. These tests show how much air you breath in and out. Allergy tests. Imaging tests such as X-rays. TREATMENT  Asthma cannot be cured, but it can usually be controlled. Treatment involves identifying and avoiding your asthma triggers. It also involves medicines. There are 2 classes of medicine used for asthma treatment:  Controller medicines. These prevent asthma symptoms from occurring. They are usually taken every day. Reliever or rescue medicines. These quickly relieve asthma symptoms. They are used as needed and provide short-term relief. Your health care provider will help you  create an asthma action plan. An asthma action plan is a written plan for managing and treating your asthma attacks. It includes a list of your asthma triggers and how they may be avoided. It also includes information on when medicines should be taken and when their dosage should be changed. An action plan may also involve the use of a device called a  peak flow meter. A peak flow meter measures how well the lungs are working. It helps you monitor your condition. HOME CARE INSTRUCTIONS  Take medicine as directed by your health care provider. Speak with your health care provider if you have questions about how or when to take the medicines. Use a peak flow meter as directed by your health care provider. Record and keep track of readings. Understand and use the action plan to help minimize or stop an asthma attack without needing to seek medical care. Control your home environment in the following ways to help prevent asthma attacks: Do not smoke. Avoid being exposed to secondhand smoke. Change your heating and air conditioning filter regularly. Limit your use of fireplaces and wood stoves. Get rid of pests (such as roaches and mice) and their droppings. Throw away plants if you see mold on them. Clean your floors and dust regularly. Use unscented cleaning products. Try to have someone else vacuum for you regularly. Stay out of rooms while they are being vacuumed and for a short while afterward. If you vacuum, use a dust mask from a hardware store, a double-layered or microfilter vacuum cleaner bag, or a vacuum cleaner with a HEPA filter. Replace carpet with wood, tile, or vinyl flooring. Carpet can trap dander and dust. Use allergy-proof pillows, mattress covers, and box spring covers. Wash bed sheets and blankets every week in hot water and dry them in a dryer. Use blankets that are made of polyester or cotton. Clean bathrooms and kitchens with bleach. If possible, have someone repaint the walls in these rooms with mold-resistant paint. Keep out of the rooms that are being cleaned and painted. Wash hands frequently. SEEK MEDICAL CARE IF:  You have wheezing, shortness of breath, or a cough even if taking medicine to prevent attacks. The colored mucus you cough up (sputum) is thicker than usual. Your sputum changes from clear or white to  yellow, green, gray, or bloody. You have any problems that may be related to the medicines you are taking (such as a rash, itching, swelling, or trouble breathing). You are using a reliever medicine more than 2 3 times per week. Your peak flow is still at 50 79% of you personal best after following your action plan for 1 hour. SEEK IMMEDIATE MEDICAL CARE IF:  You seem to be getting worse and are unresponsive to treatment during an asthma attack. You are short of breath even at rest. You get short of breath when doing very little physical activity. You have difficulty eating, drinking, or talking due to asthma symptoms. You develop chest pain. You develop a fast heartbeat. You have a bluish color to your lips or fingernails. You are lightheaded, dizzy, or faint. Your peak flow is less than 50% of your personal best. You have a fever or persistent symptoms for more than 2 3 days. You have a fever and symptoms suddenly get worse. MAKE SURE YOU:  Understand these instructions. Will watch your condition. Will get help right away if you are not doing well or get worse. Document Released: 09/21/2005 Document Revised: 05/24/2013 Document Reviewed: 04/20/2013 [employer]  Patient Information 2014 [place].  You have shortness of breath that prevents you from:  Being able to talk.   Performing your usual physical activities.   You have chest pain lasting longer than 5 minutes.   Your skin color is more cyanotic than usual.  You measure low oxygen saturations for longer than 5 minutes with a pulse oximeter. MAKE SURE YOU:   Understand these instructions.  Will watch your condition.  Will get help right away if you are not doing well or get worse. Document Released: 07/01/2005 Document Revised: 07/12/2013 Document Reviewed: 05/18/2013 [facility] Patient Information 2014 [place], [place].

## 2014-03-14 NOTE — ED Provider Notes (Signed)
CSN: [id number]     Arrival date & time 03/14/14  1115 History   First MD Initiated Contact with Patient 03/14/14 1134     Chief Complaint  Patient presents with  . Shortness of Breath     (Consider location/radiation/quality/duration/timing/severity/associated sxs/prior Treatment) Patient is a 80 y.o. female presenting with shortness of breath.  Shortness of Breath Associated symptoms: abdominal pain, cough and wheezing   Associated symptoms: no chest pain, no fever, no rash and no vomiting    patient department in the emergency department by her son-in-law and daughter for shortness of breath. She had 2 weeks of worsening shortness of breath. She also has a history of reflux. The patient went to see her GI specialist today and was found to be very short of breath with wheezing and tightness. She was sent over to the emergency department for further evaluation. Her daughter states that she has a history of noncompliance with her neb treatments because they "makes me feel anxious." The patient did agree to take her nebulizer treatment last night and felt "much better." She continues to be a daily smoker. She has a chronic cough productive of white sputum. She denies any peripheral edema, history of heart failure, chest pain. She also complains of diffuse abdominal pain, fatigue and weakness. She denies any nausea, vomiting. She does have some early satiety. She has chronic belching. This is all being evaluated by her GI specialist. The patient denies fevers, chills, nausea, vomiting, diarrhea or constipation.    Past Medical History  Diagnosis Date  . Aortic stenosis     moderate by echo 6/11 gradient 20/73mmHg for mean and peak  . TIA (transient ischemic attack)     Dr. [name]  . Peripheral vascular disease   . IBS (irritable bowel syndrome)   . Diabetes mellitus     type II  . Chronic bronchitis   . COPD (chronic obstructive pulmonary disease)   . Atherosclerosis   . Depression   .  Anxiety     Dr Casimiro Needle  . Renal cyst   . Anemia     iron deficiency  . Cellulitis   . Diverticular disease   . Infectious diarrhea(009.2)   . Sinusitis   . Thrush   . Abnormal liver function test   . GERD (gastroesophageal reflux disease)   . Insomnia     chronic  . Adenocarcinoma 2008    colon,s/p right hemicolectomy  . Angina   . Seizures     hx of one seizure "  . Cirrhosis of liver     hx of  . Clostridium difficile diarrhea   . Thrombocytopenia 04/01/2013  . Bursitis     spine per pt  . Macular degeneration 02/18/2014    Follows with [facility]  . Internal hemorrhoids    Past Surgical History  Procedure Laterality Date  . Carotid endarterectomy    . Colectomy      right hemi  . Cholecystectomy  11/09    Rt Breast  . Upper gastrointestinal endoscopy     Family History  Problem Relation Age of Onset  . Colon cancer Paternal Grandmother   . Esophageal cancer Brother   . Heart disease Mother   . Diabetes Brother   . Hypertension Father   . Rectal cancer Neg Hx   . Stomach cancer Neg Hx    History  Substance Use Topics  . Smoking status: Former Smoker -- 0.50 packs/day    Types: Cigarettes  Quit date: 03/29/2012  . Smokeless tobacco: Current User     Comment: Using Electronic cigarette  . Alcohol Use: No   OB History   Grav Para Term Preterm Abortions TAB SAB Ect Mult Living                 Review of Systems  Constitutional: Positive for activity change, appetite change and fatigue. Negative for fever, chills and unexpected weight change.  HENT: Negative for trouble swallowing.   Respiratory: Positive for cough, chest tightness, shortness of breath and wheezing.   Cardiovascular: Negative for chest pain.  Gastrointestinal: Positive for abdominal pain. Negative for nausea, vomiting, diarrhea, constipation and abdominal distention.  Genitourinary: Negative for dysuria and hematuria.  Musculoskeletal: Negative for arthralgias and myalgias.  Skin:  Negative for color change and rash.  Neurological: Negative for numbness.  All other systems reviewed and are negative.     Allergies  Bactrim; Levofloxacin; Pneumovax; Amoxicillin-pot clavulanate; Penicillins; Haldol; Morphine and related; and Sudafed  Home Medications   Prior to Admission medications   Medication Sig Start Date End Date Taking? Authorizing Provider  cholestyramine (QUESTRAN) 4 G packet Take 4 g by mouth 2 (two) times daily as needed (diarrhea).    Historical Provider, MD  clonazePAM (KLONOPIN) 1 MG tablet Take 1 mg by mouth at bedtime.    Historical Provider, MD  clopidogrel (PLAVIX) 75 MG tablet Take 1 tablet (75 mg total) by mouth daily. 08/03/13   Mosie Lukes, MD  Cyanocobalamin (VITAMIN B-12) 500 MCG SUBL Place 1 tablet (500 mcg total) under the tongue 1 day or 1 dose. 05/05/13   Mosie Lukes, MD  ferrous sulfate 325 (65 FE) MG tablet Take 1 tablet (325 mg total) by mouth 2 (two) times daily with a meal. 11/13/13   Debbrah Alar, NP  Fluticasone-Salmeterol (ADVAIR DISKUS) 100-50 MCG/DOSE AEPB Inhale 1 puff into the lungs 2 (two) times daily. 09/13/13   Deneise Lever, MD  metroNIDAZOLE (METROGEL) 1 % gel Apply topically daily. 02/12/14   Mosie Lukes, MD  Multiple Vitamin (MULTIVITAMIN WITH MINERALS) TABS tablet Take 1 tablet by mouth daily.    Historical Provider, MD  ondansetron (ZOFRAN) 4 MG tablet Take 1 tablet (4 mg total) by mouth every 6 (six) hours as needed for nausea or vomiting. 01/23/14   Willia Craze, NP  pantoprazole (PROTONIX) 40 MG tablet Take 1 tablet (40 mg total) by mouth daily. 01/23/14   Willia Craze, NP  phenytoin (DILANTIN) 100 MG ER capsule Take 2 capsules (200 mg total) by mouth at bedtime. 02/13/14   Mosie Lukes, MD  potassium chloride (KLOR-CON 10) 10 MEQ tablet Take 1 tablet (10 mEq total) by mouth 2 (two) times daily. 03/12/14   Mosie Lukes, MD  SALINE NA Place 2 drops into the nose See admin instructions. Use every night  at bedtime and one additional time during the day if needed for congestion    Historical Provider, MD   BP 114/43  Pulse 87  Temp(Src) 99 F (37.2 C) (Oral)  Resp 20  SpO2 96% Physical Exam  Nursing note and vitals reviewed. Constitutional: She is oriented to person, place, and time.  Thin, Frail elderly female. She has active work of breathing however can speak in full sentences.  HENT:  Head: Normocephalic and atraumatic.  Extremely hard of hearing  Neck: Normal range of motion. Neck supple.  Cardiovascular: Normal rate and regular rhythm.   Murmur heard. No peripheral edema  Pulmonary/Chest: She has wheezes.  Patient with increased work of breathing, and belly breathing, mild supraclavicular retractions, auditory wheezing. On auscultation she has diffuse high-pitched expiratory wheeze, no crackles, mild rhonchi.  Abdominal: Soft. She exhibits no distension and no mass. There is tenderness ( diffuse abdominal tenderness). There is no rebound and no guarding.  Musculoskeletal: Normal range of motion. She exhibits no edema and no tenderness.  Neurological: She is alert and oriented to person, place, and time.    ED Course  Procedures (including critical care time) Labs Review Labs Reviewed - No data to display  Imaging Review No results found.   EKG Interpretation   Date/Time:  Wednesday March 14 2014 11:41:06 EDT Ventricular Rate:  87 PR Interval:  132 QRS Duration: 84 QT Interval:  424 QTC Calculation: 510 R Axis:   33 Text Interpretation:  Normal sinus rhythm Abnormal QRS-T angle, consider  primary T wave abnormality Abnormal ECG No significant change since last  tracing Confirmed by POLLINA  MD, [place] ([id number]) on 03/14/2014  11:57:19 AM      MDM   Final diagnoses:  None    Patient here with abdominal pain and shortness of breath. Workup initiated. I will order a albuterol and ipratropium  treatment.   Patient breathing is somewhat improved with less  wheezing and respiratory effort. labs without acute abnormality. Her pro bnp is slightly elevated from previous but i do not feel the chf fits her clinical picture. Ct negative for acute abnormality.       4:36 PM BP 114/43  Pulse 87  Temp(Src) 99 F (37.2 C) (Oral)  Resp 20  SpO2 96% Patient with improved breathing after duoneb, however she did desaturate into the 80s with ambulation. WIll call for admission for COPD exacerbation.l  Margarita Mail, PA-C 03/14/14 1955

## 2014-03-14 NOTE — Telephone Encounter (Signed)
Called spoke with pt. appt scheduled for 7/16 at 11:30. Pt aware to arrive 15 min early. Aware of location as well.

## 2014-03-14 NOTE — ED Notes (Signed)
I ambulated patient in the hallway stats drop to 89.  Made PA aware.

## 2014-03-14 NOTE — ED Notes (Signed)
Spoke to pt about obtaining a urine sample.  Pt sts she would let me know when she has to go.

## 2014-03-15 ENCOUNTER — Other Ambulatory Visit: Payer: Self-pay | Admitting: Internal Medicine

## 2014-03-15 DIAGNOSIS — J449 Chronic obstructive pulmonary disease, unspecified: Secondary | ICD-10-CM

## 2014-03-15 LAB — BASIC METABOLIC PANEL
BUN: 12 mg/dL (ref 6–23)
CALCIUM: 8.6 mg/dL (ref 8.4–10.5)
CHLORIDE: 102 meq/L (ref 96–112)
CO2: 26 meq/L (ref 19–32)
Creatinine, Ser: 0.74 mg/dL (ref 0.50–1.10)
GFR calc Af Amer: 89 mL/min — ABNORMAL LOW (ref >90)
GFR calc non Af Amer: 77 mL/min — ABNORMAL LOW (ref >90)
GLUCOSE: 198 mg/dL — AB (ref 70–99)
Potassium: 3.8 mEq/L (ref 3.7–5.3)
Sodium: 140 mEq/L (ref 137–147)

## 2014-03-15 LAB — CBC
HEMATOCRIT: 31.3 % — AB (ref 36.0–46.0)
Hemoglobin: 10.1 g/dL — ABNORMAL LOW (ref 12.0–15.0)
MCH: 26.3 pg (ref 26.0–34.0)
MCHC: 32.3 g/dL (ref 30.0–36.0)
MCV: 81.5 fL (ref 78.0–100.0)
PLATELETS: 113 10*3/uL — AB (ref 150–400)
RBC: 3.84 MIL/uL — AB (ref 3.87–5.11)
RDW: 14.9 % (ref 11.5–15.5)
WBC: 6 10*3/uL (ref 4.0–10.5)

## 2014-03-15 MED ORDER — PREDNISONE 20 MG PO TABS: 40.0000 mg | ORAL_TABLET | Freq: Every day | ORAL | Status: DC

## 2014-03-15 MED ORDER — ALBUTEROL SULFATE HFA 108 (90 BASE) MCG/ACT IN AERS: 2.0000 | INHALATION_SPRAY | Freq: Four times a day (QID) | RESPIRATORY_TRACT | Status: DC | PRN

## 2014-03-15 NOTE — Progress Notes (Signed)
Inpatient Diabetes Program Recommendations  AACE/ADA: New Consensus Statement on Inpatient Glycemic Control (2013)  Target Ranges:  Prepandial:   less than 140 mg/dL      Peak postprandial:   less than 180 mg/dL (1-2 hours)      Critically ill patients:  140 - 180 mg/dL   Reason for Visit: Hyperglycemia  Diabetes history: DM2 Outpatient Diabetes medications: None Current orders for Inpatient glycemic control: None  Results for CHASE, KNEBEL (MRN [id number]) as of 03/15/2014 14:32  Ref. Range 02/13/2014 04:59  Hemoglobin A1C Latest Range: <5.7 % 6.4 (H)  Results for SHELBEY, SPINDLER (MRN [id number]) as of 03/15/2014 14:32  Ref. Range 03/14/2014 12:14 03/15/2014 04:43  Glucose Latest Range: 70-99 mg/dL 142 (H) 198 (H)   On Prednisone 40 QAM.  Consider addition of Novolog sensitive tidwc.  Thank you. Lorenda Peck, RD, LDN, CDE Inpatient Diabetes Coordinator [phone]

## 2014-03-15 NOTE — Telephone Encounter (Signed)
Received fax from pharmacy regarding this. Not sure why it was sent to Dr. Alain Marion.  levalbuterol  [facility]

## 2014-03-15 NOTE — Progress Notes (Signed)
Discharge instructions given to pt and her daughter, verbalized understanding. Left the unit in stable condition.

## 2014-03-15 NOTE — Telephone Encounter (Signed)
Left a message for patient to return call.

## 2014-03-15 NOTE — Telephone Encounter (Signed)
So I agree I cannot see this in her med list, check with her and see where she got this. If she tolerates Albuterol well she does not need this. If not I will consider prescribing

## 2014-03-15 NOTE — Telephone Encounter (Signed)
Please advise refill? i don't see this on patients list?

## 2014-03-15 NOTE — ED Provider Notes (Signed)
Medical screening examination/treatment/procedure(s) were conducted as a shared visit with non-physician practitioner(s) and myself.  I personally evaluated the patient during the encounter.  Please see separate associated note for evaluation and plan.    EKG Interpretation   Date/Time:  Wednesday March 14 2014 11:41:06 EDT Ventricular Rate:  87 PR Interval:  132 QRS Duration: 84 QT Interval:  424 QTC Calculation: 510 R Axis:   33 Text Interpretation:  Normal sinus rhythm Abnormal QRS-T angle, consider  primary T wave abnormality Abnormal ECG No significant change since last  tracing Confirmed by Sheridyn Canino  MD, Shakayla Hickox [id number]) on 03/14/2014  11:57:19 AM       Orpah Greek, MD 03/15/14 [phone]

## 2014-03-15 NOTE — Discharge Summary (Signed)
Physician Discharge Summary  Monique Fleming NAT:[id number] DOB: Aug 19, 1929 DOA: 03/14/2014  PCP: Penni Homans, MD  Admit date: 03/14/2014 Discharge date: 03/15/2014  Time spent: > 35 minutes  Recommendations for Outpatient Follow-up:  1. Will add short course of prednisone 2. Reassess in 1-2 weeks and decide whether or not to adjust her medications related to the management of her chronic COPD  Discharge Diagnoses:  Principal Problem:   COPD exacerbation Active Problems:   DIABETES MELLITUS, TYPE II   COPD (chronic obstructive pulmonary disease)   Acute respiratory failure with hypoxia   Tobacco dependence   Discharge Condition: stable  Diet recommendation: low sodium heart healthy  Filed Weights   03/14/14 1805  Weight: 59 kg (130 lb 1.1 oz)    History of present illness:  80 year old woman with history of ongoing cigarette smoking and emphysema presented to her primary gastroenterologist office for evaluation of reflux where she was found to be short of breath and subsequently sent to the emergency department. Initial evaluation suggested mild hypoxia and COPD exacerbation.   Hospital Course:  Acute COPD exacerbation - Patient is off of supplemental oxygen. Requesting to go home. She reports being able to ambulate without difficulty. - Will discharge a short course of oral prednisone - Patient afebrile with normal white blood cell count as such we'll not discharge on antibiotics. - Recommend smoking cessation -Patient to followup with primary care physician in 1-2 weeks  Procedures:   None  Consultations:  none  Discharge Exam: Filed Vitals:   03/15/14 1412  BP: 136/56  Pulse: 95  Temp: 98.1 F (36.7 C)  Resp: 18    General: Pt in NAD, alert and awake Cardiovascular: RRR, no MRG Respiratory: CTA BL, no wheezes  Discharge Instructions You were cared for by a hospitalist during your hospital stay. If you have any questions about your discharge  medications or the care you received while you were in the hospital after you are discharged, you can call the unit and asked to speak with the hospitalist on call if the hospitalist that took care of you is not available. Once you are discharged, your primary care physician will handle any further medical issues. Please note that NO REFILLS for any discharge medications will be authorized once you are discharged, as it is imperative that you return to your primary care physician (or establish a relationship with a primary care physician if you do not have one) for your aftercare needs so that they can reassess your need for medications and monitor your lab values.  Discharge Instructions   Call MD for:  difficulty breathing, headache or visual disturbances    Complete by:  As directed      Call MD for:  redness, tenderness, or signs of infection (pain, swelling, redness, odor or green/yellow discharge around incision site)    Complete by:  As directed      Call MD for:  temperature >100.4    Complete by:  As directed      Diet - low sodium heart healthy    Complete by:  As directed      Increase activity slowly    Complete by:  As directed             Medication List         albuterol 108 (90 BASE) MCG/ACT inhaler  Commonly known as:  PROVENTIL HFA;VENTOLIN HFA  Inhale 2 puffs into the lungs every 6 (six) hours as needed for wheezing or shortness  of breath.     cholestyramine 4 G packet  Commonly known as:  QUESTRAN  Take 4 g by mouth 2 (two) times daily as needed (diarrhea).     clonazePAM 1 MG tablet  Commonly known as:  KLONOPIN  Take 1 mg by mouth at bedtime.     clopidogrel 75 MG tablet  Commonly known as:  PLAVIX  Take 1 tablet (75 mg total) by mouth daily.     cyanocobalamin 500 MCG tablet  Take 500 mcg by mouth daily.     ferrous sulfate 325 (65 FE) MG tablet  Take 1 tablet (325 mg total) by mouth 2 (two) times daily with a meal.     Fluticasone-Salmeterol 100-50  MCG/DOSE Aepb  Commonly known as:  ADVAIR DISKUS  Inhale 1 puff into the lungs 2 (two) times daily.     metroNIDAZOLE 1 % gel  Commonly known as:  METROGEL  Apply topically daily.     multivitamin with minerals Tabs tablet  Take 1 tablet by mouth daily.     ondansetron 4 MG tablet  Commonly known as:  ZOFRAN  Take 1 tablet (4 mg total) by mouth every 6 (six) hours as needed for nausea or vomiting.     pantoprazole 40 MG tablet  Commonly known as:  PROTONIX  Take 1 tablet (40 mg total) by mouth daily.     phenytoin 100 MG ER capsule  Commonly known as:  DILANTIN  Take 2 capsules (200 mg total) by mouth at bedtime.     potassium chloride 10 MEQ tablet  Commonly known as:  KLOR-CON 10  Take 1 tablet (10 mEq total) by mouth 2 (two) times daily.     predniSONE 20 MG tablet  Commonly known as:  DELTASONE  Take 2 tablets (40 mg total) by mouth daily.     SALINE NA  Place 2 drops into the nose at bedtime as needed. For congestion       Allergies  Allergen Reactions  . Bactrim [Sulfamethoxazole-Trimethoprim] Shortness Of Breath  . Levofloxacin Other (See Comments)    seizure  . Pneumovax [Pneumococcal Polysaccharides] Other (See Comments)    Severe allergy-cellulitis  . Amoxicillin-Pot Clavulanate Other (See Comments)    Patient stated Drs. recommend taking Augmentin due to her PCN allergy.  . Penicillins Rash  . Haldol [Haloperidol Lactate]     Had unknown reaction many yrs ago per pt  . Morphine And Related     Confused and agitation  . Sudafed [Pseudoephedrine Hcl] Other (See Comments)    seizure      The results of significant diagnostics from this hospitalization (including imaging, microbiology, ancillary and laboratory) are listed below for reference.    Significant Diagnostic Studies: Dg Chest 2 View  03/14/2014   CLINICAL DATA:  Cough.  Shortness of breath.  EXAM: CHEST  2 VIEW  COMPARISON:  09/23/2013  FINDINGS: Heart size and pulmonary vascularity are  normal. However, there are Kerley B-lines at the left lung base consistent with mild interstitial pulmonary edema. The lungs are somewhat hyperinflated consistent with COPD. No effusions. No acute osseous abnormality.  IMPRESSION: Findings consistent with slight interstitial pulmonary edema.  COPD.   Electronically Signed   By: Rozetta Nunnery M.D.   On: 03/14/2014 13:43   Ct Abdomen Pelvis W Contrast  03/14/2014   CLINICAL DATA:  Abdominal pain  EXAM: CT ABDOMEN AND PELVIS WITH CONTRAST  TECHNIQUE: Multidetector CT imaging of the abdomen and pelvis was performed using the  standard protocol following bolus administration of intravenous contrast. Oral contrast was also administered.  CONTRAST:  67mL OMNIPAQUE IOHEXOL 300 MG/ML  SOLN  COMPARISON:  June 30, 2013  FINDINGS: There is mild bullous disease in the right base. There is underlying emphysematous change with mild bibasilar atelectasis. There are multiple foci of coronary artery calcification.  No focal liver lesions are identified. Liver is prominent, measuring 16.1 cm in length. There is fatty change in the liver. The liver has a nodular contour with a prominent caudate lobe and a relatively hypoplastic left lobe. These findings suggest a degree of underlying hepatic cirrhosis. There is no biliary duct dilatation. Gallbladder is absent. There is evidence of recanalization of the umbilical vein, a finding that was also noted previously.  Spleen is enlarged, measuring 14.2 x 13.8 x 6.5 cm. No focal splenic lesions are identified.  Pancreas and adrenals appear normal.  There are multiple renal cysts bilaterally. The largest cysts are on the left side. The largest cyst measures 6.9 x 5.7 cm. The next largest cyst, also on the left, measures 5.5 x 4.4 cm. There smaller cysts on the right. The largest cyst on the right measure 3.0 x 3.0 cm. No non cystic renal masses are identified on either side. There is no hydronephrosis or calculus in either kidney. There  is no ureteral calculus or ureterectasis on either side.  In the pelvis, the urinary bladder is midline with normal wall thickness. There is no pelvic mass or fluid collection. Appendix is not seen. There is no periappendiceal region inflammation.  There is no bowel obstruction.  No free air or portal venous air.  There is no ascites, adenopathy, or abscess in the abdomen or pelvis. There is atherosclerotic change in aorta. The localized plaque dissection in the infrarenal aorta is best seen currently on slice 30 series 2, appear stable. There is slight dilatation of the distal abdominal aorta with a maximum transverse diameter of 2.9 by 2.9 cm, stable. There is also atherosclerotic change in both iliac arteries. There is degenerative change in the lumbar spine, most marked at L5-S1. There are no blastic or lytic bone lesions.  IMPRESSION: Changes of hepatic cirrhosis are again noted. Splenomegaly is also again noted. No focal liver or splenic lesions are identified. There is fatty change throughout the liver.  There is no bowel obstruction. No abscess. There is no periappendiceal region inflammation.  There are renal cysts bilaterally, larger on the left than on the right. No renal hydronephrosis. No renal or ureteral calculi.  Extensive atherosclerotic change with localized plaque dissection in the mid aorta. This is a stable finding.   Electronically Signed   By: Lowella Grip M.D.   On: 03/14/2014 14:34   Dg Abd 2 Views  03/14/2014   CLINICAL DATA:  Abdominal pain  EXAM: ABDOMEN - 2 VIEW  COMPARISON:  CT abdomen 06/30/2013  FINDINGS: Nonobstructive bowel gas pattern. Contrast in large and small bowel from CT scan today. Surgical clips right upper quadrant from cholecystectomy. No free air.  IMPRESSION: No acute abnormality.   Electronically Signed   By: Franchot Gallo M.D.   On: 03/14/2014 13:42    Microbiology: No results found for this or any previous visit (from the past 240 hour(s)).    Labs: Basic Metabolic Panel:  Recent Labs Lab 03/14/14 1214 03/15/14 0443  NA 142 140  K 4.0 3.8  CL 104 102  CO2 26 26  GLUCOSE 142* 198*  BUN 11 12  CREATININE 0.74  0.74  CALCIUM 8.4 8.6   Liver Function Tests:  Recent Labs Lab 03/14/14 1214  AST 23  ALT 11  ALKPHOS 116  BILITOT 0.6  PROT 6.8  ALBUMIN 2.7*    Recent Labs Lab 03/14/14 1214  LIPASE 59   No results found for this basename: AMMONIA,  in the last 168 hours CBC:  Recent Labs Lab 03/14/14 1214 03/15/14 0443  WBC 8.7 6.0  NEUTROABS 5.9  --   HGB 10.5* 10.1*  HCT 33.2* 31.3*  MCV 81.8 81.5  PLT 131* 113*   Cardiac Enzymes: No results found for this basename: CKTOTAL, CKMB, CKMBINDEX, TROPONINI,  in the last 168 hours BNP: BNP (last 3 results)  Recent Labs  03/14/14 1214  PROBNP 604.7*   CBG: No results found for this basename: GLUCAP,  in the last 168 hours     Signed:  Velvet Bathe  [facility] 03/15/2014, 3:37 PM

## 2014-03-22 ENCOUNTER — Telehealth: Payer: Self-pay | Admitting: Family Medicine

## 2014-03-22 MED ORDER — ALBUTEROL SULFATE (2.5 MG/3ML) 0.083% IN NEBU: 2.5000 mg | INHALATION_SOLUTION | Freq: Four times a day (QID) | RESPIRATORY_TRACT | Status: DC | PRN

## 2014-03-22 NOTE — Telephone Encounter (Signed)
Informed patient of this.  °

## 2014-03-22 NOTE — Telephone Encounter (Signed)
Patient was discharged from the hospital last week and her nebulizer rx has expired. Could we refill nebulizer rx until patient is seen by Dr. Joya Gaskins? Patient would like a call when this has been sent. Thanks!

## 2014-03-22 NOTE — Telephone Encounter (Signed)
Please call patient & let her know Nebulizer Solution has been sent to [facility] Thanks.

## 2014-03-27 NOTE — Telephone Encounter (Signed)
PatienT was called back and seen in office

## 2014-04-03 DIAGNOSIS — F09 Unspecified mental disorder due to known physiological condition: Secondary | ICD-10-CM | POA: Insufficient documentation

## 2014-04-05 ENCOUNTER — Ambulatory Visit (INDEPENDENT_AMBULATORY_CARE_PROVIDER_SITE_OTHER): Payer: Medicare Other | Admitting: Critical Care Medicine

## 2014-04-05 ENCOUNTER — Encounter: Payer: Self-pay | Admitting: Critical Care Medicine

## 2014-04-05 VITALS — BP 114/60 | HR 86 | Temp 98.1°F | Ht 65.5 in | Wt 128.1 lb

## 2014-04-05 DIAGNOSIS — J441 Chronic obstructive pulmonary disease with (acute) exacerbation: Secondary | ICD-10-CM

## 2014-04-05 DIAGNOSIS — J449 Chronic obstructive pulmonary disease, unspecified: Secondary | ICD-10-CM

## 2014-04-05 DIAGNOSIS — I6529 Occlusion and stenosis of unspecified carotid artery: Secondary | ICD-10-CM

## 2014-04-05 DIAGNOSIS — J411 Mucopurulent chronic bronchitis: Secondary | ICD-10-CM

## 2014-04-05 MED ORDER — FLUTICASONE-SALMETEROL 250-50 MCG/DOSE IN AEPB: 1.0000 | INHALATION_SPRAY | Freq: Two times a day (BID) | RESPIRATORY_TRACT | Status: DC

## 2014-04-05 NOTE — Patient Instructions (Signed)
Increase Advair to 250 one puff twice daily No oxygen needed during the daytime Return with all of your medications for a medication reconciliation with Rexene Edison our NP Return 2 months

## 2014-04-05 NOTE — Progress Notes (Signed)
Subjective:    Patient ID: Monique Fleming, female    DOB: [birth year], 80 y.o.   MRN: [id number]  HPI 80 y.o.F Sees CDY for lung dx x 53yrs. Just in hosp 03/14/14 over night.   Now is better.  Pt was on advair, also has neb med.  On d/c given another med for prn use.  Pt is not sure on meds. Now min cough, mucus is white.  No chest pain, no edema, no oxygen at home. Never been to pulm rehab. No fever.  No qhs dyspnea .  Dyspnea with exertion only. No wheezing. Odors/pollen/dampness make dyspnea worse. No longer smoking. No change with eating.  Pt eating better, had lost weight.  Past Medical History  Diagnosis Date  . Aortic stenosis     moderate by echo 6/11 gradient 20/76mmHg for mean and peak  . TIA (transient ischemic attack)     Dr. Leonie Fleming  . Peripheral vascular disease   . IBS (irritable bowel syndrome)   . Diabetes mellitus     type II  . Chronic bronchitis   . COPD (chronic obstructive pulmonary disease)   . Atherosclerosis   . Depression   . Anxiety     Dr Monique Fleming  . Renal cyst   . Anemia     iron deficiency  . Cellulitis   . Diverticular disease   . Infectious diarrhea(009.2)   . Sinusitis   . Thrush   . Abnormal liver function test   . GERD (gastroesophageal reflux disease)   . Insomnia     chronic  . Adenocarcinoma 2008    colon,s/p right hemicolectomy  . Angina   . Seizures     hx of one seizure "  . Cirrhosis of liver     hx of  . Clostridium difficile diarrhea   . Thrombocytopenia 04/01/2013  . Bursitis     spine per pt  . Macular degeneration 02/18/2014    Follows with [facility]  . Internal hemorrhoids      Family History  Problem Relation Age of Onset  . Colon cancer Paternal Grandmother   . Esophageal cancer Brother   . Heart disease Mother   . Diabetes Brother   . Hypertension Father   . Rectal cancer Neg Hx   . Stomach cancer Neg Hx      History   Social History  . Marital Status: Widowed    Spouse Name: N/A    Number of  Children: 3  . Years of Education: N/A   Occupational History  . retired   .     Social History Main Topics  . Smoking status: Former Smoker -- 0.50 packs/day for 70 years    Types: Cigarettes    Quit date: 01/03/2014  . Smokeless tobacco: Never Used     Comment: Used Electronic cigarette  . Alcohol Use: No  . Drug Use: No  . Sexual Activity: No   Other Topics Concern  . Not on file   Social History Narrative   Patient signed a Designated Party Release to allow her daughter, Monique Fleming, to have access to her medical records/information. Entered by Fleet Contras March 24,2011 @ 2:47 pm   Dailly Caffeine Use     Allergies  Allergen Reactions  . Bactrim [Sulfamethoxazole-Trimethoprim] Shortness Of Breath  . Levofloxacin Other (See Comments)    seizure  . Pneumovax [Pneumococcal Polysaccharides] Other (See Comments)    Severe allergy-cellulitis  . Amoxicillin-Pot Clavulanate Other (See Comments)  Patient stated Drs. recommend taking Augmentin due to her PCN allergy.  . Penicillins Rash  . Bupropion   . Haldol [Haloperidol Lactate]     Had unknown reaction many yrs ago per pt  . Haloperidol   . Morphine   . Morphine And Related     Confused and agitation  . Pneumococcal Vaccine   . Pseudoephedrine Hcl   . Sudafed [Pseudoephedrine Hcl] Other (See Comments)    seizure     Outpatient Prescriptions Prior to Visit  Medication Sig Dispense Refill  . albuterol (PROVENTIL HFA;VENTOLIN HFA) 108 (90 BASE) MCG/ACT inhaler Inhale 2 puffs into the lungs every 6 (six) hours as needed for wheezing or shortness of breath.  1 Inhaler  2  . albuterol (PROVENTIL) (2.5 MG/3ML) 0.083% nebulizer solution Take 3 mLs (2.5 mg total) by nebulization every 6 (six) hours as needed for wheezing or shortness of breath.  75 mL  0  . cholestyramine (QUESTRAN) 4 G packet Take 4 g by mouth 2 (two) times daily as needed (diarrhea).      . clonazePAM (KLONOPIN) 1 MG tablet Take 1 mg by mouth at  bedtime.      . clopidogrel (PLAVIX) 75 MG tablet Take 1 tablet (75 mg total) by mouth daily.  90 tablet  1  . cyanocobalamin 500 MCG tablet Take 500 mcg by mouth daily.      . ferrous sulfate 325 (65 FE) MG tablet Take 1 tablet (325 mg total) by mouth 2 (two) times daily with a meal.  60 tablet  2  . Multiple Vitamin (MULTIVITAMIN WITH MINERALS) TABS tablet Take 1 tablet by mouth daily.      . ondansetron (ZOFRAN) 4 MG tablet Take 1 tablet (4 mg total) by mouth every 6 (six) hours as needed for nausea or vomiting.  30 tablet  1  . pantoprazole (PROTONIX) 40 MG tablet Take 1 tablet (40 mg total) by mouth daily.  30 tablet  3  . phenytoin (DILANTIN) 100 MG ER capsule Take 2 capsules (200 mg total) by mouth at bedtime.  60 capsule  3  . SALINE NA Place 2 drops into the nose at bedtime as needed. For congestion      . Fluticasone-Salmeterol (ADVAIR DISKUS) 100-50 MCG/DOSE AEPB Inhale 1 puff into the lungs 2 (two) times daily.  180 each  3  . metroNIDAZOLE (METROGEL) 1 % gel Apply topically daily.  45 g  1  . potassium chloride (KLOR-CON 10) 10 MEQ tablet Take 1 tablet (10 mEq total) by mouth 2 (two) times daily.  90 tablet  1  . predniSONE (DELTASONE) 20 MG tablet Take 2 tablets (40 mg total) by mouth daily.  10 tablet  0   No facility-administered medications prior to visit.      Review of Systems  Constitutional: Positive for appetite change. Negative for unexpected weight change.  HENT: Positive for congestion. Negative for dental problem, nosebleeds, postnasal drip, sinus pressure, sneezing, trouble swallowing and voice change.   Eyes: Negative for redness and itching.  Respiratory: Positive for cough. Negative for chest tightness.   Cardiovascular: Negative for palpitations.  Gastrointestinal: Negative for nausea.       Pos for GERD  Genitourinary: Negative for dysuria.  Musculoskeletal: Negative for joint swelling.  Hematological: Does not bruise/bleed easily.    Psychiatric/Behavioral: Positive for dysphoric mood. The patient is nervous/anxious.        Objective:   Physical Exam Filed Vitals:   04/05/14 0942  BP: 114/60  Pulse: 86  Temp: 98.1 F (36.7 C)  TempSrc: Oral  Height: 5' 5.5" (1.664 m)  Weight: 128 lb 1.9 oz (58.115 kg)  SpO2: 98%    Gen: Pleasant, well-nourished, in no distress,  normal affect  ENT: No lesions,  mouth clear,  oropharynx clear, no postnasal drip  Neck: No JVD, no TMG, no carotid bruits  Lungs: No use of accessory muscles, no dullness to percussion, distant BS  Cardiovascular: RRR, heart sounds normal, no murmur or gallops, no peripheral edema  Abdomen: soft and NT, no HSM,  BS normal  Musculoskeletal: No deformities, no cyanosis or clubbing  Neuro: alert, non focal  Skin: Warm, no lesions or rashes  No results found.        Assessment & Plan:   COPD exacerbation Recent copd exacerbation now resolved Very confused about all of her medications Plan ncrease Advair to 250 one puff twice daily No oxygen needed during the daytime Return with all of your medications for a medication reconciliation with Rexene Edison our NP Return 2 months     Updated Medication List Outpatient Encounter Prescriptions as of 04/05/2014  Medication Sig  . albuterol (PROVENTIL HFA;VENTOLIN HFA) 108 (90 BASE) MCG/ACT inhaler Inhale 2 puffs into the lungs every 6 (six) hours as needed for wheezing or shortness of breath.  Marland Kitchen albuterol (PROVENTIL) (2.5 MG/3ML) 0.083% nebulizer solution Take 3 mLs (2.5 mg total) by nebulization every 6 (six) hours as needed for wheezing or shortness of breath.  . cholestyramine (QUESTRAN) 4 G packet Take 4 g by mouth 2 (two) times daily as needed (diarrhea).  . clonazePAM (KLONOPIN) 1 MG tablet Take 1 mg by mouth at bedtime.  . clopidogrel (PLAVIX) 75 MG tablet Take 1 tablet (75 mg total) by mouth daily.  . cyanocobalamin 500 MCG tablet Take 500 mcg by mouth daily.  Marland Kitchen desvenlafaxine  (PRISTIQ) 100 MG 24 hr tablet Take 200 mg by mouth daily.  . ferrous sulfate 325 (65 FE) MG tablet Take 1 tablet (325 mg total) by mouth 2 (two) times daily with a meal.  . metroNIDAZOLE (METROGEL) 1 % gel Apply topically as needed.  . Multiple Vitamin (MULTIVITAMIN WITH MINERALS) TABS tablet Take 1 tablet by mouth daily.  . Multiple Vitamins-Minerals (OCUVITE PRESERVISION) TABS Take by mouth 2 (two) times daily.  . ondansetron (ZOFRAN) 4 MG tablet Take 1 tablet (4 mg total) by mouth every 6 (six) hours as needed for nausea or vomiting.  . pantoprazole (PROTONIX) 40 MG tablet Take 1 tablet (40 mg total) by mouth daily.  . phenytoin (DILANTIN) 100 MG ER capsule Take 2 capsules (200 mg total) by mouth at bedtime.  Marland Kitchen QUEtiapine (SEROQUEL) 100 MG tablet Take 1-2 at bedtime  . SALINE NA Place 2 drops into the nose at bedtime as needed. For congestion  . [DISCONTINUED] Fluticasone-Salmeterol (ADVAIR DISKUS) 100-50 MCG/DOSE AEPB Inhale 1 puff into the lungs 2 (two) times daily.  . [DISCONTINUED] metroNIDAZOLE (METROGEL) 1 % gel Apply topically daily.  . Fluticasone-Salmeterol (ADVAIR) 250-50 MCG/DOSE AEPB Inhale 1 puff into the lungs 2 (two) times daily.  . [DISCONTINUED] potassium chloride (KLOR-CON 10) 10 MEQ tablet Take 1 tablet (10 mEq total) by mouth 2 (two) times daily.  . [DISCONTINUED] predniSONE (DELTASONE) 20 MG tablet Take 2 tablets (40 mg total) by mouth daily.

## 2014-04-05 NOTE — Assessment & Plan Note (Signed)
Recent copd exacerbation now resolved Very confused about all of her medications Plan ncrease Advair to 250 one puff twice daily No oxygen needed during the daytime Return with all of your medications for a medication reconciliation with Rexene Edison our NP Return 2 months

## 2014-04-15 ENCOUNTER — Telehealth: Payer: Self-pay | Admitting: Internal Medicine

## 2014-04-15 MED ORDER — PREDNISONE 10 MG PO TABS: ORAL_TABLET | ORAL | Status: DC

## 2014-04-15 NOTE — Telephone Encounter (Signed)
Daughter reports mother with increased cough/chest congestion on advair with no better on hfa, neb helps but becoming dep on it  rec Prednisone 10 mg take  4 each am x 2 days,   2 each am x 2 days,  1 each am x 2 days and stop   F/u in am in not better, to er if worse

## 2014-04-15 NOTE — Telephone Encounter (Signed)
Needs OV with TP this week with all meds in a bag.  She does not have any idea what meds she is taking

## 2014-04-16 ENCOUNTER — Telehealth: Payer: Self-pay | Admitting: Critical Care Medicine

## 2014-04-16 NOTE — Telephone Encounter (Signed)
rec Prednisone 10 mg take 4 each am x 2 days, 2 each am x 2 days, 1 each am x 2 days and stop --  Called spoke with pt. Made her aware of the above. She voiced her understanding and needed nothing further needed

## 2014-04-16 NOTE — Telephone Encounter (Signed)
Pt is already scheduled for 04/17/14 w/ TP. I called pt to remind her of the appt and to make sure she brings ALL of her medications with her. She voiced understanding.

## 2014-04-17 ENCOUNTER — Ambulatory Visit (INDEPENDENT_AMBULATORY_CARE_PROVIDER_SITE_OTHER): Payer: Medicare Other | Admitting: Adult Health

## 2014-04-17 ENCOUNTER — Encounter: Payer: Self-pay | Admitting: Adult Health

## 2014-04-17 VITALS — BP 130/80 | HR 97 | Temp 98.4°F | Ht 65.5 in | Wt 128.2 lb

## 2014-04-17 DIAGNOSIS — J449 Chronic obstructive pulmonary disease, unspecified: Secondary | ICD-10-CM

## 2014-04-17 NOTE — Assessment & Plan Note (Addendum)
Recent flare now resolving  Patient's medications were reviewed today and patient education was given. Computerized medication calendar was adjusted/completed   Plan  Finish Prednisone as planned  Continue on Advair 1 puff Twice daily  , rinse after use.  Avoid exposure to cleaning chemicals.  Work on not smoking  Follow med calendar closely and bring to each visit.  Follow up Dr. Joya Gaskins  In 6 weeks in [facility] [place]  Please contact office for sooner follow up if symptoms do not improve or worsen or seek emergency care

## 2014-04-17 NOTE — Progress Notes (Signed)
   Subjective:    Patient ID: Monique Fleming, female    DOB: 1929/05/11, 80 y.o.   MRN: [id number]  HPI 44 yoF  smoker with COPD complicated by DM, ASCVD, hx colon cancer, anemia., GERD, esophageal varices  04/17/2014 Follow up and Med Review  Had COPD flare last week, called in after cough /wheezing developed with cleaning house. Feels cleaners caused wheezing/cough. Called in a steroid taper.  Was admitted 03/15/14 for COPD exacerbation  tx w/ IV abx, steroids /and nebs.  Advised on smoking cessation.  On Advair Twice daily   She is feeling better w/ less wheezing /shortness of breath.  We reviewed all her meds and organized them into med calendar with pt education   Review of Systems Constitutional:   No  weight loss, night sweats,  Fevers, chills,  +fatigue, or  lassitude.  HEENT:   No headaches,  Difficulty swallowing,  Tooth/dental problems, or  Sore throat,       No sneezing, itching, ear ache, nasal congestion, post nasal drip,   CV:  No chest pain,  Orthopnea, PND, swelling in lower extremities, anasarca, dizziness, palpitations, syncope.   GI  No heartburn, indigestion, abdominal pain, nausea, vomiting, diarrhea, change in bowel habits, loss of appetite, bloody stools.   Resp:    No chest wall deformity  Skin: no rash or lesions.  GU: no dysuria, change in color of urine, no urgency or frequency.  No flank pain, no hematuria   MS:  No joint pain or swelling.  No decreased range of motion.  No back pain.  Psych:  No change in mood or affect. No depression or anxiety.  No memory loss.         Objective:   Physical Exam GEN: A/Ox3; pleasant , NAD, elderly   HEENT:  [place]/AT,  EACs-clear, TMs-wnl, NOSE-clear, THROAT-clear, no lesions, no postnasal drip or exudate noted.   NECK:  Supple w/ fair ROM; no JVD; normal carotid impulses w/o bruits; no thyromegaly or nodules palpated; no lymphadenopathy.  RESP decreased BS in bases  no accessory muscle use, no dullness to  percussion  CARD:  RRR, no m/r/g  , no peripheral edema, pulses intact, no cyanosis or clubbing.  GI:   Soft & nt; nml bowel sounds; no organomegaly or masses detected.  Musco: Warm bil, no deformities or joint swelling noted.   Neuro: alert, no focal deficits noted.    Skin: Warm, no lesions or rashes         Assessment & Plan:

## 2014-04-17 NOTE — Patient Instructions (Signed)
Finish Prednisone as planned  Continue on Advair 1 puff Twice daily  , rinse after use.  Avoid exposure to cleaning chemicals.  Work on not smoking  Follow med calendar closely and bring to each visit.  Follow up Dr. Joya Gaskins  In 6 weeks in [facility] [place]  Please contact office for sooner follow up if symptoms do not improve or worsen or seek emergency care

## 2014-04-19 ENCOUNTER — Institutional Professional Consult (permissible substitution): Payer: Medicare Other | Admitting: Critical Care Medicine

## 2014-04-20 NOTE — Addendum Note (Signed)
Addended by: Parke Poisson E on: 04/20/2014 12:30 PM   Modules accepted: Orders

## 2014-05-03 ENCOUNTER — Telehealth: Payer: Self-pay | Admitting: Critical Care Medicine

## 2014-05-03 NOTE — Telephone Encounter (Signed)
Spoke with the pt  She c/o chest tightness x 2 days  Had low grade temp last night  I offered ov for today and she could not make it here in time  OV with PW for tomorrow am  Pt informed to seek emergent care in the interim if needed

## 2014-05-04 ENCOUNTER — Encounter: Payer: Self-pay | Admitting: Critical Care Medicine

## 2014-05-04 ENCOUNTER — Ambulatory Visit (INDEPENDENT_AMBULATORY_CARE_PROVIDER_SITE_OTHER): Payer: Medicare Other | Admitting: Critical Care Medicine

## 2014-05-04 VITALS — BP 140/60 | HR 90 | Temp 100.2°F | Ht 65.5 in | Wt 133.4 lb

## 2014-05-04 DIAGNOSIS — J449 Chronic obstructive pulmonary disease, unspecified: Secondary | ICD-10-CM

## 2014-05-04 DIAGNOSIS — I6529 Occlusion and stenosis of unspecified carotid artery: Secondary | ICD-10-CM

## 2014-05-04 MED ORDER — FLUTICASONE PROPIONATE 50 MCG/ACT NA SUSP: 2.0000 | Freq: Every day | NASAL | Status: DC

## 2014-05-04 MED ORDER — METHYLPREDNISOLONE ACETATE 80 MG/ML IJ SUSP
120.0000 mg | Freq: Once | INTRAMUSCULAR | Status: AC
  Administered 2014-05-04: 120 mg via INTRAMUSCULAR

## 2014-05-04 MED ORDER — AZITHROMYCIN 250 MG PO TABS: ORAL_TABLET | ORAL | Status: DC

## 2014-05-04 NOTE — Patient Instructions (Signed)
Use fluticasone nasal spray two spray ea nostril daily Azithromycin 250mg  Take two once then one daily until gone Take probiotic one three times a day with meals while on antibiotic, available over the counter Stay on advair A depomedrol 120mg  injection was given Return [employer] office

## 2014-05-04 NOTE — Assessment & Plan Note (Signed)
Chronic obstructive lung disease with chronic bronchitic component now with acute exacerbation precipitated by recent exposure to Clorox cleaning agent Note antibiotic use the patient has a prior history of Clostridium difficile flare and for this we'll need to be mindful of same Plan Use fluticasone nasal spray two spray ea nostril daily Azithromycin 250mg  Take two once then one daily until gone Take probiotic one three times a day with meals while on antibiotic, available over the counter Stay on advair A depomedrol 120mg  injection was given Return [employer] office

## 2014-05-04 NOTE — Progress Notes (Signed)
Subjective:    Patient ID: Monique Fleming, female    DOB: 1928-12-17, 80 y.o.   MRN: [id number]  HPI  80 y.o.F Sees CDY for lung dx x 71yrs. Just in hosp 03/14/14 over night.   Now is better.  Pt was on advair, also has neb med.  On d/c given another med for prn use.  Pt is not sure on meds. Now min cough, mucus is white.  No chest pain, no edema, no oxygen at home. Never been to pulm rehab. No fever.  No qhs dyspnea .  Dyspnea with exertion only. No wheezing. Odors/pollen/dampness make dyspnea worse. No longer smoking. No change with eating.  Pt eating better, had lost weight.  05/04/2014 Chief Complaint  Patient presents with  . Acute Visit    increased SOB, chest tightness, temp 100, and wheezing - worsened over the past couple of wks.  Recent copd exacerbation now resolved  Very confused about all of her medications  Plan  ncrease Advair to 250 one puff twice daily  No oxygen needed during the daytime  Return with all of your medications for a medication reconciliation with Tammy Parrett our NPPt Ppt   Patient saw NP and has med rec now Pt was cleaning in home and exposed to pinesol and chlorox two weeks ago and never any better. Pt feels tight as well.  No real cough.  T100.  No yellow or green mucus. Notes more sinus drainage and pressure.   No hemoptysis     Review of Systems  Constitutional: Positive for appetite change. Negative for unexpected weight change.  HENT: Positive for congestion. Negative for dental problem, nosebleeds, postnasal drip, sinus pressure, sneezing, trouble swallowing and voice change.   Eyes: Negative for redness and itching.  Respiratory: Positive for cough. Negative for chest tightness.   Cardiovascular: Negative for palpitations.  Gastrointestinal: Negative for nausea.       Pos for GERD  Genitourinary: Negative for dysuria.  Musculoskeletal: Negative for joint swelling.  Hematological: Does not bruise/bleed easily.    Psychiatric/Behavioral: Positive for dysphoric mood. The patient is nervous/anxious.        Objective:   Physical Exam  Filed Vitals:   05/04/14 1147  BP: 140/60  Pulse: 90  Temp: 100.2 F (37.9 C)  TempSrc: Oral  Height: 5' 5.5" (1.664 m)  Weight: 133 lb 6.4 oz (60.51 kg)  SpO2: 95%    Gen: Pleasant, well-nourished, in no distress,  normal affect  ENT: No lesions,  mouth clear,  oropharynx clear, no postnasal drip  Neck: No JVD, no TMG, no carotid bruits  Lungs: No use of accessory muscles, no dullness to percussion, expired wheezes with poor airflow  Cardiovascular: RRR, heart sounds normal, no murmur or gallops, no peripheral edema  Abdomen: soft and NT, no HSM,  BS normal  Musculoskeletal: No deformities, no cyanosis or clubbing  Neuro: alert, non focal  Skin: Warm, no lesions or rashes  No results found.     Assessment & Plan:   COPD (chronic obstructive pulmonary disease) Gold B  Chronic obstructive lung disease with chronic bronchitic component now with acute exacerbation precipitated by recent exposure to Clorox cleaning agent Note antibiotic use the patient has a prior history of Clostridium difficile flare and for this we'll need to be mindful of same Plan Use fluticasone nasal spray two spray ea nostril daily Azithromycin 250mg  Take two once then one daily until gone Take probiotic one three times a day with meals while  on antibiotic, available over the counter Stay on advair A depomedrol 120mg  injection was given Return [place] office      Updated Medication List Outpatient Encounter Prescriptions as of 05/04/2014  Medication Sig  . albuterol (PROAIR HFA) 108 (90 BASE) MCG/ACT inhaler Inhale 2 puffs into the lungs every 4 (four) hours as needed for wheezing or shortness of breath.  Marland Kitchen albuterol (PROVENTIL) (2.5 MG/3ML) 0.083% nebulizer solution Take 2.5 mg by nebulization every 4 (four) hours as needed for wheezing or shortness of breath.  .  cholestyramine (QUESTRAN) 4 G packet Take 4 g by mouth daily as needed (diarrhea).   . clonazePAM (KLONOPIN) 1 MG tablet Take 1 mg by mouth 2 (two) times daily as needed.   . clopidogrel (PLAVIX) 75 MG tablet Take 1 tablet (75 mg total) by mouth daily.  . cyanocobalamin 500 MCG tablet Take 500 mcg by mouth daily.  Marland Kitchen desvenlafaxine (PRISTIQ) 100 MG 24 hr tablet Take 100 mg by mouth daily.   . ferrous sulfate 325 (65 FE) MG tablet Take 1 tablet (325 mg total) by mouth 2 (two) times daily with a meal.  . Fluticasone-Salmeterol (ADVAIR) 250-50 MCG/DOSE AEPB Inhale 1 puff into the lungs 2 (two) times daily.  . Multiple Vitamin (MULTIVITAMIN WITH MINERALS) TABS tablet Take 1 tablet by mouth daily.  . Multiple Vitamins-Minerals (OCUVITE PRESERVISION) TABS Take by mouth 2 (two) times daily.  . ondansetron (ZOFRAN) 4 MG tablet Take 1 tablet (4 mg total) by mouth every 6 (six) hours as needed for nausea or vomiting.  . pantoprazole (PROTONIX) 40 MG tablet Take 1 tablet (40 mg total) by mouth daily.  . phenytoin (DILANTIN) 100 MG ER capsule Take 2 capsules (200 mg total) by mouth at bedtime.  Vladimir Faster Glycol-Propyl Glycol (SYSTANE) 0.4-0.3 % SOLN Place 1 drop into both eyes 2 (two) times daily as needed.  . potassium chloride (K-DUR) 10 MEQ tablet Take 10 mEq by mouth 2 (two) times daily.  Marland Kitchen saccharomyces boulardii (FLORASTOR) 250 MG capsule Take 250 mg by mouth daily.  . traMADol (ULTRAM) 50 MG tablet Take 50 mg by mouth 2 (two) times daily as needed.  Marland Kitchen azithromycin (ZITHROMAX) 250 MG tablet Take two once then one daily until gone  . fluticasone (FLONASE) 50 MCG/ACT nasal spray Place 2 sprays into both nostrils daily.  . [DISCONTINUED] predniSONE (DELTASONE) 10 MG tablet Take  4 each am x 2 days,   2 each am x 2 days,  1 each am x 2 days and stop  . [EXPIRED] methylPREDNISolone acetate (DEPO-MEDROL) injection 120 mg

## 2014-05-15 ENCOUNTER — Ambulatory Visit: Payer: Medicare Other | Admitting: Family Medicine

## 2014-05-16 ENCOUNTER — Encounter: Payer: Self-pay | Admitting: Physician Assistant

## 2014-05-16 ENCOUNTER — Ambulatory Visit (INDEPENDENT_AMBULATORY_CARE_PROVIDER_SITE_OTHER): Payer: Medicare Other | Admitting: Physician Assistant

## 2014-05-16 VITALS — BP 146/78 | HR 90 | Temp 98.2°F | Resp 18 | Ht 65.5 in | Wt 129.2 lb

## 2014-05-16 DIAGNOSIS — R3989 Other symptoms and signs involving the genitourinary system: Secondary | ICD-10-CM

## 2014-05-16 DIAGNOSIS — I6529 Occlusion and stenosis of unspecified carotid artery: Secondary | ICD-10-CM

## 2014-05-16 DIAGNOSIS — R509 Fever, unspecified: Secondary | ICD-10-CM

## 2014-05-16 DIAGNOSIS — D649 Anemia, unspecified: Secondary | ICD-10-CM

## 2014-05-16 DIAGNOSIS — R399 Unspecified symptoms and signs involving the genitourinary system: Secondary | ICD-10-CM

## 2014-05-16 DIAGNOSIS — Z789 Other specified health status: Secondary | ICD-10-CM

## 2014-05-16 LAB — CBC WITH DIFFERENTIAL/PLATELET
BASOS ABS: 0.1 10*3/uL (ref 0.0–0.1)
Basophils Relative: 1 % (ref 0–1)
EOS PCT: 3 % (ref 0–5)
Eosinophils Absolute: 0.3 10*3/uL (ref 0.0–0.7)
HEMATOCRIT: 36.5 % (ref 36.0–46.0)
Hemoglobin: 12 g/dL (ref 12.0–15.0)
LYMPHS ABS: 1.5 10*3/uL (ref 0.7–4.0)
Lymphocytes Relative: 17 % (ref 12–46)
MCH: 26.4 pg (ref 26.0–34.0)
MCHC: 32.9 g/dL (ref 30.0–36.0)
MCV: 80.4 fL (ref 78.0–100.0)
MONO ABS: 0.7 10*3/uL (ref 0.1–1.0)
Monocytes Relative: 8 % (ref 3–12)
Neutro Abs: 6.3 10*3/uL (ref 1.7–7.7)
Neutrophils Relative %: 71 % (ref 43–77)
Platelets: 164 10*3/uL (ref 150–400)
RBC: 4.54 MIL/uL (ref 3.87–5.11)
RDW: 18.3 % — AB (ref 11.5–15.5)
WBC: 8.9 10*3/uL (ref 4.0–10.5)

## 2014-05-16 LAB — POCT URINALYSIS DIPSTICK
Bilirubin, UA: NEGATIVE
Glucose, UA: NEGATIVE
KETONES UA: NEGATIVE
Leukocytes, UA: NEGATIVE
Nitrite, UA: NEGATIVE
SPEC GRAV UA: 1.02
Urobilinogen, UA: 0.2
pH, UA: 7

## 2014-05-16 LAB — COMPLETE METABOLIC PANEL WITH GFR
ALT: 12 U/L (ref 0–35)
AST: 27 U/L (ref 0–37)
Albumin: 3.7 g/dL (ref 3.5–5.2)
Alkaline Phosphatase: 117 U/L (ref 39–117)
BUN: 11 mg/dL (ref 6–23)
CO2: 26 mEq/L (ref 19–32)
CREATININE: 0.9 mg/dL (ref 0.50–1.10)
Calcium: 8.9 mg/dL (ref 8.4–10.5)
Chloride: 103 mEq/L (ref 96–112)
GFR, EST AFRICAN AMERICAN: 69 mL/min
GFR, EST NON AFRICAN AMERICAN: 60 mL/min
GLUCOSE: 219 mg/dL — AB (ref 70–99)
Potassium: 3.6 mEq/L (ref 3.5–5.3)
Sodium: 141 mEq/L (ref 135–145)
Total Bilirubin: 0.6 mg/dL (ref 0.2–1.2)
Total Protein: 7.1 g/dL (ref 6.0–8.3)

## 2014-05-16 NOTE — Progress Notes (Signed)
Patient presents to clinic today c/o urinary urgency, frequency and dysuria x 1 day.  Symptoms were present yesterday.  Are lingering today but are not as severe.  Denies hematuria.  Denies nausea/ vomiting or low back pain. Denies fever at present but endorses she has been having low grade fevers of 99.4-99.8 in the evenings, intermittently over the past few months. Does have history of liver cirrhosis.  Denies abdominal pain, dark urine, yellowing of the skin.  Denies recent URI illness.  Denies other complaints at today's visit.  Past Medical History  Diagnosis Date  . Aortic stenosis     moderate by echo 6/11 gradient 20/21mmHg for mean and peak  . TIA (transient ischemic attack)     Dr. [name]  . Peripheral vascular disease   . IBS (irritable bowel syndrome)   . Diabetes mellitus     type II  . Chronic bronchitis   . COPD (chronic obstructive pulmonary disease)   . Atherosclerosis   . Depression   . Anxiety     Dr Casimiro Needle  . Renal cyst   . Anemia     iron deficiency  . Cellulitis   . Diverticular disease   . Infectious diarrhea(009.2)   . Sinusitis   . Thrush   . Abnormal liver function test   . GERD (gastroesophageal reflux disease)   . Insomnia     chronic  . Adenocarcinoma 2008    colon,s/p right hemicolectomy  . Angina   . Seizures     hx of one seizure "  . Cirrhosis of liver     hx of  . Clostridium difficile diarrhea   . Thrombocytopenia 04/01/2013  . Bursitis     spine per pt  . Macular degeneration 02/18/2014    Follows with [facility]  . Internal hemorrhoids     Current Outpatient Prescriptions on File Prior to Visit  Medication Sig Dispense Refill  . albuterol (PROAIR HFA) 108 (90 BASE) MCG/ACT inhaler Inhale 2 puffs into the lungs every 4 (four) hours as needed for wheezing or shortness of breath.      Marland Kitchen albuterol (PROVENTIL) (2.5 MG/3ML) 0.083% nebulizer solution Take 2.5 mg by nebulization every 4 (four) hours as needed for wheezing or shortness of  breath.      . cholestyramine (QUESTRAN) 4 G packet Take 4 g by mouth daily as needed (diarrhea).       . clonazePAM (KLONOPIN) 1 MG tablet Take 1 mg by mouth 2 (two) times daily as needed.       . clopidogrel (PLAVIX) 75 MG tablet Take 1 tablet (75 mg total) by mouth daily.  90 tablet  1  . cyanocobalamin 500 MCG tablet Take 500 mcg by mouth daily.      Marland Kitchen desvenlafaxine (PRISTIQ) 100 MG 24 hr tablet Take 100 mg by mouth daily.       . ferrous sulfate 325 (65 FE) MG tablet Take 1 tablet (325 mg total) by mouth 2 (two) times daily with a meal.  60 tablet  2  . fluticasone (FLONASE) 50 MCG/ACT nasal spray Place 2 sprays into both nostrils daily.  16 g  2  . Fluticasone-Salmeterol (ADVAIR) 250-50 MCG/DOSE AEPB Inhale 1 puff into the lungs 2 (two) times daily.  1 each  6  . Multiple Vitamin (MULTIVITAMIN WITH MINERALS) TABS tablet Take 1 tablet by mouth daily.      . Multiple Vitamins-Minerals (OCUVITE PRESERVISION) TABS Take by mouth 2 (two) times daily.      Marland Kitchen  ondansetron (ZOFRAN) 4 MG tablet Take 1 tablet (4 mg total) by mouth every 6 (six) hours as needed for nausea or vomiting.  30 tablet  1  . pantoprazole (PROTONIX) 40 MG tablet Take 1 tablet (40 mg total) by mouth daily.  30 tablet  3  . phenytoin (DILANTIN) 100 MG ER capsule Take 2 capsules (200 mg total) by mouth at bedtime.  60 capsule  3  . Polyethyl Glycol-Propyl Glycol (SYSTANE) 0.4-0.3 % SOLN Place 1 drop into both eyes 2 (two) times daily as needed.      . potassium chloride (K-DUR) 10 MEQ tablet Take 10 mEq by mouth 2 (two) times daily.      Marland Kitchen saccharomyces boulardii (FLORASTOR) 250 MG capsule Take 250 mg by mouth daily.      . traMADol (ULTRAM) 50 MG tablet Take 50 mg by mouth 2 (two) times daily as needed.       No current facility-administered medications on file prior to visit.    Allergies  Allergen Reactions  . Bactrim [Sulfamethoxazole-Trimethoprim] Shortness Of Breath  . Levofloxacin Other (See Comments)    seizure  .  Pneumovax [Pneumococcal Polysaccharide Vaccine] Other (See Comments)    Severe allergy-cellulitis  . Amoxicillin-Pot Clavulanate Other (See Comments)    Patient stated Drs. recommend taking Augmentin due to her PCN allergy.  . Penicillins Rash  . Bupropion   . Haldol [Haloperidol Lactate]     Had unknown reaction many yrs ago per pt  . Haloperidol   . Morphine   . Morphine And Related     Confused and agitation  . Pneumococcal Vaccine   . Pseudoephedrine Hcl   . Sudafed [Pseudoephedrine Hcl] Other (See Comments)    seizure    Family History  Problem Relation Age of Onset  . Colon cancer Paternal Grandmother   . Esophageal cancer Brother   . Heart disease Mother   . Diabetes Brother   . Hypertension Father   . Rectal cancer Neg Hx   . Stomach cancer Neg Hx     History   Social History  . Marital Status: Widowed    Spouse Name: N/A    Number of Children: 3  . Years of Education: N/A   Occupational History  . retired   .     Social History Main Topics  . Smoking status: Former Smoker -- 0.50 packs/day for 70 years    Types: Cigarettes    Quit date: 01/03/2014  . Smokeless tobacco: Never Used     Comment: Used Electronic cigarette  . Alcohol Use: No  . Drug Use: No  . Sexual Activity: No   Other Topics Concern  . None   Social History Narrative   Patient signed a Environmental consultant to allow her daughter, Sorah Falkenstein, to have access to her medical records/information. Entered by Fleet Contras March 24,2011 @ 2:47 pm   Dailly Caffeine Use   Review of Systems - See HPI.  All other ROS are negative.  BP 146/78  Pulse 90  Temp(Src) 98.2 F (36.8 C) (Oral)  Resp 18  Ht 5' 5.5" (1.664 m)  Wt 129 lb 4 oz (58.627 kg)  BMI 21.17 kg/m2  SpO2 97%  Physical Exam  Vitals reviewed. Constitutional: She is oriented to person, place, and time and well-developed, well-nourished, and in no distress.  HENT:  Head: Normocephalic and atraumatic.  Right Ear:  External ear normal.  Left Ear: External ear normal.  Nose: Nose normal.  Mouth/Throat: Oropharynx  is clear and moist. No oropharyngeal exudate.  TM within normal limits.  Eyes: Conjunctivae are normal. Pupils are equal, round, and reactive to light.  Neck: Neck supple.  Cardiovascular: Normal rate, regular rhythm, normal heart sounds and intact distal pulses.   Pulmonary/Chest: Effort normal and breath sounds normal. No respiratory distress. She has no wheezes. She has no rales. She exhibits no tenderness.  Abdominal: Soft. Bowel sounds are normal. She exhibits no distension and no mass. There is no tenderness. There is no rebound and no guarding.  Negative CVA tenderness.  Lymphadenopathy:    She has no cervical adenopathy.  Neurological: She is alert and oriented to person, place, and time.  Skin: Skin is warm and dry. No rash noted.  Psychiatric: Affect normal.    Recent Results (from the past 2160 hour(s))  CBC WITH DIFFERENTIAL     Status: Abnormal   Collection Time    03/14/14 12:14 PM      Result Value Ref Range   WBC 8.7  4.0 - 10.5 K/uL   RBC 4.06  3.87 - 5.11 MIL/uL   Hemoglobin 10.5 (*) 12.0 - 15.0 g/dL   HCT 33.2 (*) 36.0 - 46.0 %   MCV 81.8  78.0 - 100.0 fL   MCH 25.9 (*) 26.0 - 34.0 pg   MCHC 31.6  30.0 - 36.0 g/dL   RDW 15.1  11.5 - 15.5 %   Platelets 131 (*) 150 - 400 K/uL   Neutrophils Relative % 69  43 - 77 %   Neutro Abs 5.9  1.7 - 7.7 K/uL   Lymphocytes Relative 19  12 - 46 %   Lymphs Abs 1.7  0.7 - 4.0 K/uL   Monocytes Relative 10  3 - 12 %   Monocytes Absolute 0.9  0.1 - 1.0 K/uL   Eosinophils Relative 2  0 - 5 %   Eosinophils Absolute 0.2  0.0 - 0.7 K/uL   Basophils Relative 0  0 - 1 %   Basophils Absolute 0.0  0.0 - 0.1 K/uL  COMPREHENSIVE METABOLIC PANEL     Status: Abnormal   Collection Time    03/14/14 12:14 PM      Result Value Ref Range   Sodium 142  137 - 147 mEq/L   Potassium 4.0  3.7 - 5.3 mEq/L   Chloride 104  96 - 112 mEq/L   CO2 26   19 - 32 mEq/L   Glucose, Bld 142 (*) 70 - 99 mg/dL   BUN 11  6 - 23 mg/dL   Creatinine, Ser 0.74  0.50 - 1.10 mg/dL   Calcium 8.4  8.4 - 10.5 mg/dL   Total Protein 6.8  6.0 - 8.3 g/dL   Albumin 2.7 (*) 3.5 - 5.2 g/dL   AST 23  0 - 37 U/L   ALT 11  0 - 35 U/L   Alkaline Phosphatase 116  39 - 117 U/L   Total Bilirubin 0.6  0.3 - 1.2 mg/dL   GFR calc non Af Amer 77 (*) >90 mL/min   GFR calc Af Amer 89 (*) >90 mL/min   Comment: (NOTE)     The eGFR has been calculated using the CKD EPI equation.     This calculation has not been validated in all clinical situations.     eGFR's persistently <90 mL/min signify possible Chronic Kidney     Disease.  LIPASE, BLOOD     Status: None   Collection Time    03/14/14  12:14 PM      Result Value Ref Range   Lipase 59  11 - 59 U/L  PRO B NATRIURETIC PEPTIDE     Status: Abnormal   Collection Time    03/14/14 12:14 PM      Result Value Ref Range   Pro B Natriuretic peptide (BNP) 604.7 (*) 0 - 450 pg/mL  I-STAT TROPOININ, ED     Status: None   Collection Time    03/14/14 12:19 PM      Result Value Ref Range   Troponin i, poc 0.01  0.00 - 0.08 ng/mL   Comment 3            Comment: Due to the release kinetics of cTnI,     a negative result within the first hours     of the onset of symptoms does not rule out     myocardial infarction with certainty.     If myocardial infarction is still suspected,     repeat the test at appropriate intervals.  I-STAT CG4 LACTIC ACID, ED     Status: None   Collection Time    03/14/14 12:24 PM      Result Value Ref Range   Lactic Acid, Venous 1.17  0.5 - 2.2 mmol/L  URINALYSIS, ROUTINE W REFLEX MICROSCOPIC     Status: Abnormal   Collection Time    03/14/14  1:12 PM      Result Value Ref Range   Color, Urine AMBER (*) YELLOW   Comment: BIOCHEMICALS MAY BE AFFECTED BY COLOR   APPearance CLEAR  CLEAR   Specific Gravity, Urine 1.020  1.005 - 1.030   pH 6.0  5.0 - 8.0   Glucose, UA NEGATIVE  NEGATIVE mg/dL    Hgb urine dipstick NEGATIVE  NEGATIVE   Bilirubin Urine NEGATIVE  NEGATIVE   Ketones, ur NEGATIVE  NEGATIVE mg/dL   Protein, ur 100 (*) NEGATIVE mg/dL   Urobilinogen, UA 0.2  0.0 - 1.0 mg/dL   Nitrite NEGATIVE  NEGATIVE   Leukocytes, UA TRACE (*) NEGATIVE  URINE MICROSCOPIC-ADD ON     Status: Abnormal   Collection Time    03/14/14  1:12 PM      Result Value Ref Range   Squamous Epithelial / LPF RARE  RARE   WBC, UA 0-2  <3 WBC/hpf   Casts HYALINE CASTS (*) NEGATIVE  BASIC METABOLIC PANEL     Status: Abnormal   Collection Time    03/15/14  4:43 AM      Result Value Ref Range   Sodium 140  137 - 147 mEq/L   Potassium 3.8  3.7 - 5.3 mEq/L   Chloride 102  96 - 112 mEq/L   CO2 26  19 - 32 mEq/L   Glucose, Bld 198 (*) 70 - 99 mg/dL   BUN 12  6 - 23 mg/dL   Creatinine, Ser 0.74  0.50 - 1.10 mg/dL   Calcium 8.6  8.4 - 10.5 mg/dL   GFR calc non Af Amer 77 (*) >90 mL/min   GFR calc Af Amer 89 (*) >90 mL/min   Comment: (NOTE)     The eGFR has been calculated using the CKD EPI equation.     This calculation has not been validated in all clinical situations.     eGFR's persistently <90 mL/min signify possible Chronic Kidney     Disease.  CBC     Status: Abnormal   Collection Time    03/15/14  4:43 AM      Result Value Ref Range   WBC 6.0  4.0 - 10.5 K/uL   RBC 3.84 (*) 3.87 - 5.11 MIL/uL   Hemoglobin 10.1 (*) 12.0 - 15.0 g/dL   HCT 31.3 (*) 36.0 - 46.0 %   MCV 81.5  78.0 - 100.0 fL   MCH 26.3  26.0 - 34.0 pg   MCHC 32.3  30.0 - 36.0 g/dL   RDW 14.9  11.5 - 15.5 %   Platelets 113 (*) 150 - 400 K/uL   Comment: WHITE COUNT CONFIRMED ON SMEAR     PLATELET COUNT CONFIRMED BY SMEAR  CBC WITH DIFFERENTIAL     Status: Abnormal   Collection Time    05/16/14  2:46 PM      Result Value Ref Range   WBC 8.9  4.0 - 10.5 K/uL   RBC 4.54  3.87 - 5.11 MIL/uL   Hemoglobin 12.0  12.0 - 15.0 g/dL   HCT 36.5  36.0 - 46.0 %   MCV 80.4  78.0 - 100.0 fL   MCH 26.4  26.0 - 34.0 pg   MCHC 32.9   30.0 - 36.0 g/dL   RDW 18.3 (*) 11.5 - 15.5 %   Platelets 164  150 - 400 K/uL   Neutrophils Relative % 71  43 - 77 %   Neutro Abs 6.3  1.7 - 7.7 K/uL   Lymphocytes Relative 17  12 - 46 %   Lymphs Abs 1.5  0.7 - 4.0 K/uL   Monocytes Relative 8  3 - 12 %   Monocytes Absolute 0.7  0.1 - 1.0 K/uL   Eosinophils Relative 3  0 - 5 %   Eosinophils Absolute 0.3  0.0 - 0.7 K/uL   Basophils Relative 1  0 - 1 %   Basophils Absolute 0.1  0.0 - 0.1 K/uL   Smear Review Criteria for review not met    COMPLETE METABOLIC PANEL WITH GFR     Status: Abnormal   Collection Time    05/16/14  2:46 PM      Result Value Ref Range   Sodium 141  135 - 145 mEq/L   Potassium 3.6  3.5 - 5.3 mEq/L   Chloride 103  96 - 112 mEq/L   CO2 26  19 - 32 mEq/L   Glucose, Bld 219 (*) 70 - 99 mg/dL   BUN 11  6 - 23 mg/dL   Creat 0.90  0.50 - 1.10 mg/dL   Total Bilirubin 0.6  0.2 - 1.2 mg/dL   Alkaline Phosphatase 117  39 - 117 U/L   AST 27  0 - 37 U/L   ALT 12  0 - 35 U/L   Total Protein 7.1  6.0 - 8.3 g/dL   Albumin 3.7  3.5 - 5.2 g/dL   Calcium 8.9  8.4 - 10.5 mg/dL   GFR, Est African American 69     GFR, Est Non African American 60     Comment:       The estimated GFR is a calculation valid for adults (>=91 years old)     that uses the CKD-EPI algorithm to adjust for age and sex. It is       not to be used for children, pregnant women, hospitalized patients,        patients on dialysis, or with rapidly changing kidney function.     According to the NKDEP, eGFR >89 is normal, 60-89 shows mild  impairment, 30-59 shows moderate impairment, 15-29 shows severe     impairment and <15 is ESRD.        CMV IGM     Status: None   Collection Time    05/16/14  2:46 PM      Result Value Ref Range   CMV IgM <8.00  <30.00 AU/mL   Comment:       Reference Range:        <30.00 AU/mL = Negative                        30.00-34.99 AU/mL = Equivocal                            >=35.00 AU/mL = Positive           Results  from any one IgM assay should not be used as a sole     determinant of a current or recent infection. Because an IgM test can     yield false positive results and low levels of IgM antibody may     persist for more than 12 months post infection, reliance on a single     test result could be misleading. If an acute infection is suspected,     consider obtaining a new specimen and submit for both IgG and IgM     testing in two or more weeks.        CYTOMEGALOVIRUS ANTIBODY, IGG     Status: Abnormal   Collection Time    05/16/14  2:46 PM      Result Value Ref Range   Cytomegalovirus Ab-IgG >10.00 (*) <0.60 U/mL   Comment:       Reference Range:          <0.60 U/mL = Negative                           0.60-0.69 U/mL = Equivocal                              >=0.70 U/mL = Positive            A positive result indicates that the patient has antibodies to CMV.     It does not differentiate between an active or past infection.     The clinical diagnosis must be interpreted in conjunction with the     clinical signs and symptoms of the patient.        SEDIMENTATION RATE     Status: Abnormal   Collection Time    05/16/14  2:46 PM      Result Value Ref Range   Sed Rate 42 (*) 0 - 22 mm/hr  CULTURE, URINE COMPREHENSIVE     Status: None   Collection Time    05/16/14  4:51 PM      Result Value Ref Range   Colony Count 20,OOO COLONIES/ML     Organism ID, Bacteria GROUP B STREP (S.AGALACTIAE) ISOLATED     Comment: Testing against S. agalactiae not routinely     performed due to predictability of     AMP/PEN/VAN susceptibility.  POCT URINALYSIS DIPSTICK     Status: None   Collection Time    05/16/14  5:01 PM      Result Value Ref Range   Color, UA Gold  Clarity, UA clear     Glucose, UA neg     Bilirubin, UA neg     Ketones, UA neg     Spec Grav, UA 1.020     Blood, UA trace     pH, UA 7.0     Protein, UA 300+++     Urobilinogen, UA 0.2     Nitrite, UA neg     Leukocytes, UA  Negative      Assessment/Plan: UTI (lower urinary tract infection) Initially with only possibility of UTI, patient wished to avoid antibiotic use due to hx of C. Diff.  Urine culture is positive for UTI caused by Group B strep. Will empirically treat with Cefuroxime as patient is penicillin and sulfa antibiotic allergic.  Increase fluids.  Rest. Probiotic.   Recent unexplained fever Low-grade temperature in the evenings are not high enough to constitute a true fever.  Patient with history of cirrhosis which can have impact of temperature.  Due to concerns, will obtain labs at today's visit.  Will also reassess after UTI is treated.

## 2014-05-16 NOTE — Progress Notes (Signed)
Pre visit review using our clinic review tool, if applicable. No additional management support is needed unless otherwise documented below in the visit note/SLS  

## 2014-05-16 NOTE — Patient Instructions (Signed)
Please obtain labs.  I will call you with your results.  Stay well hydrated. Restart your daily probiotic. Please resume medications as directed, including ALBUTEROL inhaler.  If fever returns and is above 100.9, please call our office.

## 2014-05-17 LAB — SEDIMENTATION RATE: Sed Rate: 42 mm/hr — ABNORMAL HIGH (ref 0–22)

## 2014-05-17 LAB — CMV IGM: CMV IgM: <8 AU/mL (ref <30.00)

## 2014-05-17 LAB — CYTOMEGALOVIRUS ANTIBODY, IGG

## 2014-05-18 ENCOUNTER — Telehealth: Payer: Self-pay | Admitting: Physician Assistant

## 2014-05-18 DIAGNOSIS — N390 Urinary tract infection, site not specified: Secondary | ICD-10-CM

## 2014-05-18 DIAGNOSIS — R509 Fever, unspecified: Secondary | ICD-10-CM | POA: Insufficient documentation

## 2014-05-18 LAB — CULTURE, URINE COMPREHENSIVE

## 2014-05-18 MED ORDER — CEFUROXIME AXETIL 250 MG PO TABS: 250.0000 mg | ORAL_TABLET | Freq: Two times a day (BID) | ORAL | Status: DC

## 2014-05-18 NOTE — Telephone Encounter (Signed)
Urine culture shows UTI.  Will Rx Cefuroxime twice daily for 3 days.  This antibiotic has a low incidence of causing the C. Diff. I recommend she take a probiotic twice daily while on this medication and for a week after stopping antibiotic.  Follow-up in 1 week if no improvement in symptoms.

## 2014-05-18 NOTE — Telephone Encounter (Signed)
Patient informed, understood & agreed/SLS  

## 2014-05-18 NOTE — Assessment & Plan Note (Signed)
Low-grade temperature in the evenings are not high enough to constitute a true fever.  Patient with history of cirrhosis which can have impact of temperature.  Due to concerns, will obtain labs at today's visit.  Will also reassess after UTI is treated.

## 2014-05-18 NOTE — Assessment & Plan Note (Signed)
Initially with only possibility of UTI, patient wished to avoid antibiotic use due to hx of C. Diff.  Urine culture is positive for UTI caused by Group B strep. Will empirically treat with Cefuroxime as patient is penicillin and sulfa antibiotic allergic.  Increase fluids.  Rest. Probiotic.

## 2014-05-21 ENCOUNTER — Telehealth: Payer: Self-pay | Admitting: *Deleted

## 2014-05-21 ENCOUNTER — Ambulatory Visit (HOSPITAL_BASED_OUTPATIENT_CLINIC_OR_DEPARTMENT_OTHER)
Admission: RE | Admit: 2014-05-21 | Discharge: 2014-05-21 | Disposition: A | Discharge status: Home / Self Care | Payer: Medicare Other | Source: Ambulatory Visit | Attending: Physician Assistant | Admitting: Physician Assistant

## 2014-05-21 ENCOUNTER — Ambulatory Visit (INDEPENDENT_AMBULATORY_CARE_PROVIDER_SITE_OTHER): Payer: Medicare Other | Admitting: Physician Assistant

## 2014-05-21 ENCOUNTER — Encounter: Payer: Self-pay | Admitting: Physician Assistant

## 2014-05-21 VITALS — BP 142/68 | HR 91 | Temp 98.4°F | Resp 20 | Ht 65.5 in | Wt 132.2 lb

## 2014-05-21 DIAGNOSIS — E119 Type 2 diabetes mellitus without complications: Secondary | ICD-10-CM

## 2014-05-21 DIAGNOSIS — J441 Chronic obstructive pulmonary disease with (acute) exacerbation: Secondary | ICD-10-CM | POA: Diagnosis present

## 2014-05-21 DIAGNOSIS — J440 Chronic obstructive pulmonary disease with acute lower respiratory infection: Secondary | ICD-10-CM | POA: Diagnosis not present

## 2014-05-21 DIAGNOSIS — R0789 Other chest pain: Secondary | ICD-10-CM

## 2014-05-21 DIAGNOSIS — I6529 Occlusion and stenosis of unspecified carotid artery: Secondary | ICD-10-CM

## 2014-05-21 DIAGNOSIS — R0602 Shortness of breath: Secondary | ICD-10-CM

## 2014-05-21 DIAGNOSIS — J209 Acute bronchitis, unspecified: Secondary | ICD-10-CM | POA: Insufficient documentation

## 2014-05-21 LAB — CBC WITH DIFFERENTIAL/PLATELET
BASOS PCT: 0 % (ref 0–1)
Basophils Absolute: 0 10*3/uL (ref 0.0–0.1)
EOS PCT: 2 % (ref 0–5)
Eosinophils Absolute: 0.2 10*3/uL (ref 0.0–0.7)
HEMATOCRIT: 33.7 % — AB (ref 36.0–46.0)
Hemoglobin: 11.2 g/dL — ABNORMAL LOW (ref 12.0–15.0)
Lymphocytes Relative: 15 % (ref 12–46)
Lymphs Abs: 1.5 10*3/uL (ref 0.7–4.0)
MCH: 26.9 pg (ref 26.0–34.0)
MCHC: 33.2 g/dL (ref 30.0–36.0)
MCV: 81 fL (ref 78.0–100.0)
MONO ABS: 0.9 10*3/uL (ref 0.1–1.0)
Monocytes Relative: 9 % (ref 3–12)
Neutro Abs: 7.5 10*3/uL (ref 1.7–7.7)
Neutrophils Relative %: 74 % (ref 43–77)
Platelets: 120 10*3/uL — ABNORMAL LOW (ref 150–400)
RBC: 4.16 MIL/uL (ref 3.87–5.11)
RDW: 17.6 % — AB (ref 11.5–15.5)
WBC: 10.2 10*3/uL (ref 4.0–10.5)

## 2014-05-21 LAB — BASIC METABOLIC PANEL
BUN: 10 mg/dL (ref 6–23)
CHLORIDE: 103 meq/L (ref 96–112)
CO2: 28 mEq/L (ref 19–32)
CREATININE: 0.74 mg/dL (ref 0.50–1.10)
Calcium: 8.3 mg/dL — ABNORMAL LOW (ref 8.4–10.5)
Glucose, Bld: 199 mg/dL — ABNORMAL HIGH (ref 70–99)
Potassium: 4.1 mEq/L (ref 3.5–5.3)
Sodium: 139 mEq/L (ref 135–145)

## 2014-05-21 MED ORDER — PREDNISONE 20 MG PO TABS: 20.0000 mg | ORAL_TABLET | Freq: Every day | ORAL | Status: DC

## 2014-05-21 MED ORDER — AZITHROMYCIN 250 MG PO TABS: ORAL_TABLET | ORAL | Status: DC

## 2014-05-21 MED ORDER — GLIMEPIRIDE 1 MG PO TABS: 1.0000 mg | ORAL_TABLET | Freq: Every day | ORAL | Status: DC

## 2014-05-21 NOTE — Progress Notes (Signed)
Pre visit review using our clinic review tool, if applicable. No additional management support is needed unless otherwise documented below in the visit note/SLS  

## 2014-05-21 NOTE — Telephone Encounter (Signed)
Patient did not follow CAN instructions and called PCP office and was given appointment at 10:00am with Elyn Aquas, PA-C/SLS  [place] Triage Call Report Triage Record Num: [id number] Operator: Abe People Patient Name: Monique Fleming Call Date & Time: 05/20/2014 11:21:52AM Patient Phone: [phone] PCP: Gwyneth Revels Patient Gender: Female PCP Fax : [phone] Patient DOB: 1929-04-19 Practice Name: [facility] Reason for Call: Caller: [name]/Other; PCP: Penni Homans (Family Practice); CB#: [phone]; Call regarding Urinary Pain/Bleeding; Pt was seen 05/16/14 and placed on Ceftin for UTI. Now she is feeling worse and temp last night 05/19/14 was 102.1. Daughter is not with pt now. PA aware of labs and culture. They will get pt's temp this AM and call back. Temp this AM 100.0 orally. Triaged Urinary Symptoms Female and needs to see provider within 4 hrs for any temp elevation in a afrail elderly person. Called Dr. Carolann Littler and he ordered to offer her to go to the ED to see where the infection is really coming from. She will go to [facility]. Call back instructions given. Protocol(s) Used: Urinary Symptoms - Female Recommended Outcome per Protocol: See Provider within 4 hours Reason for Outcome: Any temperature elevation in a frail elderly, immunocompromised or pregnant person Care Advice: ~ 05/20/2014 12:44:52PM Page 1 of 1 [email or web address]

## 2014-05-21 NOTE — Progress Notes (Signed)
Patient presents to clinic today c/o fever, chills, congestion, productive cough and fatigue that has been present since her visit last week.  Endorses shortness of breath but denies pleuritic chest pain.  Patient has history of COPD.  Has been taking medications as directed.  Used her Albuterol Nebulizer 1 hour prior to appointment.  Patient was seen last week for UTI.  Patient completed round of antibiotic.  Denies persistent symptoms.  Past Medical History  Diagnosis Date  . Aortic stenosis     moderate by echo 6/11 gradient 20/74mmHg for mean and peak  . TIA (transient ischemic attack)     Dr. [name]  . Peripheral vascular disease   . IBS (irritable bowel syndrome)   . Diabetes mellitus     type II  . Chronic bronchitis   . COPD (chronic obstructive pulmonary disease)   . Atherosclerosis   . Depression   . Anxiety     Dr Casimiro Needle  . Renal cyst   . Anemia     iron deficiency  . Cellulitis   . Diverticular disease   . Infectious diarrhea(009.2)   . Sinusitis   . Thrush   . Abnormal liver function test   . GERD (gastroesophageal reflux disease)   . Insomnia     chronic  . Adenocarcinoma 2008    colon,s/p right hemicolectomy  . Angina   . Seizures     hx of one seizure "  . Cirrhosis of liver     hx of  . Clostridium difficile diarrhea   . Thrombocytopenia 04/01/2013  . Bursitis     spine per pt  . Macular degeneration 02/18/2014    Follows with [facility]  . Internal hemorrhoids     Current Outpatient Prescriptions on File Prior to Visit  Medication Sig Dispense Refill  . albuterol (PROAIR HFA) 108 (90 BASE) MCG/ACT inhaler Inhale 2 puffs into the lungs every 4 (four) hours as needed for wheezing or shortness of breath.      Marland Kitchen albuterol (PROVENTIL) (2.5 MG/3ML) 0.083% nebulizer solution Take 2.5 mg by nebulization every 4 (four) hours as needed for wheezing or shortness of breath.      . cholestyramine (QUESTRAN) 4 G packet Take 4 g by mouth daily as needed (diarrhea).        . clonazePAM (KLONOPIN) 1 MG tablet Take 1 mg by mouth 2 (two) times daily as needed.       . clopidogrel (PLAVIX) 75 MG tablet Take 1 tablet (75 mg total) by mouth daily.  90 tablet  1  . cyanocobalamin 500 MCG tablet Take 500 mcg by mouth daily.      Marland Kitchen desvenlafaxine (PRISTIQ) 100 MG 24 hr tablet Take 100 mg by mouth daily.       . ferrous sulfate 325 (65 FE) MG tablet Take 1 tablet (325 mg total) by mouth 2 (two) times daily with a meal.  60 tablet  2  . fluticasone (FLONASE) 50 MCG/ACT nasal spray Place 2 sprays into both nostrils daily.  16 g  2  . Fluticasone-Salmeterol (ADVAIR) 250-50 MCG/DOSE AEPB Inhale 1 puff into the lungs 2 (two) times daily.  1 each  6  . Multiple Vitamin (MULTIVITAMIN WITH MINERALS) TABS tablet Take 1 tablet by mouth daily.      . Multiple Vitamins-Minerals (OCUVITE PRESERVISION) TABS Take by mouth 2 (two) times daily.      . ondansetron (ZOFRAN) 4 MG tablet Take 1 tablet (4 mg total) by mouth every 6 (  six) hours as needed for nausea or vomiting.  30 tablet  1  . pantoprazole (PROTONIX) 40 MG tablet Take 1 tablet (40 mg total) by mouth daily.  30 tablet  3  . phenytoin (DILANTIN) 100 MG ER capsule Take 2 capsules (200 mg total) by mouth at bedtime.  60 capsule  3  . Polyethyl Glycol-Propyl Glycol (SYSTANE) 0.4-0.3 % SOLN Place 1 drop into both eyes 2 (two) times daily as needed.      . potassium chloride (K-DUR) 10 MEQ tablet Take 10 mEq by mouth 2 (two) times daily.      Marland Kitchen saccharomyces boulardii (FLORASTOR) 250 MG capsule Take 250 mg by mouth daily.      . traMADol (ULTRAM) 50 MG tablet Take 50 mg by mouth 2 (two) times daily as needed.       No current facility-administered medications on file prior to visit.    Allergies  Allergen Reactions  . Bactrim [Sulfamethoxazole-Trimethoprim] Shortness Of Breath  . Levofloxacin Other (See Comments)    seizure  . Pneumovax [Pneumococcal Polysaccharide Vaccine] Other (See Comments)    Severe allergy-cellulitis   . Amoxicillin-Pot Clavulanate Other (See Comments)    Patient stated Drs. recommend taking Augmentin due to her PCN allergy.  . Penicillins Rash  . Bupropion   . Haldol [Haloperidol Lactate]     Had unknown reaction many yrs ago per pt  . Haloperidol   . Morphine   . Morphine And Related     Confused and agitation  . Pneumococcal Vaccine   . Pseudoephedrine Hcl   . Sudafed [Pseudoephedrine Hcl] Other (See Comments)    seizure    Family History  Problem Relation Age of Onset  . Colon cancer Paternal Grandmother   . Esophageal cancer Brother   . Heart disease Mother   . Diabetes Brother   . Hypertension Father   . Rectal cancer Neg Hx   . Stomach cancer Neg Hx     History   Social History  . Marital Status: Widowed    Spouse Name: N/A    Number of Children: 3  . Years of Education: N/A   Occupational History  . retired   .     Social History Main Topics  . Smoking status: Former Smoker -- 0.50 packs/day for 70 years    Types: Cigarettes    Quit date: 01/03/2014  . Smokeless tobacco: Never Used     Comment: Used Electronic cigarette  . Alcohol Use: No  . Drug Use: No  . Sexual Activity: No   Other Topics Concern  . None   Social History Narrative   Patient signed a Environmental consultant to allow her daughter, Kenae Lindquist, to have access to her medical records/information. Entered by Fleet Contras March 24,2011 @ 2:47 pm   Dailly Caffeine Use    Review of Systems - See HPI.  All other ROS are negative.  BP 142/68  Pulse 91  Temp(Src) 98.4 F (36.9 C) (Oral)  Resp 20  Ht 5' 5.5" (1.664 m)  Wt 132 lb 4 oz (59.988 kg)  BMI 21.66 kg/m2  SpO2 98%  Physical Exam  Vitals reviewed. Constitutional: She is oriented to person, place, and time and well-developed, well-nourished, and in no distress.  HENT:  Head: Normocephalic and atraumatic.  Right Ear: External ear normal.  Left Ear: External ear normal.  Nose: Nose normal.  Mouth/Throat: Oropharynx  is clear and moist. No oropharyngeal exudate.  TM within normal limits bilaterally.  Eyes: Conjunctivae are normal. Pupils are equal, round, and reactive to light.  Neck: Neck supple.  Cardiovascular: Normal rate, regular rhythm, normal heart sounds and intact distal pulses.   Pulmonary/Chest: No respiratory distress. She has wheezes. She has no rales. She exhibits no tenderness.  Abdominal: Soft. There is no tenderness.  Negative CVA tenderness.  Lymphadenopathy:    She has no cervical adenopathy.  Neurological: She is alert and oriented to person, place, and time.  Skin: Skin is warm and dry. No rash noted.    Recent Results (from the past 2160 hour(s))  CBC WITH DIFFERENTIAL     Status: Abnormal   Collection Time    03/14/14 12:14 PM      Result Value Ref Range   WBC 8.7  4.0 - 10.5 K/uL   RBC 4.06  3.87 - 5.11 MIL/uL   Hemoglobin 10.5 (*) 12.0 - 15.0 g/dL   HCT 33.2 (*) 36.0 - 46.0 %   MCV 81.8  78.0 - 100.0 fL   MCH 25.9 (*) 26.0 - 34.0 pg   MCHC 31.6  30.0 - 36.0 g/dL   RDW 15.1  11.5 - 15.5 %   Platelets 131 (*) 150 - 400 K/uL   Neutrophils Relative % 69  43 - 77 %   Neutro Abs 5.9  1.7 - 7.7 K/uL   Lymphocytes Relative 19  12 - 46 %   Lymphs Abs 1.7  0.7 - 4.0 K/uL   Monocytes Relative 10  3 - 12 %   Monocytes Absolute 0.9  0.1 - 1.0 K/uL   Eosinophils Relative 2  0 - 5 %   Eosinophils Absolute 0.2  0.0 - 0.7 K/uL   Basophils Relative 0  0 - 1 %   Basophils Absolute 0.0  0.0 - 0.1 K/uL  COMPREHENSIVE METABOLIC PANEL     Status: Abnormal   Collection Time    03/14/14 12:14 PM      Result Value Ref Range   Sodium 142  137 - 147 mEq/L   Potassium 4.0  3.7 - 5.3 mEq/L   Chloride 104  96 - 112 mEq/L   CO2 26  19 - 32 mEq/L   Glucose, Bld 142 (*) 70 - 99 mg/dL   BUN 11  6 - 23 mg/dL   Creatinine, Ser 0.74  0.50 - 1.10 mg/dL   Calcium 8.4  8.4 - 10.5 mg/dL   Total Protein 6.8  6.0 - 8.3 g/dL   Albumin 2.7 (*) 3.5 - 5.2 g/dL   AST 23  0 - 37 U/L   ALT 11  0 - 35  U/L   Alkaline Phosphatase 116  39 - 117 U/L   Total Bilirubin 0.6  0.3 - 1.2 mg/dL   GFR calc non Af Amer 77 (*) >90 mL/min   GFR calc Af Amer 89 (*) >90 mL/min   Comment: (NOTE)     The eGFR has been calculated using the CKD EPI equation.     This calculation has not been validated in all clinical situations.     eGFR's persistently <90 mL/min signify possible Chronic Kidney     Disease.  LIPASE, BLOOD     Status: None   Collection Time    03/14/14 12:14 PM      Result Value Ref Range   Lipase 59  11 - 59 U/L  PRO B NATRIURETIC PEPTIDE     Status: Abnormal   Collection Time    03/14/14 12:14 PM  Result Value Ref Range   Pro B Natriuretic peptide (BNP) 604.7 (*) 0 - 450 pg/mL  I-STAT TROPOININ, ED     Status: None   Collection Time    03/14/14 12:19 PM      Result Value Ref Range   Troponin i, poc 0.01  0.00 - 0.08 ng/mL   Comment 3            Comment: Due to the release kinetics of cTnI,     a negative result within the first hours     of the onset of symptoms does not rule out     myocardial infarction with certainty.     If myocardial infarction is still suspected,     repeat the test at appropriate intervals.  I-STAT CG4 LACTIC ACID, ED     Status: None   Collection Time    03/14/14 12:24 PM      Result Value Ref Range   Lactic Acid, Venous 1.17  0.5 - 2.2 mmol/L  URINALYSIS, ROUTINE W REFLEX MICROSCOPIC     Status: Abnormal   Collection Time    03/14/14  1:12 PM      Result Value Ref Range   Color, Urine AMBER (*) YELLOW   Comment: BIOCHEMICALS MAY BE AFFECTED BY COLOR   APPearance CLEAR  CLEAR   Specific Gravity, Urine 1.020  1.005 - 1.030   pH 6.0  5.0 - 8.0   Glucose, UA NEGATIVE  NEGATIVE mg/dL   Hgb urine dipstick NEGATIVE  NEGATIVE   Bilirubin Urine NEGATIVE  NEGATIVE   Ketones, ur NEGATIVE  NEGATIVE mg/dL   Protein, ur 100 (*) NEGATIVE mg/dL   Urobilinogen, UA 0.2  0.0 - 1.0 mg/dL   Nitrite NEGATIVE  NEGATIVE   Leukocytes, UA TRACE (*) NEGATIVE   URINE MICROSCOPIC-ADD ON     Status: Abnormal   Collection Time    03/14/14  1:12 PM      Result Value Ref Range   Squamous Epithelial / LPF RARE  RARE   WBC, UA 0-2  <3 WBC/hpf   Casts HYALINE CASTS (*) NEGATIVE  BASIC METABOLIC PANEL     Status: Abnormal   Collection Time    03/15/14  4:43 AM      Result Value Ref Range   Sodium 140  137 - 147 mEq/L   Potassium 3.8  3.7 - 5.3 mEq/L   Chloride 102  96 - 112 mEq/L   CO2 26  19 - 32 mEq/L   Glucose, Bld 198 (*) 70 - 99 mg/dL   BUN 12  6 - 23 mg/dL   Creatinine, Ser 0.74  0.50 - 1.10 mg/dL   Calcium 8.6  8.4 - 10.5 mg/dL   GFR calc non Af Amer 77 (*) >90 mL/min   GFR calc Af Amer 89 (*) >90 mL/min   Comment: (NOTE)     The eGFR has been calculated using the CKD EPI equation.     This calculation has not been validated in all clinical situations.     eGFR's persistently <90 mL/min signify possible Chronic Kidney     Disease.  CBC     Status: Abnormal   Collection Time    03/15/14  4:43 AM      Result Value Ref Range   WBC 6.0  4.0 - 10.5 K/uL   RBC 3.84 (*) 3.87 - 5.11 MIL/uL   Hemoglobin 10.1 (*) 12.0 - 15.0 g/dL   HCT 31.3 (*) 36.0 - 46.0 %  MCV 81.5  78.0 - 100.0 fL   MCH 26.3  26.0 - 34.0 pg   MCHC 32.3  30.0 - 36.0 g/dL   RDW 14.9  11.5 - 15.5 %   Platelets 113 (*) 150 - 400 K/uL   Comment: WHITE COUNT CONFIRMED ON SMEAR     PLATELET COUNT CONFIRMED BY SMEAR  CBC WITH DIFFERENTIAL     Status: Abnormal   Collection Time    05/16/14  2:46 PM      Result Value Ref Range   WBC 8.9  4.0 - 10.5 K/uL   RBC 4.54  3.87 - 5.11 MIL/uL   Hemoglobin 12.0  12.0 - 15.0 g/dL   HCT 36.5  36.0 - 46.0 %   MCV 80.4  78.0 - 100.0 fL   MCH 26.4  26.0 - 34.0 pg   MCHC 32.9  30.0 - 36.0 g/dL   RDW 18.3 (*) 11.5 - 15.5 %   Platelets 164  150 - 400 K/uL   Neutrophils Relative % 71  43 - 77 %   Neutro Abs 6.3  1.7 - 7.7 K/uL   Lymphocytes Relative 17  12 - 46 %   Lymphs Abs 1.5  0.7 - 4.0 K/uL   Monocytes Relative 8  3 - 12 %    Monocytes Absolute 0.7  0.1 - 1.0 K/uL   Eosinophils Relative 3  0 - 5 %   Eosinophils Absolute 0.3  0.0 - 0.7 K/uL   Basophils Relative 1  0 - 1 %   Basophils Absolute 0.1  0.0 - 0.1 K/uL   Smear Review Criteria for review not met    COMPLETE METABOLIC PANEL WITH GFR     Status: Abnormal   Collection Time    05/16/14  2:46 PM      Result Value Ref Range   Sodium 141  135 - 145 mEq/L   Potassium 3.6  3.5 - 5.3 mEq/L   Chloride 103  96 - 112 mEq/L   CO2 26  19 - 32 mEq/L   Glucose, Bld 219 (*) 70 - 99 mg/dL   BUN 11  6 - 23 mg/dL   Creat 0.90  0.50 - 1.10 mg/dL   Total Bilirubin 0.6  0.2 - 1.2 mg/dL   Alkaline Phosphatase 117  39 - 117 U/L   AST 27  0 - 37 U/L   ALT 12  0 - 35 U/L   Total Protein 7.1  6.0 - 8.3 g/dL   Albumin 3.7  3.5 - 5.2 g/dL   Calcium 8.9  8.4 - 10.5 mg/dL   GFR, Est African American 69     GFR, Est Non African American 60     Comment:       The estimated GFR is a calculation valid for adults (>=49 years old)     that uses the CKD-EPI algorithm to adjust for age and sex. It is       not to be used for children, pregnant women, hospitalized patients,        patients on dialysis, or with rapidly changing kidney function.     According to the NKDEP, eGFR >89 is normal, 60-89 shows mild     impairment, 30-59 shows moderate impairment, 15-29 shows severe     impairment and <15 is ESRD.        CMV IGM     Status: None   Collection Time    05/16/14  2:46 PM  Result Value Ref Range   CMV IgM <8.00  <30.00 AU/mL   Comment:       Reference Range:        <30.00 AU/mL = Negative                        30.00-34.99 AU/mL = Equivocal                            >=35.00 AU/mL = Positive           Results from any one IgM assay should not be used as a sole     determinant of a current or recent infection. Because an IgM test can     yield false positive results and low levels of IgM antibody may     persist for more than 12 months post infection, reliance on a  single     test result could be misleading. If an acute infection is suspected,     consider obtaining a new specimen and submit for both IgG and IgM     testing in two or more weeks.        CYTOMEGALOVIRUS ANTIBODY, IGG     Status: Abnormal   Collection Time    05/16/14  2:46 PM      Result Value Ref Range   Cytomegalovirus Ab-IgG >10.00 (*) <0.60 U/mL   Comment:       Reference Range:          <0.60 U/mL = Negative                           0.60-0.69 U/mL = Equivocal                              >=0.70 U/mL = Positive            A positive result indicates that the patient has antibodies to CMV.     It does not differentiate between an active or past infection.     The clinical diagnosis must be interpreted in conjunction with the     clinical signs and symptoms of the patient.        SEDIMENTATION RATE     Status: Abnormal   Collection Time    05/16/14  2:46 PM      Result Value Ref Range   Sed Rate 42 (*) 0 - 22 mm/hr  CULTURE, URINE COMPREHENSIVE     Status: None   Collection Time    05/16/14  4:51 PM      Result Value Ref Range   Colony Count 20,OOO COLONIES/ML     Organism ID, Bacteria GROUP B STREP (S.AGALACTIAE) ISOLATED     Comment: Testing against S. agalactiae not routinely     performed due to predictability of     AMP/PEN/VAN susceptibility.  POCT URINALYSIS DIPSTICK     Status: None   Collection Time    05/16/14  5:01 PM      Result Value Ref Range   Color, UA Gold     Clarity, UA clear     Glucose, UA neg     Bilirubin, UA neg     Ketones, UA neg     Spec Grav, UA 1.020     Blood, UA trace     pH, UA 7.0  Protein, UA 300+++     Urobilinogen, UA 0.2     Nitrite, UA neg     Leukocytes, UA Negative    CBC WITH DIFFERENTIAL     Status: Abnormal   Collection Time    05/21/14 11:09 AM      Result Value Ref Range   WBC 10.2  4.0 - 10.5 K/uL   RBC 4.16  3.87 - 5.11 MIL/uL   Hemoglobin 11.2 (*) 12.0 - 15.0 g/dL   HCT 33.7 (*) 36.0 - 46.0 %   MCV  81.0  78.0 - 100.0 fL   MCH 26.9  26.0 - 34.0 pg   MCHC 33.2  30.0 - 36.0 g/dL   RDW 17.6 (*) 11.5 - 15.5 %   Platelets 120 (*) 150 - 400 K/uL   Neutrophils Relative % 74  43 - 77 %   Neutro Abs 7.5  1.7 - 7.7 K/uL   Lymphocytes Relative 15  12 - 46 %   Lymphs Abs 1.5  0.7 - 4.0 K/uL   Monocytes Relative 9  3 - 12 %   Monocytes Absolute 0.9  0.1 - 1.0 K/uL   Eosinophils Relative 2  0 - 5 %   Eosinophils Absolute 0.2  0.0 - 0.7 K/uL   Basophils Relative 0  0 - 1 %   Basophils Absolute 0.0  0.0 - 0.1 K/uL   Smear Review Criteria for review not met    BASIC METABOLIC PANEL     Status: Abnormal   Collection Time    05/21/14 11:09 AM      Result Value Ref Range   Sodium 139  135 - 145 mEq/L   Potassium 4.1  3.5 - 5.3 mEq/L   Chloride 103  96 - 112 mEq/L   CO2 28  19 - 32 mEq/L   Glucose, Bld 199 (*) 70 - 99 mg/dL   BUN 10  6 - 23 mg/dL   Creat 0.74  0.50 - 1.10 mg/dL   Calcium 8.3 (*) 8.4 - 10.5 mg/dL    Assessment/Plan: Acute bronchitis With COPD exacerbation.  Will obtain CXR to r/o CAP. Rx Azithromycin.  Increase fluids.  Rest.  Saline nasal spray.  Probiotic.  Delsym for cough. Humidifier in bedroom.  Prednisone burst given.  Patient given refill of Amaryl 1 mg to take is fasting blood sugar > 200.  Follow-up in 3 days.  COPD exacerbation Rx Prednisone.  Monitor glucose.  Continue chronic COPD medications as directed.

## 2014-05-21 NOTE — Patient Instructions (Signed)
Please take antibiotic as directed.  Take your probiotic daily while on antibiotic.  Take the prednisone as directed.  Check blood sugar each morning.  If blood sugar > 200, Take the Amaryl.  If blood sugar does not come down into the 100s, please call the office.  If you develop lightheadedness, dizziness, shakiness, please check sugar.  We don't want your sugar to be less than 60.  If this occurs eat some candy or drink some juice and call the office.  Please do not forget to go downstairs for chest x-ray.  I will call you with your result.  Follow-up with me in 2-3 days.

## 2014-05-22 DIAGNOSIS — J209 Acute bronchitis, unspecified: Secondary | ICD-10-CM | POA: Insufficient documentation

## 2014-05-22 DIAGNOSIS — J441 Chronic obstructive pulmonary disease with (acute) exacerbation: Secondary | ICD-10-CM | POA: Insufficient documentation

## 2014-05-22 NOTE — Assessment & Plan Note (Signed)
Rx Prednisone.  Monitor glucose.  Continue chronic COPD medications as directed.

## 2014-05-22 NOTE — Assessment & Plan Note (Signed)
With COPD exacerbation.  Will obtain CXR to r/o CAP. Rx Azithromycin.  Increase fluids.  Rest.  Saline nasal spray.  Probiotic.  Delsym for cough. Humidifier in bedroom.  Prednisone burst given.  Patient given refill of Amaryl 1 mg to take is fasting blood sugar > 200.  Follow-up in 3 days.

## 2014-05-24 ENCOUNTER — Ambulatory Visit (INDEPENDENT_AMBULATORY_CARE_PROVIDER_SITE_OTHER): Payer: Medicare Other | Admitting: Physician Assistant

## 2014-05-24 ENCOUNTER — Encounter: Payer: Self-pay | Admitting: Physician Assistant

## 2014-05-24 VITALS — BP 128/78 | HR 85 | Temp 98.2°F | Resp 20 | Ht 65.5 in | Wt 132.0 lb

## 2014-05-24 DIAGNOSIS — I6529 Occlusion and stenosis of unspecified carotid artery: Secondary | ICD-10-CM

## 2014-05-24 DIAGNOSIS — J209 Acute bronchitis, unspecified: Secondary | ICD-10-CM

## 2014-05-24 DIAGNOSIS — J441 Chronic obstructive pulmonary disease with (acute) exacerbation: Secondary | ICD-10-CM

## 2014-05-24 MED ORDER — IPRATROPIUM-ALBUTEROL 0.5-2.5 (3) MG/3ML IN SOLN
3.0000 mL | Freq: Once | RESPIRATORY_TRACT | Status: AC
  Administered 2014-05-24: 3 mL via RESPIRATORY_TRACT

## 2014-05-24 NOTE — Assessment & Plan Note (Signed)
Resolving.  Duoneb x 1 given in office.  Finish steroid and antibiotic course.  Follow-up if symptoms do not continue to improve.

## 2014-05-24 NOTE — Patient Instructions (Signed)
Please finish steroid and antibiotic. Your lungs are sounding better.  Continue your other medications as directed.  Follow-up as needed.

## 2014-05-24 NOTE — Progress Notes (Signed)
Pre visit review using our clinic review tool, if applicable. No additional management support is needed unless otherwise documented below in the visit note/SLS  

## 2014-05-24 NOTE — Addendum Note (Signed)
Addended by: Rockwell Germany on: 05/24/2014 05:24 PM   Modules accepted: Orders

## 2014-05-24 NOTE — Progress Notes (Signed)
Patient presents to clinic today for follow-up of COPD exacerbation with acute bronchitis.  Patient endorses she is doing much better since starting medications.  Is taking the antibiotic and the steroid as directed in addition to her chronic COPD medications.  Fever has resolved.  Denies pleuritic chest pain.  Still with some residual chest tightness but has not completed total course of steroid.   Past Medical History  Diagnosis Date  . Aortic stenosis     moderate by echo 6/11 gradient 20/38mHg for mean and peak  . TIA (transient ischemic attack)     Dr. SLeonie Man . Peripheral vascular disease   . IBS (irritable bowel syndrome)   . Diabetes mellitus     type II  . Chronic bronchitis   . COPD (chronic obstructive pulmonary disease)   . Atherosclerosis   . Depression   . Anxiety     Dr PCasimiro Needle . Renal cyst   . Anemia     iron deficiency  . Cellulitis   . Diverticular disease   . Infectious diarrhea(009.2)   . Sinusitis   . Thrush   . Abnormal liver function test   . GERD (gastroesophageal reflux disease)   . Insomnia     chronic  . Adenocarcinoma 2008    colon,s/p right hemicolectomy  . Angina   . Seizures     hx of one seizure "  . Cirrhosis of liver     hx of  . Clostridium difficile diarrhea   . Thrombocytopenia 04/01/2013  . Bursitis     spine per pt  . Macular degeneration 02/18/2014    [facility]     Current Outpatient Prescriptions on File Prior to Visit  Medication Sig Dispense Refill  . albuterol (PROAIR HFA) 108 (90 BASE) MCG/ACT inhaler Inhale 2 puffs into the lungs every 4 (four) hours as needed for wheezing or shortness of breath.      .Marland Kitchenalbuterol (PROVENTIL) (2.5 MG/3ML) 0.083% nebulizer solution Take 2.5 mg by nebulization every 4 (four) hours as needed for wheezing or shortness of breath.      .Marland Kitchenazithromycin (ZITHROMAX) 250 MG tablet Take 2 tablets on Day 1.  Then take 1 tablet daily.  6 tablet  0  . cholestyramine  (QUESTRAN) 4 G packet Take 4 g by mouth daily as needed (diarrhea).       . clonazePAM (KLONOPIN) 1 MG tablet Take 1 mg by mouth 2 (two) times daily as needed.       . clopidogrel (PLAVIX) 75 MG tablet Take 1 tablet (75 mg total) by mouth daily.  90 tablet  1  . cyanocobalamin 500 MCG tablet Take 500 mcg by mouth daily.      .Marland Kitchendesvenlafaxine (PRISTIQ) 100 MG 24 hr tablet Take 100 mg by mouth daily.       . ferrous sulfate 325 (65 FE) MG tablet Take 1 tablet (325 mg total) by mouth 2 (two) times daily with a meal.  60 tablet  2  . fluticasone (FLONASE) 50 MCG/ACT nasal spray Place 2 sprays into both nostrils daily.  16 g  2  . Fluticasone-Salmeterol (ADVAIR) 250-50 MCG/DOSE AEPB Inhale 1 puff into the lungs 2 (two) times daily.  1 each  6  . glimepiride (AMARYL) 1 MG tablet Take 1 tablet (1 mg total) by mouth daily with breakfast.  30 tablet  1  . Multiple Vitamin (MULTIVITAMIN WITH MINERALS) TABS tablet Take 1 tablet by mouth  daily.      . Multiple Vitamins-Minerals (OCUVITE PRESERVISION) TABS Take by mouth 2 (two) times daily.      . ondansetron (ZOFRAN) 4 MG tablet Take 1 tablet (4 mg total) by mouth every 6 (six) hours as needed for nausea or vomiting.  30 tablet  1  . pantoprazole (PROTONIX) 40 MG tablet Take 1 tablet (40 mg total) by mouth daily.  30 tablet  3  . phenytoin (DILANTIN) 100 MG ER capsule Take 2 capsules (200 mg total) by mouth at bedtime.  60 capsule  3  . Polyethyl Glycol-Propyl Glycol (SYSTANE) 0.4-0.3 % SOLN Place 1 drop into both eyes 2 (two) times daily as needed.      . potassium chloride (K-DUR) 10 MEQ tablet Take 10 mEq by mouth 2 (two) times daily.      . predniSONE (DELTASONE) 20 MG tablet Take 1 tablet (20 mg total) by mouth daily with breakfast.  10 tablet  0  . saccharomyces boulardii (FLORASTOR) 250 MG capsule Take 250 mg by mouth daily.      . traMADol (ULTRAM) 50 MG tablet Take 50 mg by mouth 2 (two) times daily as needed.       No current facility-administered  medications on file prior to visit.    Allergies  Allergen Reactions  . Bactrim [Sulfamethoxazole-Trimethoprim] Shortness Of Breath  . Levofloxacin Other (See Comments)    seizure  . Pneumovax [Pneumococcal Polysaccharide Vaccine] Other (See Comments)    Severe allergy-cellulitis  . Amoxicillin-Pot Clavulanate Other (See Comments)    Patient stated Drs. recommend taking Augmentin due to her PCN allergy.  . Penicillins Rash  . Bupropion   . Haldol [Haloperidol Lactate]     Had unknown reaction many yrs ago per pt  . Haloperidol   . Morphine   . Morphine And Related     Confused and agitation  . Pneumococcal Vaccine   . Pseudoephedrine Hcl   . Sudafed [Pseudoephedrine Hcl] Other (See Comments)    seizure    Family History  Problem Relation Age of Onset  . Colon cancer Paternal Grandmother   . Esophageal cancer Brother   . Heart disease Mother   . Diabetes Brother   . Hypertension Father   . Rectal cancer Neg Hx   . Stomach cancer Neg Hx     History   Social History  . Marital Status: Widowed    Spouse Name: N/A    Number of Children: 3  . Years of Education: N/A   Occupational History  . retired   .     Social History Main Topics  . Smoking status: Former Smoker -- 0.50 packs/day for 70 years    Types: Cigarettes    Quit date: 01/03/2014  . Smokeless tobacco: Never Used     Comment: Used Electronic cigarette  . Alcohol Use: No  . Drug Use: No  . Sexual Activity: No   Other Topics Concern  . None   Social History Narrative   Patient signed a Environmental consultant to allow her daughter, Dru Laurel, to have access to her medical records/information. Entered by Fleet Contras March 24,2011 @ 2:47 pm   Dailly Caffeine Use   Review of Systems - See HPI.  All other ROS are negative.  BP 128/78  Pulse 85  Temp(Src) 98.2 F (36.8 C) (Oral)  Resp 20  Ht 5' 5.5" (1.664 m)  Wt 132 lb (59.875 kg)  BMI 21.62 kg/m2  SpO2 99%  Physical Exam  Vitals  reviewed. Constitutional: She is oriented to person, place, and time and well-developed, well-nourished, and in no distress.  HENT:  Head: Normocephalic and atraumatic.  Right Ear: External ear normal.  Left Ear: External ear normal.  Nose: Nose normal.  Mouth/Throat: Oropharynx is clear and moist. No oropharyngeal exudate.  TM within normal limits bilaterally.  Eyes: Conjunctivae are normal.  Neck: Neck supple.  Cardiovascular: Normal rate, regular rhythm, normal heart sounds and intact distal pulses.   Pulmonary/Chest: Effort normal. No respiratory distress. She has wheezes. She has no rales. She exhibits no tenderness.  Lymphadenopathy:    She has no cervical adenopathy.  Neurological: She is alert and oriented to person, place, and time.  Skin: Skin is warm and dry. No rash noted.  Psychiatric: Affect normal.    Recent Results (from the past 2160 hour(s))  CBC WITH DIFFERENTIAL     Status: Abnormal   Collection Time    03/14/14 12:14 PM      Result Value Ref Range   WBC 8.7  4.0 - 10.5 K/uL   RBC 4.06  3.87 - 5.11 MIL/uL   Hemoglobin 10.5 (*) 12.0 - 15.0 g/dL   HCT 33.2 (*) 36.0 - 46.0 %   MCV 81.8  78.0 - 100.0 fL   MCH 25.9 (*) 26.0 - 34.0 pg   MCHC 31.6  30.0 - 36.0 g/dL   RDW 15.1  11.5 - 15.5 %   Platelets 131 (*) 150 - 400 K/uL   Neutrophils Relative % 69  43 - 77 %   Neutro Abs 5.9  1.7 - 7.7 K/uL   Lymphocytes Relative 19  12 - 46 %   Lymphs Abs 1.7  0.7 - 4.0 K/uL   Monocytes Relative 10  3 - 12 %   Monocytes Absolute 0.9  0.1 - 1.0 K/uL   Eosinophils Relative 2  0 - 5 %   Eosinophils Absolute 0.2  0.0 - 0.7 K/uL   Basophils Relative 0  0 - 1 %   Basophils Absolute 0.0  0.0 - 0.1 K/uL  COMPREHENSIVE METABOLIC PANEL     Status: Abnormal   Collection Time    03/14/14 12:14 PM      Result Value Ref Range   Sodium 142  137 - 147 mEq/L   Potassium 4.0  3.7 - 5.3 mEq/L   Chloride 104  96 - 112 mEq/L   CO2 26  19 - 32 mEq/L   Glucose, Bld 142 (*) 70 - 99  mg/dL   BUN 11  6 - 23 mg/dL   Creatinine, Ser 0.74  0.50 - 1.10 mg/dL   Calcium 8.4  8.4 - 10.5 mg/dL   Total Protein 6.8  6.0 - 8.3 g/dL   Albumin 2.7 (*) 3.5 - 5.2 g/dL   AST 23  0 - 37 U/L   ALT 11  0 - 35 U/L   Alkaline Phosphatase 116  39 - 117 U/L   Total Bilirubin 0.6  0.3 - 1.2 mg/dL   GFR calc non Af Amer 77 (*) >90 mL/min   GFR calc Af Amer 89 (*) >90 mL/min   Comment: (NOTE)     The eGFR has been calculated using the CKD EPI equation.     This calculation has not been validated in all clinical situations.     eGFR's persistently <90 mL/min signify possible Chronic Kidney     Disease.  LIPASE, BLOOD     Status: None   Collection Time  03/14/14 12:14 PM      Result Value Ref Range   Lipase 59  11 - 59 U/L  PRO B NATRIURETIC PEPTIDE     Status: Abnormal   Collection Time    03/14/14 12:14 PM      Result Value Ref Range   Pro B Natriuretic peptide (BNP) 604.7 (*) 0 - 450 pg/mL  I-STAT TROPOININ, ED     Status: None   Collection Time    03/14/14 12:19 PM      Result Value Ref Range   Troponin i, poc 0.01  0.00 - 0.08 ng/mL   Comment 3            Comment: Due to the release kinetics of cTnI,     a negative result within the first hours     of the onset of symptoms does not rule out     myocardial infarction with certainty.     If myocardial infarction is still suspected,     repeat the test at appropriate intervals.  I-STAT CG4 LACTIC ACID, ED     Status: None   Collection Time    03/14/14 12:24 PM      Result Value Ref Range   Lactic Acid, Venous 1.17  0.5 - 2.2 mmol/L  URINALYSIS, ROUTINE W REFLEX MICROSCOPIC     Status: Abnormal   Collection Time    03/14/14  1:12 PM      Result Value Ref Range   Color, Urine AMBER (*) YELLOW   Comment: BIOCHEMICALS MAY BE AFFECTED BY COLOR   APPearance CLEAR  CLEAR   Specific Gravity, Urine 1.020  1.005 - 1.030   pH 6.0  5.0 - 8.0   Glucose, UA NEGATIVE  NEGATIVE mg/dL   Hgb urine dipstick NEGATIVE  NEGATIVE    Bilirubin Urine NEGATIVE  NEGATIVE   Ketones, ur NEGATIVE  NEGATIVE mg/dL   Protein, ur 100 (*) NEGATIVE mg/dL   Urobilinogen, UA 0.2  0.0 - 1.0 mg/dL   Nitrite NEGATIVE  NEGATIVE   Leukocytes, UA TRACE (*) NEGATIVE  URINE MICROSCOPIC-ADD ON     Status: Abnormal   Collection Time    03/14/14  1:12 PM      Result Value Ref Range   Squamous Epithelial / LPF RARE  RARE   WBC, UA 0-2  <3 WBC/hpf   Casts HYALINE CASTS (*) NEGATIVE  BASIC METABOLIC PANEL     Status: Abnormal   Collection Time    03/15/14  4:43 AM      Result Value Ref Range   Sodium 140  137 - 147 mEq/L   Potassium 3.8  3.7 - 5.3 mEq/L   Chloride 102  96 - 112 mEq/L   CO2 26  19 - 32 mEq/L   Glucose, Bld 198 (*) 70 - 99 mg/dL   BUN 12  6 - 23 mg/dL   Creatinine, Ser 0.74  0.50 - 1.10 mg/dL   Calcium 8.6  8.4 - 10.5 mg/dL   GFR calc non Af Amer 77 (*) >90 mL/min   GFR calc Af Amer 89 (*) >90 mL/min   Comment: (NOTE)     The eGFR has been calculated using the CKD EPI equation.     This calculation has not been validated in all clinical situations.     eGFR's persistently <90 mL/min signify possible Chronic Kidney     Disease.  CBC     Status: Abnormal   Collection Time    03/15/14  4:43 AM      Result Value Ref Range   WBC 6.0  4.0 - 10.5 K/uL   RBC 3.84 (*) 3.87 - 5.11 MIL/uL   Hemoglobin 10.1 (*) 12.0 - 15.0 g/dL   HCT 31.3 (*) 36.0 - 46.0 %   MCV 81.5  78.0 - 100.0 fL   MCH 26.3  26.0 - 34.0 pg   MCHC 32.3  30.0 - 36.0 g/dL   RDW 14.9  11.5 - 15.5 %   Platelets 113 (*) 150 - 400 K/uL   Comment: WHITE COUNT CONFIRMED ON SMEAR     PLATELET COUNT CONFIRMED BY SMEAR  CBC WITH DIFFERENTIAL     Status: Abnormal   Collection Time    05/16/14  2:46 PM      Result Value Ref Range   WBC 8.9  4.0 - 10.5 K/uL   RBC 4.54  3.87 - 5.11 MIL/uL   Hemoglobin 12.0  12.0 - 15.0 g/dL   HCT 36.5  36.0 - 46.0 %   MCV 80.4  78.0 - 100.0 fL   MCH 26.4  26.0 - 34.0 pg   MCHC 32.9  30.0 - 36.0 g/dL   RDW 18.3 (*) 11.5 -  15.5 %   Platelets 164  150 - 400 K/uL   Neutrophils Relative % 71  43 - 77 %   Neutro Abs 6.3  1.7 - 7.7 K/uL   Lymphocytes Relative 17  12 - 46 %   Lymphs Abs 1.5  0.7 - 4.0 K/uL   Monocytes Relative 8  3 - 12 %   Monocytes Absolute 0.7  0.1 - 1.0 K/uL   Eosinophils Relative 3  0 - 5 %   Eosinophils Absolute 0.3  0.0 - 0.7 K/uL   Basophils Relative 1  0 - 1 %   Basophils Absolute 0.1  0.0 - 0.1 K/uL   Smear Review Criteria for review not met    COMPLETE METABOLIC PANEL WITH GFR     Status: Abnormal   Collection Time    05/16/14  2:46 PM      Result Value Ref Range   Sodium 141  135 - 145 mEq/L   Potassium 3.6  3.5 - 5.3 mEq/L   Chloride 103  96 - 112 mEq/L   CO2 26  19 - 32 mEq/L   Glucose, Bld 219 (*) 70 - 99 mg/dL   BUN 11  6 - 23 mg/dL   Creat 0.90  0.50 - 1.10 mg/dL   Total Bilirubin 0.6  0.2 - 1.2 mg/dL   Alkaline Phosphatase 117  39 - 117 U/L   AST 27  0 - 37 U/L   ALT 12  0 - 35 U/L   Total Protein 7.1  6.0 - 8.3 g/dL   Albumin 3.7  3.5 - 5.2 g/dL   Calcium 8.9  8.4 - 10.5 mg/dL   GFR, Est African American 69     GFR, Est Non African American 60     Comment:       The estimated GFR is a calculation valid for adults (>=72 years old)     that uses the CKD-EPI algorithm to adjust for age and sex. It is       not to be used for children, pregnant women, hospitalized patients,        patients on dialysis, or with rapidly changing kidney function.     According to the NKDEP, eGFR >89 is normal, 60-89 shows mild  impairment, 30-59 shows moderate impairment, 15-29 shows severe     impairment and <15 is ESRD.        CMV IGM     Status: None   Collection Time    05/16/14  2:46 PM      Result Value Ref Range   CMV IgM <8.00  <30.00 AU/mL   Comment:       Reference Range:        <30.00 AU/mL = Negative                        30.00-34.99 AU/mL = Equivocal                            >=35.00 AU/mL = Positive           Results from any one IgM assay should not be used  as a sole     determinant of a current or recent infection. Because an IgM test can     yield false positive results and low levels of IgM antibody may     persist for more than 12 months post infection, reliance on a single     test result could be misleading. If an acute infection is suspected,     consider obtaining a new specimen and submit for both IgG and IgM     testing in two or more weeks.        CYTOMEGALOVIRUS ANTIBODY, IGG     Status: Abnormal   Collection Time    05/16/14  2:46 PM      Result Value Ref Range   Cytomegalovirus Ab-IgG >10.00 (*) <0.60 U/mL   Comment:       Reference Range:          <0.60 U/mL = Negative                           0.60-0.69 U/mL = Equivocal                              >=0.70 U/mL = Positive            A positive result indicates that the patient has antibodies to CMV.     It does not differentiate between an active or past infection.     The clinical diagnosis must be interpreted in conjunction with the     clinical signs and symptoms of the patient.        SEDIMENTATION RATE     Status: Abnormal   Collection Time    05/16/14  2:46 PM      Result Value Ref Range   Sed Rate 42 (*) 0 - 22 mm/hr  CULTURE, URINE COMPREHENSIVE     Status: None   Collection Time    05/16/14  4:51 PM      Result Value Ref Range   Colony Count 20,OOO COLONIES/ML     Organism ID, Bacteria GROUP B STREP (S.AGALACTIAE) ISOLATED     Comment: Testing against S. agalactiae not routinely     performed due to predictability of     AMP/PEN/VAN susceptibility.  POCT URINALYSIS DIPSTICK     Status: None   Collection Time    05/16/14  5:01 PM      Result Value Ref Range   Color, UA Gold  Clarity, UA clear     Glucose, UA neg     Bilirubin, UA neg     Ketones, UA neg     Spec Grav, UA 1.020     Blood, UA trace     pH, UA 7.0     Protein, UA 300+++     Urobilinogen, UA 0.2     Nitrite, UA neg     Leukocytes, UA Negative    CBC WITH DIFFERENTIAL     Status:  Abnormal   Collection Time    05/21/14 11:09 AM      Result Value Ref Range   WBC 10.2  4.0 - 10.5 K/uL   RBC 4.16  3.87 - 5.11 MIL/uL   Hemoglobin 11.2 (*) 12.0 - 15.0 g/dL   HCT 33.7 (*) 36.0 - 46.0 %   MCV 81.0  78.0 - 100.0 fL   MCH 26.9  26.0 - 34.0 pg   MCHC 33.2  30.0 - 36.0 g/dL   RDW 17.6 (*) 11.5 - 15.5 %   Platelets 120 (*) 150 - 400 K/uL   Neutrophils Relative % 74  43 - 77 %   Neutro Abs 7.5  1.7 - 7.7 K/uL   Lymphocytes Relative 15  12 - 46 %   Lymphs Abs 1.5  0.7 - 4.0 K/uL   Monocytes Relative 9  3 - 12 %   Monocytes Absolute 0.9  0.1 - 1.0 K/uL   Eosinophils Relative 2  0 - 5 %   Eosinophils Absolute 0.2  0.0 - 0.7 K/uL   Basophils Relative 0  0 - 1 %   Basophils Absolute 0.0  0.0 - 0.1 K/uL   Smear Review Criteria for review not met    BASIC METABOLIC PANEL     Status: Abnormal   Collection Time    05/21/14 11:09 AM      Result Value Ref Range   Sodium 139  135 - 145 mEq/L   Potassium 4.1  3.5 - 5.3 mEq/L   Chloride 103  96 - 112 mEq/L   CO2 28  19 - 32 mEq/L   Glucose, Bld 199 (*) 70 - 99 mg/dL   BUN 10  6 - 23 mg/dL   Creat 0.74  0.50 - 1.10 mg/dL   Calcium 8.3 (*) 8.4 - 10.5 mg/dL    Assessment/Plan: Acute bronchitis Resolving.  Finish course of medication.  Follow-up as needed.  COPD exacerbation Resolving.  Duoneb x 1 given in office.  Finish steroid and antibiotic course.  Follow-up if symptoms do not continue to improve.

## 2014-05-24 NOTE — Assessment & Plan Note (Signed)
Resolving.  Finish course of medication.  Follow-up as needed.

## 2014-06-15 ENCOUNTER — Ambulatory Visit: Payer: Medicare Other | Admitting: Family Medicine

## 2014-06-15 ENCOUNTER — Telehealth: Payer: Self-pay | Admitting: *Deleted

## 2014-06-15 DIAGNOSIS — Z0289 Encounter for other administrative examinations: Secondary | ICD-10-CM

## 2014-06-15 NOTE — Telephone Encounter (Signed)
Pt did not show for appointment 06/15/2014 at 2:00 for 3 month follow up

## 2014-06-15 NOTE — Telephone Encounter (Signed)
Needs appt in next 1-2 months

## 2014-06-20 ENCOUNTER — Other Ambulatory Visit: Payer: Self-pay | Admitting: Nurse Practitioner

## 2014-06-22 ENCOUNTER — Other Ambulatory Visit: Payer: Medicare Other

## 2014-06-23 ENCOUNTER — Other Ambulatory Visit: Payer: Self-pay | Admitting: Family Medicine

## 2014-06-25 ENCOUNTER — Telehealth: Payer: Self-pay | Admitting: Family Medicine

## 2014-06-25 ENCOUNTER — Telehealth: Payer: Self-pay | Admitting: Family

## 2014-06-25 ENCOUNTER — Telehealth: Payer: Self-pay | Admitting: Critical Care Medicine

## 2014-06-25 ENCOUNTER — Other Ambulatory Visit (INDEPENDENT_AMBULATORY_CARE_PROVIDER_SITE_OTHER): Payer: Medicare Other

## 2014-06-25 DIAGNOSIS — Z789 Other specified health status: Secondary | ICD-10-CM

## 2014-06-25 DIAGNOSIS — D649 Anemia, unspecified: Secondary | ICD-10-CM

## 2014-06-25 LAB — FECAL OCCULT BLOOD, IMMUNOCHEMICAL: FECAL OCCULT BLD: POSITIVE — AB

## 2014-06-25 NOTE — Telephone Encounter (Signed)
Please contact pt and let her know that her stool is + for blood.  Recommend that she schedule follow up with cody for re-evaluation of her anemia.

## 2014-06-25 NOTE — Telephone Encounter (Signed)
Received call from [facility]  They received an I-FOP (hemacult) kit from pt.  However, there are no orders in epic for this.  ?ing if Dr. Joya Gaskins ordered this.  I do not see orders in epic either and do not see a phone msg or anything mentioned about this in last OV note.  Called, spoke with pt who reports kit was given to her by Elyn Aquas, PA.  Pt unsure when kit was given but reports it was given to r/o causes of her anemia.  Pt reports she took kit back to the lab in the [place] sometime last wk as she was instructed.    Spoke with [facility] in [place].  Advised of above per pt.  She verbalized understanding.  States they will call PCP office for order.  Advised to call our office back if trouble obtaining this.  She verbalized understanding and voiced no further questions or concerns at this time.

## 2014-06-25 NOTE — Telephone Encounter (Signed)
Pt is needing a new order for ifob, states last order has expired that [place] had ordered.

## 2014-06-25 NOTE — Telephone Encounter (Signed)
Attempt to reach pt via phone, no answer, no answering machine/SLS

## 2014-06-25 NOTE — Telephone Encounter (Signed)
New order placed for Ifob.//AB/CMA

## 2014-06-26 ENCOUNTER — Telehealth: Payer: Self-pay

## 2014-06-26 NOTE — Telephone Encounter (Signed)
Message copied by Varney Daily on Tue Jun 26, 2014  6:46 AM ------      Message from: Penni Homans A      Created: Mon Jun 25, 2014  6:40 PM       Please let her know her IFOB was positive we should have her proceed with cologuard. Dr Etter Sjogren has ordered  Before so ask how they do it and also she needs to do CBC and iron studies. Schedule her an appt to see [place] fairly soon to discuss.      ----- Message -----         From: Brunetta Jeans, PA-C         Sent: 06/25/2014   5:21 PM           To: Debbrah Alar, NP, Mosie Lukes, MD            Thank you for making me aware. I will be glad to see patient and I will also forward this to Dr. Charlett Blake (PCP) just so she is aware because I am out of office this week.             FYI Dr. Charlett Blake      ----- Message -----         From: Debbrah Alar, NP         Sent: 06/25/2014   4:33 PM           To: Brunetta Jeans, PA-C            I see + IFOB, but not new, + hx of portal gastropathy.  I will ask them to come see you for follow up and cbc.             ------

## 2014-06-26 NOTE — Telephone Encounter (Signed)
Pt could not come in on Fri, 09.25.15 Monique Fleming returns from [place] [facility]. [facility]/PCP has opening]; offered appt with Mackie Pai, PA, as pt has Hx of Colon cancer as well. Pt understood & agreed; has appt scheduled with Percell Miller for Tues, 09.23.15 at 1:45p/SLS FYI: to provider. Thanks

## 2014-06-27 ENCOUNTER — Encounter: Payer: Self-pay | Admitting: Medical

## 2014-06-27 ENCOUNTER — Ambulatory Visit (INDEPENDENT_AMBULATORY_CARE_PROVIDER_SITE_OTHER): Payer: Medicare Other | Admitting: Medical

## 2014-06-27 VITALS — BP 145/80 | HR 92 | Temp 98.6°F

## 2014-06-27 DIAGNOSIS — D649 Anemia, unspecified: Secondary | ICD-10-CM

## 2014-06-27 DIAGNOSIS — I6529 Occlusion and stenosis of unspecified carotid artery: Secondary | ICD-10-CM

## 2014-06-27 DIAGNOSIS — Z85038 Personal history of other malignant neoplasm of large intestine: Secondary | ICD-10-CM

## 2014-06-27 DIAGNOSIS — K921 Melena: Secondary | ICD-10-CM

## 2014-06-27 LAB — CBC WITH DIFFERENTIAL/PLATELET
BASOS PCT: 0.3 % (ref 0.0–3.0)
Basophils Absolute: 0 10*3/uL (ref 0.0–0.1)
EOS PCT: 2.7 % (ref 0.0–5.0)
Eosinophils Absolute: 0.2 10*3/uL (ref 0.0–0.7)
HEMATOCRIT: 34.4 % — AB (ref 36.0–46.0)
HEMOGLOBIN: 11.5 g/dL — AB (ref 12.0–15.0)
Lymphocytes Relative: 20.2 % (ref 12.0–46.0)
Lymphs Abs: 1.2 10*3/uL (ref 0.7–4.0)
MCHC: 33.3 g/dL (ref 30.0–36.0)
MCV: 84.1 fl (ref 78.0–100.0)
MONOS PCT: 8.8 % (ref 3.0–12.0)
Monocytes Absolute: 0.5 10*3/uL (ref 0.1–1.0)
NEUTROS ABS: 4 10*3/uL (ref 1.4–7.7)
Neutrophils Relative %: 68 % (ref 43.0–77.0)
Platelets: 126 10*3/uL — ABNORMAL LOW (ref 150.0–400.0)
RBC: 4.09 Mil/uL (ref 3.87–5.11)
RDW: 17.3 % — ABNORMAL HIGH (ref 11.5–15.5)
WBC: 5.8 10*3/uL (ref 4.0–10.5)

## 2014-06-27 NOTE — Progress Notes (Signed)
   Subjective:    Patient ID: Monique Fleming, female    DOB: 1928-10-16, 80 y.o.   MRN: [id number]  HPI  Pt in for follow up. She was found to have positive blood in her stools.  Last hb was 11.2 and hct was 33.7. Pt has history of colon cancer. Pt GI is Dr. Henrene Pastor. Pt tells me she had some black stools and then test was done. Pt states fatigue but this is not new. She states also has history of copd. No bruising easily.  Pt does states stomach upset. She has reflux. She is on protonix and zofran. Currently her stomach pain  is controlled. No nausea or vomiting. No back pain. No chest pain.   Review of Systems  Constitutional: Negative for fever, chills and fatigue.       Chronic fatigue no more than usual per pt.   HENT: Negative.   Respiratory: Negative for cough, chest tightness, wheezing and stridor.   Cardiovascular: Negative for chest pain and palpitations.  Gastrointestinal: Negative for nausea, vomiting, abdominal pain, diarrhea, constipation, blood in stool, abdominal distention, anal bleeding and rectal pain.       None now but some pain this am but transient and resolved.  Genitourinary: Negative.   Musculoskeletal: Negative for back pain.  Neurological: Negative for dizziness, syncope, speech difficulty, light-headedness, numbness and headaches.  Hematological: Negative for adenopathy. Does not bruise/bleed easily.       Objective:   Physical Exam  Constitutional: She is oriented to person, place, and time. She appears well-nourished.  Frail appearing but alert. No acute distress  HENT:  Head: Normocephalic and atraumatic.  Eyes: Conjunctivae are normal. Pupils are equal, round, and reactive to light.  Neck: Normal range of motion. Neck supple. No JVD present. No thyromegaly present.  Cardiovascular: Normal rate, regular rhythm and normal heart sounds.   Pulmonary/Chest: Effort normal and breath sounds normal. No stridor. No respiratory distress. She has no wheezes. She  has no rales. She exhibits no tenderness.  Abdominal: Soft. Bowel sounds are normal. She exhibits no distension and no mass. There is no tenderness. There is no rebound and no guarding.  Neurological: She is alert and oriented to person, place, and time. No cranial nerve deficit.  Psychiatric: She has a normal mood and affect. Her behavior is normal. Judgment and thought content normal.          Assessment & Plan:

## 2014-06-27 NOTE — Telephone Encounter (Signed)
Patient has a scheduled appt to day with Mackie Pai for follow up blood work.   Will give cologuard information to PA.

## 2014-06-27 NOTE — Patient Instructions (Addendum)
We need to check and see if your anemia level is stable. I will send labs out stat.   I will refer you back to your GI MD since your test were positive for blood in stools.  If your blood volume/hb-hct dropping you may need transfusion.   Follow up in 10 days or as needed. Please make sure lab has your number that they will call you with the results.

## 2014-06-27 NOTE — Assessment & Plan Note (Signed)
Some blood in her stools and recent anemia. Check today and repeat stool cards for blood. Pt states she was told need to repeat cards. Follow and if dropping hb/hct significant then transfusion. Will go ahead and refer to GI since hx of colon ca.

## 2014-06-29 ENCOUNTER — Emergency Department (HOSPITAL_COMMUNITY)
Admission: EM | Admit: 2014-06-29 | Discharge: 2014-06-29 | Disposition: A | Discharge status: Home / Self Care | Payer: Medicare Other | Attending: Emergency Medicine | Admitting: Emergency Medicine

## 2014-06-29 ENCOUNTER — Emergency Department (HOSPITAL_COMMUNITY): Payer: Medicare Other

## 2014-06-29 ENCOUNTER — Ambulatory Visit: Payer: Medicare Other | Admitting: Critical Care Medicine

## 2014-06-29 ENCOUNTER — Encounter (HOSPITAL_COMMUNITY): Payer: Self-pay | Admitting: Emergency Medicine

## 2014-06-29 ENCOUNTER — Telehealth: Payer: Self-pay | Admitting: Internal Medicine

## 2014-06-29 DIAGNOSIS — R509 Fever, unspecified: Secondary | ICD-10-CM | POA: Insufficient documentation

## 2014-06-29 DIAGNOSIS — Z8673 Personal history of transient ischemic attack (TIA), and cerebral infarction without residual deficits: Secondary | ICD-10-CM | POA: Insufficient documentation

## 2014-06-29 DIAGNOSIS — K921 Melena: Secondary | ICD-10-CM | POA: Insufficient documentation

## 2014-06-29 DIAGNOSIS — K219 Gastro-esophageal reflux disease without esophagitis: Secondary | ICD-10-CM | POA: Insufficient documentation

## 2014-06-29 DIAGNOSIS — Z9889 Other specified postprocedural states: Secondary | ICD-10-CM | POA: Diagnosis not present

## 2014-06-29 DIAGNOSIS — F3289 Other specified depressive episodes: Secondary | ICD-10-CM | POA: Insufficient documentation

## 2014-06-29 DIAGNOSIS — Z7902 Long term (current) use of antithrombotics/antiplatelets: Secondary | ICD-10-CM | POA: Diagnosis not present

## 2014-06-29 DIAGNOSIS — F411 Generalized anxiety disorder: Secondary | ICD-10-CM | POA: Diagnosis not present

## 2014-06-29 DIAGNOSIS — Z85038 Personal history of other malignant neoplasm of large intestine: Secondary | ICD-10-CM | POA: Diagnosis not present

## 2014-06-29 DIAGNOSIS — Z880 Allergy status to penicillin: Secondary | ICD-10-CM | POA: Diagnosis not present

## 2014-06-29 DIAGNOSIS — D649 Anemia, unspecified: Secondary | ICD-10-CM | POA: Diagnosis not present

## 2014-06-29 DIAGNOSIS — K625 Hemorrhage of anus and rectum: Secondary | ICD-10-CM

## 2014-06-29 DIAGNOSIS — Z872 Personal history of diseases of the skin and subcutaneous tissue: Secondary | ICD-10-CM | POA: Insufficient documentation

## 2014-06-29 DIAGNOSIS — I209 Angina pectoris, unspecified: Secondary | ICD-10-CM | POA: Diagnosis not present

## 2014-06-29 DIAGNOSIS — G40909 Epilepsy, unspecified, not intractable, without status epilepticus: Secondary | ICD-10-CM | POA: Insufficient documentation

## 2014-06-29 DIAGNOSIS — J449 Chronic obstructive pulmonary disease, unspecified: Secondary | ICD-10-CM | POA: Insufficient documentation

## 2014-06-29 DIAGNOSIS — Z87448 Personal history of other diseases of urinary system: Secondary | ICD-10-CM | POA: Diagnosis not present

## 2014-06-29 DIAGNOSIS — Z8619 Personal history of other infectious and parasitic diseases: Secondary | ICD-10-CM | POA: Insufficient documentation

## 2014-06-29 DIAGNOSIS — IMO0002 Reserved for concepts with insufficient information to code with codable children: Secondary | ICD-10-CM | POA: Diagnosis not present

## 2014-06-29 DIAGNOSIS — Z79899 Other long term (current) drug therapy: Secondary | ICD-10-CM | POA: Diagnosis not present

## 2014-06-29 DIAGNOSIS — F329 Major depressive disorder, single episode, unspecified: Secondary | ICD-10-CM | POA: Insufficient documentation

## 2014-06-29 DIAGNOSIS — F17200 Nicotine dependence, unspecified, uncomplicated: Secondary | ICD-10-CM | POA: Diagnosis not present

## 2014-06-29 DIAGNOSIS — E119 Type 2 diabetes mellitus without complications: Secondary | ICD-10-CM | POA: Diagnosis not present

## 2014-06-29 DIAGNOSIS — Z9089 Acquired absence of other organs: Secondary | ICD-10-CM | POA: Insufficient documentation

## 2014-06-29 DIAGNOSIS — N898 Other specified noninflammatory disorders of vagina: Secondary | ICD-10-CM | POA: Diagnosis present

## 2014-06-29 DIAGNOSIS — J4489 Other specified chronic obstructive pulmonary disease: Secondary | ICD-10-CM | POA: Insufficient documentation

## 2014-06-29 LAB — BASIC METABOLIC PANEL
ANION GAP: 15 (ref 5–15)
BUN: 10 mg/dL (ref 6–23)
CO2: 25 meq/L (ref 19–32)
Calcium: 8.4 mg/dL (ref 8.4–10.5)
Chloride: 102 mEq/L (ref 96–112)
Creatinine, Ser: 0.69 mg/dL (ref 0.50–1.10)
GFR calc non Af Amer: 79 mL/min — ABNORMAL LOW (ref >90)
Glucose, Bld: 151 mg/dL — ABNORMAL HIGH (ref 70–99)
POTASSIUM: 3.5 meq/L — AB (ref 3.7–5.3)
Sodium: 142 mEq/L (ref 137–147)

## 2014-06-29 LAB — POC OCCULT BLOOD, ED: Fecal Occult Bld: POSITIVE — AB

## 2014-06-29 LAB — CBC WITH DIFFERENTIAL/PLATELET
Basophils Absolute: 0 10*3/uL (ref 0.0–0.1)
Basophils Relative: 0 % (ref 0–1)
EOS PCT: 3 % (ref 0–5)
Eosinophils Absolute: 0.2 10*3/uL (ref 0.0–0.7)
HCT: 36.9 % (ref 36.0–46.0)
Hemoglobin: 12 g/dL (ref 12.0–15.0)
LYMPHS PCT: 19 % (ref 12–46)
Lymphs Abs: 1.5 10*3/uL (ref 0.7–4.0)
MCH: 27.5 pg (ref 26.0–34.0)
MCHC: 32.5 g/dL (ref 30.0–36.0)
MCV: 84.6 fL (ref 78.0–100.0)
MONOS PCT: 9 % (ref 3–12)
Monocytes Absolute: 0.7 10*3/uL (ref 0.1–1.0)
Neutro Abs: 5.5 10*3/uL (ref 1.7–7.7)
Neutrophils Relative %: 69 % (ref 43–77)
PLATELETS: 113 10*3/uL — AB (ref 150–400)
RBC: 4.36 MIL/uL (ref 3.87–5.11)
RDW: 16 % — ABNORMAL HIGH (ref 11.5–15.5)
WBC: 7.9 10*3/uL (ref 4.0–10.5)

## 2014-06-29 LAB — URINALYSIS, ROUTINE W REFLEX MICROSCOPIC
Bilirubin Urine: NEGATIVE
Glucose, UA: NEGATIVE mg/dL
Hgb urine dipstick: NEGATIVE
Ketones, ur: NEGATIVE mg/dL
LEUKOCYTES UA: NEGATIVE
Nitrite: NEGATIVE
PH: 7 (ref 5.0–8.0)
Protein, ur: 30 mg/dL — AB
Specific Gravity, Urine: 1.008 (ref 1.005–1.030)
UROBILINOGEN UA: 0.2 mg/dL (ref 0.0–1.0)

## 2014-06-29 LAB — TYPE AND SCREEN
ABO/RH(D): O POS
Antibody Screen: NEGATIVE

## 2014-06-29 LAB — URINE MICROSCOPIC-ADD ON: Urine-Other: NONE SEEN

## 2014-06-29 LAB — I-STAT CG4 LACTIC ACID, ED: LACTIC ACID, VENOUS: 1.8 mmol/L (ref 0.5–2.2)

## 2014-06-29 MED ORDER — ONDANSETRON HCL 4 MG/2ML IJ SOLN
4.0000 mg | Freq: Once | INTRAMUSCULAR | Status: AC
  Administered 2014-06-29: 4 mg via INTRAVENOUS
  Filled 2014-06-29: qty 2

## 2014-06-29 MED ORDER — IOHEXOL 300 MG/ML  SOLN
100.0000 mL | Freq: Once | INTRAMUSCULAR | Status: AC | PRN
  Administered 2014-06-29: 100 mL via INTRAVENOUS

## 2014-06-29 MED ORDER — ALBUTEROL SULFATE HFA 108 (90 BASE) MCG/ACT IN AERS
1.0000 | INHALATION_SPRAY | RESPIRATORY_TRACT | Status: DC | PRN
  Administered 2014-06-29: 2 via RESPIRATORY_TRACT
  Filled 2014-06-29: qty 6.7

## 2014-06-29 MED ORDER — IOHEXOL 300 MG/ML  SOLN
50.0000 mL | Freq: Once | INTRAMUSCULAR | Status: AC | PRN
  Administered 2014-06-29: 50 mL via ORAL

## 2014-06-29 NOTE — Discharge Instructions (Signed)
Hemorrhoids °Hemorrhoids are swollen veins around the rectum or anus. There are two types of hemorrhoids:  °· Internal hemorrhoids. These occur in the veins just inside the rectum. They may poke through to the outside and become irritated and painful. °· External hemorrhoids. These occur in the veins outside the anus and can be felt as a painful swelling or hard lump near the anus. °CAUSES °· Pregnancy.   °· Obesity.   °· Constipation or diarrhea.   °· Straining to have a bowel movement.   °· Sitting for long periods on the toilet. °· Heavy lifting or other activity that caused you to strain. °· Anal intercourse. °SYMPTOMS  °· Pain.   °· Anal itching or irritation.   °· Rectal bleeding.   °· Fecal leakage.   °· Anal swelling.   °· One or more lumps around the anus.   °DIAGNOSIS  °Your caregiver may be able to diagnose hemorrhoids by visual examination. Other examinations or tests that may be performed include:  °· Examination of the rectal area with a gloved hand (digital rectal exam).   °· Examination of anal canal using a small tube (scope).   °· A blood test if you have lost a significant amount of blood. °· A test to look inside the colon (sigmoidoscopy or colonoscopy). °TREATMENT °Most hemorrhoids can be treated at home. However, if symptoms do not seem to be getting better or if you have a lot of rectal bleeding, your caregiver may perform a procedure to help make the hemorrhoids get smaller or remove them completely. Possible treatments include:  °· Placing a rubber band at the base of the hemorrhoid to cut off the circulation (rubber band ligation).   °· Injecting a chemical to shrink the hemorrhoid (sclerotherapy).   °· Using a tool to burn the hemorrhoid (infrared light therapy).   °· Surgically removing the hemorrhoid (hemorrhoidectomy).   °· Stapling the hemorrhoid to block blood flow to the tissue (hemorrhoid stapling).   °HOME CARE INSTRUCTIONS  °· Eat foods with fiber, such as whole grains, beans,  nuts, fruits, and vegetables. Ask your doctor about taking products with added fiber in them (fiber supplements). °· Increase fluid intake. Drink enough water and fluids to keep your urine clear or pale yellow.   °· Exercise regularly.   °· Go to the bathroom when you have the urge to have a bowel movement. Do not wait.   °· Avoid straining to have bowel movements.   °· Keep the anal area dry and clean. Use wet toilet paper or moist towelettes after a bowel movement.   °· Medicated creams and suppositories may be used or applied as directed.   °· Only take over-the-counter or prescription medicines as directed by your caregiver.   °· Take warm sitz baths for 15-20 minutes, 3-4 times a day to ease pain and discomfort.   °· Place ice packs on the hemorrhoids if they are tender and swollen. Using ice packs between sitz baths may be helpful.   °¨ Put ice in a plastic bag.   °¨ Place a towel between your skin and the bag.   °¨ Leave the ice on for 15-20 minutes, 3-4 times a day.   °· Do not use a donut-shaped pillow or sit on the toilet for long periods. This increases blood pooling and pain.   °SEEK MEDICAL CARE IF: °· You have increasing pain and swelling that is not controlled by treatment or medicine. °· You have uncontrolled bleeding. °· You have difficulty or you are unable to have a bowel movement. °· You have pain or inflammation outside the area of the hemorrhoids. °MAKE SURE YOU: °· Understand these instructions. °·   Will watch your condition.  Will get help right away if you are not doing well or get worse. Document Released: 09/18/2000 Document Revised: 09/07/2012 Document Reviewed: 07/26/2012 [facility] Patient Information 2015 [place], [place]. This information is not intended to replace advice given to you by your health care provider. Make sure you discuss any questions you have with your health care provider. Rectal Bleeding Rectal bleeding is when blood passes out of the anus. It is usually a sign  that something is wrong. It may not be serious, but it should always be evaluated. Rectal bleeding may present as bright red blood or extremely dark stools. The color may range from dark red or maroon to black (like tar). It is important that the cause of rectal bleeding be identified so treatment can be started and the problem corrected. CAUSES   Hemorrhoids. These are enlarged (dilated) blood vessels or veins in the anal or rectal area.  Fistulas. Theseare abnormal, burrowing channels that usually run from inside the rectum to the skin around the anus. They can bleed.  Anal fissures. This is a tear in the tissue of the anus. Bleeding occurs with bowel movements.  Diverticulosis. This is a condition in which pockets or sacs project from the bowel wall. Occasionally, the sacs can bleed.  Diverticulitis. Thisis an infection involving diverticulosis of the colon.  Proctitis and colitis. These are conditions in which the rectum, colon, or both, can become inflamed and pitted (ulcerated).  Polyps and cancer. Polyps are non-cancerous (benign) growths in the colon that may bleed. Certain types of polyps turn into cancer.  Protrusion of the rectum. Part of the rectum can project from the anus and bleed.  Certain medicines.  Intestinal infections.  Blood vessel abnormalities. HOME CARE INSTRUCTIONS  Eat a high-fiber diet to keep your stool soft.  Limit activity.  Drink enough fluids to keep your urine clear or pale yellow.  Warm baths may be useful to soothe rectal pain.  Follow up with your caregiver as directed. SEEK IMMEDIATE MEDICAL CARE IF:  You develop increased bleeding.  You have black or dark red stools.  You vomit blood or material that looks like coffee grounds.  You have abdominal pain or tenderness.  You have a fever.  You feel weak, nauseous, or you faint.  You have severe rectal pain or you are unable to have a bowel movement. MAKE SURE YOU:  Understand  these instructions.  Will watch your condition.  Will get help right away if you are not doing well or get worse. Document Released: 03/13/2002 Document Revised: 12/14/2011 Document Reviewed: 03/08/2011 [facility] Patient Information 2015 [place], [place]. This information is not intended to replace advice given to you by your health care provider. Make sure you discuss any questions you have with your health care provider.

## 2014-06-29 NOTE — ED Notes (Signed)
Pt c/o vaginal bleeding about an hour ago. Pt states she had gone to [place] this am and when she wiped herself noticed blood on tissue. Pt has been having rectal bleeding. Pt has been seen by GI and was told she has some black stools and her hgb and hct was low per GI. Upon ems arrival they did not note any blood in bm. Pt thinks she is having vaginal bleeding and does not think this is GI related. Pt has not had hesterectomy.

## 2014-06-29 NOTE — ED Provider Notes (Signed)
CSN: [id number]     Arrival date & time 06/29/14  0049 History   First MD Initiated Contact with Patient 06/29/14 0050     Chief Complaint  Patient presents with  . Vaginal Bleeding     (Consider location/radiation/quality/duration/timing/severity/associated sxs/prior Treatment) Patient is a 80 y.o. female presenting with vaginal bleeding. The history is provided by the patient. No language interpreter was used.  Vaginal Bleeding Quality:  Unable to specify Severity:  Unable to specify Timing:  Unable to specify Progression:  Unable to specify Chronicity:  New Menstrual history:  Postmenopausal Possible pregnancy: no   Context: spontaneously   Relieved by:  Nothing Worsened by:  Nothing tried Ineffective treatments:  None tried Associated symptoms: abdominal pain and fever (chronic subjective fever/chills)   Associated symptoms: no back pain, no dizziness, no dyspareunia, no dysuria, no fatigue, no nausea and no vaginal discharge   Abdominal pain:    Location:  Suprapubic   Quality:  Dull and aching   Severity:  Moderate   Duration: 3-4 months, daily, about 30 mins at a time.   Timing:  Intermittent   Progression:  Waxing and waning   Chronicity:  New Risk factors comment:  Hx of internal & external hemorrhoids, colon cancer and GI bleeding   Past Medical History  Diagnosis Date  . Aortic stenosis     moderate by echo 6/11 gradient 20/71mmHg for mean and peak  . TIA (transient ischemic attack)     Dr. [name]  . Peripheral vascular disease   . IBS (irritable bowel syndrome)   . Diabetes mellitus     type II  . Chronic bronchitis   . COPD (chronic obstructive pulmonary disease)   . Atherosclerosis   . Depression   . Anxiety     Dr Casimiro Needle  . Renal cyst   . Anemia     iron deficiency  . Cellulitis   . Diverticular disease   . Infectious diarrhea(009.2)   . Sinusitis   . Thrush   . Abnormal liver function test   . GERD (gastroesophageal reflux disease)   .  Insomnia     chronic  . Adenocarcinoma 2008    colon,s/p right hemicolectomy  . Angina   . Seizures     hx of one seizure "  . Cirrhosis of liver     hx of  . Clostridium difficile diarrhea   . Thrombocytopenia 04/01/2013  . Bursitis     spine per pt  . Macular degeneration 02/18/2014    Follows with [facility]  . Internal hemorrhoids    Past Surgical History  Procedure Laterality Date  . Carotid endarterectomy    . Colectomy      right hemi  . Cholecystectomy  11/09  . Upper gastrointestinal endoscopy    . Tonsillectomy    . Appendectomy     Family History  Problem Relation Age of Onset  . Colon cancer Paternal Grandmother   . Esophageal cancer Brother   . Heart disease Mother   . Diabetes Brother   . Hypertension Father   . Rectal cancer Neg Hx   . Stomach cancer Neg Hx    History  Substance Use Topics  . Smoking status: Current Some Day Smoker -- 0.50 packs/day for 70 years    Types: Cigarettes    Last Attempt to Quit: 01/03/2014  . Smokeless tobacco: Never Used     Comment: Used Electronic cigarette  . Alcohol Use: No   OB History  Grav Para Term Preterm Abortions TAB SAB Ect Mult Living                 Review of Systems  Constitutional: Positive for fever (chronic subjective fever/chills). Negative for chills, diaphoresis, activity change, appetite change and fatigue.  HENT: Negative for congestion, facial swelling, rhinorrhea and sore throat.   Eyes: Negative for photophobia and discharge.  Respiratory: Negative for cough, chest tightness and shortness of breath.   Cardiovascular: Negative for chest pain, palpitations and leg swelling.  Gastrointestinal: Positive for abdominal pain, diarrhea and blood in stool. Negative for nausea and vomiting.  Endocrine: Negative for polydipsia and polyuria.  Genitourinary: Positive for vaginal bleeding. Negative for dysuria, frequency, vaginal discharge, difficulty urinating, pelvic pain and dyspareunia.   Musculoskeletal: Negative for arthralgias, back pain, neck pain and neck stiffness.  Skin: Negative for color change and wound.  Allergic/Immunologic: Negative for immunocompromised state.  Neurological: Negative for dizziness, facial asymmetry, weakness, numbness and headaches.  Hematological: Does not bruise/bleed easily.  Psychiatric/Behavioral: Negative for confusion and agitation.      Allergies  Bactrim; Levofloxacin; Pneumovax; Amoxicillin-pot clavulanate; Penicillins; Bupropion; Haldol; Haloperidol; Morphine; Morphine and related; Pneumococcal vaccine; Pseudoephedrine hcl; and Sudafed  Home Medications   Prior to Admission medications   Medication Sig Start Date End Date Taking? Authorizing Provider  albuterol (PROAIR HFA) 108 (90 BASE) MCG/ACT inhaler Inhale 2 puffs into the lungs every 4 (four) hours as needed for wheezing or shortness of breath.   Yes Historical Provider, MD  albuterol (PROVENTIL) (2.5 MG/3ML) 0.083% nebulizer solution Take 2.5 mg by nebulization every 4 (four) hours as needed for wheezing or shortness of breath. 03/22/14  Yes Mosie Lukes, MD  cholestyramine Lucrezia Starch) 4 G packet Take 4 g by mouth daily as needed (diarrhea).    Yes Historical Provider, MD  clonazePAM (KLONOPIN) 1 MG tablet Take 1 mg by mouth 2 (two) times daily as needed for anxiety.    Yes Historical Provider, MD  clopidogrel (PLAVIX) 75 MG tablet Take 1 tablet (75 mg total) by mouth daily. 08/03/13  Yes Mosie Lukes, MD  cyanocobalamin 500 MCG tablet Take 500 mcg by mouth daily.   Yes Historical Provider, MD  desvenlafaxine (PRISTIQ) 100 MG 24 hr tablet Take 100 mg by mouth daily.  04/03/14  Yes Historical Provider, MD  ferrous sulfate 325 (65 FE) MG tablet Take 1 tablet (325 mg total) by mouth 2 (two) times daily with a meal. 11/13/13  Yes Debbrah Alar, NP  fluticasone (FLONASE) 50 MCG/ACT nasal spray Place 2 sprays into both nostrils daily. 05/04/14  Yes Elsie Stain, MD   Fluticasone-Salmeterol (ADVAIR) 250-50 MCG/DOSE AEPB Inhale 1 puff into the lungs 2 (two) times daily. 04/05/14  Yes Elsie Stain, MD  glimepiride (AMARYL) 1 MG tablet Take 1 mg by mouth daily as needed (according to blood sugar readings above 120).   Yes Historical Provider, MD  Multiple Vitamin (MULTIVITAMIN WITH MINERALS) TABS tablet Take 1 tablet by mouth daily.   Yes Historical Provider, MD  Multiple Vitamins-Minerals (OCUVITE PRESERVISION) TABS Take by mouth 2 (two) times daily.   Yes Historical Provider, MD  ondansetron (ZOFRAN) 4 MG tablet Take 1 tablet (4 mg total) by mouth every 6 (six) hours as needed for nausea or vomiting. 01/23/14  Yes Willia Craze, NP  pantoprazole (PROTONIX) 40 MG tablet Take 40 mg by mouth daily.   Yes Historical Provider, MD  phenytoin (DILANTIN) 100 MG ER capsule Take 200 mg by mouth  at bedtime.   Yes Historical Provider, MD  Polyethyl Glycol-Propyl Glycol (SYSTANE) 0.4-0.3 % SOLN Place 1 drop into both eyes 2 (two) times daily as needed.   Yes Historical Provider, MD  potassium chloride (K-DUR) 10 MEQ tablet Take 10 mEq by mouth 2 (two) times daily.   Yes Historical Provider, MD  saccharomyces boulardii (FLORASTOR) 250 MG capsule Take 250 mg by mouth daily.   Yes Historical Provider, MD  traMADol (ULTRAM) 50 MG tablet Take 50 mg by mouth 2 (two) times daily as needed for moderate pain.    Yes Historical Provider, MD   BP 151/59  Pulse 95  Temp(Src) 98.6 F (37 C) (Oral)  Resp 18  SpO2 98% Physical Exam  Constitutional: She is oriented to person, place, and time. She appears well-developed and well-nourished. No distress.  HENT:  Head: Normocephalic and atraumatic.  Mouth/Throat: No oropharyngeal exudate.  Eyes: Pupils are equal, round, and reactive to light.  Neck: Normal range of motion. Neck supple.  Cardiovascular: Normal rate, regular rhythm and normal heart sounds.  Exam reveals no gallop and no friction rub.   No murmur  heard. Pulmonary/Chest: Effort normal and breath sounds normal. No respiratory distress. She has no wheezes. She has no rales.  Abdominal: Soft. Bowel sounds are normal. She exhibits no distension and no mass. There is tenderness in the suprapubic area. There is no rigidity, no rebound and no guarding.  Genitourinary: Vagina normal. Guaiac positive stool.  Scant black stool on rectal. No visualized blood on rectal exam.   Musculoskeletal: Normal range of motion. She exhibits no edema and no tenderness.  Neurological: She is alert and oriented to person, place, and time.  Skin: Skin is warm and dry.  Psychiatric: She has a normal mood and affect.    ED Course  Procedures (including critical care time) Labs Review Labs Reviewed  CBC WITH DIFFERENTIAL - Abnormal; Notable for the following:    RDW 16.0 (*)    Platelets 113 (*)    All other components within normal limits  BASIC METABOLIC PANEL - Abnormal; Notable for the following:    Potassium 3.5 (*)    Glucose, Bld 151 (*)    GFR calc non Af Amer 79 (*)    All other components within normal limits  URINALYSIS, ROUTINE W REFLEX MICROSCOPIC - Abnormal; Notable for the following:    Protein, ur 30 (*)    All other components within normal limits  POC OCCULT BLOOD, ED - Abnormal; Notable for the following:    Fecal Occult Bld POSITIVE (*)    All other components within normal limits  URINE CULTURE  URINE MICROSCOPIC-ADD ON  I-STAT CG4 LACTIC ACID, ED  TYPE AND SCREEN    Imaging Review Ct Abdomen Pelvis W Contrast  06/29/2014   CLINICAL DATA:  Suprapubic pain. Vaginal and rectal bleeding. History of colon cancer, cholecystectomy, appendectomy, cirrhosis.  EXAM: CT ABDOMEN AND PELVIS WITH CONTRAST  TECHNIQUE: Multidetector CT imaging of the abdomen and pelvis was performed using the standard protocol following bolus administration of intravenous contrast.  CONTRAST:  155mL OMNIPAQUE IOHEXOL 300 MG/ML  SOLN  COMPARISON:  CT of the  abdomen and pelvis March 14, 2014  FINDINGS: LUNG BASES: Included view of the lung bases are clear. Visualized heart and pericardium are unremarkable. Irregular intimal thickening of the included thoracic aorta.  SOLID ORGANS: The liver is nodular, diffusely hypodense, stable from prior imaging. No intrahepatic mass or biliary dilatation. Portal vein appears patent. Stable splenomegaly.  Status post cholecystectomy. Pancreas, adrenal glands are not acute.  GASTROINTESTINAL TRACT: Tiny hiatal hernia. Stomach is decompressed. Small bowel are normal in course and caliber, status post RIGHT hemicolectomy. Mild thickened appearance of the rectum could reflect internal hemorrhoids.  KIDNEYS/ URINARY TRACT: Kidneys are orthotopic, demonstrating symmetric enhancement. No nephrolithiasis, hydronephrosis or solid renal masses. Bilateral renal cysts, measuring up to 6.9 cm on the LEFT upper pole, unchanged. Too small to characterize hypodensities in the kidneys bilaterally. The unopacified ureters are normal in course and caliber. Delayed imaging through the kidneys demonstrates symmetric prompt contrast excretion within the proximal urinary collecting system. Urinary bladder is well distended and unremarkable.  PERITONEUM/RETROPERITONEUM: Varicosities. No intraperitoneal free fluid nor free air. Aortoiliac vessels are normal in course and intermittently ectatic without aneurysm, severe calcific atherosclerosis. No lymphadenopathy by CT size criteria. Internal reproductive organs are unremarkable for age.  SOFT TISSUE/OSSEOUS STRUCTURES: Nonsuspicious. Osteopenia. Severe L5-S1 disc degeneration resulting in moderate neural foraminal narrowing. Anterior abdominal wall scarring.  IMPRESSION: No acute intra-abdominal or pelvic process.  Mildly thickened appearance of the rectum may reflect internal hemorrhoids, status post RIGHT hemicolectomy. For history of vaginal bleeding, pelvic ultrasound may be indicated.  Cirrhosis.   Splenomegaly without ascites.   Electronically Signed   By: Elon Alas   On: 06/29/2014 03:25     EKG Interpretation None      MDM   Final diagnoses:  Rectal bleed    Pt is a 80 y.o. female with Pmhx as above who presents with concern for vaginal bleeding. She states she has hx of colon cancer and her doctors know she looses blood in her stool, but tonight after a bowel mvmt, then shower, she saw a puddle of BRB on the floor and when she wiped she felt it was coming from the vagina. She also reports her explored her vagina with a finger and had blood on her finger. She has been having about 3-4 months of near daily, intermittent lower abdominal pain.  She has subjective daily fever/chills. No n/v, urinary symptoms. On PE, VSS, pt in NAD. +suprapubic ttp. Rectal w/ external and internal hemorrhoids, no acute bleeding, small amt of dark stool (heme +). On Pelvic, NO blood in vaginal canal.   Hb stable. WBC nml. Urine clear & w/o blood. CT with no acute findings except mild rectal thickening which may reflect internal hemorrhoids. I believe this is source of bleeding. She i snot bleeding currently and I feel she is safe to be d/c'd home. Strict return precautions given for new or worsening symptoms including worsening pain, fever, reoccurrence of rectal bleeding.        Ernestina Patches, MD 06/29/14 [phone]

## 2014-06-29 NOTE — ED Notes (Signed)
Pt is also on plavix.

## 2014-06-29 NOTE — ED Notes (Signed)
Pt with audible wheezing. Pt states she uses albeuterol inhaler at home. md informed and md stated ok to give inhaler.

## 2014-06-29 NOTE — Telephone Encounter (Signed)
Patient is rescheduled for 07/03/14 1:30 with Tye Savoy RNP .  I rescheduled this with her daughter

## 2014-06-29 NOTE — ED Notes (Signed)
Bed: WA03 Expected date:  Expected time:  Means of arrival:  Comments: 80 yo vaginal bleeding

## 2014-06-30 LAB — URINE CULTURE
COLONY COUNT: NO GROWTH
CULTURE: NO GROWTH

## 2014-07-02 ENCOUNTER — Other Ambulatory Visit: Payer: Self-pay | Admitting: Internal Medicine

## 2014-07-03 ENCOUNTER — Ambulatory Visit (INDEPENDENT_AMBULATORY_CARE_PROVIDER_SITE_OTHER): Payer: Medicare Other | Admitting: Nurse Practitioner

## 2014-07-03 ENCOUNTER — Encounter: Payer: Self-pay | Admitting: Nurse Practitioner

## 2014-07-03 VITALS — BP 130/82 | HR 66 | Ht 65.0 in | Wt 133.0 lb

## 2014-07-03 DIAGNOSIS — K7460 Unspecified cirrhosis of liver: Secondary | ICD-10-CM

## 2014-07-03 DIAGNOSIS — R933 Abnormal findings on diagnostic imaging of other parts of digestive tract: Secondary | ICD-10-CM

## 2014-07-03 DIAGNOSIS — K625 Hemorrhage of anus and rectum: Secondary | ICD-10-CM

## 2014-07-03 DIAGNOSIS — I6529 Occlusion and stenosis of unspecified carotid artery: Secondary | ICD-10-CM

## 2014-07-03 DIAGNOSIS — K7469 Other cirrhosis of liver: Secondary | ICD-10-CM

## 2014-07-03 MED ORDER — HYDROCORTISONE ACETATE 25 MG RE SUPP: 25.0000 mg | Freq: Two times a day (BID) | RECTAL | Status: DC

## 2014-07-03 NOTE — Patient Instructions (Addendum)
Your follow up appointment with Dr Henrene Pastor is scheduled 07/25/2014 at [place] prescription has been sent to your pharmacy Information on reflux was given today

## 2014-07-03 NOTE — Progress Notes (Signed)
History of Present Illness:   Patient is an 80 year old female known to Dr. Henrene Pastor. She has MULTIPLE SIGNIFICANT medical problems and is followed here for cirrhosis, history of colon cancer status post right hemicolectomy, chronic diarrhea and chronic anemia. She is on chronic Plavix therapy along with multiple other medications.  Patient was last seen in the office Feb 2015. She complained of minor rectal bleeding / discomfort at the time. There was posterior midline tenderness on exam. She was treated with steroid suppositories, please refer to that office note by Dr. Henrene Pastor. Patient's last colonoscopy was 10/13/2012.   Patient was in the emergency department 4 days ago for vaginal versus rectal bleeding. After showering at home patient saw drops of red blood on the floor. Unsure from where the bleeding originated patient did a vaginal exam and found a significant amount of blood in the vagina.In ED there was no blood in rhe vagina or rectum on exam but stool was Hemoccult positive. Hemoglobin was 12 (at baseline).  White count was normal.  BUN normal. CT scan with contrast revealed mild thickening of the rectum possibly related to internal hemorrhoids. Cirrhosis again seen. No ascites. Basically no acute findings. Patient has not had any more episodes of bleeding   Current Medications, Allergies, Past Medical History, Past Surgical History, Family History and Social History were reviewed in [employer] record.  Studies:   Ct Abdomen Pelvis W Contrast  06/29/2014   CLINICAL DATA:  Suprapubic pain. Vaginal and rectal bleeding. History of colon cancer, cholecystectomy, appendectomy, cirrhosis.  EXAM: CT ABDOMEN AND PELVIS WITH CONTRAST  TECHNIQUE: Multidetector CT imaging of the abdomen and pelvis was performed using the standard protocol following bolus administration of intravenous contrast.  CONTRAST:  192mL OMNIPAQUE IOHEXOL 300 MG/ML  SOLN  COMPARISON:  CT of the abdomen  and pelvis March 14, 2014  FINDINGS: LUNG BASES: Included view of the lung bases are clear. Visualized heart and pericardium are unremarkable. Irregular intimal thickening of the included thoracic aorta.  SOLID ORGANS: The liver is nodular, diffusely hypodense, stable from prior imaging. No intrahepatic mass or biliary dilatation. Portal vein appears patent. Stable splenomegaly. Status post cholecystectomy. Pancreas, adrenal glands are not acute.  GASTROINTESTINAL TRACT: Tiny hiatal hernia. Stomach is decompressed. Small bowel are normal in course and caliber, status post RIGHT hemicolectomy. Mild thickened appearance of the rectum could reflect internal hemorrhoids.  KIDNEYS/ URINARY TRACT: Kidneys are orthotopic, demonstrating symmetric enhancement. No nephrolithiasis, hydronephrosis or solid renal masses. Bilateral renal cysts, measuring up to 6.9 cm on the LEFT upper pole, unchanged. Too small to characterize hypodensities in the kidneys bilaterally. The unopacified ureters are normal in course and caliber. Delayed imaging through the kidneys demonstrates symmetric prompt contrast excretion within the proximal urinary collecting system. Urinary bladder is well distended and unremarkable.  PERITONEUM/RETROPERITONEUM: Varicosities. No intraperitoneal free fluid nor free air. Aortoiliac vessels are normal in course and intermittently ectatic without aneurysm, severe calcific atherosclerosis. No lymphadenopathy by CT size criteria. Internal reproductive organs are unremarkable for age.  SOFT TISSUE/OSSEOUS STRUCTURES: Nonsuspicious. Osteopenia. Severe L5-S1 disc degeneration resulting in moderate neural foraminal narrowing. Anterior abdominal wall scarring.  IMPRESSION: No acute intra-abdominal or pelvic process.  Mildly thickened appearance of the rectum may reflect internal hemorrhoids, status post RIGHT hemicolectomy. For history of vaginal bleeding, pelvic ultrasound may be indicated.  Cirrhosis.  Splenomegaly  without ascites.   Electronically Signed   By: Elon Alas   On: 06/29/2014 03:25   Physical Exam:  General: Pleasant, well developed , white female in no acute distress Head: Normocephalic and atraumatic Eyes:  sclerae anicteric, conjunctiva pink  Ears: Normal auditory acuity Lungs: Clear throughout to auscultation Heart: Regular rate and rhythm Abdomen: Soft, non distended, non-tender. No masses, no hepatomegaly. Normal bowel sounds Rectal: just inside anus there was an inflamed hemorrhoid  vrs rectal varix. Musculoskeletal: Symmetrical with no gross deformities  Extremities: No edema  Neurological: Alert oriented x 4, grossly nonfocal Psychological:  Alert and cooperative. Normal mood and affect  Assessment and Recommendations:  30. 80 year old female with multiple, significant medical problems. She has a history of cirrhosis complicated by portal hypertension. No varices on surveillance upper endoscopy February 2013. No evidence for decompensation at this time  2. Recent episode of rectal versus vaginal bleeding. Patient evaluated in the emergency department, no blood in vagina or anal vault.  CTscan suggested mild rectal thickening. Hemoglobin normal. On exam today patient has what appears to be an enlarged hemorrhoid just inside the anal canal. I do not think this is a rectal varix but cannot say with certainty. No bleeding since that one episode 4 days ago. Will treat with steroid suppositories as they have helped her rectal bleeding in the past. Followup with me in a couple of weeks. She will call us ASAP for recurrent bleeding. Of note, not rectal findings on last CTscan Jan 2014.

## 2014-07-04 ENCOUNTER — Encounter: Payer: Self-pay | Admitting: Nurse Practitioner

## 2014-07-04 DIAGNOSIS — K625 Hemorrhage of anus and rectum: Secondary | ICD-10-CM | POA: Insufficient documentation

## 2014-07-04 DIAGNOSIS — R933 Abnormal findings on diagnostic imaging of other parts of digestive tract: Secondary | ICD-10-CM | POA: Insufficient documentation

## 2014-07-05 NOTE — Progress Notes (Signed)
Agree with initial assessment and plans as outlined 

## 2014-07-06 ENCOUNTER — Telehealth: Payer: Self-pay | Admitting: Nurse Practitioner

## 2014-07-06 ENCOUNTER — Ambulatory Visit: Payer: Medicare Other | Admitting: Medical

## 2014-07-06 NOTE — Telephone Encounter (Signed)
Monique Fleming, I sent you a my chart message from the patient's daughter yesterday.  Will you please call her.

## 2014-07-09 ENCOUNTER — Ambulatory Visit: Payer: Medicare Other | Admitting: Nurse Practitioner

## 2014-07-10 ENCOUNTER — Ambulatory Visit: Payer: Medicare Other | Admitting: Medical

## 2014-07-12 ENCOUNTER — Other Ambulatory Visit: Payer: Self-pay | Admitting: Family Medicine

## 2014-07-12 NOTE — Telephone Encounter (Signed)
Please advise? I'm not sure who gave pt this rx before?

## 2014-07-23 NOTE — Telephone Encounter (Signed)
Pt was seen by Mackie Pai 06/27/2014 for F/U: Anemia & IFOB positive, was in hospital 9/25, and cancelled appointment for 07/06/2014 for 10 day follow up with Percell Miller and cancelled appointment on 07/10/2014 with Percell Miller for ED follow up.

## 2014-07-25 ENCOUNTER — Ambulatory Visit: Payer: Medicare Other | Admitting: Nurse Practitioner

## 2014-07-30 ENCOUNTER — Other Ambulatory Visit: Payer: Self-pay | Admitting: Family Medicine

## 2014-07-31 ENCOUNTER — Ambulatory Visit: Payer: Medicare Other | Admitting: Nurse Practitioner

## 2014-08-02 ENCOUNTER — Ambulatory Visit: Payer: Medicare Other | Admitting: Nurse Practitioner

## 2014-08-02 ENCOUNTER — Ambulatory Visit (INDEPENDENT_AMBULATORY_CARE_PROVIDER_SITE_OTHER): Payer: Medicare Other | Admitting: Nurse Practitioner

## 2014-08-02 ENCOUNTER — Encounter: Payer: Self-pay | Admitting: Nurse Practitioner

## 2014-08-02 VITALS — BP 130/70 | HR 82 | Ht 65.0 in | Wt 132.0 lb

## 2014-08-02 DIAGNOSIS — I6529 Occlusion and stenosis of unspecified carotid artery: Secondary | ICD-10-CM

## 2014-08-02 DIAGNOSIS — K649 Unspecified hemorrhoids: Secondary | ICD-10-CM

## 2014-08-02 NOTE — Patient Instructions (Signed)
Follow up with Dr. Scarlette Shorts on 10-08-2014 at 1:30 PM.  Call us for a sooner appointment if you need to be seen sooner.

## 2014-08-03 ENCOUNTER — Other Ambulatory Visit: Payer: Self-pay | Admitting: Critical Care Medicine

## 2014-08-03 ENCOUNTER — Encounter: Payer: Self-pay | Admitting: Critical Care Medicine

## 2014-08-03 ENCOUNTER — Encounter: Payer: Self-pay | Admitting: Nurse Practitioner

## 2014-08-03 ENCOUNTER — Ambulatory Visit (INDEPENDENT_AMBULATORY_CARE_PROVIDER_SITE_OTHER): Payer: Medicare Other | Admitting: Critical Care Medicine

## 2014-08-03 VITALS — BP 120/48 | HR 79 | Temp 97.5°F | Ht 63.0 in | Wt 133.0 lb

## 2014-08-03 DIAGNOSIS — I6529 Occlusion and stenosis of unspecified carotid artery: Secondary | ICD-10-CM

## 2014-08-03 DIAGNOSIS — Z720 Tobacco use: Secondary | ICD-10-CM

## 2014-08-03 DIAGNOSIS — K625 Hemorrhage of anus and rectum: Secondary | ICD-10-CM

## 2014-08-03 DIAGNOSIS — F17200 Nicotine dependence, unspecified, uncomplicated: Secondary | ICD-10-CM

## 2014-08-03 DIAGNOSIS — D500 Iron deficiency anemia secondary to blood loss (chronic): Secondary | ICD-10-CM

## 2014-08-03 DIAGNOSIS — J432 Centrilobular emphysema: Secondary | ICD-10-CM

## 2014-08-03 DIAGNOSIS — K7469 Other cirrhosis of liver: Secondary | ICD-10-CM

## 2014-08-03 LAB — PROTIME-INR
INR: 1.28 (ref <1.50)
Prothrombin Time: 16 seconds — ABNORMAL HIGH (ref 11.6–15.2)

## 2014-08-03 LAB — COMPREHENSIVE METABOLIC PANEL
ALBUMIN: 3.5 g/dL (ref 3.5–5.2)
ALK PHOS: 118 U/L — AB (ref 39–117)
ALT: 11 U/L (ref 0–35)
AST: 29 U/L (ref 0–37)
BUN: 9 mg/dL (ref 6–23)
CO2: 26 mEq/L (ref 19–32)
Calcium: 8.7 mg/dL (ref 8.4–10.5)
Chloride: 102 mEq/L (ref 96–112)
Creat: 0.76 mg/dL (ref 0.50–1.10)
GLUCOSE: 258 mg/dL — AB (ref 70–99)
POTASSIUM: 3.5 meq/L (ref 3.5–5.3)
SODIUM: 139 meq/L (ref 135–145)
Total Bilirubin: 0.5 mg/dL (ref 0.2–1.2)
Total Protein: 6.8 g/dL (ref 6.0–8.3)

## 2014-08-03 LAB — CBC WITH DIFFERENTIAL/PLATELET
BASOS PCT: 0 % (ref 0–1)
Basophils Absolute: 0 10*3/uL (ref 0.0–0.1)
EOS ABS: 0.2 10*3/uL (ref 0.0–0.7)
EOS PCT: 3 % (ref 0–5)
HCT: 39.3 % (ref 36.0–46.0)
HEMOGLOBIN: 12.8 g/dL (ref 12.0–15.0)
LYMPHS ABS: 1.1 10*3/uL (ref 0.7–4.0)
Lymphocytes Relative: 19 % (ref 12–46)
MCH: 27.4 pg (ref 26.0–34.0)
MCHC: 32.6 g/dL (ref 30.0–36.0)
MCV: 84.2 fL (ref 78.0–100.0)
MONO ABS: 0.6 10*3/uL (ref 0.1–1.0)
MONOS PCT: 10 % (ref 3–12)
Neutro Abs: 3.9 10*3/uL (ref 1.7–7.7)
Neutrophils Relative %: 68 % (ref 43–77)
Platelets: 106 10*3/uL — ABNORMAL LOW (ref 150–400)
RBC: 4.67 MIL/uL (ref 3.87–5.11)
RDW: 15.6 % — ABNORMAL HIGH (ref 11.5–15.5)
WBC: 5.8 10*3/uL (ref 4.0–10.5)

## 2014-08-03 MED ORDER — FLUTICASONE PROPIONATE 50 MCG/ACT NA SUSP: 2.0000 | Freq: Every day | NASAL | Status: DC

## 2014-08-03 NOTE — Assessment & Plan Note (Signed)
Copd gold B stable at present Pt declined flu vaccine No change in inhaled meds

## 2014-08-03 NOTE — Patient Instructions (Signed)
No change in medications

## 2014-08-03 NOTE — Assessment & Plan Note (Signed)
Hx of rectal bleeding Pt desires lab draw

## 2014-08-03 NOTE — Progress Notes (Signed)
     History of Present Illness:  Patient is an 80 year old female known to Dr. Henrene Pastor. She has multiple, significant problems and is followed there for cirrhosis, history of colon cancer status post right hemicolectomy, chronic diarrhea chronic anemia. She is on chronic Plavix as well as other multiple medications. Patient has been seen here at least twice since February for evaluation of minor rectal bleeding. She was last seen 07/03/14 for evaluation of bleeding and CT scan suggesting mild rectal wall thickening. On exam, just inside the anus there was a large inflamed hemorrhoid versus rectal varix ( former was favored). Patient was treated with steroid suppositories and returns for followup. The rectal bleeding has resolved. Patient has chronic diarrhea this has been evaluated in the past.  Patient feels okay from a gastrointestinal standpoint. Her only complaint is that of weakness  Current Medications, Allergies, Past Medical History, Past Surgical History, Family History and Social History were reviewed in [employer] record.  Physical Exam: General: Pleasant, well developed , white female in no acute distress Head: Normocephalic and atraumatic Eyes:  sclerae anicteric, conjunctiva pink  Ears: Normal auditory acuity Lungs:  Expiratory wheezing in chest Heart: Regular rate and rhythm Abdomen: Soft, non distended, non-tender. No masses, no hepatomegaly. Normal bowel sounds Rectal: no external lesion. The previously enlarged hemorrhoid not visualized this time (just inside anus) Musculoskeletal: Symmetrical with no gross deformities  Extremities: No edema  Neurological: Alert oriented x 4, grossly nonfocal Psychological:  Alert and cooperative. Normal mood and affect  Assessment and Recommendations:  63.  80 year old female with chronic, intermittent minor rectal bleeding. She has internal hemorrhoids. Patient has history of rectal varices as well but bleeding felt  to be hemorrhoidal in nature. Recently treated with a course of steroid suppositories and bleeding has since resolved. I do not appreciate the previously seen inflamed internal hemorrhoids on exam today. Patient will call us if she has any recurrent bleeding. Patient does have a history of colon cancer, status post hemicolectomy. She is up-to-date on surveillance colonoscopy, last one in January 2014  2. Cirrhosis, compensated. Routine follow up of cirrhosis with Dr. Henrene Pastor, her primary gastroenterologist, in a couple of months.

## 2014-08-03 NOTE — Assessment & Plan Note (Signed)
Pt reports still smokes occasionally

## 2014-08-03 NOTE — Progress Notes (Signed)
Subjective:    Patient ID: Monique Fleming, female    DOB: July 04, 1929, 80 y.o.   MRN: [id number]  HPI  80 y.o.F Sees [facility] for lung dx x 47yrs.  08/03/2014 Chief Complaint  Patient presents with  . Follow-up    COPD; wheezing, SOB, chest tightness, losing blood (anemic) due to colon issues; extremely weak, wants to have blood work done  Pt remains weak and wheezing.  Pt notes some blood loss from colon.  Pt fell 49months ago, now pinkish color when uses restroom.  PCP is [facility].   Hx of angiodysplasia of intestines and cirrhosis of liver.  Last labs 06/29/14. Per PCP Pt cannot sleep well ??needs nocturnal oxygen     Review of Systems  Constitutional: Positive for appetite change. Negative for unexpected weight change.  HENT: Positive for congestion. Negative for dental problem, nosebleeds, postnasal drip, sinus pressure, sneezing, trouble swallowing and voice change.   Eyes: Negative for redness and itching.  Respiratory: Positive for cough. Negative for chest tightness.   Cardiovascular: Negative for palpitations.  Gastrointestinal: Negative for nausea.       Pos for GERD  Genitourinary: Negative for dysuria.  Musculoskeletal: Negative for joint swelling.  Hematological: Does not bruise/bleed easily.  Psychiatric/Behavioral: Positive for dysphoric mood. The patient is nervous/anxious.        Objective:   Physical Exam  Filed Vitals:   08/03/14 1112  BP: 120/48  Pulse: 79  Temp: 97.5 F (36.4 C)  TempSrc: Oral  Height: 5\' 3"  (1.6 m)  Weight: 133 lb (60.328 kg)  SpO2: 94%    Gen: Pleasant, well-nourished, in no distress,  normal affect  ENT: No lesions,  mouth clear,  oropharynx clear, no postnasal drip  Neck: No JVD, no TMG, no carotid bruits  Lungs: No use of accessory muscles, no dullness to percussion, expired wheezes with poor airflow  Cardiovascular: RRR, heart sounds normal, no murmur or gallops, no peripheral edema  Abdomen: soft and NT, no HSM,  BS  normal  Musculoskeletal: No deformities, no cyanosis or clubbing  Neuro: alert, non focal  Skin: Warm, no lesions or rashes  No results found.     Assessment & Plan:   COPD (chronic obstructive pulmonary disease) Gold B  Copd gold B stable at present Pt declined flu vaccine No change in inhaled meds   Rectal bleeding Hx of rectal bleeding Pt desires lab draw  TOBACCO ABUSE, Hx of Pt reports still smokes occasionally      Updated Medication List Outpatient Encounter Prescriptions as of 08/03/2014  Medication Sig  . albuterol (PROAIR HFA) 108 (90 BASE) MCG/ACT inhaler Inhale 2 puffs into the lungs every 4 (four) hours as needed for wheezing or shortness of breath.  Marland Kitchen albuterol (PROVENTIL) (2.5 MG/3ML) 0.083% nebulizer solution Take 2.5 mg by nebulization every 4 (four) hours as needed for wheezing or shortness of breath.  . cholestyramine (QUESTRAN) 4 G packet Take 4 g by mouth daily as needed (diarrhea).   . clonazePAM (KLONOPIN) 1 MG tablet Take 1 mg by mouth 2 (two) times daily as needed for anxiety.   . clopidogrel (PLAVIX) 75 MG tablet TAKE ONE (1) TABLET EACH DAY  . cyanocobalamin 500 MCG tablet Take 500 mcg by mouth daily.  Marland Kitchen desvenlafaxine (PRISTIQ) 100 MG 24 hr tablet Take 100 mg by mouth daily.   . ferrous sulfate 325 (65 FE) MG tablet Take 650 mg by mouth daily after breakfast.  . fluticasone (FLONASE) 50 MCG/ACT nasal spray Place  2 sprays into both nostrils daily.  . Fluticasone-Salmeterol (ADVAIR) 250-50 MCG/DOSE AEPB Inhale 1 puff into the lungs 2 (two) times daily.  . hydrocortisone (RECTACORT-HC) 25 MG suppository UNWRAP AND INSERT 1 SUPPOSITORY RECTALLY twice a day  . Multiple Vitamin (MULTIVITAMIN WITH MINERALS) TABS tablet Take 1 tablet by mouth daily.  . Multiple Vitamins-Minerals (OCUVITE PRESERVISION) TABS Take by mouth 2 (two) times daily.  . ondansetron (ZOFRAN) 4 MG tablet Take 1 tablet (4 mg total) by mouth every 6 (six) hours as needed for nausea  or vomiting.  . pantoprazole (PROTONIX) 40 MG tablet Take 40 mg by mouth daily.  Vladimir Faster Glycol-Propyl Glycol (SYSTANE) 0.4-0.3 % SOLN Place 1 drop into both eyes 2 (two) times daily as needed.  . potassium chloride (K-DUR) 10 MEQ tablet Take 10 mEq by mouth 2 (two) times daily.  Marland Kitchen saccharomyces boulardii (FLORASTOR) 250 MG capsule Take 250 mg by mouth daily.  . traMADol (ULTRAM) 50 MG tablet TAKE ONE (1) TABLET EVERY 6 HOURS AS NEEDED FOR PAIN (MODERATE PAIN)  . [DISCONTINUED] fluticasone (FLONASE) 50 MCG/ACT nasal spray Place 2 sprays into both nostrils daily.  . [DISCONTINUED] glimepiride (AMARYL) 1 MG tablet Take 1 mg by mouth daily as needed (according to blood sugar readings above 120).  . [DISCONTINUED] phenytoin (DILANTIN) 100 MG ER capsule Take 200 mg by mouth at bedtime.

## 2014-08-06 NOTE — Progress Notes (Signed)
Agree with initial assessment and plans 

## 2014-08-06 NOTE — Progress Notes (Signed)
Quick Note:  Called, spoke with pt. Informed her of lab results per Dr. Joya Gaskins. She verbalized understanding and voiced no further questions or concerns at this time. ______

## 2014-08-08 ENCOUNTER — Encounter: Payer: Self-pay | Admitting: Medical

## 2014-08-08 DIAGNOSIS — D696 Thrombocytopenia, unspecified: Secondary | ICD-10-CM

## 2014-08-13 ENCOUNTER — Telehealth: Payer: Self-pay | Admitting: Critical Care Medicine

## 2014-08-13 NOTE — Telephone Encounter (Signed)
atc X2, line rang for over a minute, no voicemail set up.  WCB

## 2014-08-13 NOTE — Telephone Encounter (Signed)
Fatigue and low platelets. Will refer to Dr. Arlester Marker)

## 2014-08-13 NOTE — Telephone Encounter (Signed)
Called and spoke with pt, Pt concerned with cyst/knot on back of neck about the size of a quarter. Pt wanting to know if she needs to go ahead and move forward with ONO scheduled for tonight. Advised pt to keep this appt and have the test tonight.  Pt has not spoken with PCP to see if they could see her to evaluate this spot--pt advised to contact her PCP to speak with Dr Randel Pigg about this. Pt to call our office if anything further needed.

## 2014-08-14 ENCOUNTER — Ambulatory Visit (INDEPENDENT_AMBULATORY_CARE_PROVIDER_SITE_OTHER): Payer: Medicare Other | Admitting: Physician Assistant

## 2014-08-14 ENCOUNTER — Encounter: Payer: Self-pay | Admitting: Physician Assistant

## 2014-08-14 ENCOUNTER — Encounter: Payer: Self-pay | Admitting: Medical

## 2014-08-14 VITALS — BP 149/42 | HR 87 | Temp 98.2°F | Resp 16 | Ht 65.0 in | Wt 133.4 lb

## 2014-08-14 DIAGNOSIS — M542 Cervicalgia: Secondary | ICD-10-CM

## 2014-08-14 DIAGNOSIS — I6529 Occlusion and stenosis of unspecified carotid artery: Secondary | ICD-10-CM

## 2014-08-14 NOTE — Progress Notes (Signed)
Pre visit review using our clinic review tool, if applicable. No additional management support is needed unless otherwise documented below in the visit note/SLS  

## 2014-08-14 NOTE — Patient Instructions (Signed)
Please increase your fluids.  Get plenty of rest.  Continue your respiratory medications as directed by Dr. Joya Gaskins.  I have refilled your Albuterol.  The knot you notice seems to either be a reactive lymph node or a cyst.  If related to a mild URI, the lymph node should begin to decrease in size.  Follow-up with me in 1 week.  We will reassess this area of concern.  For neck discomfort, please apply a topical Aspercreme to the neck to help with discomfort.

## 2014-08-14 NOTE — Progress Notes (Signed)
Patient presents to clinic today c/o intermittent posterior cervical neck pain with turning her head to the left x 1 week.  Denies trauma or injury.  Pain is described as a soreness.  Patient also endorses a knot on the back of her neck at site of pain.  Denies other complaints or symptoms at present, but notes she did have a little cold a few days ago that has resolved.  Past Medical History  Diagnosis Date  . Aortic stenosis     moderate by echo 6/11 gradient 20/71mHg for mean and peak  . TIA (transient ischemic attack)     Dr. SLeonie Man . Peripheral vascular disease   . IBS (irritable bowel syndrome)   . Diabetes mellitus     type II  . Chronic bronchitis   . COPD (chronic obstructive pulmonary disease)   . Atherosclerosis   . Depression   . Anxiety     Dr PCasimiro Needle . Renal cyst   . Anemia     iron deficiency  . Cellulitis   . Diverticular disease   . Infectious diarrhea(009.2)   . Sinusitis   . Thrush   . Abnormal liver function test   . GERD (gastroesophageal reflux disease)   . Insomnia     chronic  . Adenocarcinoma 2008    colon,s/p right hemicolectomy  . Angina   . Seizures     hx of one seizure "  . Cirrhosis of liver     hx of  . Clostridium difficile diarrhea   . Thrombocytopenia 04/01/2013  . Bursitis     spine per pt  . Macular degeneration 02/18/2014    [facility] Internal [facility]oids     Current Outpatient Prescriptions on File Prior to Visit  Medication Sig Dispense Refill  . albuterol (PROAIR HFA) 108 (90 BASE) MCG/ACT inhaler Inhale 2 puffs into the lungs every 4 (four) hours as needed for wheezing or shortness of breath.    .Marland Kitchenalbuterol (PROVENTIL) (2.5 MG/3ML) 0.083% nebulizer solution Take 2.5 mg by nebulization every 4 (four) hours as needed for wheezing or shortness of breath.    . cholestyramine (QUESTRAN) 4 G packet Take 4 g by mouth daily as needed (diarrhea).     . clonazePAM (KLONOPIN) 1 MG tablet Take 1 mg by mouth 2 (two)  times daily as needed for anxiety.     . clopidogrel (PLAVIX) 75 MG tablet TAKE ONE (1) TABLET EACH DAY 90 tablet 0  . cyanocobalamin 500 MCG tablet Take 500 mcg by mouth daily.    .Marland Kitchendesvenlafaxine (PRISTIQ) 100 MG 24 hr tablet Take 100 mg by mouth daily.     . ferrous sulfate 325 (65 FE) MG tablet Take 650 mg by mouth daily after breakfast.    . fluticasone (FLONASE) 50 MCG/ACT nasal spray Place 2 sprays into both nostrils daily. 16 g 6  . Fluticasone-Salmeterol (ADVAIR) 250-50 MCG/DOSE AEPB Inhale 1 puff into the lungs 2 (two) times daily. 1 each 6  . hydrocortisone (RECTACORT-HC) 25 MG suppository UNWRAP AND INSERT 1 SUPPOSITORY RECTALLY twice a day    . Multiple Vitamin (MULTIVITAMIN WITH MINERALS) TABS tablet Take 1 tablet by mouth daily.    . Multiple Vitamins-Minerals (OCUVITE PRESERVISION) TABS Take by mouth 2 (two) times daily.    . ondansetron (ZOFRAN) 4 MG tablet Take 1 tablet (4 mg total) by mouth every 6 (six) hours as needed for nausea or vomiting. 30 tablet 1  . pantoprazole (PROTONIX)  40 MG tablet Take 40 mg by mouth daily.    Vladimir Faster Glycol-Propyl Glycol (SYSTANE) 0.4-0.3 % SOLN Place 1 drop into both eyes 2 (two) times daily as needed.    . potassium chloride (K-DUR) 10 MEQ tablet Take 10 mEq by mouth 2 (two) times daily.    Marland Kitchen saccharomyces boulardii (FLORASTOR) 250 MG capsule Take 250 mg by mouth daily.    . traMADol (ULTRAM) 50 MG tablet TAKE ONE (1) TABLET EVERY 6 HOURS AS NEEDED FOR PAIN (MODERATE PAIN) 60 tablet 0   No current facility-administered medications on file prior to visit.    Allergies  Allergen Reactions  . Bactrim [Sulfamethoxazole-Trimethoprim] Shortness Of Breath  . Levofloxacin Other (See Comments)    seizure  . Pneumovax [Pneumococcal Polysaccharide Vaccine] Other (See Comments)    Severe allergy-cellulitis  . Amoxicillin-Pot Clavulanate Other (See Comments)    Patient stated Drs. recommend taking Augmentin due to her PCN allergy.  .  Penicillins Rash  . Bupropion   . Haldol [Haloperidol Lactate]     Had unknown reaction many yrs ago per pt  . Haloperidol   . Morphine   . Morphine And Related     Confused and agitation  . Pneumococcal Vaccine   . Pseudoephedrine Hcl   . Sudafed [Pseudoephedrine Hcl] Other (See Comments)    seizure    Family History  Problem Relation Age of Onset  . Colon cancer Paternal Grandmother   . Esophageal cancer Brother   . Heart disease Mother   . Diabetes Brother   . Hypertension Father   . Rectal cancer Neg Hx   . Stomach cancer Neg Hx     History   Social History  . Marital Status: Widowed    Spouse Name: N/A    Number of Children: 3  . Years of Education: N/A   Occupational History  . retired   .     Social History Main Topics  . Smoking status: Current Some Day Smoker -- 0.50 packs/day for 70 years    Types: Cigarettes    Last Attempt to Quit: 01/03/2014  . Smokeless tobacco: Never Used     Comment: Used Electronic cigarette  . Alcohol Use: No  . Drug Use: No  . Sexual Activity: No   Other Topics Concern  . None   Social History Narrative   Patient signed a Environmental consultant to allow her daughter, Maelle Sheaffer, to have access to her medical records/information. Entered by Fleet Contras March 24,2011 @ 2:47 pm   Dailly Caffeine Use   Review of Systems - See HPI.  All other ROS are negative.  BP 149/42 mmHg  Pulse 87  Temp(Src) 98.2 F (36.8 C) (Oral)  Resp 16  Ht _0  (1.651 m)  Wt 133 lb 6 oz (60.499 kg)  BMI 22.19 kg/m2  SpO2 97%  Physical Exam  Constitutional: She is oriented to person, place, and time and well-developed, well-nourished, and in no distress.  HENT:  Head: Normocephalic and atraumatic.  Right Ear: External ear normal.  Left Ear: External ear normal.  Nose: Nose normal.  Mouth/Throat: Oropharynx is clear and moist. No oropharyngeal exudate.  TM within normal limits bilaterally.  Eyes: Conjunctivae are normal. Pupils are  equal, round, and reactive to light.  Neck: Normal range of motion. Neck supple.  Cardiovascular: Normal rate, regular rhythm, normal heart sounds and intact distal pulses.   Pulmonary/Chest: Effort normal and breath sounds normal. No respiratory distress. She has no wheezes.  She has no rales. She exhibits no tenderness.  Lymphadenopathy:    She has cervical adenopathy.       Right cervical: No superficial cervical, no deep cervical and no posterior cervical adenopathy present.      Left cervical: Posterior cervical adenopathy present. No superficial cervical and no deep cervical adenopathy present.  Palpable node is non-tender and about 1 cm in diameter. No evidence of fluctuance to suggest abscess.  Neurological: She is alert and oriented to person, place, and time.  Skin: Skin is warm and dry. No pallor.  Psychiatric: Affect normal.  Vitals reviewed.   Recent Results (from the past 2160 hour(s))  CBC w/Diff     Status: Abnormal   Collection Time: 05/21/14 11:09 AM  Result Value Ref Range   WBC 10.2 4.0 - 10.5 K/uL   RBC 4.16 3.87 - 5.11 MIL/uL   Hemoglobin 11.2 (L) 12.0 - 15.0 g/dL   HCT 33.7 (L) 36.0 - 46.0 %   MCV 81.0 78.0 - 100.0 fL   MCH 26.9 26.0 - 34.0 pg   MCHC 33.2 30.0 - 36.0 g/dL   RDW 17.6 (H) 11.5 - 15.5 %   Platelets 120 (L) 150 - 400 K/uL   Neutrophils Relative % 74 43 - 77 %   Neutro Abs 7.5 1.7 - 7.7 K/uL   Lymphocytes Relative 15 12 - 46 %   Lymphs Abs 1.5 0.7 - 4.0 K/uL   Monocytes Relative 9 3 - 12 %   Monocytes Absolute 0.9 0.1 - 1.0 K/uL   Eosinophils Relative 2 0 - 5 %   Eosinophils Absolute 0.2 0.0 - 0.7 K/uL   Basophils Relative 0 0 - 1 %   Basophils Absolute 0.0 0.0 - 0.1 K/uL   Smear Review Criteria for review not met   Basic Metabolic Panel (BMET)     Status: Abnormal   Collection Time: 05/21/14 11:09 AM  Result Value Ref Range   Sodium 139 135 - 145 mEq/L   Potassium 4.1 3.5 - 5.3 mEq/L   Chloride 103 96 - 112 mEq/L   CO2 28 19 - 32 mEq/L     Glucose, Bld 199 (H) 70 - 99 mg/dL   BUN 10 6 - 23 mg/dL   Creat 0.74 0.50 - 1.10 mg/dL   Calcium 8.3 (L) 8.4 - 10.5 mg/dL  Fecal occult blood, imunochemical     Status: Abnormal   Collection Time: 06/25/14  4:14 PM  Result Value Ref Range   Fecal Occult Bld Positive (A) Negative  CBC w/Diff     Status: Abnormal   Collection Time: 06/27/14  2:33 PM  Result Value Ref Range   WBC 5.8 4.0 - 10.5 K/uL   RBC 4.09 3.87 - 5.11 Mil/uL   Hemoglobin 11.5 (L) 12.0 - 15.0 g/dL   HCT 34.4 (L) 36.0 - 46.0 %   MCV 84.1 78.0 - 100.0 fl   MCHC 33.3 30.0 - 36.0 g/dL   RDW 17.3 (H) 11.5 - 15.5 %   Platelets 126.0 (L) 150.0 - 400.0 K/uL   Neutrophils Relative % 68.0 43.0 - 77.0 %   Lymphocytes Relative 20.2 12.0 - 46.0 %   Monocytes Relative 8.8 3.0 - 12.0 %   Eosinophils Relative 2.7 0.0 - 5.0 %   Basophils Relative 0.3 0.0 - 3.0 %   Neutro Abs 4.0 1.4 - 7.7 K/uL   Lymphs Abs 1.2 0.7 - 4.0 K/uL   Monocytes Absolute 0.5 0.1 - 1.0 K/uL   Eosinophils  Absolute 0.2 0.0 - 0.7 K/uL   Basophils Absolute 0.0 0.0 - 0.1 K/uL  CBC with Differential     Status: Abnormal   Collection Time: 06/29/14  1:19 AM  Result Value Ref Range   WBC 7.9 4.0 - 10.5 K/uL   RBC 4.36 3.87 - 5.11 MIL/uL   Hemoglobin 12.0 12.0 - 15.0 g/dL   HCT 36.9 36.0 - 46.0 %   MCV 84.6 78.0 - 100.0 fL   MCH 27.5 26.0 - 34.0 pg   MCHC 32.5 30.0 - 36.0 g/dL   RDW 16.0 (H) 11.5 - 15.5 %   Platelets 113 (L) 150 - 400 K/uL    Comment: REPEATED TO VERIFY PLATELET COUNT CONFIRMED BY SMEAR SPECIMEN CHECKED FOR CLOTS   Neutrophils Relative % 69 43 - 77 %   Lymphocytes Relative 19 12 - 46 %   Monocytes Relative 9 3 - 12 %   Eosinophils Relative 3 0 - 5 %   Basophils Relative 0 0 - 1 %   Neutro Abs 5.5 1.7 - 7.7 K/uL   Lymphs Abs 1.5 0.7 - 4.0 K/uL   Monocytes Absolute 0.7 0.1 - 1.0 K/uL   Eosinophils Absolute 0.2 0.0 - 0.7 K/uL   Basophils Absolute 0.0 0.0 - 0.1 K/uL   RBC Morphology POLYCHROMASIA PRESENT     Comment: ELLIPTOCYTES   Basic metabolic panel     Status: Abnormal   Collection Time: 06/29/14  1:19 AM  Result Value Ref Range   Sodium 142 137 - 147 mEq/L   Potassium 3.5 (L) 3.7 - 5.3 mEq/L   Chloride 102 96 - 112 mEq/L   CO2 25 19 - 32 mEq/L   Glucose, Bld 151 (H) 70 - 99 mg/dL   BUN 10 6 - 23 mg/dL   Creatinine, Ser 0.69 0.50 - 1.10 mg/dL   Calcium 8.4 8.4 - 10.5 mg/dL   GFR calc non Af Amer 79 (L) >90 mL/min   GFR calc Af Amer >90 >90 mL/min    Comment: (NOTE) The eGFR has been calculated using the CKD EPI equation. This calculation has not been validated in all clinical situations. eGFR's persistently <90 mL/min signify possible Chronic Kidney Disease.   Anion gap 15 5 - 15  Urinalysis, Routine w reflex microscopic     Status: Abnormal   Collection Time: 06/29/14  1:21 AM  Result Value Ref Range   Color, Urine YELLOW YELLOW   APPearance CLEAR CLEAR   Specific Gravity, Urine 1.008 1.005 - 1.030   pH 7.0 5.0 - 8.0   Glucose, UA NEGATIVE NEGATIVE mg/dL   Hgb urine dipstick NEGATIVE NEGATIVE   Bilirubin Urine NEGATIVE NEGATIVE   Ketones, ur NEGATIVE NEGATIVE mg/dL   Protein, ur 30 (A) NEGATIVE mg/dL   Urobilinogen, UA 0.2 0.0 - 1.0 mg/dL   Nitrite NEGATIVE NEGATIVE   Leukocytes, UA NEGATIVE NEGATIVE  Urine culture     Status: None   Collection Time: 06/29/14  1:21 AM  Result Value Ref Range   Specimen Description URINE, CLEAN CATCH    Special Requests NONE    Culture  Setup Time      06/29/2014 05:50 Performed at [place] Performed at [facility]    Culture NO GROWTH Performed at [facility]    Report Status 06/30/2014 FINAL   Urine microscopic-add on     Status: None   Collection Time: 06/29/14  1:21 AM  Result Value Ref Range  Urine-Other      NO FORMED ELEMENTS SEEN ON URINE MICROSCOPIC EXAMINATION  Type and screen     Status: None   Collection Time: 06/29/14  1:23 AM  Result Value Ref Range   ABO/RH(D) O POS    Antibody  Screen NEG    Sample Expiration 07/02/2014   POC occult blood, ED Provider will collect     Status: Abnormal   Collection Time: 06/29/14  1:28 AM  Result Value Ref Range   Fecal Occult Bld POSITIVE (A) NEGATIVE  I-Stat CG4 Lactic Acid, ED     Status: None   Collection Time: 06/29/14  1:31 AM  Result Value Ref Range   Lactic Acid, Venous 1.80 0.5 - 2.2 mmol/L  Protime-INR     Status: Abnormal   Collection Time: 08/03/14 11:53 AM  Result Value Ref Range   Prothrombin Time 16.0 (H) 11.6 - 15.2 seconds   INR 1.28 <1.50    Comment: The INR is of principal utility in following patients on stable doses of oral anticoagulants.  The therapeutic range is generally 2.0 to 3.0, but may be 3.0 to 4.0 in patients with mechanical cardiac valves, recurrent embolisms and antiphospholipid antibodies (including lupus inhibitors).  Comp Met (CMET)     Status: Abnormal   Collection Time: 08/03/14 11:58 AM  Result Value Ref Range   Sodium 139 135 - 145 mEq/L   Potassium 3.5 3.5 - 5.3 mEq/L   Chloride 102 96 - 112 mEq/L   CO2 26 19 - 32 mEq/L   Glucose, Bld 258 (H) 70 - 99 mg/dL   BUN 9 6 - 23 mg/dL   Creat 0.76 0.50 - 1.10 mg/dL   Total Bilirubin 0.5 0.2 - 1.2 mg/dL   Alkaline Phosphatase 118 (H) 39 - 117 U/L   AST 29 0 - 37 U/L   ALT 11 0 - 35 U/L   Total Protein 6.8 6.0 - 8.3 g/dL   Albumin 3.5 3.5 - 5.2 g/dL   Calcium 8.7 8.4 - 10.5 mg/dL  CBC w/Diff     Status: Abnormal   Collection Time: 08/03/14 12:00 PM  Result Value Ref Range   WBC 5.8 4.0 - 10.5 K/uL   RBC 4.67 3.87 - 5.11 MIL/uL   Hemoglobin 12.8 12.0 - 15.0 g/dL   HCT 39.3 36.0 - 46.0 %   MCV 84.2 78.0 - 100.0 fL   MCH 27.4 26.0 - 34.0 pg   MCHC 32.6 30.0 - 36.0 g/dL   RDW 15.6 (H) 11.5 - 15.5 %   Platelets 106 (L) 150 - 400 K/uL   Neutrophils Relative % 68 43 - 77 %   Neutro Abs 3.9 1.7 - 7.7 K/uL   Lymphocytes Relative 19 12 - 46 %   Lymphs Abs 1.1 0.7 - 4.0 K/uL   Monocytes Relative 10 3 - 12 %   Monocytes Absolute 0.6  0.1 - 1.0 K/uL   Eosinophils Relative 3 0 - 5 %   Eosinophils Absolute 0.2 0.0 - 0.7 K/uL   Basophils Relative 0 0 - 1 %   Basophils Absolute 0.0 0.0 - 0.1 K/uL   Smear Review Criteria for review not met     Assessment/Plan: NECK PAIN Secondary to palpable lymph node being pressed with ROM of neck.  Giving recent URI and allergy symptoms, likely reactive in nature.  Physical exam otherwise unremarkable.  Encouraged increase fluids and rest.  Topical Aspercreme to help with neck tenderness.  Follow-up in 1-2 weeks for reassessment  of node.  If still present, will proceed with labs and imaging giving patient's history.

## 2014-08-14 NOTE — Progress Notes (Unsigned)
Left message on daughters(Betsey) answering machine regarding referral to Hematology.

## 2014-08-15 ENCOUNTER — Other Ambulatory Visit: Payer: Self-pay | Admitting: *Deleted

## 2014-08-15 DIAGNOSIS — Z85038 Personal history of other malignant neoplasm of large intestine: Secondary | ICD-10-CM

## 2014-08-15 DIAGNOSIS — D509 Iron deficiency anemia, unspecified: Secondary | ICD-10-CM

## 2014-08-15 DIAGNOSIS — D485 Neoplasm of uncertain behavior of skin: Secondary | ICD-10-CM

## 2014-08-15 NOTE — Assessment & Plan Note (Signed)
Secondary to palpable lymph node being pressed with ROM of neck.  Giving recent URI and allergy symptoms, likely reactive in nature.  Physical exam otherwise unremarkable.  Encouraged increase fluids and rest.  Topical Aspercreme to help with neck tenderness.  Follow-up in 1-2 weeks for reassessment of node.  If still present, will proceed with labs and imaging giving patient's history.

## 2014-08-16 ENCOUNTER — Encounter: Payer: Self-pay | Admitting: Family

## 2014-08-16 ENCOUNTER — Ambulatory Visit (HOSPITAL_BASED_OUTPATIENT_CLINIC_OR_DEPARTMENT_OTHER): Payer: Medicare Other | Admitting: Family

## 2014-08-16 ENCOUNTER — Other Ambulatory Visit (HOSPITAL_BASED_OUTPATIENT_CLINIC_OR_DEPARTMENT_OTHER): Payer: Medicare Other | Admitting: Lab

## 2014-08-16 VITALS — BP 157/67 | HR 90 | Temp 97.8°F | Resp 16 | Ht 65.0 in | Wt 134.0 lb

## 2014-08-16 DIAGNOSIS — Z85038 Personal history of other malignant neoplasm of large intestine: Secondary | ICD-10-CM

## 2014-08-16 DIAGNOSIS — D509 Iron deficiency anemia, unspecified: Secondary | ICD-10-CM

## 2014-08-16 DIAGNOSIS — D485 Neoplasm of uncertain behavior of skin: Secondary | ICD-10-CM

## 2014-08-16 DIAGNOSIS — C189 Malignant neoplasm of colon, unspecified: Secondary | ICD-10-CM

## 2014-08-16 LAB — CBC WITH DIFFERENTIAL (CANCER CENTER ONLY)
BASO#: 0.1 10*3/uL (ref 0.0–0.2)
BASO%: 0.7 % (ref 0.0–2.0)
EOS%: 7.4 % — AB (ref 0.0–7.0)
Eosinophils Absolute: 0.5 10*3/uL (ref 0.0–0.5)
HEMATOCRIT: 36.8 % (ref 34.8–46.6)
HEMOGLOBIN: 12 g/dL (ref 11.6–15.9)
LYMPH#: 1.3 10*3/uL (ref 0.9–3.3)
LYMPH%: 18 % (ref 14.0–48.0)
MCH: 28.4 pg (ref 26.0–34.0)
MCHC: 32.6 g/dL (ref 32.0–36.0)
MCV: 87 fL (ref 81–101)
MONO#: 0.6 10*3/uL (ref 0.1–0.9)
MONO%: 8.2 % (ref 0.0–13.0)
NEUT#: 4.7 10*3/uL (ref 1.5–6.5)
NEUT%: 65.7 % (ref 39.6–80.0)
Platelets: 131 10*3/uL — ABNORMAL LOW (ref 145–400)
RBC: 4.23 10*6/uL (ref 3.70–5.32)
RDW: 15 % (ref 11.1–15.7)
WBC: 7.2 10*3/uL (ref 3.9–10.0)

## 2014-08-16 LAB — PROTIME-INR (CHCC SATELLITE)
INR: 1.1 — AB (ref 2.0–3.5)
Protime: 13.2 Seconds (ref 10.6–13.4)

## 2014-08-16 LAB — FERRITIN CHCC: Ferritin: 32 ng/ml (ref 9–269)

## 2014-08-16 LAB — IRON AND TIBC CHCC
%SAT: 53 % (ref 21–57)
Iron: 147 ug/dL — ABNORMAL HIGH (ref 41–142)
TIBC: 279 ug/dL (ref 236–444)
UIBC: 132 ug/dL (ref 120–384)

## 2014-08-16 NOTE — Progress Notes (Signed)
[place]  Telephone:(336) [phone] Fax:(336) [phone]  ID: Monique Fleming OB: Jun 27, 1929 MR#: [id number]#:[id number] Patient Care Team: Mosie Lukes, MD as PCP - General (Family Medicine) Irene Shipper, MD (Gastroenterology)  DIAGNOSIS: 1. Stage I (T1 N0 M0) adenocarcinoma of the right colon. 2. Intermittent iron-deficiency anemia.  INTERVAL HISTORY: Monique Fleming comes is here today for a follow-up. She is doing ok. She has had 2 falls since she was last here and has been recuperating from those. She is looking forward to her joint birthday with her granddaughter this weekend.  She denies fever, chills, n/v, cough, rash, headache, dizziness, chest pain, palpitations, abdominal pain, constipation, diarrhea, blood in urine or stool. She does have SOB with her COPD but this is not a new issue.  No swelling, tenderness, numbness or tingling in her extremities.  Her appetite is ok and she is drinking lots of fluids to stay hydrated. Her weight is stable. She has had no issues with respect to colon cancer. She is now 6 years out from resection.She had her resection back in May 2008. She is asymptomatic at this time.  She stopped smoking over 3 years ago.  CURRENT TREATMENT: Observation  REVIEW OF SYSTEMS: All other 10 point review of systems is negative.   PAST MEDICAL HISTORY: Past Medical History  Diagnosis Date  . Aortic stenosis     moderate by echo 6/11 gradient 20/41mmHg for mean and peak  . TIA (transient ischemic attack)     Dr. [name]  . Peripheral vascular disease   . IBS (irritable bowel syndrome)   . Diabetes mellitus     type II  . Chronic bronchitis   . COPD (chronic obstructive pulmonary disease)   . Atherosclerosis   . Depression   . Anxiety     Dr Casimiro Needle  . Renal cyst   . Anemia     iron deficiency  . Cellulitis   . Diverticular disease   . Infectious diarrhea(009.2)   . Sinusitis   . Thrush   . Abnormal liver function test   . GERD  (gastroesophageal reflux disease)   . Insomnia     chronic  . Adenocarcinoma 2008    colon,s/p right hemicolectomy  . Angina   . Seizures     hx of one seizure "  . Cirrhosis of liver     hx of  . Clostridium difficile diarrhea   . Thrombocytopenia 04/01/2013  . Bursitis     spine per pt  . Macular degeneration 02/18/2014    Follows with [facility]  . Internal hemorrhoids     PAST SURGICAL HISTORY: Past Surgical History  Procedure Laterality Date  . Carotid endarterectomy    . Colectomy      right hemi  . Cholecystectomy  11/09  . Upper gastrointestinal endoscopy    . Tonsillectomy    . Appendectomy      FAMILY HISTORY Family History  Problem Relation Age of Onset  . Colon cancer Paternal Grandmother   . Esophageal cancer Brother   . Heart disease Mother   . Diabetes Brother   . Hypertension Father   . Rectal cancer Neg Hx   . Stomach cancer Neg Hx     GYNECOLOGIC HISTORY:  No LMP recorded. Patient is postmenopausal.   SOCIAL HISTORY: History   Social History  . Marital Status: Widowed    Spouse Name: N/A    Number of Children: 3  . Years of Education: N/A  Occupational History  . retired   .     Social History Main Topics  . Smoking status: Former Smoker -- 0.50 packs/day for 70 years    Types: Cigarettes    Start date: 02/14/1951    Quit date: 01/03/2014  . Smokeless tobacco: Never Used     Comment: Still using  Electronic cigarette  . Alcohol Use: No  . Drug Use: No  . Sexual Activity: No   Other Topics Concern  . Not on file   Social History Narrative   Patient signed a Designated Party Release to allow her daughter, Louisa Favaro, to have access to her medical records/information. Entered by Fleet Contras March 24,2011 @ 2:47 pm   Dailly Caffeine Use    ADVANCED DIRECTIVES:  <no information>  HEALTH MAINTENANCE: History  Substance Use Topics  . Smoking status: Former Smoker -- 0.50 packs/day for 70 years    Types: Cigarettes     Start date: 02/14/1951    Quit date: 01/03/2014  . Smokeless tobacco: Never Used     Comment: Still using  Electronic cigarette  . Alcohol Use: No   Colonoscopy: PAP: Bone density: Lipid panel:  Allergies  Allergen Reactions  . Bactrim [Sulfamethoxazole-Trimethoprim] Shortness Of Breath  . Levofloxacin Other (See Comments)    seizure  . Pneumovax [Pneumococcal Polysaccharide Vaccine] Other (See Comments)    Severe allergy-cellulitis  . Amoxicillin-Pot Clavulanate Other (See Comments)    Patient stated Drs. recommend taking Augmentin due to her PCN allergy.  . Penicillins Rash  . Bupropion   . Haldol [Haloperidol Lactate]     Had unknown reaction many yrs ago per pt  . Haloperidol   . Morphine   . Morphine And Related     Confused and agitation  . Pneumococcal Vaccine   . Pseudoephedrine Hcl   . Sudafed [Pseudoephedrine Hcl] Other (See Comments)    seizure    Current Outpatient Prescriptions  Medication Sig Dispense Refill  . albuterol (PROAIR HFA) 108 (90 BASE) MCG/ACT inhaler Inhale 2 puffs into the lungs every 4 (four) hours as needed for wheezing or shortness of breath.    Marland Kitchen albuterol (PROVENTIL) (2.5 MG/3ML) 0.083% nebulizer solution Take 2.5 mg by nebulization every 4 (four) hours as needed for wheezing or shortness of breath.    . cholestyramine (QUESTRAN) 4 G packet Take 4 g by mouth daily as needed (diarrhea).     . clonazePAM (KLONOPIN) 1 MG tablet Take 1 mg by mouth 2 (two) times daily as needed for anxiety.     . clopidogrel (PLAVIX) 75 MG tablet TAKE ONE (1) TABLET EACH DAY 90 tablet 0  . cyanocobalamin 500 MCG tablet Take 500 mcg by mouth daily.    . ferrous sulfate 325 (65 FE) MG tablet Take 650 mg by mouth daily after breakfast.    . fluticasone (FLONASE) 50 MCG/ACT nasal spray Place 2 sprays into both nostrils daily. 16 g 6  . Fluticasone-Salmeterol (ADVAIR) 250-50 MCG/DOSE AEPB Inhale 1 puff into the lungs 2 (two) times daily. 1 each 6  . Multiple Vitamin  (MULTIVITAMIN WITH MINERALS) TABS tablet Take 1 tablet by mouth daily.    . Multiple Vitamins-Minerals (OCUVITE PRESERVISION) TABS Take by mouth 2 (two) times daily.    . ondansetron (ZOFRAN) 4 MG tablet Take 1 tablet (4 mg total) by mouth every 6 (six) hours as needed for nausea or vomiting. 30 tablet 1  . Polyethyl Glycol-Propyl Glycol (SYSTANE) 0.4-0.3 % SOLN Place 1 drop into both eyes  2 (two) times daily as needed.    . potassium chloride (K-DUR) 10 MEQ tablet Take 10 mEq by mouth 2 (two) times daily.    Marland Kitchen saccharomyces boulardii (FLORASTOR) 250 MG capsule Take 250 mg by mouth daily.    . traMADol (ULTRAM) 50 MG tablet TAKE ONE (1) TABLET EVERY 6 HOURS AS NEEDED FOR PAIN (MODERATE PAIN) 60 tablet 0   No current facility-administered medications for this visit.    OBJECTIVE: Filed Vitals:   08/16/14 1134  BP: 157/67  Pulse: 90  Temp: 97.8 F (36.6 C)  Resp: 16   Filed Weights   08/16/14 1134  Weight: 134 lb (60.782 kg)   ECOG FS:0 - Asymptomatic Ocular: Sclerae unicteric, pupils equal, round and reactive to light Ear-nose-throat: Oropharynx clear, dentition fair Lymphatic: No cervical or supraclavicular adenopathy Lungs no rales or rhonchi, good excursion bilaterally Heart regular rate and rhythm, no murmur appreciated Abd soft, nontender, positive bowel sounds MSK no focal spinal tenderness, no joint edema Neuro: non-focal, well-oriented, appropriate affect Breasts: Deferred  LAB RESULTS: CMP     Component Value Date/Time   NA 139 08/03/2014 1158   NA 141 06/15/2013 1458   K 3.5 08/03/2014 1158   K 4.3 06/15/2013 1458   CL 102 08/03/2014 1158   CL 104 06/15/2013 1458   CO2 26 08/03/2014 1158   CO2 27 06/15/2013 1458   GLUCOSE 258* 08/03/2014 1158   GLUCOSE 179* 06/15/2013 1458   BUN 9 08/03/2014 1158   BUN 10 06/15/2013 1458   CREATININE 0.76 08/03/2014 1158   CREATININE 0.69 06/29/2014 0119   CALCIUM 8.7 08/03/2014 1158   CALCIUM 8.9 06/15/2013 1458   PROT  6.8 08/03/2014 1158   PROT 8.0 06/27/2010 0809   ALBUMIN 3.5 08/03/2014 1158   AST 29 08/03/2014 1158   AST 52* 06/27/2010 0809   ALT 11 08/03/2014 1158   ALT 26 06/27/2010 0809   ALKPHOS 118* 08/03/2014 1158   ALKPHOS 74 06/27/2010 0809   BILITOT 0.5 08/03/2014 1158   BILITOT 0.70 06/27/2010 0809   GFRNONAA 79* 06/29/2014 0119   GFRNONAA 60 05/16/2014 1446   GFRAA >90 06/29/2014 0119   GFRAA 69 05/16/2014 1446   INo results found for: SPEP, UPEP Lab Results  Component Value Date   WBC 7.2 08/16/2014   NEUTROABS 4.7 08/16/2014   HGB 12.0 08/16/2014   HCT 36.8 08/16/2014   MCV 87 08/16/2014   PLT 131* 08/16/2014   No results found for: LABCA2 No components found for: LABCA125  Recent Labs Lab 08/16/14 1120  INR 1.1*   STUDIES: No results found.  ASSESSMENT/PLAN: Monique Fleming is a 80 year old female with a history of stage I adenocarcinoma of the right colon.She had a resection in May 2008. She has shown no evidence of recurrence. She is asymptomatic at this time.  Her CBC today is normal. We will see what her iron studies show.  I do not think we need to schedule a follow-up with the patient but just see her as need. She is in agreement with this.  She knows to call here with any questions or concerns and to go to the ED in the event of an emergency. We can certainly see her anytime she would need [place].   Eliezer Bottom, NP 08/16/2014 1:18 PM

## 2014-08-17 LAB — COMPREHENSIVE METABOLIC PANEL
ALK PHOS: 114 U/L (ref 39–117)
ALT: 10 U/L (ref 0–35)
AST: 24 U/L (ref 0–37)
Albumin: 3.5 g/dL (ref 3.5–5.2)
BUN: 8 mg/dL (ref 6–23)
CALCIUM: 8.6 mg/dL (ref 8.4–10.5)
CO2: 29 mEq/L (ref 19–32)
CREATININE: 0.75 mg/dL (ref 0.50–1.10)
Chloride: 103 mEq/L (ref 96–112)
GLUCOSE: 246 mg/dL — AB (ref 70–99)
Potassium: 3.5 mEq/L (ref 3.5–5.3)
Sodium: 140 mEq/L (ref 135–145)
Total Bilirubin: 0.4 mg/dL (ref 0.2–1.2)
Total Protein: 6.8 g/dL (ref 6.0–8.3)

## 2014-08-17 LAB — CEA: CEA: 8.8 ng/mL — AB (ref 0.0–5.0)

## 2014-08-17 LAB — LACTATE DEHYDROGENASE: LDH: 176 U/L (ref 94–250)

## 2014-08-21 ENCOUNTER — Emergency Department (HOSPITAL_BASED_OUTPATIENT_CLINIC_OR_DEPARTMENT_OTHER): Payer: Medicare Other

## 2014-08-21 ENCOUNTER — Emergency Department (HOSPITAL_BASED_OUTPATIENT_CLINIC_OR_DEPARTMENT_OTHER)
Admission: EM | Admit: 2014-08-21 | Discharge: 2014-08-21 | Disposition: A | Discharge status: Home / Self Care | Payer: Medicare Other | Attending: Emergency Medicine | Admitting: Emergency Medicine

## 2014-08-21 ENCOUNTER — Telehealth: Payer: Self-pay | Admitting: Internal Medicine

## 2014-08-21 ENCOUNTER — Encounter (HOSPITAL_BASED_OUTPATIENT_CLINIC_OR_DEPARTMENT_OTHER): Payer: Self-pay | Admitting: *Deleted

## 2014-08-21 ENCOUNTER — Telehealth: Payer: Self-pay | Admitting: Family Medicine

## 2014-08-21 DIAGNOSIS — Z79899 Other long term (current) drug therapy: Secondary | ICD-10-CM | POA: Diagnosis not present

## 2014-08-21 DIAGNOSIS — Z880 Allergy status to penicillin: Secondary | ICD-10-CM | POA: Insufficient documentation

## 2014-08-21 DIAGNOSIS — Z8619 Personal history of other infectious and parasitic diseases: Secondary | ICD-10-CM | POA: Diagnosis not present

## 2014-08-21 DIAGNOSIS — Z859 Personal history of malignant neoplasm, unspecified: Secondary | ICD-10-CM | POA: Diagnosis not present

## 2014-08-21 DIAGNOSIS — Z7902 Long term (current) use of antithrombotics/antiplatelets: Secondary | ICD-10-CM | POA: Diagnosis not present

## 2014-08-21 DIAGNOSIS — Z87891 Personal history of nicotine dependence: Secondary | ICD-10-CM | POA: Insufficient documentation

## 2014-08-21 DIAGNOSIS — Z8679 Personal history of other diseases of the circulatory system: Secondary | ICD-10-CM | POA: Diagnosis not present

## 2014-08-21 DIAGNOSIS — K589 Irritable bowel syndrome without diarrhea: Secondary | ICD-10-CM | POA: Insufficient documentation

## 2014-08-21 DIAGNOSIS — Z8673 Personal history of transient ischemic attack (TIA), and cerebral infarction without residual deficits: Secondary | ICD-10-CM | POA: Insufficient documentation

## 2014-08-21 DIAGNOSIS — E119 Type 2 diabetes mellitus without complications: Secondary | ICD-10-CM | POA: Diagnosis not present

## 2014-08-21 DIAGNOSIS — Z7951 Long term (current) use of inhaled steroids: Secondary | ICD-10-CM | POA: Insufficient documentation

## 2014-08-21 DIAGNOSIS — Z872 Personal history of diseases of the skin and subcutaneous tissue: Secondary | ICD-10-CM | POA: Diagnosis not present

## 2014-08-21 DIAGNOSIS — F329 Major depressive disorder, single episode, unspecified: Secondary | ICD-10-CM | POA: Insufficient documentation

## 2014-08-21 DIAGNOSIS — F419 Anxiety disorder, unspecified: Secondary | ICD-10-CM | POA: Insufficient documentation

## 2014-08-21 DIAGNOSIS — K648 Other hemorrhoids: Secondary | ICD-10-CM | POA: Diagnosis not present

## 2014-08-21 DIAGNOSIS — Z8739 Personal history of other diseases of the musculoskeletal system and connective tissue: Secondary | ICD-10-CM | POA: Insufficient documentation

## 2014-08-21 DIAGNOSIS — Q6100 Congenital renal cyst, unspecified: Secondary | ICD-10-CM | POA: Diagnosis not present

## 2014-08-21 DIAGNOSIS — D696 Thrombocytopenia, unspecified: Secondary | ICD-10-CM | POA: Insufficient documentation

## 2014-08-21 DIAGNOSIS — J45909 Unspecified asthma, uncomplicated: Secondary | ICD-10-CM | POA: Insufficient documentation

## 2014-08-21 DIAGNOSIS — R109 Unspecified abdominal pain: Secondary | ICD-10-CM | POA: Insufficient documentation

## 2014-08-21 DIAGNOSIS — K625 Hemorrhage of anus and rectum: Secondary | ICD-10-CM

## 2014-08-21 DIAGNOSIS — Z8669 Personal history of other diseases of the nervous system and sense organs: Secondary | ICD-10-CM | POA: Insufficient documentation

## 2014-08-21 DIAGNOSIS — J441 Chronic obstructive pulmonary disease with (acute) exacerbation: Secondary | ICD-10-CM | POA: Diagnosis not present

## 2014-08-21 HISTORY — DX: Unspecified asthma, uncomplicated: J45.909

## 2014-08-21 LAB — CBC WITH DIFFERENTIAL/PLATELET
BASOS ABS: 0.1 10*3/uL (ref 0.0–0.1)
BASOS PCT: 1 % (ref 0–1)
EOS ABS: 0.4 10*3/uL (ref 0.0–0.7)
Eosinophils Relative: 6 % — ABNORMAL HIGH (ref 0–5)
HEMATOCRIT: 35.4 % — AB (ref 36.0–46.0)
Hemoglobin: 11.4 g/dL — ABNORMAL LOW (ref 12.0–15.0)
Lymphocytes Relative: 22 % (ref 12–46)
Lymphs Abs: 1.3 10*3/uL (ref 0.7–4.0)
MCH: 28 pg (ref 26.0–34.0)
MCHC: 32.2 g/dL (ref 30.0–36.0)
MCV: 87 fL (ref 78.0–100.0)
Monocytes Absolute: 0.5 10*3/uL (ref 0.1–1.0)
Monocytes Relative: 8 % (ref 3–12)
Neutro Abs: 3.6 10*3/uL (ref 1.7–7.7)
Neutrophils Relative %: 63 % (ref 43–77)
PLATELETS: 113 10*3/uL — AB (ref 150–400)
RBC: 4.07 MIL/uL (ref 3.87–5.11)
RDW: 14.9 % (ref 11.5–15.5)
WBC: 5.8 10*3/uL (ref 4.0–10.5)

## 2014-08-21 LAB — COMPREHENSIVE METABOLIC PANEL
ALBUMIN: 2.9 g/dL — AB (ref 3.5–5.2)
ALK PHOS: 117 U/L (ref 39–117)
ALT: 11 U/L (ref 0–35)
AST: 30 U/L (ref 0–37)
Anion gap: 14 (ref 5–15)
BILIRUBIN TOTAL: 0.3 mg/dL (ref 0.3–1.2)
BUN: 9 mg/dL (ref 6–23)
CHLORIDE: 107 meq/L (ref 96–112)
CO2: 26 meq/L (ref 19–32)
CREATININE: 0.7 mg/dL (ref 0.50–1.10)
Calcium: 8.7 mg/dL (ref 8.4–10.5)
GFR calc Af Amer: >90 mL/min (ref >90)
GFR, EST NON AFRICAN AMERICAN: 79 mL/min — AB (ref >90)
Glucose, Bld: 236 mg/dL — ABNORMAL HIGH (ref 70–99)
POTASSIUM: 3.5 meq/L — AB (ref 3.7–5.3)
Sodium: 147 mEq/L (ref 137–147)
Total Protein: 6.9 g/dL (ref 6.0–8.3)

## 2014-08-21 LAB — OCCULT BLOOD X 1 CARD TO LAB, STOOL: Fecal Occult Bld: NEGATIVE

## 2014-08-21 MED ORDER — IOHEXOL 300 MG/ML  SOLN
100.0000 mL | Freq: Once | INTRAMUSCULAR | Status: AC | PRN
  Administered 2014-08-21: 100 mL via INTRAVENOUS

## 2014-08-21 MED ORDER — IOHEXOL 300 MG/ML  SOLN
50.0000 mL | Freq: Once | INTRAMUSCULAR | Status: AC | PRN
  Administered 2014-08-21: 50 mL via ORAL

## 2014-08-21 MED ORDER — ALBUTEROL SULFATE HFA 108 (90 BASE) MCG/ACT IN AERS
2.0000 | INHALATION_SPRAY | Freq: Once | RESPIRATORY_TRACT | Status: AC
  Administered 2014-08-21: 2 via RESPIRATORY_TRACT
  Filled 2014-08-21: qty 6.7

## 2014-08-21 NOTE — Discharge Instructions (Signed)
Bloody Stools  Bloody stools often mean that there is a problem in the digestive tract. Your caregiver may use the term "melena" to describe black, tarry, and bad smelling stools or "hematochezia" to describe red or maroon-colored stools. Blood seen in the stool can be caused by bleeding anywhere along the intestinal tract.   A black stool usually means that blood is coming from the upper part of the gastrointestinal tract (esophagus, stomach, or small bowel). Passing maroon-colored stools or bright red blood usually means that blood is coming from lower down in the large bowel or the rectum. However, sometimes massive bleeding in the stomach or small intestine can cause bright red bloody stools.   Consuming black licorice, lead, iron pills, medicines containing bismuth subsalicylate, or blueberries can also cause black stools. Your caregiver can test black stools to see if blood is present.  It is important that the cause of the bleeding be found. Treatment can then be started, and the problem can be corrected. Rectal bleeding may not be serious, but you should not assume everything is okay until you know the cause. It is very important to follow up with your caregiver or a specialist in gastrointestinal problems.  CAUSES   Blood in the stools can come from various underlying causes. Often, the cause is not found during your first visit. Testing is often needed to discover the cause of bleeding in the gastrointestinal tract. Causes range from simple to serious or even life-threatening. Possible causes include:  · Hemorrhoids. These are veins that are full of blood (engorged) in the rectum. They cause pain, inflammation, and may bleed.  · Anal fissures. These are areas of painful tearing which may bleed. They are often caused by passing hard stool.  · Diverticulosis. These are pouches that form on the colon over time, with age, and may bleed significantly.  · Diverticulitis. This is inflammation in areas with  diverticulosis. It can cause pain, fever, and bloody stools, although bleeding is rare.  · Proctitis and colitis. These are inflamed areas of the rectum or colon. They may cause pain, fever, and bloody stools.  · Polyps and cancer. Colon cancer is a leading cause of preventable cancer death. It often starts out as precancerous polyps that can be removed during a colonoscopy, preventing progression into cancer. Sometimes, polyps and cancer may cause rectal bleeding.  · Gastritis and ulcers. Bleeding from the upper gastrointestinal tract (near the stomach) may travel through the intestines and produce black, sometimes tarry, often bad smelling stools. In certain cases, if the bleeding is fast enough, the stools may not be black, but red and the condition may be life-threatening.  SYMPTOMS   You may have stools that are bright red and bloody, that are normal color with blood on them, or that are dark black and tarry. In some cases, you may only have blood in the toilet bowl. Any of these cases need medical care. You may also have:  · Pain at the anus or anywhere in the rectum.  · Lightheadedness or feeling faint.  · Extreme weakness.  · Nausea or vomiting.  · Fever.  DIAGNOSIS  Your caregiver may use the following methods to find the cause of your bleeding:  · Taking a medical history. Age is important. Older people tend to develop polyps and cancer more often. If there is anal pain and a hard, large stool associated with bleeding, a tear of the anus may be the cause. If blood drips into the toilet after a bowel movement, bleeding hemorrhoids may be the   problem. The color and frequency of the bleeding are additional considerations. In most cases, the medical history provides clues, but seldom the final answer.  · A visual and finger (digital) exam. Your caregiver will inspect the anal area, looking for tears and hemorrhoids. A finger exam can provide information when there is tenderness or a growth inside. In men, the  prostate is also examined.  · Endoscopy. Several types of small, long scopes (endoscopes) are used to view the colon.  ¨ In the office, your caregiver may use a rigid, or more commonly, a flexible viewing sigmoidoscope. This exam is called flexible sigmoidoscopy. It is performed in 5 to 10 minutes.  ¨ A more thorough exam is accomplished with a colonoscope. It allows your caregiver to view the entire 5 to 6 foot long colon. Medicine to help you relax (sedative) is usually given for this exam. Frequently, a bleeding lesion may be present beyond the reach of the sigmoidoscope. So, a colonoscopy may be the best exam to start with. Both exams are usually done on an outpatient basis. This means the patient does not stay overnight in the hospital or surgery center.  ¨ An upper endoscopy may be needed to examine your stomach. Sedation is used and a flexible endoscope is put in your mouth, down to your stomach.  · A barium enema X-ray. This is an X-ray exam. It uses liquid barium inserted by enema into the rectum. This test alone may not identify an actual bleeding point. X-rays highlight abnormal shadows, such as those made by lumps (tumors), diverticuli, or colitis.  TREATMENT   Treatment depends on the cause of your bleeding.   · For bleeding from the stomach or colon, the caregiver doing your endoscopy or colonoscopy may be able to stop the bleeding as part of the procedure.  · Inflammation or infection of the colon can be treated with medicines.  · Many rectal problems can be treated with creams, suppositories, or warm baths.  · Surgery is sometimes needed.  · Blood transfusions are sometimes needed if you have lost a lot of blood.  · For any bleeding problem, let your caregiver know if you take aspirin or other blood thinners regularly.  HOME CARE INSTRUCTIONS   · Take any medicines exactly as prescribed.  · Keep your stools soft by eating a diet high in fiber. Prunes (1 to 3 a day) work well for many people.  · Drink  enough water and fluids to keep your urine clear or pale yellow.  · Take sitz baths if advised. A sitz bath is when you sit in a bathtub with warm water for 10 to 15 minutes to soak, soothe, and cleanse the rectal area.  · If enemas or suppositories are advised, be sure you know how to use them. Tell your caregiver if you have problems with this.  · Monitor your bowel movements to look for signs of improvement or worsening.  SEEK MEDICAL CARE IF:   · You do not improve in the time expected.  · Your condition worsens after initial improvement.  · You develop any new symptoms.  SEEK IMMEDIATE MEDICAL CARE IF:   · You develop severe or prolonged rectal bleeding.  · You vomit blood.  · You feel weak or faint.  · You have a fever.  MAKE SURE YOU:  · Understand these instructions.  · Will watch your condition.  · Will get help right away if you are not doing well or get worse.    Document Released: 09/11/2002 Document Revised: 12/14/2011 Document Reviewed: 02/06/2011  [employer]® Patient Information ©2015 [employer]. This information is not intended to replace advice given to you by your health care provider. Make sure you discuss any questions you have with your health care provider.

## 2014-08-21 NOTE — ED Provider Notes (Signed)
CSN: [id number]     Arrival date & time 08/21/14  1705 History  This chart was scribed for Debby Freiberg, MD by Randa Evens, ED Scribe. This patient was seen in room MH12/MH12 and the patient's care was started at [place] PM.      Chief Complaint  Patient presents with  . Rectal Bleeding   Patient is a 80 y.o. female presenting with hematochezia. The history is provided by the patient. No language interpreter was used.  Rectal Bleeding Quality:  Bright red Amount:  Moderate Duration:  3 days Timing:  Intermittent Chronicity:  Recurrent Context: hemorrhoids   Similar prior episodes: yes   Relieved by:  None tried Worsened by:  Nothing tried Ineffective treatments:  None tried Associated symptoms: fever   Associated symptoms: no abdominal pain and no vomiting    HPI Comments: BEONKA AMESQUITA is a 80 y.o. female who presents to the Emergency Department complaining of recurrent intermittent rectal bleeding onset 3 days ago. She states she has had subjective fever and chills. She describes the her stools having bright red blood present, at times painful. She states she has a Hx of hemorrhoids. She states she is not able to have surgical removal of the hemorrhoids due to Cirrhosis of liver. She denies nausea or vomiting.   Past Medical History  Diagnosis Date  . Aortic stenosis     moderate by echo 6/11 gradient 20/75mmHg for mean and peak  . TIA (transient ischemic attack)     Dr. [name]  . Peripheral vascular disease   . IBS (irritable bowel syndrome)   . Diabetes mellitus     type II  . Chronic bronchitis   . COPD (chronic obstructive pulmonary disease)   . Atherosclerosis   . Depression   . Anxiety     Dr Casimiro Needle  . Renal cyst   . Anemia     iron deficiency  . Cellulitis   . Diverticular disease   . Infectious diarrhea(009.2)   . Sinusitis   . Thrush   . Abnormal liver function test   . GERD (gastroesophageal reflux disease)   . Insomnia     chronic  .  Adenocarcinoma 2008    colon,s/p right hemicolectomy  . Angina   . Seizures     hx of one seizure "  . Cirrhosis of liver     hx of  . Clostridium difficile diarrhea   . Thrombocytopenia 04/01/2013  . Bursitis     spine per pt  . Macular degeneration 02/18/2014    Follows with [facility]  . Internal hemorrhoids    Past Surgical History  Procedure Laterality Date  . Carotid endarterectomy    . Colectomy      right hemi  . Cholecystectomy  11/09  . Upper gastrointestinal endoscopy    . Tonsillectomy    . Appendectomy     Family History  Problem Relation Age of Onset  . Colon cancer Paternal Grandmother   . Esophageal cancer Brother   . Heart disease Mother   . Diabetes Brother   . Hypertension Father   . Rectal cancer Neg Hx   . Stomach cancer Neg Hx    History  Substance Use Topics  . Smoking status: Former Smoker -- 0.50 packs/day for 70 years    Types: Cigarettes    Start date: 02/14/1951    Quit date: 01/03/2014  . Smokeless tobacco: Never Used     Comment: Still using  Electronic cigarette  .  Alcohol Use: No   OB History    No data available     Review of Systems  Constitutional: Positive for fever and chills.  Gastrointestinal: Positive for hematochezia. Negative for nausea, vomiting and abdominal pain.  All other systems reviewed and are negative.   Allergies  Bactrim; Levofloxacin; Pneumovax; Amoxicillin-pot clavulanate; Penicillins; Bupropion; Haldol; Haloperidol; Morphine; Morphine and related; Pneumococcal vaccine; Pseudoephedrine hcl; and Sudafed  Home Medications   Prior to Admission medications   Medication Sig Start Date End Date Taking? Authorizing Provider  albuterol (PROAIR HFA) 108 (90 BASE) MCG/ACT inhaler Inhale 2 puffs into the lungs every 4 (four) hours as needed for wheezing or shortness of breath.    Historical Provider, MD  albuterol (PROVENTIL) (2.5 MG/3ML) 0.083% nebulizer solution Take 2.5 mg by nebulization every 4 (four) hours  as needed for wheezing or shortness of breath. 03/22/14   Mosie Lukes, MD  cholestyramine Lucrezia Starch) 4 G packet Take 4 g by mouth daily as needed (diarrhea).     Historical Provider, MD  clonazePAM (KLONOPIN) 1 MG tablet Take 1 mg by mouth 2 (two) times daily as needed for anxiety.     Historical Provider, MD  clopidogrel (PLAVIX) 75 MG tablet TAKE ONE (1) TABLET EACH DAY 07/30/14   Mosie Lukes, MD  cyanocobalamin 500 MCG tablet Take 500 mcg by mouth daily.    Historical Provider, MD  ferrous sulfate 325 (65 FE) MG tablet Take 650 mg by mouth daily after breakfast. 11/13/13   Debbrah Alar, NP  fluticasone (FLONASE) 50 MCG/ACT nasal spray Place 2 sprays into both nostrils daily. 08/03/14   Elsie Stain, MD  Fluticasone-Salmeterol (ADVAIR) 250-50 MCG/DOSE AEPB Inhale 1 puff into the lungs 2 (two) times daily. 04/05/14   Elsie Stain, MD  Multiple Vitamin (MULTIVITAMIN WITH MINERALS) TABS tablet Take 1 tablet by mouth daily.    Historical Provider, MD  Multiple Vitamins-Minerals (OCUVITE PRESERVISION) TABS Take by mouth 2 (two) times daily.    Historical Provider, MD  ondansetron (ZOFRAN) 4 MG tablet Take 1 tablet (4 mg total) by mouth every 6 (six) hours as needed for nausea or vomiting. 01/23/14   Willia Craze, NP  Polyethyl Glycol-Propyl Glycol (SYSTANE) 0.4-0.3 % SOLN Place 1 drop into both eyes 2 (two) times daily as needed.    Historical Provider, MD  potassium chloride (K-DUR) 10 MEQ tablet Take 10 mEq by mouth 2 (two) times daily.    Historical Provider, MD  saccharomyces boulardii (FLORASTOR) 250 MG capsule Take 250 mg by mouth daily.    Historical Provider, MD  traMADol (ULTRAM) 50 MG tablet TAKE ONE (1) TABLET EVERY 6 HOURS AS NEEDED FOR PAIN (MODERATE PAIN) 07/12/14   Mosie Lukes, MD   Triage Vitals: BP 135/52 mmHg  Pulse 95  Ht 5' 5.5" (1.664 m)  Wt 133 lb (60.328 kg)  BMI 21.79 kg/m2  SpO2 97%  Physical Exam  Constitutional: She is oriented to person, place, and  time. She appears well-developed and well-nourished. No distress.  HENT:  Head: Normocephalic and atraumatic.  Right Ear: External ear normal.  Left Ear: External ear normal.  Eyes: Conjunctivae and EOM are normal. Pupils are equal, round, and reactive to light.  Neck: Normal range of motion. Neck supple. No tracheal deviation present.  Cardiovascular: Normal rate, regular rhythm, normal heart sounds and intact distal pulses.   Pulmonary/Chest: Effort normal and breath sounds normal. No respiratory distress.  Lungs clear to ausculation   Abdominal: Soft. Bowel  sounds are normal. There is no tenderness.  Genitourinary:  Old healed anal fissures, 2 large non thrombosis anal hemorrhoids. Internal hemorrhoids also present.  Musculoskeletal: Normal range of motion.  Neurological: She is alert and oriented to person, place, and time.  Skin: Skin is warm and dry.  Psychiatric: She has a normal mood and affect. Her behavior is normal.  Nursing note and vitals reviewed.   ED Course  Procedures (including critical care time) DIAGNOSTIC STUDIES: Oxygen Saturation is 97% on RA, normal by my interpretation.    COORDINATION OF CARE: 6:21 PM-Discussed treatment plan which includes rectal exam, blood work and CT of abdomen with pt at bedside and pt agreed to plan.     Labs Review Labs Reviewed - No data to display  Imaging Review No results found.   EKG Interpretation None      MDM   Final diagnoses:  None        80 y.o. female with pertinent PMH of cirrhosis, hemorrhoids, prior rectal bleeding presents with recurrent rectal bleeding as described above. Patient states his symptoms are worsened over the past 3 days. She systemically endorses some malaise and chills, however is otherwise well.  No upper GI symptoms. No fever. On arrival today vitals signs and physical exam as above. Patient has both internal and external hemorrhoids. This is likely the etiology for rectal bleeding.  Labs obtained and with stable hemoglobin.  No historical elements of other bleeding.  CT scan unremarkable for acute pathology.  Will have pt fu with PCP for Hgb recheck.  DC home in stable condition.    1. Rectal bleeding   2. Abdominal pain   3. Internal hemorrhoid           Debby Freiberg, MD 08/21/14 2317

## 2014-08-21 NOTE — Telephone Encounter (Signed)
Patient Information:  Caller Name: Keiyana  Phone: [phone]  Patient: Monique Fleming  Gender: Female  DOB: 01/20/1929  Age: 80 Years  PCP: Penni Homans [facility])  Office Follow Up:  Does the office need to follow up with this patient?: Yes  Instructions For The Office: Please review notes - should appointment be moved up to today? or should patient go to ER? or is it okay for her to wait until tomorrow afternoon? Please reach patient at  [phone].  RN Note:  Patient says wanting to just go back to bed and rest until appointment tomorrow instead of coming to office today 11/17 or to ER.  Symptoms  Reason For Call & Symptoms: Has had blood in stools for 3-4 months.  Blood in stools increasing since Saturday 11/14.    Blood in stools now bright red and has happened  4-5 times in the last  24 hours.  Patient expresses concern about her platelets being low and that she might need to have something done about that.  Has appointment tomorrow 11/18 at 3:30 pm to recheck swollen lump on neck.  Reviewed Health History In EMR: Yes  Reviewed Medications In EMR: Yes  Reviewed Allergies In EMR: Yes  Reviewed Surgeries / Procedures: Yes  Date of Onset of Symptoms: 08/18/2014  Guideline(s) Used:  Rectal Bleeding  Disposition Per Guideline:   Go to ED Now (or to Office with PCP Approval)  Reason For Disposition Reached:   Bloody, black, or tarry bowel movements  Advice Given:  N/A  Patient Refused Recommendation:  Patient Will Follow Up With Office Later  Patient wanting to just come to office tomorrow 11/18 for follow up and to deal with red rectal bleeding.

## 2014-08-21 NOTE — Telephone Encounter (Signed)
I agree she needs to be seen.

## 2014-08-21 NOTE — Telephone Encounter (Signed)
Patient states that she is still passing blood. She is calling Dr Blanch Media office to inquire what they would have her do.  Patient disconnected call.

## 2014-08-21 NOTE — ED Notes (Signed)
Rectal bleeding since Saturday "every time I go to the bathroom"- Pt reports she drove herself here

## 2014-08-21 NOTE — Telephone Encounter (Signed)
Pt states that she has been having some rectal bleeding since Saturday but this afternoon she had BRB just pour out of her rectum and fill the toilet. Pt instructed to go to the ER to be evaluated. Pt verbalized understanding.

## 2014-08-21 NOTE — ED Notes (Signed)
Daughter, [name], at bedside.

## 2014-08-22 ENCOUNTER — Ambulatory Visit: Payer: Medicare Other | Admitting: Physician Assistant

## 2014-08-22 ENCOUNTER — Observation Stay (HOSPITAL_COMMUNITY)
Admission: EM | Admit: 2014-08-22 | Discharge: 2014-08-24 | Disposition: A | Discharge status: Home / Self Care | Payer: Medicare Other | Attending: Internal Medicine | Admitting: Internal Medicine

## 2014-08-22 ENCOUNTER — Encounter (HOSPITAL_COMMUNITY): Payer: Self-pay | Admitting: Emergency Medicine

## 2014-08-22 ENCOUNTER — Emergency Department (HOSPITAL_COMMUNITY): Payer: Medicare Other

## 2014-08-22 DIAGNOSIS — K219 Gastro-esophageal reflux disease without esophagitis: Secondary | ICD-10-CM | POA: Diagnosis not present

## 2014-08-22 DIAGNOSIS — I7090 Unspecified atherosclerosis: Secondary | ICD-10-CM | POA: Insufficient documentation

## 2014-08-22 DIAGNOSIS — Z8673 Personal history of transient ischemic attack (TIA), and cerebral infarction without residual deficits: Secondary | ICD-10-CM | POA: Diagnosis not present

## 2014-08-22 DIAGNOSIS — Z880 Allergy status to penicillin: Secondary | ICD-10-CM | POA: Diagnosis not present

## 2014-08-22 DIAGNOSIS — K649 Unspecified hemorrhoids: Secondary | ICD-10-CM | POA: Diagnosis present

## 2014-08-22 DIAGNOSIS — R079 Chest pain, unspecified: Secondary | ICD-10-CM | POA: Insufficient documentation

## 2014-08-22 DIAGNOSIS — F09 Unspecified mental disorder due to known physiological condition: Secondary | ICD-10-CM | POA: Diagnosis present

## 2014-08-22 DIAGNOSIS — Z79899 Other long term (current) drug therapy: Secondary | ICD-10-CM | POA: Diagnosis not present

## 2014-08-22 DIAGNOSIS — I739 Peripheral vascular disease, unspecified: Secondary | ICD-10-CM | POA: Insufficient documentation

## 2014-08-22 DIAGNOSIS — Z885 Allergy status to narcotic agent status: Secondary | ICD-10-CM | POA: Insufficient documentation

## 2014-08-22 DIAGNOSIS — R531 Weakness: Secondary | ICD-10-CM

## 2014-08-22 DIAGNOSIS — H3530 Unspecified macular degeneration: Secondary | ICD-10-CM | POA: Insufficient documentation

## 2014-08-22 DIAGNOSIS — Z888 Allergy status to other drugs, medicaments and biological substances status: Secondary | ICD-10-CM | POA: Diagnosis not present

## 2014-08-22 DIAGNOSIS — K7460 Unspecified cirrhosis of liver: Secondary | ICD-10-CM | POA: Insufficient documentation

## 2014-08-22 DIAGNOSIS — R4182 Altered mental status, unspecified: Secondary | ICD-10-CM | POA: Diagnosis not present

## 2014-08-22 DIAGNOSIS — Z881 Allergy status to other antibiotic agents status: Secondary | ICD-10-CM | POA: Insufficient documentation

## 2014-08-22 DIAGNOSIS — R0789 Other chest pain: Secondary | ICD-10-CM | POA: Insufficient documentation

## 2014-08-22 DIAGNOSIS — E119 Type 2 diabetes mellitus without complications: Secondary | ICD-10-CM | POA: Insufficient documentation

## 2014-08-22 DIAGNOSIS — F329 Major depressive disorder, single episode, unspecified: Secondary | ICD-10-CM | POA: Diagnosis not present

## 2014-08-22 DIAGNOSIS — R197 Diarrhea, unspecified: Secondary | ICD-10-CM | POA: Diagnosis present

## 2014-08-22 DIAGNOSIS — J449 Chronic obstructive pulmonary disease, unspecified: Secondary | ICD-10-CM | POA: Diagnosis not present

## 2014-08-22 DIAGNOSIS — I350 Nonrheumatic aortic (valve) stenosis: Secondary | ICD-10-CM | POA: Insufficient documentation

## 2014-08-22 DIAGNOSIS — E860 Dehydration: Secondary | ICD-10-CM | POA: Diagnosis present

## 2014-08-22 DIAGNOSIS — F419 Anxiety disorder, unspecified: Secondary | ICD-10-CM | POA: Diagnosis not present

## 2014-08-22 DIAGNOSIS — K580 Irritable bowel syndrome with diarrhea: Secondary | ICD-10-CM | POA: Diagnosis not present

## 2014-08-22 DIAGNOSIS — Z87891 Personal history of nicotine dependence: Secondary | ICD-10-CM | POA: Insufficient documentation

## 2014-08-22 DIAGNOSIS — Z887 Allergy status to serum and vaccine status: Secondary | ICD-10-CM | POA: Diagnosis not present

## 2014-08-22 DIAGNOSIS — D509 Iron deficiency anemia, unspecified: Secondary | ICD-10-CM | POA: Insufficient documentation

## 2014-08-22 DIAGNOSIS — Z85038 Personal history of other malignant neoplasm of large intestine: Secondary | ICD-10-CM | POA: Insufficient documentation

## 2014-08-22 DIAGNOSIS — G4700 Insomnia, unspecified: Secondary | ICD-10-CM | POA: Insufficient documentation

## 2014-08-22 DIAGNOSIS — K5790 Diverticulosis of intestine, part unspecified, without perforation or abscess without bleeding: Secondary | ICD-10-CM | POA: Insufficient documentation

## 2014-08-22 LAB — RAPID URINE DRUG SCREEN, HOSP PERFORMED
AMPHETAMINES: NOT DETECTED
BENZODIAZEPINES: POSITIVE — AB
Barbiturates: NOT DETECTED
Cocaine: NOT DETECTED
Opiates: NOT DETECTED
Tetrahydrocannabinol: NOT DETECTED

## 2014-08-22 LAB — BASIC METABOLIC PANEL
Anion gap: 11 (ref 5–15)
BUN: 7 mg/dL (ref 6–23)
CHLORIDE: 105 meq/L (ref 96–112)
CO2: 24 mEq/L (ref 19–32)
Calcium: 9 mg/dL (ref 8.4–10.5)
Creatinine, Ser: 0.73 mg/dL (ref 0.50–1.10)
GFR calc non Af Amer: 77 mL/min — ABNORMAL LOW (ref >90)
GFR, EST AFRICAN AMERICAN: 90 mL/min — AB (ref >90)
Glucose, Bld: 168 mg/dL — ABNORMAL HIGH (ref 70–99)
POTASSIUM: 4.7 meq/L (ref 3.7–5.3)
Sodium: 140 mEq/L (ref 137–147)

## 2014-08-22 LAB — TSH: TSH: 2.42 u[IU]/mL (ref 0.350–4.500)

## 2014-08-22 LAB — URINALYSIS, ROUTINE W REFLEX MICROSCOPIC
Bilirubin Urine: NEGATIVE
Glucose, UA: NEGATIVE mg/dL
Hgb urine dipstick: NEGATIVE
Ketones, ur: NEGATIVE mg/dL
LEUKOCYTES UA: NEGATIVE
NITRITE: NEGATIVE
Protein, ur: NEGATIVE mg/dL
SPECIFIC GRAVITY, URINE: 1.012 (ref 1.005–1.030)
Urobilinogen, UA: 0.2 mg/dL (ref 0.0–1.0)
pH: 6.5 (ref 5.0–8.0)

## 2014-08-22 LAB — CBC WITH DIFFERENTIAL/PLATELET
BASOS PCT: 1 % (ref 0–1)
Basophils Absolute: 0.1 10*3/uL (ref 0.0–0.1)
EOS ABS: 0.5 10*3/uL (ref 0.0–0.7)
Eosinophils Relative: 8 % — ABNORMAL HIGH (ref 0–5)
HEMATOCRIT: 36.5 % (ref 36.0–46.0)
HEMOGLOBIN: 11.7 g/dL — AB (ref 12.0–15.0)
LYMPHS ABS: 1.1 10*3/uL (ref 0.7–4.0)
Lymphocytes Relative: 19 % (ref 12–46)
MCH: 27.9 pg (ref 26.0–34.0)
MCHC: 32.1 g/dL (ref 30.0–36.0)
MCV: 86.9 fL (ref 78.0–100.0)
MONO ABS: 0.5 10*3/uL (ref 0.1–1.0)
MONOS PCT: 8 % (ref 3–12)
NEUTROS ABS: 3.8 10*3/uL (ref 1.7–7.7)
NEUTROS PCT: 64 % (ref 43–77)
Platelets: 122 10*3/uL — ABNORMAL LOW (ref 150–400)
RBC: 4.2 MIL/uL (ref 3.87–5.11)
RDW: 15.2 % (ref 11.5–15.5)
WBC: 5.9 10*3/uL (ref 4.0–10.5)

## 2014-08-22 LAB — ACETAMINOPHEN LEVEL: Acetaminophen (Tylenol), Serum: <15 ug/mL (ref 10–30)

## 2014-08-22 LAB — SALICYLATE LEVEL: Salicylate Lvl: <2 mg/dL — ABNORMAL LOW (ref 2.8–20.0)

## 2014-08-22 LAB — TROPONIN I: Troponin I: <0.3 ng/mL (ref <0.30)

## 2014-08-22 LAB — I-STAT CG4 LACTIC ACID, ED: LACTIC ACID, VENOUS: 1.33 mmol/L (ref 0.5–2.2)

## 2014-08-22 LAB — ETHANOL

## 2014-08-22 MED ORDER — ACETAMINOPHEN 650 MG RE SUPP: 650.0000 mg | Freq: Four times a day (QID) | RECTAL | Status: DC | PRN

## 2014-08-22 MED ORDER — ACETAMINOPHEN 325 MG PO TABS
650.0000 mg | ORAL_TABLET | Freq: Four times a day (QID) | ORAL | Status: DC | PRN
  Administered 2014-08-23: 650 mg via ORAL
  Filled 2014-08-22: qty 2

## 2014-08-22 MED ORDER — NICOTINE 14 MG/24HR TD PT24
14.0000 mg | MEDICATED_PATCH | Freq: Every day | TRANSDERMAL | Status: DC
  Administered 2014-08-22 – 2014-08-24 (×3): 14 mg via TRANSDERMAL
  Filled 2014-08-22 (×4): qty 1

## 2014-08-22 MED ORDER — ONDANSETRON HCL 4 MG PO TABS
4.0000 mg | ORAL_TABLET | Freq: Four times a day (QID) | ORAL | Status: DC | PRN
  Administered 2014-08-23: 4 mg via ORAL
  Filled 2014-08-22: qty 1

## 2014-08-22 MED ORDER — ALBUTEROL SULFATE (2.5 MG/3ML) 0.083% IN NEBU
2.5000 mg | INHALATION_SOLUTION | RESPIRATORY_TRACT | Status: DC | PRN
  Administered 2014-08-23: 2.5 mg via RESPIRATORY_TRACT
  Filled 2014-08-22: qty 3

## 2014-08-22 MED ORDER — ONDANSETRON HCL 4 MG/2ML IJ SOLN
4.0000 mg | Freq: Four times a day (QID) | INTRAMUSCULAR | Status: DC | PRN
  Administered 2014-08-24: 4 mg via INTRAVENOUS
  Filled 2014-08-22 (×2): qty 2

## 2014-08-22 MED ORDER — MOMETASONE FURO-FORMOTEROL FUM 100-5 MCG/ACT IN AERO
2.0000 | INHALATION_SPRAY | Freq: Two times a day (BID) | RESPIRATORY_TRACT | Status: DC
  Administered 2014-08-22 – 2014-08-24 (×3): 2 via RESPIRATORY_TRACT
  Filled 2014-08-22 (×2): qty 8.8

## 2014-08-22 MED ORDER — CLONAZEPAM 0.5 MG PO TABS
0.5000 mg | ORAL_TABLET | Freq: Two times a day (BID) | ORAL | Status: DC | PRN
  Administered 2014-08-23 – 2014-08-24 (×3): 0.5 mg via ORAL
  Filled 2014-08-22 (×3): qty 1

## 2014-08-22 MED ORDER — HYDROCORTISONE 2.5 % RE CREA
TOPICAL_CREAM | Freq: Three times a day (TID) | RECTAL | Status: DC
  Administered 2014-08-23: 21:00:00 via RECTAL
  Administered 2014-08-23: 1 via RECTAL
  Administered 2014-08-23 – 2014-08-24 (×3): via RECTAL
  Filled 2014-08-22 (×2): qty 28.35

## 2014-08-22 MED ORDER — CHOLESTYRAMINE 4 G PO PACK
4.0000 g | PACK | Freq: Two times a day (BID) | ORAL | Status: DC
  Administered 2014-08-23 – 2014-08-24 (×4): 4 g via ORAL
  Filled 2014-08-22 (×6): qty 1

## 2014-08-22 MED ORDER — SODIUM CHLORIDE 0.9 % IV BOLUS (SEPSIS)
500.0000 mL | Freq: Once | INTRAVENOUS | Status: AC
  Administered 2014-08-22: 500 mL via INTRAVENOUS

## 2014-08-22 MED ORDER — SODIUM CHLORIDE 0.9 % IV SOLN
INTRAVENOUS | Status: AC
  Administered 2014-08-22: 17:00:00 via INTRAVENOUS

## 2014-08-22 MED ORDER — CLOPIDOGREL BISULFATE 75 MG PO TABS
75.0000 mg | ORAL_TABLET | Freq: Every day | ORAL | Status: DC
  Administered 2014-08-23 – 2014-08-24 (×2): 75 mg via ORAL
  Filled 2014-08-22 (×2): qty 1

## 2014-08-22 MED ORDER — LIP MEDEX EX OINT
TOPICAL_OINTMENT | CUTANEOUS | Status: AC
  Administered 2014-08-22: 18:00:00
  Filled 2014-08-22: qty 7

## 2014-08-22 MED ORDER — AMMONIA AROMATIC IN INHA
RESPIRATORY_TRACT | Status: AC
  Administered 2014-08-22: 14:00:00
  Filled 2014-08-22: qty 10

## 2014-08-22 MED ORDER — SODIUM CHLORIDE 0.9 % IV SOLN: INTRAVENOUS | Status: DC

## 2014-08-22 NOTE — ED Notes (Signed)
Pt eyes open spontaneously with inhalent use. Pt alert to self and reports severe abdominal pain.

## 2014-08-22 NOTE — ED Provider Notes (Signed)
CSN: [id number]     Arrival date & time 08/22/14  1348 History   First MD Initiated Contact with Patient 08/22/14 1405     Chief Complaint  Patient presents with  . Altered Mental Status     (Consider location/radiation/quality/duration/timing/severity/associated sxs/prior Treatment) HPI Comments: 80 year old female with history of diabetes, anemia, history of smoking, depression, chronic insomnia, internal and external hemorrhoids, colon cancer, TIA presents with diarrhea andaltered mental status. Patient was seen in the ER at [facility] history evening for general abdominal pain and blood in the stools. Patient had a CAT scan and blood work without any acute findings. Patient has had recurrent diarrhea for the past 4 days, history of C. Difficile but no recent antibiotics.the daughter provides details of history as patient is hard of hearing in general lethargy on arrival. The patient called the daughter saying she did not feel well and she felt like she was going to die.  Patient has been frustrated with recurrent diarrhea the past 4 days, no blood visualized in the stool by daughter today.  Patient is a 80 y.o. female presenting with altered mental status. The history is provided by the patient.  Altered Mental Status   Past Medical History  Diagnosis Date  . Aortic stenosis     moderate by echo 6/11 gradient 20/53mmHg for mean and peak  . TIA (transient ischemic attack)     Dr. [name]  . Peripheral vascular disease   . IBS (irritable bowel syndrome)   . Diabetes mellitus     type II  . Chronic bronchitis   . COPD (chronic obstructive pulmonary disease)   . Atherosclerosis   . Depression   . Anxiety     Dr Casimiro Needle  . Renal cyst   . Anemia     iron deficiency  . Cellulitis   . Diverticular disease   . Infectious diarrhea(009.2)   . Sinusitis   . Thrush   . Abnormal liver function test   . GERD (gastroesophageal reflux disease)   . Insomnia     chronic  . Adenocarcinoma  2008    colon,s/p right hemicolectomy  . Angina   . Seizures     hx of one seizure "  . Cirrhosis of liver     hx of  . Clostridium difficile diarrhea   . Thrombocytopenia 04/01/2013  . Bursitis     spine per pt  . Macular degeneration 02/18/2014    Follows with [facility]  . Internal hemorrhoids   . Asthma    Past Surgical History  Procedure Laterality Date  . Carotid endarterectomy    . Colectomy      right hemi  . Cholecystectomy  11/09  . Upper gastrointestinal endoscopy    . Tonsillectomy    . Appendectomy     Family History  Problem Relation Age of Onset  . Colon cancer Paternal Grandmother   . Esophageal cancer Brother   . Heart disease Mother   . Diabetes Brother   . Hypertension Father   . Rectal cancer Neg Hx   . Stomach cancer Neg Hx    History  Substance Use Topics  . Smoking status: Former Smoker -- 0.50 packs/day for 70 years    Types: Cigarettes    Start date: 02/14/1949    Quit date: 01/03/2014  . Smokeless tobacco: Never Used     Comment: Still using  Electronic cigarette  . Alcohol Use: No   OB History    No data available  Review of Systems  Unable to perform ROS: Mental status change      Allergies  Bactrim; Levofloxacin; Pneumovax; Amoxicillin-pot clavulanate; Penicillins; Bupropion; Haldol; Haloperidol; Morphine; Morphine and related; Pneumococcal vaccine; Pseudoephedrine hcl; and Sudafed  Home Medications   Prior to Admission medications   Medication Sig Start Date End Date Taking? Authorizing Provider  albuterol (PROAIR HFA) 108 (90 BASE) MCG/ACT inhaler Inhale 2 puffs into the lungs every 4 (four) hours as needed for wheezing or shortness of breath.   Yes Historical Provider, MD  albuterol (PROVENTIL) (2.5 MG/3ML) 0.083% nebulizer solution Take 2.5 mg by nebulization every 4 (four) hours as needed for wheezing or shortness of breath. 03/22/14  Yes Mosie Lukes, MD  cholestyramine Lucrezia Starch) 4 G packet Take 4 g by mouth daily  as needed (diarrhea).    Yes Historical Provider, MD  clonazePAM (KLONOPIN) 1 MG tablet Take 1 mg by mouth 2 (two) times daily as needed for anxiety.    Yes Historical Provider, MD  clopidogrel (PLAVIX) 75 MG tablet TAKE ONE (1) TABLET EACH DAY 07/30/14  Yes Mosie Lukes, MD  cyanocobalamin 500 MCG tablet Take 500 mcg by mouth daily.   Yes Historical Provider, MD  ferrous sulfate 325 (65 FE) MG tablet Take 650 mg by mouth daily after breakfast. 11/13/13  Yes Debbrah Alar, NP  fluticasone (FLONASE) 50 MCG/ACT nasal spray Place 2 sprays into both nostrils daily. Patient taking differently: Place 2 sprays into both nostrils daily as needed for allergies.  08/03/14  Yes Elsie Stain, MD  Fluticasone-Salmeterol (ADVAIR) 250-50 MCG/DOSE AEPB Inhale 1 puff into the lungs 2 (two) times daily. 04/05/14  Yes Elsie Stain, MD  Multiple Vitamin (MULTIVITAMIN WITH MINERALS) TABS tablet Take 1 tablet by mouth daily.   Yes Historical Provider, MD  Multiple Vitamins-Minerals (OCUVITE PRESERVISION) TABS Take by mouth 2 (two) times daily.   Yes Historical Provider, MD  ondansetron (ZOFRAN) 4 MG tablet Take 1 tablet (4 mg total) by mouth every 6 (six) hours as needed for nausea or vomiting. 01/23/14  Yes Willia Craze, NP  Polyethyl Glycol-Propyl Glycol (SYSTANE) 0.4-0.3 % SOLN Place 1 drop into both eyes 2 (two) times daily as needed.   Yes Historical Provider, MD  potassium chloride (K-DUR) 10 MEQ tablet Take 10 mEq by mouth 2 (two) times daily.   Yes Historical Provider, MD  saccharomyces boulardii (FLORASTOR) 250 MG capsule Take 250 mg by mouth daily.   Yes Historical Provider, MD  sodium chloride (OCEAN) 0.65 % SOLN nasal spray Place 2 sprays into both nostrils daily.   Yes Historical Provider, MD  traMADol (ULTRAM) 50 MG tablet TAKE ONE (1) TABLET EVERY 6 HOURS AS NEEDED FOR PAIN (MODERATE PAIN) 07/12/14  Yes Mosie Lukes, MD   BP 107/32 mmHg  Pulse 76  Temp(Src) 96.9 F (36.1 C) (Rectal)   Resp 26  SpO2 89% Physical Exam  Constitutional: She is oriented to person, place, and time. She appears well-developed. No distress.  HENT:  Head: Normocephalic and atraumatic.  Dry mucous membranes  Eyes: Conjunctivae are normal. Right eye exhibits no discharge. Left eye exhibits no discharge.  Neck: Normal range of motion. Neck supple. No tracheal deviation present.  Cardiovascular: Normal rate and regular rhythm.   Pulmonary/Chest: Effort normal. She has wheezes (mild expiratory wheeze upper lungs bilateral).  Abdominal: Soft. She exhibits no distension. There is tenderness (mildleft lower quadrant). There is no guarding.  Musculoskeletal: She exhibits no edema.  Neurological: She is alert and  oriented to person, place, and time.  Patient moves upper and lower extremities equal bilateral with mild general weakness, finger nose intact, pupils equal, extra on the muscle function intact, neck supple no meningismus.  Skin: Skin is warm. No rash noted.  Psychiatric: She has a normal mood and affect.  Nursing note and vitals reviewed.   ED Course  Procedures (including critical care time) Labs Review Labs Reviewed  BASIC METABOLIC PANEL - Abnormal; Notable for the following:    Glucose, Bld 168 (*)    GFR calc non Af Amer 77 (*)    GFR calc Af Amer 90 (*)    All other components within normal limits  CBC WITH DIFFERENTIAL - Abnormal; Notable for the following:    Hemoglobin 11.7 (*)    Platelets 122 (*)    Eosinophils Relative 8 (*)    All other components within normal limits  URINE CULTURE  CLOSTRIDIUM DIFFICILE BY PCR  ETHANOL  ACETAMINOPHEN LEVEL  SALICYLATE LEVEL  URINE RAPID DRUG SCREEN (HOSP PERFORMED)  URINALYSIS, ROUTINE W REFLEX MICROSCOPIC  TROPONIN I  I-STAT CG4 LACTIC ACID, ED    Imaging Review Ct Head Wo Contrast  08/22/2014   CLINICAL DATA:  80 year old with altered mental status, weakness, diarrhea  EXAM: CT HEAD WITHOUT CONTRAST  TECHNIQUE: Contiguous  axial images were obtained from the base of the skull through the vertex without intravenous contrast.  COMPARISON:  06/21/2013  FINDINGS: There is diffuse, confluent, periventricular white matter hypodensity, compatible with chronic small vessel white matter disease.  Small remote infarcts are seen involving the right basal ganglia, right occipital lobe and right cerebellum.  Bilateral basal ganglia calcifications are present.  Cerebral and cerebellar atrophy are similar to prior.  There is no evidence of an acute infarct.  There is no acute intracranial hemorrhage.  There is no midline shift or mass effect.  There is no extra-axial fluid collection.  The paranasal sinuses and mastoid air cells are aerated.  The orbits are intact.  Sequela of prior cataract surgery is noted.  Vascular calcifications are noted in the region of the skull base.  IMPRESSION: 1. Extensive chronic small vessel white matter disease. 2. Remote infarcts. 3. No acute intracranial process.   Electronically Signed   By: Rosemarie Ax   On: 08/22/2014 15:11   Ct Abdomen Pelvis W Contrast  08/21/2014   CLINICAL DATA:  Abdominal pain and recurrent intermittent rectal bleeding onset 3 days ago. Fever and chills. HX: Hemorrhoids, Cirrhosis of liver. Pt states she had a colon resection x 13 years ago.  EXAM: CT ABDOMEN AND PELVIS WITH CONTRAST  TECHNIQUE: Multidetector CT imaging of the abdomen and pelvis was performed using the standard protocol following bolus administration of intravenous contrast.  CONTRAST:  38mL OMNIPAQUE IOHEXOL 300 MG/ML SOLN, 150mL OMNIPAQUE IOHEXOL 300 MG/ML SOLN  COMPARISON:  06/29/2014  FINDINGS: Subpleural blebs posteriorly in the visualized right lower lobe. Coronary calcifications. Aortoiliac arterial calcifications with ectatic 2.9 cm infrarenal aorta. Large bilateral renal cysts. Nodular liver contour without focal lesion. Surgical clips in the gallbladder fossa. Mild splenomegaly. Portal and splenic veins  patent. Patent coronary vein to paraumbilical collaterals. Unremarkable adrenal glands, pancreas. Stomach, small bowel, and colon are nondilated. Changes of partial right hemicolectomy. No ascites. No free air. Urinary bladder physiologically distended. Bilateral pelvic vascular calcifications. No adenopathy localized. Degenerative disc disease in the lower lumbar spine most marked L5-S1.  IMPRESSION: 1. No acute abdominal process. 2. Cirrhosis with stigmata of portal venous hypertension. 3.  Ectatic abdominal aorta at risk for aneurysm development. Recommend followup by [place] in 5 years. This recommendation follows ACR consensus guidelines: White Paper of the ACR Incidental Findings Committee II on Vascular Findings. J Am Coll Radiol 2013; 10:789-794.   Electronically Signed   By: Arne Cleveland M.D.   On: 08/21/2014 21:35     EKG Interpretation None     EKG reviewed heart rate 75, sinus, mild prolonged corrected QT, T-wave inversions laterally and flattening MDM   Final diagnoses:  Altered mental state  Diarrhea  Dehydration   Patient who lives alone presents with general weakness, lethargy and recurrent diarrhea the past 4 days. Patient was recently evaluated and had CT scan results reviewed without any acute findings. On exam patient initially requiring sternal rub and with time improved to build to sit up with assistance. The daughter feels patient still generally weak and not back to her baseline. Clinically dehydrated, plan for IV fluid bolus. Blood work pending and plan for observation admission the hospital with recurrent diarrhea, second visit to the ER in 2 days, elderly, lives alone.  Discussed with Dr. [place] Pink who accepts for observation, urinalysis pending. The patients results and plan were reviewed and discussed.   Any x-rays performed were personally reviewed by myself.   Differential diagnosis were considered with the presenting HPI.  Medications  0.9 %  sodium chloride  infusion (not administered)  ammonia inhalant (  Given 08/22/14 1405)  sodium chloride 0.9 % bolus 500 mL (500 mLs Intravenous New Bag/Given 08/22/14 1600)    Filed Vitals:   08/22/14 1445 08/22/14 1515 08/22/14 1542 08/22/14 1613  BP:   94/44 107/32  Pulse: 76  78 76  Temp:      TempSrc:      Resp: 15 24 18 26   SpO2: 97%  92% 89%    Final diagnoses:  Altered mental state  Diarrhea  Dehydration    Admission/ observation were discussed with the admitting physician, patient and/or family and they are comfortable with the plan.     Mariea Clonts, MD 08/22/14 [phone]

## 2014-08-22 NOTE — ED Notes (Addendum)
Per EMS pt called daughter stating "I am tried of fighting. I feel like I am dying." Pt seen last [facility] for hemorrhoids. Daughter continues stating "she has not slept all night." On scene bottle of clonazepam 1 mg tab count of 60 prescribed 07/25/2014 with 10 tablets left. Daughter reports "medications taken as prescribed and seem to be at correct count."  Pt awakens to painful stimuli. EKG unremarkable.

## 2014-08-22 NOTE — H&P (Signed)
[facility] History and Physical  [name]:[id number] DOB: 04/08/1929 DOA: 08/22/2014   PCP: Penni Homans, MD  Specialists: Followed by Dr. Henrene Pastor with gastroenterology. Followed by Dr. Asencion Noble with pulmonology for COPD.  Chief Complaint: Worsening diarrhea  HPI: Monique Fleming is a 80 y.o. female with a past medical history of COPD, colorectal cancer in the past, status post surgery, following which she developed chronic diarrhea. She would have 2 bowel movements a day which would be loose. She also has struggled with pain in the rectal area with hemorrhoids. Occasionally the hemorrhoids would bleeding. Most of the history was provided by the daughter as the patient is extremely hard of hearing and has mild cognitive impairment. Apparently for the last 4 days the frequency of diarrhea has worsened. She is having 6-8 bowel movements every day. She has noticed some blood in stool as well. She went to the emergency department yesterday. Rectal bleeding was ruled out. She was discharged home after CT scan did not show any concerning findings. However, patient's symptoms did not subside. Apparently she had diarrhea all through the night last night. She called one of her daughters this morning saying that she felt very weak and felt as if she was dying. The daughter came to the house. She found the patient to be unresponsive. She called EMS. After using smelling salts patient woke up. And now appears to be back to baseline. There was a concern that she may have taken too many of her Klonopin. However, the pill count was accurate. Abdominal pain is present chronically, but no acute worsening currently. Denies any chest pain or shortness of breath. Overall history is limited in this patient.  Home Medications: Prior to Admission medications   Medication Sig Start Date End Date Taking? Authorizing Provider  albuterol (PROAIR HFA) 108 (90 BASE) MCG/ACT inhaler Inhale 2 puffs into the  lungs every 4 (four) hours as needed for wheezing or shortness of breath.   Yes Historical Provider, MD  albuterol (PROVENTIL) (2.5 MG/3ML) 0.083% nebulizer solution Take 2.5 mg by nebulization every 4 (four) hours as needed for wheezing or shortness of breath. 03/22/14  Yes Mosie Lukes, MD  cholestyramine Lucrezia Starch) 4 G packet Take 4 g by mouth daily as needed (diarrhea).    Yes Historical Provider, MD  clonazePAM (KLONOPIN) 1 MG tablet Take 1 mg by mouth 2 (two) times daily as needed for anxiety.    Yes Historical Provider, MD  clopidogrel (PLAVIX) 75 MG tablet TAKE ONE (1) TABLET EACH DAY 07/30/14  Yes Mosie Lukes, MD  cyanocobalamin 500 MCG tablet Take 500 mcg by mouth daily.   Yes Historical Provider, MD  ferrous sulfate 325 (65 FE) MG tablet Take 650 mg by mouth daily after breakfast. 11/13/13  Yes Debbrah Alar, NP  fluticasone (FLONASE) 50 MCG/ACT nasal spray Place 2 sprays into both nostrils daily. Patient taking differently: Place 2 sprays into both nostrils daily as needed for allergies.  08/03/14  Yes Elsie Stain, MD  Fluticasone-Salmeterol (ADVAIR) 250-50 MCG/DOSE AEPB Inhale 1 puff into the lungs 2 (two) times daily. 04/05/14  Yes Elsie Stain, MD  Multiple Vitamin (MULTIVITAMIN WITH MINERALS) TABS tablet Take 1 tablet by mouth daily.   Yes Historical Provider, MD  Multiple Vitamins-Minerals (OCUVITE PRESERVISION) TABS Take by mouth 2 (two) times daily.   Yes Historical Provider, MD  ondansetron (ZOFRAN) 4 MG tablet Take 1 tablet (4 mg total) by mouth every 6 (six) hours as needed for  nausea or vomiting. 01/23/14  Yes Willia Craze, NP  Polyethyl Glycol-Propyl Glycol (SYSTANE) 0.4-0.3 % SOLN Place 1 drop into both eyes 2 (two) times daily as needed.   Yes Historical Provider, MD  potassium chloride (K-DUR) 10 MEQ tablet Take 10 mEq by mouth 2 (two) times daily.   Yes Historical Provider, MD  saccharomyces boulardii (FLORASTOR) 250 MG capsule Take 250 mg by mouth daily.    Yes Historical Provider, MD  sodium chloride (OCEAN) 0.65 % SOLN nasal spray Place 2 sprays into both nostrils daily.   Yes Historical Provider, MD  traMADol (ULTRAM) 50 MG tablet TAKE ONE (1) TABLET EVERY 6 HOURS AS NEEDED FOR PAIN (MODERATE PAIN) 07/12/14  Yes Mosie Lukes, MD    Allergies:  Allergies  Allergen Reactions  . Bactrim [Sulfamethoxazole-Trimethoprim] Shortness Of Breath  . Levofloxacin Other (See Comments)    seizure  . Pneumovax [Pneumococcal Polysaccharide Vaccine] Other (See Comments)    Severe allergy-cellulitis  . Amoxicillin-Pot Clavulanate Other (See Comments)    Patient stated Drs. recommend taking Augmentin due to her PCN allergy.  . Penicillins Rash  . Bupropion   . Haldol [Haloperidol Lactate]     Had unknown reaction many yrs ago per pt  . Haloperidol   . Morphine   . Morphine And Related     Confused and agitation  . Pneumococcal Vaccine   . Pseudoephedrine Hcl   . Sudafed [Pseudoephedrine Hcl] Other (See Comments)    seizure    Past Medical History: Past Medical History  Diagnosis Date  . Aortic stenosis     moderate by echo 6/11 gradient 20/39mHg for mean and peak  . TIA (transient ischemic attack)     Dr. SLeonie Man . Peripheral vascular disease   . IBS (irritable bowel syndrome)   . Diabetes mellitus     type II  . Chronic bronchitis   . COPD (chronic obstructive pulmonary disease)   . Atherosclerosis   . Depression   . Anxiety     Dr PCasimiro Needle . Renal cyst   . Anemia     iron deficiency  . Cellulitis   . Diverticular disease   . Infectious diarrhea(009.2)   . Sinusitis   . Thrush   . Abnormal liver function test   . GERD (gastroesophageal reflux disease)   . Insomnia     chronic  . Adenocarcinoma 2008    colon,s/p right hemicolectomy  . Angina   . Seizures     hx of one seizure "  . Cirrhosis of liver     hx of  . Clostridium difficile diarrhea   . Thrombocytopenia 04/01/2013  . Bursitis     spine per pt  . Macular  degeneration 02/18/2014    [facility]   . Asthma     Past Surgical History  Procedure Laterality Date  . Carotid endarterectomy    . Colectomy      right hemi  . Cholecystectomy  11/09  . Upper gastrointestinal endoscopy    . Tonsillectomy    . Appendectomy      Social History: [place] One of her other daughters lives next door to her. She is quite independent and still drives short distances. Smokes half a pack of cigarettes on a daily basis. No alcohol use. No illicit drug use.  Family History:  Family History  Problem Relation Age of Onset  . Colon cancer Paternal Grandmother   .  Esophageal cancer Brother   . Heart disease Mother   . Diabetes Brother   . Hypertension Father   . Rectal cancer Neg Hx   . Stomach cancer Neg Hx      Review of Systems - unable to do due to her hearing impairment and cognitive impairment  Physical Examination  Filed Vitals:   08/22/14 1515 08/22/14 1542 08/22/14 1613 08/22/14 1630  BP:  94/44 107/32 124/57  Pulse:  78 76 75  Temp:      TempSrc:      Resp: _0 SpO2:  92% 89% 99%    BP 124/57 mmHg  Pulse 75  Temp(Src) 96.9 F (36.1 C) (Rectal)  Resp 28  SpO2 99%  General appearance: appears stated age, distracted, fatigued and no distress Head: Normocephalic, without obvious abnormality, atraumatic Eyes: conjunctivae/corneas clear. PERRL, EOM's intact.  Throat: slightly dry mucous membranes Neck: small nodule measuring about 1-2 cm palpated in the posterior aspect of the neck, more towards the left side. It is slightly tender to palpation. No erythema is noted.  Resp: clear to auscultation bilaterally Cardio: regular rate and rhythm, S1, S2 normal, no murmur, click, rub or gallop GI: abdomen is soft. Vague tenderness is present diffusely without any rebound, rigidity or guarding. No masses or organomegaly. Bowel sounds are present. Extremities: extremities normal,  atraumatic, no cyanosis or edema Pulses: 2+ and symmetric Skin: Skin color, texture, turgor normal. No rashes or lesions Neurologic: she is awake, alert. No facial asymmetry. Motor strength is equal bilateral upper and lower extremity. Gait was not assessed.  Laboratory Data: Results for orders placed or performed during the hospital encounter of 08/22/14 (from the past 48 hour(s))  Basic metabolic panel     Status: Abnormal   Collection Time: 08/22/14  2:29 PM  Result Value Ref Range   Sodium 140 137 - 147 mEq/L   Potassium 4.7 3.7 - 5.3 mEq/L    Comment: MODERATE HEMOLYSIS HEMOLYSIS AT THIS LEVEL MAY AFFECT RESULT    Chloride 105 96 - 112 mEq/L   CO2 24 19 - 32 mEq/L   Glucose, Bld 168 (H) 70 - 99 mg/dL   BUN 7 6 - 23 mg/dL   Creatinine, Ser 0.73 0.50 - 1.10 mg/dL   Calcium 9.0 8.4 - 10.5 mg/dL   GFR calc non Af Amer 77 (L) >90 mL/min   GFR calc Af Amer 90 (L) >90 mL/min    Comment: (NOTE) The eGFR has been calculated using the CKD EPI equation. This calculation has not been validated in all clinical situations. eGFR's persistently <90 mL/min signify possible Chronic Kidney Disease.    Anion gap 11 5 - 15  CBC with Differential     Status: Abnormal   Collection Time: 08/22/14  2:29 PM  Result Value Ref Range   WBC 5.9 4.0 - 10.5 K/uL   RBC 4.20 3.87 - 5.11 MIL/uL   Hemoglobin 11.7 (L) 12.0 - 15.0 g/dL   HCT 36.5 36.0 - 46.0 %   MCV 86.9 78.0 - 100.0 fL   MCH 27.9 26.0 - 34.0 pg   MCHC 32.1 30.0 - 36.0 g/dL   RDW 15.2 11.5 - 15.5 %   Platelets 122 (L) 150 - 400 K/uL   Neutrophils Relative % 64 43 - 77 %   Neutro Abs 3.8 1.7 - 7.7 K/uL   Lymphocytes Relative 19 12 - 46 %   Lymphs Abs 1.1 0.7 - 4.0 K/uL   Monocytes Relative 8  3 - 12 %   Monocytes Absolute 0.5 0.1 - 1.0 K/uL   Eosinophils Relative 8 (H) 0 - 5 %   Eosinophils Absolute 0.5 0.0 - 0.7 K/uL   Basophils Relative 1 0 - 1 %   Basophils Absolute 0.1 0.0 - 0.1 K/uL  Acetaminophen level     Status: None    Collection Time: 08/22/14  2:29 PM  Result Value Ref Range   Acetaminophen (Tylenol), Serum <15.0 10 - 30 ug/mL    Comment:        THERAPEUTIC CONCENTRATIONS VARY SIGNIFICANTLY. A RANGE OF 10-30 ug/mL MAY BE AN EFFECTIVE CONCENTRATION FOR MANY PATIENTS. HOWEVER, SOME ARE BEST TREATED AT CONCENTRATIONS OUTSIDE THIS RANGE. ACETAMINOPHEN CONCENTRATIONS >150 ug/mL AT 4 HOURS AFTER INGESTION AND >50 ug/mL AT 12 HOURS AFTER INGESTION ARE OFTEN ASSOCIATED WITH TOXIC REACTIONS.   Salicylate level     Status: Abnormal   Collection Time: 08/22/14  2:29 PM  Result Value Ref Range   Salicylate Lvl <3.7 (L) 2.8 - 20.0 mg/dL  Ethanol     Status: None   Collection Time: 08/22/14  2:29 PM  Result Value Ref Range   Alcohol, Ethyl (B) <11 0 - 11 mg/dL    Comment:        LOWEST DETECTABLE LIMIT FOR SERUM ALCOHOL IS 11 mg/dL FOR MEDICAL PURPOSES ONLY   I-Stat CG4 Lactic Acid, ED     Status: None   Collection Time: 08/22/14  2:59 PM  Result Value Ref Range   Lactic Acid, Venous 1.33 0.5 - 2.2 mmol/L  Troponin I     Status: None   Collection Time: 08/22/14  3:44 PM  Result Value Ref Range   Troponin I <0.30 <0.30 ng/mL    Comment:        Due to the release kinetics of cTnI, a negative result within the first hours of the onset of symptoms does not rule out myocardial infarction with certainty. If myocardial infarction is still suspected, repeat the test at appropriate intervals.     Radiology Reports: Ct Head Wo Contrast  08/22/2014   CLINICAL DATA:  80 year old with altered mental status, weakness, diarrhea  EXAM: CT HEAD WITHOUT CONTRAST  TECHNIQUE: Contiguous axial images were obtained from the base of the skull through the vertex without intravenous contrast.  COMPARISON:  06/21/2013  FINDINGS: There is diffuse, confluent, periventricular white matter hypodensity, compatible with chronic small vessel white matter disease.  Small remote infarcts are seen involving the right basal  ganglia, right occipital lobe and right cerebellum.  Bilateral basal ganglia calcifications are present.  Cerebral and cerebellar atrophy are similar to prior.  There is no evidence of an acute infarct.  There is no acute intracranial hemorrhage.  There is no midline shift or mass effect.  There is no extra-axial fluid collection.  The paranasal sinuses and mastoid air cells are aerated.  The orbits are intact.  Sequela of prior cataract surgery is noted.  Vascular calcifications are noted in the region of the skull base.  IMPRESSION: 1. Extensive chronic small vessel white matter disease. 2. Remote infarcts. 3. No acute intracranial process.   Electronically Signed   By: Rosemarie Ax   On: 08/22/2014 15:11   Ct Abdomen Pelvis W Contrast  08/21/2014   CLINICAL DATA:  Abdominal pain and recurrent intermittent rectal bleeding onset 3 days ago. Fever and chills. HX: Hemorrhoids, Cirrhosis of liver. Pt states she had a colon resection x 13 years ago.  EXAM: CT ABDOMEN  AND PELVIS WITH CONTRAST  TECHNIQUE: Multidetector CT imaging of the abdomen and pelvis was performed using the standard protocol following bolus administration of intravenous contrast.  CONTRAST:  57m OMNIPAQUE IOHEXOL 300 MG/ML SOLN, 1040mOMNIPAQUE IOHEXOL 300 MG/ML SOLN  COMPARISON:  06/29/2014  FINDINGS: Subpleural blebs posteriorly in the visualized right lower lobe. Coronary calcifications. Aortoiliac arterial calcifications with ectatic 2.9 cm infrarenal aorta. Large bilateral renal cysts. Nodular liver contour without focal lesion. Surgical clips in the gallbladder fossa. Mild splenomegaly. Portal and splenic veins patent. Patent coronary vein to paraumbilical collaterals. Unremarkable adrenal glands, pancreas. Stomach, small bowel, and colon are nondilated. Changes of partial right hemicolectomy. No ascites. No free air. Urinary bladder physiologically distended. Bilateral pelvic vascular calcifications. No adenopathy localized.  Degenerative disc disease in the lower lumbar spine most marked L5-S1.  IMPRESSION: 1. No acute abdominal process. 2. Cirrhosis with stigmata of portal venous hypertension. 3. Ectatic abdominal aorta at risk for aneurysm development. Recommend followup by [place] 5 years. This recommendation follows ACR consensus guidelines: White Paper of the ACR Incidental Findings Committee II on Vascular Findings. J Am Coll Radiol 2013; 10:789-794.   Electronically Signed   By: DaArne Cleveland.D.   On: 08/21/2014 21:35    Electrocardiogram: sinus rhythm in the 70s. Normal axis. Intervals are normal. Nonspecific T-wave changes. Some of which appear to be old.  No concerning ST changes.  Problem List  Principal Problem:   Acute diarrhea Active Problems:   COPD (chronic obstructive pulmonary disease) Gold B    Mild cognitive disorder   General weakness   Dehydration   Hemorrhoids   Assessment: this is a [age]ear old Caucasian female who presents to the hospital with worsening diarrhea for the last 3-4 days. She felt weak and dehydrated. She had abdominal pain. However, her CT scan from yesterday did not show any acute findings. She does clinically appear to be dehydrated, but has unremarkable blood work. She does have a history of C. Difficile in the past which is the differential. UTI could also be present and could account for her generalized weakness.  Plan: #1 acute diarrhea: Check for C Difficile in the stool. Supportive treatment for now with IV fluids. CT scan done recently was reassuring. This could all be acute gastroenteritis. Follow clinically.  #2 Generalized weakness: Most likely due to dehydration. Rule out UTI. PT/OT consultation. She does not have any neurological deficits. Orthostatics will be checked. Troponin was normal. Lactic acid was normal.  #3 Dehydration: Most likely due to hypovolemia from diarrhea. She'll be given gentle IV hydration. Orthostatics will be checked.   #4 normocytic  anemia: Hemoglobin same as yesterday. Similar to previous values. Continue to monitor.   #5 history of hemorrhoids: Anusol cream as tolerated.  #6 history of mild thrombocytopenia: She follows with Dr. EnMarin OlpThis apparently was detected recently. We will let this be pursued as an outpatient. Of note, CT does suggest liver cirrhosis which apparently is a known condition for her based on previous records. Quite possible this could be contributig to her low platelet counts.  #7 Small nodule posterior aspect of neck: Slightly tender. Although there is no overlying erythema. I have explained to the daughter to monitor this closely. And follow-up with her PCP for same. Do not anticipate inpatient workup for this at this time.  #8 History of COPD with tobacco abuse: Nicotine patch will be prescribed. Continue with her home medications and inhalers.   DVT Prophylaxis: SCDs Code Status: full code Family Communication:  discussed with the patient's daughter  Disposition Plan: observe to MedSurg   Further management decisions will depend on results of further testing and patient's response to treatment.   [facility]  [facility] Pager [phone]  If 7PM-7AM, please contact night-coverage [email or web address] Password [email or web address]  08/22/2014, 4:44 PM

## 2014-08-22 NOTE — ED Notes (Signed)
Pt states "I made a mess last night." Daughter reports mother/pt referring to diarrhea. Pt more alert and sitting up at present time. Neurological assessment unremarkable other than pt disoriented to time.

## 2014-08-22 NOTE — Telephone Encounter (Signed)
Admitted to hospital

## 2014-08-22 NOTE — ED Notes (Signed)
Pt denies ingestion.

## 2014-08-22 NOTE — Telephone Encounter (Signed)
Per notes, patient was seen at ED yesterday.

## 2014-08-23 ENCOUNTER — Observation Stay (HOSPITAL_COMMUNITY): Payer: Medicare Other

## 2014-08-23 DIAGNOSIS — R4182 Altered mental status, unspecified: Secondary | ICD-10-CM | POA: Diagnosis not present

## 2014-08-23 DIAGNOSIS — R0789 Other chest pain: Secondary | ICD-10-CM

## 2014-08-23 DIAGNOSIS — K580 Irritable bowel syndrome with diarrhea: Secondary | ICD-10-CM | POA: Diagnosis not present

## 2014-08-23 DIAGNOSIS — E860 Dehydration: Secondary | ICD-10-CM | POA: Diagnosis not present

## 2014-08-23 DIAGNOSIS — E119 Type 2 diabetes mellitus without complications: Secondary | ICD-10-CM | POA: Diagnosis not present

## 2014-08-23 DIAGNOSIS — R197 Diarrhea, unspecified: Secondary | ICD-10-CM

## 2014-08-23 DIAGNOSIS — K640 First degree hemorrhoids: Secondary | ICD-10-CM

## 2014-08-23 LAB — CLOSTRIDIUM DIFFICILE BY PCR: CDIFFPCR: NEGATIVE

## 2014-08-23 LAB — URINE CULTURE
Colony Count: NO GROWTH
Culture: NO GROWTH

## 2014-08-23 LAB — CBC
HCT: 31.5 % — ABNORMAL LOW (ref 36.0–46.0)
Hemoglobin: 10 g/dL — ABNORMAL LOW (ref 12.0–15.0)
MCH: 27.4 pg (ref 26.0–34.0)
MCHC: 31.7 g/dL (ref 30.0–36.0)
MCV: 86.3 fL (ref 78.0–100.0)
Platelets: 102 10*3/uL — ABNORMAL LOW (ref 150–400)
RBC: 3.65 MIL/uL — AB (ref 3.87–5.11)
RDW: 15 % (ref 11.5–15.5)
WBC: 3.8 10*3/uL — ABNORMAL LOW (ref 4.0–10.5)

## 2014-08-23 LAB — TROPONIN I: Troponin I: <0.3 ng/mL (ref <0.30)

## 2014-08-23 LAB — COMPREHENSIVE METABOLIC PANEL
ALK PHOS: 105 U/L (ref 39–117)
ALT: 10 U/L (ref 0–35)
AST: 30 U/L (ref 0–37)
Albumin: 2.7 g/dL — ABNORMAL LOW (ref 3.5–5.2)
Anion gap: 11 (ref 5–15)
BUN: 7 mg/dL (ref 6–23)
CO2: 23 meq/L (ref 19–32)
Calcium: 8.1 mg/dL — ABNORMAL LOW (ref 8.4–10.5)
Chloride: 109 mEq/L (ref 96–112)
Creatinine, Ser: 0.68 mg/dL (ref 0.50–1.10)
GFR calc Af Amer: >90 mL/min (ref >90)
GFR, EST NON AFRICAN AMERICAN: 79 mL/min — AB (ref >90)
GLUCOSE: 127 mg/dL — AB (ref 70–99)
POTASSIUM: 3.9 meq/L (ref 3.7–5.3)
SODIUM: 143 meq/L (ref 137–147)
Total Bilirubin: 0.4 mg/dL (ref 0.3–1.2)
Total Protein: 6.1 g/dL (ref 6.0–8.3)

## 2014-08-23 LAB — CK TOTAL AND CKMB (NOT AT ARMC)
CK, MB: 2.7 ng/mL (ref 0.3–4.0)
Relative Index: INVALID (ref 0.0–2.5)
Total CK: 85 U/L (ref 7–177)

## 2014-08-23 MED ORDER — HYDROMORPHONE HCL 1 MG/ML IJ SOLN
0.5000 mg | Freq: Once | INTRAMUSCULAR | Status: AC
  Administered 2014-08-24: 0.5 mg via INTRAVENOUS
  Filled 2014-08-23: qty 1

## 2014-08-23 MED ORDER — KETOROLAC TROMETHAMINE 15 MG/ML IJ SOLN
15.0000 mg | Freq: Once | INTRAMUSCULAR | Status: AC
  Administered 2014-08-24: 15 mg via INTRAVENOUS
  Filled 2014-08-23: qty 1

## 2014-08-23 MED ORDER — SODIUM CHLORIDE 0.9 % IV SOLN
INTRAVENOUS | Status: AC
  Administered 2014-08-23: 16:00:00 via INTRAVENOUS

## 2014-08-23 MED ORDER — LEVALBUTEROL HCL 1.25 MG/0.5ML IN NEBU
1.2500 mg | INHALATION_SOLUTION | Freq: Three times a day (TID) | RESPIRATORY_TRACT | Status: DC
  Administered 2014-08-23: 1.25 mg via RESPIRATORY_TRACT
  Filled 2014-08-23 (×3): qty 0.5

## 2014-08-23 MED ORDER — SACCHAROMYCES BOULARDII 250 MG PO CAPS
250.0000 mg | ORAL_CAPSULE | Freq: Two times a day (BID) | ORAL | Status: DC
  Administered 2014-08-23 – 2014-08-24 (×2): 250 mg via ORAL
  Filled 2014-08-23 (×4): qty 1

## 2014-08-23 MED ORDER — LEVALBUTEROL HCL 1.25 MG/0.5ML IN NEBU
1.2500 mg | INHALATION_SOLUTION | Freq: Three times a day (TID) | RESPIRATORY_TRACT | Status: DC
  Administered 2014-08-24: 1.25 mg via RESPIRATORY_TRACT
  Filled 2014-08-23 (×4): qty 0.5

## 2014-08-23 NOTE — Consult Note (Signed)
Referring Provider: [facility] Primary Care Physician:  Penni Homans, MD Primary Gastroenterologist:  Dr.Perry  Reason for Consultation:  diarrhea    HPI: Monique Fleming is a 80 y.o. female well-known to Dr. Henrene Pastor with multiple significant medical problems including diabetes mellitus, cerebrovascular disease with prior stroke, aortic stenosis, COPD, hepatic cirrhosis, colon cancer status post right hemicolectomy, Clostridium difficile diarrhea, chronic noninfectious diarrhea, anxiety, depression, prior cholecystectomy, prior carotid endarterectomy, chronic anemia. She is on chronic Plavix as well as multiple other medications. The patient had been seen in the GI office on several occasions since February for evaluation of minor rectal bleeding. She was seen on 07/03/2014 for evaluation of bleeding and CT scan suggesting mild rectal wall thickening. On exam, just inside the anus there was a large inflamed hemorrhoid versus rectal varix (see former was favored). The patient was treated with steroid suppositories and seen in follow-up on October 29. At that time her rectal bleeding had resolved she was having some chronic diarrhea but stated she felt okay and her only complaint was that of weakness.  The patient presented to the emergency room yesterday. She states that for the past 4 or 5 days her diarrhea had gotten worse. Through that time she says she was having 4-6 mushy to watery bowel movements daily. She did not have any other bright red blood with her bowel movements and she had no black tarry stools. She did not have any nocturnal stooling. She had no mucus with her stools and no sensation of incomplete evacuation. Per pt, she thinks she had an antibiotic a few weeks ago for a URI the patient says her appetite has been poor and she feels weak.Pt had been on cholestyramine and florastor prior to admission.  Pt last had a colonoscopy on 10/13/2012 by Dr Henrene Pastor that showed angiodysplastic  lesions ion the right colon. The colonic  mucosa was otherwise normal s/p right hemicolectomy.   Past Medical History  Diagnosis Date  . Aortic stenosis     moderate by echo 6/11 gradient 20/49mmHg for mean and peak  . TIA (transient ischemic attack)     Dr. [name]  . Peripheral vascular disease   . IBS (irritable bowel syndrome)   . Diabetes mellitus     type II  . Chronic bronchitis   . COPD (chronic obstructive pulmonary disease)   . Atherosclerosis   . Depression   . Anxiety     Dr Casimiro Needle  . Renal cyst   . Anemia     iron deficiency  . Cellulitis   . Diverticular disease   . Infectious diarrhea(009.2)   . Sinusitis   . Thrush   . Abnormal liver function test   . GERD (gastroesophageal reflux disease)   . Insomnia     chronic  . Adenocarcinoma 2008    colon,s/p right hemicolectomy  . Angina   . Seizures     hx of one seizure "  . Cirrhosis of liver     hx of  . Clostridium difficile diarrhea   . Thrombocytopenia 04/01/2013  . Bursitis     spine per pt  . Macular degeneration 02/18/2014    Follows with [facility]  . Internal hemorrhoids   . Asthma     Past Surgical History  Procedure Laterality Date  . Carotid endarterectomy    . Colectomy      right hemi  . Cholecystectomy  11/09  . Upper gastrointestinal endoscopy    . Tonsillectomy    .  Appendectomy      Prior to Admission medications   Medication Sig Start Date End Date Taking? Authorizing Provider  albuterol (PROAIR HFA) 108 (90 BASE) MCG/ACT inhaler Inhale 2 puffs into the lungs every 4 (four) hours as needed for wheezing or shortness of breath.   Yes Historical Provider, MD  albuterol (PROVENTIL) (2.5 MG/3ML) 0.083% nebulizer solution Take 2.5 mg by nebulization every 4 (four) hours as needed for wheezing or shortness of breath. 03/22/14  Yes Mosie Lukes, MD  cholestyramine Lucrezia Starch) 4 G packet Take 4 g by mouth daily as needed (diarrhea).    Yes Historical Provider, MD  clonazePAM (KLONOPIN) 1  MG tablet Take 1 mg by mouth 2 (two) times daily as needed for anxiety.    Yes Historical Provider, MD  clopidogrel (PLAVIX) 75 MG tablet TAKE ONE (1) TABLET EACH DAY 07/30/14  Yes Mosie Lukes, MD  cyanocobalamin 500 MCG tablet Take 500 mcg by mouth daily.   Yes Historical Provider, MD  ferrous sulfate 325 (65 FE) MG tablet Take 650 mg by mouth daily after breakfast. 11/13/13  Yes Debbrah Alar, NP  fluticasone (FLONASE) 50 MCG/ACT nasal spray Place 2 sprays into both nostrils daily. Patient taking differently: Place 2 sprays into both nostrils daily as needed for allergies.  08/03/14  Yes Elsie Stain, MD  Fluticasone-Salmeterol (ADVAIR) 250-50 MCG/DOSE AEPB Inhale 1 puff into the lungs 2 (two) times daily. 04/05/14  Yes Elsie Stain, MD  Multiple Vitamin (MULTIVITAMIN WITH MINERALS) TABS tablet Take 1 tablet by mouth daily.   Yes Historical Provider, MD  Multiple Vitamins-Minerals (OCUVITE PRESERVISION) TABS Take by mouth 2 (two) times daily.   Yes Historical Provider, MD  ondansetron (ZOFRAN) 4 MG tablet Take 1 tablet (4 mg total) by mouth every 6 (six) hours as needed for nausea or vomiting. 01/23/14  Yes Willia Craze, NP  Polyethyl Glycol-Propyl Glycol (SYSTANE) 0.4-0.3 % SOLN Place 1 drop into both eyes 2 (two) times daily as needed.   Yes Historical Provider, MD  potassium chloride (K-DUR) 10 MEQ tablet Take 10 mEq by mouth 2 (two) times daily.   Yes Historical Provider, MD  saccharomyces boulardii (FLORASTOR) 250 MG capsule Take 250 mg by mouth daily.   Yes Historical Provider, MD  sodium chloride (OCEAN) 0.65 % SOLN nasal spray Place 2 sprays into both nostrils daily.   Yes Historical Provider, MD  traMADol (ULTRAM) 50 MG tablet TAKE ONE (1) TABLET EVERY 6 HOURS AS NEEDED FOR PAIN (MODERATE PAIN) 07/12/14  Yes Mosie Lukes, MD    Current Facility-Administered Medications  Medication Dose Route Frequency Provider Last Rate Last Dose  . 0.9 %  sodium chloride infusion    Intravenous STAT Theodis Blaze, MD      . acetaminophen (TYLENOL) tablet 650 mg  650 mg Oral Q6H PRN Bonnielee Haff, MD   650 mg at 08/23/14 1300   Or  . acetaminophen (TYLENOL) suppository 650 mg  650 mg Rectal Q6H PRN Bonnielee Haff, MD      . albuterol (PROVENTIL) (2.5 MG/3ML) 0.083% nebulizer solution 2.5 mg  2.5 mg Nebulization Q4H PRN Bonnielee Haff, MD   2.5 mg at 08/23/14 1431  . cholestyramine (QUESTRAN) packet 4 g  4 g Oral Q12H Bonnielee Haff, MD   4 g at 08/23/14 1428  . clonazePAM (KLONOPIN) tablet 0.5 mg  0.5 mg Oral BID PRN Bonnielee Haff, MD   0.5 mg at 08/23/14 1259  . clopidogrel (PLAVIX) tablet 75 mg  75 mg Oral Daily Bonnielee Haff, MD   75 mg at 08/23/14 1122  . hydrocortisone (ANUSOL-HC) 2.5 % rectal cream   Rectal TID Bonnielee Haff, MD      . mometasone-formoterol [facility]) 100-5 MCG/ACT inhaler 2 puff  2 puff Inhalation BID Bonnielee Haff, MD   2 puff at 08/22/14 2247  . nicotine (NICODERM CQ - dosed in mg/24 hours) patch 14 mg  14 mg Transdermal Daily Bonnielee Haff, MD   14 mg at 08/23/14 1124  . ondansetron (ZOFRAN) tablet 4 mg  4 mg Oral Q6H PRN Bonnielee Haff, MD   4 mg at 08/23/14 1300   Or  . ondansetron (ZOFRAN) injection 4 mg  4 mg Intravenous Q6H PRN Bonnielee Haff, MD        Allergies as of 08/22/2014 - Review Complete 08/22/2014  Allergen Reaction Noted  . Bactrim [sulfamethoxazole-trimethoprim] Shortness Of Breath 03/31/2012  . Levofloxacin Other (See Comments) 04/16/2011  . Pneumovax [pneumococcal polysaccharide vaccine] Other (See Comments) 03/13/2011  . Amoxicillin-pot clavulanate Other (See Comments) 04/20/2011  . Penicillins Rash   . Bupropion  04/05/2014  . Haldol [haloperidol lactate]  02/09/2012  . Haloperidol  04/05/2014  . Morphine  04/05/2014  . Morphine and related  07/22/2011  . Pneumococcal vaccine  04/05/2014  . Pseudoephedrine hcl  04/05/2014  . Sudafed [pseudoephedrine hcl] Other (See Comments) 04/16/2011    Family History    Problem Relation Age of Onset  . Colon cancer Paternal Grandmother   . Esophageal cancer Brother   . Heart disease Mother   . Diabetes Brother   . Hypertension Father   . Rectal cancer Neg Hx   . Stomach cancer Neg Hx     History   Social History  . Marital Status: Widowed    Spouse Name: N/A    Number of Children: 3  . Years of Education: N/A   Occupational History  . retired   .     Social History Main Topics  . Smoking status: Former Smoker -- 0.50 packs/day for 70 years    Types: Cigarettes    Start date: 02/14/1951    Quit date: 01/03/2014  . Smokeless tobacco: Never Used     Comment: Still using  Electronic cigarette  . Alcohol Use: No  . Drug Use: No  . Sexual Activity: No   Other Topics Concern  . Not on file   Social History Narrative   Patient signed a Designated Party Release to allow her daughter, Monique Fleming, to have access to her medical records/information. Entered by Fleet Contras March 24,2011 @ 2:47 pm   Dailly Caffeine Use    Review of Systems: Gen: Denies any fever, chills CV: Denies chest pain Resp: Denies dyspnea at rest GI: Denies vomiting blood, jaundice, and fecal incontinence.   Denies dysphagia or odynophagia. GU : Denies urinary burning, blood in urine MS: Denies joint pain  Derm: Denies rash, itching, dry skin, hives, moles, warts, or unhealing ulcers.  Heme: Denies bruising, bleeding, and enlarged lymph nodes. Neuro:  Denies any headaches, dizziness, paresthesias. Endo:  Denies any problems with DM, thyroid, adrenal function.  Physical Exam: Vital signs in last 24 hours: Temp:  [97.4 F (36.3 C)-97.8 F (36.6 C)] 97.4 F (36.3 C) (11/19 1331) Pulse Rate:  [65-78] 72 (11/19 1331) Resp:  [18-30] 30 (11/19 1331) BP: (94-125)/(32-57) 99/53 mmHg (11/19 1331) SpO2:  [89 %-99 %] 97 % (11/19 1431) Last BM Date: 08/23/14 General:   Alert,  Well-developed,  Cooperative elderly  female in NAD Head:  Normocephalic and  atraumatic. Eyes:  Sclera clear, no icterus.   Conjunctiva pink. Ears:  Normal auditory acuity. Nose:  No deformity, discharge,  or lesions. Mouth:  No deformity or lesions.   Neck:  Supple; no thyromegaly. Lungs:  Clear throughout to auscultation.   No wheezes, crackles, or rhonchi  Heart:  Regular rate and rhythm; no murmurs Abdomen:  Soft,mild lower abdominal tenderness, no rebound or guarding,, BS active,nonpalp mass or hsm.   Rectal:  Deferred  Msk:  Symmetrical without gross deformities. . Pulses:  Normal pulses noted. Extremities:  Without clubbing or edema. Neurologic:  Alert and  oriented Skin:  Intact without significant lesions or rashes.. Psych:  Alert and cooperative. Normal mood and affect.  Intake/Output from previous day: 11/18 0701 - 11/19 0700 In: 500 [I.V.:500] Out: 400 [Urine:400] Intake/Output this shift:    Lab Results:  Recent Labs  08/21/14 1830 08/22/14 1429 08/23/14 0450  WBC 5.8 5.9 3.8*  HGB 11.4* 11.7* 10.0*  HCT 35.4* 36.5 31.5*  PLT 113* 122* 102*   BMET  Recent Labs  08/21/14 1830 08/22/14 1429 08/23/14 0450  NA 147 140 143  K 3.5* 4.7 3.9  CL 107 105 109  CO2 26 24 23   GLUCOSE 236* 168* 127*  BUN 9 7 7   CREATININE 0.70 0.73 0.68  CALCIUM 8.7 9.0 8.1*   FOBT on 08/21/14  negative  LFT  Recent Labs  08/23/14 0450  PROT 6.1  ALBUMIN 2.7*  AST 30  ALT 10  ALKPHOS 105  BILITOT 0.4  PROCEDURES: Colonoscopy 10/13/2012: Endoscopic impression: 1. Angiodysplastic lesions in right colon 2.The colon mucosa was otherwise normal s/p right hemicolectomy Recommendations: 1. Stay on iron indefinitely.    Studies/Results: Ct Head Wo Contrast  08/22/2014   CLINICAL DATA:  80 year old with altered mental status, weakness, diarrhea  EXAM: CT HEAD WITHOUT CONTRAST  TECHNIQUE: Contiguous axial images were obtained from the base of the skull through the vertex without intravenous contrast.  COMPARISON:  06/21/2013  FINDINGS: There is  diffuse, confluent, periventricular white matter hypodensity, compatible with chronic small vessel white matter disease.  Small remote infarcts are seen involving the right basal ganglia, right occipital lobe and right cerebellum.  Bilateral basal ganglia calcifications are present.  Cerebral and cerebellar atrophy are similar to prior.  There is no evidence of an acute infarct.  There is no acute intracranial hemorrhage.  There is no midline shift or mass effect.  There is no extra-axial fluid collection.  The paranasal sinuses and mastoid air cells are aerated.  The orbits are intact.  Sequela of prior cataract surgery is noted.  Vascular calcifications are noted in the region of the skull base.  IMPRESSION: 1. Extensive chronic small vessel white matter disease. 2. Remote infarcts. 3. No acute intracranial process.   Electronically Signed   By: Rosemarie Ax   On: 08/22/2014 15:11   Ct Abdomen Pelvis W Contrast  08/21/2014   CLINICAL DATA:  Abdominal pain and recurrent intermittent rectal bleeding onset 3 days ago. Fever and chills. HX: Hemorrhoids, Cirrhosis of liver. Pt states she had a colon resection x 13 years ago.  EXAM: CT ABDOMEN AND PELVIS WITH CONTRAST  TECHNIQUE: Multidetector CT imaging of the abdomen and pelvis was performed using the standard protocol following bolus administration of intravenous contrast.  CONTRAST:  102mL OMNIPAQUE IOHEXOL 300 MG/ML SOLN, 171mL OMNIPAQUE IOHEXOL 300 MG/ML SOLN  COMPARISON:  06/29/2014  FINDINGS: Subpleural blebs posteriorly in the visualized  right lower lobe. Coronary calcifications. Aortoiliac arterial calcifications with ectatic 2.9 cm infrarenal aorta. Large bilateral renal cysts. Nodular liver contour without focal lesion. Surgical clips in the gallbladder fossa. Mild splenomegaly. Portal and splenic veins patent. Patent coronary vein to paraumbilical collaterals. Unremarkable adrenal glands, pancreas. Stomach, small bowel, and colon are nondilated.  Changes of partial right hemicolectomy. No ascites. No free air. Urinary bladder physiologically distended. Bilateral pelvic vascular calcifications. No adenopathy localized. Degenerative disc disease in the lower lumbar spine most marked L5-S1.  IMPRESSION: 1. No acute abdominal process. 2. Cirrhosis with stigmata of portal venous hypertension. 3. Ectatic abdominal aorta at risk for aneurysm development. Recommend followup by [place] in 5 years. This recommendation follows ACR consensus guidelines: White Paper of the ACR Incidental Findings Committee II on Vascular Findings. J Am Coll Radiol 2013; 10:789-794.   Electronically Signed   By: Arne Cleveland M.D.   On: 08/21/2014 21:35    IMPRESSION/PLAN: 80 year old female with chronic diarrhea admitted after 4 days of worsening diarrhea. She has recently completed a course of antibiotics for an upper respiratory infection, and this exacerbation of her diarrhea may represent recurrent C. difficile. A stool for C. difficile will be obtained. We will empirically start her on  florastor 250 mg twice daily .( though this was listed on patient's pre-admission meds) She should continue supportive treatment with IV fluids.Will check TTG and IgA as well.  Generalized weakness. She does not have any obvious neurologic deficits in her weakness may have been due to dehydration. She states she feels less weak since hospitalized yesterday.  Normocytic anemia. Her hemoglobin remains stable. Continue to monitor.  Dehydration. This is likely from diarrhea causing hypovolemia. Continue gentle IV hydration.  History of hemorrhoids. The patient may use Anusol cream or Anusol HC suppositories as needed.  Will follow.   Hvozdovic, Deloris Ping 08/23/2014,  Pager [phone] Attending MD note:   I have taken a history, , and reviewed the chart. I agree with the Advanced Practitioner's impression and recommendations. Minimal drop in H/H, suspect ano-rectal source of bleeding due  to diarrhea. Stool studies pending. Symptomatic treatment.  [facility] Pager # [phone]

## 2014-08-23 NOTE — Progress Notes (Signed)
PT Cancellation Note  Patient Details Name: SHANNEL ZAHM MRN: [id number] DOB: 11-Nov-1928   Cancelled Treatment:    Reason Eval/Treat Not Completed: Patient not medically ready; patient currently with chest pain and nausea, undergoing EKG.  Will attempt to see tomorrow.   WYNN,CYNDI 08/23/2014, 1:32 PM

## 2014-08-23 NOTE — Progress Notes (Addendum)
Pt c/o sudden chest pain 7/10. Pt also nauseous and had 1 episode of vomiting.  Respirations 30. Did VSs & EKG. Paged MD. New orders written & cardiology consulted.

## 2014-08-23 NOTE — Progress Notes (Signed)
UR completed 

## 2014-08-23 NOTE — Progress Notes (Signed)
Patient ID: Monique Fleming, female   DOB: 08-Oct-1928, 80 y.o.   MRN: [id number]  [facility] PROGRESS NOTE  MARGEAUX SWANTEK PTW:[id number] DOB: September 01, 1929 DOA: 09/03/2014 PCP: Penni Homans, MD  Brief narrative: 80 y.o. female with COPD, colorectal cancer in the past, status post surgery, following which she developed chronic diarrhea. She typically has 2 loose bowel movements a day. She also has struggled with pain in the rectal area with hemorrhoids. Please note that daughter at bedside provided initial history as pt was too tired, HOH and has underlying mild cognitive impairment. Per daughter, pt has had several days duration of progressively worsening watery diarrhea, > 6 episodes per day with intermittent small amounts of blood in the stool. She was evaluated for this in ED several days ago and CT abd did not reveal any specific etiology and she was discharged home. Per daughter, this has been associated with progressive weakness, poor oral intake, malaise,, mostly sleeping at home. Daughter also explains pt has been recently treated for URI with Zithromax.   Assessment and Plan:    Principal Problem:   Acute diarrhea - unclear etiology, C. Diff negative  - requested stool GI panel, stool culture, O&P - continue supportive care with IVF, analgesia and antiemetics as needed  - GI consult requested  Active Problems:   Chest pain - substernal, sudden onset, 7/10 in severity and radiating to the jaw, associated with one episode of vomiting - slightly hypotensive at this time, will provide IVF - 12 lead EKG requested, cycle CE's, request first set now - keep on telemetry, cardiology consult requested    Acute on chronic hypoxic respiratory failure  - oxygen sat's in high 80's on RA with RR 28 - 30 bpm - unclear etiology - possibly related to underlying COPD,no wheezing on exam - will ask for CXR    COPD (chronic obstructive pulmonary disease) Gold B  - continue BD's scheduled and  as needed   General weakness - secondary to acute problem, diarrhea, resulting dehydration - will need PT/OT once able to participate    Anemia, ? IDA - FOBT negative, ? Hemorrhoidal bleed  - GI consulted - no signs of active bleeding - repeat CBC in AM  DVT prophylaxis  SCD's  Code Status: Full Family Communication: Daughter at bedside Disposition Plan: Home when medically stable   IV Access:   Peripheral IV Procedures and diagnostic studies:    Ct Head Wo Contrast  2014/09/03    Extensive chronic small vessel white matter disease. Remote infarcts. No acute intracranial process.     Ct Abdomen Pelvis W Contrast  08/21/2014   No acute abdominal process. Cirrhosis with stigmata of portal venous hypertension.  Ectatic abdominal aorta at risk for aneurysm development. Recommend followup by [place] in 5 years. Medical Consultants:   GI Cardiology  Other Consultants:   Physical therapy  Anti-Infectives:   None  Faye Ramsay, MD  [facility] Pager [phone]  If 7PM-7AM, please contact night-coverage [email or web address] Password [email or web address] 08/23/2014, 2:44 PM   LOS: 1 day   HPI/Subjective: No events overnight.   Objective: Filed Vitals:   2014/09/03 2120 08/23/14 0517 08/23/14 1331 08/23/14 1431  BP: 120/38 109/45 99/53   Pulse: 66 75 72   Temp: 97.6 F (36.4 C) 97.8 F (36.6 C) 97.4 F (36.3 C)   TempSrc: Oral Oral Oral   Resp: 22 22 30    SpO2: 97% 94% 97% 97%    Intake/Output Summary (Last 24 hours) at 08/23/14 1444 Last  data filed at 08/22/14 1644  Gross per 24 hour  Intake    500 ml  Output    400 ml  Net    100 ml    Exam:   General:  Pt is somnolent but easy to arouse, NAD  Cardiovascular: Regular rate and rhythm, ectopic beats noted, no rubs, no gallops  Respiratory: Clear to auscultation bilaterally, no wheezing, diminished breath sounds at bases   Abdomen: Soft, non tender, non distended, bowel sounds present, no guarding  Extremities: No edema, pulses DP  and PT palpable bilaterally   Data Reviewed: Basic Metabolic Panel:  Recent Labs Lab 08/21/14 1830 08/22/14 1429 08/23/14 0450  NA 147 140 143  K 3.5* 4.7 3.9  CL 107 105 109  CO2 26 24 23   GLUCOSE 236* 168* 127*  BUN 9 7 7   CREATININE 0.70 0.73 0.68  CALCIUM 8.7 9.0 8.1*   Liver Function Tests:  Recent Labs Lab 08/21/14 1830 08/23/14 0450  AST 30 30  ALT 11 10  ALKPHOS 117 105  BILITOT 0.3 0.4  PROT 6.9 6.1  ALBUMIN 2.9* 2.7*   CBC:  Recent Labs Lab 08/21/14 1830 08/22/14 1429 08/23/14 0450  WBC 5.8 5.9 3.8*  NEUTROABS 3.6 3.8  --   HGB 11.4* 11.7* 10.0*  HCT 35.4* 36.5 31.5*  MCV 87.0 86.9 86.3  PLT 113* 122* 102*   Cardiac Enzymes:  Recent Labs Lab 08/22/14 1544  TROPONINI <0.30    Recent Results (from the past 240 hour(s))  Clostridium Difficile by PCR     Status: None   Collection Time: 08/23/14  7:50 AM  Result Value Ref Range Status   C difficile by pcr NEGATIVE NEGATIVE Final    Comment: Performed at [facility]     Scheduled Meds: . cholestyramine  4 g Oral Q12H  . clopidogrel  75 mg Oral Daily  . hydrocortisone   Rectal TID  . mometasone-formoterol  2 puff Inhalation BID  . nicotine  14 mg Transdermal Daily   Continuous Infusions:

## 2014-08-23 NOTE — Consult Note (Signed)
CARDIOLOGY CONSULT NOTE   Patient ID: Monique [facility] MRN: [id number] DOB/AGE: 1929-04-07 80 y.o.  Admit date: 08/22/2014  Primary Physician   Monique Homans, MD Primary Cardiologist   Dr. Johnsie [facility] Reason for Consultation   Chest pain  UMP:NTIRWE [name] is a 80 y.o. female with a history of vascular disease and AS, mean gradient 12 mm Hg and EF 70% by echo (not a surgical candidate). She also is HOH and has mild cognitive impairment.   She was admitted 11/18 with worsening diarrhea, which has been chronic since her colon cancer surgery. She had chest pain today and cardiology was asked to evaluate her.  [name] says she has chronic dyspnea on exertion. She does not believe it has changed much recently. She has had multiple episodes of chest pain, but only gets them about once a month.   Her answers are not consistent regarding whether the chest pain episodes occur with or without exertion. She cannot tell me the duration. Her dyspnea on exertion and possibly the chest pain will get better when she sits down and rests. She states the pain is unchanged by deep inspiration or cough. However, her midsternal area is very tender to palpation and this reproduces the pain. She states the pain was associated with shortness of breath and today it was also associated with mild nausea, no vomiting. There has been no diaphoresis. She has taken no medications for the pain. Today the pain reached a 7/10. She does not appear acutely uncomfortable at this time. She does not remember what she was doing when the pain started, but doesn't think she was doing anything.   Past Medical History  Diagnosis Date  . Aortic stenosis     moderate by echo 6/11 gradient 20/13mmHg for mean and peak  . TIA (transient ischemic attack)     Dr. Leonie [facility]  . Peripheral vascular disease   . IBS (irritable bowel syndrome)   . Diabetes mellitus     type II  . Chronic bronchitis   . COPD (chronic obstructive pulmonary  disease)   . Atherosclerosis   . Depression   . Anxiety     Dr Monique [facility]  . Renal cyst   . Anemia     iron deficiency  . Cellulitis   . Diverticular disease   . Infectious diarrhea(009.2)   . Sinusitis   . Thrush   . Abnormal liver function test   . GERD (gastroesophageal reflux disease)   . Insomnia     chronic  . Adenocarcinoma 2008    colon,s/p right hemicolectomy  . Angina   . Seizures     hx of one seizure "  . Cirrhosis of liver     hx of  . Clostridium difficile diarrhea   . Thrombocytopenia 04/01/2013  . Bursitis     spine per pt  . Macular degeneration 02/18/2014    Follows with Monique [facility]  . Internal hemorrhoids   . Asthma      Past Surgical History  Procedure Laterality Date  . Carotid endarterectomy    . Colectomy      right hemi  . Cholecystectomy  11/09  . Upper gastrointestinal endoscopy    . Tonsillectomy    . Appendectomy      Allergies  Allergen Reactions  . Bactrim [Sulfamethoxazole-Trimethoprim] Shortness Of Breath  . Levofloxacin Other (See Comments)    seizure  . Pneumovax [Pneumococcal Polysaccharide Vaccine] Other (See Comments)    Severe allergy-cellulitis  .  Amoxicillin-Pot Clavulanate Other (See Comments)    Patient stated Drs. recommend taking Augmentin due to her PCN allergy.  . Penicillins Rash  . Bupropion   . Haldol [Haloperidol Lactate]     Had unknown reaction many yrs ago per pt  . Haloperidol   . Morphine   . Morphine And Related     Confused and agitation  . Pneumococcal Vaccine   . Pseudoephedrine Hcl   . Sudafed [Pseudoephedrine Hcl] Other (See Comments)    seizure    I have reviewed the patient's current medications . sodium chloride   Intravenous STAT  . cholestyramine  4 g Oral Q12H  . clopidogrel  75 mg Oral Daily  . hydrocortisone   Rectal TID  . mometasone-formoterol  2 puff Inhalation BID  . nicotine  14 mg Transdermal Daily     acetaminophen **OR** acetaminophen, albuterol, clonazePAM, ondansetron  **OR** ondansetron (ZOFRAN) IV  Medication Sig  albuterol (PROAIR HFA) 108 (90 BASE) MCG/ACT inhaler Inhale 2 puffs into the lungs every 4 (four) hours as needed for wheezing or shortness of breath.  albuterol (PROVENTIL) (2.5 MG/3ML) 0.083% nebulizer solution Take 2.5 mg by nebulization every 4 (four) hours as needed for wheezing or shortness of breath.  cholestyramine (QUESTRAN) 4 G packet Take 4 g by mouth daily as needed (diarrhea).   clonazePAM (KLONOPIN) 1 MG tablet Take 1 mg by mouth 2 (two) times daily as needed for anxiety.   clopidogrel (PLAVIX) 75 MG tablet TAKE ONE (1) TABLET EACH DAY  cyanocobalamin 500 MCG tablet Take 500 mcg by mouth daily.  ferrous sulfate 325 (65 FE) MG tablet Take 650 mg by mouth daily after breakfast.  fluticasone (FLONASE) 50 MCG/ACT nasal spray Place 2 sprays into both nostrils daily. Patient taking differently: Place 2 sprays into both nostrils daily as needed for allergies.   Fluticasone-Salmeterol (ADVAIR) 250-50 MCG/DOSE AEPB Inhale 1 puff into the lungs 2 (two) times daily.  Multiple Vitamin (MULTIVITAMIN WITH MINERALS) TABS tablet Take 1 tablet by mouth daily.  Multiple Vitamins-Minerals (OCUVITE PRESERVISION) TABS Take by mouth 2 (two) times daily.  ondansetron (ZOFRAN) 4 MG tablet Take 1 tablet (4 mg total) by mouth every 6 (six) hours as needed for nausea or vomiting.  Polyethyl Glycol-Propyl Glycol (SYSTANE) 0.4-0.3 % SOLN Place 1 drop into both eyes 2 (two) times daily as needed.  potassium chloride (K-DUR) 10 MEQ tablet Take 10 mEq by mouth 2 (two) times daily.  saccharomyces boulardii (FLORASTOR) 250 MG capsule Take 250 mg by mouth daily.  sodium chloride (OCEAN) 0.65 % SOLN nasal spray Place 2 sprays into both nostrils daily.  traMADol (ULTRAM) 50 MG tablet TAKE ONE (1) TABLET EVERY 6 HOURS AS NEEDED FOR PAIN (MODERATE PAIN)     History   Social History  . Marital Status: Widowed    Spouse Name: N/A    Number of Children: 3  . Years of  Education: N/A   Occupational History  . retired   .     Social History Main Topics  . Smoking status: Former Smoker -- 0.50 packs/day for 70 years    Types: Cigarettes    Start date: 02/14/1951    Quit date: 01/03/2014  . Smokeless tobacco: Never Used     Comment: Still using  Electronic cigarette  . Alcohol Use: No  . Drug Use: No  . Sexual Activity: No   Other Topics Concern  . Not on file   Social History Narrative   Patient signed a [employer]  Party Release to allow her daughter, Elesha Thedford, to have access to her medical records/information. Entered by Fleet Contras March 24,2011 @ 2:47 pm   Dailly Caffeine Use    Family Status  Relation Status Death Age  . Mother Deceased   . Father Deceased    Family History  Problem Relation Age of Onset  . Colon cancer Paternal Grandmother   . Esophageal cancer Brother   . Heart disease Mother   . Diabetes Brother   . Hypertension Father   . Rectal cancer Neg Hx   . Stomach cancer Neg Hx      ROS:  Full 14 point review of systems complete and found to be negative unless listed above.  Physical Exam: Blood pressure 99/53, pulse 72, temperature 97.4 F (36.3 C), temperature source Oral, resp. rate 30, SpO2 97 %.  General: Well developed, well nourished, female in no acute distress Head: Eyes PERRLA, No xanthomas.   Normocephalic and atraumatic, oropharynx without edema or exudate. Dentition:  poor  Lungs:  few rales bases, slight wheeze noted  Heart: HRRR S1 S2, no rub/gallop 2/6 murmur. pulses are 2+  all 4 extrem.   Neck: No carotid bruits. No lymphadenopathy.  JVD minimal elevation . Abdomen: Bowel sounds present, abdomen soft and non-tender without masses or hernias noted. Msk:  No spine or cva tenderness. No weakness, no joint deformities or effusions. Extremities: No clubbing or cyanosis.  no  edema.  Neuro: Alert and oriented X 2. No focal deficits noted. Psych:  Good affect, responds appropriately Skin: No rashes  or lesions noted.  Labs:   Lab Results  Component Value Date   WBC 3.8* 08/23/2014   HGB 10.0* 08/23/2014   HCT 31.5* 08/23/2014   MCV 86.3 08/23/2014   PLT 102* 08/23/2014    Recent Labs Lab 08/23/14 0450  NA 143  K 3.9  CL 109  CO2 23  BUN 7  CREATININE 0.68  CALCIUM 8.1*  PROT 6.1  BILITOT 0.4  ALKPHOS 105  ALT 10  AST 30  GLUCOSE 127*  ALBUMIN 2.7*    Recent Labs  08/22/14 1544  TROPONINI <0.30   TSH  Date/Time Value Ref Range Status  08/22/2014 02:00 PM 2.420 0.350 - 4.500 uIU/mL Final    Comment:    Performed at [facility]   Echo: 01/16/2014 Conclusions - Left ventricle: The cavity size was normal. Wall thickness was increased in a pattern of mild LVH. Systolic function was vigorous. The estimated ejection fraction was in the range of 65% to 70%. Wall motion was normal; there were no regional wall motion abnormalities. Doppler parameters are consistent with abnormal left ventricular relaxation (grade 1 diastolic dysfunction). Doppler parameters are consistent with high ventricular filling pressure. - Aortic valve: Cusp separation was reduced. There was mild to moderate stenosis. Peak velocity: 277cm/s (S). Mean gradient: 58mm Hg (S). - Mitral valve: Calcified annulus. Impressions: - Since prior ECHO in 2012, aortic stenosis has mildly increased (prior peak velocity 2.32m/s, mean gradient 64mmHg).  ECG:  08/22/2014 Sinus rhythm, lateral T-wave changes noted  Radiology:  Ct Head Wo Contrast 08/22/2014   CLINICAL DATA:  80 year old with altered mental status, weakness, diarrhea  EXAM: CT HEAD WITHOUT CONTRAST  TECHNIQUE: Contiguous axial images were obtained from the base of the skull through the vertex without intravenous contrast.  COMPARISON:  06/21/2013  FINDINGS: There is diffuse, confluent, periventricular white matter hypodensity, compatible with chronic small vessel white matter disease.  Small remote infarcts  are  seen involving the right basal ganglia, right occipital lobe and right cerebellum.  Bilateral basal ganglia calcifications are present.  Cerebral and cerebellar atrophy are similar to prior.  There is no evidence of an acute infarct.  There is no acute intracranial hemorrhage.  There is no midline shift or mass effect.  There is no extra-axial fluid collection.  The paranasal sinuses and mastoid air cells are aerated.  The orbits are intact.  Sequela of prior cataract surgery is noted.  Vascular calcifications are noted in the region of the skull base.  IMPRESSION: 1. Extensive chronic small vessel white matter disease. 2. Remote infarcts. 3. No acute intracranial process.   Electronically Signed   By: Rosemarie Ax   On: 08/22/2014 15:11   Ct Abdomen Pelvis W Contrast 08/21/2014   CLINICAL DATA:  Abdominal pain and recurrent intermittent rectal bleeding onset 3 days ago. Fever and chills. HX: Hemorrhoids, Cirrhosis of liver. Pt states she had a colon resection x 13 years ago.  EXAM: CT ABDOMEN AND PELVIS WITH CONTRAST  TECHNIQUE: Multidetector CT imaging of the abdomen and pelvis was performed using the standard protocol following bolus administration of intravenous contrast.  CONTRAST:  29mL OMNIPAQUE IOHEXOL 300 MG/ML SOLN, 177mL OMNIPAQUE IOHEXOL 300 MG/ML SOLN  COMPARISON:  06/29/2014  FINDINGS: Subpleural blebs posteriorly in the visualized right lower lobe. Coronary calcifications. Aortoiliac arterial calcifications with ectatic 2.9 cm infrarenal aorta. Large bilateral renal cysts. Nodular liver contour without focal lesion. Surgical clips in the gallbladder fossa. Mild splenomegaly. Portal and splenic veins patent. Patent coronary vein to paraumbilical collaterals. Unremarkable adrenal glands, pancreas. Stomach, small bowel, and colon are nondilated. Changes of partial right hemicolectomy. No ascites. No free air. Urinary bladder physiologically distended. Bilateral pelvic vascular calcifications.  No adenopathy localized. Degenerative disc disease in the lower lumbar spine most marked L5-S1.  IMPRESSION: 1. No acute abdominal process. 2. Cirrhosis with stigmata of portal venous hypertension. 3. Ectatic abdominal aorta at risk for aneurysm development. Recommend followup by [place] in 5 years. This recommendation follows ACR consensus guidelines: White Paper of the ACR Incidental Findings Committee II on Vascular Findings. J Am Coll Radiol 2013; 10:789-794.   Electronically Signed   By: Arne Cleveland M.D.   On: 08/21/2014 21:35    ASSESSMENT AND PLAN:   The patient was seen today by Dr. Mare Ferrari, the patient evaluated and the data reviewed.   Principal Problem:   Acute diarrhea - per IM, 2 loose stools today  Active Problems:   COPD (chronic obstructive pulmonary disease) Gold B - per IM, may need additional inhalers or nebulizers.    Mild cognitive disorder - Per IM    General weakness - per IM    Dehydration - per IM, no obvious orthostatic symptoms    Hemorrhoids - per IM    Precordial pain - ez negative MI, ECG slightly different from previous ones. Continue to cycle enzymes. If they remain negative, MV in am. MD advise if OK to use Lexiscan with pt wheezing. Continue statin, no ASA since on Plavix, no BB with wheezing. BP on the low side so no ACE/ARB.     AS - last echo 6 months ago, mild AS w/ nl EF, follow.  SignedRosaria Ferries, PA-C 08/23/2014 3:17 PM Beeper [phone]  Co-Sign MD History as noted above. Her last echo was April 2015 and at that time the aortic stenosis was still mild with Peak 31 and mean17. She has known carotid artery disease followed by [facility].  Last carotid done April 2015 shows moderate disease. She has had atypical chest pain. On exam she is tender on sternal palpation. Initial troponin is normal. EKG shows new anterior wall T wave changes which could be ischemic. We will plan for lexiscan myoview in am.  She has mild wheezing on exam but  is in no respiratory distress. She may continue her home nebulizer treatments. She is still smoking a few cigarettes daily. I spoke with her about cessation. She has bilateral carotid bruits. There is a DUKRC3/8 systolic murmur at the base. a

## 2014-08-24 ENCOUNTER — Telehealth: Payer: Self-pay | Admitting: Family Medicine

## 2014-08-24 DIAGNOSIS — R072 Precordial pain: Secondary | ICD-10-CM

## 2014-08-24 DIAGNOSIS — R0789 Other chest pain: Secondary | ICD-10-CM | POA: Insufficient documentation

## 2014-08-24 DIAGNOSIS — I350 Nonrheumatic aortic (valve) stenosis: Secondary | ICD-10-CM

## 2014-08-24 DIAGNOSIS — R079 Chest pain, unspecified: Secondary | ICD-10-CM

## 2014-08-24 LAB — CBC
HCT: 32.2 % — ABNORMAL LOW (ref 36.0–46.0)
Hemoglobin: 10.2 g/dL — ABNORMAL LOW (ref 12.0–15.0)
MCH: 27.6 pg (ref 26.0–34.0)
MCHC: 31.7 g/dL (ref 30.0–36.0)
MCV: 87 fL (ref 78.0–100.0)
PLATELETS: 86 10*3/uL — AB (ref 150–400)
RBC: 3.7 MIL/uL — ABNORMAL LOW (ref 3.87–5.11)
RDW: 14.8 % (ref 11.5–15.5)
WBC: 4.7 10*3/uL (ref 4.0–10.5)

## 2014-08-24 LAB — BASIC METABOLIC PANEL
Anion gap: 13 (ref 5–15)
BUN: 9 mg/dL (ref 6–23)
CO2: 25 meq/L (ref 19–32)
CREATININE: 0.71 mg/dL (ref 0.50–1.10)
Calcium: 8.1 mg/dL — ABNORMAL LOW (ref 8.4–10.5)
Chloride: 107 mEq/L (ref 96–112)
GFR calc non Af Amer: 78 mL/min — ABNORMAL LOW (ref >90)
Glucose, Bld: 174 mg/dL — ABNORMAL HIGH (ref 70–99)
POTASSIUM: 4 meq/L (ref 3.7–5.3)
SODIUM: 145 meq/L (ref 137–147)

## 2014-08-24 LAB — IGA: IGA: 507 mg/dL — AB (ref 69–380)

## 2014-08-24 LAB — TISSUE TRANSGLUTAMINASE, IGA: TISSUE TRANSGLUTAMINASE AB, IGA: 35.1 U/mL — AB (ref <20)

## 2014-08-24 LAB — TROPONIN I

## 2014-08-24 MED ORDER — BOOST / RESOURCE BREEZE PO LIQD: 1.0000 | Freq: Two times a day (BID) | ORAL | Status: DC | PRN

## 2014-08-24 NOTE — Discharge Instructions (Signed)

## 2014-08-24 NOTE — Evaluation (Signed)
Physical Therapy Evaluation Patient Details Name: Monique [facility] MRN: [id number] DOB: 06-18-1929 Today's Date: 08/24/2014   History of Present Illness  80 yo female  adm  08/22/14 with worsening diarrhea; PMHx: colon CA, COPD  Clinical Impression  Pt will benefit from PT to address deficits below; May need SNF--no family present at time of eval and difficult to tell what pt's baseline was prior to adm    Follow Up Recommendations SNF (depending on home support, if 24hr assist initally may be able to go home with HHPT)    Equipment Recommendations  Rolling walker with 5" wheels    Recommendations for Other Services       Precautions / Restrictions Precautions Precautions: Fall      Mobility  Bed Mobility Overal bed mobility: Needs Assistance Bed Mobility: Supine to Sit     Supine to sit: Supervision;HOB elevated     General bed mobility comments: for safety, incr time, cues for task completion  Transfers Overall transfer level: Needs assistance Equipment used: Rolling walker (2 wheeled) Transfers: Sit to/from Stand Sit to Stand: Min assist         General transfer comment: cues for hand placement, safety, control of descent  Ambulation/Gait Ambulation/Gait assistance: Min assist Ambulation Distance (Feet): 30 Feet Assistive device: Rolling walker (2 wheeled) Gait Pattern/deviations: Step-through pattern;Decreased stride length;Narrow base of support;Trunk flexed;Drifts right/left     General Gait Details: verbal and visual cues for RW position from self, posture and step length  Stairs            Wheelchair Mobility    Modified Rankin (Stroke Patients Only)       Balance Overall balance assessment: Needs assistance Sitting-balance support: Feet supported;No upper extremity supported Sitting balance-Leahy Scale: Good       Standing balance-Leahy Scale: Poor Standing balance comment: requires support of walker to maintain standing today                             Pertinent Vitals/Pain Pain Assessment: No/denies pain    Home Living Family/patient expects to be discharged to:: Private residence Living Arrangements: Alone   Type of Home:  (townhouse)       Home Layout: Two level;Able to live on main level with bedroom/bathroom   Additional Comments: questionable historian; 2 local dtrs that both work; pt is vague and unclear about how she was functioning at home;     Prior Function           Comments: pt reports being independent, states she has "learned to hold on to the walls"     Hand Dominance        Extremity/Trunk Assessment   Upper Extremity Assessment: Defer to OT evaluation           Lower Extremity Assessment: Generalized weakness         Communication   Communication: No difficulties  Cognition Arousal/Alertness: Awake/alert Behavior During Therapy: WFL for tasks assessed/performed Overall Cognitive Status: No family/caregiver present to determine baseline cognitive functioning Area of Impairment: Following commands;Problem solving;Safety/judgement     Memory: Decreased short-term memory Following Commands: Follows one step commands inconsistently Safety/Judgement: Decreased awareness of safety;Decreased awareness of deficits   Problem Solving: Difficulty sequencing;Decreased initiation;Slow processing;Requires verbal cues;Requires tactile cues General Comments: pt repeats self frequently during PT eval    General Comments      Exercises        Assessment/Plan    PT  Assessment Patient needs continued PT services  PT Diagnosis Difficulty walking   PT Problem List Decreased strength;Decreased range of motion;Decreased activity tolerance;Decreased balance;Decreased mobility;Decreased knowledge of use of DME;Decreased safety awareness  PT Treatment Interventions DME instruction;Gait training;Functional mobility training;Therapeutic activities;Therapeutic  exercise;Patient/family education   PT Goals (Current goals can be found in the Care Plan section) Acute Rehab PT Goals Patient Stated Goal: wants to feel better PT Goal Formulation: With patient Time For Goal Achievement: 08/31/14 Potential to Achieve Goals: Good    Frequency Min 3X/week   Barriers to discharge        Co-evaluation               End of Session Equipment Utilized During Treatment: Gait belt Activity Tolerance: Patient limited by fatigue Patient left: in chair;with call bell/phone within reach Nurse Communication: Mobility status    Functional Assessment Tool Used: clinical judgement Functional Limitation: Mobility: Walking and moving around Mobility: Walking and Moving Around Current Status [phone]): At least 1 percent but less than 20 percent impaired, limited or restricted Mobility: Walking and Moving Around Goal Status [id number]): At least 1 percent but less than 20 percent impaired, limited or restricted    Time: 1015-1042 PT Time Calculation (min) (ACUTE ONLY): 27 min   Charges:   PT Evaluation $Initial PT Evaluation Tier I: 1 Procedure PT Treatments $Gait Training: 8-22 mins $Therapeutic Activity: 8-22 mins   PT G Codes:   Functional Assessment Tool Used: clinical judgement Functional Limitation: Mobility: Walking and moving around    [facility] 08/24/2014, 1:40 PM

## 2014-08-24 NOTE — Progress Notes (Signed)
     SUBJECTIVE: Only c/o nausea this am. No chest pain  BP 131/54 mmHg  Pulse 84  Temp(Src) 97.3 F (36.3 C) (Oral)  Resp 22  Ht 5' 5.5" (1.664 m)  Wt 116 lb 6.4 oz (52.799 kg)  BMI 19.07 kg/m2  SpO2 98%  Intake/Output Summary (Last 24 hours) at 08/24/14 6568 Last data filed at 08/23/14 1830  Gross per 24 hour  Intake    600 ml  Output      0 ml  Net    600 ml    PHYSICAL EXAM General: Well developed, well nourished, in no acute distress. Alert and oriented x 3.  Psych:  Good affect, responds appropriately Neck: No JVD. No masses noted.  Lungs: Clear bilaterally with no wheezes or rhonci noted.  Heart: RRR with no murmurs noted. Abdomen: Bowel sounds are present. Soft, non-tender.  Extremities: No lower extremity edema.   LABS: Basic Metabolic Panel:  Recent Labs  08/23/14 0450 08/24/14 0408  NA 143 145  K 3.9 4.0  CL 109 107  CO2 23 25  GLUCOSE 127* 174*  BUN 7 9  CREATININE 0.68 0.71  CALCIUM 8.1* 8.1*   CBC:  Recent Labs  08/21/14 1830 08/22/14 1429 08/23/14 0450 08/24/14 0408  WBC 5.8 5.9 3.8* 4.7  NEUTROABS 3.6 3.8  --   --   HGB 11.4* 11.7* 10.0* 10.2*  HCT 35.4* 36.5 31.5* 32.2*  MCV 87.0 86.9 86.3 87.0  PLT 113* 122* 102* 86*   Cardiac Enzymes:  Recent Labs  08/23/14 1405 08/23/14 1608 08/23/14 2208 08/24/14 0408  CKTOTAL 85  --   --   --   CKMB 2.7  --   --   --   TROPONINI  --  <0.30 <0.30 <0.30    Current Meds: . cholestyramine  4 g Oral Q12H  . clopidogrel  75 mg Oral Daily  . hydrocortisone   Rectal TID  . levalbuterol  1.25 mg Nebulization TID  . mometasone-formoterol  2 puff Inhalation BID  . nicotine  14 mg Transdermal Daily  . saccharomyces boulardii  250 mg Oral BID     ASSESSMENT AND PLAN:  1. Chest pain: Atypical. Troponin negative. Some pain with palpation of chest wall. EKG slightly changed. Plans in place for stress myoview this am. If no ischemia on stress test, then no further cardiac workup.   2.  Moderate AS by echo April 2015. Not likely related to current presentation   [place]  11/20/20157:13 AM

## 2014-08-24 NOTE — Progress Notes (Signed)
CARE MANAGEMENT NOTE 08/24/2014  Patient:  Monique Fleming,Monique Fleming   Account Number:  [id number]  Date Initiated:  08/24/2014  Documentation initiated by:  Edwyna Shell  Subjective/Objective Assessment:   80 yo female admitted with acute diarrhea from home alone, has PCP, and support from 2 daughters     Action/Plan:   discharge planning   Anticipated DC Date:  08/26/2014   Anticipated DC Plan:  [place] referral  Clinical Social Worker      DC Planning Services  CM consult      Choice offered to / List presented to:  C-4 Adult Children           Status of service:  In process, will continue to follow Medicare Important Message given?   (If response is "NO", the following Medicare IM given date fields will be blank) Date Medicare IM given:   Medicare IM given by:   Date Additional Medicare IM given:   Additional Medicare IM given by:    Discharge Disposition:  [place]  Per UR Regulation:    If discussed at Long Length of Stay Meetings, dates discussed:    Comments:  08/24/14 Edwyna Shell RN, BSN, [place] Patient stated that she lives home alone and does not have any DME in the home. She stated that she does not want the recommended rolling walker becasue she feels that will be more of a burden. Provided a list of [place] agencies to the patient and she requested that her daughter [name] choose the agency. Called [name] and she chose [facility] for [facility] services if the MD feels that [facility] services are appropriate upon discharge. She stated that upon discharge the patient will initially go home with one of the daughters before transitioning back to her house. [facility] rep, Stanton Kidney, was provided with the referra and [name] as the primary contactl, waiting MD orders for [facility].

## 2014-08-24 NOTE — Progress Notes (Signed)
Patient ID: Monique Fleming, female   DOB: [birth year], 80 y.o.   MRN: [id number]  [facility] PROGRESS NOTE  Monique Fleming PJA:[id number] DOB: 09-09-1929 DOA: 08/22/2014 PCP: Penni Homans, MD  Brief narrative: 80 y.o. female with COPD, colorectal cancer in the past, status post surgery, following which she developed chronic diarrhea. She typically has 2 loose bowel movements a day. She also has struggled with pain in the rectal area with hemorrhoids. Please note that daughter at bedside provided initial history as pt was too tired, HOH and has underlying mild cognitive impairment. Per daughter, pt has had several days duration of progressively worsening watery diarrhea, > 6 episodes per day with intermittent small amounts of blood in the stool. She was evaluated for this in ED several days ago and CT abd did not reveal any specific etiology and she was discharged home. Per daughter, this has been associated with progressive weakness, poor oral intake, malaise,, mostly sleeping at home. Daughter also explains pt has been recently treated for URI with Zithromax.   Assessment and Plan:    Principal Problem:  Acute diarrhea - unclear etiology, C. Diff negative  - requested stool GI panel, stool culture, O&P - continue supportive care with IVF, analgesia and antiemetics as needed  - GI consult requested and we appreciate assistance  Active Problems:  Chest pain - substernal, sudden onset, 7/10 in severity and radiating to the jaw, associated with one episode of vomiting - slightly hypotensive at the same tome - negative troponins, plan form myoview this AM  Acute on chronic hypoxic respiratory failure  - oxygen sat's in high 80's on RA with RR 28 - 30 bpm - unclear etiology - possibly related to underlying COPD, mild wheezing on exam - CXR negative   COPD (chronic obstructive pulmonary disease) Gold B  - continue BD's scheduled and as needed  General weakness - secondary  to acute problem, diarrhea, resulting dehydration - will need PT/OT once able to participate   Anemia, ? IDA - FOBT negative, ? Hemorrhoidal bleed  - GI consulted - no signs of active bleeding  DVT prophylaxis  SCD's  Code Status: Full Family Communication: Daughter at bedside Disposition Plan: Home when medically stable   IV Access:    Peripheral IV Procedures and diagnostic studies:    Ct Head Wo Contrast 08/22/2014 Extensive chronic small vessel white matter disease. Remote infarcts. No acute intracranial process.   Ct Abdomen Pelvis W Contrast 08/21/2014 No acute abdominal process. Cirrhosis with stigmata of portal venous hypertension. Ectatic abdominal aorta at risk for aneurysm development. Recommend followup by [place] in 5 years. Medical Consultants:    GI  Cardiology  Other Consultants:    Physical therapy  Anti-Infectives:    None   Faye Ramsay, MD  [facility] Pager [phone]  If 7PM-7AM, please contact night-coverage [email or web address] Password [facility] 08/24/2014, 7:46 AM   LOS: 2 days   HPI/Subjective: No events overnight.   Objective: Filed Vitals:   08/23/14 1837 08/23/14 2057 08/23/14 2100 08/24/14 0500  BP:   102/31 131/54  Pulse:   75 84  Temp:   97.6 F (36.4 C) 97.3 F (36.3 C)  TempSrc:   Oral Oral  Resp:   18 22  Height:      Weight: 52.799 kg (116 lb 6.4 oz)     SpO2:  97%  98%    Intake/Output Summary (Last 24 hours) at 08/24/14 0746 Last data filed at 08/23/14 1830  Gross per 24 hour  Intake    600 ml  Output      0 ml  Net    600 ml    Exam:   General:  Pt is alert, follows commands appropriately, not in acute distress  Cardiovascular: Regular rate and rhythm, SEM 3/6,  no rubs, no gallops  Respiratory: Clear to auscultation bilaterally, mild wheezing, no crackles, no rhonchi  Abdomen: Soft, non tender, non distended, bowel sounds present, no guarding Data Reviewed: Basic Metabolic Panel:  Recent  Labs Lab 08/21/14 1830 08/22/14 1429 08/23/14 0450 08/24/14 0408  NA 147 140 143 145  K 3.5* 4.7 3.9 4.0  CL 107 105 109 107  CO2 26 24 23 25   GLUCOSE 236* 168* 127* 174*  BUN 9 7 7 9   CREATININE 0.70 0.73 0.68 0.71  CALCIUM 8.7 9.0 8.1* 8.1*   Liver Function Tests:  Recent Labs Lab 08/21/14 1830 08/23/14 0450  AST 30 30  ALT 11 10  ALKPHOS 117 105  BILITOT 0.3 0.4  PROT 6.9 6.1  ALBUMIN 2.9* 2.7*   CBC:  Recent Labs Lab 08/21/14 1830 08/22/14 1429 08/23/14 0450 08/24/14 0408  WBC 5.8 5.9 3.8* 4.7  NEUTROABS 3.6 3.8  --   --   HGB 11.4* 11.7* 10.0* 10.2*  HCT 35.4* 36.5 31.5* 32.2*  MCV 87.0 86.9 86.3 87.0  PLT 113* 122* 102* 86*   Cardiac Enzymes:  Recent Labs Lab 08/22/14 1544 08/23/14 1405 08/23/14 1608 08/23/14 2208 08/24/14 0408  CKTOTAL  --  85  --   --   --   CKMB  --  2.7  --   --   --   TROPONINI <0.30  --  <0.30 <0.30 <0.30     Recent Results (from the past 240 hour(s))  Urine culture     Status: None   Collection Time: 08/22/14  4:44 PM  Result Value Ref Range Status   Specimen Description URINE, CATHETERIZED  Final   Special Requests NONE  Final   Culture  Setup Time   Final    08/22/2014 20:41 Performed at [place] Performed at [facility]   Final   Culture NO GROWTH Performed at [facility]   Final   Report Status 08/23/2014 FINAL  Final  Clostridium Difficile by PCR     Status: None   Collection Time: 08/23/14  7:50 AM  Result Value Ref Range Status   C difficile by pcr NEGATIVE NEGATIVE Final    Comment: Performed at [facility]     Scheduled Meds: . cholestyramine  4 g Oral Q12H  . clopidogrel  75 mg Oral Daily  . hydrocortisone   Rectal TID  . levalbuterol  1.25 mg Nebulization TID  . mometasone-formoterol  2 puff Inhalation BID  . nicotine  14 mg Transdermal Daily  . saccharomyces boulardii  250 mg Oral BID   Continuous Infusions:

## 2014-08-24 NOTE — Discharge Summary (Addendum)
Physician Discharge Summary  Monique Fleming PHX:[id number] DOB: 1928-12-20 DOA: 08/22/2014  PCP: Penni Homans, MD  Admit date: 08/22/2014 Discharge date: 08/24/2014  Recommendations for Outpatient Follow-up:  1. Pt will need to follow up with PCP in 2-3 weeks post discharge 2. Please obtain BMP to evaluate electrolytes and kidney function 3. Please also check CBC to evaluate Hg and Hct levels 4. Please note that pt was scheduled for stress test and information given   Discharge Diagnoses:  Principal Problem:   Acute diarrhea Active Problems:   Aortic stenosis   COPD (chronic obstructive pulmonary disease) Gold B    Mild cognitive disorder   General weakness   Dehydration   Hemorrhoids   Chest pain   Chest pain, mid sternal    Discharge Condition: Stable  Diet recommendation: Heart healthy diet discussed in details    Brief narrative: 80 y.o. female with COPD, colorectal cancer in the past, status post surgery, following which she developed chronic diarrhea. She typically has 2 loose bowel movements a day. She also has struggled with pain in the rectal area with hemorrhoids. Please note that daughter at bedside provided initial history as pt was too tired, HOH and has underlying mild cognitive impairment. Per daughter, pt has had several days duration of progressively worsening watery diarrhea, > 6 episodes per day with intermittent small amounts of blood in the stool. She was evaluated for this in ED several days ago and CT abd did not reveal any specific etiology and she was discharged home. Per daughter, this has been associated with progressive weakness, poor oral intake, malaise,, mostly sleeping at home. Daughter also explains pt has been recently treated for URI with Zithromax.   Assessment and Plan:    Principal Problem:  Acute diarrhea - unclear etiology, C. Diff negative  - requested stool GI panel, stool culture, O&P - no diarrhea  - continue  supportive care with antiemetics as needed  Active Problems:  Chest pain  - substernal, sudden onset, 7/10 in severity and radiating to the jaw, associated with one episode of vomiting - negative troponins, now resolved - plan to do Myoview in an outpatient setting as pt insisting on going home   Acute on chronic hypoxic respiratory failure  - oxygen sat's in high 80's on RA with RR 28 - 30 bpm - possibly related to underlying COPD, mild wheezing on exam - CXR negative  - now oxygen WNL   COPD (chronic obstructive pulmonary disease) Gold B  - continue BD's scheduled and as needed  General weakness - secondary to acute problem, diarrhea, resulting dehydration - will need PT/OT upon discharge to continue at home  Anemia, ? IDA - FOBT negative, ? Hemorrhoidal bleed  - GI consulted - no signs of active bleeding - no indication for an intervention   DVT prophylaxis  SCD's  Code Status: Full Family Communication: Daughter at bedside Disposition Plan: Home when medically stable   IV Access:    Peripheral IV Procedures and diagnostic studies:    Ct Head Wo Contrast 08/22/2014 Extensive chronic small vessel white matter disease. Remote infarcts. No acute intracranial process.   Ct Abdomen Pelvis W Contrast 08/21/2014 No acute abdominal process. Cirrhosis with stigmata of portal venous hypertension. Ectatic abdominal aorta at risk for aneurysm development. Recommend followup by [place] in 5 years. Medical Consultants:    GI  Cardiology  Other Consultants:    Physical therapy  Anti-Infectives:    None   Discharge Exam: Filed Vitals:  08/24/14 1433  BP: 155/55  Pulse: 82  Temp: 97.9 F (36.6 C)  Resp: 20   Filed Vitals:   08/23/14 2100 08/24/14 0500 08/24/14 1011 08/24/14 1433  BP: 102/31 131/54  155/55  Pulse: 75 84  82  Temp: 97.6 F (36.4 C) 97.3 F (36.3 C)  97.9 F (36.6 C)  TempSrc: Oral Oral  Oral  Resp: 18 22  20    Height:      Weight:      SpO2:  98% 98% 96%    General: Pt is alert, follows commands appropriately, not in acute distress Cardiovascular: Regular rate and rhythm, no rubs, no gallops Respiratory: Clear to auscultation bilaterally, no wheezing, no crackles, no rhonchi Abdominal: Soft, non tender, non distended, bowel sounds +, no guarding Extremities: no edema, no cyanosis, pulses palpable bilaterally DP and PT Neuro: Grossly nonfocal  Discharge Instructions  Discharge Instructions    Diet - low sodium heart healthy    Complete by:  As directed      Increase activity slowly    Complete by:  As directed             Medication List    TAKE these medications        cholestyramine 4 G packet  Commonly known as:  QUESTRAN  Take 4 g by mouth daily as needed (diarrhea).     clonazePAM 1 MG tablet  Commonly known as:  KLONOPIN  Take 1 mg by mouth 2 (two) times daily as needed for anxiety.     clopidogrel 75 MG tablet  Commonly known as:  PLAVIX  TAKE ONE (1) TABLET EACH DAY     cyanocobalamin 500 MCG tablet  Take 500 mcg by mouth daily.     ferrous sulfate 325 (65 FE) MG tablet  Take 650 mg by mouth daily after breakfast.     fluticasone 50 MCG/ACT nasal spray  Commonly known as:  FLONASE  Place 2 sprays into both nostrils daily.     Fluticasone-Salmeterol 250-50 MCG/DOSE Aepb  Commonly known as:  ADVAIR  Inhale 1 puff into the lungs 2 (two) times daily.     multivitamin with minerals Tabs tablet  Take 1 tablet by mouth daily.     OCUVITE PRESERVISION Tabs  Take by mouth 2 (two) times daily.     ondansetron 4 MG tablet  Commonly known as:  ZOFRAN  Take 1 tablet (4 mg total) by mouth every 6 (six) hours as needed for nausea or vomiting.     potassium chloride 10 MEQ tablet  Commonly known as:  K-DUR  Take 10 mEq by mouth 2 (two) times daily.     PROAIR HFA 108 (90 BASE) MCG/ACT inhaler  Generic drug:  albuterol  Inhale 2 puffs into the lungs every 4  (four) hours as needed for wheezing or shortness of breath.     albuterol (2.5 MG/3ML) 0.083% nebulizer solution  Commonly known as:  PROVENTIL  Take 2.5 mg by nebulization every 4 (four) hours as needed for wheezing or shortness of breath.     saccharomyces boulardii 250 MG capsule  Commonly known as:  FLORASTOR  Take 250 mg by mouth daily.     sodium chloride 0.65 % Soln nasal spray  Commonly known as:  OCEAN  Place 2 sprays into both nostrils daily.     SYSTANE 0.4-0.3 % Soln  Generic drug:  Polyethyl Glycol-Propyl Glycol  Place 1 drop into both eyes 2 (two) times daily as needed.  traMADol 50 MG tablet  Commonly known as:  ULTRAM  TAKE ONE (1) TABLET EVERY 6 HOURS AS NEEDED FOR PAIN (MODERATE PAIN)           Follow-up Information    Follow up with Penni Homans, MD.   Specialty:  Family Medicine   Contact information:   [place] [address] [phone]       Follow up with Murray Hodgkins, NP On 08/28/2014.   Specialty:  Nurse Practitioner   Why:  appointment scheduled at 14:30 pm   Contact information:   [address] [place] [address] [phone]       Follow up with Scarlette Shorts, MD.   Specialty:  Gastroenterology   Contact information:   [address] [place] [address] [phone]        The results of significant diagnostics from this hospitalization (including imaging, microbiology, ancillary and laboratory) are listed below for reference.     Microbiology: Recent Results (from the past 240 hour(s))  Urine culture     Status: None   Collection Time: 08/22/14  4:44 PM  Result Value Ref Range Status   Specimen Description URINE, CATHETERIZED  Final   Special Requests NONE  Final   Culture  Setup Time   Final    08/22/2014 20:41 Performed at [place] Performed at [facility]   Final   Culture NO GROWTH Performed at [facility]   Final   Report Status 08/23/2014 FINAL  Final  Clostridium Difficile by PCR     Status: None   Collection Time: 08/23/14  7:50 AM  Result Value Ref Range Status   C difficile by pcr NEGATIVE NEGATIVE Final    Comment: Performed at [place]: Basic Metabolic Panel:  Recent Labs Lab 08/21/14 1830 08/22/14 1429 08/23/14 0450 08/24/14 0408  NA 147 140 143 145  K 3.5* 4.7 3.9 4.0  CL 107 105 109 107  CO2 26 24 23 25   GLUCOSE 236* 168* 127* 174*  BUN 9 7 7 9   CREATININE 0.70 0.73 0.68 0.71  CALCIUM 8.7 9.0 8.1* 8.1*   Liver Function Tests:  Recent Labs Lab 08/21/14 1830 08/23/14 0450  AST 30 30  ALT 11 10  ALKPHOS 117 105  BILITOT 0.3 0.4  PROT 6.9 6.1  ALBUMIN 2.9* 2.7*   No results for input(s): LIPASE, AMYLASE in the last 168 hours. No results for input(s): AMMONIA in the last 168 hours. CBC:  Recent Labs Lab 08/21/14 1830 08/22/14 1429 08/23/14 0450 08/24/14 0408  WBC 5.8 5.9 3.8* 4.7  NEUTROABS 3.6 3.8  --   --   HGB 11.4* 11.7* 10.0* 10.2*  HCT 35.4* 36.5 31.5* 32.2*  MCV 87.0 86.9 86.3 87.0  PLT 113* 122* 102* 86*   Cardiac Enzymes:  Recent Labs Lab 08/22/14 1544 08/23/14 1405 08/23/14 1608 08/23/14 2208 08/24/14 0408  CKTOTAL  --  85  --   --   --   CKMB  --  2.7  --   --   --   TROPONINI <0.30  --  <0.30 <0.30 <0.30   BNP: BNP (last 3 results)  Recent Labs  03/14/14 1214  PROBNP 604.7*   CBG: No results for input(s): GLUCAP in the last 168 hours.   SIGNED: Time coordinating discharge: Over 30 minutes  MAGICK-Graceanne Guin, Rebecca Eaton, MD  [facility] 08/24/2014, 2:59 PM Pager [phone]  If 7PM-7AM, please contact night-coverage [email or web address] Password [email or web address]

## 2014-08-24 NOTE — Progress Notes (Signed)
[facility] Progress Note  Subjective:  NO abd pain. No further diarrhea since last pm. Waves of nausea this morning but no vomiting. NO chest pain. For stress myoview this am.   Objective:  Vital signs in last 24 hours: Temp:  [97.3 F (36.3 C)-97.6 F (36.4 C)] 97.3 F (36.3 C) (11/20 0500) Pulse Rate:  [72-84] 84 (11/20 0500) Resp:  [18-30] 22 (11/20 0500) BP: (99-131)/(31-54) 131/54 mmHg (11/20 0500) SpO2:  [97 %-98 %] 98 % (11/20 0500) Weight:  [116 lb 6.4 oz (52.799 kg)] 116 lb 6.4 oz (52.799 kg) (11/19 1837) Last BM Date: 08/23/14 General:   Alert,  Well-developed,    in NAD Heart:  Regular rate and rhythm; no murmurs Pulm;lungs clear Abdomen:  Soft, nontender and nondistended. Normal bowel sounds, without guarding, and without rebound.   Extremities:  Without edema. Neurologic:  Alert and  oriented x4;  grossly normal neurologically. Psych:  Alert and cooperative. Normal mood and affect.  Intake/Output from previous day: 11/19 0701 - 11/20 0700 In: 600 [P.O.:600] Out: -  Intake/Output this shift: Total I/O In: -  Out: 100 [Urine:100]  Lab Results:  Recent Labs  08/22/14 1429 08/23/14 0450 08/24/14 0408  WBC 5.9 3.8* 4.7  HGB 11.7* 10.0* 10.2*  HCT 36.5 31.5* 32.2*  PLT 122* 102* 86*   BMET  Recent Labs  08/22/14 1429 08/23/14 0450 08/24/14 0408  NA 140 143 145  K 4.7 3.9 4.0  CL 105 109 107  CO2 24 23 25   GLUCOSE 168* 127* 174*  BUN 7 7 9   CREATININE 0.73 0.68 0.71  CALCIUM 9.0 8.1* 8.1*   LFT  Recent Labs  08/23/14 0450  PROT 6.1  ALBUMIN 2.7*  AST 30  ALT 10  ALKPHOS 105  BILITOT 0.4   Stool for c diff 08/23/14 negative   Ct Head Wo Contrast  08/22/2014   CLINICAL DATA:  80 year old with altered mental status, weakness, diarrhea  EXAM: CT HEAD WITHOUT CONTRAST  TECHNIQUE: Contiguous axial images were obtained from the base of the skull through the vertex without intravenous contrast.  COMPARISON:  06/21/2013   FINDINGS: There is diffuse, confluent, periventricular white matter hypodensity, compatible with chronic small vessel white matter disease.  Small remote infarcts are seen involving the right basal ganglia, right occipital lobe and right cerebellum.  Bilateral basal ganglia calcifications are present.  Cerebral and cerebellar atrophy are similar to prior.  There is no evidence of an acute infarct.  There is no acute intracranial hemorrhage.  There is no midline shift or mass effect.  There is no extra-axial fluid collection.  The paranasal sinuses and mastoid air cells are aerated.  The orbits are intact.  Sequela of prior cataract surgery is noted.  Vascular calcifications are noted in the region of the skull base.  IMPRESSION: 1. Extensive chronic small vessel white matter disease. 2. Remote infarcts. 3. No acute intracranial process.   Electronically Signed   By: Rosemarie Ax   On: 08/22/2014 15:11   Dg Chest Port 1 View  08/23/2014   CLINICAL DATA:  Left lower chest pain, shortness of Breath  EXAM: PORTABLE CHEST - 1 VIEW  COMPARISON:  05/21/2014  FINDINGS: Cardiomediastinal silhouette is stable. No acute infiltrate or pleural effusion. No pulmonary edema. Bony thorax is unremarkable.  IMPRESSION: No active disease.   Electronically Signed   By: Lahoma Crocker M.D.   On: 08/23/2014 15:19    ASSESSMENT/PLAN:  80 yo female with chroninc  diarrhea admitted with exacerbation after completion of abx for URI. C diff neg. Stool path panel has not been obtained since pt has not had another BM.Would advance to clears after cardiac testing completed. If  Diarrhea flares, may consider empiric trial of flagyl.      LOS: 2 days   Hvozdovic, Deloris Ping 08/24/2014, Pager [phone] Attending MD note:   I have taken a history, examined the patient, and reviewed the chart. I agree with the Advanced Practitioner's impression and recommendations.No stools x 12 hours, ,therefore no specimen obtained. If diarrhea  recurrs would start Flagy 250 mh po tid x 7-10 days. Will sigh off   [facility] Pager # [phone]

## 2014-08-24 NOTE — Evaluation (Signed)
Occupational Therapy Evaluation Patient Details Name: Monique Fleming MRN: [id number] DOB: Sep 09, 1929 Today's Date: 08/24/2014    History of Present Illness 80 yo female  adm  08/22/14 with worsening diarrhea; PMHx: colon CA, COPD   Clinical Impression   Pt up to [facility] with OT. For test later this am. No family present to discuss d/c planning but feel she will need SNF at d/c unless family available and can provide 24/7 assist for safety. Pt very fixated on finding out what time her test is today. Informed nursing of pt inquiry. Will follow to progress safety and independence with self care.     Follow Up Recommendations  SNF;Supervision/Assistance - 24 hour    Equipment Recommendations  3 in 1 bedside comode    Recommendations for Other Services       Precautions / Restrictions Precautions Precautions: Fall      Mobility Bed Mobility Overal bed mobility: Needs Assistance Bed Mobility: Supine to Sit;Sit to Supine     Supine to sit: Supervision;HOB elevated Sit to supine: Supervision;HOB elevated   General bed mobility comments: for safety, incr time, cues for task completion  Transfers Overall transfer level: Needs assistance Equipment used: None Transfers: Sit to/from Stand Sit to Stand: Min assist         General transfer comment: min assist to steady.    Balance Overall balance assessment: Needs assistance Sitting-balance support: Feet supported;No upper extremity supported Sitting balance-Leahy Scale: Good                                    ADL Overall ADL's : Needs assistance/impaired Eating/Feeding: NPO   Grooming: Wash/dry hands;Set up;Sitting   Upper Body Bathing: Set up;Sitting   Lower Body Bathing: Minimal assistance;Sit to/from stand   Upper Body Dressing : Set up;Sitting   Lower Body Dressing: Minimal assistance;Sit to/from stand   Toilet Transfer: Minimal assistance;Stand-pivot;BSC   Toileting- Clothing Manipulation and  Hygiene: Minimal assistance;Sit to/from stand         General ADL Comments: Pt doesnt give many details about PLOF. She was very focused on what time her test is today and was upset that she didnt know the time of it. Informed nursing. NOt sure what her family support is for d/c planning.      Vision                     Perception     Praxis      Pertinent Vitals/Pain Pain Assessment: No/denies pain     Hand Dominance     Extremity/Trunk Assessment Upper Extremity Assessment Upper Extremity Assessment: Generalized weakness          Communication Communication Communication: No difficulties   Cognition Arousal/Alertness: Awake/alert Behavior During Therapy: WFL for tasks assessed/performed Overall Cognitive Status: No family/caregiver present to determine baseline cognitive functioning Area of Impairment: Following commands;Safety/judgement     Memory: Decreased short-term memory Following Commands: Follows one step commands inconsistently Safety/Judgement: Decreased awareness of safety;Decreased awareness of deficits   Problem Solving: Requires verbal cues;Requires tactile cues    General Comments       Exercises       Shoulder Instructions      Home Living Family/patient expects to be discharged to:: Private residence Living Arrangements: Alone   Type of Home:  (townhouse)       Home Layout: Two level;Able to live on main level with  bedroom/bathroom     Bathroom Shower/Tub: Teacher, early years/pre: Standard     Home Equipment: Grab bars - tub/shower;Shower seat   Additional Comments: questionable historian; 2 local dtrs that both work; pt is vague and unclear about how she was functioning at home;       Prior Functioning/Environment Level of Independence: Needs assistance    ADL's / Homemaking Assistance Needed: laundry assist.   Comments: pt reports being independent, states she has "learned to hold on to the walls"   she states she doesnt cook much just simple meals. Daughter help with laundry    OT Diagnosis: Generalized weakness   OT Problem List: Decreased strength;Decreased knowledge of use of DME or AE   OT Treatment/Interventions: Self-care/ADL training;Patient/family education;Therapeutic activities;DME and/or AE instruction    OT Goals(Current goals can be found in the care plan section) Acute Rehab OT Goals Patient Stated Goal: to find out what time her test is today OT Goal Formulation: With patient Time For Goal Achievement: 09/07/14 Potential to Achieve Goals: Good  OT Frequency: Min 2X/week   Barriers to D/C:            Co-evaluation              End of Session    Activity Tolerance: Patient tolerated treatment well Patient left: in bed;with call bell/phone within reach   Time: 1248-1310 OT Time Calculation (min): 22 min Charges:  OT General Charges $OT Visit: 1 Procedure OT Evaluation $Initial OT Evaluation Tier I: 1 Procedure OT Treatments $Therapeutic Activity: 8-22 mins G-Codes: OT G-codes **NOT FOR INPATIENT CLASS** Functional Assessment Tool Used: clinical judgement Functional Limitation: Self care Self Care Current Status (Q2595): At least 1 percent but less than 20 percent impaired, limited or restricted Self Care Goal Status (G3875): At least 1 percent but less than 20 percent impaired, limited or restricted  [place], Rew 08/24/2014, 1:54 PM

## 2014-08-24 NOTE — Progress Notes (Signed)
CARE MANAGEMENT NOTE 08/24/2014  Patient:  Monique Fleming,Monique Fleming   Account Number:  [id number]  Date Initiated:  08/24/2014  Documentation initiated by:  Edwyna Shell  Subjective/Objective Assessment:   80 yo female admitted with acute diarrhea from home alone, has PCP, and support from 2 daughters     Action/Plan:   discharge planning   Anticipated DC Date:  08/26/2014   Anticipated DC Plan:  [facility] referral  Clinical Social Worker      DC Planning Services  CM consult      Choice offered to / List presented to:  C-4 Adult Children        [place] arranged  HH-1 RN  [place] agency  [place]   Status of service:  Completed, signed off Medicare Important Message given?   (If response is "NO", the following Medicare IM given date fields will be blank) Date Medicare IM given:   Medicare IM given by:   Date Additional Medicare IM given:   Additional Medicare IM given by:    Discharge Disposition:  [place]  Per UR Regulation:    If discussed at Long Length of Stay Meetings, dates discussed:    Comments:  08/24/14 Edwyna Shell RN, BSN, [place] Patient stated that she lives home alone and does not have any DME in the home. She stated that she does not want the recommended rolling walker becasue she feels that will be more of a burden. Provided a list of [place] agencies to the patient and she requested that her daughter Monique Fleming choose the agency. Called Monique Fleming and she chose [facility] for [facility] services if the MD feels that [facility] services are appropriate upon discharge. She stated that upon discharge the patient will initially go home with one of the daughters before transitioning back to her house. [facility] rep, Stanton Kidney, was provided with the referra and Monique Fleming as the primary contact.

## 2014-08-24 NOTE — Progress Notes (Signed)
UR completed 

## 2014-08-24 NOTE — Progress Notes (Signed)
INITIAL NUTRITION ASSESSMENT  DOCUMENTATION CODES Per approved criteria  -Not Applicable   INTERVENTION: -Recommend low fiber supplement alternative Resource Breeze po BID, each supplement provides 250 kcal and 9 grams of protein -RD to continue to monitor  NUTRITION DIAGNOSIS: Inadequate oral intake related to loose stools as evidenced by pt report.   Goal: Pt to meet >/= 90% of their estimated nutrition needs    Monitor:  Total protein/energy intake, labs, weights, supplement tolerance  Reason for Assessment: MST  80 y.o. female  Admitting Dx: Acute diarrhea  ASSESSMENT: Monique Fleming is a 80 y.o. female with a past medical history of COPD, colorectal cancer in the past, status post surgery, following which she developed chronic diarrhea. She would have 2 bowel movements a day which would be loose. She also has struggled with pain in the rectal area with hemorrhoids. Occasionally the hemorrhoids would bleeding. Apparently for the last 4 days the frequency of diarrhea has worsened. She is having 6-8 bowel movements every day  -Pt slightly confused during time of RD assessment, was not able to provide detailed food/nutrition hx -Pt confirmed having poor PO intake pta. Did not elaborate on diet recall. Ate 100% of full liquid meal as she had NPO status prior to that and was eager to eat. Pt does not enjoy taste of Ensure or Boost; however was willing to consume Resource Breeze -Pt confirmed unintentional weight loss. Previous medical records indicates pt with 16 lb weight loss in past one week, which is likely related to dehydration from increased bowel movements -MD noted pt loose stools improving, is C.diff negative   Height: Ht Readings from Last 1 Encounters:  08/23/14 5' 5.5" (1.664 m)    Weight: Wt Readings from Last 1 Encounters:  08/23/14 116 lb 6.4 oz (52.799 kg)    Ideal Body Weight: 125 lb  % Ideal Body Weight: 93%  Wt Readings from Last 10 Encounters:   08/23/14 116 lb 6.4 oz (52.799 kg)  08/21/14 133 lb (60.328 kg)  08/16/14 134 lb (60.782 kg)  08/14/14 133 lb 6 oz (60.499 kg)  08/03/14 133 lb (60.328 kg)  08/02/14 132 lb (59.875 kg)  07/03/14 133 lb (60.328 kg)  05/24/14 132 lb (59.875 kg)  05/21/14 132 lb 4 oz (59.988 kg)  05/16/14 129 lb 4 oz (58.627 kg)    Usual Body Weight: 133 lb per med records  % Usual Body Weight: 88%  BMI:  Body mass index is 19.07 kg/(m^2).  Estimated Nutritional Needs: Kcal: 1400-1600 Protein: 65-75 gram Fluid: >/=1600 ml daily  Skin: WDL  Diet Order: DIET SOFT Diet NPO time specified Diet - low sodium heart healthy  EDUCATION NEEDS: -Education not appropriate at this time   Intake/Output Summary (Last 24 hours) at 08/24/14 1601 Last data filed at 08/24/14 1434  Gross per 24 hour  Intake    600 ml  Output    200 ml  Net    400 ml    Last BM: 11/19   Labs:   Recent Labs Lab 08/22/14 1429 08/23/14 0450 08/24/14 0408  NA 140 143 145  K 4.7 3.9 4.0  CL 105 109 107  CO2 24 23 25   BUN 7 7 9   CREATININE 0.73 0.68 0.71  CALCIUM 9.0 8.1* 8.1*  GLUCOSE 168* 127* 174*    CBG (last 3)  No results for input(s): GLUCAP in the last 72 hours.  Scheduled Meds: . cholestyramine  4 g Oral Q12H  . clopidogrel  75 mg  Oral Daily  . hydrocortisone   Rectal TID  . levalbuterol  1.25 mg Nebulization TID  . mometasone-formoterol  2 puff Inhalation BID  . nicotine  14 mg Transdermal Daily  . saccharomyces boulardii  250 mg Oral BID    Continuous Infusions:   Past Medical History  Diagnosis Date  . Aortic stenosis     moderate by echo 6/11 gradient 20/62mmHg for mean and peak  . TIA (transient ischemic attack)     Dr. [name]  . Peripheral vascular disease   . IBS (irritable bowel syndrome)   . Diabetes mellitus     type II  . Chronic bronchitis   . COPD (chronic obstructive pulmonary disease)   . Atherosclerosis   . Depression   . Anxiety     Dr Casimiro Needle  . Renal cyst   .  Anemia     iron deficiency  . Cellulitis   . Diverticular disease   . Infectious diarrhea(009.2)   . Sinusitis   . Thrush   . Abnormal liver function test   . GERD (gastroesophageal reflux disease)   . Insomnia     chronic  . Adenocarcinoma 2008    colon,s/p right hemicolectomy  . Angina   . Seizures     hx of one seizure "  . Cirrhosis of liver     hx of  . Clostridium difficile diarrhea   . Thrombocytopenia 04/01/2013  . Bursitis     spine per pt  . Macular degeneration 02/18/2014    Follows with [facility]  . Internal hemorrhoids   . Asthma     Past Surgical History  Procedure Laterality Date  . Carotid endarterectomy    . Colectomy      right hemi  . Cholecystectomy  11/09  . Upper gastrointestinal endoscopy    . Tonsillectomy    . Appendectomy      Doddridge Ogilvie Clinical Dietitian [phone]

## 2014-08-24 NOTE — Telephone Encounter (Signed)
emmi mailed  °

## 2014-08-24 NOTE — Progress Notes (Signed)
Clinical Social Work Department BRIEF PSYCHOSOCIAL ASSESSMENT 08/24/2014  Patient:  Monique Fleming,Monique Fleming     Account Number:  [id number]     Admit date:  08/22/2014  Clinical Social Worker:  Earlie Server  Date/Time:  08/24/2014 02:30 PM  Referred by:  Physician  Date Referred:  08/24/2014 Referred for  SNF Placement   Other Referral:   Interview type:  Patient Other interview type:    PSYCHOSOCIAL DATA Living Status:  ALONE Admitted from facility:   Level of care:   Primary support name:  Betsy Primary support relationship to patient:  CHILD, ADULT Degree of support available:   Strong    CURRENT CONCERNS Current Concerns  Post-Acute Placement   Other Concerns:    SOCIAL WORK ASSESSMENT / PLAN CSW received referral in order to assist with DC planning. Per chart review, PT recommending SNF placement but patient does not have an inpatient qualifying stay. CSW and CM met with patient at bedside to discuss plans.    Patient reports she lives at home alone but her two dtrs live nearby and are very supportive. Patient reports she is ready to DC from the hospital and will return back home with family support. Patient reports she is fully independent at home and still drives. Patient agreeable to return home with dtr Monique Fleming) and agreeable to [facility]. CM to arrange [facility] needs.    CSW is signing off but available if needed.   Assessment/plan status:  No Further Intervention Required Other assessment/ plan:   Information/referral to community resources:   SNF information    PATIENT'S/FAMILY'S RESPONSE TO PLAN OF CARE: Patient alert and engaged in assessment. Patient reports that she is grateful for family and will only return home. Patient is not agreeable to SNF and only wants to return home. Family aware of DC plans and agreeable to assist as needed with the support of HH. No further CSW needs at this time.      [place], [place] [phone]

## 2014-08-27 ENCOUNTER — Telehealth: Payer: Self-pay

## 2014-08-27 NOTE — Telephone Encounter (Signed)
Admit date: 08/22/2014 Discharge date: 08/24/2014  Reason for admission: Diarrhea  Recommendations for Outpatient Follow-up:  1. Pt will need to follow up with PCP in 2-3 weeks post discharge 2. Please obtain BMP to evaluate electrolytes and kidney function 3. Please also check CBC to evaluate Hg and Hct levels 4. Please note that pt was scheduled for stress test and information given   Transition Care Management Follow-up Telephone Call  How have you been since you were released from the hospital?  States she's feeling better today. Diarrhea resolved.  Eating without difficulty.  States she is eating small meals.  Staying hydrated.  Physical therapy scheduled.     Do you understand why you were in the hospital? yes   Do you understand the discharge instrcutions? yes  Items Reviewed:  Medications reviewed: yes  Allergies reviewed: yes  Dietary changes reviewed: yes,   Referrals reviewed:  GI and Cardio appointments scheduled.     Functional Questionnaire:   Activities of Daily Living (ADLs):   She states they are independent in the following: ambulation, bathing and hygiene, feeding, continence, grooming, toileting and dressing States they require assistance with the following: none   Any transportation issues/concerns?: no   Any patient concerns? Needs a refill on Dilantin and albuterol.  Would like a 90 day supply.     Confirmed importance and date/time of follow-up visits scheduled: yes   Confirmed with patient if condition begins to worsen call PCP or go to the ER.  Patient was given the Call-a-Nurse line [phone]: yes   Hospital Follow up appointment scheduled with Dr. Charlett Blake for 09/04/14 @ 5:45 pm.

## 2014-08-28 ENCOUNTER — Encounter: Payer: TRICARE For Life (TFL) | Admitting: Physician Assistant

## 2014-08-28 NOTE — Progress Notes (Deleted)
Cardiology Office Note   Date:  08/28/2014   ID:  RAKEL Fleming, DOB 06-26-1929, MRN [id number]  PCP:  Penni Homans, MD  Cardiologist:  Dr. Jenkins Rouge     History of Present Illness: Monique Fleming is a 80 y.o. female with a hx of carotid stenosis (followed by [facility]), mild to mod AS, colon CA s/p R hemicolectomy, dementia, prior TIA, DM2, COPD.  Last seen by Dr. Jenkins Rouge in 12/2013.  Patient was just admitted 11/18-11/20 with acute on chronic diarrhea.  CDiff was negative.  She complained of chest pain and was seen by cardiology.  CEs were negative. She was to have an inpatient Myoview.  However, the patient insisted on going home.  Plan was to proceed with an outpatient stress test.    ***   Studies:  - Echo (4/15):  Mild LVH, vigorous LVF, EF 65-70%, normal wall motion, grade 1 diastolic dysfunction, mild to moderate aortic stenosis (mean gradient 17 mmHg), MAC  - Nuclear (7/12):  EF 74%, no ischemia  - Carotid US (5/15):  Bilateral ICA 50-69%   Recent Labs: 02/13/2014: LDL (calc) 54 03/14/2014: Pro B Natriuretic peptide (BNP) 604.7* 08/22/2014: TSH 2.420 08/23/2014: ALT 10 08/24/2014: BUN 9; Creatinine 0.71; Hemoglobin 10.2*; Potassium 4.0; Sodium 145    Recent Radiology: Dg Chest Port 1 View  08/23/2014    IMPRESSION: No active disease.   Electronically Signed   By: Lahoma Crocker M.D.   On: 08/23/2014 15:19     Wt Readings from Last 3 Encounters:  08/23/14 116 lb 6.4 oz (52.799 kg)  08/21/14 133 lb (60.328 kg)  08/16/14 134 lb (60.782 kg)     Past Medical History  Diagnosis Date  . Aortic stenosis     moderate by echo 6/11 gradient 20/54mmHg for mean and peak  . TIA (transient ischemic attack)     Dr. [name]  . Peripheral vascular disease   . IBS (irritable bowel syndrome)   . Diabetes mellitus     type II  . Chronic bronchitis   . COPD (chronic obstructive pulmonary disease)   . Atherosclerosis   . Depression   . Anxiety     Dr Casimiro Needle  . Renal cyst     . Anemia     iron deficiency  . Cellulitis   . Diverticular disease   . Infectious diarrhea(009.2)   . Sinusitis   . Thrush   . Abnormal liver function test   . GERD (gastroesophageal reflux disease)   . Insomnia     chronic  . Adenocarcinoma 2008    colon,s/p right hemicolectomy  . Angina   . Seizures     hx of one seizure "  . Cirrhosis of liver     hx of  . Clostridium difficile diarrhea   . Thrombocytopenia 04/01/2013  . Bursitis     spine per pt  . Macular degeneration 02/18/2014    Follow[facility] with [facility]  . Internal hemorrhoids   . Asthma     Current Outpatient Prescriptions  Medication Sig Dispense Refill  . albuterol (PROAIR HFA) 108 (90 BASE) MCG/ACT inhaler Inhale 2 puffs into the lungs every 4 (four) hours as needed for wheezing or shortness of breath.    Marland Kitchen albuterol (PROVENTIL) (2.5 MG/3ML) 0.083% nebulizer solution Take 2.5 mg by nebulization every 4 (four) hours as needed for wheezing or shortness of breath.    . cholestyramine (QUESTRAN) 4 G packet Take 4 g by mouth daily as needed (  diarrhea).     . clonazePAM (KLONOPIN) 1 MG tablet Take 1 mg by mouth 2 (two) times daily as needed for anxiety.     . clopidogrel (PLAVIX) 75 MG tablet TAKE ONE (1) TABLET EACH DAY 90 tablet 0  . cyanocobalamin 500 MCG tablet Take 500 mcg by mouth daily.    . ferrous sulfate 325 (65 FE) MG tablet Take 650 mg by mouth daily after breakfast.    . fluticasone (FLONASE) 50 MCG/ACT nasal spray Place 2 sprays into both nostrils daily. (Patient taking differently: Place 2 sprays into both nostrils daily as needed for allergies. ) 16 g 6  . Fluticasone-Salmeterol (ADVAIR) 250-50 MCG/DOSE AEPB Inhale 1 puff into the lungs 2 (two) times daily. 1 each 6  . Multiple Vitamin (MULTIVITAMIN WITH MINERALS) TABS tablet Take 1 tablet by mouth daily.    . Multiple Vitamins-Minerals (OCUVITE PRESERVISION) TABS Take by mouth 2 (two) times daily.    . ondansetron (ZOFRAN) 4 MG tablet Take 1 tablet (4 mg  total) by mouth every 6 (six) hours as needed for nausea or vomiting. 30 tablet 1  . phenytoin (DILANTIN) 100 MG ER capsule Take 100 mg by mouth. 2 capsules at bedtime    . Polyethyl Glycol-Propyl Glycol (SYSTANE) 0.4-0.3 % SOLN Place 1 drop into both eyes 2 (two) times daily as needed.    . potassium chloride (K-DUR) 10 MEQ tablet Take 10 mEq by mouth 2 (two) times daily.    Marland Kitchen saccharomyces boulardii (FLORASTOR) 250 MG capsule Take 250 mg by mouth daily.    . sodium chloride (OCEAN) 0.65 % SOLN nasal spray Place 2 sprays into both nostrils daily.    . traMADol (ULTRAM) 50 MG tablet TAKE ONE (1) TABLET EVERY 6 HOURS AS NEEDED FOR PAIN (MODERATE PAIN) 60 tablet 0   No current facility-administered medications for this visit.     Allergies:   Bactrim; Levofloxacin; Pneumovax; Amoxicillin-pot clavulanate; Penicillins; Bupropion; Haldol; Haloperidol; Morphine; Morphine and related; Pneumococcal vaccine; Pseudoephedrine hcl; and Sudafed   Social History:  The patient  reports that she quit smoking about 7 months ago. Her smoking use included Cigarettes. She started smoking about 63 years ago. She has a 35 pack-year smoking history. She has never used smokeless tobacco. She reports that she does not drink alcohol or use illicit drugs.   Family History:  The patient's family history includes Colon cancer in her paternal grandmother; Diabetes in her brother; Esophageal cancer in her brother; Heart disease in her mother; Hypertension in her father. There is no history of Rectal cancer or Stomach cancer.   ROS:  Please see the history of present illness.   ***   All other systems reviewed and negative.    PHYSICAL EXAM: VS:  There were no vitals taken for this visit. Well nourished, well developed, in no acute distress HEENT: normal Neck: *** JVD Cardiac:  normal S1, S2; ***RRR; *** murmur *** Lungs:  ***clear to auscultation bilaterally, no wheezing, rhonchi or rales Abd: soft, nontender, no  hepatomegaly Ext: *** edema Skin: warm and dry Neuro:  CNs 2-12 intact, no focal abnormalities noted  EKG:  ***      ASSESSMENT AND PLAN:  Chest pain, unspecified chest pain type  Aortic stenosis  Carotid disease, bilateral  Hyperlipidemia  History of malignant neoplasm of large intestine  Chronic obstructive pulmonary disease, unspecified COPD, unspecified chronic bronchitis type   Disposition:   FU with ***   Signed, Richardson Dopp, PA-C, MHS 08/28/2014 1:46 PM  [facility] [place], [place], [place]  [address] Phone: [phone]; Fax: [phone]

## 2014-08-28 NOTE — Progress Notes (Signed)
This encounter was created in error - please disregard.

## 2014-08-29 ENCOUNTER — Other Ambulatory Visit: Payer: Self-pay | Admitting: Family Medicine

## 2014-09-04 ENCOUNTER — Ambulatory Visit: Payer: Medicare Other | Admitting: Family Medicine

## 2014-09-10 ENCOUNTER — Telehealth: Payer: Self-pay | Admitting: *Deleted

## 2014-09-10 ENCOUNTER — Encounter: Payer: Self-pay | Admitting: Family Medicine

## 2014-09-10 NOTE — Telephone Encounter (Signed)
Received home health certification and plan of care via fax from [facility]. Forms forwarded to Dr. Charlett Blake. JG//CMA

## 2014-09-11 ENCOUNTER — Ambulatory Visit (INDEPENDENT_AMBULATORY_CARE_PROVIDER_SITE_OTHER): Payer: Medicare Other | Admitting: Family Medicine

## 2014-09-11 ENCOUNTER — Encounter: Payer: Self-pay | Admitting: Family Medicine

## 2014-09-11 VITALS — BP 140/68 | HR 84 | Temp 97.7°F | Ht 65.5 in | Wt 133.2 lb

## 2014-09-11 DIAGNOSIS — E118 Type 2 diabetes mellitus with unspecified complications: Secondary | ICD-10-CM | POA: Diagnosis not present

## 2014-09-11 DIAGNOSIS — K589 Irritable bowel syndrome without diarrhea: Secondary | ICD-10-CM

## 2014-09-11 DIAGNOSIS — D649 Anemia, unspecified: Secondary | ICD-10-CM

## 2014-09-11 DIAGNOSIS — M542 Cervicalgia: Secondary | ICD-10-CM

## 2014-09-11 DIAGNOSIS — J449 Chronic obstructive pulmonary disease, unspecified: Secondary | ICD-10-CM

## 2014-09-11 DIAGNOSIS — R260 Ataxic gait: Secondary | ICD-10-CM

## 2014-09-11 DIAGNOSIS — F331 Major depressive disorder, recurrent, moderate: Secondary | ICD-10-CM

## 2014-09-11 DIAGNOSIS — K219 Gastro-esophageal reflux disease without esophagitis: Secondary | ICD-10-CM

## 2014-09-11 DIAGNOSIS — E785 Hyperlipidemia, unspecified: Secondary | ICD-10-CM

## 2014-09-11 DIAGNOSIS — I6529 Occlusion and stenosis of unspecified carotid artery: Secondary | ICD-10-CM

## 2014-09-11 DIAGNOSIS — K580 Irritable bowel syndrome with diarrhea: Secondary | ICD-10-CM

## 2014-09-11 LAB — HEPATIC FUNCTION PANEL
ALBUMIN: 3.4 g/dL — AB (ref 3.5–5.2)
ALT: 17 U/L (ref 0–35)
AST: 42 U/L — ABNORMAL HIGH (ref 0–37)
Alkaline Phosphatase: 107 U/L (ref 39–117)
Bilirubin, Direct: 0.1 mg/dL (ref 0.0–0.3)
Total Bilirubin: 0.3 mg/dL (ref 0.2–1.2)
Total Protein: 7.4 g/dL (ref 6.0–8.3)

## 2014-09-11 LAB — IBC PANEL
IRON: 29 ug/dL — AB (ref 42–145)
SATURATION RATIOS: 8.5 % — AB (ref 20.0–50.0)
Transferrin: 245 mg/dL (ref 212.0–360.0)

## 2014-09-11 LAB — CBC
HEMATOCRIT: 35.6 % — AB (ref 36.0–46.0)
Hemoglobin: 11.5 g/dL — ABNORMAL LOW (ref 12.0–15.0)
MCHC: 32.2 g/dL (ref 30.0–36.0)
MCV: 84.3 fl (ref 78.0–100.0)
Platelets: 134 10*3/uL — ABNORMAL LOW (ref 150.0–400.0)
RBC: 4.22 Mil/uL (ref 3.87–5.11)
RDW: 15.6 % — ABNORMAL HIGH (ref 11.5–15.5)
WBC: 5.5 10*3/uL (ref 4.0–10.5)

## 2014-09-11 LAB — RENAL FUNCTION PANEL
Albumin: 3.4 g/dL — ABNORMAL LOW (ref 3.5–5.2)
BUN: 9 mg/dL (ref 6–23)
CHLORIDE: 105 meq/L (ref 96–112)
CO2: 27 mEq/L (ref 19–32)
CREATININE: 0.8 mg/dL (ref 0.4–1.2)
Calcium: 9 mg/dL (ref 8.4–10.5)
GFR: 76.08 mL/min (ref >60.00)
Glucose, Bld: 228 mg/dL — ABNORMAL HIGH (ref 70–99)
Phosphorus: 3 mg/dL (ref 2.3–4.6)
Potassium: 3.5 mEq/L (ref 3.5–5.1)
Sodium: 139 mEq/L (ref 135–145)

## 2014-09-11 LAB — TSH: TSH: 2.32 u[IU]/mL (ref 0.35–4.50)

## 2014-09-11 LAB — MAGNESIUM: Magnesium: 1.3 mg/dL — ABNORMAL LOW (ref 1.5–2.5)

## 2014-09-11 LAB — HEMOGLOBIN A1C: Hgb A1c MFr Bld: 7.8 % — ABNORMAL HIGH (ref 4.6–6.5)

## 2014-09-11 LAB — VITAMIN D 25 HYDROXY (VIT D DEFICIENCY, FRACTURES): VITD: 15.29 ng/mL — ABNORMAL LOW (ref 30.00–100.00)

## 2014-09-11 MED ORDER — TRAMADOL HCL 50 MG PO TABS: 50.0000 mg | ORAL_TABLET | Freq: Two times a day (BID) | ORAL | Status: DC | PRN

## 2014-09-11 MED ORDER — ALBUTEROL SULFATE HFA 108 (90 BASE) MCG/ACT IN AERS: 2.0000 | INHALATION_SPRAY | RESPIRATORY_TRACT | Status: DC | PRN

## 2014-09-11 NOTE — Patient Instructions (Signed)

## 2014-09-11 NOTE — Progress Notes (Signed)
Monique Fleming  [id number] Feb 09, 1929 09/11/2014      Progress Note-Follow Up  Subjective  Chief Complaint  Chief Complaint  Patient presents with  . Follow-up    hospital    HPI  Patient is a 80 y.o. female in today for routine medical care. Recently hospitalized with dehydration and diarrhea. Diarrhea has resolved but is now struggling with mild constipation. Is able to go every couple of days but has to strain. Sees occasional scant blood on tissue. Follows with GI , no significant pain. Continues to struggle with chronic neck, back and arthritic pain but manages with current meds. No flares. Depression, anxiety flared with hospitalization but are manageable with current meds. No febrile illness. Denies CP/palp/HA/fevers or GU c/o. Taking meds as prescribed  Past Medical History  Diagnosis Date  . Aortic stenosis     moderate by echo 6/11 gradient 20/2mmHg for mean and peak  . TIA (transient ischemic attack)     Dr. [name]  . Peripheral vascular disease   . IBS (irritable bowel syndrome)   . Diabetes mellitus     type II  . Chronic bronchitis   . COPD (chronic obstructive pulmonary disease)   . Atherosclerosis   . Depression   . Anxiety     Dr Casimiro Needle  . Renal cyst   . Anemia     iron deficiency  . Cellulitis   . Diverticular disease   . Infectious diarrhea(009.2)   . Sinusitis   . Thrush   . Abnormal liver function test   . GERD (gastroesophageal reflux disease)   . Insomnia     chronic  . Adenocarcinoma 2008    colon,s/p right hemicolectomy  . Angina   . Seizures     hx of one seizure "  . Cirrhosis of liver     hx of  . Clostridium difficile diarrhea   . Thrombocytopenia 04/01/2013  . Bursitis     spine per pt  . Macular degeneration 02/18/2014    Follows with [facility]  . Internal hemorrhoids   . Asthma     Past Surgical History  Procedure Laterality Date  . Carotid endarterectomy    . Colectomy      right hemi  . Cholecystectomy  11/09  .  Upper gastrointestinal endoscopy    . Tonsillectomy    . Appendectomy      Family History  Problem Relation Age of Onset  . Colon cancer Paternal Grandmother   . Esophageal cancer Brother   . Heart disease Mother   . Diabetes Brother   . Hypertension Father   . Rectal cancer Neg Hx   . Stomach cancer Neg Hx     History   Social History  . Marital Status: Widowed    Spouse Name: N/A    Number of Children: 3  . Years of Education: N/A   Occupational History  . retired   .     Social History Main Topics  . Smoking status: Former Smoker -- 0.50 packs/day for 70 years    Types: Cigarettes    Start date: 02/14/1951    Quit date: 01/03/2014  . Smokeless tobacco: Never Used     Comment: Still using  Electronic cigarette  . Alcohol Use: No  . Drug Use: No  . Sexual Activity: No   Other Topics Concern  . Not on file   Social History Narrative   Patient signed a Designated Party Release to allow her daughter, [name]  [place], to have access to her medical records/information. Entered by Fleet Contras March 24,2011 @ 2:47 pm   Dailly Caffeine Use    Current Outpatient Prescriptions on File Prior to Visit  Medication Sig Dispense Refill  . albuterol (PROAIR HFA) 108 (90 BASE) MCG/ACT inhaler Inhale 2 puffs into the lungs every 4 (four) hours as needed for wheezing or shortness of breath.    Marland Kitchen albuterol (PROVENTIL) (2.5 MG/3ML) 0.083% nebulizer solution Take 2.5 mg by nebulization every 4 (four) hours as needed for wheezing or shortness of breath.    . cholestyramine (QUESTRAN) 4 G packet Take 4 g by mouth daily as needed (diarrhea).     . clonazePAM (KLONOPIN) 1 MG tablet Take 1 mg by mouth 2 (two) times daily as needed for anxiety.     . clopidogrel (PLAVIX) 75 MG tablet TAKE ONE (1) TABLET EACH DAY 90 tablet 0  . ferrous sulfate 325 (65 FE) MG tablet Take 650 mg by mouth daily after breakfast.    . fluticasone (FLONASE) 50 MCG/ACT nasal spray Place 2 sprays into both nostrils  daily. (Patient taking differently: Place 2 sprays into both nostrils daily as needed for allergies. ) 16 g 6  . Fluticasone-Salmeterol (ADVAIR) 250-50 MCG/DOSE AEPB Inhale 1 puff into the lungs 2 (two) times daily. 1 each 6  . Multiple Vitamin (MULTIVITAMIN WITH MINERALS) TABS tablet Take 1 tablet by mouth daily.    . Multiple Vitamins-Minerals (OCUVITE PRESERVISION) TABS Take by mouth 2 (two) times daily.    . ondansetron (ZOFRAN) 4 MG tablet Take 1 tablet (4 mg total) by mouth every 6 (six) hours as needed for nausea or vomiting. 30 tablet 1  . phenytoin (DILANTIN) 100 MG ER capsule TAKE TWO CAPSULES BY MOUTH AT BEDTIME DAILY 60 capsule 0  . Polyethyl Glycol-Propyl Glycol (SYSTANE) 0.4-0.3 % SOLN Place 1 drop into both eyes 2 (two) times daily as needed.    . potassium chloride (K-DUR) 10 MEQ tablet Take 10 mEq by mouth 2 (two) times daily.    Marland Kitchen saccharomyces boulardii (FLORASTOR) 250 MG capsule Take 250 mg by mouth daily.    . sodium chloride (OCEAN) 0.65 % SOLN nasal spray Place 2 sprays into both nostrils daily.    . traMADol (ULTRAM) 50 MG tablet TAKE ONE (1) TABLET EVERY 6 HOURS AS NEEDED FOR PAIN (MODERATE PAIN) 60 tablet 0   No current facility-administered medications on file prior to visit.    Allergies  Allergen Reactions  . Bactrim [Sulfamethoxazole-Trimethoprim] Shortness Of Breath  . Levofloxacin Other (See Comments)    seizure  . Pneumovax [Pneumococcal Polysaccharide Vaccine] Other (See Comments)    Severe allergy-cellulitis  . Amoxicillin-Pot Clavulanate Other (See Comments)    Patient stated Drs. recommend taking Augmentin due to her PCN allergy.  . Penicillins Rash  . Bupropion   . Haldol [Haloperidol Lactate]     Had unknown reaction many yrs ago per pt  . Haloperidol   . Morphine   . Morphine And Related     Confused and agitation  . Pneumococcal Vaccine   . Pseudoephedrine Hcl   . Sudafed [Pseudoephedrine Hcl] Other (See Comments)    seizure    Review of  Systems  ROS  Objective  BP 152/114 mmHg  Pulse 84  Temp(Src) 97.7 F (36.5 C) (Oral)  Ht 5' 5.5" (1.664 m)  Wt 133 lb 3.2 oz (60.419 kg)  BMI 21.82 kg/m2  SpO2 98%  Physical Exam  Physical Exam  Lab Results  Component Value Date   TSH 2.420 08/22/2014   Lab Results  Component Value Date   WBC 4.7 08/24/2014   HGB 10.2* 08/24/2014   HCT 32.2* 08/24/2014   MCV 87.0 08/24/2014   PLT 86* 08/24/2014   Lab Results  Component Value Date   CREATININE 0.71 08/24/2014   BUN 9 08/24/2014   NA 145 08/24/2014   K 4.0 08/24/2014   CL 107 08/24/2014   CO2 25 08/24/2014   Lab Results  Component Value Date   ALT 10 08/23/2014   AST 30 08/23/2014   ALKPHOS 105 08/23/2014   BILITOT 0.4 08/23/2014   Lab Results  Component Value Date   CHOL 130 02/13/2014   Lab Results  Component Value Date   HDL 39* 02/13/2014   Lab Results  Component Value Date   LDLCALC 54 02/13/2014   Lab Results  Component Value Date   TRIG 185* 02/13/2014   Lab Results  Component Value Date   CHOLHDL 3.3 02/13/2014     Assessment & Plan  GERD Avoid offending foods, start probiotics. Do not eat large meals in late evening and consider raising head of bed.   COPD (chronic obstructive pulmonary disease) Gold B  Mild increase in sputum production but no increased SOB. Encouraged rest, fluids, meds as prescribed.  Irritable bowel syndrome Recently hospitalized with diarrhea and dehydration now with some mild constipation. Encouraged increased hydration and fiber in diet. Daily probiotics. If bowels not moving can use MOM 2 tbls po in 4 oz of warm prune juice by mouth every 2-3 days. If no results then repeat in 4 hours with  Dulcolax suppository pr, may repeat again in 4 more hours as needed. Seek care if symptoms worsen. Consider daily Miralax and/or Dulcolax if symptoms persist.   Depression, major, recurrent Stable on Duloxetine  NECK PAIN Struggles with chronic pain, has worked  with Dr Rip Harbour in past. May continue current meds for now. Encouraged to maintain activity as tolerated.

## 2014-09-11 NOTE — Progress Notes (Signed)
Pre visit review using our clinic review tool, if applicable. No additional management support is needed unless otherwise documented below in the visit note. 

## 2014-09-12 LAB — PARATHYROID HORMONE, INTACT (NO CA): PTH: 26 pg/mL (ref 14–64)

## 2014-09-12 NOTE — Telephone Encounter (Signed)
Forms faxed to [facility]. JG//CMA

## 2014-09-13 ENCOUNTER — Ambulatory Visit (INDEPENDENT_AMBULATORY_CARE_PROVIDER_SITE_OTHER): Payer: Medicare Other | Admitting: Adult Health

## 2014-09-13 ENCOUNTER — Encounter: Payer: Self-pay | Admitting: Adult Health

## 2014-09-13 VITALS — BP 118/82 | HR 101 | Temp 97.7°F | Ht 65.0 in | Wt 132.8 lb

## 2014-09-13 DIAGNOSIS — J449 Chronic obstructive pulmonary disease, unspecified: Secondary | ICD-10-CM

## 2014-09-13 DIAGNOSIS — I6529 Occlusion and stenosis of unspecified carotid artery: Secondary | ICD-10-CM

## 2014-09-13 MED ORDER — FERROUS FUMARATE 325 (106 FE) MG PO TABS: 1.0000 | ORAL_TABLET | Freq: Every day | ORAL | Status: DC

## 2014-09-13 MED ORDER — PREDNISONE 10 MG PO TABS: ORAL_TABLET | ORAL | Status: DC

## 2014-09-13 NOTE — Patient Instructions (Signed)
Prednisone taper over next week.  Continue on Advair 1 puff Twice daily  , rinse after use.  Work on not smoking  Follow up Dr. Joya Gaskins  In 6 -8 weeks in [place]  Please contact office for sooner follow up if symptoms do not improve or worsen or seek emergency care

## 2014-09-13 NOTE — Progress Notes (Signed)
   Subjective:    Patient ID: Monique Fleming, female    DOB: 07-12-1929, 80 y.o.   MRN: [id number]  HPI 63 yoF  smoker with COPD complicated by DM, ASCVD, hx colon cancer, anemia., GERD, esophageal varices  09/13/2014 Acute OV  Complains of increased SOB, wheezing, productive cough w/ small amount of white mucus x4days. No fever or discolored mucus.  Taking mucinex.  Advised on smoking cessation.  On Advair Twice daily   Last CXR 08/2014 w/ nad.  Denies any chest pain, orthopnea, PND or leg swelling.   Review of Systems Constitutional:   No  weight loss, night sweats,  Fevers, chills,  +fatigue, or  lassitude.  HEENT:   No headaches,  Difficulty swallowing,  Tooth/dental problems, or  Sore throat,       No sneezing, itching, ear ache, nasal congestion, post nasal drip,   CV:  No chest pain,  Orthopnea, PND, swelling in lower extremities, anasarca, dizziness, palpitations, syncope.   GI  No heartburn, indigestion, abdominal pain, nausea, vomiting, diarrhea, change in bowel habits, loss of appetite, bloody stools.   Resp:    No chest wall deformity  Skin: no rash or lesions.  GU: no dysuria, change in color of urine, no urgency or frequency.  No flank pain, no hematuria   MS:  No joint pain or swelling.  No decreased range of motion.  No back pain.  Psych:  No change in mood or affect. No depression or anxiety.  No memory loss.         Objective:   Physical Exam GEN: A/Ox3; pleasant , NAD, elderly   HEENT:  [place]/AT,  EACs-clear, TMs-wnl, NOSE-clear, THROAT-clear, no lesions, no postnasal drip or exudate noted.   NECK:  Supple w/ fair ROM; no JVD; normal carotid impulses w/o bruits; no thyromegaly or nodules palpated; no lymphadenopathy.  RESP decreased BS in bases , few exp wheezes  no accessory muscle use, no dullness to percussion  CARD:  RRR, no m/r/g  , no peripheral edema, pulses intact, no cyanosis or clubbing.  GI:   Soft & nt; nml bowel sounds; no  organomegaly or masses detected.  Musco: Warm bil, no deformities or joint swelling noted.   Neuro: alert, no focal deficits noted.    Skin: Warm, no lesions or rashes         Assessment & Plan:

## 2014-09-13 NOTE — Assessment & Plan Note (Signed)
Exacerbation   Plan  Prednisone taper over next week.  Continue on Advair 1 puff Twice daily  , rinse after use.  Work on not smoking  Follow up Dr. Joya Gaskins  In 6 -8 weeks in [facility]  Please contact office for sooner follow up if symptoms do not improve or worsen or seek emergency care

## 2014-09-17 ENCOUNTER — Telehealth: Payer: Self-pay | Admitting: Critical Care Medicine

## 2014-09-17 NOTE — Telephone Encounter (Signed)
Spoke with pt. She is scheduled to have ONO done tonight. FYI for [place]. Noting further needed

## 2014-09-21 ENCOUNTER — Telehealth: Payer: Self-pay | Admitting: Critical Care Medicine

## 2014-09-21 DIAGNOSIS — J449 Chronic obstructive pulmonary disease, unspecified: Secondary | ICD-10-CM

## 2014-09-21 NOTE — Telephone Encounter (Signed)
Tell pt her oxygen is very low at night She needs to start 2 Liters oxygen QHS only Use [facility]

## 2014-09-23 NOTE — Assessment & Plan Note (Signed)
Avoid offending foods, start probiotics. Do not eat large meals in late evening and consider raising head of bed.  

## 2014-09-23 NOTE — Assessment & Plan Note (Signed)
Recently hospitalized with diarrhea and dehydration now with some mild constipation. Encouraged increased hydration and fiber in diet. Daily probiotics. If bowels not moving can use MOM 2 tbls po in 4 oz of warm prune juice by mouth every 2-3 days. If no results then repeat in 4 hours with  Dulcolax suppository pr, may repeat again in 4 more hours as needed. Seek care if symptoms worsen. Consider daily Miralax and/or Dulcolax if symptoms persist.

## 2014-09-23 NOTE — Assessment & Plan Note (Signed)
Struggles with chronic pain, has worked with Dr Rip Harbour in past. May continue current meds for now. Encouraged to maintain activity as tolerated.

## 2014-09-23 NOTE — Assessment & Plan Note (Signed)
Mild increase in sputum production but no increased SOB. Encouraged rest, fluids, meds as prescribed.

## 2014-09-23 NOTE — Assessment & Plan Note (Signed)
Stable on Duloxetine 

## 2014-09-24 NOTE — Telephone Encounter (Signed)
Called, spoke with pt.  Discussed results and recs per Dr. Joya Gaskins.  Pt verbalized understanding of both.  She is aware [facility] will be contacting her to schedule o2 set up.  She is to call office back if she does not receive call from [facility] regarding this in the next 1-2 days.  Pt verbalized understanding, is in agreement with plan, and voiced no further questions or concerns at this time.

## 2014-09-25 ENCOUNTER — Other Ambulatory Visit: Payer: Self-pay | Admitting: *Deleted

## 2014-09-25 MED ORDER — PANTOPRAZOLE SODIUM 40 MG PO TBEC: 40.0000 mg | DELAYED_RELEASE_TABLET | Freq: Every day | ORAL | Status: DC

## 2014-10-01 ENCOUNTER — Telehealth: Payer: Self-pay

## 2014-10-01 NOTE — Telephone Encounter (Signed)
Notification faxed regarding missed visit on 09/27/14.  "Patient requested not to be seen for pt tx due to Christmas Holiday."   Notification placed in Dr. Frederik Pear red folder for review and signature.

## 2014-10-02 ENCOUNTER — Telehealth: Payer: Self-pay | Admitting: Family Medicine

## 2014-10-03 NOTE — Telephone Encounter (Signed)
Discussed situation with Dr. Birdie Riddle.  Verbal order given for 60 capsules, 0 refills.  Rx sent to [facility].  Pt made aware.  No further needs voiced.

## 2014-10-03 NOTE — Telephone Encounter (Signed)
Caller name:Jenine Relation to pt:pt experience Call back number: Pharmacy:  Reason for call: Jenine from pt experience called stating that the pt is very upset because she is completely out of meds. Jenine wanted to know if we could get pt enough pills to last until Tuesday.

## 2014-10-03 NOTE — Telephone Encounter (Signed)
Patient calling on this. Stating that she is out. Would like a refill.

## 2014-10-03 NOTE — Telephone Encounter (Signed)
Please advise 

## 2014-10-08 ENCOUNTER — Ambulatory Visit (INDEPENDENT_AMBULATORY_CARE_PROVIDER_SITE_OTHER): Payer: Medicare Other | Admitting: Internal Medicine

## 2014-10-08 ENCOUNTER — Encounter: Payer: Self-pay | Admitting: Internal Medicine

## 2014-10-08 ENCOUNTER — Other Ambulatory Visit (INDEPENDENT_AMBULATORY_CARE_PROVIDER_SITE_OTHER): Payer: Medicare Other

## 2014-10-08 VITALS — BP 144/50 | HR 88 | Ht 65.0 in | Wt 131.8 lb

## 2014-10-08 DIAGNOSIS — K589 Irritable bowel syndrome without diarrhea: Secondary | ICD-10-CM

## 2014-10-08 DIAGNOSIS — K625 Hemorrhage of anus and rectum: Secondary | ICD-10-CM

## 2014-10-08 DIAGNOSIS — R159 Full incontinence of feces: Secondary | ICD-10-CM

## 2014-10-08 DIAGNOSIS — D500 Iron deficiency anemia secondary to blood loss (chronic): Secondary | ICD-10-CM

## 2014-10-08 DIAGNOSIS — K648 Other hemorrhoids: Secondary | ICD-10-CM

## 2014-10-08 LAB — CBC WITH DIFFERENTIAL/PLATELET
BASOS ABS: 0 10*3/uL (ref 0.0–0.1)
Basophils Relative: 0.5 % (ref 0.0–3.0)
Eosinophils Absolute: 0.1 10*3/uL (ref 0.0–0.7)
Eosinophils Relative: 2.9 % (ref 0.0–5.0)
HEMATOCRIT: 33.6 % — AB (ref 36.0–46.0)
Hemoglobin: 10.8 g/dL — ABNORMAL LOW (ref 12.0–15.0)
Lymphocytes Relative: 25.1 % (ref 12.0–46.0)
Lymphs Abs: 1.2 10*3/uL (ref 0.7–4.0)
MCHC: 32.1 g/dL (ref 30.0–36.0)
MCV: 83.2 fl (ref 78.0–100.0)
MONO ABS: 0.5 10*3/uL (ref 0.1–1.0)
Monocytes Relative: 9.5 % (ref 3.0–12.0)
Neutro Abs: 3 10*3/uL (ref 1.4–7.7)
Neutrophils Relative %: 62 % (ref 43.0–77.0)
PLATELETS: 151 10*3/uL (ref 150.0–400.0)
RBC: 4.04 Mil/uL (ref 3.87–5.11)
RDW: 16.8 % — AB (ref 11.5–15.5)
WBC: 4.9 10*3/uL (ref 4.0–10.5)

## 2014-10-08 NOTE — Patient Instructions (Signed)
Your physician has requested that you go to the basement for the following lab work before leaving today:  CBC  Continue your current medications

## 2014-10-08 NOTE — Progress Notes (Signed)
HISTORY OF PRESENT ILLNESS:  Monique Fleming is a 81 y.o. female with MULTIPLE SIGNIFICANT medical problems including diabetes mellitus, COPD, cerebrovascular disease with prior stroke, aortic stenosis, hepatic cirrhosis, colon cancer status post right hemicolectomy, chronic noninfectious diarrhea, IBS, anxiety, depression, colonic AVMs, chronic anemia, prior cholecystectomy, prior endarterectomy, on chronic Plavix therapy. Patient presents today for follow-up with ALT will GI complaints. She continues to complain of lower abdominal cramping followed by urgency and loose stools. Next, intermittent minor rectal bleeding without rectal pain. She is known to have hemorrhoids and rectal varices. Next, she reports intermittent fecal incontinence for which she uses protective undergarments. Her last colonoscopy was performed January 2014. Last upper endoscopy February 2013. She was hospitalized November 2015 with worsening diarrhea. Seen by the GI inpatient hospital team. No particular diagnosis given, though possibly worsening diarrhea due to antibiotic exposure without Clostridium difficile. Her last hemoglobin in early December was 11.5. Baseline is around 10-11 range.  REVIEW OF SYSTEMS:  All non-GI ROS negative except for hoarseness, cough, anxiety  Past Medical History  Diagnosis Date  . Aortic stenosis     moderate by echo 6/11 gradient 20/36mmHg for mean and peak  . TIA (transient ischemic attack)     Dr. [name]  . Peripheral vascular disease   . IBS (irritable bowel syndrome)   . Diabetes mellitus     type II  . Chronic bronchitis   . COPD (chronic obstructive pulmonary disease)   . Atherosclerosis   . Depression   . Anxiety     Dr Casimiro Needle  . Renal cyst   . Anemia     iron deficiency  . Cellulitis   . Diverticular disease   . Infectious diarrhea(009.2)   . Sinusitis   . Thrush   . Abnormal liver function test   . GERD (gastroesophageal reflux disease)   . Insomnia     chronic  .  Adenocarcinoma 2008    colon,s/p right hemicolectomy  . Angina   . Seizures     hx of one seizure "  . Cirrhosis of liver     hx of  . Clostridium difficile diarrhea   . Thrombocytopenia 04/01/2013  . Bursitis     spine per pt  . Macular degeneration 02/18/2014    Follows with [facility]  . Internal hemorrhoids   . Asthma     Past Surgical History  Procedure Laterality Date  . Carotid endarterectomy    . Colectomy      right hemi  . Cholecystectomy  11/09  . Upper gastrointestinal endoscopy    . Tonsillectomy    . Appendectomy      Social History Monique Fleming  reports that she quit smoking about 9 months ago. Her smoking use included Cigarettes. She started smoking about 63 years ago. She has a 35 pack-year smoking history. She has never used smokeless tobacco. She reports that she does not drink alcohol or use illicit drugs.  family history includes Colon cancer in her paternal grandmother; Diabetes in her brother; Esophageal cancer in her brother; Heart disease in her mother; Hypertension in her father. There is no history of Rectal cancer or Stomach cancer.  Allergies  Allergen Reactions  . Bactrim [Sulfamethoxazole-Trimethoprim] Shortness Of Breath  . Levofloxacin Other (See Comments)    seizure  . Pneumovax [Pneumococcal Polysaccharide Vaccine] Other (See Comments)    Severe allergy-cellulitis  . Amoxicillin-Pot Clavulanate Other (See Comments)    Patient stated Drs. recommend taking Augmentin due to her  PCN allergy.  . Penicillins Rash  . Bupropion   . Haldol [Haloperidol Lactate]     Had unknown reaction many yrs ago per pt  . Haloperidol   . Morphine   . Morphine And Related     Confused and agitation  . Pneumococcal Vaccine   . Pseudoephedrine Hcl   . Sudafed [Pseudoephedrine Hcl] Other (See Comments)    seizure       PHYSICAL EXAMINATION: Vital signs: BP 144/50 mmHg  Pulse 88  Ht 5\' 5"  (1.651 m)  Wt 131 lb 12.8 oz (59.784 kg)  BMI 21.93  kg/m2 General: Chronically ill-appearing elderly female, well-nourished, no acute distress HEENT: Sclerae are anicteric, conjunctiva pink. Oral mucosa intact Lungs: Clear with rare end expiratory wheeze Heart: Regular with previous surgical incisions well-healed systolic murmur Abdomen: soft, nontender, nondistended, no obvious ascites, no peritoneal signs, normal bowel sounds. No organomegaly. Extremities: Trace edema bilaterally Psychiatric: alert and oriented x3. Cooperative. Neuro: No asterixis   ASSESSMENT:  #1. Chronic diarrhea. IBS #2. Bleeding hemorrhoids #3. Fecal incontinence #4. Multiple medical problems   PLAN:  #1. Questran for chronic diarrhea #2. Anusol suppositories for bleeding hemorrhoids #3. Recheck CBC today # 4. Protective undergarments for episodes of incontinence # 5. Resume care with PCP and multiple other specialists regarding chronic medical problems # 6. Routine GI follow-up in 6 months. Sooner if needed

## 2014-10-18 ENCOUNTER — Encounter: Payer: Self-pay | Admitting: Family Medicine

## 2014-10-23 ENCOUNTER — Telehealth: Payer: Self-pay | Admitting: Cardiovascular Disease

## 2014-10-23 DIAGNOSIS — I350 Nonrheumatic aortic (valve) stenosis: Secondary | ICD-10-CM

## 2014-10-23 NOTE — Telephone Encounter (Signed)
F/u April is fine we ordered no recent tests on her Should have echo for AS same day she sees me in April

## 2014-10-23 NOTE — Telephone Encounter (Signed)
Returned patient's call. She had received a letter for follow up appointment. Patient was talking about a procedure that was discussed with her in the hospital back in November. Patient denies having test done and there is no documentation of a stress test. She might have been referring to a lexiscan, but she could not remember what procedure she was suppose to have. She is suppose to have a repeat echo in a year and an office visit around the same time. I have scheduled her an appointment with Dr. Johnsie Cancel on 01/07/15 at 2:00 pm for her yearly office visit and then she can discuss any other test she may need. Will forward to Dr. Johnsie Cancel for any further instructions.

## 2014-10-23 NOTE — Telephone Encounter (Signed)
New problem   Pt is wanting to talk to a nurse concerning a procedure she is suppose to be having. Pt is confused about what procedure it is and stated Dr Johnsie Cancel spoke to her about it at her last visit. Please advise pt.

## 2014-10-25 ENCOUNTER — Ambulatory Visit (INDEPENDENT_AMBULATORY_CARE_PROVIDER_SITE_OTHER): Payer: Medicare Other | Admitting: Critical Care Medicine

## 2014-10-25 ENCOUNTER — Encounter: Payer: Self-pay | Admitting: Critical Care Medicine

## 2014-10-25 VITALS — BP 158/62 | HR 89 | Temp 98.5°F | Ht 65.5 in | Wt 131.0 lb

## 2014-10-25 DIAGNOSIS — J449 Chronic obstructive pulmonary disease, unspecified: Secondary | ICD-10-CM

## 2014-10-25 MED ORDER — ALBUTEROL SULFATE HFA 108 (90 BASE) MCG/ACT IN AERS: 2.0000 | INHALATION_SPRAY | RESPIRATORY_TRACT | Status: DC | PRN

## 2014-10-25 NOTE — Telephone Encounter (Signed)
PT  AWARE./CY 

## 2014-10-25 NOTE — Patient Instructions (Signed)
Refill on rescue inhaler sent to [facility] No change in medications. Return in          3 months

## 2014-10-25 NOTE — Progress Notes (Signed)
Subjective:    Patient ID: Monique Fleming, female    DOB: 10/03/1929, 81 y.o.   MRN: [id number]  HPI 29 yoF  smoker with COPD complicated by DM, ASCVD, hx colon cancer, anemia., GERD, esophageal varices   Acute OV  Complains of increased SOB, wheezing, productive cough w/ small amount of white mucus x4days. No fever or discolored mucus.  Taking mucinex.  Advised on smoking cessation.  On Advair Twice daily   Last CXR 08/2014 w/ nad.  Denies any chest pain, orthopnea, PND or leg swelling.  10/25/2014 Chief Complaint  Patient presents with  . Follow-up    breathing bad with the weather; using oxygen at night, puts oxygen on at night before bed, but it ends up in the floor before she wakes up in the morning. Wants an extra inhaler to keep in her purse  Notes more dyspnea in current weather.  On oxygen at night, occ comes off at night. Pt denies any significant sore throat, nasal congestion or excess secretions, fever, chills, sweats, unintended weight loss, pleurtic or exertional chest pain, orthopnea PND, or leg swelling Pt denies any increase in rescue therapy over baseline, denies waking up needing it or having any early am or nocturnal exacerbations of coughing/wheezing/or dyspnea. Pt also denies any obvious fluctuation in symptoms with  weather or environmental change or other alleviating or aggravating factors     Review of Systems Constitutional:   No  weight loss, night sweats,  Fevers, chills,  +fatigue, or  lassitude.  HEENT:   No headaches,  Difficulty swallowing,  Tooth/dental problems, or  Sore throat,       No sneezing, itching, ear ache, nasal congestion, post nasal drip,   CV:  No chest pain,  Orthopnea, PND, swelling in lower extremities, anasarca, dizziness, palpitations, syncope.   GI  No heartburn, indigestion, abdominal pain, nausea, vomiting, diarrhea, change in bowel habits, loss of appetite, bloody stools.   Resp:    No chest wall deformity  Skin: no  rash or lesions.  GU: no dysuria, change in color of urine, no urgency or frequency.  No flank pain, no hematuria   MS:  No joint pain or swelling.  No decreased range of motion.  No back pain.  Psych:  No change in mood or affect. No depression or anxiety.  No memory loss.         Objective:   Physical Exam  BP 158/62 mmHg  Pulse 89  Temp(Src) 98.5 F (36.9 C) (Oral)  Ht 5' 5.5" (1.664 m)  Wt 131 lb (59.421 kg)  BMI 21.46 kg/m2  SpO2 96%  GEN: A/Ox3; pleasant , NAD, elderly   HEENT:  [place]/AT,  EACs-clear, TMs-wnl, NOSE-clear, THROAT-clear, no lesions, no postnasal drip or exudate noted.   NECK:  Supple w/ fair ROM; no JVD; normal carotid impulses w/o bruits; no thyromegaly or nodules palpated; no lymphadenopathy.  RESP decreased BS in bases , few exp wheezes  no accessory muscle use, no dullness to percussion  CARD:  RRR, no m/r/g  , no peripheral edema, pulses intact, no cyanosis or clubbing.  GI:   Soft & nt; nml bowel sounds; no organomegaly or masses detected.  Musco: Warm bil, no deformities or joint swelling noted.   Neuro: alert, no focal deficits noted.    Skin: Warm, no lesions or rashes      Assessment & Plan:   COPD (chronic obstructive pulmonary disease) Gold B  Stable copd gold B Plan Cont inhaled  meds Refill saba    Updated Medication List Outpatient Encounter Prescriptions as of 10/25/2014  Medication Sig  . albuterol (PROAIR HFA) 108 (90 BASE) MCG/ACT inhaler Inhale 2 puffs into the lungs every 4 (four) hours as needed for wheezing or shortness of breath.  Marland Kitchen albuterol (PROVENTIL) (2.5 MG/3ML) 0.083% nebulizer solution Take 2.5 mg by nebulization every 4 (four) hours as needed for wheezing or shortness of breath.  . Calcium Citrate (CITRACAL PO) Take by mouth.  . cholestyramine (QUESTRAN) 4 G packet Take 4 g by mouth daily as needed (diarrhea).   . clonazePAM (KLONOPIN) 1 MG tablet Take 1 mg by mouth 2 (two) times daily as needed for anxiety.    . clopidogrel (PLAVIX) 75 MG tablet TAKE ONE (1) TABLET EACH DAY  . ferrous fumarate (HEMOCYTE - 106 MG FE) 325 (106 FE) MG TABS tablet Take 1 tablet (106 mg of iron total) by mouth daily.  . fluticasone (FLONASE) 50 MCG/ACT nasal spray Place 2 sprays into both nostrils daily. (Patient taking differently: Place 2 sprays into both nostrils daily as needed for allergies. )  . Fluticasone-Salmeterol (ADVAIR) 250-50 MCG/DOSE AEPB Inhale 1 puff into the lungs 2 (two) times daily.  . Multiple Vitamin (MULTIVITAMIN WITH MINERALS) TABS tablet Take 1 tablet by mouth daily.  . Multiple Vitamins-Minerals (OCUVITE PRESERVISION) TABS Take by mouth 2 (two) times daily.  . ondansetron (ZOFRAN) 4 MG tablet Take 1 tablet (4 mg total) by mouth every 6 (six) hours as needed for nausea or vomiting.  . pantoprazole (PROTONIX) 40 MG tablet Take 1 tablet (40 mg total) by mouth daily.  . phenytoin (DILANTIN) 100 MG ER capsule TAKE TWO CAPSULES BY MOUTH AT BEDTIME DAILY  . Polyethyl Glycol-Propyl Glycol (SYSTANE) 0.4-0.3 % SOLN Place 1 drop into both eyes 2 (two) times daily as needed.  . potassium chloride (K-DUR) 10 MEQ tablet Take 10 mEq by mouth 2 (two) times daily.  Marland Kitchen saccharomyces boulardii (FLORASTOR) 250 MG capsule Take 250 mg by mouth daily.  . sodium chloride (OCEAN) 0.65 % SOLN nasal spray Place 2 sprays into both nostrils daily.  . traMADol (ULTRAM) 50 MG tablet Take 1 tablet (50 mg total) by mouth every 12 (twelve) hours as needed for moderate pain or severe pain.  . [DISCONTINUED] albuterol (PROAIR HFA) 108 (90 BASE) MCG/ACT inhaler Inhale 2 puffs into the lungs every 4 (four) hours as needed for wheezing or shortness of breath.

## 2014-10-25 NOTE — Addendum Note (Signed)
Addended by: Devra Dopp E on: 10/25/2014 10:57 AM   Modules accepted: Orders

## 2014-10-26 NOTE — Assessment & Plan Note (Signed)
Stable copd gold B Plan Cont inhaled meds Refill saba

## 2014-11-01 ENCOUNTER — Other Ambulatory Visit: Payer: Self-pay | Admitting: Family Medicine

## 2014-11-06 ENCOUNTER — Ambulatory Visit: Payer: Medicare Other | Admitting: Family Medicine

## 2014-11-12 ENCOUNTER — Other Ambulatory Visit: Payer: Self-pay | Admitting: Family Medicine

## 2014-11-12 MED ORDER — CHOLESTYRAMINE 4 G PO PACK: 4.0000 g | PACK | Freq: Every day | ORAL | Status: DC | PRN

## 2014-11-12 MED ORDER — CLOPIDOGREL BISULFATE 75 MG PO TABS: ORAL_TABLET | ORAL | Status: DC

## 2014-11-12 MED ORDER — FLUTICASONE PROPIONATE 50 MCG/ACT NA SUSP: 2.0000 | Freq: Every day | NASAL | Status: DC | PRN

## 2014-11-12 MED ORDER — POTASSIUM CHLORIDE ER 10 MEQ PO TBCR: 10.0000 meq | EXTENDED_RELEASE_TABLET | Freq: Two times a day (BID) | ORAL | Status: DC

## 2014-11-12 MED ORDER — ALBUTEROL SULFATE HFA 108 (90 BASE) MCG/ACT IN AERS: 2.0000 | INHALATION_SPRAY | RESPIRATORY_TRACT | Status: AC | PRN

## 2014-11-15 ENCOUNTER — Telehealth: Payer: Self-pay | Admitting: Family Medicine

## 2014-11-15 MED ORDER — POTASSIUM CHLORIDE ER 10 MEQ PO TBCR: 10.0000 meq | EXTENDED_RELEASE_TABLET | Freq: Two times a day (BID) | ORAL | Status: DC

## 2014-11-15 MED ORDER — CHOLESTYRAMINE 4 G PO PACK: 4.0000 g | PACK | Freq: Every day | ORAL | Status: DC | PRN

## 2014-11-15 NOTE — Telephone Encounter (Signed)
Caller name:[facility]  Can be reached:[phone]   Reason for call: [facility] requesting 90 day script for Potassium chloride and Questran. Scripts were written on 11/12/14.

## 2014-11-15 NOTE — Telephone Encounter (Signed)
Resent prescriptions as requested to [facility].

## 2014-11-19 ENCOUNTER — Other Ambulatory Visit: Payer: Self-pay | Admitting: Family Medicine

## 2014-11-19 MED ORDER — POTASSIUM CHLORIDE ER 10 MEQ PO TBCR: 10.0000 meq | EXTENDED_RELEASE_TABLET | Freq: Two times a day (BID) | ORAL | Status: DC

## 2014-11-19 MED ORDER — CHOLESTYRAMINE 4 G PO PACK: 4.0000 g | PACK | Freq: Every day | ORAL | Status: DC | PRN

## 2014-11-19 NOTE — Telephone Encounter (Signed)
[facility]  

## 2014-11-26 ENCOUNTER — Encounter: Payer: Self-pay | Admitting: Internal Medicine

## 2014-11-27 ENCOUNTER — Telehealth: Payer: Self-pay | Admitting: *Deleted

## 2014-11-27 NOTE — Telephone Encounter (Signed)
Patient called saying she is feeling very weak and wants her labs drawn. She says it feels like her 'iron is low'. Offered to bring patient in this Thursday or Friday for labs. Patient says this wait is too long. She prefers to go to the emergency room to have her blood checked. I explained to her that the emergency room may not draw iron studies as these aren't considered emergent, but she wants to go to the ED. Told her to call us if it was determined that she needed iron.

## 2014-11-28 ENCOUNTER — Other Ambulatory Visit: Payer: Self-pay | Admitting: *Deleted

## 2014-11-28 DIAGNOSIS — D509 Iron deficiency anemia, unspecified: Secondary | ICD-10-CM

## 2014-11-28 DIAGNOSIS — D485 Neoplasm of uncertain behavior of skin: Secondary | ICD-10-CM

## 2014-11-29 ENCOUNTER — Other Ambulatory Visit (HOSPITAL_BASED_OUTPATIENT_CLINIC_OR_DEPARTMENT_OTHER): Payer: Medicare Other | Admitting: Lab

## 2014-11-29 DIAGNOSIS — D509 Iron deficiency anemia, unspecified: Secondary | ICD-10-CM

## 2014-11-29 DIAGNOSIS — D485 Neoplasm of uncertain behavior of skin: Secondary | ICD-10-CM

## 2014-11-29 LAB — CBC WITH DIFFERENTIAL (CANCER CENTER ONLY)
BASO#: 0 10*3/uL (ref 0.0–0.2)
BASO%: 0.3 % (ref 0.0–2.0)
EOS%: 2.6 % (ref 0.0–7.0)
Eosinophils Absolute: 0.2 10*3/uL (ref 0.0–0.5)
HEMATOCRIT: 34 % — AB (ref 34.8–46.6)
HGB: 10.7 g/dL — ABNORMAL LOW (ref 11.6–15.9)
LYMPH#: 1.2 10*3/uL (ref 0.9–3.3)
LYMPH%: 20.1 % (ref 14.0–48.0)
MCH: 26.4 pg (ref 26.0–34.0)
MCHC: 31.5 g/dL — AB (ref 32.0–36.0)
MCV: 84 fL (ref 81–101)
MONO#: 0.6 10*3/uL (ref 0.1–0.9)
MONO%: 9.9 % (ref 0.0–13.0)
NEUT#: 3.9 10*3/uL (ref 1.5–6.5)
NEUT%: 67.1 % (ref 39.6–80.0)
Platelets: 116 10*3/uL — ABNORMAL LOW (ref 145–400)
RBC: 4.05 10*6/uL (ref 3.70–5.32)
RDW: 15.9 % — AB (ref 11.1–15.7)
WBC: 5.8 10*3/uL (ref 3.9–10.0)

## 2014-11-29 LAB — COMPREHENSIVE METABOLIC PANEL
ALT: 10 U/L (ref 0–35)
AST: 28 U/L (ref 0–37)
Albumin: 3.5 g/dL (ref 3.5–5.2)
Alkaline Phosphatase: 99 U/L (ref 39–117)
BUN: 8 mg/dL (ref 6–23)
CO2: 26 mEq/L (ref 19–32)
CREATININE: 0.77 mg/dL (ref 0.50–1.10)
Calcium: 8.6 mg/dL (ref 8.4–10.5)
Chloride: 105 mEq/L (ref 96–112)
Glucose, Bld: 154 mg/dL — ABNORMAL HIGH (ref 70–99)
POTASSIUM: 3.5 meq/L (ref 3.5–5.3)
Sodium: 141 mEq/L (ref 135–145)
Total Bilirubin: 0.4 mg/dL (ref 0.2–1.2)
Total Protein: 6.5 g/dL (ref 6.0–8.3)

## 2014-11-29 LAB — FERRITIN CHCC: Ferritin: 30 ng/ml (ref 9–269)

## 2014-11-29 LAB — IRON AND TIBC CHCC
%SAT: 57 % (ref 21–57)
IRON: 171 ug/dL — AB (ref 41–142)
TIBC: 303 ug/dL (ref 236–444)
UIBC: 132 ug/dL (ref 120–384)

## 2014-11-29 LAB — LACTATE DEHYDROGENASE: LDH: 170 U/L (ref 94–250)

## 2014-12-07 ENCOUNTER — Ambulatory Visit (INDEPENDENT_AMBULATORY_CARE_PROVIDER_SITE_OTHER): Payer: Medicare Other | Admitting: Family Medicine

## 2014-12-07 ENCOUNTER — Encounter: Payer: Self-pay | Admitting: Family Medicine

## 2014-12-07 VITALS — HR 93 | Temp 98.4°F | Ht 65.0 in | Wt 133.5 lb

## 2014-12-07 DIAGNOSIS — K219 Gastro-esophageal reflux disease without esophagitis: Secondary | ICD-10-CM | POA: Diagnosis not present

## 2014-12-07 DIAGNOSIS — D518 Other vitamin B12 deficiency anemias: Secondary | ICD-10-CM | POA: Diagnosis not present

## 2014-12-07 DIAGNOSIS — E785 Hyperlipidemia, unspecified: Secondary | ICD-10-CM

## 2014-12-07 DIAGNOSIS — N390 Urinary tract infection, site not specified: Secondary | ICD-10-CM

## 2014-12-07 LAB — POCT URINALYSIS DIPSTICK
BILIRUBIN UA: NEGATIVE
Glucose, UA: NEGATIVE
Ketones, UA: NEGATIVE
Leukocytes, UA: NEGATIVE
Nitrite, UA: NEGATIVE
PH UA: 6.5
Protein, UA: 0.15
RBC UA: NEGATIVE
SPEC GRAV UA: 1.015
Urobilinogen, UA: 4

## 2014-12-07 MED ORDER — NITROFURANTOIN MONOHYD MACRO 100 MG PO CAPS: 100.0000 mg | ORAL_CAPSULE | Freq: Two times a day (BID) | ORAL | Status: AC

## 2014-12-07 MED ORDER — TRAMADOL HCL 50 MG PO TABS: 50.0000 mg | ORAL_TABLET | Freq: Two times a day (BID) | ORAL | Status: DC | PRN

## 2014-12-07 MED ORDER — METFORMIN HCL 500 MG PO TABS: 500.0000 mg | ORAL_TABLET | Freq: Every day | ORAL | Status: DC

## 2014-12-07 MED ORDER — METHYLPREDNISOLONE (PAK) 4 MG PO TABS: ORAL_TABLET | ORAL | Status: DC

## 2014-12-07 NOTE — Patient Instructions (Signed)

## 2014-12-07 NOTE — Progress Notes (Signed)
Pre visit review using our clinic review tool, if applicable. No additional management support is needed unless otherwise documented below in the visit note. 

## 2014-12-16 ENCOUNTER — Encounter: Payer: Self-pay | Admitting: Family Medicine

## 2014-12-16 NOTE — Assessment & Plan Note (Signed)
stable °

## 2014-12-16 NOTE — Assessment & Plan Note (Signed)
Avoid offending foods, start probiotics. Do not eat large meals in late evening and consider raising head of bed.  

## 2014-12-16 NOTE — Assessment & Plan Note (Signed)
hgba1c elevated, minimize simple carbs. Increase exercise as tolerated. Continue current meds recheck A1C with next visit

## 2014-12-16 NOTE — Progress Notes (Signed)
Monique Fleming  [id number] 1929-02-04 12/16/2014      Progress Note-Follow Up  Subjective  Chief Complaint  Chief Complaint  Patient presents with  . Follow-up    HPI  Patient is a 81 y.o. female in today for routine medical care. Patient is in today for follow-up. Has not been feeling well for the past 1-2 weeks. Has had some intermittent trouble with nausea but no vomiting. Notes some low-grade fevers and chills as well as mild congestion. No significant chest pain or shortness of breath. Is requesting a refill on her tramadol. Has been struggling with worsening vision and is following closely with ophthalmology. Sees Dr. Windy Fleming.   Past Medical History  Diagnosis Date  . Aortic stenosis     moderate by echo 6/11 gradient 20/28mmHg for mean and peak  . TIA (transient ischemic attack)     Dr. Leonie Fleming  . Peripheral vascular disease   . IBS (irritable bowel syndrome)   . Diabetes mellitus     type II  . Chronic bronchitis   . COPD (chronic obstructive pulmonary disease)   . Atherosclerosis   . Depression   . Anxiety     Dr Monique Fleming  . Renal cyst   . Anemia     iron deficiency  . Cellulitis   . Diverticular disease   . Infectious diarrhea(009.2)   . Sinusitis   . Thrush   . Abnormal liver function test   . GERD (gastroesophageal reflux disease)   . Insomnia     chronic  . Adenocarcinoma 2008    colon,s/p right hemicolectomy  . Angina   . Seizures     hx of one seizure "  . Cirrhosis of liver     hx of  . Clostridium difficile diarrhea   . Thrombocytopenia 04/01/2013  . Bursitis     spine per pt  . Macular degeneration 02/18/2014    Follows with [facility]  . Internal hemorrhoids   . Asthma     Past Surgical History  Procedure Laterality Date  . Carotid endarterectomy    . Colectomy      right hemi  . Cholecystectomy  11/09  . Upper gastrointestinal endoscopy    . Tonsillectomy    . Appendectomy      Family History  Problem Relation Age of  Onset  . Colon cancer Paternal Grandmother   . Esophageal cancer Brother   . Heart disease Mother   . Diabetes Brother   . Hypertension Father   . Rectal cancer Neg Hx   . Stomach cancer Neg Hx     History   Social History  . Marital Status: Widowed    Spouse Name: N/A  . Number of Children: 3  . Years of Education: N/A   Occupational History  . retired   .     Social History Main Topics  . Smoking status: Former Smoker -- 0.50 packs/day for 70 years    Types: Cigarettes    Start date: 02/14/1951    Quit date: 01/03/2014  . Smokeless tobacco: Never Used     Comment: Still using  Electronic cigarette  . Alcohol Use: No  . Drug Use: No  . Sexual Activity: No   Other Topics Concern  . Not on file   Social History Narrative   Patient signed a Designated Party Release to allow her daughter, Monique Fleming, to have access to her medical records/information. Entered by Fleet Contras March 24,2011 @ 2:47 pm  Dailly Caffeine Use    Current Outpatient Prescriptions on File Prior to Visit  Medication Sig Dispense Refill  . albuterol (PROAIR HFA) 108 (90 BASE) MCG/ACT inhaler Inhale 2 puffs into the lungs every 4 (four) hours as needed for wheezing or shortness of breath. 3 Inhaler 2  . albuterol (PROVENTIL) (2.5 MG/3ML) 0.083% nebulizer solution Take 2.5 mg by nebulization every 4 (four) hours as needed for wheezing or shortness of breath.    . Calcium Citrate (CITRACAL PO) Take by mouth.    . cholestyramine (QUESTRAN) 4 G packet Take 1 packet (4 g total) by mouth daily as needed (diarrhea). 180 each 1  . clonazePAM (KLONOPIN) 1 MG tablet Take 1 mg by mouth 2 (two) times daily as needed for anxiety.     . clopidogrel (PLAVIX) 75 MG tablet TAKE ONE (1) TABLET EACH DAY 90 tablet 3  . ferrous fumarate (HEMOCYTE - 106 MG FE) 325 (106 FE) MG TABS tablet Take 1 tablet (106 mg of iron total) by mouth daily. 30 each 3  . fluticasone (FLONASE) 50 MCG/ACT nasal spray Place 2 sprays into  both nostrils daily as needed for allergies. 48 g 3  . Fluticasone-Salmeterol (ADVAIR) 250-50 MCG/DOSE AEPB Inhale 1 puff into the lungs 2 (two) times daily. 1 each 6  . Multiple Vitamin (MULTIVITAMIN WITH MINERALS) TABS tablet Take 1 tablet by mouth daily.    . Multiple Vitamins-Minerals (OCUVITE PRESERVISION) TABS Take by mouth 2 (two) times daily.    . ondansetron (ZOFRAN) 4 MG tablet Take 1 tablet (4 mg total) by mouth every 6 (six) hours as needed for nausea or vomiting. 30 tablet 1  . pantoprazole (PROTONIX) 40 MG tablet Take 1 tablet (40 mg total) by mouth daily. 90 tablet 0  . phenytoin (DILANTIN) 100 MG ER capsule TAKE TWO CAPSULES BY MOUTH AT BEDTIME DAILY 60 capsule 3  . Polyethyl Glycol-Propyl Glycol (SYSTANE) 0.4-0.3 % SOLN Place 1 drop into both eyes 2 (two) times daily as needed.    . potassium chloride (K-DUR) 10 MEQ tablet Take 1 tablet (10 mEq total) by mouth 2 (two) times daily. 180 tablet 1  . saccharomyces boulardii (FLORASTOR) 250 MG capsule Take 250 mg by mouth daily.    . sodium chloride (OCEAN) 0.65 % SOLN nasal spray Place 2 sprays into both nostrils daily.     No current facility-administered medications on file prior to visit.    Allergies  Allergen Reactions  . Bactrim [Sulfamethoxazole-Trimethoprim] Shortness Of Breath  . Levofloxacin Other (See Comments)    seizure  . Pneumovax [Pneumococcal Polysaccharide Vaccine] Other (See Comments)    Severe allergy-cellulitis  . Amoxicillin-Pot Clavulanate Other (See Comments)    Patient stated Drs. recommend taking Augmentin due to her PCN allergy.  . Penicillins Rash  . Bupropion   . Haldol [Haloperidol Lactate]     Had unknown reaction many yrs ago per pt  . Haloperidol   . Morphine   . Morphine And Related     Confused and agitation  . Pneumococcal Vaccine   . Pseudoephedrine Hcl   . Sudafed [Pseudoephedrine Hcl] Other (See Comments)    seizure    Review of Systems  Review of Systems  Constitutional:  Negative for fever and malaise/fatigue.  HENT: Negative for congestion.   Eyes: Negative for discharge.  Respiratory: Negative for shortness of breath.   Cardiovascular: Positive for palpitations. Negative for chest pain and leg swelling.  Gastrointestinal: Positive for nausea. Negative for abdominal pain and diarrhea.  Genitourinary: Negative for dysuria.  Musculoskeletal: Negative for falls.  Skin: Negative for rash.  Neurological: Negative for loss of consciousness and headaches.  Endo/Heme/Allergies: Negative for polydipsia.  Psychiatric/Behavioral: Negative for depression and suicidal ideas. The patient is nervous/anxious. The patient does not have insomnia.     Objective  Pulse 93  Temp(Src) 98.4 F (36.9 C) (Oral)  Ht 5\' 5"  (1.651 m)  Wt 133 lb 8 oz (60.555 kg)  BMI 22.22 kg/m2  SpO2 94%  Physical Exam  Physical Exam  Constitutional: She is oriented to person, place, and time and well-developed, well-nourished, and in no distress. No distress.  HENT:  Head: Normocephalic and atraumatic.  Eyes: Conjunctivae are normal.  Neck: Neck supple. No thyromegaly present.  Cardiovascular: Normal rate and regular rhythm.   Murmur heard. Pulmonary/Chest: Effort normal and breath sounds normal. She has no wheezes.  Abdominal: She exhibits no distension and no mass.  Musculoskeletal: She exhibits no edema.  Lymphadenopathy:    She has no cervical adenopathy.  Neurological: She is alert and oriented to person, place, and time.  Skin: Skin is warm and dry. No rash noted. She is not diaphoretic.  Psychiatric: Memory, affect and judgment normal.    Lab Results  Component Value Date   TSH 2.32 09/11/2014   Lab Results  Component Value Date   WBC 5.8 11/29/2014   HGB 10.7* 11/29/2014   HCT 34.0* 11/29/2014   MCV 84 11/29/2014   PLT 116* 11/29/2014   Lab Results  Component Value Date   CREATININE 0.77 11/29/2014   BUN 8 11/29/2014   NA 141 11/29/2014   K 3.5 11/29/2014     CL 105 11/29/2014   CO2 26 11/29/2014   Lab Results  Component Value Date   ALT 10 11/29/2014   AST 28 11/29/2014   ALKPHOS 99 11/29/2014   BILITOT 0.4 11/29/2014   Lab Results  Component Value Date   CHOL 130 02/13/2014   Lab Results  Component Value Date   HDL 39* 02/13/2014   Lab Results  Component Value Date   LDLCALC 54 02/13/2014   Lab Results  Component Value Date   TRIG 185* 02/13/2014   Lab Results  Component Value Date   CHOLHDL 3.3 02/13/2014     Assessment & Plan  GERD Avoid offending foods, start probiotics. Do not eat large meals in late evening and consider raising head of bed.    DIABETES MELLITUS, TYPE II hgba1c elevated, minimize simple carbs. Increase exercise as tolerated. Continue current meds recheck A1C with next visit   ANEMIA-B12 DEFICIENCY stable   Hyperlipidemia Encouraged heart healthy diet, increase exercise, avoid trans fats, consider a krill oil cap daily

## 2014-12-16 NOTE — Assessment & Plan Note (Signed)
Encouraged heart healthy diet, increase exercise, avoid trans fats, consider a krill oil cap daily 

## 2014-12-21 ENCOUNTER — Encounter: Payer: Self-pay | Admitting: Medical

## 2014-12-21 ENCOUNTER — Telehealth: Payer: Self-pay | Admitting: Medical

## 2014-12-21 ENCOUNTER — Ambulatory Visit (HOSPITAL_BASED_OUTPATIENT_CLINIC_OR_DEPARTMENT_OTHER)
Admission: RE | Admit: 2014-12-21 | Discharge: 2014-12-21 | Disposition: A | Discharge status: Home / Self Care | Payer: Medicare Other | Source: Ambulatory Visit | Attending: Medical | Admitting: Medical

## 2014-12-21 ENCOUNTER — Ambulatory Visit (INDEPENDENT_AMBULATORY_CARE_PROVIDER_SITE_OTHER): Payer: Medicare Other | Admitting: Medical

## 2014-12-21 VITALS — BP 148/55 | HR 77 | Temp 97.9°F | Ht <= 58 in | Wt 133.2 lb

## 2014-12-21 DIAGNOSIS — R062 Wheezing: Secondary | ICD-10-CM

## 2014-12-21 DIAGNOSIS — R05 Cough: Secondary | ICD-10-CM

## 2014-12-21 DIAGNOSIS — J449 Chronic obstructive pulmonary disease, unspecified: Secondary | ICD-10-CM | POA: Diagnosis not present

## 2014-12-21 DIAGNOSIS — F17200 Nicotine dependence, unspecified, uncomplicated: Secondary | ICD-10-CM | POA: Insufficient documentation

## 2014-12-21 DIAGNOSIS — E119 Type 2 diabetes mellitus without complications: Secondary | ICD-10-CM | POA: Insufficient documentation

## 2014-12-21 DIAGNOSIS — R918 Other nonspecific abnormal finding of lung field: Secondary | ICD-10-CM | POA: Diagnosis not present

## 2014-12-21 DIAGNOSIS — R059 Cough, unspecified: Secondary | ICD-10-CM

## 2014-12-21 LAB — COMPREHENSIVE METABOLIC PANEL
ALK PHOS: 93 U/L (ref 39–117)
ALT: 10 U/L (ref 0–35)
AST: 23 U/L (ref 0–37)
Albumin: 3.4 g/dL — ABNORMAL LOW (ref 3.5–5.2)
BILIRUBIN TOTAL: 0.5 mg/dL (ref 0.2–1.2)
BUN: 10 mg/dL (ref 6–23)
CO2: 24 mEq/L (ref 19–32)
Calcium: 8.8 mg/dL (ref 8.4–10.5)
Chloride: 104 mEq/L (ref 96–112)
Creat: 0.78 mg/dL (ref 0.50–1.10)
Glucose, Bld: 171 mg/dL — ABNORMAL HIGH (ref 70–99)
Potassium: 4.4 mEq/L (ref 3.5–5.3)
SODIUM: 139 meq/L (ref 135–145)
TOTAL PROTEIN: 6.9 g/dL (ref 6.0–8.3)

## 2014-12-21 LAB — CBC WITH DIFFERENTIAL/PLATELET
BASOS PCT: 1 % (ref 0–1)
Basophils Absolute: 0.1 10*3/uL (ref 0.0–0.1)
Eosinophils Absolute: 0.2 10*3/uL (ref 0.0–0.7)
Eosinophils Relative: 3 % (ref 0–5)
HEMATOCRIT: 32.8 % — AB (ref 36.0–46.0)
HEMOGLOBIN: 10.1 g/dL — AB (ref 12.0–15.0)
LYMPHS ABS: 1.1 10*3/uL (ref 0.7–4.0)
Lymphocytes Relative: 17 % (ref 12–46)
MCH: 24.9 pg — ABNORMAL LOW (ref 26.0–34.0)
MCHC: 30.8 g/dL (ref 30.0–36.0)
MCV: 81 fL (ref 78.0–100.0)
MONO ABS: 0.6 10*3/uL (ref 0.1–1.0)
MPV: 10.2 fL (ref 8.6–12.4)
Monocytes Relative: 9 % (ref 3–12)
Neutro Abs: 4.6 10*3/uL (ref 1.7–7.7)
Neutrophils Relative %: 70 % (ref 43–77)
Platelets: 111 10*3/uL — ABNORMAL LOW (ref 150–400)
RBC: 4.05 MIL/uL (ref 3.87–5.11)
RDW: 17 % — AB (ref 11.5–15.5)
WBC: 6.5 10*3/uL (ref 4.0–10.5)

## 2014-12-21 MED ORDER — AZITHROMYCIN 250 MG PO TABS: ORAL_TABLET | ORAL | Status: DC

## 2014-12-21 MED ORDER — METFORMIN HCL 500 MG PO TABS: 500.0000 mg | ORAL_TABLET | Freq: Every day | ORAL | Status: DC

## 2014-12-21 MED ORDER — PREDNISONE 20 MG PO TABS: ORAL_TABLET | ORAL | Status: DC

## 2014-12-21 NOTE — Patient Instructions (Signed)
Wheezing With possible bronchitis. Continue advair and albuterol. Will rx low dose tapered prednisone. Will make antibiotic available  pending labs  and cxr result. If worsens clinically then would start azithromycin but at same time start probiotic.  With your hx of c dif would be cautious but at same time would not want to ignore bacterial infection.   Diabetes mellitus type 2, uncomplicated Get cmp and W2-N today. Rx metformin. Check bs while on prednisone. If bs are conistently over 250 let us know. Also be strict with diet while on prednisone.     Follow up in 7 days any persisting signs/symptoms or as needed.

## 2014-12-21 NOTE — Assessment & Plan Note (Signed)
Get cmp and a1-c today. Rx metformin. Check bs while on prednisone. If bs are conistently over 250 let us know. Also be strict with diet while on prednisone.

## 2014-12-21 NOTE — Assessment & Plan Note (Signed)
With possible bronchitis. Continue advair and albuterol. Will rx low dose tapered prednisone. Will make antibiotic available  pending labs  and cxr result. If worsens clinically then would start azithromycin but at same time start probiotic.  With your hx of c dif would be cautious but at same time would not want to ignore bacterial infection.

## 2014-12-21 NOTE — Progress Notes (Signed)
Subjective:    Patient ID: Monique Fleming, female    DOB: 07-30-1929, 81 y.o.   MRN: [id number]  HPI   Pt seen 2 wks ago. Pt was given a prednisone taper dose.  I don't  Dx related to  this in the last note.  Pt is wheezing per daughter. Albuterol this am did help. Pt is coughing. Bringing up some mucous. After breathing tx did cough up mucous. Possible fevers and chills. Pt thinks the prednisone did help a lot.   Pt o2 sat when I checked 98%.  Not reporting any chest pain. No report of any left arm pain.  Pt had c dif twice in the past and because of this she is very wary of taking any antibiotic.Pt last time reported some lower rib area pain on both sides. Points to lower ribs midaxillary area.  Pt does smokes. 1-2 ciggaretes a day. Some for 50 yrs.  Pt is diabetic. Her last a1-c was 7.8. Sunday night her blood sugar was in 200. Pt has not been taking metformin for 2 months. Today bs nonfasting 149. This am bs was 120.      Review of Systems  Constitutional: Negative for fever, chills and fatigue.  HENT: Negative for congestion, ear pain, postnasal drip, rhinorrhea, sore throat and tinnitus.   Respiratory: Positive for cough and wheezing. Negative for chest tightness, shortness of breath and stridor.   Cardiovascular: Negative for chest pain and palpitations.  Genitourinary: Negative for dysuria, urgency, frequency and flank pain.  Musculoskeletal: Negative for back pain.  Neurological: Negative for dizziness, speech difficulty, weakness, numbness and headaches.  Hematological: Negative for adenopathy. Does not bruise/bleed easily.   Past Medical History  Diagnosis Date  . Aortic stenosis     moderate by echo 6/11 gradient 20/72mmHg for mean and peak  . TIA (transient ischemic attack)     Dr. [name]  . Peripheral vascular disease   . IBS (irritable bowel syndrome)   . Diabetes mellitus     type II  . Chronic bronchitis   . COPD (chronic obstructive pulmonary disease)     . Atherosclerosis   . Depression   . Anxiety     Dr Casimiro Needle  . Renal cyst   . Anemia     iron deficiency  . Cellulitis   . Diverticular disease   . Infectious diarrhea(009.2)   . Sinusitis   . Thrush   . Abnormal liver function test   . GERD (gastroesophageal reflux disease)   . Insomnia     chronic  . Adenocarcinoma 2008    colon,s/p right hemicolectomy  . Angina   . Seizures     hx of one seizure "  . Cirrhosis of liver     hx of  . Clostridium difficile diarrhea   . Thrombocytopenia 04/01/2013  . Bursitis     spine per pt  . Macular degeneration 02/18/2014    Follows with [facility]  . Internal hemorrhoids   . Asthma     History   Social History  . Marital Status: Widowed    Spouse Name: N/A  . Number of Children: 3  . Years of Education: N/A   Occupational History  . retired   .     Social History Main Topics  . Smoking status: Current Every Day Smoker -- 0.50 packs/day for 70 years    Types: Cigarettes    Start date: 02/14/1951    Last Attempt to Quit: 01/03/2014  .  Smokeless tobacco: Never Used     Comment: Still using  Electronic cigarette  . Alcohol Use: No  . Drug Use: No  . Sexual Activity: No   Other Topics Concern  . Not on file   Social History Narrative   Patient signed a Designated Party Release to allow her daughter, Kishana Battey, to have access to her medical records/information. Entered by Fleet Contras March 24,2011 @ 2:47 pm   Dailly Caffeine Use    Past Surgical History  Procedure Laterality Date  . Carotid endarterectomy    . Colectomy      right hemi  . Cholecystectomy  11/09  . Upper gastrointestinal endoscopy    . Tonsillectomy    . Appendectomy      Family History  Problem Relation Age of Onset  . Colon cancer Paternal Grandmother   . Esophageal cancer Brother   . Heart disease Mother   . Diabetes Brother   . Hypertension Father   . Rectal cancer Neg Hx   . Stomach cancer Neg Hx     Allergies  Allergen  Reactions  . Bactrim [Sulfamethoxazole-Trimethoprim] Shortness Of Breath  . Levofloxacin Other (See Comments)    seizure  . Pneumovax [Pneumococcal Polysaccharide Vaccine] Other (See Comments)    Severe allergy-cellulitis  . Amoxicillin-Pot Clavulanate Other (See Comments)    Patient stated Drs. recommend taking Augmentin due to her PCN allergy.  . Penicillins Rash  . Bupropion   . Haldol [Haloperidol Lactate]     Had unknown reaction many yrs ago per pt  . Haloperidol   . Morphine   . Morphine And Related     Confused and agitation  . Pneumococcal Vaccine   . Pseudoephedrine Hcl   . Sudafed [Pseudoephedrine Hcl] Other (See Comments)    seizure    Current Outpatient Prescriptions on File Prior to Visit  Medication Sig Dispense Refill  . albuterol (PROAIR HFA) 108 (90 BASE) MCG/ACT inhaler Inhale 2 puffs into the lungs every 4 (four) hours as needed for wheezing or shortness of breath. 3 Inhaler 2  . albuterol (PROVENTIL) (2.5 MG/3ML) 0.083% nebulizer solution Take 2.5 mg by nebulization every 4 (four) hours as needed for wheezing or shortness of breath.    . Calcium Citrate (CITRACAL PO) Take by mouth.    . cholestyramine (QUESTRAN) 4 G packet Take 1 packet (4 g total) by mouth daily as needed (diarrhea). 180 each 1  . clonazePAM (KLONOPIN) 1 MG tablet Take 1 mg by mouth 2 (two) times daily as needed for anxiety.     . clopidogrel (PLAVIX) 75 MG tablet TAKE ONE (1) TABLET EACH DAY 90 tablet 3  . ferrous fumarate (HEMOCYTE - 106 MG FE) 325 (106 FE) MG TABS tablet Take 1 tablet (106 mg of iron total) by mouth daily. 30 each 3  . fluticasone (FLONASE) 50 MCG/ACT nasal spray Place 2 sprays into both nostrils daily as needed for allergies. 48 g 3  . Fluticasone-Salmeterol (ADVAIR) 250-50 MCG/DOSE AEPB Inhale 1 puff into the lungs 2 (two) times daily. 1 each 6  . metFORMIN (GLUCOPHAGE) 500 MG tablet Take 1 tablet (500 mg total) by mouth daily with breakfast. 30 tablet 4  .  methylPREDNIsolone (MEDROL DOSPACK) 4 MG tablet follow package directions 21 tablet 0  . Multiple Vitamin (MULTIVITAMIN WITH MINERALS) TABS tablet Take 1 tablet by mouth daily.    . Multiple Vitamins-Minerals (OCUVITE PRESERVISION) TABS Take by mouth 2 (two) times daily.    . ondansetron (ZOFRAN) 4  MG tablet Take 1 tablet (4 mg total) by mouth every 6 (six) hours as needed for nausea or vomiting. 30 tablet 1  . pantoprazole (PROTONIX) 40 MG tablet Take 1 tablet (40 mg total) by mouth daily. 90 tablet 0  . phenytoin (DILANTIN) 100 MG ER capsule TAKE TWO CAPSULES BY MOUTH AT BEDTIME DAILY 60 capsule 3  . Polyethyl Glycol-Propyl Glycol (SYSTANE) 0.4-0.3 % SOLN Place 1 drop into both eyes 2 (two) times daily as needed.    . potassium chloride (K-DUR) 10 MEQ tablet Take 1 tablet (10 mEq total) by mouth 2 (two) times daily. 180 tablet 1  . saccharomyces boulardii (FLORASTOR) 250 MG capsule Take 250 mg by mouth daily.    . sodium chloride (OCEAN) 0.65 % SOLN nasal spray Place 2 sprays into both nostrils daily.    . traMADol (ULTRAM) 50 MG tablet Take 1 tablet (50 mg total) by mouth every 12 (twelve) hours as needed for moderate pain or severe pain. 60 tablet 0   No current facility-administered medications on file prior to visit.    BP 148/55 mmHg  Pulse 77  Temp(Src) 97.9 F (36.6 C) (Oral)  Ht 5.5" (0.14 m)  Wt 133 lb 3.2 oz (60.419 kg)  BMI 3082.60 kg/m2  SpO2 100%      Objective:   Physical Exam  General  Mental Status - Alert. General Appearance - Well groomed. Not in acute distress.  Skin Rashes- No Rashes.  HEENT Head- Normal. Ear Auditory Canal - Left- Normal. Right - Normal.Tympanic Membrane- Left- Normal. Right- Normal. Eye Sclera/Conjunctiva- Left- Normal. Right- Normal. Nose & Sinuses Nasal Mucosa- Left-  Not boggy or Congested. Right-  Not  boggy or Congested. Mouth & Throat Lips: Upper Lip- Normal: no dryness, cracking, pallor, cyanosis, or vesicular eruption. Lower  Lip-Normal: no dryness, cracking, pallor, cyanosis or vesicular eruption. Buccal Mucosa- Bilateral- No Aphthous ulcers. Oropharynx- No Discharge or Erythema. Tonsils: Characteristics- Bilateral- No Erythema or Congestion. Size/Enlargement- Bilateral- No enlargement. Discharge- bilateral-None.  Neck Neck- Supple. No Masses.   Chest and Lung Exam Auscultation: Breath Sounds:- even and unlabored, but bilateral upper lobe rhonchi with some wheezing.  Cardiovascular Auscultation:Rythm- Regular, rate and rhythm. Murmurs & Other Heart Sounds:Ausculatation of the heart reveal- No Murmurs.  Lymphatic Head & Neck General Head & Neck Lymphatics: Bilateral: Description- No Localized lymphadenopathy.       Assessment & Plan:

## 2014-12-21 NOTE — Addendum Note (Signed)
Addended by: Harl Bowie on: 12/21/2014 03:21 PM   Modules accepted: Orders

## 2014-12-21 NOTE — Progress Notes (Signed)
Pre visit review using our clinic review tool, if applicable. No additional management support is needed unless otherwise documented below in the visit note. 

## 2014-12-21 NOTE — Telephone Encounter (Signed)
Refill meftormin.

## 2014-12-22 LAB — HEMOGLOBIN A1C
Hgb A1c MFr Bld: 7.7 % — ABNORMAL HIGH (ref <5.7)
MEAN PLASMA GLUCOSE: 174 mg/dL — AB (ref <117)

## 2014-12-25 ENCOUNTER — Ambulatory Visit: Payer: Medicare Other | Admitting: Adult Health

## 2014-12-25 ENCOUNTER — Telehealth: Payer: Self-pay | Admitting: Family Medicine

## 2014-12-25 NOTE — Telephone Encounter (Signed)
Caller name: Monique Fleming Relation to pt: self Call back number: [phone] Pharmacy: [facility]  Reason for call:   Patient states that she is not any better since last visit and now has a headache and wants to know what else she should do

## 2014-12-25 NOTE — Telephone Encounter (Signed)
I need to see pt or she needs to see Dr. Randel Pigg. Keeps failing tx. Diabetes and hx of c dif as well as her age makes treating her copd/possible pneumonia difficult. Already on farirly aggressive tx.

## 2014-12-26 ENCOUNTER — Other Ambulatory Visit: Payer: Self-pay

## 2014-12-26 ENCOUNTER — Telehealth: Payer: Self-pay | Admitting: Critical Care Medicine

## 2014-12-26 DIAGNOSIS — R0789 Other chest pain: Secondary | ICD-10-CM

## 2014-12-26 DIAGNOSIS — R059 Cough, unspecified: Secondary | ICD-10-CM

## 2014-12-26 DIAGNOSIS — R05 Cough: Secondary | ICD-10-CM

## 2014-12-26 NOTE — Telephone Encounter (Signed)
Patient has not been feeling well.  Patient thinks she has pneumonia.  She was given prednisone and antibiotic.  She has completed the antibiotic and has just 2 prednisone left.  Off and on has tightness in chest.  Having chills, but doesn't think she has fever.  Wants to make sure doctor is aware that she has history of C-diff.  Patient wanted me to send message directly to Dr. Joya Gaskins in hopes that she would be able to see him tomorrow at [facility] office.  I advised patient that PW is gone for the day, she said okay to contact her in the morning.  PW - please advise.

## 2014-12-27 ENCOUNTER — Encounter: Payer: Self-pay | Admitting: Critical Care Medicine

## 2014-12-27 ENCOUNTER — Ambulatory Visit (INDEPENDENT_AMBULATORY_CARE_PROVIDER_SITE_OTHER): Payer: Medicare Other | Admitting: Critical Care Medicine

## 2014-12-27 ENCOUNTER — Ambulatory Visit (HOSPITAL_BASED_OUTPATIENT_CLINIC_OR_DEPARTMENT_OTHER)
Admission: RE | Admit: 2014-12-27 | Discharge: 2014-12-27 | Disposition: A | Discharge status: Home / Self Care | Payer: Medicare Other | Source: Ambulatory Visit | Attending: Critical Care Medicine | Admitting: Critical Care Medicine

## 2014-12-27 VITALS — BP 150/77 | HR 93 | Temp 97.5°F | Ht 65.0 in | Wt 127.0 lb

## 2014-12-27 DIAGNOSIS — R05 Cough: Secondary | ICD-10-CM | POA: Diagnosis not present

## 2014-12-27 DIAGNOSIS — R0989 Other specified symptoms and signs involving the circulatory and respiratory systems: Secondary | ICD-10-CM | POA: Diagnosis not present

## 2014-12-27 DIAGNOSIS — Z87891 Personal history of nicotine dependence: Secondary | ICD-10-CM | POA: Diagnosis not present

## 2014-12-27 DIAGNOSIS — J449 Chronic obstructive pulmonary disease, unspecified: Secondary | ICD-10-CM

## 2014-12-27 DIAGNOSIS — R0789 Other chest pain: Secondary | ICD-10-CM

## 2014-12-27 DIAGNOSIS — R059 Cough, unspecified: Secondary | ICD-10-CM

## 2014-12-27 MED ORDER — PREDNISONE 20 MG PO TABS: ORAL_TABLET | ORAL | Status: DC

## 2014-12-27 NOTE — Telephone Encounter (Signed)
PAtient states she saw Dr. Randel Pigg this morning.

## 2014-12-27 NOTE — Telephone Encounter (Signed)
Pt has been placed on PW's schedule for today in [place]. Order will be placed for CXR. Pt is aware of location and to have her CXR done before hand.

## 2014-12-27 NOTE — Telephone Encounter (Signed)
If she can come to [place], work in to schedule, needs CXR first

## 2014-12-27 NOTE — Progress Notes (Signed)
Subjective:    Patient ID: Monique Fleming, female    DOB: 21-Oct-1928, 81 y.o.   MRN: [id number]  HPI 64 yoF  smoker with COPD complicated by DM, ASCVD, hx colon cancer, anemia., GERD, esophageal varices  12/27/2014 Chief Complaint  Patient presents with  . Follow-up    cough, sob, CXR today  Pt became ill 2 weeks ago:  ??viral illness: headache, ache all over, low grade fever, no real cough, noted some wheezing, Rx Abx and pred per pcp.  Now finishing pred up. Also rx zpak.    Symptoms now:  Chest tightness, mucus was prod white, no discolored mucus, notes some wheezing, notes some sinus congestion.    Review of Systems Constitutional:   No  weight loss, night sweats,  Fevers, chills,  +fatigue, or  lassitude.  HEENT:   No headaches,  Difficulty swallowing,  Tooth/dental problems, or  Sore throat,       No sneezing, itching, ear ache, nasal congestion, post nasal drip,   CV:  No chest pain,  Orthopnea, PND, swelling in lower extremities, anasarca, dizziness, palpitations, syncope.   GI  No heartburn, indigestion, abdominal pain, nausea, vomiting, diarrhea, change in bowel habits, loss of appetite, bloody stools.   Resp:    No chest wall deformity  Skin: no rash or lesions.  GU: no dysuria, change in color of urine, no urgency or frequency.  No flank pain, no hematuria   MS:  No joint pain or swelling.  No decreased range of motion.  No back pain.  Psych:  No change in mood or affect. No depression or anxiety.  No memory loss.         Objective:   Physical Exam  BP 150/77 mmHg  Pulse 93  Temp(Src) 97.5 F (36.4 C) (Oral)  Ht 5\' 5"  (1.651 m)  Wt 127 lb (57.607 kg)  BMI 21.13 kg/m2  SpO2 97%  GEN: A/Ox3; pleasant , NAD, elderly   HEENT:  [place]/AT,  EACs-clear, TMs-wnl, NOSE-clear, THROAT-clear, no lesions, no postnasal drip or exudate noted.   NECK:  Supple w/ fair ROM; no JVD; normal carotid impulses w/o bruits; no thyromegaly or nodules palpated; no  lymphadenopathy.  RESP decreased BS in bases , few exp wheezes  no accessory muscle use, no dullness to percussion  CARD:  RRR, no m/r/g  , no peripheral edema, pulses intact, no cyanosis or clubbing.  GI:   Soft & nt; nml bowel sounds; no organomegaly or masses detected.  Musco: Warm bil, no deformities or joint swelling noted.   Neuro: alert, no focal deficits noted.    Skin: Warm, no lesions or rashes  CXR: NAD 12/27/14    Assessment & Plan:   COPD with chronic bronchitis Copd with acute bronchitis flare Plan We will work on obtaining Perforomist through [facility] for you to use twice daily in the nebulizer machine. We will order a visiting nurse to come to your home Take Prednisone 10 mg tablets 40 mg x 3 days, 30 mg x 3 days, 20 mg x 3 days, 10 mg x 3 days, then back to 5 mg daily.  10 mg tablets sent to pharmacy downstairs. Follow up in 1 month     Updated Medication List Outpatient Encounter Prescriptions as of 12/27/2014  Medication Sig  . albuterol (PROAIR HFA) 108 (90 BASE) MCG/ACT inhaler Inhale 2 puffs into the lungs every 4 (four) hours as needed for wheezing or shortness of breath.  Monique Fleming albuterol (  PROVENTIL) (2.5 MG/3ML) 0.083% nebulizer solution Take 2.5 mg by nebulization every 4 (four) hours as needed for wheezing or shortness of breath.  . Calcium Citrate (CITRACAL PO) Take by mouth.  . cholestyramine (QUESTRAN) 4 G packet Take 1 packet (4 g total) by mouth daily as needed (diarrhea).  . clonazePAM (KLONOPIN) 1 MG tablet Take 1 mg by mouth 2 (two) times daily as needed for anxiety.   . clopidogrel (PLAVIX) 75 MG tablet TAKE ONE (1) TABLET EACH DAY  . ferrous fumarate (HEMOCYTE - 106 MG FE) 325 (106 FE) MG TABS tablet Take 1 tablet (106 mg of iron total) by mouth daily.  . fluticasone (FLONASE) 50 MCG/ACT nasal spray Place 2 sprays into both nostrils daily as needed for allergies.  . Fluticasone-Salmeterol (ADVAIR) 250-50 MCG/DOSE AEPB Inhale  1 puff into the lungs 2 (two) times daily.  . metFORMIN (GLUCOPHAGE) 500 MG tablet Take 1 tablet (500 mg total) by mouth daily with breakfast.  . methylPREDNIsolone (MEDROL DOSPACK) 4 MG tablet follow package directions  . Multiple Vitamin (MULTIVITAMIN WITH MINERALS) TABS tablet Take 1 tablet by mouth daily.  . Multiple Vitamins-Minerals (OCUVITE PRESERVISION) TABS Take by mouth 2 (two) times daily.  . ondansetron (ZOFRAN) 4 MG tablet Take 1 tablet (4 mg total) by mouth every 6 (six) hours as needed for nausea or vomiting.  . pantoprazole (PROTONIX) 40 MG tablet Take 1 tablet (40 mg total) by mouth daily.  . phenytoin (DILANTIN) 100 MG ER capsule TAKE TWO CAPSULES BY MOUTH AT BEDTIME DAILY  . Polyethyl Glycol-Propyl Glycol (SYSTANE) 0.4-0.3 % SOLN Place 1 drop into both eyes 2 (two) times daily as needed.  . potassium chloride (K-DUR) 10 MEQ tablet Take 1 tablet (10 mEq total) by mouth 2 (two) times daily.  . predniSONE (DELTASONE) 20 MG tablet Take 4 for three days 3 for three days 2 for three days 1 for three days and stop  . saccharomyces boulardii (FLORASTOR) 250 MG capsule Take 250 mg by mouth daily.  . sodium chloride (OCEAN) 0.65 % SOLN nasal spray Place 2 sprays into both nostrils daily.  . traMADol (ULTRAM) 50 MG tablet Take 1 tablet (50 mg total) by mouth every 12 (twelve) hours as needed for moderate pain or severe pain.  . [DISCONTINUED] predniSONE (DELTASONE) 20 MG tablet 1 tab po bid x 3 days then 1 tab q day x 2 days.  . [DISCONTINUED] azithromycin (ZITHROMAX) 250 MG tablet Take 2 tablets by mouth on day 1, followed by 1 tablet by mouth daily for 4 days. (Patient not taking: Reported on 12/27/2014)

## 2014-12-27 NOTE — Patient Instructions (Signed)
REcycle prednisone 10mg  Take 4 for three days 3 for three days 2 for three days 1 for three days and stop (sent to downstairs pharmacy) No other changes Return 2 months

## 2014-12-28 NOTE — Assessment & Plan Note (Signed)
Copd with acute bronchitis flare Plan We will work on obtaining Perforomist through [facility] for you to use twice daily in the nebulizer machine. We will order a visiting nurse to come to your home Take Prednisone 10 mg tablets 40 mg x 3 days, 30 mg x 3 days, 20 mg x 3 days, 10 mg x 3 days, then back to 5 mg daily.  10 mg tablets sent to pharmacy downstairs. Follow up in 1 month

## 2015-01-01 ENCOUNTER — Telehealth: Payer: Self-pay | Admitting: Family Medicine

## 2015-01-01 MED ORDER — ALBUTEROL SULFATE (2.5 MG/3ML) 0.083% IN NEBU: 2.5000 mg | INHALATION_SOLUTION | RESPIRATORY_TRACT | Status: DC | PRN

## 2015-01-01 NOTE — Telephone Encounter (Signed)
Caller name:Fenderson, Mechele Claude Relation to LK:GMWN Call back number:[phone] Pharmacy:[facility]  Reason for call: pt is needing rx   traMADol (ULTRAM) 50 MG tablet     phenytoin (DILANTIN) 100 MG ER capsule and would like for you to give her a call regarding the other rx she needs states she has to explain it you want it is she is needing because she did not know the name of it.

## 2015-01-01 NOTE — Telephone Encounter (Signed)
Called the patient informed Dilantin was filled on 11/06/14  #60 with 3 refills and should be refills left and to call her pharmacy for a refill.  Also, informed too early for Tramadol as was just filled on 12/07/14 #60 with 0 refills and should call back when refill is due to be filled again. The patient did request a refill on her nebulizer solution which I did send in to her [facility] as she requested.  The patient did verbalize an understanding regarding all refills.

## 2015-01-06 NOTE — Progress Notes (Signed)
Patient ID: Monique Fleming, female   DOB: January 11, 1929, 81 y.o.   MRN: [id number]  81 y.o.  referred by Dr Randel Pigg for vascular disease and AS.  She has a history of CVA , cognitive impairment, and seizure . Her last carotid duplex 8/14 showed bilateral 50-60% ICA disease.  She is a poor historian  Denies dyspnea, chest pain syncope or palpitations.  In 2012 only mild AS with mean gradient 12 mmHg and normal EF 70%.  She has not had f/u since then  She is on dilantin for seizures  On plavix for stroke prevention     ROS: Denies fever, malais, weight loss, blurry vision, decreased visual acuity, cough, sputum, SOB, hemoptysis, pleuritic pain, palpitaitons, heartburn, abdominal pain, melena, lower extremity edema, claudication, or rash.  All other systems reviewed and negative   General: Affect appropriate Frail demented white female  HEENT: normal Neck supple with no adenopathy JVP normal bilatral  bruits no thyromegaly Lungs clear with no wheezing and good diaphragmatic motion Heart:  S1/S2 preserved AS  murmur,rub, gallop or click PMI normal Abdomen: benighn, BS positve, no tenderness, no AAA no bruit.  No HSM or HJR Distal pulses intact with no bruits No edema Neuro non-focal Skin warm and dry No muscular weakness  Medications Current Outpatient Prescriptions  Medication Sig Dispense Refill  . albuterol (PROAIR HFA) 108 (90 BASE) MCG/ACT inhaler Inhale 2 puffs into the lungs every 4 (four) hours as needed for wheezing or shortness of breath. 3 Inhaler 2  . albuterol (PROVENTIL) (2.5 MG/3ML) 0.083% nebulizer solution Take 3 mLs (2.5 mg total) by nebulization every 4 (four) hours as needed for wheezing or shortness of breath. 75 mL 2  . Calcium Citrate (CITRACAL PO) Take by mouth.    . cholestyramine (QUESTRAN) 4 G packet Take 1 packet (4 g total) by mouth daily as needed (diarrhea). 180 each 1  . clonazePAM (KLONOPIN) 1 MG tablet Take 1 mg by mouth 2 (two) times daily as needed for  anxiety.     . clopidogrel (PLAVIX) 75 MG tablet TAKE ONE (1) TABLET EACH DAY 90 tablet 3  . ferrous fumarate (HEMOCYTE - 106 MG FE) 325 (106 FE) MG TABS tablet Take 1 tablet (106 mg of iron total) by mouth daily. 30 each 3  . fluticasone (FLONASE) 50 MCG/ACT nasal spray Place 2 sprays into both nostrils daily as needed for allergies. 48 g 3  . Fluticasone-Salmeterol (ADVAIR) 250-50 MCG/DOSE AEPB Inhale 1 puff into the lungs 2 (two) times daily. 1 each 6  . metFORMIN (GLUCOPHAGE) 500 MG tablet Take 1 tablet (500 mg total) by mouth daily with breakfast. 30 tablet 0  . methylPREDNIsolone (MEDROL DOSPACK) 4 MG tablet follow package directions 21 tablet 0  . Multiple Vitamin (MULTIVITAMIN WITH MINERALS) TABS tablet Take 1 tablet by mouth daily.    . Multiple Vitamins-Minerals (OCUVITE PRESERVISION) TABS Take by mouth 2 (two) times daily.    . ondansetron (ZOFRAN) 4 MG tablet Take 1 tablet (4 mg total) by mouth every 6 (six) hours as needed for nausea or vomiting. 30 tablet 1  . pantoprazole (PROTONIX) 40 MG tablet Take 1 tablet (40 mg total) by mouth daily. 90 tablet 0  . phenytoin (DILANTIN) 100 MG ER capsule TAKE TWO CAPSULES BY MOUTH AT BEDTIME DAILY 60 capsule 3  . Polyethyl Glycol-Propyl Glycol (SYSTANE) 0.4-0.3 % SOLN Place 1 drop into both eyes 2 (two) times daily as needed.    . potassium chloride (K-DUR) 10 MEQ tablet  Take 1 tablet (10 mEq total) by mouth 2 (two) times daily. 180 tablet 1  . predniSONE (DELTASONE) 20 MG tablet Take 4 for three days 3 for three days 2 for three days 1 for three days and stop 30 tablet 10  . saccharomyces boulardii (FLORASTOR) 250 MG capsule Take 250 mg by mouth daily.    . sodium chloride (OCEAN) 0.65 % SOLN nasal spray Place 2 sprays into both nostrils daily.    . traMADol (ULTRAM) 50 MG tablet Take 1 tablet (50 mg total) by mouth every 12 (twelve) hours as needed for moderate pain or severe pain. 60 tablet 0   No current facility-administered medications for  this visit.    Allergies Bactrim; Levofloxacin; Pneumovax; Amoxicillin-pot clavulanate; Penicillins; Bupropion; Haldol; Haloperidol; Morphine; Morphine and related; Pneumococcal vaccine; Pseudoephedrine hcl; and Sudafed  Family History: Family History  Problem Relation Age of Onset  . Colon cancer Paternal Grandmother   . Esophageal cancer Brother   . Heart disease Mother   . Diabetes Brother   . Hypertension Father   . Rectal cancer Neg Hx   . Stomach cancer Neg Hx     Social History: History   Social History  . Marital Status: Widowed    Spouse Name: N/A  . Number of Children: 3  . Years of Education: N/A   Occupational History  . retired   .     Social History Main Topics  . Smoking status: Former Smoker -- 0.50 packs/day for 70 years    Types: Cigarettes    Start date: 02/14/1951    Quit date: 01/03/2014  . Smokeless tobacco: Never Used     Comment: Still using  Electronic cigarette  . Alcohol Use: No  . Drug Use: No  . Sexual Activity: No   Other Topics Concern  . Not on file   Social History Narrative   Patient signed a Designated Party Release to allow her daughter, Monique Fleming, to have access to her medical records/information. Entered by Fleet Contras March 24,2011 @ 2:47 pm   Dailly Caffeine Use    Electrocardiogram:  08/23/14  SR rate 71  Nonspecific ST changes tall R wave V1-3 pulmonary disease  Assessment and Plan CVA:  F/u Sethi  Carotid duplex ordered  Continue DATR AS:  Mid peaking murmur f/u echo mild gradients a year ago HTN:  Labile continue current medication  COPD:  Recent uri with steroid taper improved f/u Dr Mariel Kansky

## 2015-01-07 ENCOUNTER — Ambulatory Visit (HOSPITAL_COMMUNITY): Payer: Medicare Other | Attending: Cardiology | Admitting: Radiology

## 2015-01-07 ENCOUNTER — Ambulatory Visit (HOSPITAL_BASED_OUTPATIENT_CLINIC_OR_DEPARTMENT_OTHER): Payer: Medicare Other | Admitting: Cardiology

## 2015-01-07 ENCOUNTER — Telehealth: Payer: Self-pay | Admitting: Family Medicine

## 2015-01-07 ENCOUNTER — Ambulatory Visit (INDEPENDENT_AMBULATORY_CARE_PROVIDER_SITE_OTHER): Payer: Medicare Other | Admitting: Cardiovascular Disease

## 2015-01-07 VITALS — BP 140/80 | HR 84 | Ht 65.0 in | Wt 130.8 lb

## 2015-01-07 DIAGNOSIS — I6523 Occlusion and stenosis of bilateral carotid arteries: Secondary | ICD-10-CM | POA: Diagnosis not present

## 2015-01-07 DIAGNOSIS — R0989 Other specified symptoms and signs involving the circulatory and respiratory systems: Secondary | ICD-10-CM

## 2015-01-07 DIAGNOSIS — I350 Nonrheumatic aortic (valve) stenosis: Secondary | ICD-10-CM | POA: Diagnosis not present

## 2015-01-07 NOTE — Patient Instructions (Signed)
Your physician wants you to follow-up in:    6 MONTHS WITH DR  NISHAN  You will receive a reminder letter in the mail two months in advance. If you don't receive a letter, please call our office to schedule the follow-up appointment.  Your physician recommends that you continue on your current medications as directed. Please refer to the Current Medication list given to you today.   Your physician has requested that you have a carotid duplex. This test is an ultrasound of the carotid arteries in your neck. It looks at blood flow through these arteries that supply the brain with blood. Allow one hour for this exam. There are no restrictions or special instructions.   

## 2015-01-07 NOTE — Progress Notes (Signed)
Echocardiogram performed.  

## 2015-01-07 NOTE — Telephone Encounter (Signed)
Called patient to follow-up but phone rang continuously.  Unable to leave message.  eal

## 2015-01-07 NOTE — Telephone Encounter (Signed)
°  Relation to pt: self  Call back number: [phone] Pharmacy:  Reason for call:  Pt requesting a refill traMADol (ULTRAM) 50 MG tablet, and states since shes been on metFORMIN (GLUCOPHAGE) 500 MG tablet her BS is 200 to 300. Pt was triage to team health

## 2015-01-07 NOTE — Progress Notes (Signed)
Carotid duplex performed 

## 2015-01-08 ENCOUNTER — Telehealth: Payer: Self-pay | Admitting: Family Medicine

## 2015-01-08 ENCOUNTER — Encounter: Payer: Self-pay | Admitting: Cardiovascular Disease

## 2015-01-08 MED ORDER — METFORMIN HCL 500 MG PO TABS: 500.0000 mg | ORAL_TABLET | Freq: Every day | ORAL | Status: DC

## 2015-01-08 NOTE — Telephone Encounter (Signed)
°  Relation to pt: self   Call back number: [phone]   Reason for call:  Pt requesting a refill traMADol (ULTRAM) 50 MG tablet (completely out) and metFORMIN (GLUCOPHAGE) 500 MG tablet. Pt states please put the request by 2:30pm because they will deliver.

## 2015-01-08 NOTE — Telephone Encounter (Signed)
Metformin sent to [facility] per patient preference.    Tramadol:  LOV 12/07/14 Last refill: 12/07/14 for 60 and 0.  Contract: 12/07/14 No UDS  Please advise.

## 2015-01-09 ENCOUNTER — Encounter: Payer: Self-pay | Admitting: Family Medicine

## 2015-01-09 MED ORDER — TRAMADOL HCL 50 MG PO TABS: 50.0000 mg | ORAL_TABLET | Freq: Two times a day (BID) | ORAL | Status: DC | PRN

## 2015-01-09 NOTE — Telephone Encounter (Signed)
Tramadol Rx printed, awaiting MD signature.

## 2015-01-09 NOTE — Telephone Encounter (Signed)
OK to refill Tramadol same sig, same strength, same number

## 2015-01-10 NOTE — Telephone Encounter (Signed)
Rx faxed to [facility].  

## 2015-01-11 ENCOUNTER — Encounter: Payer: Self-pay | Admitting: Critical Care Medicine

## 2015-01-11 ENCOUNTER — Encounter: Payer: Self-pay | Admitting: Physician Assistant

## 2015-01-11 ENCOUNTER — Ambulatory Visit (INDEPENDENT_AMBULATORY_CARE_PROVIDER_SITE_OTHER): Payer: Medicare Other | Admitting: Physician Assistant

## 2015-01-11 VITALS — BP 126/48 | HR 89 | Temp 98.6°F | Resp 18 | Ht 65.0 in

## 2015-01-11 DIAGNOSIS — R5382 Chronic fatigue, unspecified: Secondary | ICD-10-CM | POA: Diagnosis not present

## 2015-01-11 DIAGNOSIS — J42 Unspecified chronic bronchitis: Secondary | ICD-10-CM | POA: Diagnosis not present

## 2015-01-11 LAB — POCT URINALYSIS DIPSTICK
Bilirubin, UA: NEGATIVE
Blood, UA: NEGATIVE
Glucose, UA: NEGATIVE
Ketones, UA: NEGATIVE
LEUKOCYTES UA: NEGATIVE
Nitrite, UA: NEGATIVE
Protein, UA: POSITIVE
Spec Grav, UA: 1.025
Urobilinogen, UA: 0.2
pH, UA: 6

## 2015-01-11 MED ORDER — IPRATROPIUM-ALBUTEROL 0.5-2.5 (3) MG/3ML IN SOLN
3.0000 mL | RESPIRATORY_TRACT | Status: DC
  Administered 2015-01-11: 3 mL via RESPIRATORY_TRACT

## 2015-01-11 MED ORDER — MONTELUKAST SODIUM 10 MG PO TABS: 10.0000 mg | ORAL_TABLET | Freq: Every day | ORAL | Status: DC

## 2015-01-11 NOTE — Patient Instructions (Signed)
Your exam and urine testing look great. Please continue your current medication regimen. Start the Singulair daily as directed. Follow-up with Dr. Joya Gaskins in 2 weeks.

## 2015-01-11 NOTE — Telephone Encounter (Signed)
COPD with chronic bronchitis - Monique Stain, MD at 12/28/2014 10:21 PM     Status: Written Related Problem: COPD with chronic bronchitis   Expand All Collapse All   Copd with acute bronchitis flare Plan We will work on obtaining Perforomist through [facility] for you to use twice daily in the nebulizer machine. We will order a visiting nurse to come to your home Take Prednisone 10 mg tablets 40 mg x 3 days, 30 mg x 3 days, 20 mg x 3 days, 10 mg x 3 days, then back to 5 mg daily. 10 mg tablets sent to pharmacy downstairs. Follow up in 1 month          PW - please advise. There is no documentation about Per[facility]omist being sent to [facility].

## 2015-01-13 LAB — CULTURE, URINE COMPREHENSIVE
COLONY COUNT: NO GROWTH
ORGANISM ID, BACTERIA: NO GROWTH

## 2015-01-13 NOTE — Progress Notes (Signed)
Patient presents to clinic today c/o fatigue, wheezing and one episode of low-grade fever.  Patient recently treated by PCP and Pulmonology for COPD exacerbation.  Endorses fatigue since resolution of exacerbation.  Denies new symptoms.  Is taking her chronic medications as directed.  Endorses some increased fatigue.  Also endorses runny nose and PND. States she felt feverish one night but did not check temperature.  Recent X-ray unremarkable.   Past Medical History  Diagnosis Date  . Aortic stenosis     moderate by echo 6/11 gradient 20/18mmHg for mean and peak  . TIA (transient ischemic attack)     Dr. Pearlean Brownie  . Peripheral vascular disease   . IBS (irritable bowel syndrome)   . Diabetes mellitus     type II  . Chronic bronchitis   . COPD (chronic obstructive pulmonary disease)   . Atherosclerosis   . Depression   . Anxiety     Dr Donell Beers  . Renal cyst   . Anemia     iron deficiency  . Cellulitis   . Diverticular disease   . Infectious diarrhea(009.2)   . Sinusitis   . Thrush   . Abnormal liver function test   . GERD (gastroesophageal reflux disease)   . Insomnia     chronic  . Adenocarcinoma 2008    colon,s/p right hemicolectomy  . Angina   . Seizures     hx of one seizure "  . Cirrhosis of liver     hx of  . Clostridium difficile diarrhea   . Thrombocytopenia 04/01/2013  . Bursitis     spine per pt  . Macular degeneration 02/18/2014    Follows with [facility]  . Internal hemorrhoids   . Asthma     Current Outpatient Prescriptions on File Prior to Visit  Medication Sig Dispense Refill  . albuterol (PROAIR HFA) 108 (90 BASE) MCG/ACT inhaler Inhale 2 puffs into the lungs every 4 (four) hours as needed for wheezing or shortness of breath. 3 Inhaler 2  . albuterol (PROVENTIL) (2.5 MG/3ML) 0.083% nebulizer solution Take 3 mLs (2.5 mg total) by nebulization every 4 (four) hours as needed for wheezing or shortness of breath. 75 mL 2  . Calcium Citrate (CITRACAL PO)  Take by mouth.    . cholestyramine (QUESTRAN) 4 G packet Take 1 packet (4 g total) by mouth daily as needed (diarrhea). 180 each 1  . clonazePAM (KLONOPIN) 1 MG tablet Take 1 mg by mouth 2 (two) times daily as needed for anxiety.     . clopidogrel (PLAVIX) 75 MG tablet TAKE ONE (1) TABLET EACH DAY 90 tablet 3  . ferrous fumarate (HEMOCYTE - 106 MG FE) 325 (106 FE) MG TABS tablet Take 1 tablet (106 mg of iron total) by mouth daily. 30 each 3  . fluticasone (FLONASE) 50 MCG/ACT nasal spray Place 2 sprays into both nostrils daily as needed for allergies. 48 g 3  . Fluticasone-Salmeterol (ADVAIR) 250-50 MCG/DOSE AEPB Inhale 1 puff into the lungs 2 (two) times daily. 1 each 6  . metFORMIN (GLUCOPHAGE) 500 MG tablet Take 1 tablet (500 mg total) by mouth daily with breakfast. 30 tablet 5  . Multiple Vitamin (MULTIVITAMIN WITH MINERALS) TABS tablet Take 1 tablet by mouth daily.    . Multiple Vitamins-Minerals (OCUVITE PRESERVISION) TABS Take by mouth 2 (two) times daily.    . ondansetron (ZOFRAN) 4 MG tablet Take 1 tablet (4 mg total) by mouth every 6 (six) hours as needed for nausea  or vomiting. 30 tablet 1  . pantoprazole (PROTONIX) 40 MG tablet Take 1 tablet (40 mg total) by mouth daily. 90 tablet 0  . phenytoin (DILANTIN) 100 MG ER capsule TAKE TWO CAPSULES BY MOUTH AT BEDTIME DAILY 60 capsule 3  . Polyethyl Glycol-Propyl Glycol (SYSTANE) 0.4-0.3 % SOLN Place 1 drop into both eyes 2 (two) times daily as needed.    . potassium chloride (K-DUR) 10 MEQ tablet Take 1 tablet (10 mEq total) by mouth 2 (two) times daily. 180 tablet 1  . saccharomyces boulardii (FLORASTOR) 250 MG capsule Take 250 mg by mouth daily.    . sodium chloride (OCEAN) 0.65 % SOLN nasal spray Place 2 sprays into both nostrils daily.    . traMADol (ULTRAM) 50 MG tablet Take 1 tablet (50 mg total) by mouth every 12 (twelve) hours as needed for moderate pain or severe pain. 60 tablet 0   No current facility-administered medications on  file prior to visit.    Allergies  Allergen Reactions  . Bactrim [Sulfamethoxazole-Trimethoprim] Shortness Of Breath  . Levofloxacin Other (See Comments)    seizure  . Pneumovax [Pneumococcal Polysaccharide Vaccine] Other (See Comments)    Severe allergy-cellulitis  . Amoxicillin-Pot Clavulanate Other (See Comments)    Patient stated Drs. recommend taking Augmentin due to her PCN allergy.  . Penicillins Rash  . Bupropion   . Haldol [Haloperidol Lactate]     Had unknown reaction many yrs ago per pt  . Haloperidol   . Morphine   . Morphine And Related     Confused and agitation  . Pneumococcal Vaccine   . Pseudoephedrine Hcl   . Sudafed [Pseudoephedrine Hcl] Other (See Comments)    seizure    Family History  Problem Relation Age of Onset  . Colon cancer Paternal Grandmother   . Esophageal cancer Brother   . Heart disease Mother   . Diabetes Brother   . Hypertension Father   . Rectal cancer Neg Hx   . Stomach cancer Neg Hx     History   Social History  . Marital Status: Widowed    Spouse Name: N/A  . Number of Children: 3  . Years of Education: N/A   Occupational History  . retired   .     Social History Main Topics  . Smoking status: Former Smoker -- 0.50 packs/day for 70 years    Types: Cigarettes    Start date: 02/14/1949    Quit date: 01/03/2014  . Smokeless tobacco: Never Used     Comment: Still using  Electronic cigarette  . Alcohol Use: No  . Drug Use: No  . Sexual Activity: No   Other Topics Concern  . None   Social History Narrative   Patient signed a Environmental consultant to allow her daughter, Robertha Staples, to have access to her medical records/information. Entered by Fleet Contras March 24,2011 @ 2:47 pm   Dailly Caffeine Use    Review of Systems - See HPI.  All other ROS are negative.  BP 126/48 mmHg  Pulse 89  Temp(Src) 98.6 F (37 C) (Oral)  Resp 18  Ht $R'5\' 5"'bZ$  (1.651 m)  Wt   SpO2 99%  Physical Exam  Constitutional: She is  oriented to person, place, and time and well-developed, well-nourished, and in no distress.  HENT:  Head: Normocephalic and atraumatic.  Right Ear: External ear normal.  Left Ear: External ear normal.  Nose: Nose normal.  Mouth/Throat: Oropharynx is clear and moist. No oropharyngeal  exudate.  TM within normal limits bilaterally.  Eyes: Conjunctivae are normal.  Neck: Neck supple.  Cardiovascular: Normal rate, regular rhythm, normal heart sounds and intact distal pulses.   Pulmonary/Chest: Effort normal and breath sounds normal. No respiratory distress. She has no wheezes. She has no rales. She exhibits no tenderness.  Neurological: She is alert and oriented to person, place, and time.  Skin: Skin is warm and dry. No rash noted.  Psychiatric: Affect normal.  Vitals reviewed.   Recent Results (from the past 2160 hour(s))  CBC with Differential [facility])     Status: Abnormal   Collection Time: 11/29/14  1:23 PM  Result Value Ref Range   WBC 5.8 3.9 - 10.0 10e3/uL   RBC 4.05 3.70 - 5.32 10e6/uL   HGB 10.7 (L) 11.6 - 15.9 g/dL   HCT 34.0 (L) 34.8 - 46.6 %   MCV 84 81 - 101 fL   MCH 26.4 26.0 - 34.0 pg   MCHC 31.5 (L) 32.0 - 36.0 g/dL   RDW 15.9 (H) 11.1 - 15.7 %   Platelets 116 (L) 145 - 400 10e3/uL   NEUT# 3.9 1.5 - 6.5 10e3/uL   LYMPH# 1.2 0.9 - 3.3 10e3/uL   MONO# 0.6 0.1 - 0.9 10e3/uL   Eosinophils Absolute 0.2 0.0 - 0.5 10e3/uL   BASO# 0.0 0.0 - 0.2 10e3/uL   NEUT% 67.1 39.6 - 80.0 %   LYMPH% 20.1 14.0 - 48.0 %   MONO% 9.9 0.0 - 13.0 %   EOS% 2.6 0.0 - 7.0 %   BASO% 0.3 0.0 - 2.0 %  Comprehensive metabolic panel     Status: Abnormal   Collection Time: 11/29/14  1:23 PM  Result Value Ref Range   Sodium 141 135 - 145 mEq/L   Potassium 3.5 3.5 - 5.3 mEq/L   Chloride 105 96 - 112 mEq/L   CO2 26 19 - 32 mEq/L   Glucose, Bld 154 (H) 70 - 99 mg/dL   BUN 8 6 - 23 mg/dL   Creatinine, Ser 0.77 0.50 - 1.10 mg/dL   Total Bilirubin 0.4 0.2 - 1.2 mg/dL   Alkaline  Phosphatase 99 39 - 117 U/L   AST 28 0 - 37 U/L   ALT 10 0 - 35 U/L   Total Protein 6.5 6.0 - 8.3 g/dL   Albumin 3.5 3.5 - 5.2 g/dL   Calcium 8.6 8.4 - 10.5 mg/dL  Lactate dehydrogenase     Status: None   Collection Time: 11/29/14  1:23 PM  Result Value Ref Range   LDH 170 94 - 250 U/L  Ferritin     Status: None   Collection Time: 11/29/14  1:23 PM  Result Value Ref Range   Ferritin 30 9 - 269 ng/ml  Iron and TIBC     Status: Abnormal   Collection Time: 11/29/14  1:23 PM  Result Value Ref Range   Iron 171 (H) 41 - 142 ug/dL   TIBC 303 236 - 444 ug/dL   UIBC 132 120 - 384 ug/dL   %SAT 57 21 - 57 %  POCT Urinalysis Dipstick     Status: Abnormal   Collection Time: 12/07/14  5:32 PM  Result Value Ref Range   Color, UA yellow    Clarity, UA clear    Glucose, UA neg    Bilirubin, UA neg    Ketones, UA neg    Spec Grav, UA 1.015    Blood, UA neg    pH,  UA 6.5    Protein, UA 0.15    Urobilinogen, UA 4.0    Nitrite, UA neg    Leukocytes, UA Negative   Hemoglobin A1c     Status: Abnormal   Collection Time: 12/21/14  3:22 PM  Result Value Ref Range   Hgb A1c MFr Bld 7.7 (H) <5.7 %    Comment:                                                                        According to the ADA Clinical Practice Recommendations for 2011, when HbA1c is used as a screening test:     >=6.5%   Diagnostic of Diabetes Mellitus            (if abnormal result is confirmed)   5.7-6.4%   Increased risk of developing Diabetes Mellitus   References:Diagnosis and Classification of Diabetes Mellitus,Diabetes HKVQ,2595,63(OVFIE 1):S62-S69 and Standards of Medical Care in         Diabetes - 2011,Diabetes Care,2011,34 (Suppl 1):S11-S61.      Mean Plasma Glucose 174 (H) <117 mg/dL  Comp Met (CMET)     Status: Abnormal   Collection Time: 12/21/14  3:22 PM  Result Value Ref Range   Sodium 139 135 - 145 mEq/L   Potassium 4.4 3.5 - 5.3 mEq/L   Chloride 104 96 - 112 mEq/L   CO2 24 19 - 32 mEq/L    Glucose, Bld 171 (H) 70 - 99 mg/dL   BUN 10 6 - 23 mg/dL   Creat 0.78 0.50 - 1.10 mg/dL   Total Bilirubin 0.5 0.2 - 1.2 mg/dL   Alkaline Phosphatase 93 39 - 117 U/L   AST 23 0 - 37 U/L   ALT 10 0 - 35 U/L   Total Protein 6.9 6.0 - 8.3 g/dL   Albumin 3.4 (L) 3.5 - 5.2 g/dL   Calcium 8.8 8.4 - 10.5 mg/dL  CBC w/Diff     Status: Abnormal   Collection Time: 12/21/14  3:22 PM  Result Value Ref Range   WBC 6.5 4.0 - 10.5 K/uL   RBC 4.05 3.87 - 5.11 MIL/uL   Hemoglobin 10.1 (L) 12.0 - 15.0 g/dL   HCT 32.8 (L) 36.0 - 46.0 %   MCV 81.0 78.0 - 100.0 fL   MCH 24.9 (L) 26.0 - 34.0 pg   MCHC 30.8 30.0 - 36.0 g/dL   RDW 17.0 (H) 11.5 - 15.5 %   Platelets 111 (L) 150 - 400 K/uL   MPV 10.2 8.6 - 12.4 fL   Neutrophils Relative % 70 43 - 77 %   Neutro Abs 4.6 1.7 - 7.7 K/uL   Lymphocytes Relative 17 12 - 46 %   Lymphs Abs 1.1 0.7 - 4.0 K/uL   Monocytes Relative 9 3 - 12 %   Monocytes Absolute 0.6 0.1 - 1.0 K/uL   Eosinophils Relative 3 0 - 5 %   Eosinophils Absolute 0.2 0.0 - 0.7 K/uL   Basophils Relative 1 0 - 1 %   Basophils Absolute 0.1 0.0 - 0.1 K/uL   Smear Review Criteria for review not met   POCT urinalysis dipstick     Status: None   Collection Time: 01/11/15 12:12 PM  Result  Value Ref Range   Color, UA yellow    Clarity, UA clear    Glucose, UA neg    Bilirubin, UA neg    Ketones, UA neg    Spec Grav, UA 1.025    Blood, UA neg    pH, UA 6.0    Protein, UA positive 1+    Urobilinogen, UA 0.2    Nitrite, UA neg    Leukocytes, UA Negative     Assessment/Plan: COPD (chronic obstructive pulmonary disease) Gold B  O2 sats at 99% on room air. Examination within normal limits.  Urine dip checked due to fatigue to r/o signs of infection.  Urine dip unremarkable. Suspect fatigue still residual from recent COPD exacerbation.  Also likely allergy component. Continue current regimen.  Will begin Singulair daily. Follow-up in 2 weeks.

## 2015-01-13 NOTE — Assessment & Plan Note (Signed)
O2 sats at 99% on room air. Examination within normal limits.  Urine dip checked due to fatigue to r/o signs of infection.  Urine dip unremarkable. Suspect fatigue still residual from recent COPD exacerbation.  Also likely allergy component. Continue current regimen.  Will begin Singulair daily. Follow-up in 2 weeks.

## 2015-01-14 MED ORDER — FORMOTEROL FUMARATE 20 MCG/2ML IN NEBU: 20.0000 ug | INHALATION_SOLUTION | Freq: Two times a day (BID) | RESPIRATORY_TRACT | Status: AC

## 2015-01-14 NOTE — Telephone Encounter (Signed)
Contacted [facility] and spoke with Maudie Mercury. They did not have this prescription for the pt. I am going to resend this rx to [facility].

## 2015-01-14 NOTE — Telephone Encounter (Signed)
Triage, Perforomist rx was printed and given to [facility] to fax to [facility].  Will you please call [facility] to see if they received the rx and to check on the status of the medication?  Thank you.

## 2015-01-28 ENCOUNTER — Telehealth: Payer: Self-pay | Admitting: Family Medicine

## 2015-01-28 NOTE — Telephone Encounter (Signed)
Caller name: elizabeth Relation to pt: daughter Call back number: [phone] Pharmacy:  Reason for call:   Monique Fleming states that her administrator is requiring additional info on FMLA forms. She will be faxing forms back. Also, patient states that patients medical history and records were sent as well but that these were not requested nor needed.

## 2015-01-30 NOTE — Telephone Encounter (Signed)
Forms received and forwarded to provider.  

## 2015-01-31 NOTE — Telephone Encounter (Signed)
Completed/signed forms faxed to [facility] at [phone] successfully. Originals mailed to Lynett Grimes (pt's daughter). Copy sent for scanning. JG//CMA

## 2015-02-04 ENCOUNTER — Telehealth: Payer: Self-pay | Admitting: Critical Care Medicine

## 2015-02-04 MED ORDER — FUROSEMIDE 20 MG PO TABS: 20.0000 mg | ORAL_TABLET | Freq: Every day | ORAL | Status: DC

## 2015-02-04 NOTE — Telephone Encounter (Signed)
Please advise Dr Elsworth Soho as Dr Joya Gaskins is not available. Thanks.

## 2015-02-04 NOTE — Telephone Encounter (Signed)
Pt c/o ankle swelling x 3-4 days ago. Worsening.  Pt states that she tries to elevate her feet as much as she can during the day - not helping. Pt wants to know if her COPD could be causing this.  Pt has never experienced leg swelling before and is not taking any type of fluid pill. Upcoming appt 02/21/15 with PW in [facility].  Please advise Dr Joya Gaskins. Thanks.

## 2015-02-04 NOTE — Telephone Encounter (Signed)
COPD does not cause leg swelling unless oxygen level is low. Her oxygen level was noted to be normal during recent PCP visit Would suggest-Lasix 20 mg x 5 days and see if this has benefit- she can then discuss with Dr. Joya Gaskins during office visit

## 2015-02-04 NOTE — Telephone Encounter (Signed)
i am ok with this rec

## 2015-02-04 NOTE — Telephone Encounter (Signed)
Called pt and is aware of recs. RX sent in.

## 2015-02-05 ENCOUNTER — Encounter: Payer: Self-pay | Admitting: Critical Care Medicine

## 2015-02-07 ENCOUNTER — Encounter: Payer: Self-pay | Admitting: Medical

## 2015-02-07 ENCOUNTER — Ambulatory Visit (INDEPENDENT_AMBULATORY_CARE_PROVIDER_SITE_OTHER): Payer: Medicare Other | Admitting: Medical

## 2015-02-07 ENCOUNTER — Ambulatory Visit (HOSPITAL_BASED_OUTPATIENT_CLINIC_OR_DEPARTMENT_OTHER)
Admission: RE | Admit: 2015-02-07 | Discharge: 2015-02-07 | Disposition: A | Discharge status: Home / Self Care | Payer: Medicare Other | Source: Ambulatory Visit | Attending: Medical | Admitting: Medical

## 2015-02-07 ENCOUNTER — Other Ambulatory Visit: Payer: Self-pay

## 2015-02-07 VITALS — BP 148/61 | HR 92 | Temp 98.0°F | Ht 65.0 in | Wt 135.0 lb

## 2015-02-07 DIAGNOSIS — R0600 Dyspnea, unspecified: Secondary | ICD-10-CM

## 2015-02-07 DIAGNOSIS — M25572 Pain in left ankle and joints of left foot: Secondary | ICD-10-CM

## 2015-02-07 DIAGNOSIS — R05 Cough: Secondary | ICD-10-CM | POA: Diagnosis not present

## 2015-02-07 DIAGNOSIS — R600 Localized edema: Secondary | ICD-10-CM | POA: Diagnosis not present

## 2015-02-07 DIAGNOSIS — R0989 Other specified symptoms and signs involving the circulatory and respiratory systems: Secondary | ICD-10-CM | POA: Insufficient documentation

## 2015-02-07 DIAGNOSIS — R609 Edema, unspecified: Secondary | ICD-10-CM | POA: Diagnosis not present

## 2015-02-07 DIAGNOSIS — M79609 Pain in unspecified limb: Secondary | ICD-10-CM

## 2015-02-07 DIAGNOSIS — R82998 Other abnormal findings in urine: Secondary | ICD-10-CM

## 2015-02-07 DIAGNOSIS — N390 Urinary tract infection, site not specified: Secondary | ICD-10-CM

## 2015-02-07 DIAGNOSIS — M25579 Pain in unspecified ankle and joints of unspecified foot: Secondary | ICD-10-CM | POA: Insufficient documentation

## 2015-02-07 DIAGNOSIS — R635 Abnormal weight gain: Secondary | ICD-10-CM

## 2015-02-07 DIAGNOSIS — R2242 Localized swelling, mass and lump, left lower limb: Secondary | ICD-10-CM | POA: Diagnosis not present

## 2015-02-07 LAB — COMPREHENSIVE METABOLIC PANEL
ALT: 10 U/L (ref 0–35)
AST: 23 U/L (ref 0–37)
Albumin: 3.1 g/dL — ABNORMAL LOW (ref 3.5–5.2)
Alkaline Phosphatase: 97 U/L (ref 39–117)
BUN: 7 mg/dL (ref 6–23)
CALCIUM: 8.7 mg/dL (ref 8.4–10.5)
CHLORIDE: 110 meq/L (ref 96–112)
CO2: 29 meq/L (ref 19–32)
Creatinine, Ser: 0.84 mg/dL (ref 0.40–1.20)
GFR: 68.75 mL/min (ref >60.00)
Glucose, Bld: 163 mg/dL — ABNORMAL HIGH (ref 70–99)
Potassium: 4.6 mEq/L (ref 3.5–5.1)
SODIUM: 145 meq/L (ref 135–145)
Total Bilirubin: 0.3 mg/dL (ref 0.2–1.2)
Total Protein: 6.7 g/dL (ref 6.0–8.3)

## 2015-02-07 LAB — BRAIN NATRIURETIC PEPTIDE: Pro B Natriuretic peptide (BNP): 564 pg/mL — ABNORMAL HIGH (ref 0.0–100.0)

## 2015-02-07 MED ORDER — ONDANSETRON HCL 4 MG PO TABS: 4.0000 mg | ORAL_TABLET | Freq: Four times a day (QID) | ORAL | Status: DC | PRN

## 2015-02-07 NOTE — Progress Notes (Signed)
Pre visit review using our clinic review tool, if applicable. No additional management support is needed unless otherwise documented below in the visit note. 

## 2015-02-07 NOTE — Assessment & Plan Note (Signed)
Lt ankle xray. No indication of cellulitis presenlty. Follow xray.

## 2015-02-07 NOTE — Assessment & Plan Note (Signed)
Cxr, cmp, and bnp. Pt will not take lasix just yet until these tests are back.

## 2015-02-07 NOTE — Progress Notes (Signed)
Subjective:    Patient ID: Monique Fleming, female    DOB: 1928-11-09, 81 y.o.   MRN: [id number]  HPI  Pt last week middle of the week with lt ankle and foot swelling. Pt daughter thought minimal 1+ swelling at best. Faint knee swelling as well. Pt tried some ice and elevation to these area.  Tuesday pt felt like area got worse.  Pt felt like got worse.  Yesterday had some swelling in both feet.  Pt pulmonary MD called in lasix 20 mg a day for 5 days. But pt tid not start that. Pt will see Dr. Joya Gaskins on 19th.  Hx of uti. No symptoms. But she thought odor when she gave sample.    Review of Systems  Constitutional: Negative for fever, chills, diaphoresis, activity change and fatigue.  HENT: Negative.   Respiratory: Negative for cough, chest tightness and shortness of breath.   Cardiovascular: Negative for chest pain, palpitations and leg swelling.  Gastrointestinal: Negative for nausea, vomiting and abdominal pain.  Genitourinary: Negative for dysuria, urgency, frequency, flank pain, genital sores, pelvic pain and dyspareunia.       Odor to urine.  Musculoskeletal: Negative for neck pain and neck stiffness.       Ankle pain. Poplita lpain and pedal edema.  Neurological: Negative for dizziness, tremors, seizures, syncope, facial asymmetry, speech difficulty, weakness, light-headedness, numbness and headaches.  Psychiatric/Behavioral: Negative for behavioral problems, confusion and agitation. The patient is not nervous/anxious.     Past Medical History  Diagnosis Date  . Aortic stenosis     moderate by echo 6/11 gradient 20/83mmHg for mean and peak  . TIA (transient ischemic attack)     Dr. [name]  . Peripheral vascular disease   . IBS (irritable bowel syndrome)   . Diabetes mellitus     type II  . Chronic bronchitis   . COPD (chronic obstructive pulmonary disease)   . Atherosclerosis   . Depression   . Anxiety     Dr Casimiro Needle  . Renal cyst   . Anemia     iron deficiency    . Cellulitis   . Diverticular disease   . Infectious diarrhea(009.2)   . Sinusitis   . Thrush   . Abnormal liver function test   . GERD (gastroesophageal reflux disease)   . Insomnia     chronic  . Adenocarcinoma 2008    colon,s/p right hemicolectomy  . Angina   . Seizures     hx of one seizure "  . Cirrhosis of liver     hx of  . Clostridium difficile diarrhea   . Thrombocytopenia 04/01/2013  . Bursitis     spine per pt  . Macular degeneration 02/18/2014    Follows with [facility]  . Internal hemorrhoids   . Asthma     History   Social History  . Marital Status: Widowed    Spouse Name: N/A  . Number of Children: 3  . Years of Education: N/A   Occupational History  . retired   .     Social History Main Topics  . Smoking status: Former Smoker -- 0.50 packs/day for 70 years    Types: Cigarettes    Start date: 02/14/1951    Quit date: 01/03/2014  . Smokeless tobacco: Never Used     Comment: Still using  Electronic cigarette  . Alcohol Use: No  . Drug Use: No  . Sexual Activity: No   Other Topics Concern  . Not  on file   Social History Narrative   Patient signed a Designated Party Release to allow her daughter, Jennet Scroggin, to have access to her medical records/information. Entered by Fleet Contras March 24,2011 @ 2:47 pm   Dailly Caffeine Use    Past Surgical History  Procedure Laterality Date  . Carotid endarterectomy    . Colectomy      right hemi  . Cholecystectomy  11/09  . Upper gastrointestinal endoscopy    . Tonsillectomy    . Appendectomy      Family History  Problem Relation Age of Onset  . Colon cancer Paternal Grandmother   . Esophageal cancer Brother   . Heart disease Mother   . Diabetes Brother   . Hypertension Father   . Rectal cancer Neg Hx   . Stomach cancer Neg Hx     Allergies  Allergen Reactions  . Bactrim [Sulfamethoxazole-Trimethoprim] Shortness Of Breath  . Levofloxacin Other (See Comments)    seizure  . Pneumovax  [Pneumococcal Polysaccharide Vaccine] Other (See Comments)    Severe allergy-cellulitis  . Amoxicillin-Pot Clavulanate Other (See Comments)    Patient stated Drs. recommend taking Augmentin due to her PCN allergy.  . Penicillins Rash  . Bupropion   . Haldol [Haloperidol Lactate]     Had unknown reaction many yrs ago per pt  . Haloperidol   . Morphine   . Morphine And Related     Confused and agitation  . Pneumococcal Vaccine   . Pseudoephedrine Hcl   . Sudafed [Pseudoephedrine Hcl] Other (See Comments)    seizure    Current Outpatient Prescriptions on File Prior to Visit  Medication Sig Dispense Refill  . albuterol (PROAIR HFA) 108 (90 BASE) MCG/ACT inhaler Inhale 2 puffs into the lungs every 4 (four) hours as needed for wheezing or shortness of breath. 3 Inhaler 2  . Calcium Citrate (CITRACAL PO) Take by mouth.    . cholestyramine (QUESTRAN) 4 G packet Take 1 packet (4 g total) by mouth daily as needed (diarrhea). 180 each 1  . clonazePAM (KLONOPIN) 1 MG tablet Take 1 mg by mouth 2 (two) times daily as needed for anxiety.     . clopidogrel (PLAVIX) 75 MG tablet TAKE ONE (1) TABLET EACH DAY 90 tablet 3  . ferrous fumarate (HEMOCYTE - 106 MG FE) 325 (106 FE) MG TABS tablet Take 1 tablet (106 mg of iron total) by mouth daily. 30 each 3  . fluticasone (FLONASE) 50 MCG/ACT nasal spray Place 2 sprays into both nostrils daily as needed for allergies. 48 g 3  . Fluticasone-Salmeterol (ADVAIR) 250-50 MCG/DOSE AEPB Inhale 1 puff into the lungs 2 (two) times daily. 1 each 6  . formoterol (PERFOROMIST) 20 MCG/2ML nebulizer solution Take 2 mLs (20 mcg total) by nebulization 2 (two) times daily. 120 mL 5  . furosemide (LASIX) 20 MG tablet Take 1 tablet (20 mg total) by mouth daily. 5 tablet 0  . metFORMIN (GLUCOPHAGE) 500 MG tablet Take 1 tablet (500 mg total) by mouth daily with breakfast. 30 tablet 5  . montelukast (SINGULAIR) 10 MG tablet Take 1 tablet (10 mg total) by mouth at bedtime. 30  tablet 3  . Multiple Vitamin (MULTIVITAMIN WITH MINERALS) TABS tablet Take 1 tablet by mouth daily.    . Multiple Vitamins-Minerals (OCUVITE PRESERVISION) TABS Take by mouth 2 (two) times daily.    . ondansetron (ZOFRAN) 4 MG tablet Take 1 tablet (4 mg total) by mouth every 6 (six) hours as needed for  nausea or vomiting. 30 tablet 1  . pantoprazole (PROTONIX) 40 MG tablet Take 1 tablet (40 mg total) by mouth daily. 90 tablet 0  . phenytoin (DILANTIN) 100 MG ER capsule TAKE TWO CAPSULES BY MOUTH AT BEDTIME DAILY 60 capsule 3  . Polyethyl Glycol-Propyl Glycol (SYSTANE) 0.4-0.3 % SOLN Place 1 drop into both eyes 2 (two) times daily as needed.    . potassium chloride (K-DUR) 10 MEQ tablet Take 1 tablet (10 mEq total) by mouth 2 (two) times daily. 180 tablet 1  . saccharomyces boulardii (FLORASTOR) 250 MG capsule Take 250 mg by mouth daily.    . sodium chloride (OCEAN) 0.65 % SOLN nasal spray Place 2 sprays into both nostrils daily.    . traMADol (ULTRAM) 50 MG tablet Take 1 tablet (50 mg total) by mouth every 12 (twelve) hours as needed for moderate pain or severe pain. 60 tablet 0  . albuterol (PROVENTIL) (2.5 MG/3ML) 0.083% nebulizer solution Take 3 mLs (2.5 mg total) by nebulization every 4 (four) hours as needed for wheezing or shortness of breath. (Patient not taking: Reported on 02/07/2015) 75 mL 2   Current Facility-Administered Medications on File Prior to Visit  Medication Dose Route Frequency Provider Last Rate Last Dose  . ipratropium-albuterol (DUONEB) 0.5-2.5 (3) MG/3ML nebulizer solution 3 mL  3 mL Nebulization Q4H Brunetta Jeans, PA-C   3 mL at 01/11/15 1211    BP 148/61 mmHg  Pulse 92  Temp(Src) 98 F (36.7 C) (Oral)  Ht 5\' 5"  (1.651 m)  Wt 135 lb (61.236 kg)  BMI 22.47 kg/m2  SpO2 96%       Objective:   Physical Exam  General Mental Status- Alert. General Appearance- Not in acute distress.   Skin General: Color- Normal Color. Moisture- Normal  Moisture.  Neck Carotid Arteries- Normal color. Moisture- Normal Moisture. No carotid bruits. No JVD.  Chest and Lung Exam Auscultation: Breath Sounds:-Normal. But on inhalation mild lll pain. Even and unlabored.  Cardiovascular Auscultation:Rythm- Regular. Murmurs & Other Heart Sounds:Auscultation of the heart reveals- No Murmurs.  Abdomen Inspection:-Inspeection Normal. Palpation/Percussion:Note:No mass. Palpation and Percussion of the abdomen reveal- Non Tender, Non Distended + BS, no rebound or guarding.    Neurologic Cranial Nerve exam:- CN III-XII intact(No nystagmus), symmetric smile. Drift Test:- No drift. Romberg Exam:- Negative.  Heal to Toe Gait exam:-Normal. Finger to Nose:- Normal/Intact Strength:- 5/5 equal and symmetric strength both upper and lower extremities.  Lower ext- faint 1+ pedal edema Lt ankle- faint tenderness. Left lower ext- faint + homan sign.      Assessment & Plan:

## 2015-02-07 NOTE — Assessment & Plan Note (Signed)
Get stat doppler.

## 2015-02-07 NOTE — Patient Instructions (Signed)
Popliteal pain Get stat doppler.   Pedal edema Cxr, cmp, and bnp. Pt will not take lasix just yet until these tests are back.   Pain in joint, ankle and foot Lt ankle xray. No indication of cellulitis presenlty. Follow xray.     Follow up 5-7 days or as needed worsening or changing signs or symptoms.

## 2015-02-09 LAB — URINE CULTURE: Colony Count: 40000

## 2015-02-13 ENCOUNTER — Encounter: Payer: Self-pay | Admitting: Medical

## 2015-02-15 ENCOUNTER — Encounter: Payer: Self-pay | Admitting: Medical

## 2015-02-15 ENCOUNTER — Ambulatory Visit (INDEPENDENT_AMBULATORY_CARE_PROVIDER_SITE_OTHER): Payer: Medicare Other | Admitting: Medical

## 2015-02-15 VITALS — BP 148/70 | HR 74 | Temp 97.7°F | Ht 65.0 in | Wt 129.6 lb

## 2015-02-15 DIAGNOSIS — R82998 Other abnormal findings in urine: Secondary | ICD-10-CM

## 2015-02-15 DIAGNOSIS — N390 Urinary tract infection, site not specified: Secondary | ICD-10-CM

## 2015-02-15 DIAGNOSIS — D649 Anemia, unspecified: Secondary | ICD-10-CM | POA: Diagnosis not present

## 2015-02-15 DIAGNOSIS — R5381 Other malaise: Secondary | ICD-10-CM

## 2015-02-15 DIAGNOSIS — R5383 Other fatigue: Secondary | ICD-10-CM | POA: Diagnosis not present

## 2015-02-15 DIAGNOSIS — K921 Melena: Secondary | ICD-10-CM | POA: Diagnosis not present

## 2015-02-15 LAB — COMPREHENSIVE METABOLIC PANEL
ALT: 10 U/L (ref 0–35)
AST: 24 U/L (ref 0–37)
Albumin: 3.1 g/dL — ABNORMAL LOW (ref 3.5–5.2)
Alkaline Phosphatase: 94 U/L (ref 39–117)
BUN: 9 mg/dL (ref 6–23)
CO2: 29 mEq/L (ref 19–32)
Calcium: 7.9 mg/dL — ABNORMAL LOW (ref 8.4–10.5)
Chloride: 104 mEq/L (ref 96–112)
Creatinine, Ser: 0.74 mg/dL (ref 0.40–1.20)
GFR: 79.57 mL/min (ref >60.00)
GLUCOSE: 280 mg/dL — AB (ref 70–99)
POTASSIUM: 4.1 meq/L (ref 3.5–5.1)
Sodium: 139 mEq/L (ref 135–145)
Total Bilirubin: 0.3 mg/dL (ref 0.2–1.2)
Total Protein: 6.5 g/dL (ref 6.0–8.3)

## 2015-02-15 LAB — CBC WITH DIFFERENTIAL/PLATELET
BASOS PCT: 0.6 % (ref 0.0–3.0)
Basophils Absolute: 0 10*3/uL (ref 0.0–0.1)
EOS ABS: 0.2 10*3/uL (ref 0.0–0.7)
Eosinophils Relative: 2.5 % (ref 0.0–5.0)
HCT: 26.7 % — ABNORMAL LOW (ref 36.0–46.0)
Hemoglobin: 8.5 g/dL — ABNORMAL LOW (ref 12.0–15.0)
LYMPHS ABS: 1 10*3/uL (ref 0.7–4.0)
Lymphocytes Relative: 13.9 % (ref 12.0–46.0)
MCHC: 31.6 g/dL (ref 30.0–36.0)
MCV: 76.3 fl — AB (ref 78.0–100.0)
MONOS PCT: 7.9 % (ref 3.0–12.0)
Monocytes Absolute: 0.5 10*3/uL (ref 0.1–1.0)
NEUTROS ABS: 5.2 10*3/uL (ref 1.4–7.7)
Neutrophils Relative %: 75.1 % (ref 43.0–77.0)
Platelets: 106 10*3/uL — ABNORMAL LOW (ref 150.0–400.0)
RBC: 3.5 Mil/uL — AB (ref 3.87–5.11)
RDW: 23.6 % — ABNORMAL HIGH (ref 11.5–15.5)
WBC: 6.9 10*3/uL (ref 4.0–10.5)

## 2015-02-15 LAB — POCT URINALYSIS DIPSTICK
Bilirubin, UA: NEGATIVE
Glucose, UA: NEGATIVE
KETONES UA: NEGATIVE
Nitrite, UA: NEGATIVE
Protein, UA: 30
Spec Grav, UA: 1.01
Urobilinogen, UA: 0.2
pH, UA: 7

## 2015-02-15 MED ORDER — TRAMADOL HCL 50 MG PO TABS: 50.0000 mg | ORAL_TABLET | Freq: Two times a day (BID) | ORAL | Status: DC | PRN

## 2015-02-15 NOTE — Patient Instructions (Addendum)
Malaise and fatigue Will get cbc, cmp, ua. This got slight more prominent with lasix. So dehydration or electrolyte imbalance may play a role.      Will get culture on urine due to leukocytes in urine.  At end pt requested pain medication. Pt has history of diffuse bursitis and arthritis. She states some pain at times severe. I saw that Dr. Charlett Blake in past rx tramadol. Pt can take with no reaction. So will write. Pt does see ortho in past. She states sees ortho as last resort.  Follow up in 2 wks or as needed.

## 2015-02-15 NOTE — Assessment & Plan Note (Signed)
Will get cbc, cmp, ua. This got slight more prominent with lasix. So dehydration or electrolyte imbalance may play a role.

## 2015-02-15 NOTE — Progress Notes (Signed)
Subjective:    Patient ID: Monique Fleming, female    DOB: 07/28/1929, 81 y.o.   MRN: [id number]  HPI  Pt swelling of her ankles/lower ext are now gone. I saw pt on 02-07-2015. I did doppler, cmp, bnp and cxr.   After labs pt did take the lasix that Dr. Joya Gaskins gave her. Then she urinaetd about 4 times a night but after lasix this has resolved(No uti type symptoms). Pt then mentions remove history of kidney cyst but no back or abdomen pain now. Since using the diuretic she feels mild nausea. But then states some fatigue. But then states has been fatigued for 5 years.   Pt did mention random rare occasional transient left upper abdomen/left lower rib pain. Last for seconds when does. None presently. NO chest pain reported. No sob.   Review of Systems  Constitutional: Positive for fatigue. Negative for fever and chills.  HENT: Negative.   Respiratory: Negative for cough, chest tightness, shortness of breath and wheezing.   Cardiovascular: Negative for chest pain and palpitations.  Gastrointestinal: Negative for nausea, vomiting, abdominal pain, diarrhea, constipation, blood in stool, abdominal distention and rectal pain.  Musculoskeletal: Negative for myalgias and back pain.       Diffuse body aches at times. Some times severe.She want tx tramadol on bad days.  Neurological: Negative for dizziness, seizures, syncope, speech difficulty, weakness, light-headedness, numbness and headaches.  Hematological: Negative for adenopathy. Does not bruise/bleed easily.  Psychiatric/Behavioral: Negative for hallucinations, behavioral problems, confusion and dysphoric mood.    Past Medical History  Diagnosis Date  . Aortic stenosis     moderate by echo 6/11 gradient 20/65mmHg for mean and peak  . TIA (transient ischemic attack)     Dr. [name]  . Peripheral vascular disease   . IBS (irritable bowel syndrome)   . Diabetes mellitus     type II  . Chronic bronchitis   . COPD (chronic obstructive  pulmonary disease)   . Atherosclerosis   . Depression   . Anxiety     Dr Casimiro Needle  . Renal cyst   . Anemia     iron deficiency  . Cellulitis   . Diverticular disease   . Infectious diarrhea(009.2)   . Sinusitis   . Thrush   . Abnormal liver function test   . GERD (gastroesophageal reflux disease)   . Insomnia     chronic  . Adenocarcinoma 2008    colon,s/p right hemicolectomy  . Angina   . Seizures     hx of one seizure "  . Cirrhosis of liver     hx of  . Clostridium difficile diarrhea   . Thrombocytopenia 04/01/2013  . Bursitis     spine per pt  . Macular degeneration 02/18/2014    Follows with [facility]  . Internal hemorrhoids   . Asthma     History   Social History  . Marital Status: Widowed    Spouse Name: N/A  . Number of Children: 3  . Years of Education: N/A   Occupational History  . retired   .     Social History Main Topics  . Smoking status: Former Smoker -- 0.50 packs/day for 70 years    Types: Cigarettes    Start date: 02/14/1951    Quit date: 01/03/2014  . Smokeless tobacco: Never Used     Comment: Still using  Electronic cigarette  . Alcohol Use: No  . Drug Use: No  . Sexual Activity: No  Other Topics Concern  . Not on file   Social History Narrative   Patient signed a Designated Party Release to allow her daughter, Makalya Nave, to have access to her medical records/information. Entered by Fleet Contras March 24,2011 @ 2:47 pm   Dailly Caffeine Use    Past Surgical History  Procedure Laterality Date  . Carotid endarterectomy    . Colectomy      right hemi  . Cholecystectomy  11/09  . Upper gastrointestinal endoscopy    . Tonsillectomy    . Appendectomy      Family History  Problem Relation Age of Onset  . Colon cancer Paternal Grandmother   . Esophageal cancer Brother   . Heart disease Mother   . Diabetes Brother   . Hypertension Father   . Rectal cancer Neg Hx   . Stomach cancer Neg Hx     Allergies  Allergen  Reactions  . Bactrim [Sulfamethoxazole-Trimethoprim] Shortness Of Breath  . Levofloxacin Other (See Comments)    seizure  . Pneumovax [Pneumococcal Polysaccharide Vaccine] Other (See Comments)    Severe allergy-cellulitis  . Amoxicillin-Pot Clavulanate Other (See Comments)    Patient stated Drs. recommend taking Augmentin due to her PCN allergy.  . Penicillins Rash  . Bupropion   . Haldol [Haloperidol Lactate]     Had unknown reaction many yrs ago per pt  . Haloperidol   . Morphine   . Morphine And Related     Confused and agitation  . Pneumococcal Vaccine   . Pseudoephedrine Hcl   . Sudafed [Pseudoephedrine Hcl] Other (See Comments)    seizure    Current Outpatient Prescriptions on File Prior to Visit  Medication Sig Dispense Refill  . albuterol (PROAIR HFA) 108 (90 BASE) MCG/ACT inhaler Inhale 2 puffs into the lungs every 4 (four) hours as needed for wheezing or shortness of breath. 3 Inhaler 2  . Calcium Citrate (CITRACAL PO) Take by mouth.    . cholestyramine (QUESTRAN) 4 G packet Take 1 packet (4 g total) by mouth daily as needed (diarrhea). 180 each 1  . clonazePAM (KLONOPIN) 1 MG tablet Take 1 mg by mouth 2 (two) times daily as needed for anxiety.     . clopidogrel (PLAVIX) 75 MG tablet TAKE ONE (1) TABLET EACH DAY 90 tablet 3  . ferrous fumarate (HEMOCYTE - 106 MG FE) 325 (106 FE) MG TABS tablet Take 1 tablet (106 mg of iron total) by mouth daily. 30 each 3  . fluticasone (FLONASE) 50 MCG/ACT nasal spray Place 2 sprays into both nostrils daily as needed for allergies. 48 g 3  . Fluticasone-Salmeterol (ADVAIR) 250-50 MCG/DOSE AEPB Inhale 1 puff into the lungs 2 (two) times daily. 1 each 6  . formoterol (PERFOROMIST) 20 MCG/2ML nebulizer solution Take 2 mLs (20 mcg total) by nebulization 2 (two) times daily. 120 mL 5  . metFORMIN (GLUCOPHAGE) 500 MG tablet Take 1 tablet (500 mg total) by mouth daily with breakfast. 30 tablet 5  . Multiple Vitamin (MULTIVITAMIN WITH MINERALS)  TABS tablet Take 1 tablet by mouth daily.    . Multiple Vitamins-Minerals (OCUVITE PRESERVISION) TABS Take by mouth 2 (two) times daily.    . ondansetron (ZOFRAN) 4 MG tablet Take 1 tablet (4 mg total) by mouth every 6 (six) hours as needed for nausea or vomiting. 15 tablet 0  . pantoprazole (PROTONIX) 40 MG tablet Take 1 tablet (40 mg total) by mouth daily. 90 tablet 0  . Polyethyl Glycol-Propyl Glycol (SYSTANE) 0.4-0.3 %  SOLN Place 1 drop into both eyes 2 (two) times daily as needed.    . potassium chloride (K-DUR) 10 MEQ tablet Take 1 tablet (10 mEq total) by mouth 2 (two) times daily. 180 tablet 1  . saccharomyces boulardii (FLORASTOR) 250 MG capsule Take 250 mg by mouth daily.    . sodium chloride (OCEAN) 0.65 % SOLN nasal spray Place 2 sprays into both nostrils daily.    . traMADol (ULTRAM) 50 MG tablet Take 1 tablet (50 mg total) by mouth every 12 (twelve) hours as needed for moderate pain or severe pain. 60 tablet 0  . albuterol (PROVENTIL) (2.5 MG/3ML) 0.083% nebulizer solution Take 3 mLs (2.5 mg total) by nebulization every 4 (four) hours as needed for wheezing or shortness of breath. (Patient not taking: Reported on 02/07/2015) 75 mL 2  . furosemide (LASIX) 20 MG tablet Take 1 tablet (20 mg total) by mouth daily. 5 tablet 0  . phenytoin (DILANTIN) 100 MG ER capsule TAKE TWO CAPSULES BY MOUTH AT BEDTIME DAILY 60 capsule 3   Current Facility-Administered Medications on File Prior to Visit  Medication Dose Route Frequency Provider Last Rate Last Dose  . ipratropium-albuterol (DUONEB) 0.5-2.5 (3) MG/3ML nebulizer solution 3 mL  3 mL Nebulization Q4H Brunetta Jeans, PA-C   3 mL at 01/11/15 1211    BP 148/70 mmHg  Pulse 74  Temp(Src) 97.7 F (36.5 C) (Oral)  Ht 5\' 5"  (1.651 m)  Wt 129 lb 9.6 oz (58.786 kg)  BMI 21.57 kg/m2  SpO2 97%       Objective:   Physical Exam  General  Mental Status - Alert. General Appearance - Well groomed. Not in acute distress.  Skin Rashes- No  Rashes.  HEENT Head- Normal. Ear Auditory Canal - Left- Normal. Right - Normal.Tympanic Membrane- Left- Normal. Right- Normal. Eye Sclera/Conjunctiva- Left- Normal. Right- Normal. Nose & Sinuses Nasal Mucosa- Left-  Not oggy or Congested. Right-  Not  boggy or Congested. Mouth & Throat Lips: Upper Lip- Normal: no dryness, cracking, pallor, cyanosis, or vesicular eruption. Lower Lip-Normal: no dryness, cracking, pallor, cyanosis or vesicular eruption. Buccal Mucosa- Bilateral- No Aphthous ulcers. Oropharynx- No Discharge or Erythema. Tonsils: Characteristics- Bilateral- No Erythema or Congestion. Size/Enlargement- Bilateral- No enlargement. Discharge- bilateral-None.  Neck Neck- Supple. No Masses.   Chest and Lung Exam Auscultation: Breath Sounds:- even and unlabored.   Anterior thorax- no pain on palpation lower ribs.  Cardiovascular Auscultation:Rythm- Regular, rate and rhythm. Murmurs & Other Heart Sounds:Ausculatation of the heart reveal- No Murmurs.    Abdomen Inspection:-Inspection Normal.  Palpation/Perucssion: Palpation and Percussion of the abdomen reveal- Non Tender, No Rebound tenderness, No rigidity(Guarding) and No Palpable abdominal masses.  Liver:-Normal.  Spleen:- Normal.   Back- no cva tenderness.  Lower ext- no pedal edema at all presently.   Neurologic- CNII- XII grossly intact.        Assessment & Plan:  Hx of renal cyst. I reviewd prior ct. No abdomen or back pain. Advised pt if she has pain over kidney areas would get [place] of kidneys. Decided not to do today.

## 2015-02-15 NOTE — Progress Notes (Signed)
Pre visit review using our clinic review tool, if applicable. No additional management support is needed unless otherwise documented below in the visit note. 

## 2015-02-16 LAB — URINE CULTURE: Colony Count: 5000

## 2015-02-16 NOTE — Telephone Encounter (Signed)
I called pt at home and no answer. I called daughter [name] cell phone. Unable to leave message regarding her drop in hb/hct. Will have to get lpn to call pt of family on Monday regarding lab value and plan.

## 2015-02-18 ENCOUNTER — Encounter: Payer: Self-pay | Admitting: Medical

## 2015-02-18 ENCOUNTER — Other Ambulatory Visit (INDEPENDENT_AMBULATORY_CARE_PROVIDER_SITE_OTHER): Payer: Medicare Other

## 2015-02-18 DIAGNOSIS — D649 Anemia, unspecified: Secondary | ICD-10-CM | POA: Diagnosis not present

## 2015-02-18 DIAGNOSIS — D518 Other vitamin B12 deficiency anemias: Secondary | ICD-10-CM

## 2015-02-18 LAB — CBC WITH DIFFERENTIAL/PLATELET
BASOS ABS: 0 10*3/uL (ref 0.0–0.1)
Basophils Relative: 0.9 % (ref 0.0–3.0)
EOS ABS: 0.2 10*3/uL (ref 0.0–0.7)
Eosinophils Relative: 3.4 % (ref 0.0–5.0)
HCT: 27.1 % — ABNORMAL LOW (ref 36.0–46.0)
Hemoglobin: 8.6 g/dL — ABNORMAL LOW (ref 12.0–15.0)
LYMPHS PCT: 17.5 % (ref 12.0–46.0)
Lymphs Abs: 0.9 10*3/uL (ref 0.7–4.0)
MCHC: 31.8 g/dL (ref 30.0–36.0)
MCV: 75.2 fl — ABNORMAL LOW (ref 78.0–100.0)
Monocytes Absolute: 0.4 10*3/uL (ref 0.1–1.0)
Monocytes Relative: 8.8 % (ref 3.0–12.0)
NEUTROS PCT: 69.4 % (ref 43.0–77.0)
Neutro Abs: 3.4 10*3/uL (ref 1.4–7.7)
Platelets: 127 10*3/uL — ABNORMAL LOW (ref 150.0–400.0)
RBC: 3.6 Mil/uL — ABNORMAL LOW (ref 3.87–5.11)
RDW: 22.6 % — ABNORMAL HIGH (ref 11.5–15.5)
WBC: 5 10*3/uL (ref 4.0–10.5)

## 2015-02-18 LAB — VITAMIN B12: VITAMIN B 12: 629 pg/mL (ref 211–911)

## 2015-02-18 LAB — FOLATE: Folate: >23.3 ng/mL (ref >5.9)

## 2015-02-18 LAB — FERRITIN: FERRITIN: 16.3 ng/mL (ref 10.0–291.0)

## 2015-02-18 MED ORDER — FERROUS SULFATE 325 (65 FE) MG PO TABS: 325.0000 mg | ORAL_TABLET | Freq: Two times a day (BID) | ORAL | Status: DC

## 2015-02-18 NOTE — Telephone Encounter (Signed)
Pt anemia is stable(since Friday). Talked with [name] daughter who is nurse. Will turn in stool cards by this Thursday am. If any fatigue or worsening symptoms prior then will get repeat cbc stat. Currently stable. Repeat cbc one week from now. Anemia panel studies pending. Will try to send Dr. Henrene Pastor. Note regarding Pt recent anemia/drop in h/h  In past month.

## 2015-02-19 LAB — IRON AND TIBC
%SAT: 5 % — ABNORMAL LOW (ref 20–55)
Iron: 15 ug/dL — ABNORMAL LOW (ref 42–145)
TIBC: 283 ug/dL (ref 250–470)
UIBC: 268 ug/dL (ref 125–400)

## 2015-02-21 ENCOUNTER — Ambulatory Visit (HOSPITAL_BASED_OUTPATIENT_CLINIC_OR_DEPARTMENT_OTHER)
Admission: RE | Admit: 2015-02-21 | Discharge: 2015-02-21 | Disposition: A | Discharge status: Home / Self Care | Payer: Medicare Other | Source: Ambulatory Visit | Attending: Critical Care Medicine | Admitting: Critical Care Medicine

## 2015-02-21 ENCOUNTER — Ambulatory Visit (INDEPENDENT_AMBULATORY_CARE_PROVIDER_SITE_OTHER): Payer: Medicare Other | Admitting: Critical Care Medicine

## 2015-02-21 ENCOUNTER — Encounter: Payer: Self-pay | Admitting: Critical Care Medicine

## 2015-02-21 VITALS — BP 121/64 | HR 87 | Temp 97.8°F | Ht 65.5 in | Wt 136.0 lb

## 2015-02-21 DIAGNOSIS — R0781 Pleurodynia: Secondary | ICD-10-CM | POA: Diagnosis present

## 2015-02-21 DIAGNOSIS — R071 Chest pain on breathing: Secondary | ICD-10-CM | POA: Diagnosis not present

## 2015-02-21 DIAGNOSIS — J449 Chronic obstructive pulmonary disease, unspecified: Secondary | ICD-10-CM | POA: Diagnosis not present

## 2015-02-21 NOTE — Progress Notes (Signed)
Subjective:    Patient ID: Monique Fleming, female    DOB: [birth year], 81 y.o.   MRN: [id number]  HPI 02/21/2015 Chief Complaint  Patient presents with  . 2 month follow up    Sharp pain on bilateral sides x 3 days when coughing or taking deep breaths.  Cough with white mucus.  notes pain in L side for 3days, worse with deep breath or cough, shallow breathing. No trauma.  Cough is same, white mucus. No fever. Notes more edema in feet. Using oxygen at night   Pt denies any significant sore throat,  fever, chills, sweats, unintended weight loss,orthopnea PND,  Pt denies any increase in rescue therapy over baseline, denies waking up needing it or having any early am or nocturnal exacerbations of coughing/wheezing/or dyspnea. Pt also denies any obvious fluctuation in symptoms with  weather or environmental change or other alleviating or aggravating factors   Current Medications, Allergies, Complete Past Medical History, Past Surgical History, Family History, and Social History were reviewed in [employer] record.  Past Medical History  Diagnosis Date  . Aortic stenosis     moderate by echo 6/11 gradient 20/65mmHg for mean and peak  . TIA (transient ischemic attack)     Dr. Leonie Fleming  . Peripheral vascular disease   . IBS (irritable bowel syndrome)   . Diabetes mellitus     type II  . Chronic bronchitis   . COPD (chronic obstructive pulmonary disease)   . Atherosclerosis   . Depression   . Anxiety     Dr Monique Needle  . Renal cyst   . Anemia     iron deficiency  . Cellulitis   . Diverticular disease   . Infectious diarrhea(009.2)   . Sinusitis   . Thrush   . Abnormal liver function test   . GERD (gastroesophageal reflux disease)   . Insomnia     chronic  . Adenocarcinoma 2008    colon,s/p right hemicolectomy  . Angina   . Seizures     hx of one seizure "  . Cirrhosis of liver     hx of  . Clostridium difficile diarrhea   . Thrombocytopenia 04/01/2013    . Bursitis     spine per pt  . Macular degeneration 02/18/2014    Follows with [facility]  . Internal hemorrhoids   . Asthma      Family History  Problem Relation Age of Onset  . Colon cancer Paternal Grandmother   . Esophageal cancer Brother   . Heart disease Mother   . Diabetes Brother   . Hypertension Father   . Rectal cancer Neg Hx   . Stomach cancer Neg Hx      History   Social History  . Marital Status: Widowed    Spouse Name: N/A  . Number of Children: 3  . Years of Education: N/A   Occupational History  . retired   .     Social History Main Topics  . Smoking status: Former Smoker -- 0.50 packs/day for 70 years    Types: Cigarettes    Start date: 02/14/1951    Quit date: 01/03/2014  . Smokeless tobacco: Never Used     Comment: Still using  Electronic cigarette  . Alcohol Use: No  . Drug Use: No  . Sexual Activity: No   Other Topics Concern  . Not on file   Social History Narrative   Patient signed a Designated Party Release to allow her  daughter, Monique Fleming, to have access to her medical records/information. Entered by Fleet Contras March 24,2011 @ 2:47 pm   Dailly Caffeine Use     Allergies  Allergen Reactions  . Bactrim [Sulfamethoxazole-Trimethoprim] Shortness Of Breath  . Levofloxacin Other (See Comments)    seizure  . Pneumovax [Pneumococcal Polysaccharide Vaccine] Other (See Comments)    Severe allergy-cellulitis  . Amoxicillin-Pot Clavulanate Other (See Comments)    Patient stated Drs. recommend taking Augmentin due to her PCN allergy.  . Penicillins Rash  . Bupropion   . Haldol [Haloperidol Lactate]     Had unknown reaction many yrs ago per pt  . Haloperidol   . Morphine   . Morphine And Related     Confused and agitation  . Pneumococcal Vaccine   . Pseudoephedrine Hcl   . Sudafed [Pseudoephedrine Hcl] Other (See Comments)    seizure     Outpatient Prescriptions Prior to Visit  Medication Sig Dispense Refill  . albuterol  (PROAIR HFA) 108 (90 BASE) MCG/ACT inhaler Inhale 2 puffs into the lungs every 4 (four) hours as needed for wheezing or shortness of breath. 3 Inhaler 2  . cholestyramine (QUESTRAN) 4 G packet Take 1 packet (4 g total) by mouth daily as needed (diarrhea). 180 each 1  . clonazePAM (KLONOPIN) 1 MG tablet Take 1 mg by mouth 2 (two) times daily as needed for anxiety.     . clopidogrel (PLAVIX) 75 MG tablet TAKE ONE (1) TABLET EACH DAY 90 tablet 3  . ferrous sulfate (FERROUSUL) 325 (65 FE) MG tablet Take 1 tablet (325 mg total) by mouth 2 (two) times daily with a meal. (Patient taking differently: Take 325 mg by mouth 3 (three) times daily. ) 60 tablet 3  . fluticasone (FLONASE) 50 MCG/ACT nasal spray Place 2 sprays into both nostrils daily as needed for allergies. 48 g 3  . formoterol (PERFOROMIST) 20 MCG/2ML nebulizer solution Take 2 mLs (20 mcg total) by nebulization 2 (two) times daily. 120 mL 5  . metFORMIN (GLUCOPHAGE) 500 MG tablet Take 1 tablet (500 mg total) by mouth daily with breakfast. (Patient taking differently: Take 250 mg by mouth daily with breakfast. ) 30 tablet 5  . Multiple Vitamins-Minerals (OCUVITE PRESERVISION) TABS Take by mouth 2 (two) times daily.    . ondansetron (ZOFRAN) 4 MG tablet Take 1 tablet (4 mg total) by mouth every 6 (six) hours as needed for nausea or vomiting. 15 tablet 0  . pantoprazole (PROTONIX) 40 MG tablet Take 1 tablet (40 mg total) by mouth daily. 90 tablet 0  . phenytoin (DILANTIN) 100 MG ER capsule TAKE TWO CAPSULES BY MOUTH AT BEDTIME DAILY (Patient taking differently: one capsule twice daily) 60 capsule 3  . Polyethyl Glycol-Propyl Glycol (SYSTANE) 0.4-0.3 % SOLN Place 1 drop into both eyes 2 (two) times daily as needed.    . saccharomyces boulardii (FLORASTOR) 250 MG capsule Take 250 mg by mouth daily.    . sodium chloride (OCEAN) 0.65 % SOLN nasal spray Place 2 sprays into both nostrils daily.    . traMADol (ULTRAM) 50 MG tablet Take 1 tablet (50 mg  total) by mouth every 12 (twelve) hours as needed for moderate pain or severe pain. 30 tablet 0  . albuterol (PROVENTIL) (2.5 MG/3ML) 0.083% nebulizer solution Take 3 mLs (2.5 mg total) by nebulization every 4 (four) hours as needed for wheezing or shortness of breath. (Patient not taking: Reported on 02/21/2015) 75 mL 2  . Fluticasone-Salmeterol (ADVAIR) 250-50 MCG/DOSE AEPB  Inhale 1 puff into the lungs 2 (two) times daily. (Patient not taking: Reported on 02/21/2015) 1 each 6  . Calcium Citrate (CITRACAL PO) Take by mouth.    . ferrous fumarate (HEMOCYTE - 106 MG FE) 325 (106 FE) MG TABS tablet Take 1 tablet (106 mg of iron total) by mouth daily. 30 each 3  . furosemide (LASIX) 20 MG tablet Take 1 tablet (20 mg total) by mouth daily. (Patient not taking: Reported on 02/21/2015) 5 tablet 0  . Multiple Vitamin (MULTIVITAMIN WITH MINERALS) TABS tablet Take 1 tablet by mouth daily.    . potassium chloride (K-DUR) 10 MEQ tablet Take 1 tablet (10 mEq total) by mouth 2 (two) times daily. (Patient not taking: Reported on 02/21/2015) 180 tablet 1   Facility-Administered Medications Prior to Visit  Medication Dose Route Frequency Provider Last Rate Last Dose  . ipratropium-albuterol (DUONEB) 0.5-2.5 (3) MG/3ML nebulizer solution 3 mL  3 mL Nebulization Q4H Brunetta Jeans, PA-C   3 mL at 01/11/15 1211         Review of Systems  Constitutional: Negative for fever, chills, diaphoresis, appetite change, fatigue and unexpected weight change.  HENT: Negative for congestion, ear discharge, ear pain, hearing loss, nosebleeds, postnasal drip, rhinorrhea, sinus pressure, sneezing, sore throat, trouble swallowing and voice change.   Eyes: Negative for discharge and itching.  Respiratory: Positive for cough and shortness of breath. Negative for apnea, choking, chest tightness, wheezing and stridor.   Cardiovascular: Positive for chest pain. Negative for palpitations and leg swelling.  Gastrointestinal: Negative  for nausea, vomiting, abdominal pain and abdominal distention.  Endocrine: Negative.   Genitourinary: Negative.   Musculoskeletal: Negative for myalgias, joint swelling and arthralgias.  Skin: Negative for rash.  Allergic/Immunologic: Negative for environmental allergies.  Neurological: Negative for dizziness, syncope, weakness and headaches.  Hematological: Negative for adenopathy. Does not bruise/bleed easily.  Psychiatric/Behavioral: Negative for sleep disturbance and agitation. The patient is not nervous/anxious.        Objective:   Physical Exam  Constitutional: She is oriented to person, place, and time. She appears well-developed and well-nourished. She is active.  HENT:  Head: Normocephalic and atraumatic.  Nose: No mucosal edema, rhinorrhea, sinus tenderness, nasal deformity or septal deviation. No epistaxis. Right sinus exhibits no maxillary sinus tenderness and no frontal sinus tenderness. Left sinus exhibits no maxillary sinus tenderness and no frontal sinus tenderness.  Mouth/Throat: Oropharynx is clear and moist. No oropharyngeal exudate.  Eyes: Conjunctivae and EOM are normal. Pupils are equal, round, and reactive to light. No scleral icterus.  Neck: Trachea normal and normal range of motion. Neck supple. No JVD present. No tracheal tenderness and no muscular tenderness present. Carotid bruit is not present. No rigidity. No tracheal deviation, no edema, no erythema and normal range of motion present. No thyromegaly present.  Cardiovascular: Normal rate, regular rhythm, S1 normal, S2 normal, normal heart sounds, intact distal pulses and normal pulses.  PMI is not displaced.  Exam reveals no gallop, no S3, no S4, no distant heart sounds and no friction rub.   No murmur heard.  No systolic murmur is present   No diastolic murmur is present  Pulmonary/Chest: No accessory muscle usage or stridor. No apnea and no tachypnea. No respiratory distress. She has decreased breath sounds.  She has wheezes. She has no rhonchi. She has no rales. Chest wall is not dull to percussion. She exhibits tenderness. She exhibits no mass, no bony tenderness and no deformity.    Abdominal: Soft.  Normal appearance and bowel sounds are normal. She exhibits no distension and no ascites. There is no hepatosplenomegaly. There is no tenderness. There is no rigidity, no rebound and no guarding.  Musculoskeletal: Normal range of motion.  Lymphadenopathy:       Head (right side): No submental and no submandibular adenopathy present.       Head (left side): No submental and no submandibular adenopathy present.    She has no cervical adenopathy.  Neurological: She is alert and oriented to person, place, and time. She has normal strength. No sensory deficit.  Skin: Skin is warm and dry. No rash noted. She is not diaphoretic. No pallor. Nails show no clubbing.  Psychiatric: She has a normal mood and affect. Her speech is normal and behavior is normal.  Vitals reviewed.         Assessment & Plan:  I personally reviewed all images and lab data in the [facility] system as well as any outside material available during this office visit and agree with the  radiology impressions.  I also have reviewed any data /notes/records if available in care everywhere.  COPD (chronic obstructive pulmonary disease) with chronic bronchitis Gold B Copd with chronic obstructive bronchitis. Pedal edema not d/t lung dz Significant anemia with iron def  ?chronic blood loss likely ppt dyspnea and pedal edema Plan Cont inhaled meds No change in inhaled or maintenance medications.    Rib pain on left side Left sided rib pain Xray L rib     Ovida was seen today for 2 month follow up.  Diagnoses and all orders for this visit:  Chest pain on breathing Orders: -     DG Ribs Unilateral Left; Future  COPD (chronic obstructive pulmonary disease) with chronic bronchitis  Rib pain on left side    I ha

## 2015-02-21 NOTE — Assessment & Plan Note (Signed)
Left sided rib pain Xray L rib

## 2015-02-21 NOTE — Assessment & Plan Note (Signed)
Copd with chronic obstructive bronchitis. Pedal edema not d/t lung dz Significant anemia with iron def  ?chronic blood loss likely ppt dyspnea and pedal edema Plan Cont inhaled meds No change in inhaled or maintenance medications.

## 2015-02-21 NOTE — Patient Instructions (Signed)
No breathing med changes Xray of ribs will be made Return with tammy parrett for recheck in 6 wks

## 2015-02-22 ENCOUNTER — Encounter: Payer: Self-pay | Admitting: Medical

## 2015-02-24 NOTE — Telephone Encounter (Signed)
Note to staff to please call pt and advise to get labs/repeat for anemia.

## 2015-02-25 NOTE — Telephone Encounter (Signed)
Msg left advising Monique Fleming that labs are due and that she can call and schedule.     KP

## 2015-02-28 ENCOUNTER — Emergency Department (HOSPITAL_BASED_OUTPATIENT_CLINIC_OR_DEPARTMENT_OTHER): Payer: Medicare Other

## 2015-02-28 ENCOUNTER — Emergency Department (HOSPITAL_BASED_OUTPATIENT_CLINIC_OR_DEPARTMENT_OTHER)
Admission: EM | Admit: 2015-02-28 | Discharge: 2015-02-28 | Disposition: A | Discharge status: Home / Self Care | Payer: Medicare Other | Attending: Emergency Medicine | Admitting: Emergency Medicine

## 2015-02-28 ENCOUNTER — Other Ambulatory Visit: Payer: Medicare Other

## 2015-02-28 ENCOUNTER — Telehealth: Payer: Self-pay | Admitting: *Deleted

## 2015-02-28 ENCOUNTER — Encounter: Payer: Self-pay | Admitting: Medical

## 2015-02-28 ENCOUNTER — Encounter (HOSPITAL_BASED_OUTPATIENT_CLINIC_OR_DEPARTMENT_OTHER): Payer: Self-pay

## 2015-02-28 DIAGNOSIS — Z9049 Acquired absence of other specified parts of digestive tract: Secondary | ICD-10-CM | POA: Diagnosis not present

## 2015-02-28 DIAGNOSIS — Z7902 Long term (current) use of antithrombotics/antiplatelets: Secondary | ICD-10-CM | POA: Insufficient documentation

## 2015-02-28 DIAGNOSIS — D649 Anemia, unspecified: Secondary | ICD-10-CM

## 2015-02-28 DIAGNOSIS — Z79899 Other long term (current) drug therapy: Secondary | ICD-10-CM | POA: Insufficient documentation

## 2015-02-28 DIAGNOSIS — Z8673 Personal history of transient ischemic attack (TIA), and cerebral infarction without residual deficits: Secondary | ICD-10-CM | POA: Insufficient documentation

## 2015-02-28 DIAGNOSIS — I209 Angina pectoris, unspecified: Secondary | ICD-10-CM | POA: Insufficient documentation

## 2015-02-28 DIAGNOSIS — Z872 Personal history of diseases of the skin and subcutaneous tissue: Secondary | ICD-10-CM | POA: Diagnosis not present

## 2015-02-28 DIAGNOSIS — G40909 Epilepsy, unspecified, not intractable, without status epilepticus: Secondary | ICD-10-CM | POA: Diagnosis not present

## 2015-02-28 DIAGNOSIS — E119 Type 2 diabetes mellitus without complications: Secondary | ICD-10-CM | POA: Insufficient documentation

## 2015-02-28 DIAGNOSIS — K219 Gastro-esophageal reflux disease without esophagitis: Secondary | ICD-10-CM | POA: Diagnosis not present

## 2015-02-28 DIAGNOSIS — F329 Major depressive disorder, single episode, unspecified: Secondary | ICD-10-CM | POA: Insufficient documentation

## 2015-02-28 DIAGNOSIS — G4700 Insomnia, unspecified: Secondary | ICD-10-CM | POA: Diagnosis not present

## 2015-02-28 DIAGNOSIS — I739 Peripheral vascular disease, unspecified: Secondary | ICD-10-CM | POA: Diagnosis not present

## 2015-02-28 DIAGNOSIS — F419 Anxiety disorder, unspecified: Secondary | ICD-10-CM | POA: Diagnosis not present

## 2015-02-28 DIAGNOSIS — J449 Chronic obstructive pulmonary disease, unspecified: Secondary | ICD-10-CM | POA: Insufficient documentation

## 2015-02-28 DIAGNOSIS — Z7951 Long term (current) use of inhaled steroids: Secondary | ICD-10-CM | POA: Insufficient documentation

## 2015-02-28 DIAGNOSIS — Z87891 Personal history of nicotine dependence: Secondary | ICD-10-CM | POA: Diagnosis not present

## 2015-02-28 DIAGNOSIS — Z880 Allergy status to penicillin: Secondary | ICD-10-CM | POA: Insufficient documentation

## 2015-02-28 DIAGNOSIS — Z8619 Personal history of other infectious and parasitic diseases: Secondary | ICD-10-CM | POA: Diagnosis not present

## 2015-02-28 DIAGNOSIS — R140 Abdominal distension (gaseous): Secondary | ICD-10-CM | POA: Insufficient documentation

## 2015-02-28 DIAGNOSIS — D696 Thrombocytopenia, unspecified: Secondary | ICD-10-CM

## 2015-02-28 DIAGNOSIS — Z86018 Personal history of other benign neoplasm: Secondary | ICD-10-CM | POA: Diagnosis not present

## 2015-02-28 LAB — COMPREHENSIVE METABOLIC PANEL
ALT: 13 U/L — AB (ref 14–54)
ANION GAP: 6 (ref 5–15)
AST: 32 U/L (ref 15–41)
Albumin: 2.7 g/dL — ABNORMAL LOW (ref 3.5–5.0)
Alkaline Phosphatase: 81 U/L (ref 38–126)
BUN: 9 mg/dL (ref 6–20)
CO2: 25 mmol/L (ref 22–32)
CREATININE: 0.75 mg/dL (ref 0.44–1.00)
Calcium: 8.1 mg/dL — ABNORMAL LOW (ref 8.9–10.3)
Chloride: 110 mmol/L (ref 101–111)
GFR calc Af Amer: >60 mL/min (ref >60)
GFR calc non Af Amer: >60 mL/min (ref >60)
GLUCOSE: 207 mg/dL — AB (ref 65–99)
POTASSIUM: 3.2 mmol/L — AB (ref 3.5–5.1)
SODIUM: 141 mmol/L (ref 135–145)
Total Bilirubin: 0.5 mg/dL (ref 0.3–1.2)
Total Protein: 6.2 g/dL — ABNORMAL LOW (ref 6.5–8.1)

## 2015-02-28 LAB — CBC WITH DIFFERENTIAL/PLATELET
BASOS ABS: 0.1 10*3/uL (ref 0.0–0.1)
Basophils Relative: 1 % (ref 0–1)
Eosinophils Absolute: 0.2 10*3/uL (ref 0.0–0.7)
Eosinophils Relative: 4 % (ref 0–5)
HEMATOCRIT: 27.6 % — AB (ref 36.0–46.0)
HEMOGLOBIN: 8.1 g/dL — AB (ref 12.0–15.0)
Lymphocytes Relative: 18 % (ref 12–46)
Lymphs Abs: 1 10*3/uL (ref 0.7–4.0)
MCH: 24.4 pg — ABNORMAL LOW (ref 26.0–34.0)
MCHC: 29.3 g/dL — ABNORMAL LOW (ref 30.0–36.0)
MCV: 83.1 fL (ref 78.0–100.0)
MONOS PCT: 8 % (ref 3–12)
Monocytes Absolute: 0.4 10*3/uL (ref 0.1–1.0)
NEUTROS PCT: 69 % (ref 43–77)
Neutro Abs: 3.7 10*3/uL (ref 1.7–7.7)
Platelets: 99 10*3/uL — ABNORMAL LOW (ref 150–400)
RBC: 3.32 MIL/uL — ABNORMAL LOW (ref 3.87–5.11)
RDW: 23.3 % — ABNORMAL HIGH (ref 11.5–15.5)
WBC: 5.4 10*3/uL (ref 4.0–10.5)

## 2015-02-28 NOTE — Telephone Encounter (Signed)
Wt 139.4lb SpO2- 97 Bp 135/68 HR 108   Patient arrived in office complaining of weight gain and swelling in legs.  Noted 3 lb weight gain since previous visit one week ago.  1+ pitting edema in bilateral lower extremities.  Patient states that her weight gain occurred just in the past 2 days.  Patient is very anxious and appears short of breath, but reports that it is no worse than usual.  Notified Mackie Pai, PA who recommended patient go to ED downstairs due to history of elevated BNP and cardiomegaly- patient agreeable, escorted to ED.

## 2015-02-28 NOTE — ED Provider Notes (Signed)
CSN: [id number]     Arrival date & time 02/28/15  1442 History   First MD Initiated Contact with Patient 02/28/15 1507     Chief Complaint  Patient presents with  . Bloated     (Consider location/radiation/quality/duration/timing/severity/associated sxs/prior Treatment) HPI Comments: Patient is an 81 year old female with history of anemia, COPD, and cirrhosis of the liver. She presents today for evaluation of abdominal distention and weight gain over the past several days. She denies any abdominal pain, fevers, or chills. She denies any changes in her bowel or bladder habits area she has a history of anemia and was due to have blood work today at the [facility]. Due to her abdominal distention, she was sent here to be evaluated.  The history is provided by the patient.    Past Medical History  Diagnosis Date  . Aortic stenosis     moderate by echo 6/11 gradient 20/23mmHg for mean and peak  . TIA (transient ischemic attack)     Dr. [name]  . Peripheral vascular disease   . IBS (irritable bowel syndrome)   . Diabetes mellitus     type II  . Chronic bronchitis   . COPD (chronic obstructive pulmonary disease)   . Atherosclerosis   . Depression   . Anxiety     Dr Casimiro Needle  . Renal cyst   . Anemia     iron deficiency  . Cellulitis   . Diverticular disease   . Infectious diarrhea(009.2)   . Sinusitis   . Thrush   . Abnormal liver function test   . GERD (gastroesophageal reflux disease)   . Insomnia     chronic  . Adenocarcinoma 2008    colon,s/p right hemicolectomy  . Angina   . Seizures     hx of one seizure "  . Cirrhosis of liver     hx of  . Clostridium difficile diarrhea   . Thrombocytopenia 04/01/2013  . Bursitis     spine per pt  . Macular degeneration 02/18/2014    Follows with [facility]  . Internal hemorrhoids   . Asthma    Past Surgical History  Procedure Laterality Date  . Carotid endarterectomy    . Colectomy      right hemi  . Cholecystectomy   11/09  . Upper gastrointestinal endoscopy    . Tonsillectomy    . Appendectomy     Family History  Problem Relation Age of Onset  . Colon cancer Paternal Grandmother   . Esophageal cancer Brother   . Heart disease Mother   . Diabetes Brother   . Hypertension Father   . Rectal cancer Neg Hx   . Stomach cancer Neg Hx    History  Substance Use Topics  . Smoking status: Former Smoker -- 0.50 packs/day for 70 years    Types: Cigarettes    Start date: 02/14/1951    Quit date: 01/03/2014  . Smokeless tobacco: Never Used     Comment: Still using  Electronic cigarette  . Alcohol Use: No   OB History    No data available     Review of Systems  All other systems reviewed and are negative.     Allergies  Bactrim; Levofloxacin; Pneumovax; Amoxicillin-pot clavulanate; Penicillins; Bupropion; Haldol; Haloperidol; Morphine; Morphine and related; Pneumococcal vaccine; Pseudoephedrine hcl; and Sudafed  Home Medications   Prior to Admission medications   Medication Sig Start Date End Date Taking? Authorizing Provider  albuterol (PROAIR HFA) 108 (90 BASE) MCG/ACT inhaler  Inhale 2 puffs into the lungs every 4 (four) hours as needed for wheezing or shortness of breath. 11/12/14   Mosie Lukes, MD  albuterol (PROVENTIL) (2.5 MG/3ML) 0.083% nebulizer solution Take 3 mLs (2.5 mg total) by nebulization every 4 (four) hours as needed for wheezing or shortness of breath. Patient not taking: Reported on 02/21/2015 01/01/15   Mosie Lukes, MD  cholestyramine Lucrezia Starch) 4 G packet Take 1 packet (4 g total) by mouth daily as needed (diarrhea). 11/19/14   Mosie Lukes, MD  clonazePAM (KLONOPIN) 1 MG tablet Take 1 mg by mouth 2 (two) times daily as needed for anxiety.     Historical Provider, MD  clopidogrel (PLAVIX) 75 MG tablet TAKE ONE (1) TABLET EACH DAY 11/12/14   Mosie Lukes, MD  ferrous sulfate (FERROUSUL) 325 (65 FE) MG tablet Take 1 tablet (325 mg total) by mouth 2 (two) times daily with a  meal. Patient taking differently: Take 325 mg by mouth 3 (three) times daily.  02/18/15   Percell Miller Saguier, PA-C  fluticasone (FLONASE) 50 MCG/ACT nasal spray Place 2 sprays into both nostrils daily as needed for allergies. 11/12/14   Mosie Lukes, MD  Fluticasone-Salmeterol (ADVAIR) 250-50 MCG/DOSE AEPB Inhale 1 puff into the lungs 2 (two) times daily. Patient not taking: Reported on 02/21/2015 04/05/14   Elsie Stain, MD  formoterol (PERFOROMIST) 20 MCG/2ML nebulizer solution Take 2 mLs (20 mcg total) by nebulization 2 (two) times daily. 01/14/15   Elsie Stain, MD  metFORMIN (GLUCOPHAGE) 500 MG tablet Take 1 tablet (500 mg total) by mouth daily with breakfast. Patient taking differently: Take 250 mg by mouth daily with breakfast.  01/08/15   Mosie Lukes, MD  montelukast (SINGULAIR) 10 MG tablet Take 10 mg by mouth at bedtime.    Historical Provider, MD  Multiple Vitamins-Minerals (OCUVITE PRESERVISION) TABS Take by mouth 2 (two) times daily.    Historical Provider, MD  ondansetron (ZOFRAN) 4 MG tablet Take 1 tablet (4 mg total) by mouth every 6 (six) hours as needed for nausea or vomiting. 02/07/15   Mackie Pai, PA-C  pantoprazole (PROTONIX) 40 MG tablet Take 1 tablet (40 mg total) by mouth daily. 09/25/14   Willia Craze, NP  phenytoin (DILANTIN) 100 MG ER capsule TAKE TWO CAPSULES BY MOUTH AT BEDTIME DAILY Patient taking differently: one capsule twice daily 11/06/14   Mosie Lukes, MD  Polyethyl Glycol-Propyl Glycol (SYSTANE) 0.4-0.3 % SOLN Place 1 drop into both eyes 2 (two) times daily as needed.    Historical Provider, MD  saccharomyces boulardii (FLORASTOR) 250 MG capsule Take 250 mg by mouth daily.    Historical Provider, MD  sodium chloride (OCEAN) 0.65 % SOLN nasal spray Place 2 sprays into both nostrils daily.    Historical Provider, MD  traMADol (ULTRAM) 50 MG tablet Take 1 tablet (50 mg total) by mouth every 12 (twelve) hours as needed for moderate pain or severe pain. 02/15/15    Edward Saguier, PA-C   BP 115/99 mmHg  Pulse 102  Temp(Src) 98.7 F (37.1 C) (Oral)  Resp 20  Ht 5\' 5"  (1.651 m)  Wt 139 lb (63.05 kg)  BMI 23.13 kg/m2  SpO2 98% Physical Exam  Constitutional: She is oriented to person, place, and time. She appears well-developed and well-nourished. No distress.  HENT:  Head: Normocephalic and atraumatic.  Mouth/Throat: Oropharynx is clear and moist.  Neck: Normal range of motion. Neck supple.  Cardiovascular: Normal rate, regular rhythm and  normal heart sounds.   No murmur heard. Pulmonary/Chest: Effort normal and breath sounds normal. No respiratory distress. She has no wheezes.  Abdominal: Soft. Bowel sounds are normal. She exhibits distension. There is no tenderness.  There is mild abdominal distention. There is some tympany and possibly a fluid wave palpable. There is no tenderness to palpation.  Musculoskeletal: Normal range of motion. She exhibits no edema.  Neurological: She is alert and oriented to person, place, and time.  Skin: Skin is warm and dry. She is not diaphoretic.  Nursing note and vitals reviewed.   ED Course  Procedures (including critical care time) Labs Review Labs Reviewed  COMPREHENSIVE METABOLIC PANEL  CBC WITH DIFFERENTIAL/PLATELET    Imaging Review No results found.   EKG Interpretation None      MDM   Final diagnoses:  Abdominal distension    Patient is an 81 year old female who presents with weakness and abdominal distention. She also has a history of anemia. Her laboratory studies reveal an anemia which is consistent with her baseline and x-rays reveal no bowel obstruction but are suggestive of an ileus. She appears comfortable and I believe is appropriate for discharge. She will return if her symptoms significantly worsen or change.    Veryl Speak, MD 03/01/15 [phone]

## 2015-02-28 NOTE — Telephone Encounter (Signed)
Pt looks mild sob to me. Some pale. She states weight gain recently 3 lb by her scale. Mild bnp elevated with mild enlarged heart on last cxr.  Pt hb/hct 8.6/27.1.    I don't have room on my schedule and since she is by herself, I don't want to let her go without getting stat cbc. Make sure she has not dropped.  Pt specifically asks me not to call her daughter. So I will honor her request.   I tried calling down stairs to inform charge nurse. Ask RN to take pt down and give triage report.

## 2015-02-28 NOTE — ED Notes (Signed)
Pt with hx of anemia. Sts she needs her hgb checked. Also reports she is worried about weight gain.  Sent from PMD in building to have labs checked.

## 2015-02-28 NOTE — Discharge Instructions (Signed)
Follow-up with your primary Dr. if not improving in the next 2-3 days, and return to the ER if your symptoms significantly worsen or change.   Anemia, Nonspecific Anemia is a condition in which the concentration of red blood cells or hemoglobin in the blood is below normal. Hemoglobin is a substance in red blood cells that carries oxygen to the tissues of the body. Anemia results in not enough oxygen reaching these tissues.  CAUSES  Common causes of anemia include:   Excessive bleeding. Bleeding may be internal or external. This includes excessive bleeding from periods (in women) or from the intestine.   Poor nutrition.   Chronic kidney, thyroid, and liver disease.  Bone marrow disorders that decrease red blood cell production.  Cancer and treatments for cancer.  HIV, AIDS, and their treatments.  Spleen problems that increase red blood cell destruction.  Blood disorders.  Excess destruction of red blood cells due to infection, medicines, and autoimmune disorders. SIGNS AND SYMPTOMS   Minor weakness.   Dizziness.   Headache.  Palpitations.   Shortness of breath, especially with exercise.   Paleness.  Cold sensitivity.  Indigestion.  Nausea.  Difficulty sleeping.  Difficulty concentrating. Symptoms may occur suddenly or they may develop slowly.  DIAGNOSIS  Additional blood tests are often needed. These help your health care provider determine the best treatment. Your health care provider will check your stool for blood and look for other causes of blood loss.  TREATMENT  Treatment varies depending on the cause of the anemia. Treatment can include:   Supplements of iron, vitamin Y60, or folic acid.   Hormone medicines.   A blood transfusion. This may be needed if blood loss is severe.   Hospitalization. This may be needed if there is significant continual blood loss.   Dietary changes.  Spleen removal. HOME CARE INSTRUCTIONS Keep all follow-up  appointments. It often takes many weeks to correct anemia, and having your health care provider check on your condition and your response to treatment is very important. SEEK IMMEDIATE MEDICAL CARE IF:   You develop extreme weakness, shortness of breath, or chest pain.   You become dizzy or have trouble concentrating.  You develop heavy vaginal bleeding.   You develop a rash.   You have bloody or black, tarry stools.   You faint.   You vomit up blood.   You vomit repeatedly.   You have abdominal pain.  You have a fever or persistent symptoms for more than 2-3 days.   You have a fever and your symptoms suddenly get worse.   You are dehydrated.  MAKE SURE YOU:  Understand these instructions.  Will watch your condition.  Will get help right away if you are not doing well or get worse. Document Released: 10/29/2004 Document Revised: 05/24/2013 Document Reviewed: 03/17/2013 [facility] Patient Information 2015 [place], [place]. This information is not intended to replace advice given to you by your health care provider. Make sure you discuss any questions you have with your health care provider.    Bloating Bloating is the feeling of fullness in your belly. You may feel as though your pants are too tight. Often the cause of bloating is overeating, retaining fluids, or having gas in your bowel. It is also caused by swallowing air and eating foods that cause gas. Irritable bowel syndrome is one of the most common causes of bloating. Constipation is also a common cause. Sometimes more serious problems can cause bloating. SYMPTOMS  Usually there is a  feeling of fullness, as though your abdomen is bulged out. There may be mild discomfort.  DIAGNOSIS  Usually no particular testing is necessary for most bloating. If the condition persists and seems to become worse, your caregiver may do additional testing.  TREATMENT   There is no direct treatment for bloating.  Do not put  gas into the bowel. Avoid chewing gum and sucking on candy. These tend to make you swallow air. Swallowing air can also be a nervous habit. Try to avoid this.  Avoiding high residue diets will help. Eat foods with soluble fibers (examples include root vegetables, apples, or barley) and substitute dairy products with soy and rice products. This helps irritable bowel syndrome.  If constipation is the cause, then a high residue diet with more fiber will help.  Avoid carbonated beverages.  Over-the-counter preparations are available that help reduce gas. Your pharmacist can help you with this. SEEK MEDICAL CARE IF:   Bloating continues and seems to be getting worse.  You notice a weight gain.  You have a weight loss but the bloating is getting worse.  You have changes in your bowel habits or develop nausea or vomiting. SEEK IMMEDIATE MEDICAL CARE IF:   You develop shortness of breath or swelling in your legs.  You have an increase in abdominal pain or develop chest pain. Document Released: 07/22/2006 Document Revised: 12/14/2011 Document Reviewed: 09/09/2007 [facility] Patient Information 2015 [place], [place]. This information is not intended to replace advice given to you by your health care provider. Make sure you discuss any questions you have with your health care provider.

## 2015-03-01 LAB — POC HEMOCCULT BLD/STL (HOME/3-CARD/SCREEN)
Card #2 Fecal Occult Blod, POC: NEGATIVE
Card #3 Fecal Occult Blood, POC: NEGATIVE
Fecal Occult Blood, POC: NEGATIVE

## 2015-03-01 NOTE — Addendum Note (Signed)
Addended by: Peggyann Shoals on: 03/01/2015 03:19 PM   Modules accepted: Orders

## 2015-03-01 NOTE — Telephone Encounter (Signed)
Check with staff on referral to hematologist.

## 2015-03-01 NOTE — Telephone Encounter (Signed)
Pt anemia is mildly worse but she was already quite low(Despite iron tid). Further drop in her platelets. Will try to refer to Dr. Marin Olp by next week early.

## 2015-03-05 ENCOUNTER — Other Ambulatory Visit: Payer: Self-pay | Admitting: *Deleted

## 2015-03-05 ENCOUNTER — Telehealth: Payer: Self-pay | Admitting: Internal Medicine

## 2015-03-05 ENCOUNTER — Telehealth: Payer: Self-pay | Admitting: Family Medicine

## 2015-03-05 DIAGNOSIS — D485 Neoplasm of uncertain behavior of skin: Secondary | ICD-10-CM

## 2015-03-05 DIAGNOSIS — D509 Iron deficiency anemia, unspecified: Secondary | ICD-10-CM

## 2015-03-05 NOTE — Telephone Encounter (Signed)
Caller name: Tasheema Relation to pt: self Call back number: [phone] Pharmacy:  Reason for call:   Requesting lab results from last week.

## 2015-03-05 NOTE — Telephone Encounter (Signed)
Spoke with pts daughter and reviewed notes from PCP that state they are trying to get her in with Dr. Marin Olp. Pts daughter will follow-up with PCP.

## 2015-03-06 ENCOUNTER — Ambulatory Visit (HOSPITAL_BASED_OUTPATIENT_CLINIC_OR_DEPARTMENT_OTHER): Payer: Medicare Other

## 2015-03-06 ENCOUNTER — Ambulatory Visit (HOSPITAL_BASED_OUTPATIENT_CLINIC_OR_DEPARTMENT_OTHER): Payer: Medicare Other | Admitting: Family

## 2015-03-06 ENCOUNTER — Other Ambulatory Visit (HOSPITAL_BASED_OUTPATIENT_CLINIC_OR_DEPARTMENT_OTHER): Payer: Medicare Other

## 2015-03-06 ENCOUNTER — Encounter: Payer: Self-pay | Admitting: Family

## 2015-03-06 VITALS — BP 139/51 | HR 93 | Temp 98.2°F | Resp 22 | Ht 65.0 in | Wt 142.0 lb

## 2015-03-06 DIAGNOSIS — Z85038 Personal history of other malignant neoplasm of large intestine: Secondary | ICD-10-CM

## 2015-03-06 DIAGNOSIS — D509 Iron deficiency anemia, unspecified: Secondary | ICD-10-CM

## 2015-03-06 DIAGNOSIS — D518 Other vitamin B12 deficiency anemias: Secondary | ICD-10-CM

## 2015-03-06 DIAGNOSIS — D485 Neoplasm of uncertain behavior of skin: Secondary | ICD-10-CM

## 2015-03-06 LAB — CMP (CANCER CENTER ONLY)
ALK PHOS: 80 U/L (ref 26–84)
ALT: 15 U/L (ref 10–47)
AST: 30 U/L (ref 11–38)
Albumin: 2.5 g/dL — ABNORMAL LOW (ref 3.3–5.5)
BUN, Bld: 7 mg/dL (ref 7–22)
CHLORIDE: 100 meq/L (ref 98–108)
CO2: 28 meq/L (ref 18–33)
CREATININE: 1.1 mg/dL (ref 0.6–1.2)
Calcium: 7.9 mg/dL — ABNORMAL LOW (ref 8.0–10.3)
GLUCOSE: 268 mg/dL — AB (ref 73–118)
POTASSIUM: 3.5 meq/L (ref 3.3–4.7)
Sodium: 137 mEq/L (ref 128–145)
Total Bilirubin: 0.8 mg/dl (ref 0.20–1.60)
Total Protein: 5.6 g/dL — ABNORMAL LOW (ref 6.4–8.1)

## 2015-03-06 LAB — CBC WITH DIFFERENTIAL (CANCER CENTER ONLY)
BASO#: 0 10*3/uL (ref 0.0–0.2)
BASO%: 0.6 % (ref 0.0–2.0)
EOS%: 4.1 % (ref 0.0–7.0)
Eosinophils Absolute: 0.2 10*3/uL (ref 0.0–0.5)
HCT: 27.4 % — ABNORMAL LOW (ref 34.8–46.6)
HGB: 8.2 g/dL — ABNORMAL LOW (ref 11.6–15.9)
LYMPH#: 0.8 10*3/uL — ABNORMAL LOW (ref 0.9–3.3)
LYMPH%: 18.1 % (ref 14.0–48.0)
MCH: 26.2 pg (ref 26.0–34.0)
MCHC: 29.9 g/dL — AB (ref 32.0–36.0)
MCV: 88 fL (ref 81–101)
MONO#: 0.5 10*3/uL (ref 0.1–0.9)
MONO%: 10.3 % (ref 0.0–13.0)
NEUT%: 66.9 % (ref 39.6–80.0)
NEUTROS ABS: 3.1 10*3/uL (ref 1.5–6.5)
Platelets: 97 10*3/uL — ABNORMAL LOW (ref 145–400)
RBC: 3.13 10*6/uL — ABNORMAL LOW (ref 3.70–5.32)
RDW: 24.3 % — ABNORMAL HIGH (ref 11.1–15.7)
WBC: 4.6 10*3/uL (ref 3.9–10.0)

## 2015-03-06 LAB — LACTATE DEHYDROGENASE: LDH: 176 U/L (ref 94–250)

## 2015-03-06 MED ORDER — SODIUM CHLORIDE 0.9 % IV SOLN
INTRAVENOUS | Status: DC
  Administered 2015-03-06: 13:00:00 via INTRAVENOUS

## 2015-03-06 MED ORDER — SODIUM CHLORIDE 0.9 % IV SOLN
510.0000 mg | Freq: Once | INTRAVENOUS | Status: AC
  Administered 2015-03-06: 510 mg via INTRAVENOUS
  Filled 2015-03-06: qty 17

## 2015-03-06 NOTE — Telephone Encounter (Signed)
Called patient,given results.

## 2015-03-06 NOTE — Patient Instructions (Signed)

## 2015-03-06 NOTE — Progress Notes (Signed)
Hematology and Oncology Follow Up Visit  Monique Fleming [id number] 02/20/1929 81 y.o. 03/06/2015   Principle Diagnosis:  1. Stage I (T1 N0 M0) adenocarcinoma of the right colon. 2. Intermittent iron-deficiency anemia.  Current Therapy:   Observation    Interim History: Monique Fleming comes is here today for a follow-up. She is feeling very fatigued and weak. She has SOB without exertion and some swelling in her legs and feet. She has had some palpitations at times.  Her Hgb today is 8.2. Her iron saturation was 5% and ferritin was 16.  She has had no obvious bleeding and her hemoccult test last week was negative.  She denies fever, chills, n/v, cough, rash, headache, dizziness, chest pain, abdominal pain, constipation, diarrhea, blood in urine or stool. No swelling, tenderness, numbness or tingling in her extremities. No new aches or pains.  Her appetite is ok and she is drinking lots of fluids to stay hydrated. Her weight is stable. She has had no issues with respect to colon cancer. She is now almost 8 years out from resection.She had her resection back in May 2008. Her last colonoscopy was on 2014 by Dr. Henrene Pastor. At that time she had angiodysplastic lesions in her right colon. She has notified Dr. Henrene Pastor of her current anemia.   Medications:    Medication List       This list is accurate as of: 03/06/15 11:51 AM.  Always use your most recent med list.               albuterol 108 (90 BASE) MCG/ACT inhaler  Commonly known as:  PROAIR HFA  Inhale 2 puffs into the lungs every 4 (four) hours as needed for wheezing or shortness of breath.     CEREFOLIN NAC [id number] MG Tabs  Take by mouth.     cholestyramine 4 G packet  Commonly known as:  QUESTRAN  Take 1 packet (4 g total) by mouth daily as needed (diarrhea).     clonazePAM 1 MG tablet  Commonly known as:  KLONOPIN  Take 1 mg by mouth 2 (two) times daily as needed for anxiety.     clopidogrel 75 MG tablet  Commonly known  as:  PLAVIX  TAKE ONE (1) TABLET EACH DAY     desvenlafaxine 100 MG 24 hr tablet  Commonly known as:  PRISTIQ  Take 100 mg by mouth daily.     ferrous sulfate 325 (65 FE) MG tablet  Commonly known as:  FERROUSUL  Take 1 tablet (325 mg total) by mouth 2 (two) times daily with a meal.     fluticasone 50 MCG/ACT nasal spray  Commonly known as:  FLONASE  Place 2 sprays into both nostrils daily as needed for allergies.     Fluticasone-Salmeterol 250-50 MCG/DOSE Aepb  Commonly known as:  ADVAIR  Inhale 1 puff into the lungs 2 (two) times daily.     formoterol 20 MCG/2ML nebulizer solution  Commonly known as:  PERFOROMIST  Take 2 mLs (20 mcg total) by nebulization 2 (two) times daily.     metFORMIN 500 MG tablet  Commonly known as:  GLUCOPHAGE  Take 1 tablet (500 mg total) by mouth daily with breakfast.     montelukast 10 MG tablet  Commonly known as:  SINGULAIR  Take 10 mg by mouth at bedtime.     OCUVITE PRESERVISION Tabs  Take by mouth 2 (two) times daily.     ondansetron 4 MG tablet  Commonly known as:  ZOFRAN  Take 1 tablet (4 mg total) by mouth every 6 (six) hours as needed for nausea or vomiting.     pantoprazole 40 MG tablet  Commonly known as:  PROTONIX  Take 1 tablet (40 mg total) by mouth daily.     phenytoin 100 MG ER capsule  Commonly known as:  DILANTIN  TAKE TWO CAPSULES BY MOUTH AT BEDTIME DAILY     saccharomyces boulardii 250 MG capsule  Commonly known as:  FLORASTOR  Take 250 mg by mouth daily.     sodium chloride 0.65 % Soln nasal spray  Commonly known as:  OCEAN  Place 2 sprays into both nostrils daily.     SYSTANE 0.4-0.3 % Soln  Generic drug:  Polyethyl Glycol-Propyl Glycol  Place 1 drop into both eyes 2 (two) times daily as needed.     traMADol 50 MG tablet  Commonly known as:  ULTRAM  Take 1 tablet (50 mg total) by mouth every 12 (twelve) hours as needed for moderate pain or severe pain.        Allergies:  Allergies  Allergen  Reactions  . Bactrim [Sulfamethoxazole-Trimethoprim] Shortness Of Breath  . Levofloxacin Other (See Comments)    seizure  . Pneumovax [Pneumococcal Polysaccharide Vaccine] Other (See Comments)    Severe allergy-cellulitis  . Amoxicillin-Pot Clavulanate Other (See Comments)    Patient stated Drs. recommend taking Augmentin due to her PCN allergy.  . Penicillins Rash  . Bupropion   . Haldol [Haloperidol Lactate]     Had unknown reaction many yrs ago per pt  . Haloperidol   . Morphine   . Morphine And Related     Confused and agitation  . Pneumococcal Vaccine   . Pseudoephedrine Hcl   . Sudafed [Pseudoephedrine Hcl] Other (See Comments)    seizure    Past Medical History, Surgical history, Social history, and Family History were reviewed and updated.  Review of Systems: All other 10 point review of systems is negative.   Physical Exam:  height is 5\' 5"  (1.651 m) and weight is 142 lb (64.411 kg). Her oral temperature is 98.2 F (36.8 C). Her blood pressure is 139/51 and her pulse is 93. Her respiration is 22.   Wt Readings from Last 3 Encounters:  03/06/15 142 lb (64.411 kg)  02/28/15 139 lb (63.05 kg)  02/21/15 136 lb (61.689 kg)    Ocular: Sclerae unicteric, pupils equal, round and reactive to light Ear-nose-throat: Oropharynx clear, dentition fair Lymphatic: No cervical or supraclavicular adenopathy Lungs no rales or rhonchi, good excursion bilaterally Heart regular rate and rhythm, no murmur appreciated Abd soft, nontender, positive bowel sounds MSK no focal spinal tenderness, no joint edema Neuro: non-focal, well-oriented, appropriate affect Breasts: Deferred  Lab Results  Component Value Date   WBC 4.6 03/06/2015   HGB 8.2* 03/06/2015   HCT 27.4* 03/06/2015   MCV 88 03/06/2015   PLT 97* 03/06/2015   Lab Results  Component Value Date   FERRITIN 16.3 02/18/2015   IRON 15* 02/18/2015   TIBC 283 02/18/2015   UIBC 268 02/18/2015   IRONPCTSAT 5* 02/18/2015    Lab Results  Component Value Date   RETICCTPCT 1.7 12/23/2006   RBC 3.13* 03/06/2015   No results found for: KPAFRELGTCHN, LAMBDASER, KAPLAMBRATIO Lab Results  Component Value Date   IGA 507* 08/23/2014   No results found for: TOTALPROTELP, ALBUMINELP, A1GS, A2GS, BETS, BETA2SER, GAMS, MSPIKE, SPEI   Chemistry      Component Value Date/Time  NA 141 02/28/2015 1540   NA 141 06/15/2013 1458   K 3.2* 02/28/2015 1540   K 4.3 06/15/2013 1458   CL 110 02/28/2015 1540   CL 104 06/15/2013 1458   CO2 25 02/28/2015 1540   CO2 27 06/15/2013 1458   BUN 9 02/28/2015 1540   BUN 10 06/15/2013 1458   CREATININE 0.75 02/28/2015 1540   CREATININE 0.78 12/21/2014 1522      Component Value Date/Time   CALCIUM 8.1* 02/28/2015 1540   CALCIUM 8.9 06/15/2013 1458   ALKPHOS 81 02/28/2015 1540   ALKPHOS 74 06/27/2010 0809   AST 32 02/28/2015 1540   AST 52* 06/27/2010 0809   ALT 13* 02/28/2015 1540   ALT 26 06/27/2010 0809   BILITOT 0.5 02/28/2015 1540   BILITOT 0.70 06/27/2010 0809     Impression and Plan: Ms. Sawa is a 81 year old female with a history of stage I adenocarcinoma of the right colon with resection in May 2008. She has had no evidence of recurrence. She also has intermittent iron deficiency anemia. She is fatigued with SOB and palpitations. Her iron is low at 5% and Hgb 8.2.  We will give her a dose of Feraheme today and again in 8 days.  We will follow-up with her in 6 weeks.  She knows to call here with any questions or concerns. We can certainly see her sooner if need be.   Eliezer Bottom, NP 6/1/201611:51 AM

## 2015-03-07 ENCOUNTER — Telehealth: Payer: Self-pay | Admitting: Internal Medicine

## 2015-03-07 LAB — IRON AND TIBC CHCC
%SAT: 6 % — ABNORMAL LOW (ref 21–57)
Iron: 15 ug/dL — ABNORMAL LOW (ref 41–142)
TIBC: 240 ug/dL (ref 236–444)
UIBC: 225 ug/dL (ref 120–384)

## 2015-03-07 LAB — FERRITIN CHCC: Ferritin: 26 ng/ml (ref 9–269)

## 2015-03-07 NOTE — Telephone Encounter (Signed)
Pt needs an appt with Dr. Henrene Pastor. His first available would be July 11th. Please schedule pt for appt with Dr. Henrene Pastor.

## 2015-03-07 NOTE — Telephone Encounter (Signed)
Dr. Hulda Marin did not say the appt was "urgent". Her iron was 5 yesterday and he gave IV iron. Will give another IV iron next week. She is having +3 edema in both feet, ankles with onset about 3 weeks ago. She starting having abdominal bloating about a week ago. Her stools are negative for blood. She does complain of nausea, even after zofran. Honestly, I think Ennevear at least suspects mets to her liver. Since he discharged her from oncology service, he can't diagnose. (no diagnosis, no imaging). The iron infusion was, of course as hematology. If Dr. Henrene Pastor is able to order an abdominal ultra sound or CT before her appointment, that would be fine. Unless I've missed it, there also hasn't been a hep panel in about a year. We'd really rather avoid the colonoscopy unless necessary since she isn't due for it, and the stool was negative.

## 2015-03-08 ENCOUNTER — Ambulatory Visit: Payer: Medicare Other

## 2015-03-08 ENCOUNTER — Ambulatory Visit: Payer: Medicare Other | Admitting: Family

## 2015-03-08 ENCOUNTER — Other Ambulatory Visit: Payer: Medicare Other

## 2015-03-08 MED ORDER — FULVESTRANT 250 MG/5ML IM SOLN
INTRAMUSCULAR | Status: AC
  Filled 2015-03-08: qty 5

## 2015-03-11 ENCOUNTER — Other Ambulatory Visit: Payer: Self-pay | Admitting: Family Medicine

## 2015-03-11 ENCOUNTER — Encounter: Payer: Self-pay | Admitting: Medical

## 2015-03-11 NOTE — Telephone Encounter (Signed)
Pt scheduled to see Cecille Rubin Hvozdovic, PA-C 03/14/15@2 :30pm. Pts daughter aware of appt.

## 2015-03-12 NOTE — Telephone Encounter (Signed)
Requesting: TRAMADOL Contract  SIGNED ON 12/11/14 UDS  NO UDS GIVEN Last OV   12/07/14 Last Refill    #30 WITH 0 REFILLS ON 02/15/15  Please Advise

## 2015-03-12 NOTE — Telephone Encounter (Signed)
OK to refill Tramadol same sig, same number

## 2015-03-14 ENCOUNTER — Ambulatory Visit (INDEPENDENT_AMBULATORY_CARE_PROVIDER_SITE_OTHER): Payer: Medicare Other | Admitting: Physician Assistant

## 2015-03-14 ENCOUNTER — Encounter: Payer: Self-pay | Admitting: Physician Assistant

## 2015-03-14 ENCOUNTER — Ambulatory Visit (HOSPITAL_BASED_OUTPATIENT_CLINIC_OR_DEPARTMENT_OTHER): Payer: Medicare Other

## 2015-03-14 VITALS — BP 137/66 | HR 80 | Temp 98.2°F | Resp 18

## 2015-03-14 VITALS — BP 142/66 | HR 84 | Ht 65.0 in | Wt 138.5 lb

## 2015-03-14 DIAGNOSIS — K7460 Unspecified cirrhosis of liver: Secondary | ICD-10-CM | POA: Diagnosis not present

## 2015-03-14 DIAGNOSIS — D509 Iron deficiency anemia, unspecified: Secondary | ICD-10-CM

## 2015-03-14 DIAGNOSIS — Z7902 Long term (current) use of antithrombotics/antiplatelets: Secondary | ICD-10-CM

## 2015-03-14 DIAGNOSIS — K219 Gastro-esophageal reflux disease without esophagitis: Secondary | ICD-10-CM

## 2015-03-14 DIAGNOSIS — K3189 Other diseases of stomach and duodenum: Secondary | ICD-10-CM

## 2015-03-14 DIAGNOSIS — Z85038 Personal history of other malignant neoplasm of large intestine: Secondary | ICD-10-CM | POA: Diagnosis not present

## 2015-03-14 DIAGNOSIS — K766 Portal hypertension: Secondary | ICD-10-CM

## 2015-03-14 MED ORDER — HYDROCORTISONE ACETATE 25 MG RE SUPP: RECTAL | Status: DC

## 2015-03-14 MED ORDER — FERUMOXYTOL INJECTION 510 MG/17 ML
510.0000 mg | Freq: Once | INTRAVENOUS | Status: AC
  Administered 2015-03-14: 510 mg via INTRAVENOUS
  Filled 2015-03-14: qty 17

## 2015-03-14 MED ORDER — PANTOPRAZOLE SODIUM 40 MG PO TBEC: DELAYED_RELEASE_TABLET | ORAL | Status: DC

## 2015-03-14 MED ORDER — ONDANSETRON HCL 4 MG PO TABS: 4.0000 mg | ORAL_TABLET | Freq: Four times a day (QID) | ORAL | Status: AC | PRN

## 2015-03-14 MED ORDER — PANTOPRAZOLE SODIUM 40 MG PO TBEC: 40.0000 mg | DELAYED_RELEASE_TABLET | Freq: Every day | ORAL | Status: DC

## 2015-03-14 NOTE — Telephone Encounter (Signed)
Faxed hardcopy for Tramadol to [facility]

## 2015-03-14 NOTE — Patient Instructions (Signed)

## 2015-03-14 NOTE — Telephone Encounter (Signed)
Printed and on counter for signature. 

## 2015-03-14 NOTE — Patient Instructions (Signed)
We sent prescriptions to [facility]: 1.  Zofran 4 mg for nausea 2. 30 day prescription for Pantoprazole sodium 40 mg. 3. Anusol HC Suppositories   We are sending a 90 day prescription for the Pantoprazole sodium 40 mg to [facility].  We will call you/ daughter Benjamine Mola also  after Cecille Rubin the PA talks to Dr. Scarlette Shorts about the procedures that should be done at [facility]. We will also let you know about the Plavix medication instructions.

## 2015-03-15 ENCOUNTER — Encounter: Payer: Self-pay | Admitting: Physician Assistant

## 2015-03-15 NOTE — Progress Notes (Signed)
Patient ID: Monique Fleming, female   DOB: [birth year], 81 y.o.   MRN: [id number]     History of Present Illness: Monique Fleming is a pleasant, elderly 81 year old female known to [facility]. She has multiple significant medical problems including diabetes mellitus, COPD, cerebrovascular disease with prior stroke, aortic stenosis, hepatic cirrhosis, colon cancer status post right hemicolectomy, chronic noninfectious diarrhea, IBS, anxiety, depression, colonic AVMs, duodenal AVMs, chronic anemia, prior cholecystectomy, prior endarterectomy, on chronic Plavix therapy. She is here today with worsening anemia and weakness with associated abdominal bloating. She is accompanied by her daughter who provides much of the history. Apparently 2 weeks ago patient started complaining of generally not feeling well and feeling very weak. She was evaluated by her PCP and had blood work and her iron was found to be 15. She was started on oral iron and a few days later felt worse, so she returned to her PCPs office. She was sent to the emergency room for stat labs. She said she had swelling in her feet and was given a week's worth of Lasix with minimal relief. She had an abdominal x-ray in the emergency room due to complaints of bloating and it was nonrevealing. Her iron remains low at the time of her evaluation in the emergency room, and her family requested an appointment with Monique Fleming. She saw him last Tuesday and her iron was 5. She completed fecal occult blood cards at her PCPs office the following day and they were negative. Since that time, she has had 2 iron infusions, the second of which was earlier today, the first of which was 1 week ago. She has an appointment to see Monique. Marin Fleming in late July.  She states her appetite is okay and her weight has been stable but she complains of increased abdominal distention and bloating. Last week she had skipped several days without a bowel movement and her daughter called Monique Fleming  Friday night and was instructed to use milk of magnesia. Before the patient took this however she had 2 bowel movements on her own. She has been nauseous for several weeks. Her nausea is not affected by food. She vomited today but has not previously vomited. She states she feels full after 1 or 2 bites of food. She denies heartburn or dysphagia. She states she was advised to come for evaluation as her baseline hemoglobin is around the 10-11 range and has dipped down to 8.1. She feels weak and tires very easily. She has not seen any bright red blood per rectum or melena. Her last colonoscopy was 10/13/2012 at which time she was noted to have and angiodysplastic lesions in the right colon, the colon mucosa was otherwise normal status post right hemicolectomy. Her last EGD was 11/19/2011. She had a normal esophagus, no varices, mild portal gastropathy, otherwise normal stomach, the duodenal bulb and second portion of duodenum were without abnormalities. She was instructed to repeat endoscopy in 2 years and would like to have that scheduled.   Past Medical History  Diagnosis Date  . Aortic stenosis     moderate by echo 6/11 gradient 20/67mmHg for mean and peak  . TIA (transient ischemic attack)     Monique Fleming  . Peripheral vascular disease   . IBS (irritable bowel syndrome)   . Diabetes mellitus     type II  . Chronic bronchitis   . COPD (chronic obstructive pulmonary disease)   . Atherosclerosis   . Depression   . Anxiety  Monique Casimiro Needle  . Renal cyst   . Anemia     iron deficiency  . Cellulitis   . Diverticular disease   . Infectious diarrhea(009.2)   . Sinusitis   . Thrush   . Abnormal liver function test   . GERD (gastroesophageal reflux disease)   . Insomnia     chronic  . Adenocarcinoma 2008    colon,s/p right hemicolectomy  . Angina   . Seizures     hx of one seizure "  . Cirrhosis of liver     hx of  . Clostridium difficile diarrhea   . Thrombocytopenia 04/01/2013  . Bursitis      spine per pt  . Macular degeneration 02/18/2014    Follows with [facility]  . Internal hemorrhoids   . Asthma     Past Surgical History  Procedure Laterality Date  . Carotid endarterectomy    . Colectomy      right hemi  . Cholecystectomy  11/09  . Upper gastrointestinal endoscopy    . Tonsillectomy    . Appendectomy     Family History  Problem Relation Age of Onset  . Colon cancer Paternal Grandmother   . Esophageal cancer Brother   . Heart disease Mother   . Diabetes Brother   . Hypertension Father   . Rectal cancer Neg Hx   . Stomach cancer Neg Hx    History  Substance Use Topics  . Smoking status: Former Smoker -- 0.50 packs/day for 70 years    Types: Cigarettes    Start date: 02/14/1951    Quit date: 01/03/2014  . Smokeless tobacco: Never Used     Comment: Still using  Electronic cigarette  . Alcohol Use: No   Current Outpatient Prescriptions  Medication Sig Dispense Refill  . albuterol (PROAIR HFA) 108 (90 BASE) MCG/ACT inhaler Inhale 2 puffs into the lungs every 4 (four) hours as needed for wheezing or shortness of breath. 3 Inhaler 2  . cholestyramine (QUESTRAN) 4 G packet Take 1 packet (4 g total) by mouth daily as needed (diarrhea). 180 each 1  . clonazePAM (KLONOPIN) 1 MG tablet Take 1 mg by mouth 2 (two) times daily as needed for anxiety.     . clopidogrel (PLAVIX) 75 MG tablet TAKE ONE (1) TABLET EACH DAY 90 tablet 3  . desvenlafaxine (PRISTIQ) 100 MG 24 hr tablet Take 100 mg by mouth daily.    . fluticasone (FLONASE) 50 MCG/ACT nasal spray Place 2 sprays into both nostrils daily as needed for allergies. 48 g 3  . formoterol (PERFOROMIST) 20 MCG/2ML nebulizer solution Take 2 mLs (20 mcg total) by nebulization 2 (two) times daily. 120 mL 5  . metFORMIN (GLUCOPHAGE) 500 MG tablet Take 1 tablet (500 mg total) by mouth daily with breakfast. (Patient taking differently: Take 250 mg by mouth daily with breakfast. ) 30 tablet 5  . montelukast (SINGULAIR) 10 MG  tablet Take 10 mg by mouth at bedtime.    . Multiple Vitamins-Minerals (OCUVITE PRESERVISION) TABS Take by mouth 2 (two) times daily.    . ondansetron (ZOFRAN) 4 MG tablet Take 1 tablet (4 mg total) by mouth every 6 (six) hours as needed for nausea or vomiting. 60 tablet 0  . pantoprazole (PROTONIX) 40 MG tablet Take 1 tablet (40 mg total) by mouth daily. 30 tablet 0  . phenytoin (DILANTIN) 100 MG ER capsule TAKE TWO CAPSULES BY MOUTH AT BEDTIME DAILY (Patient taking differently: one capsule twice daily) 60 capsule 3  .  Polyethyl Glycol-Propyl Glycol (SYSTANE) 0.4-0.3 % SOLN Place 1 drop into both eyes 2 (two) times daily as needed.    . saccharomyces boulardii (FLORASTOR) 250 MG capsule Take 250 mg by mouth daily.    . sodium chloride (OCEAN) 0.65 % SOLN nasal spray Place 2 sprays into both nostrils daily.    . traMADol (ULTRAM) 50 MG tablet TAKE ONE (1) TABLET BY MOUTH EVERY 12 HOURS AS NEEDED FOR MODERATE PAIN OR SEVERE PAIN 30 tablet 0  . hydrocortisone (ANUSOL-HC) 25 MG suppository Use 1 suppository per rectum as needed. 20 suppository 1  . pantoprazole (PROTONIX) 40 MG tablet Take 1 tab by mouth every morning. 90 tablet 3   Current Facility-Administered Medications  Medication Dose Route Frequency Provider Last Rate Last Dose  . ipratropium-albuterol (DUONEB) 0.5-2.5 (3) MG/3ML nebulizer solution 3 mL  3 mL Nebulization Q4H Brunetta Jeans, PA-C   3 mL at 01/11/15 1211   Allergies  Allergen Reactions  . Bactrim [Sulfamethoxazole-Trimethoprim] Shortness Of Breath  . Levofloxacin Other (See Comments)    seizure  . Pneumovax [Pneumococcal Polysaccharide Vaccine] Other (See Comments)    Severe allergy-cellulitis  . Amoxicillin-Pot Clavulanate Other (See Comments)    Patient stated Drs. recommend taking Augmentin due to her PCN allergy.  . Penicillins Rash  . Bupropion   . Haldol [Haloperidol Lactate]     Had unknown reaction many yrs ago per pt  . Haloperidol   . Morphine   .  Morphine And Related     Confused and agitation  . Pneumococcal Vaccine   . Pseudoephedrine Hcl   . Sudafed [Pseudoephedrine Hcl] Other (See Comments)    seizure      Review of Systems: Per history of present illness.  LAB RESULTS: CBC 11/29/2014 white count 5.8, hemoglobin 10.7, hematocrit 34, platelets 116,000, MCV 84. CBC 12/21/2014 white blood count 6.5, hemoglobin 10.1, hematocrit 32.8, platelets 111,000, MCV 81 CBC 02/15/2015 white count 6.9, hemoglobin 8.5, hematocrit 26.7, platelets 106,000, MCV 76.3. CBC 02/18/2015 white count 5, hemoglobin 8.6, hematocrit 27, platelets 1 27,000, MCV 75.2. CBC 02/28/2015 white count 5.4, hemoglobin 8.1, hematocrit 27.6, platelets 99,000, MCV 83.1. CBC 03/06/2015 white count 4.6, hemoglobin 8.2, hematocrit 27.4, platelets 97, MCV 88. Iron studies 11/29/2014 iron 171, U IBC 132, TIBC 303, percent saturation 57, ferritin 30. Iron studies 02/18/2015 iron 15, U IBC 268, TIBC 283, percent saturation 5, ferritin 16.3 Iron studies 03/06/2015 iron 15, U IBC 225, TIBC 240, percent saturation 6, ferritin 26 Fecal occult blood 06/25/2014 positive, 06/29/2014 positive, 08/21/2014 negative, 03/01/2015 negative Studies:   Dg Ribs Unilateral Left  02/22/2015   CLINICAL DATA:  Left lower posterior rib pain for 2 weeks, no known injury, cough, bronchitis  EXAM: LEFT RIBS - 2 VIEW  COMPARISON:  02/07/2015  FINDINGS: Three views left ribs submitted. No left rib fracture is identified. No pneumothorax.  IMPRESSION: Negative.   Electronically Signed   By: Lahoma Crocker M.D.   On: 02/22/2015 08:01   Dg Abd Acute W/chest  02/28/2015   CLINICAL DATA:  Abdominal pain, bloating and distention.  EXAM: DG ABDOMEN ACUTE W/ 1V CHEST  COMPARISON:  Chest x-ray on 02/07/2015  FINDINGS: Stable chronic lung disease. There is no evidence of pulmonary edema, consolidation, pneumothorax, nodule or pleural fluid.  Abdominal films show gas-filled loops of small bowel which do not appear  dilated. There is some gas and stool in nondilated colon. No free air or abnormal calcifications. Clips are present related to prior cholecystectomy. Bony structures  are unremarkable.  IMPRESSION: Nondilated small bowel containing gas. This may be secondary to a mild ileus or enteritis. No overt bowel obstruction is identified.   Electronically Signed   By: Aletta Edouard M.D.   On: 02/28/2015 16:10   ENDOSCOPIES: See history of present illness   Physical Exam: General: Pleasant, frail, pale, elderly female in no acute distress Head: Normocephalic and atraumatic Eyes:  sclerae anicteric, conjunctiva pale  Ears: Normal auditory acuity Lungs: Clear throughout to auscultation Heart: Regular rate and rhythm Abdomen: Soft, non distended, non-tender. No masses, no hepatomegaly. Normal bowel sounds Rectal: Small nonthrombosed easily reducible internal hemorrhoid. Dark brown stool heme negative Musculoskeletal: Symmetrical with no gross deformities  Extremities: No edema  Neurological: Alert oriented x 4, grossly nonfocal Psychological:  Alert and cooperative. Normal mood and affect  Assessment and Recommendations: 81 year old female with multiple medical problems including aortic stenosis, chronic blood loss from AVMs, on Plavix, presenting with worsening iron deficiency anemia. Patient is to continue following with hematology for iron infusions. We will plan on repeat EGD but will review with Monique. Henrene Pastor for scheduling to see if additional evaluation in the way of capsule endoscopy/colonoscopy are warranted. We'll contact patient. In the meantime patient has asked for refills on pantoprazole, Zofran, and Anusol HC suppositories and these have been sent to her pharmacy.        Jayliana Valencia, Vita Barley PA-C 03/15/2015,

## 2015-03-15 NOTE — Progress Notes (Signed)
Patient well known to me. Office evaluation note reviewed carefully. Discussed with physician assistant. This patient has iron deficiency anemia without clinically significant GI bleeding. My first recommendation is IV iron replacement under the direction of hematology, as is being done. Next, blood transfusion if clinically indicated. Finally, I do not see any benefit to endoscopic procedures at this time. They will not change the plan or recommendations as outlined above. It will not make her feel better. Furthermore, given her comorbidities, quite high risk. Cecille Rubin, please share this information with the patient and her daughter. Continue prescribed GI medications as noted.

## 2015-03-15 NOTE — Progress Notes (Signed)
Spoke with pts daughter and she is aware of Dr. Blanch Media recommendations. OV note sent to Dr. Marin Olp.

## 2015-03-19 ENCOUNTER — Encounter: Payer: Self-pay | Admitting: Adult Health

## 2015-03-21 ENCOUNTER — Telehealth: Payer: Self-pay | Admitting: *Deleted

## 2015-03-21 NOTE — Telephone Encounter (Signed)
Disability parking placard received from pt. Form filled out as much as possible and forwarded to Dr. Charlett Blake. JG//CMA

## 2015-03-25 NOTE — Telephone Encounter (Signed)
Placard mailed to pt's home address (per pt request). Copy sent for scanning. JG//CMA

## 2015-03-28 ENCOUNTER — Ambulatory Visit: Payer: Medicare Other | Admitting: Adult Health

## 2015-03-31 ENCOUNTER — Encounter: Payer: Self-pay | Admitting: Family Medicine

## 2015-04-15 ENCOUNTER — Ambulatory Visit: Payer: Medicare Other | Admitting: Internal Medicine

## 2015-04-25 ENCOUNTER — Encounter: Payer: Self-pay | Admitting: Adult Health

## 2015-04-25 ENCOUNTER — Ambulatory Visit (HOSPITAL_BASED_OUTPATIENT_CLINIC_OR_DEPARTMENT_OTHER): Payer: Medicare Other | Admitting: Family

## 2015-04-25 ENCOUNTER — Other Ambulatory Visit (HOSPITAL_BASED_OUTPATIENT_CLINIC_OR_DEPARTMENT_OTHER): Payer: Medicare Other

## 2015-04-25 ENCOUNTER — Encounter: Payer: Self-pay | Admitting: Family

## 2015-04-25 ENCOUNTER — Ambulatory Visit (INDEPENDENT_AMBULATORY_CARE_PROVIDER_SITE_OTHER): Payer: Medicare Other | Admitting: Adult Health

## 2015-04-25 VITALS — BP 140/57 | HR 79 | Temp 97.5°F | Resp 16 | Ht 65.0 in | Wt 135.0 lb

## 2015-04-25 VITALS — BP 157/67 | HR 80 | Temp 97.9°F | Ht 65.0 in | Wt 133.0 lb

## 2015-04-25 DIAGNOSIS — Z85038 Personal history of other malignant neoplasm of large intestine: Secondary | ICD-10-CM

## 2015-04-25 DIAGNOSIS — J9611 Chronic respiratory failure with hypoxia: Secondary | ICD-10-CM

## 2015-04-25 DIAGNOSIS — D509 Iron deficiency anemia, unspecified: Secondary | ICD-10-CM | POA: Diagnosis present

## 2015-04-25 DIAGNOSIS — B001 Herpesviral vesicular dermatitis: Secondary | ICD-10-CM | POA: Insufficient documentation

## 2015-04-25 DIAGNOSIS — J449 Chronic obstructive pulmonary disease, unspecified: Secondary | ICD-10-CM | POA: Diagnosis not present

## 2015-04-25 LAB — COMPREHENSIVE METABOLIC PANEL (CC13)
ALK PHOS: 137 U/L (ref 40–150)
ALT: 17 U/L (ref 0–55)
ANION GAP: 9 meq/L (ref 3–11)
AST: 38 U/L — AB (ref 5–34)
Albumin: 3 g/dL — ABNORMAL LOW (ref 3.5–5.0)
BUN: 7.2 mg/dL (ref 7.0–26.0)
CO2: 24 mEq/L (ref 22–29)
Calcium: 7.9 mg/dL — ABNORMAL LOW (ref 8.4–10.4)
Chloride: 110 mEq/L — ABNORMAL HIGH (ref 98–109)
Creatinine: 0.8 mg/dL (ref 0.6–1.1)
EGFR: 73 mL/min/{1.73_m2} — ABNORMAL LOW (ref >90)
Glucose: 168 mg/dl — ABNORMAL HIGH (ref 70–140)
Potassium: 3.2 mEq/L — ABNORMAL LOW (ref 3.5–5.1)
Sodium: 143 mEq/L (ref 136–145)
Total Bilirubin: 0.33 mg/dL (ref 0.20–1.20)
Total Protein: 6.7 g/dL (ref 6.4–8.3)

## 2015-04-25 LAB — CBC WITH DIFFERENTIAL (CANCER CENTER ONLY)
BASO#: 0 10*3/uL (ref 0.0–0.2)
BASO%: 0.5 % (ref 0.0–2.0)
EOS%: 5 % (ref 0.0–7.0)
Eosinophils Absolute: 0.3 10*3/uL (ref 0.0–0.5)
HCT: 35.8 % (ref 34.8–46.6)
HEMOGLOBIN: 11.8 g/dL (ref 11.6–15.9)
LYMPH#: 1.2 10*3/uL (ref 0.9–3.3)
LYMPH%: 19.8 % (ref 14.0–48.0)
MCH: 28.6 pg (ref 26.0–34.0)
MCHC: 33 g/dL (ref 32.0–36.0)
MCV: 87 fL (ref 81–101)
MONO#: 0.5 10*3/uL (ref 0.1–0.9)
MONO%: 9.1 % (ref 0.0–13.0)
NEUT#: 3.8 10*3/uL (ref 1.5–6.5)
NEUT%: 65.6 % (ref 39.6–80.0)
PLATELETS: 108 10*3/uL — AB (ref 145–400)
RBC: 4.12 10*6/uL (ref 3.70–5.32)
RDW: 16.3 % — ABNORMAL HIGH (ref 11.1–15.7)
WBC: 5.8 10*3/uL (ref 3.9–10.0)

## 2015-04-25 LAB — IRON AND TIBC CHCC
%SAT: 19 % — AB (ref 21–57)
IRON: 45 ug/dL (ref 41–142)
TIBC: 231 ug/dL — ABNORMAL LOW (ref 236–444)
UIBC: 187 ug/dL (ref 120–384)

## 2015-04-25 LAB — FERRITIN CHCC: FERRITIN: 55 ng/mL (ref 9–269)

## 2015-04-25 LAB — LACTATE DEHYDROGENASE: LDH: 209 U/L (ref 94–250)

## 2015-04-25 NOTE — Assessment & Plan Note (Signed)
Cont on nocturnal o2

## 2015-04-25 NOTE — Assessment & Plan Note (Signed)
Doing well on current regimen   Plan   Continue on current regimen  Continue on Oxygen At bedtime   Follow up in 3 months and As needed

## 2015-04-25 NOTE — Progress Notes (Signed)
   Subjective:    Patient ID: Monique Fleming, female    DOB: Mar 06, 1929, 81 y.o.   MRN: [id number]  HPI 81 yo female with COPD Gold B former smoker on nocturnal O2  04/25/2015 Follow up : COPD  Pt returns for 2 month follow up for COPD  Says she is doing well overall. No flare of cough or wheezing  Feels O2 at bedtime really helps her.  Remains on Perforomist Nebs Twice daily  .  Does request refill of zovirax cream as she gets occasional cold sore.  Denies chest pain,orthopnea, edema or fever    Review of Systems Constitutional:   No  weight loss, night sweats,  Fevers, chills, fatigue, or  lassitude.  HEENT:   No headaches,  Difficulty swallowing,  Tooth/dental problems, or  Sore throat,                No sneezing, itching, ear ache, nasal congestion, post nasal drip,   CV:  No chest pain,  Orthopnea, PND, swelling in lower extremities, anasarca, dizziness, palpitations, syncope.   GI  No heartburn, indigestion, abdominal pain, nausea, vomiting, diarrhea, change in bowel habits, loss of appetite, bloody stools.   Resp:  .  No excess mucus, no productive cough,  No non-productive cough,  No coughing up of blood.  No change in color of mucus.  No wheezing.  No chest wall deformity  Skin: no rash or lesions.  GU: no dysuria, change in color of urine, no urgency or frequency.  No flank pain, no hematuria   MS:  No joint pain or swelling.  No decreased range of motion.  No back pain.  Psych:  No change in mood or affect. No depression or anxiety.  No memory loss.         Objective:   Physical Exam GEN: A/Ox3; pleasant , NAD, frail and elderly   HEENT:  [place]/AT,  EACs-clear, TMs-wnl, NOSE-clear, THROAT-clear, no lesions, no postnasal drip or exudate noted.  No blisters noted   NECK:  Supple w/ fair ROM; no JVD; normal carotid impulses w/o bruits; no thyromegaly or nodules palpated; no lymphadenopathy.  RESP  Decreased BS in bases ,  w/o, wheezes/ rales/ or rhonchi.no  accessory muscle use, no dullness to percussion  CARD:  RRR, no m/r/g  , no peripheral edema, pulses intact, no cyanosis or clubbing.  GI:   Soft & nt; nml bowel sounds; no organomegaly or masses detected.  Musco: Warm bil, no deformities or joint swelling noted.   Neuro: alert, no focal deficits noted.    Skin: Warm, no lesions or rashes         Assessment & Plan:

## 2015-04-25 NOTE — Progress Notes (Signed)
Hematology and Oncology Follow Up Visit  Monique Fleming [id number] Jan 30, 1929 81 y.o. 04/25/2015   Principle Diagnosis:  1. Stage I (T1 N0 M0) adenocarcinoma of the right colon. 2. Intermittent iron-deficiency anemia.  Current Therapy:   Observation    Interim History: Monique Fleming comes is here today with her daughter for a follow-up. She is feeling better but still has a little fatigue at times. She received 2 doses of Feraheme in June. Her CBC has come up nicely.  She has bleeding internal and external hemorrhoids so there is constantly blood in her stool. She is followed by GI and her daughter states that she is not a candidate to have them banded because of her severe COPD.  She does use Miralax 1-2 times daily to help prevent constipation and more rectal bleeding.  She is smoking again about 1 pack per week sometimes less. She is not interested in quitting.  She denies fever, chills, n/v, cough, rash, headache, dizziness, chest pain, abdominal pain, changes in bowel or bladder habits. No blood in her urine.  No swelling, tenderness, numbness or tingling in her extremities. No new aches or pains.  Her appetite is ok and she is drinking lots of fluids to stay hydrated. Her weight is stable.  Medications:    Medication List       This list is accurate as of: 04/25/15 12:10 PM.  Always use your most recent med list.               albuterol 108 (90 BASE) MCG/ACT inhaler  Commonly known as:  PROAIR HFA  Inhale 2 puffs into the lungs every 4 (four) hours as needed for wheezing or shortness of breath.     cholestyramine 4 G packet  Commonly known as:  QUESTRAN  Take 1 packet (4 g total) by mouth daily as needed (diarrhea).     clonazePAM 1 MG tablet  Commonly known as:  KLONOPIN  Take 1 mg by mouth 2 (two) times daily as needed for anxiety.     clopidogrel 75 MG tablet  Commonly known as:  PLAVIX  TAKE ONE (1) TABLET EACH DAY     desvenlafaxine 100 MG 24 hr tablet    Commonly known as:  PRISTIQ  Take 100 mg by mouth daily.     fluticasone 50 MCG/ACT nasal spray  Commonly known as:  FLONASE  Place 2 sprays into both nostrils daily as needed for allergies.     formoterol 20 MCG/2ML nebulizer solution  Commonly known as:  PERFOROMIST  Take 2 mLs (20 mcg total) by nebulization 2 (two) times daily.     hydrocortisone 25 MG suppository  Commonly known as:  ANUSOL-HC  Use 1 suppository per rectum as needed.     metFORMIN 500 MG tablet  Commonly known as:  GLUCOPHAGE  Take 1 tablet (500 mg total) by mouth daily with breakfast.     mirtazapine 30 MG tablet  Commonly known as:  REMERON  Take 30 mg by mouth at bedtime. 1/2 tablet as needed     montelukast 10 MG tablet  Commonly known as:  SINGULAIR  Take 10 mg by mouth at bedtime.     OCUVITE PRESERVISION Tabs  Take by mouth 2 (two) times daily.     ondansetron 4 MG tablet  Commonly known as:  ZOFRAN  Take 1 tablet (4 mg total) by mouth every 6 (six) hours as needed for nausea or vomiting.     pantoprazole 40 MG  tablet  Commonly known as:  PROTONIX  Take 1 tab by mouth every morning.     phenytoin 100 MG ER capsule  Commonly known as:  DILANTIN  TAKE TWO CAPSULES BY MOUTH AT BEDTIME DAILY     saccharomyces boulardii 250 MG capsule  Commonly known as:  FLORASTOR  Take 250 mg by mouth daily.     sodium chloride 0.65 % Soln nasal spray  Commonly known as:  OCEAN  Place 2 sprays into both nostrils daily.     SYSTANE 0.4-0.3 % Soln  Generic drug:  Polyethyl Glycol-Propyl Glycol  Place 1 drop into both eyes 2 (two) times daily as needed.     traMADol 50 MG tablet  Commonly known as:  ULTRAM  TAKE ONE (1) TABLET BY MOUTH EVERY 12 HOURS AS NEEDED FOR MODERATE PAIN OR SEVERE PAIN        Allergies:  Allergies  Allergen Reactions  . Bactrim [Sulfamethoxazole-Trimethoprim] Shortness Of Breath  . Levofloxacin Other (See Comments)    seizure  . Pneumovax [Pneumococcal Polysaccharide  Vaccine] Other (See Comments)    Severe allergy-cellulitis  . Amoxicillin-Pot Clavulanate Other (See Comments)    Patient stated Drs. recommend taking Augmentin due to her PCN allergy.  . Penicillins Rash  . Bupropion   . Haldol [Haloperidol Lactate]     Had unknown reaction many yrs ago per pt  . Haloperidol   . Morphine   . Morphine And Related     Confused and agitation  . Pneumococcal Vaccine   . Pseudoephedrine Hcl   . Sudafed [Pseudoephedrine Hcl] Other (See Comments)    seizure    Past Medical History, Surgical history, Social history, and Family History were reviewed and updated.  Review of Systems: All other 10 point review of systems is negative.   Physical Exam:  height is 5\' 5"  (1.651 m) and weight is 135 lb (61.236 kg). Her oral temperature is 97.5 F (36.4 C). Her blood pressure is 140/57 and her pulse is 79. Her respiration is 16.   Wt Readings from Last 3 Encounters:  04/25/15 135 lb (61.236 kg)  04/25/15 133 lb (60.328 kg)  03/14/15 138 lb 8 oz (62.823 kg)    Ocular: Sclerae unicteric, pupils equal, round and reactive to light Ear-nose-throat: Oropharynx clear, dentition fair Lymphatic: No cervical or supraclavicular adenopathy Lungs no rales or rhonchi, good excursion bilaterally Heart regular rate and rhythm, no murmur appreciated Abd soft, nontender, positive bowel sounds MSK no focal spinal tenderness, no joint edema Neuro: non-focal, well-oriented, appropriate affect Breasts: Deferred  Lab Results  Component Value Date   WBC 5.8 04/25/2015   HGB 11.8 04/25/2015   HCT 35.8 04/25/2015   MCV 87 04/25/2015   PLT 108* 04/25/2015   Lab Results  Component Value Date   FERRITIN 26 03/06/2015   IRON 15* 03/06/2015   TIBC 240 03/06/2015   UIBC 225 03/06/2015   IRONPCTSAT 6* 03/06/2015   Lab Results  Component Value Date   RETICCTPCT 1.7 12/23/2006   RBC 4.12 04/25/2015   No results found for: KPAFRELGTCHN, LAMBDASER, KAPLAMBRATIO Lab  Results  Component Value Date   IGA 507* 08/23/2014   No results found for: Ronnald Ramp, A1GS, A2GS, Violet Baldy, MSPIKE, SPEI   Chemistry      Component Value Date/Time   NA 137 03/06/2015 1113   NA 141 02/28/2015 1540   K 3.5 03/06/2015 1113   K 3.2* 02/28/2015 1540   CL 100 03/06/2015 1113  CL 110 02/28/2015 1540   CO2 28 03/06/2015 1113   CO2 25 02/28/2015 1540   BUN 7 03/06/2015 1113   BUN 9 02/28/2015 1540   CREATININE 1.1 03/06/2015 1113   CREATININE 0.75 02/28/2015 1540      Component Value Date/Time   CALCIUM 7.9* 03/06/2015 1113   CALCIUM 8.1* 02/28/2015 1540   ALKPHOS 80 03/06/2015 1113   ALKPHOS 81 02/28/2015 1540   AST 30 03/06/2015 1113   AST 32 02/28/2015 1540   ALT 15 03/06/2015 1113   ALT 13* 02/28/2015 1540   BILITOT 0.80 03/06/2015 1113   BILITOT 0.5 02/28/2015 1540     Impression and Plan: Ms. Dimitrov is a 81 year old female with a history of stage I adenocarcinoma of the right colon with resection in May 2008. So far there has been no evidence of recurrence.  She has intermittent iron deficiency anemia due to bleeding internal and external hemorrhoids. She still has some mild fatigue but is feeling better. Her CBC has responded nicely to the 2 doses of Feraheme she received in June. We will see what her iron studies show.   We will plan to see her back in 3 months for labs and follow-up.  She knows to call here with any questions or concerns. We can certainly see her sooner if need be.   Eliezer Bottom, NP 7/21/201612:10 PM

## 2015-04-25 NOTE — Patient Instructions (Signed)
Continue on current regimen  Continue on Oxygen At bedtime   Zovirax cream to lips As needed   Follow up in 3 months and As needed

## 2015-04-25 NOTE — Assessment & Plan Note (Signed)
Zovirax rx given  follow up As needed

## 2015-05-21 ENCOUNTER — Encounter: Payer: Self-pay | Admitting: Adult Health

## 2015-05-21 NOTE — Telephone Encounter (Signed)
Per pt email: Good Monring Tammy, this is Arnita's daughter Benjamine Mola. [place] and need to get an order for a portable battery operated O2 and cylinders. She is only on 2L at night, but we'll be gone 3 days, so better safe then sorry.   Please let me know if we need to do anything on this end.   Thanks for your assistance.Marland KitchenMarland KitchenMarland KitchenElizabeth    Please advise PW thanks

## 2015-05-22 ENCOUNTER — Other Ambulatory Visit: Payer: Self-pay | Admitting: Family Medicine

## 2015-05-22 NOTE — Telephone Encounter (Signed)
LOV- 12/07/14 Lab- 04/25/15 Klor-Con 10 MEG twice daily 180 Rf:0

## 2015-05-24 ENCOUNTER — Encounter: Payer: Self-pay | Admitting: Family Medicine

## 2015-05-25 ENCOUNTER — Emergency Department (HOSPITAL_BASED_OUTPATIENT_CLINIC_OR_DEPARTMENT_OTHER): Payer: Medicare Other

## 2015-05-25 ENCOUNTER — Emergency Department (HOSPITAL_BASED_OUTPATIENT_CLINIC_OR_DEPARTMENT_OTHER)
Admission: EM | Admit: 2015-05-25 | Discharge: 2015-05-25 | Disposition: A | Discharge status: Home / Self Care | Payer: Medicare Other | Attending: Emergency Medicine | Admitting: Emergency Medicine

## 2015-05-25 DIAGNOSIS — Z87448 Personal history of other diseases of urinary system: Secondary | ICD-10-CM | POA: Diagnosis not present

## 2015-05-25 DIAGNOSIS — Z880 Allergy status to penicillin: Secondary | ICD-10-CM | POA: Diagnosis not present

## 2015-05-25 DIAGNOSIS — Z79899 Other long term (current) drug therapy: Secondary | ICD-10-CM | POA: Insufficient documentation

## 2015-05-25 DIAGNOSIS — K219 Gastro-esophageal reflux disease without esophagitis: Secondary | ICD-10-CM | POA: Insufficient documentation

## 2015-05-25 DIAGNOSIS — Z862 Personal history of diseases of the blood and blood-forming organs and certain disorders involving the immune mechanism: Secondary | ICD-10-CM | POA: Diagnosis not present

## 2015-05-25 DIAGNOSIS — G43909 Migraine, unspecified, not intractable, without status migrainosus: Secondary | ICD-10-CM | POA: Insufficient documentation

## 2015-05-25 DIAGNOSIS — J449 Chronic obstructive pulmonary disease, unspecified: Secondary | ICD-10-CM | POA: Diagnosis not present

## 2015-05-25 DIAGNOSIS — Z7951 Long term (current) use of inhaled steroids: Secondary | ICD-10-CM | POA: Diagnosis not present

## 2015-05-25 DIAGNOSIS — R531 Weakness: Secondary | ICD-10-CM | POA: Insufficient documentation

## 2015-05-25 DIAGNOSIS — F329 Major depressive disorder, single episode, unspecified: Secondary | ICD-10-CM | POA: Diagnosis not present

## 2015-05-25 DIAGNOSIS — Z87891 Personal history of nicotine dependence: Secondary | ICD-10-CM | POA: Insufficient documentation

## 2015-05-25 DIAGNOSIS — Z8673 Personal history of transient ischemic attack (TIA), and cerebral infarction without residual deficits: Secondary | ICD-10-CM | POA: Diagnosis not present

## 2015-05-25 DIAGNOSIS — Z872 Personal history of diseases of the skin and subcutaneous tissue: Secondary | ICD-10-CM | POA: Diagnosis not present

## 2015-05-25 DIAGNOSIS — F419 Anxiety disorder, unspecified: Secondary | ICD-10-CM | POA: Diagnosis not present

## 2015-05-25 DIAGNOSIS — Z8619 Personal history of other infectious and parasitic diseases: Secondary | ICD-10-CM | POA: Diagnosis not present

## 2015-05-25 DIAGNOSIS — Z8679 Personal history of other diseases of the circulatory system: Secondary | ICD-10-CM | POA: Diagnosis not present

## 2015-05-25 DIAGNOSIS — E119 Type 2 diabetes mellitus without complications: Secondary | ICD-10-CM | POA: Insufficient documentation

## 2015-05-25 LAB — COMPREHENSIVE METABOLIC PANEL
ALBUMIN: 3 g/dL — AB (ref 3.5–5.0)
ALT: 14 U/L (ref 14–54)
ANION GAP: 5 (ref 5–15)
AST: 42 U/L — AB (ref 15–41)
Alkaline Phosphatase: 103 U/L (ref 38–126)
BILIRUBIN TOTAL: 0.6 mg/dL (ref 0.3–1.2)
BUN: 8 mg/dL (ref 6–20)
CO2: 28 mmol/L (ref 22–32)
Calcium: 8.2 mg/dL — ABNORMAL LOW (ref 8.9–10.3)
Chloride: 109 mmol/L (ref 101–111)
Creatinine, Ser: 0.63 mg/dL (ref 0.44–1.00)
GFR calc Af Amer: >60 mL/min (ref >60)
GFR calc non Af Amer: >60 mL/min (ref >60)
GLUCOSE: 122 mg/dL — AB (ref 65–99)
POTASSIUM: 3.7 mmol/L (ref 3.5–5.1)
SODIUM: 142 mmol/L (ref 135–145)
Total Protein: 6.6 g/dL (ref 6.5–8.1)

## 2015-05-25 LAB — BRAIN NATRIURETIC PEPTIDE: B Natriuretic Peptide: 630.3 pg/mL — ABNORMAL HIGH (ref 0.0–100.0)

## 2015-05-25 LAB — CBC WITH DIFFERENTIAL/PLATELET
Basophils Absolute: 0 10*3/uL (ref 0.0–0.1)
Basophils Relative: 1 % (ref 0–1)
EOS PCT: 5 % (ref 0–5)
Eosinophils Absolute: 0.3 10*3/uL (ref 0.0–0.7)
HEMATOCRIT: 30.8 % — AB (ref 36.0–46.0)
Hemoglobin: 9.9 g/dL — ABNORMAL LOW (ref 12.0–15.0)
LYMPHS ABS: 1 10*3/uL (ref 0.7–4.0)
LYMPHS PCT: 18 % (ref 12–46)
MCH: 28 pg (ref 26.0–34.0)
MCHC: 32.1 g/dL (ref 30.0–36.0)
MCV: 87 fL (ref 78.0–100.0)
MONO ABS: 0.6 10*3/uL (ref 0.1–1.0)
MONOS PCT: 11 % (ref 3–12)
NEUTROS ABS: 3.6 10*3/uL (ref 1.7–7.7)
Neutrophils Relative %: 66 % (ref 43–77)
PLATELETS: 116 10*3/uL — AB (ref 150–400)
RBC: 3.54 MIL/uL — ABNORMAL LOW (ref 3.87–5.11)
RDW: 15.4 % (ref 11.5–15.5)
WBC: 5.4 10*3/uL (ref 4.0–10.5)

## 2015-05-25 LAB — URINALYSIS, ROUTINE W REFLEX MICROSCOPIC
Bilirubin Urine: NEGATIVE
GLUCOSE, UA: NEGATIVE mg/dL
HGB URINE DIPSTICK: NEGATIVE
KETONES UR: NEGATIVE mg/dL
Leukocytes, UA: NEGATIVE
Nitrite: NEGATIVE
PH: 7 (ref 5.0–8.0)
PROTEIN: NEGATIVE mg/dL
Specific Gravity, Urine: 1.014 (ref 1.005–1.030)
Urobilinogen, UA: 1 mg/dL (ref 0.0–1.0)

## 2015-05-25 LAB — TROPONIN I: Troponin I: 0.03 ng/mL (ref <0.031)

## 2015-05-25 NOTE — ED Provider Notes (Signed)
CSN: [id number]     Arrival date & time 05/25/15  1347 History  This chart was scribed for Monique Speak, Fleming by Helane Gunther, ED Scribe. This patient was seen in room MH12/MH12 and the patient's care was started at 6:18 PM.    Chief Complaint  Patient presents with  . Weakness   Patient is a 81 y.o. female presenting with weakness. The history is provided by the patient and a relative. No language interpreter was used.  Weakness This is a recurrent problem. The current episode started 12 to 24 hours ago. The problem occurs constantly. The problem has not changed since onset.Associated symptoms include headaches. Pertinent negatives include no abdominal pain.   HPI Comments: Monique Fleming is a 81 y.o. female with a PMHx of anemia who presents to the Emergency Department complaining of general weakness onset this morning. She notes that she felt weak to the back, knees, and neck. She reports associated dizziness, lightheadedness, cough, chills (resolved after a few hours), swelling to the feet and HA all of which began 1 day ago. Per daughter her HA felt like the worst one she has ever had, and pt was acting a little confused this morning. Her last transfusion of iron was in July of this year. She is scheduled to see her PCP again in October. Her daughter is concerned that pt's iron has dropped again. She has a PMHx of chronic UTIs, colon cancer as well as hemorrhoids. She has had a PMHx of TIA which resolved by the time she arrived at the ED. Pt denies abdominal pain, bloody stool or melena.  Past Medical History  Diagnosis Date  . Aortic stenosis     moderate by echo 6/11 gradient 20/63mmHg for mean and peak  . TIA (transient ischemic attack)     Dr. [name]  . Peripheral vascular disease   . IBS (irritable bowel syndrome)   . Diabetes mellitus     type II  . Chronic bronchitis   . COPD (chronic obstructive pulmonary disease)   . Atherosclerosis   . Depression   . Anxiety     Dr Casimiro Needle   . Renal cyst   . Anemia     iron deficiency  . Cellulitis   . Diverticular disease   . Infectious diarrhea(009.2)   . Sinusitis   . Thrush   . Abnormal liver function test   . GERD (gastroesophageal reflux disease)   . Insomnia     chronic  . Adenocarcinoma 2008    colon,s/p right hemicolectomy  . Angina   . Seizures     hx of one seizure "  . Cirrhosis of liver     hx of  . Clostridium difficile diarrhea   . Thrombocytopenia 04/01/2013  . Bursitis     spine per pt  . Macular degeneration 02/18/2014    Follows with [facility]  . Internal hemorrhoids   . Asthma    Past Surgical History  Procedure Laterality Date  . Carotid endarterectomy    . Colectomy      right hemi  . Cholecystectomy  11/09  . Upper gastrointestinal endoscopy    . Tonsillectomy    . Appendectomy     Family History  Problem Relation Age of Onset  . Colon cancer Paternal Grandmother   . Esophageal cancer Brother   . Heart disease Mother   . Diabetes Brother   . Hypertension Father   . Rectal cancer Neg Hx   . Stomach cancer  Neg Hx    Social History  Substance Use Topics  . Smoking status: Former Smoker -- 0.50 packs/day for 70 years    Types: Cigarettes    Start date: 02/14/1949    Quit date: 01/03/2014  . Smokeless tobacco: Never Used     Comment: Currently using tobacco  . Alcohol Use: No   OB History    No data available     Review of Systems  Constitutional: Positive for chills.  Respiratory: Positive for cough.   Gastrointestinal: Negative for abdominal pain and blood in stool.  Neurological: Positive for dizziness, weakness, light-headedness and headaches.  Psychiatric/Behavioral: Positive for confusion.    Allergies  Bactrim; Levofloxacin; Pneumovax; Amoxicillin-pot clavulanate; Penicillins; Bupropion; Haldol; Haloperidol; Morphine; Morphine and related; Pneumococcal vaccine; Pseudoephedrine hcl; and Sudafed  Home Medications   Prior to Admission medications    Medication Sig Start Date End Date Taking? Authorizing Provider  albuterol (PROAIR HFA) 108 (90 BASE) MCG/ACT inhaler Inhale 2 puffs into the lungs every 4 (four) hours as needed for wheezing or shortness of breath. 11/12/14   Monique Fleming  cholestyramine Lucrezia Starch) 4 G packet Take 1 packet (4 g total) by mouth daily as needed (diarrhea). 11/19/14   Monique Fleming  clonazePAM (KLONOPIN) 1 MG tablet Take 1 mg by mouth 2 (two) times daily as needed for anxiety.     Historical Provider, Fleming  clopidogrel (PLAVIX) 75 MG tablet TAKE ONE (1) TABLET EACH DAY 11/12/14   Monique Fleming  desvenlafaxine (PRISTIQ) 100 MG 24 hr tablet Take 100 mg by mouth daily.    Historical Provider, Fleming  fluticasone (FLONASE) 50 MCG/ACT nasal spray Place 2 sprays into both nostrils daily as needed for allergies. 11/12/14   Monique Fleming  formoterol (PERFOROMIST) 20 MCG/2ML nebulizer solution Take 2 mLs (20 mcg total) by nebulization 2 (two) times daily. 01/14/15   Monique Fleming  hydrocortisone (ANUSOL-HC) 25 MG suppository Use 1 suppository per rectum as needed. 03/14/15   Monique Fleming  KLOR-CON M10 10 MEQ tablet TAKE 1 TABLET TWICE A DAY 05/22/15   Monique Fleming  metFORMIN (GLUCOPHAGE) 500 MG tablet Take 1 tablet (500 mg total) by mouth daily with breakfast. Patient taking differently: Take 250 mg by mouth daily with breakfast. On sliding scale 01/08/15   Monique Fleming  mirtazapine (REMERON) 30 MG tablet Take 30 mg by mouth at bedtime. 1/2 tablet as needed    Historical Provider, Fleming  montelukast (SINGULAIR) 10 MG tablet Take 10 mg by mouth at bedtime.    Historical Provider, Fleming  Multiple Vitamins-Minerals (OCUVITE PRESERVISION) TABS Take by mouth 2 (two) times daily.    Historical Provider, Fleming  ondansetron (ZOFRAN) 4 MG tablet Take 1 tablet (4 mg total) by mouth every 6 (six) hours as needed for nausea or vomiting. 03/14/15   Monique Fleming  pantoprazole (PROTONIX) 40 MG tablet Take 1 tab  by mouth every morning. 03/14/15   Monique Fleming  phenytoin (DILANTIN) 100 MG ER capsule TAKE TWO CAPSULES BY MOUTH AT BEDTIME DAILY Patient taking differently: one capsule twice daily 11/06/14   Monique Fleming  Polyethyl Glycol-Propyl Glycol (SYSTANE) 0.4-0.3 % SOLN Place 1 drop into both eyes 2 (two) times daily as needed.    Historical Provider, Fleming  saccharomyces boulardii (FLORASTOR) 250 MG capsule Take 250 mg by mouth daily.    Historical Provider, Fleming  sodium chloride (OCEAN) 0.65 %  SOLN nasal spray Place 2 sprays into both nostrils daily.    Historical Provider, Fleming  traMADol (ULTRAM) 50 MG tablet TAKE ONE (1) TABLET BY MOUTH EVERY 12 HOURS AS NEEDED FOR MODERATE PAIN OR SEVERE PAIN 03/14/15   Monique Fleming   BP 159/71 mmHg  Pulse 94  Temp(Src) 98.2 F (36.8 C) (Oral)  Resp 18  Ht 5' 5.5" (1.664 m)  Wt 133 lb (60.328 kg)  BMI 21.79 kg/m2  SpO2 97% Physical Exam  Constitutional: She is oriented to person, place, and time. She appears well-developed and well-nourished. No distress.  HENT:  Head: Normocephalic and atraumatic.  Mouth/Throat: Oropharynx is clear and moist. No oropharyngeal exudate.  Eyes: Conjunctivae and EOM are normal. Pupils are equal, round, and reactive to light. Right eye exhibits no discharge. Left eye exhibits no discharge. No scleral icterus.  Neck: Normal range of motion. Neck supple. No JVD present. No thyromegaly present.  Cardiovascular: Normal rate, regular rhythm, normal heart sounds and intact distal pulses.  Exam reveals no gallop and no friction rub.   No murmur heard. Pulmonary/Chest: Effort normal and breath sounds normal. No respiratory distress. She has no wheezes. She has no rales.  Abdominal: Soft. Bowel sounds are normal. She exhibits no distension and no mass. There is no tenderness.  Musculoskeletal: Normal range of motion. She exhibits no edema or tenderness.  Lymphadenopathy:    She has no cervical adenopathy.  Neurological: She  is alert and oriented to person, place, and time. No cranial nerve deficit. She exhibits normal muscle tone. Coordination normal.  Skin: Skin is warm and dry. No rash noted. No erythema.  Psychiatric: She has a normal mood and affect. Her behavior is normal.  Nursing note and vitals reviewed.   ED Course  Procedures  DIAGNOSTIC STUDIES: Oxygen Saturation is 97% on RA, adequate by my interpretation.    COORDINATION OF CARE: 6:27 PM - Discussed plans to order diagnostic studies and imaging. Pt advised of plan for treatment and pt agrees.  8:15 PM - Discussed normal diagnostic study results and plans to discharge.   Labs Review Labs Reviewed  URINALYSIS, ROUTINE W REFLEX MICROSCOPIC (NOT AT [facility]) - Abnormal; Notable for the following:    APPearance CLOUDY (*)    All other components within normal limits  COMPREHENSIVE METABOLIC PANEL - Abnormal; Notable for the following:    Glucose, Bld 122 (*)    Calcium 8.2 (*)    Albumin 3.0 (*)    AST 42 (*)    All other components within normal limits  CBC WITH DIFFERENTIAL/PLATELET - Abnormal; Notable for the following:    RBC 3.54 (*)    Hemoglobin 9.9 (*)    HCT 30.8 (*)    Platelets 116 (*)    All other components within normal limits  BRAIN NATRIURETIC PEPTIDE - Abnormal; Notable for the following:    B Natriuretic Peptide 630.3 (*)    All other components within normal limits  TROPONIN I    Imaging Review Dg Chest 2 View  05/25/2015   CLINICAL DATA:  81 year old with 1 day history of cough and generalized weakness. Current history of COPD, asthma, diabetes and aortic stenosis. Former smoker.  EXAM: CHEST  2 VIEW  COMPARISON:  02/28/2015 and earlier.  FINDINGS: Cardiac silhouette normal in size, unchanged. Thoracic aorta mildly atherosclerotic and tortuous, unchanged. Hilar and mediastinal contours otherwise unremarkable. Prominent bronchovascular markings diffusely and moderate central peribronchial thickening, more so than on the  prior examinations. This  is superimposed upon baseline changes of hyperinflation and chronic bronchitis. Lungs otherwise clear. No localized airspace consolidation. No pleural effusions. No pneumothorax. Normal pulmonary vascularity. Generalized osseous demineralization.  IMPRESSION: Moderate changes of acute bronchitis and/or asthma superimposed upon COPD/emphysema. No focal airspace pneumonia.   Electronically Signed   By: Evangeline Dakin M.D.   On: 05/25/2015 15:32   Ct Head Wo Contrast  05/25/2015   CLINICAL DATA:  Acute onset of generalized weakness, with dizziness, lightheadedness, cough and chills. Headache and bilateral foot swelling. Initial encounter.  EXAM: CT HEAD WITHOUT CONTRAST  TECHNIQUE: Contiguous axial images were obtained from the base of the skull through the vertex without intravenous contrast.  COMPARISON:  CT of the head performed 08/22/2014  FINDINGS: There is no evidence of acute infarction, mass lesion, or intra- or extra-axial hemorrhage on CT.  Prominence of the ventricles and sulci reflects moderate cortical volume loss. Cerebellar atrophy is noted. Scattered periventricular and subcortical white matter change likely reflects small vessel ischemic microangiopathy. A chronic lacunar infarct is noted at the right cerebellar hemisphere.  The brainstem and fourth ventricle are within normal limits. The basal ganglia are unremarkable in appearance. The cerebral hemispheres demonstrate grossly normal gray-white differentiation. No mass effect or midline shift is seen.  There is no evidence of fracture; visualized osseous structures are unremarkable in appearance. The visualized portions of the orbits are within normal limits. The paranasal sinuses and mastoid air cells are well-aerated. No significant soft tissue abnormalities are seen.  IMPRESSION: 1. No acute intracranial pathology seen on CT. 2. Moderate cortical volume loss and scattered small vessel ischemic microangiopathy. 3.  Chronic lacunar infarct at the right cerebellar hemisphere.   Electronically Signed   By: Garald Balding M.D.   On: 05/25/2015 19:36   I have personally reviewed and evaluated these images and lab results as part of my medical decision-making.   EKG Interpretation None      MDM   Final diagnoses:  None    Workup reveals stable hemoglobin, unremarkable electrolytes, and workup which shows no specific explanation for her symptoms. Will discharge to home, follow up with pcp.  I personally performed the services described in this documentation, which was scribed in my presence. The recorded information has been reviewed and is accurate.      Monique Speak, Fleming 05/27/15 [phone]

## 2015-05-25 NOTE — Discharge Instructions (Signed)

## 2015-05-25 NOTE — ED Notes (Signed)
Pt reports that she has been weak and shaky since yesterday.  pts daughter reports chronic UTIs, states confusion and dysuria.

## 2015-05-27 ENCOUNTER — Other Ambulatory Visit: Payer: Self-pay | Admitting: Family Medicine

## 2015-05-27 DIAGNOSIS — D509 Iron deficiency anemia, unspecified: Secondary | ICD-10-CM

## 2015-05-28 ENCOUNTER — Telehealth: Payer: Self-pay | Admitting: Critical Care Medicine

## 2015-05-28 DIAGNOSIS — J449 Chronic obstructive pulmonary disease, unspecified: Secondary | ICD-10-CM

## 2015-05-28 NOTE — Telephone Encounter (Signed)
Per pt email from 05/21/15, pt will be traveling in Oct 2016 and is requesting portable battery operated O2 and cylinders.  Per Dr. Joya Gaskins, ok to place order for this.  Order placed.  Dr. Joya Gaskins has already responded to email stating order will be placed.

## 2015-06-12 ENCOUNTER — Ambulatory Visit (INDEPENDENT_AMBULATORY_CARE_PROVIDER_SITE_OTHER): Payer: Medicare Other | Admitting: Internal Medicine

## 2015-06-12 ENCOUNTER — Encounter: Payer: Self-pay | Admitting: Internal Medicine

## 2015-06-12 VITALS — BP 152/58 | HR 108 | Ht 65.0 in | Wt 129.2 lb

## 2015-06-12 DIAGNOSIS — D509 Iron deficiency anemia, unspecified: Secondary | ICD-10-CM | POA: Diagnosis not present

## 2015-06-12 DIAGNOSIS — K7460 Unspecified cirrhosis of liver: Secondary | ICD-10-CM

## 2015-06-12 DIAGNOSIS — K625 Hemorrhage of anus and rectum: Secondary | ICD-10-CM

## 2015-06-12 DIAGNOSIS — K589 Irritable bowel syndrome without diarrhea: Secondary | ICD-10-CM | POA: Diagnosis not present

## 2015-06-12 DIAGNOSIS — R197 Diarrhea, unspecified: Secondary | ICD-10-CM

## 2015-06-12 DIAGNOSIS — R159 Full incontinence of feces: Secondary | ICD-10-CM

## 2015-06-12 DIAGNOSIS — K5520 Angiodysplasia of colon without hemorrhage: Secondary | ICD-10-CM

## 2015-06-12 NOTE — Progress Notes (Signed)
HISTORY OF PRESENT ILLNESS:  Monique Fleming is a 81 y.o. female with MULTIPLE SIGNIFICANT medical problems as listed below. In this office alone, she has been followed for a history of colon cancer, chronic diarrhea, iron deficiency anemia secondary to gastrointestinal AVMs, GERD, functional abdominal complaints (IBS), and hepatic cirrhosis with portal hypertension. She presents today for follow-up regarding a myriad of issues. She is accompanied today by her daughter Monique Fleming. First, patient continues with diarrhea. Generally 3-4 loose bowel movements per day. Rarely has no bowel movement. Recently had up to 12. Questran does help, though she takes this sporadically. Next, ongoing problems with incontinence. She is unaware of incontinent episodes generally. She does wear protective undergarments. Next, minor intermittent rectal bleeding from hemorrhoids. She has been using suppository therapy. Next, ongoing chronic anemia. She is being followed closely by hematology with periodic iron infusions and blood transfusions if needed. She was advised to stop her oral iron therapy with the stated reason being concurrent use of PPI. She previously had problems with ascites and ankle edema, but not recently. She denies jaundice, fevers, or confusion. She is very hard of hearing.  REVIEW OF SYSTEMS:  All non-GI ROS negative except for fatigue, bruisability, hard of hearing \  Past Medical History  Diagnosis Date  . Aortic stenosis     moderate by echo 6/11 gradient 20/57mmHg for mean and peak  . TIA (transient ischemic attack)     Dr. [name]  . Peripheral vascular disease   . IBS (irritable bowel syndrome)   . Diabetes mellitus     type II  . Chronic bronchitis   . COPD (chronic obstructive pulmonary disease)   . Atherosclerosis   . Depression   . Anxiety     Dr Casimiro Needle  . Renal cyst   . Anemia     iron deficiency  . Cellulitis   . Diverticular disease   . Infectious diarrhea(009.2)   . Sinusitis    . Thrush   . Abnormal liver function test   . GERD (gastroesophageal reflux disease)   . Insomnia     chronic  . Adenocarcinoma 2008    colon,s/p right hemicolectomy  . Angina   . Seizures     hx of one seizure "  . Cirrhosis of liver     hx of  . Clostridium difficile diarrhea   . Thrombocytopenia 04/01/2013  . Bursitis     spine per pt  . Macular degeneration 02/18/2014    Follows with [facility]  . Internal hemorrhoids   . Asthma     Past Surgical History  Procedure Laterality Date  . Carotid endarterectomy    . Colectomy      right hemi  . Cholecystectomy  11/09  . Upper gastrointestinal endoscopy    . Tonsillectomy    . Appendectomy      Social History Monique Fleming  reports that she has been smoking Cigarettes.  She started smoking about 64 years ago. She has a 35 pack-year smoking history. She has never used smokeless tobacco. She reports that she does not drink alcohol or use illicit drugs.  family history includes Colon cancer in her paternal grandmother; Diabetes in her brother; Esophageal cancer in her brother; Heart disease in her mother; Hypertension in her father. There is no history of Rectal cancer or Stomach cancer.  Allergies  Allergen Reactions  . Bactrim [Sulfamethoxazole-Trimethoprim] Shortness Of Breath  . Levofloxacin Other (See Comments)    seizure  . Pneumovax [Pneumococcal  Polysaccharide Vaccine] Other (See Comments)    Severe allergy-cellulitis  . Amoxicillin-Pot Clavulanate Other (See Comments)    Patient stated Drs. recommend taking Augmentin due to her PCN allergy.  . Penicillins Rash  . Bupropion   . Haldol [Haloperidol Lactate]     Had unknown reaction many yrs ago per pt  . Haloperidol   . Morphine   . Morphine And Related     Confused and agitation  . Pneumococcal Vaccine   . Pseudoephedrine Hcl   . Sudafed [Pseudoephedrine Hcl] Other (See Comments)    seizure       PHYSICAL EXAMINATION: Vital signs: BP 152/58 mmHg   Pulse 108  Ht $R'5\' 5"'uJ$  (1.651 m)  Wt 129 lb 3.2 oz (58.605 kg)  BMI 21.50 kg/m2  Constitutional:Elderly female, chronically ill-appearing, no acute distress Psychiatric: alert and oriented x3, cooperative Eyes: extraocular movements intact, anicteric, conjunctiva pink but pale  Mouth: oral pharynx moist, no lesions Neck: supple without thyromegaly LYMPH:  no lymphadenopathy Cardiovascular: heart regular rate and rhythm, no murmur Lungs: clear to auscultation bilaterally Abdomen: soft, nontender, nondistended, no obvious ascites, no peritoneal signs, normal bowel sounds, no organomegaly. Prior surgical incisions well-healed  Rectal:Ommitted  Extremities: no  clubbing cyanosis or lower extremity edema bilaterally Skin: no lesions on visible extremities, Except for ecchymoses  Neuro: No focal deficits. No asterixis.  ASSESSMENT:  #1. Diarrhea. Multifactorial including altered luminal anatomy and IBS. Ongoing #2. Fecal incontinence. Severe. Ongoing #3. Intermittent hemorrhoidal bleeding. Ongoing #4. Iron deficiency anemia secondary to gastrointestinal AVMs and chronic Plavix use #5. Hepatic cirrhosis. Compensated #6. History of colon cancer status post resection. Last colonoscopy January 2014 without neoplasia status post right hemicolectomy #7. Last EGD February 2013 without varices. Mild portal gastropathy present #8. Medical problems   PLAN:  #1. Advised to take Questran twice daily. Aware that she should not take this within 2 hours of other medications #2. May try Imodium as a supplement if needed #3. Wear protective undergarments #4. Continue monitoring blood counts and iron infusion/red blood cell infusions as needed per hematology #5. Continue low-sodium diet #6. Routine GI follow-up one year. Sooner if needed  30 minutes was spent with the patient and her daughter. Greater than 50% of the time was used for counseling regarding her myriad diagnoses and treatment plan. As well  answering multiple questions

## 2015-06-12 NOTE — Patient Instructions (Addendum)
You have been given a separate informational sheet regarding your tobacco use, the importance of quitting and local resources to help you quit.  Please follow up as needed 

## 2015-06-18 ENCOUNTER — Other Ambulatory Visit: Payer: Self-pay | Admitting: Physician Assistant

## 2015-06-22 ENCOUNTER — Encounter: Payer: Self-pay | Admitting: Critical Care Medicine

## 2015-07-08 ENCOUNTER — Encounter: Payer: Self-pay | Admitting: Critical Care Medicine

## 2015-07-09 ENCOUNTER — Encounter: Payer: Self-pay | Admitting: Family Medicine

## 2015-07-09 ENCOUNTER — Ambulatory Visit (INDEPENDENT_AMBULATORY_CARE_PROVIDER_SITE_OTHER): Payer: Medicare Other | Admitting: Family Medicine

## 2015-07-09 ENCOUNTER — Encounter: Payer: Self-pay | Admitting: Hematology & Oncology

## 2015-07-09 VITALS — BP 140/76 | HR 84 | Temp 98.0°F | Ht 65.5 in | Wt 128.2 lb

## 2015-07-09 DIAGNOSIS — M542 Cervicalgia: Secondary | ICD-10-CM

## 2015-07-09 DIAGNOSIS — K219 Gastro-esophageal reflux disease without esophagitis: Secondary | ICD-10-CM

## 2015-07-09 DIAGNOSIS — E119 Type 2 diabetes mellitus without complications: Secondary | ICD-10-CM

## 2015-07-09 DIAGNOSIS — H3530 Unspecified macular degeneration: Secondary | ICD-10-CM

## 2015-07-09 DIAGNOSIS — R030 Elevated blood-pressure reading, without diagnosis of hypertension: Secondary | ICD-10-CM | POA: Diagnosis not present

## 2015-07-09 DIAGNOSIS — E785 Hyperlipidemia, unspecified: Secondary | ICD-10-CM

## 2015-07-09 DIAGNOSIS — E1159 Type 2 diabetes mellitus with other circulatory complications: Secondary | ICD-10-CM

## 2015-07-09 DIAGNOSIS — IMO0001 Reserved for inherently not codable concepts without codable children: Secondary | ICD-10-CM

## 2015-07-09 HISTORY — DX: Reserved for inherently not codable concepts without codable children: IMO0001

## 2015-07-09 MED ORDER — TRAMADOL HCL 50 MG PO TABS: ORAL_TABLET | ORAL | Status: DC

## 2015-07-09 MED ORDER — METFORMIN HCL 500 MG PO TABS: 250.0000 mg | ORAL_TABLET | Freq: Every day | ORAL | Status: DC

## 2015-07-09 NOTE — Progress Notes (Signed)
Pre visit review using our clinic review tool, if applicable. No additional management support is needed unless otherwise documented below in the visit note. 

## 2015-07-09 NOTE — Patient Instructions (Signed)

## 2015-07-10 ENCOUNTER — Other Ambulatory Visit: Payer: Self-pay | Admitting: *Deleted

## 2015-07-10 DIAGNOSIS — E119 Type 2 diabetes mellitus without complications: Secondary | ICD-10-CM

## 2015-07-10 DIAGNOSIS — E559 Vitamin D deficiency, unspecified: Secondary | ICD-10-CM

## 2015-07-10 DIAGNOSIS — D509 Iron deficiency anemia, unspecified: Secondary | ICD-10-CM

## 2015-07-10 DIAGNOSIS — E785 Hyperlipidemia, unspecified: Secondary | ICD-10-CM

## 2015-07-14 ENCOUNTER — Encounter: Payer: Self-pay | Admitting: Family Medicine

## 2015-07-14 NOTE — Progress Notes (Signed)
Subjective:    Patient ID: Monique Fleming, female    DOB: 1929-06-13, 81 y.o.   MRN: [id number]  Chief Complaint  Patient presents with  . Follow-up    HPI Patient is in today for follow-up on numerous conditions. She is accompanied by family. She has been managing the diagnosis of macular degeneration with retinal specialist and shots, no recent illness but she does continue to struggle with chronic pain and weakness. Struggles with high anxiety as well. No severe depression but some anhedonia is noted. Denies CP/palp/SOB/HA/congestion/fevers/GI or GU c/o. Taking meds as prescribed  Past Medical History  Diagnosis Date  . Aortic stenosis     moderate by echo 6/11 gradient 20/39mmHg for mean and peak  . TIA (transient ischemic attack)     Dr. Leonie Fleming  . Peripheral vascular disease ([place])   . IBS (irritable bowel syndrome)   . Diabetes mellitus     type II  . Chronic bronchitis   . COPD (chronic obstructive pulmonary disease) ([place])   . Atherosclerosis   . Depression   . Anxiety     Dr Monique Fleming  . Renal cyst   . Anemia     iron deficiency  . Cellulitis   . Diverticular disease   . Infectious diarrhea(009.2)   . Sinusitis   . Thrush   . Abnormal liver function test   . GERD (gastroesophageal reflux disease)   . Insomnia     chronic  . Adenocarcinoma ([place]) 2008    colon,s/p right hemicolectomy  . Angina   . Seizures (HCC)     hx of one seizure "  . Cirrhosis of liver (HCC)     hx of  . Clostridium difficile diarrhea   . Thrombocytopenia ([place]) 04/01/2013  . Bursitis     spine per pt  . Macular degeneration 02/18/2014    Follows with [facility]  . Internal hemorrhoids   . Asthma   . Elevated BP 07/09/2015  . Type 2 diabetes mellitus with vascular disease ([place]) 03/02/2007    Chronic      Past Surgical History  Procedure Laterality Date  . Carotid endarterectomy    . Colectomy      right hemi  . Cholecystectomy  11/09  . Upper gastrointestinal endoscopy    .  Tonsillectomy    . Appendectomy      Family History  Problem Relation Age of Onset  . Colon cancer Paternal Grandmother   . Esophageal cancer Brother   . Heart disease Mother   . Diabetes Brother   . Hypertension Father   . Rectal cancer Neg Hx   . Stomach cancer Neg Hx     Social History   Social History  . Marital Status: Widowed    Spouse Name: N/A  . Number of Children: 3  . Years of Education: N/A   Occupational History  . retired   .     Social History Main Topics  . Smoking status: Light Tobacco Smoker -- 0.50 packs/day for 70 years    Types: Cigarettes    Start date: 02/14/1951    Last Attempt to Quit: 01/03/2014  . Smokeless tobacco: Never Used     Comment: 2 daily; form given 06/12/15  . Alcohol Use: No  . Drug Use: No  . Sexual Activity: No   Other Topics Concern  . Not on file   Social History Narrative   Patient signed a Designated Party Release to allow her daughter, Monique Fleming,  to have access to her medical records/information. Entered by Daphane Shepherd March 24,2011 @ 2:47 pm   Dailly Caffeine Use    Outpatient Prescriptions Prior to Visit  Medication Sig Dispense Refill  . albuterol (PROAIR HFA) 108 (90 BASE) MCG/ACT inhaler Inhale 2 puffs into the lungs every 4 (four) hours as needed for wheezing or shortness of breath. 3 Inhaler 2  . cholestyramine (QUESTRAN) 4 G packet Take 1 packet (4 g total) by mouth daily as needed (diarrhea). 180 each 1  . clonazePAM (KLONOPIN) 1 MG tablet Take 1 mg by mouth 2 (two) times daily as needed for anxiety.     . clopidogrel (PLAVIX) 75 MG tablet TAKE ONE (1) TABLET EACH DAY 90 tablet 3  . desvenlafaxine (PRISTIQ) 100 MG 24 hr tablet Take 100 mg by mouth daily.    . fluticasone (FLONASE) 50 MCG/ACT nasal spray Place 2 sprays into both nostrils daily as needed for allergies. 48 g 3  . formoterol (PERFOROMIST) 20 MCG/2ML nebulizer solution Take 2 mLs (20 mcg total) by nebulization 2 (two) times daily. 120 mL 5  .  hydrocortisone (ANUSOL-HC) 25 MG suppository Use 1 suppository per rectum as needed. 20 suppository 1  . montelukast (SINGULAIR) 10 MG tablet Take 10 mg by mouth at bedtime.    . Multiple Vitamins-Minerals (OCUVITE PRESERVISION) TABS Take by mouth 2 (two) times daily.    . ondansetron (ZOFRAN) 4 MG tablet Take 1 tablet (4 mg total) by mouth every 6 (six) hours as needed for nausea or vomiting. 60 tablet 0  . pantoprazole (PROTONIX) 40 MG tablet Take 1 tab by mouth every morning. 90 tablet 3  . phenytoin (DILANTIN) 100 MG ER capsule TAKE TWO CAPSULES BY MOUTH AT BEDTIME DAILY (Patient taking differently: one capsule twice daily) 60 capsule 3  . Polyethyl Glycol-Propyl Glycol (SYSTANE) 0.4-0.3 % SOLN Place 1 drop into both eyes 2 (two) times daily as needed.    . saccharomyces boulardii (FLORASTOR) 250 MG capsule Take 250 mg by mouth daily.    . sodium chloride (OCEAN) 0.65 % SOLN nasal spray Place 2 sprays into both nostrils daily.    . metFORMIN (GLUCOPHAGE) 500 MG tablet Take 1 tablet (500 mg total) by mouth daily with breakfast. (Patient taking differently: Take 250 mg by mouth daily with breakfast. On sliding scale  prn) 30 tablet 5  . traMADol (ULTRAM) 50 MG tablet TAKE ONE (1) TABLET BY MOUTH EVERY 12 HOURS AS NEEDED FOR MODERATE PAIN OR SEVERE PAIN 30 tablet 0   No facility-administered medications prior to visit.    Allergies  Allergen Reactions  . Bactrim [Sulfamethoxazole-Trimethoprim] Shortness Of Breath  . Levofloxacin Other (See Comments)    seizure  . Pneumovax [Pneumococcal Polysaccharide Vaccine] Other (See Comments)    Severe allergy-cellulitis  . Amoxicillin-Pot Clavulanate Other (See Comments)    Patient stated Drs. recommend taking Augmentin due to her PCN allergy.  . Penicillins Rash  . Bupropion   . Haldol [Haloperidol Lactate]     Had unknown reaction many yrs ago per pt  . Haloperidol   . Morphine   . Morphine And Related     Confused and agitation  .  Pneumococcal Vaccine   . Pseudoephedrine Hcl   . Sudafed [Pseudoephedrine Hcl] Other (See Comments)    seizure    Review of Systems  Constitutional: Positive for malaise/fatigue. Negative for fever.  HENT: Negative for congestion.   Eyes: Negative for discharge.  Respiratory: Negative for shortness of breath.  Cardiovascular: Negative for chest pain, palpitations and leg swelling.  Gastrointestinal: Negative for nausea and abdominal pain.  Genitourinary: Negative for dysuria.  Musculoskeletal: Negative for falls.  Skin: Negative for rash.  Neurological: Negative for loss of consciousness and headaches.  Endo/Heme/Allergies: Negative for environmental allergies.  Psychiatric/Behavioral: Positive for depression. The patient is not nervous/anxious.        Objective:    Physical Exam  Constitutional: She is oriented to person, place, and time. She appears well-developed and well-nourished. No distress.  HENT:  Head: Normocephalic and atraumatic.  Nose: Nose normal.  Eyes: Right eye exhibits no discharge. Left eye exhibits no discharge.  Neck: Normal range of motion. Neck supple.  Cardiovascular: Normal rate and regular rhythm.   No murmur heard. Pulmonary/Chest: Effort normal and breath sounds normal.  Abdominal: Soft. Bowel sounds are normal. There is no tenderness.  Musculoskeletal: She exhibits no edema.  Neurological: She is alert and oriented to person, place, and time.  Skin: Skin is warm and dry.  Psychiatric: She has a normal mood and affect.  Nursing note and vitals reviewed.   BP 140/76 mmHg  Pulse 84  Temp(Src) 98 F (36.7 C) (Oral)  Ht 5' 5.5" (1.664 m)  Wt 128 lb 4 oz (58.174 kg)  BMI 21.01 kg/m2  SpO2 97% Wt Readings from Last 3 Encounters:  07/09/15 128 lb 4 oz (58.174 kg)  06/12/15 129 lb 3.2 oz (58.605 kg)  05/25/15 133 lb (60.328 kg)     Lab Results  Component Value Date   WBC 5.4 05/25/2015   HGB 9.9* 05/25/2015   HCT 30.8* 05/25/2015    PLT 116* 05/25/2015   GLUCOSE 122* 05/25/2015   CHOL 130 02/13/2014   TRIG 185* 02/13/2014   HDL 39* 02/13/2014   LDLDIRECT 124.3 03/20/2010   LDLCALC 54 02/13/2014   ALT 14 05/25/2015   AST 42* 05/25/2015   NA 142 05/25/2015   K 3.7 05/25/2015   CL 109 05/25/2015   CREATININE 0.63 05/25/2015   BUN 8 05/25/2015   CO2 28 05/25/2015   TSH 2.32 09/11/2014   INR 1.1* 08/16/2014   HGBA1C 7.7* 12/21/2014    Lab Results  Component Value Date   TSH 2.32 09/11/2014   Lab Results  Component Value Date   WBC 5.4 05/25/2015   HGB 9.9* 05/25/2015   HCT 30.8* 05/25/2015   MCV 87.0 05/25/2015   PLT 116* 05/25/2015   Lab Results  Component Value Date   NA 142 05/25/2015   K 3.7 05/25/2015   CHLORIDE 110* 04/25/2015   CO2 28 05/25/2015   GLUCOSE 122* 05/25/2015   BUN 8 05/25/2015   CREATININE 0.63 05/25/2015   BILITOT 0.6 05/25/2015   ALKPHOS 103 05/25/2015   AST 42* 05/25/2015   ALT 14 05/25/2015   PROT 6.6 05/25/2015   ALBUMIN 3.0* 05/25/2015   CALCIUM 8.2* 05/25/2015   ANIONGAP 5 05/25/2015   EGFR 73* 04/25/2015   GFR 79.57 02/15/2015   Lab Results  Component Value Date   CHOL 130 02/13/2014   Lab Results  Component Value Date   HDL 39* 02/13/2014   Lab Results  Component Value Date   LDLCALC 54 02/13/2014   Lab Results  Component Value Date   TRIG 185* 02/13/2014   Lab Results  Component Value Date   CHOLHDL 3.3 02/13/2014   Lab Results  Component Value Date   HGBA1C 7.7* 12/21/2014       Assessment & Plan:   Problem List Items  Addressed This Visit    Type 2 diabetes mellitus with vascular disease ([place])    minimize simple carbs. Increase exercise as tolerated. Continue current meds      Relevant Medications   metFORMIN (GLUCOPHAGE) 500 MG tablet   NECK PAIN    Allowed refill on Tramadol which she only uses sparingly with severe pain      Macular degeneration    Following with [facility]. Is following with Dr Sherlynn Stalls  and is receiving injections monthly to manage.       Hyperlipidemia    Encouraged heart healthy diet, increase exercise, avoid trans fats, consider a krill oil cap daily      Relevant Medications   traMADol (ULTRAM) 50 MG tablet   metFORMIN (GLUCOPHAGE) 500 MG tablet   Other Relevant Orders   TSH   Vit D  25 hydroxy (rtn osteoporosis monitoring)   Hemoglobin A1c   CBC   Lipid panel   Comprehensive metabolic panel   GERD    Avoid offending foods, start probiotics. Do not eat large meals in late evening and consider raising head of bed.       Elevated BP    Well controlled, no changes to meds. Encouraged heart healthy diet such as the DASH diet and exercise as tolerated.       Relevant Medications   traMADol (ULTRAM) 50 MG tablet   metFORMIN (GLUCOPHAGE) 500 MG tablet   Other Relevant Orders   TSH   Vit D  25 hydroxy (rtn osteoporosis monitoring)   Hemoglobin A1c   CBC   Lipid panel   Comprehensive metabolic panel   Diabetes mellitus type 2, uncomplicated (HCC) - Primary   Relevant Medications   traMADol (ULTRAM) 50 MG tablet   metFORMIN (GLUCOPHAGE) 500 MG tablet   Other Relevant Orders   TSH   Vit D  25 hydroxy (rtn osteoporosis monitoring)   Hemoglobin A1c   CBC   Lipid panel   Comprehensive metabolic panel      I have changed Ms. Nyborg's metFORMIN. I am also having her maintain her clonazePAM, OCUVITE PRESERVISION, saccharomyces boulardii, Polyethyl Glycol-Propyl Glycol, sodium chloride, phenytoin, clopidogrel, albuterol, fluticasone, cholestyramine, formoterol, montelukast, desvenlafaxine, ondansetron, hydrocortisone, pantoprazole, and traMADol.  Meds ordered this encounter  Medications  . traMADol (ULTRAM) 50 MG tablet    Sig: TAKE ONE (1) TABLET BY MOUTH EVERY 12 HOURS AS NEEDED FOR MODERATE PAIN OR SEVERE PAIN    Dispense:  30 tablet    Refill:  0  . metFORMIN (GLUCOPHAGE) 500 MG tablet    Sig: Take 0.5-1 tablets (250-500 mg total) by mouth daily with  breakfast.    Dispense:  90 tablet    Refill:  1     BLYTH, STACEY, MD

## 2015-07-14 NOTE — Assessment & Plan Note (Signed)
Avoid offending foods, start probiotics. Do not eat large meals in late evening and consider raising head of bed.  

## 2015-07-14 NOTE — Assessment & Plan Note (Signed)
minimize simple carbs. Increase exercise as tolerated. Continue current meds  

## 2015-07-14 NOTE — Assessment & Plan Note (Addendum)
Following with [facility]. Is following with Dr Sherlynn Stalls and is receiving injections monthly to manage.

## 2015-07-14 NOTE — Assessment & Plan Note (Signed)
Well controlled, no changes to meds. Encouraged heart healthy diet such as the DASH diet and exercise as tolerated.  °

## 2015-07-14 NOTE — Assessment & Plan Note (Signed)
Encouraged heart healthy diet, increase exercise, avoid trans fats, consider a krill oil cap daily 

## 2015-07-14 NOTE — Assessment & Plan Note (Signed)
Allowed refill on Tramadol which she only uses sparingly with severe pain

## 2015-07-15 ENCOUNTER — Telehealth: Payer: Self-pay

## 2015-07-15 NOTE — Telephone Encounter (Addendum)
Patient called office . States her Nebulizer machine has broken. Having trouble breathing. Has has machine for 10 years. Gave me phone number off machine,I called number but they are disritibutor to companies only. Says they only cover their machines for 3 years. Unable to look up patient by name and/or model number. Asked patient if she could come in office to be seen or go to ER. States she does not have a car and daughter can not transport because she is working. Asked patient if she could be transported via ambulance patient refuses. Says she cannot go another night without her nebulizer.  Is there any way we could get her a local company based on her Dx. Please advise. Left message on daughters phone per her request for call back.

## 2015-07-15 NOTE — Telephone Encounter (Signed)
[facility] to get her a nebulizer. Please confirm they accept her insurance and give them a verbal order to get the patient a new nebulizer for hypoxia and COPD. Then we can place an official referral  For [place] and [place], should also give order for all of the meds she receives via nebulizer.kar

## 2015-07-16 NOTE — Telephone Encounter (Signed)
Called patient states Nebulized was delivered this A.M.

## 2015-07-24 ENCOUNTER — Other Ambulatory Visit: Payer: Self-pay | Admitting: Family Medicine

## 2015-07-24 NOTE — Telephone Encounter (Signed)
Last filled:  07/09/15 Amt: 30, 0 Last OV: 07/09/15 Contract on file No UDS  Please advise.

## 2015-07-25 ENCOUNTER — Encounter: Payer: Self-pay | Admitting: Hematology & Oncology

## 2015-07-25 ENCOUNTER — Other Ambulatory Visit (HOSPITAL_BASED_OUTPATIENT_CLINIC_OR_DEPARTMENT_OTHER): Payer: Medicare Other

## 2015-07-25 ENCOUNTER — Other Ambulatory Visit: Payer: Medicare Other

## 2015-07-25 ENCOUNTER — Ambulatory Visit (HOSPITAL_BASED_OUTPATIENT_CLINIC_OR_DEPARTMENT_OTHER): Payer: Medicare Other

## 2015-07-25 ENCOUNTER — Ambulatory Visit (HOSPITAL_BASED_OUTPATIENT_CLINIC_OR_DEPARTMENT_OTHER): Payer: Medicare Other | Admitting: Hematology & Oncology

## 2015-07-25 VITALS — BP 127/60 | HR 110 | Temp 97.4°F | Resp 18 | Ht 65.0 in | Wt 128.0 lb

## 2015-07-25 DIAGNOSIS — E559 Vitamin D deficiency, unspecified: Secondary | ICD-10-CM

## 2015-07-25 DIAGNOSIS — Z85038 Personal history of other malignant neoplasm of large intestine: Secondary | ICD-10-CM | POA: Diagnosis not present

## 2015-07-25 DIAGNOSIS — D500 Iron deficiency anemia secondary to blood loss (chronic): Secondary | ICD-10-CM

## 2015-07-25 DIAGNOSIS — E785 Hyperlipidemia, unspecified: Secondary | ICD-10-CM

## 2015-07-25 DIAGNOSIS — K649 Unspecified hemorrhoids: Secondary | ICD-10-CM

## 2015-07-25 DIAGNOSIS — E119 Type 2 diabetes mellitus without complications: Secondary | ICD-10-CM

## 2015-07-25 DIAGNOSIS — D509 Iron deficiency anemia, unspecified: Secondary | ICD-10-CM

## 2015-07-25 DIAGNOSIS — K641 Second degree hemorrhoids: Secondary | ICD-10-CM

## 2015-07-25 DIAGNOSIS — K625 Hemorrhage of anus and rectum: Secondary | ICD-10-CM

## 2015-07-25 LAB — COMPREHENSIVE METABOLIC PANEL (CC13)
ALT: 13 U/L (ref 0–55)
AST: 35 U/L — AB (ref 5–34)
Albumin: 3 g/dL — ABNORMAL LOW (ref 3.5–5.0)
Alkaline Phosphatase: 92 U/L (ref 40–150)
Anion Gap: 5 mEq/L (ref 3–11)
BUN: 11.5 mg/dL (ref 7.0–26.0)
CHLORIDE: 110 meq/L — AB (ref 98–109)
CO2: 31 meq/L — AB (ref 22–29)
Calcium: 8.5 mg/dL (ref 8.4–10.4)
Creatinine: 0.8 mg/dL (ref 0.6–1.1)
EGFR: 64 mL/min/{1.73_m2} — AB (ref >90)
GLUCOSE: 128 mg/dL (ref 70–140)
POTASSIUM: 3.7 meq/L (ref 3.5–5.1)
SODIUM: 146 meq/L — AB (ref 136–145)
Total Bilirubin: 0.5 mg/dL (ref 0.20–1.20)
Total Protein: 6.6 g/dL (ref 6.4–8.3)

## 2015-07-25 LAB — CBC WITH DIFFERENTIAL (CANCER CENTER ONLY)
BASO#: 0 10*3/uL (ref 0.0–0.2)
BASO%: 0.5 % (ref 0.0–2.0)
EOS%: 4.9 % (ref 0.0–7.0)
Eosinophils Absolute: 0.2 10*3/uL (ref 0.0–0.5)
HEMATOCRIT: 30.6 % — AB (ref 34.8–46.6)
HGB: 9.3 g/dL — ABNORMAL LOW (ref 11.6–15.9)
LYMPH#: 1.3 10*3/uL (ref 0.9–3.3)
LYMPH%: 30.7 % (ref 14.0–48.0)
MCH: 23.9 pg — ABNORMAL LOW (ref 26.0–34.0)
MCHC: 30.4 g/dL — AB (ref 32.0–36.0)
MCV: 79 fL — AB (ref 81–101)
MONO#: 0.5 10*3/uL (ref 0.1–0.9)
MONO%: 11.2 % (ref 0.0–13.0)
NEUT#: 2.2 10*3/uL (ref 1.5–6.5)
NEUT%: 52.7 % (ref 39.6–80.0)
PLATELETS: 106 10*3/uL — AB (ref 145–400)
RBC: 3.89 10*6/uL (ref 3.70–5.32)
RDW: 15 % (ref 11.1–15.7)
WBC: 4.1 10*3/uL (ref 3.9–10.0)

## 2015-07-25 LAB — FERRITIN CHCC: Ferritin: 16 ng/ml (ref 9–269)

## 2015-07-25 LAB — LACTATE DEHYDROGENASE: LDH: 177 U/L (ref 94–250)

## 2015-07-25 LAB — IRON AND TIBC CHCC
%SAT: 7 % — ABNORMAL LOW (ref 21–57)
Iron: 21 ug/dL — ABNORMAL LOW (ref 41–142)
TIBC: 320 ug/dL (ref 236–444)
UIBC: 299 ug/dL (ref 120–384)

## 2015-07-25 LAB — TSH CHCC: TSH: 2.954 m[IU]/L (ref 0.308–3.960)

## 2015-07-25 MED ORDER — SODIUM CHLORIDE 0.9 % IV SOLN
Freq: Once | INTRAVENOUS | Status: AC
  Administered 2015-07-25: 09:00:00 via INTRAVENOUS

## 2015-07-25 MED ORDER — SODIUM CHLORIDE 0.9 % IV SOLN
510.0000 mg | Freq: Once | INTRAVENOUS | Status: AC
  Administered 2015-07-25: 510 mg via INTRAVENOUS
  Filled 2015-07-25: qty 17

## 2015-07-25 NOTE — Patient Instructions (Signed)

## 2015-07-25 NOTE — Progress Notes (Signed)
Hematology and Oncology Follow Up Visit  JYRA LAGARES [id number] [birth year] 81 y.o. 07/25/2015   Principle Diagnosis:  1. Stage I (T1 N0 M0) adenocarcinoma of the right colon. 2. Intermittent iron-deficiency anemia secondary to hemorrhoidal bleeding.  Current Therapy:   IV iron as indicated. Patient to receive a dose today.    Interim History: Ms. Nofziger comes is here today with her daughter for a follow-up. She is still having some hemorrhoidal issues. It sounds like she is not a candidate for any type of intervention. She does see Dr. Henrene Pastor of gastroenterology.  She has had no hematemesis. There's been no hemoptysis.  She got iron back in July. At bedtime, her iron saturation was only 90%. Her ferritin was 55.  Her appetite has been okay. She's had no nausea or vomiting.  She's had no issues with pain.  I think she has have some blood sugar issues. Patient now has macular degeneration. Medications:    Medication List       This list is accurate as of: 07/25/15  9:02 AM.  Always use your most recent med list.               albuterol 108 (90 BASE) MCG/ACT inhaler  Commonly known as:  PROAIR HFA  Inhale 2 puffs into the lungs every 4 (four) hours as needed for wheezing or shortness of breath.     cholestyramine 4 G packet  Commonly known as:  QUESTRAN  Take 1 packet (4 g total) by mouth daily as needed (diarrhea).     clonazePAM 1 MG tablet  Commonly known as:  KLONOPIN  Take 1 mg by mouth 2 (two) times daily as needed for anxiety.     clopidogrel 75 MG tablet  Commonly known as:  PLAVIX  TAKE ONE (1) TABLET EACH DAY     desvenlafaxine 100 MG 24 hr tablet  Commonly known as:  PRISTIQ  Take 100 mg by mouth daily.     fluticasone 50 MCG/ACT nasal spray  Commonly known as:  FLONASE  Place 2 sprays into both nostrils daily as needed for allergies.     formoterol 20 MCG/2ML nebulizer solution  Commonly known as:  PERFOROMIST  Take 2 mLs (20 mcg total) by  nebulization 2 (two) times daily.     hydrocortisone 25 MG suppository  Commonly known as:  ANUSOL-HC  Use 1 suppository per rectum as needed.     metFORMIN 500 MG tablet  Commonly known as:  GLUCOPHAGE  Take 0.5-1 tablets (250-500 mg total) by mouth daily with breakfast.     montelukast 10 MG tablet  Commonly known as:  SINGULAIR  Take 10 mg by mouth at bedtime.     OCUVITE PRESERVISION Tabs  Take by mouth 2 (two) times daily.     ondansetron 4 MG tablet  Commonly known as:  ZOFRAN  Take 1 tablet (4 mg total) by mouth every 6 (six) hours as needed for nausea or vomiting.     pantoprazole 40 MG tablet  Commonly known as:  PROTONIX  Take 1 tab by mouth every morning.     phenytoin 100 MG ER capsule  Commonly known as:  DILANTIN  TAKE TWO CAPSULES BY MOUTH AT BEDTIME DAILY     saccharomyces boulardii 250 MG capsule  Commonly known as:  FLORASTOR  Take 250 mg by mouth daily.     sodium chloride 0.65 % Soln nasal spray  Commonly known as:  OCEAN  Place 2 sprays into  both nostrils daily.     SYSTANE 0.4-0.3 % Soln  Generic drug:  Polyethyl Glycol-Propyl Glycol  Place 1 drop into both eyes 2 (two) times daily as needed.     traMADol 50 MG tablet  Commonly known as:  ULTRAM  TAKE 1 TABLET BY MOUTH EVERY 12 HOURS ASNEEDED FOR MODERATE TO SEVEREPAIN        Allergies:  Allergies  Allergen Reactions  . Bactrim [Sulfamethoxazole-Trimethoprim] Shortness Of Breath  . Levofloxacin Other (See Comments)    seizure  . Pneumovax [Pneumococcal Polysaccharide Vaccine] Other (See Comments)    Severe allergy-cellulitis  . Amoxicillin-Pot Clavulanate Other (See Comments)    Patient stated Drs. recommend taking Augmentin due to her PCN allergy.  . Penicillins Rash  . Bupropion   . Haldol [Haloperidol Lactate]     Had unknown reaction many yrs ago per pt  . Haloperidol   . Morphine   . Morphine And Related     Confused and agitation  . Pneumococcal Vaccine   . Pseudoephedrine  Hcl   . Sudafed [Pseudoephedrine Hcl] Other (See Comments)    seizure    Past Medical History, Surgical history, Social history, and Family History were reviewed and updated.  Review of Systems: All other 10 point review of systems is negative.   Physical Exam:  height is 5\' 5"  (1.651 m) and weight is 128 lb (58.06 kg). Her oral temperature is 97.4 F (36.3 C). Her blood pressure is 127/60 and her pulse is 110. Her respiration is 18.   Wt Readings from Last 3 Encounters:  07/25/15 128 lb (58.06 kg)  07/09/15 128 lb 4 oz (58.174 kg)  06/12/15 129 lb 3.2 oz (58.605 kg)    Ocular: Sclerae unicteric, pupils equal, round and reactive to light Ear-nose-throat: Oropharynx clear, dentition fair Lymphatic: No cervical or supraclavicular adenopathy Lungs no rales or rhonchi, good excursion bilaterally Heart regular rate and rhythm, no murmur appreciated Abd soft, nontender, positive bowel sounds MSK no focal spinal tenderness, no joint edema Neuro: non-focal, well-oriented, appropriate affect Breasts: Deferred  Lab Results  Component Value Date   WBC 4.1 07/25/2015   HGB 9.3* 07/25/2015   HCT 30.6* 07/25/2015   MCV 79* 07/25/2015   PLT 106* 07/25/2015   Lab Results  Component Value Date   FERRITIN 55 04/25/2015   IRON 45 04/25/2015   TIBC 231* 04/25/2015   UIBC 187 04/25/2015   IRONPCTSAT 19* 04/25/2015   Lab Results  Component Value Date   RETICCTPCT 1.7 12/23/2006   RBC 3.89 07/25/2015   RETICCTABS 77.5 12/23/2006   No results found for: KPAFRELGTCHN, LAMBDASER, [facility] Lab Results  Component Value Date   IGA 507* 08/23/2014   No results found for: Odetta Pink, SPEI   Chemistry      Component Value Date/Time   NA 142 05/25/2015 1845   NA 143 04/25/2015 0951   NA 137 03/06/2015 1113   K 3.7 05/25/2015 1845   K 3.2* 04/25/2015 0951   K 3.5 03/06/2015 1113   CL 109 05/25/2015 1845   CL 100 03/06/2015  1113   CO2 28 05/25/2015 1845   CO2 24 04/25/2015 0951   CO2 28 03/06/2015 1113   BUN 8 05/25/2015 1845   BUN 7.2 04/25/2015 0951   BUN 7 03/06/2015 1113   CREATININE 0.63 05/25/2015 1845   CREATININE 0.8 04/25/2015 0951   CREATININE 1.1 03/06/2015 1113      Component Value Date/Time  CALCIUM 8.2* 05/25/2015 1845   CALCIUM 7.9* 04/25/2015 0951   CALCIUM 7.9* 03/06/2015 1113   ALKPHOS 103 05/25/2015 1845   ALKPHOS 137 04/25/2015 0951   ALKPHOS 80 03/06/2015 1113   AST 42* 05/25/2015 1845   AST 38* 04/25/2015 0951   AST 30 03/06/2015 1113   ALT 14 05/25/2015 1845   ALT 17 04/25/2015 0951   ALT 15 03/06/2015 1113   BILITOT 0.6 05/25/2015 1845   BILITOT 0.33 04/25/2015 0951   BILITOT 0.80 03/06/2015 1113     Impression and Plan: Ms. Tessmer is a 81 year old white female. She is a remote history of colon cancer. This was stage I colon cancer. Other this was resected back in 2008.  We will go ahead and give her iron today and also give her iron in one week.  It sounds like the hemorrhoidal bleeding will continue to be a issue for her.  We will plan to see her back in another month or so. Hopefully, her hemoglobin should be much better.  Her daughter came with her today. I reviewed the labs. I spent about 25 minutes with them.  Volanda Napoleon, MD 10/20/20169:02 AM

## 2015-07-25 NOTE — Telephone Encounter (Signed)
Faxed hardcopy for Tramadol to [place].

## 2015-07-26 LAB — RETICULOCYTES (CHCC)
ABS RETIC: 55.3 10*3/uL (ref 19.0–186.0)
RBC.: 3.95 MIL/uL (ref 3.87–5.11)
RETIC CT PCT: 1.4 % (ref 0.4–2.3)

## 2015-07-26 LAB — LIPID PANEL
CHOL/HDL RATIO: 4.5 ratio (ref <=5.0)
Cholesterol: 108 mg/dL — ABNORMAL LOW (ref 125–200)
HDL: 24 mg/dL — AB (ref >=46)
LDL Cholesterol: 58 mg/dL (ref <130)
Triglycerides: 130 mg/dL (ref <150)
VLDL: 26 mg/dL (ref <30)

## 2015-07-26 LAB — HEMOGLOBIN A1C
HEMOGLOBIN A1C: 7.1 % — AB (ref <5.7)
MEAN PLASMA GLUCOSE: 157 mg/dL — AB (ref <117)

## 2015-07-26 LAB — VITAMIN D 25 HYDROXY (VIT D DEFICIENCY, FRACTURES): Vit D, 25-Hydroxy: 12 ng/mL — ABNORMAL LOW (ref 30–100)

## 2015-07-30 ENCOUNTER — Encounter: Payer: Self-pay | Admitting: Adult Health

## 2015-07-30 NOTE — Telephone Encounter (Signed)
Will forward to michelle and TP as an [place]

## 2015-08-01 ENCOUNTER — Ambulatory Visit (INDEPENDENT_AMBULATORY_CARE_PROVIDER_SITE_OTHER): Payer: Medicare Other | Admitting: Adult Health

## 2015-08-01 ENCOUNTER — Encounter: Payer: Self-pay | Admitting: Adult Health

## 2015-08-01 ENCOUNTER — Ambulatory Visit (HOSPITAL_BASED_OUTPATIENT_CLINIC_OR_DEPARTMENT_OTHER): Payer: Medicare Other

## 2015-08-01 VITALS — BP 141/79 | HR 67 | Temp 98.3°F | Resp 18

## 2015-08-01 VITALS — BP 130/74 | HR 116 | Temp 98.0°F | Ht 64.0 in | Wt 125.0 lb

## 2015-08-01 DIAGNOSIS — K625 Hemorrhage of anus and rectum: Secondary | ICD-10-CM

## 2015-08-01 DIAGNOSIS — J9611 Chronic respiratory failure with hypoxia: Secondary | ICD-10-CM

## 2015-08-01 DIAGNOSIS — K649 Unspecified hemorrhoids: Secondary | ICD-10-CM

## 2015-08-01 DIAGNOSIS — J449 Chronic obstructive pulmonary disease, unspecified: Secondary | ICD-10-CM

## 2015-08-01 DIAGNOSIS — D500 Iron deficiency anemia secondary to blood loss (chronic): Secondary | ICD-10-CM

## 2015-08-01 MED ORDER — AZITHROMYCIN 250 MG PO TABS
ORAL_TABLET | ORAL | Status: AC
Start: 2015-08-01 — End: 2015-08-06

## 2015-08-01 MED ORDER — SODIUM CHLORIDE 0.9 % IV SOLN
Freq: Once | INTRAVENOUS | Status: AC
  Administered 2015-08-01: 12:00:00 via INTRAVENOUS

## 2015-08-01 MED ORDER — SODIUM CHLORIDE 0.9 % IV SOLN
510.0000 mg | Freq: Once | INTRAVENOUS | Status: AC
  Administered 2015-08-01: 510 mg via INTRAVENOUS
  Filled 2015-08-01: qty 17

## 2015-08-01 NOTE — Progress Notes (Signed)
Reviewed & agree with plan  

## 2015-08-01 NOTE — Patient Instructions (Signed)

## 2015-08-01 NOTE — Patient Instructions (Signed)
Begin Zpack take as directed.  Mucinex DM Twice daily  As needed  Cough/congestion  Use Claritin 10mg  At bedtime  As needed  Drainage  Saline nasal rinses As needed   Follow up Dr. Elsworth Soho  In 4 months and As needed   May use spacer with inhaler .

## 2015-08-01 NOTE — Assessment & Plan Note (Signed)
Mild flare with URI  Encouraged on smoking cessation   Plan  Begin Zpack take as directed.  Mucinex DM Twice daily  As needed  Cough/congestion  Use Claritin 10mg  At bedtime  As needed  Drainage  Saline nasal rinses As needed   Follow up Dr. Elsworth Soho  In 4 months and As needed   May use spacer with inhaler .

## 2015-08-01 NOTE — Assessment & Plan Note (Signed)
Cont on O2 At bedtime   

## 2015-08-01 NOTE — Progress Notes (Signed)
   Subjective:    Patient ID: Monique Fleming, female    DOB: 1929-01-21, 81 y.o.   MRN: [id number]  HPI 81 yo female with COPD Gold B former smoker on nocturnal O2  08/01/2015 Follow up : COPD  Pt returns for 3 month follow up for COPD  Says she is doing well overall except she has some drainage and sore throat for last 4 days.  More cough with yellow mucus and low grade fever.   her daughter sent an email but says that she has been doing well with her new nebulizer machine.  She is recently been diagnosed with macular degeneration and is receiving  Eye injections. She also is getting iron infusion today .  She also requests spacer which will help her with her MDI inhlaers.  Remains on O2 At bedtime  .  Remains on Perforomist Nebs Twice daily.  Declines flu shots.  Still smoking , discussed cessation .  Denies chest pain, orthopnea, edema or hemoptysis      Review of Systems Constitutional:   No  weight loss, night sweats,  Fevers, chills,  +fatigue, or  lassitude.  HEENT:   No headaches,  Difficulty swallowing,  Tooth/dental problems, or  Sore throat,                No sneezing, itching, ear ache,  +nasal congestion, post nasal drip,   CV:  No chest pain,  Orthopnea, PND, swelling in lower extremities, anasarca, dizziness, palpitations, syncope.   GI  No heartburn, indigestion, abdominal pain, nausea, vomiting, diarrhea, change in bowel habits, loss of appetite, bloody stools.   Resp:  .    No chest wall deformity  Skin: no rash or lesions.  GU: no dysuria, change in color of urine, no urgency or frequency.  No flank pain, no hematuria   MS:  No joint pain or swelling.  No decreased range of motion.  No back pain.  Psych:  No change in mood or affect. No depression or anxiety.  No memory loss.         Objective:   Physical Exam GEN: A/Ox3; pleasant , NAD, frail and elderly   HEENT:  [place]/AT,  EACs-clear, TMs-wnl, NOSE-clear, THROAT-clear, no lesions, no postnasal  drip or exudate noted.    NECK:  Supple w/ fair ROM; no JVD; normal carotid impulses w/o bruits; no thyromegaly or nodules palpated; no lymphadenopathy.  RESP  Decreased BS in bases ,  w/o, wheezes/ rales/ or rhonchi.no accessory muscle use, no dullness to percussion  CARD:  RRR, no m/r/g  , no peripheral edema, pulses intact, no cyanosis or clubbing.  GI:   Soft & nt; nml bowel sounds; no organomegaly or masses detected.  Musco: Warm bil, no deformities or joint swelling noted.   Neuro: alert, no focal deficits noted.    Skin: Warm, no lesions or rashes         Assessment & Plan:

## 2015-08-05 ENCOUNTER — Encounter: Payer: Self-pay | Admitting: Adult Health

## 2015-08-05 ENCOUNTER — Encounter: Payer: Self-pay | Admitting: Family Medicine

## 2015-08-05 ENCOUNTER — Other Ambulatory Visit: Payer: Self-pay | Admitting: Adult Health

## 2015-08-05 DIAGNOSIS — J441 Chronic obstructive pulmonary disease with (acute) exacerbation: Secondary | ICD-10-CM

## 2015-08-05 MED ORDER — PREDNISONE 10 MG PO TABS: ORAL_TABLET | ORAL | Status: DC

## 2015-08-05 NOTE — Telephone Encounter (Signed)
Patient's daughter sent mychart message below:  Good Morning Monique Fleming. This is Monique Fleming's daughter Monique Fleming. Mom is on day 4 of the Z-pak you prescribed on Thursday. She hasn't run any temp, but has gotten more congested in her sinuses and chest. She does have a cough, which is productive, but her secretions are very thick. We are pushing fluids and staying up as much as possible. I was wondering if you might consider a round of prednisone to get her over the hump.  If you are in agreement please phone in to [facility]. Since she finishes the z-pak tomorrow, we will monitor things for a few days. As long as there's no temp. hopefully the prednisone will do the trick.  Thanks so much for helping [place] to best manager her care.

## 2015-08-06 ENCOUNTER — Other Ambulatory Visit: Payer: Self-pay | Admitting: Family Medicine

## 2015-08-06 MED ORDER — ERGOCALCIFEROL 1.25 MG (50000 UT) PO CAPS: 50000.0000 [IU] | ORAL_CAPSULE | ORAL | Status: DC

## 2015-08-18 ENCOUNTER — Other Ambulatory Visit: Payer: Self-pay | Admitting: Family Medicine

## 2015-09-04 ENCOUNTER — Other Ambulatory Visit: Payer: Medicare Other

## 2015-09-04 ENCOUNTER — Ambulatory Visit: Payer: Medicare Other | Admitting: Family

## 2015-09-04 ENCOUNTER — Ambulatory Visit: Payer: Medicare Other

## 2015-09-10 ENCOUNTER — Ambulatory Visit (HOSPITAL_BASED_OUTPATIENT_CLINIC_OR_DEPARTMENT_OTHER): Payer: Medicare Other | Admitting: Family

## 2015-09-10 ENCOUNTER — Other Ambulatory Visit: Payer: Self-pay | Admitting: *Deleted

## 2015-09-10 ENCOUNTER — Other Ambulatory Visit (HOSPITAL_BASED_OUTPATIENT_CLINIC_OR_DEPARTMENT_OTHER): Payer: Medicare Other

## 2015-09-10 ENCOUNTER — Ambulatory Visit: Payer: Medicare Other

## 2015-09-10 ENCOUNTER — Encounter: Payer: Self-pay | Admitting: Hematology & Oncology

## 2015-09-10 ENCOUNTER — Encounter: Payer: Self-pay | Admitting: Family

## 2015-09-10 ENCOUNTER — Telehealth: Payer: Self-pay | Admitting: *Deleted

## 2015-09-10 VITALS — BP 140/76 | HR 96 | Temp 97.8°F | Resp 20 | Ht 64.0 in | Wt 124.0 lb

## 2015-09-10 DIAGNOSIS — K648 Other hemorrhoids: Secondary | ICD-10-CM | POA: Diagnosis not present

## 2015-09-10 DIAGNOSIS — K644 Residual hemorrhoidal skin tags: Secondary | ICD-10-CM

## 2015-09-10 DIAGNOSIS — D500 Iron deficiency anemia secondary to blood loss (chronic): Secondary | ICD-10-CM | POA: Diagnosis not present

## 2015-09-10 DIAGNOSIS — Z85038 Personal history of other malignant neoplasm of large intestine: Secondary | ICD-10-CM | POA: Diagnosis present

## 2015-09-10 DIAGNOSIS — K641 Second degree hemorrhoids: Secondary | ICD-10-CM

## 2015-09-10 DIAGNOSIS — D509 Iron deficiency anemia, unspecified: Secondary | ICD-10-CM

## 2015-09-10 LAB — CBC WITH DIFFERENTIAL (CANCER CENTER ONLY)
BASO#: 0 10*3/uL (ref 0.0–0.2)
BASO%: 0.6 % (ref 0.0–2.0)
EOS ABS: 0.2 10*3/uL (ref 0.0–0.5)
EOS%: 3.5 % (ref 0.0–7.0)
HEMATOCRIT: 30.7 % — AB (ref 34.8–46.6)
HGB: 9.5 g/dL — ABNORMAL LOW (ref 11.6–15.9)
LYMPH#: 1.4 10*3/uL (ref 0.9–3.3)
LYMPH%: 26.4 % (ref 14.0–48.0)
MCH: 26.8 pg (ref 26.0–34.0)
MCHC: 30.9 g/dL — AB (ref 32.0–36.0)
MCV: 87 fL (ref 81–101)
MONO#: 0.6 10*3/uL (ref 0.1–0.9)
MONO%: 12.5 % (ref 0.0–13.0)
NEUT#: 2.9 10*3/uL (ref 1.5–6.5)
NEUT%: 57 % (ref 39.6–80.0)
Platelets: 118 10*3/uL — ABNORMAL LOW (ref 145–400)
RBC: 3.55 10*6/uL — ABNORMAL LOW (ref 3.70–5.32)
RDW: 20 % — AB (ref 11.1–15.7)
WBC: 5.1 10*3/uL (ref 3.9–10.0)

## 2015-09-10 LAB — CMP (CANCER CENTER ONLY)
ALT(SGPT): 13 U/L (ref 10–47)
AST: 34 U/L (ref 11–38)
Albumin: 2.7 g/dL — ABNORMAL LOW (ref 3.3–5.5)
Alkaline Phosphatase: 100 U/L — ABNORMAL HIGH (ref 26–84)
BUN, Bld: 7 mg/dL (ref 7–22)
CALCIUM: 8.2 mg/dL (ref 8.0–10.3)
CHLORIDE: 100 meq/L (ref 98–108)
CO2: 26 meq/L (ref 18–33)
Creat: 0.7 mg/dl (ref 0.6–1.2)
GLUCOSE: 165 mg/dL — AB (ref 73–118)
POTASSIUM: 3.1 meq/L — AB (ref 3.3–4.7)
Sodium: 134 mEq/L (ref 128–145)
Total Bilirubin: 0.8 mg/dl (ref 0.20–1.60)
Total Protein: 6.3 g/dL — ABNORMAL LOW (ref 6.4–8.1)

## 2015-09-10 LAB — IRON AND TIBC
%SAT: 13 % — AB (ref 21–57)
IRON: 28 ug/dL — AB (ref 41–142)
TIBC: 214 ug/dL — AB (ref 236–444)
UIBC: 186 ug/dL (ref 120–384)

## 2015-09-10 LAB — FERRITIN: Ferritin: 68 ng/ml (ref 9–269)

## 2015-09-10 NOTE — Progress Notes (Signed)
Hematology and Oncology Follow Up Visit  Monique Fleming [id number] 1928/11/03 81 y.o. 09/10/2015   Principle Diagnosis:  1. Stage I (T1 N0 M0) adenocarcinoma of the right colon. 2. Intermittent iron deficiency anemia.  Current Therapy:   Observation    Interim History: Monique Fleming comes is here today with her daughter for a follow-up. She is having some fatigue at times. She states that she sleeps a lot during the day and stays up most of the night.  She received 2 doses of Feraheme in October for and iron saturation of 7% and ferritin of 16.  She has SOB with exertion due to COPD. She is still smoking a few cigarettes a week.  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 She also has vision changes due to diabetes in her left eye. She sees her ophthalmologist regularly to monitor for changes.   Her appetite is decreased. She does not want to use her stove because she is afraid she may forget and leave it on and start a fire. She only eats things that are microwaved. This limits her meal choices. She has tried to get approved for meals on wheels but has not been able to yet. She is staying hydrated. Her weight is down 4 lbs since her last visit 2 months ago.   No fever, chills, n/v, cough, rash, headache, dizziness, chest pain, abdominal pain, changes in bowel or bladder habits.  She has a hemorrhoid that will occassionally bleed if she strains to have a BM. She states that he toilet tissue will occassionally be "pink." No swelling, tenderness, numbness or tingling in her extremities. No new aches or pains.   Medications:    Medication List       This list is accurate as of: 09/10/15  1:44 PM.  Always use your most recent med list.               albuterol 108 (90 BASE) MCG/ACT inhaler  Commonly known as:  PROAIR HFA  Inhale 2 puffs into the lungs every 4 (four) hours as needed for wheezing or shortness of breath.     cholestyramine 4 G packet    Commonly known as:  QUESTRAN  Take 1 packet (4 g total) by mouth daily as needed (diarrhea).     clonazePAM 1 MG tablet  Commonly known as:  KLONOPIN  Take 1 mg by mouth 2 (two) times daily as needed for anxiety.     clopidogrel 75 MG tablet  Commonly known as:  PLAVIX  TAKE ONE (1) TABLET EACH DAY     desvenlafaxine 100 MG 24 hr tablet  Commonly known as:  PRISTIQ  Take 100 mg by mouth daily.     ergocalciferol 50000 UNITS capsule  Commonly known as:  VITAMIN D2  Take 1 capsule (50,000 Units total) by mouth once a week.     fluticasone 50 MCG/ACT nasal spray  Commonly known as:  FLONASE  Place 2 sprays into both nostrils daily as needed for allergies.     formoterol 20 MCG/2ML nebulizer solution  Commonly known as:  PERFOROMIST  Take 2 mLs (20 mcg total) by nebulization 2 (two) times daily.     hydrocortisone 25 MG suppository  Commonly known as:  ANUSOL-HC  Use 1 suppository per rectum as needed.     KLOR-CON M10 10 MEQ tablet  Generic drug:  potassium chloride  TAKE 1 TABLET TWICE A DAY     metFORMIN  500 MG tablet  Commonly known as:  GLUCOPHAGE  Take 0.5-1 tablets (250-500 mg total) by mouth daily with breakfast.     montelukast 10 MG tablet  Commonly known as:  SINGULAIR  Take 10 mg by mouth at bedtime.     OCUVITE PRESERVISION Tabs  Take by mouth 2 (two) times daily.     ondansetron 4 MG tablet  Commonly known as:  ZOFRAN  Take 1 tablet (4 mg total) by mouth every 6 (six) hours as needed for nausea or vomiting.     pantoprazole 40 MG tablet  Commonly known as:  PROTONIX  Take 1 tab by mouth every morning.     phenytoin 100 MG ER capsule  Commonly known as:  DILANTIN  TAKE TWO CAPSULES BY MOUTH AT BEDTIME DAILY     predniSONE 10 MG tablet  Commonly known as:  DELTASONE  4 tabs for 2 days, then 3 tabs for 2 days, 2 tabs for 2 days, then 1 tab for 2 days, then stop     saccharomyces boulardii 250 MG capsule  Commonly known as:  FLORASTOR  Take 250  mg by mouth daily.     sodium chloride 0.65 % Soln nasal spray  Commonly known as:  OCEAN  Place 2 sprays into both nostrils daily.     SYSTANE 0.4-0.3 % Soln  Generic drug:  Polyethyl Glycol-Propyl Glycol  Place 1 drop into both eyes 2 (two) times daily as needed.     traMADol 50 MG tablet  Commonly known as:  ULTRAM  TAKE 1 TABLET BY MOUTH EVERY 12 HOURS ASNEEDED FOR MODERATE TO SEVEREPAIN     Vitamin D 2000 UNITS Caps  Take by mouth daily.        Allergies:  Allergies  Allergen Reactions  . Bactrim [Sulfamethoxazole-Trimethoprim] Shortness Of Breath  . Levofloxacin Other (See Comments)    seizure  . Pneumovax [Pneumococcal Polysaccharide Vaccine] Other (See Comments)    Severe allergy-cellulitis  . Amoxicillin-Pot Clavulanate Other (See Comments)    Patient stated Drs. recommend taking Augmentin due to her PCN allergy.  . Penicillins Rash  . Bupropion   . Haldol [Haloperidol Lactate]     Had unknown reaction many yrs ago per pt  . Haloperidol   . Morphine   . Morphine And Related     Confused and agitation  . Pneumococcal Vaccine   . Pseudoephedrine Hcl   . Sudafed [Pseudoephedrine Hcl] Other (See Comments)    seizure    Past Medical History, Surgical history, Social history, and Family History were reviewed and updated.  Review of Systems: All other 10 point review of systems is negative.   Physical Exam:  height is 5\' 4"  (1.626 m) and weight is 124 lb (56.246 kg). Her oral temperature is 97.8 F (36.6 C). Her blood pressure is 140/76 and her pulse is 96. Her respiration is 20.   Wt Readings from Last 3 Encounters:  09/10/15 124 lb (56.246 kg)  08/01/15 125 lb (56.7 kg)  07/25/15 128 lb (58.06 kg)    Ocular: Sclerae unicteric, pupils equal, round and reactive to light Ear-nose-throat: Oropharynx clear, dentition fair Lymphatic: No cervical or supraclavicular adenopathy Lungs no rales or rhonchi, good excursion bilaterally Heart regular rate and  rhythm, no murmur appreciated Abd soft, nontender, positive bowel sounds MSK no focal spinal tenderness, no joint edema Neuro: non-focal, well-oriented, appropriate affect Breasts: Deferred  Lab Results  Component Value Date   WBC 5.1 09/10/2015   HGB 9.5*  09/10/2015   HCT 30.7* 09/10/2015   MCV 87 09/10/2015   PLT 118* 09/10/2015   Lab Results  Component Value Date   FERRITIN 68 09/10/2015   IRON 28* 09/10/2015   TIBC 214* 09/10/2015   UIBC 186 09/10/2015   IRONPCTSAT 13* 09/10/2015   Lab Results  Component Value Date   RETICCTPCT 1.4 07/25/2015   RBC 3.55* 09/10/2015   RETICCTABS 55.3 07/25/2015   No results found for: Nils Pyle, [facility] Lab Results  Component Value Date   IGA 507* 08/23/2014   No results found for: Odetta Pink, SPEI   Chemistry      Component Value Date/Time   NA 134 09/10/2015 1054   NA 146* 07/25/2015 0822   NA 142 05/25/2015 1845   K 3.1* 09/10/2015 1054   K 3.7 07/25/2015 0822   K 3.7 05/25/2015 1845   CL 100 09/10/2015 1054   CL 109 05/25/2015 1845   CO2 26 09/10/2015 1054   CO2 31* 07/25/2015 0822   CO2 28 05/25/2015 1845   BUN 7 09/10/2015 1054   BUN 11.5 07/25/2015 0822   BUN 8 05/25/2015 1845   CREATININE 0.7 09/10/2015 1054   CREATININE 0.8 07/25/2015 0822   CREATININE 0.63 05/25/2015 1845      Component Value Date/Time   CALCIUM 8.2 09/10/2015 1054   CALCIUM 8.5 07/25/2015 0822   CALCIUM 8.2* 05/25/2015 1845   ALKPHOS 100* 09/10/2015 1054   ALKPHOS 92 07/25/2015 0822   ALKPHOS 103 05/25/2015 1845   AST 34 09/10/2015 1054   AST 35* 07/25/2015 0822   AST 42* 05/25/2015 1845   ALT 13 09/10/2015 1054   ALT 13 07/25/2015 0822   ALT 14 05/25/2015 1845   BILITOT 0.80 09/10/2015 1054   BILITOT 0.50 07/25/2015 0822   BILITOT 0.6 05/25/2015 1845     Impression and Plan: Ms. Minson is a 81 year old female with a history of stage I adenocarcinoma of the  right colon with resection in May 2008. So far, there has been no evidence of recurrence.  She has intermittent iron deficiency anemia due to bleeding internal and external hemorrhoids. She is having some fatigue at times.  Her Hgb today is 9.5. Iron saturations is 13% and ferratin is 68. She is gone for the day so we will bring her in later this week for a dose of Feraheme.  We will plan to see her back in 2 months for labs and follow-up.  She will contact us with any questions or concerns. We can certainly see her sooner if need be.   Eliezer Bottom, NP 12/6/20161:44 PM

## 2015-09-10 NOTE — Telephone Encounter (Signed)
Eliezer Bottom, NP  Cordelia Poche, RN           She will need 1 dose of iron this week please. Thank you!   Monique Fleming     Patient aware. Appointment made

## 2015-09-10 NOTE — Progress Notes (Signed)
No treatment today per Sarah [place] NP 

## 2015-09-11 ENCOUNTER — Encounter: Payer: Self-pay | Admitting: *Deleted

## 2015-09-12 ENCOUNTER — Telehealth: Payer: Self-pay | Admitting: Family Medicine

## 2015-09-12 ENCOUNTER — Other Ambulatory Visit: Payer: Self-pay | Admitting: Family Medicine

## 2015-09-12 ENCOUNTER — Ambulatory Visit (HOSPITAL_BASED_OUTPATIENT_CLINIC_OR_DEPARTMENT_OTHER): Payer: Medicare Other

## 2015-09-12 VITALS — BP 134/43 | HR 87 | Temp 97.7°F | Resp 16

## 2015-09-12 DIAGNOSIS — D500 Iron deficiency anemia secondary to blood loss (chronic): Secondary | ICD-10-CM

## 2015-09-12 DIAGNOSIS — K625 Hemorrhage of anus and rectum: Secondary | ICD-10-CM

## 2015-09-12 DIAGNOSIS — K649 Unspecified hemorrhoids: Secondary | ICD-10-CM | POA: Diagnosis not present

## 2015-09-12 DIAGNOSIS — R5381 Other malaise: Secondary | ICD-10-CM

## 2015-09-12 DIAGNOSIS — D509 Iron deficiency anemia, unspecified: Secondary | ICD-10-CM

## 2015-09-12 LAB — ERYTHROPOIETIN: Erythropoietin: 27.6 m[IU]/mL — ABNORMAL HIGH (ref 2.6–18.5)

## 2015-09-12 MED ORDER — SODIUM CHLORIDE 0.9 % IV SOLN
510.0000 mg | Freq: Once | INTRAVENOUS | Status: AC
  Administered 2015-09-12: 510 mg via INTRAVENOUS
  Filled 2015-09-12: qty 17

## 2015-09-12 MED ORDER — SODIUM CHLORIDE 0.9 % IV SOLN
Freq: Once | INTRAVENOUS | Status: AC
  Administered 2015-09-12: 15:00:00 via INTRAVENOUS

## 2015-09-12 NOTE — Patient Instructions (Addendum)

## 2015-09-12 NOTE — Telephone Encounter (Signed)
Pt said that she is not able to eat properly. Either she doesn't to or there are other issues. She said that it causes her blood to drop. She states she had to receive a pint of blood this morning. Pt is asking if Home health can be ordered to come back to the home for her.

## 2015-09-12 NOTE — Telephone Encounter (Signed)
If she means her blood sugar is dropping she should stop her metformin and then I will send out home health to evaluate her for safety and medication management.

## 2015-09-13 NOTE — Telephone Encounter (Signed)
Patient informed [facility] referral has been placed.

## 2015-09-18 ENCOUNTER — Telehealth: Payer: Self-pay | Admitting: Family Medicine

## 2015-09-18 NOTE — Telephone Encounter (Addendum)
Called back and to give verbal order.  Left verbal order on voice mail as directed.   Advised to also send orders via fax for Dr. Charlett Blake to sign.  Encouraged to call back with questions or concerns.  Message routed to Dr. Charlett Blake for [place].

## 2015-09-18 NOTE — Telephone Encounter (Signed)
Caller name: Constance Haw, PT with [facility] Can be reached: [phone]  Reason for call: Pt was evaluated 09/17/15. They are planning for 1 week 1, 2 week 3 for balance and gait training, strength training, and home exercise program. Please call back with verbal OK. Ok to leave on voicemail.

## 2015-09-19 NOTE — Telephone Encounter (Signed)
perfect

## 2015-09-20 ENCOUNTER — Telehealth: Payer: Self-pay | Admitting: Pulmonary Disease

## 2015-09-20 ENCOUNTER — Other Ambulatory Visit: Payer: Self-pay | Admitting: Adult Health

## 2015-09-20 MED ORDER — PREDNISONE 10 MG PO TABS: ORAL_TABLET | ORAL | Status: DC

## 2015-09-20 NOTE — Telephone Encounter (Signed)
Called spoke with pt. She c/o increase SOB, wheezing at night, dry cough, chest tx x last night. She wants something called in. She has not way to come in for an OV.  Please advise MW in RA absence.

## 2015-09-20 NOTE — Telephone Encounter (Signed)
Prednisone 10 mg take  4 each am x 2 days,   2 each am x 2 days,  1 each am x 2 days and stop > ov next week if not better

## 2015-09-20 NOTE — Telephone Encounter (Signed)
Called spoke with patient and advised of MW's recommendations as stated below.  Pt voiced her understanding and denied any questions/concerns.  Pt is aware to contact the office if her symptoms do not improve or they worsen.  Rx sent to verified pharmacy.  Nothing further needed; will sign off.

## 2015-09-27 ENCOUNTER — Telehealth: Payer: Self-pay | Admitting: Family Medicine

## 2015-09-27 NOTE — Telephone Encounter (Signed)
Noted. Message forward to PCP

## 2015-09-27 NOTE — Telephone Encounter (Addendum)
Caller name: Mitzi Davenport  Relation to pt: Physical Therapy  [facility]  Call back number: [phone] Pharmacy:  Reason for call: FYI-  frequency has changed due to the holidays patient will be seen next week 1x.

## 2015-10-01 ENCOUNTER — Telehealth: Payer: Self-pay | Admitting: Family Medicine

## 2015-10-01 NOTE — Telephone Encounter (Signed)
Received Home Health Certification and Plan of Care paperwork from [facility] for patient; forwarded to PCP for completion when she returns to office on October 10, 2015/SLS

## 2015-10-04 ENCOUNTER — Telehealth: Payer: Self-pay | Admitting: *Deleted

## 2015-10-04 NOTE — Telephone Encounter (Signed)
Received Missed Visit Report from [facility], forward to provider for return to office Fri, 10/10/15/SLS

## 2015-10-15 NOTE — Telephone Encounter (Addendum)
Completed paperwork faxed to [facility] at [phone]/SLS 01/09

## 2015-10-16 ENCOUNTER — Telehealth: Payer: Self-pay | Admitting: *Deleted

## 2015-10-16 NOTE — Telephone Encounter (Signed)
Received fax from [facility]; forwarded to provider/SLS 01/11

## 2015-10-18 ENCOUNTER — Other Ambulatory Visit: Payer: Self-pay | Admitting: Family Medicine

## 2015-10-18 ENCOUNTER — Ambulatory Visit: Payer: Medicare Other | Admitting: Cardiovascular Disease

## 2015-10-21 NOTE — Telephone Encounter (Signed)
Completed form faxed,sent to scan/SLS 01/13

## 2015-10-22 NOTE — Telephone Encounter (Signed)
Received completed, fax and sent to scan/SLS

## 2015-10-29 ENCOUNTER — Telehealth: Payer: Self-pay | Admitting: Family Medicine

## 2015-10-29 NOTE — Telephone Encounter (Signed)
Called to schedule AWV with RN, no answer or machine, pt is also due for f/u with Dr. Charlett Blake in February If pt calls schedule AWV with RN prior to f/u with Dr. Charlett Blake

## 2015-10-30 ENCOUNTER — Telehealth: Payer: Self-pay | Admitting: *Deleted

## 2015-10-30 NOTE — Telephone Encounter (Signed)
Received Home Health Certification and Plan of Care from [facility]; forwarded to provider/SLS 01/25

## 2015-11-05 ENCOUNTER — Ambulatory Visit (INDEPENDENT_AMBULATORY_CARE_PROVIDER_SITE_OTHER): Payer: Medicare Other | Admitting: Family Medicine

## 2015-11-05 ENCOUNTER — Ambulatory Visit (HOSPITAL_BASED_OUTPATIENT_CLINIC_OR_DEPARTMENT_OTHER)
Admission: RE | Admit: 2015-11-05 | Discharge: 2015-11-05 | Disposition: A | Discharge status: Home / Self Care | Payer: Medicare Other | Source: Ambulatory Visit | Attending: Family Medicine | Admitting: Family Medicine

## 2015-11-05 ENCOUNTER — Encounter: Payer: Self-pay | Admitting: Family Medicine

## 2015-11-05 VITALS — BP 147/67 | HR 111 | Ht 66.0 in | Wt 125.0 lb

## 2015-11-05 DIAGNOSIS — M79601 Pain in right arm: Secondary | ICD-10-CM

## 2015-11-05 DIAGNOSIS — M25511 Pain in right shoulder: Secondary | ICD-10-CM

## 2015-11-05 DIAGNOSIS — M8580 Other specified disorders of bone density and structure, unspecified site: Secondary | ICD-10-CM | POA: Diagnosis not present

## 2015-11-05 DIAGNOSIS — Z85038 Personal history of other malignant neoplasm of large intestine: Secondary | ICD-10-CM | POA: Diagnosis not present

## 2015-11-05 MED ORDER — METHYLPREDNISOLONE ACETATE 40 MG/ML IJ SUSP
40.0000 mg | Freq: Once | INTRAMUSCULAR | Status: AC
Start: 2015-11-05 — End: 2015-11-05
  Administered 2015-11-05: 40 mg via INTRA_ARTICULAR

## 2015-11-05 MED ORDER — HYDROCODONE-ACETAMINOPHEN 5-325 MG PO TABS: 1.0000 | ORAL_TABLET | Freq: Four times a day (QID) | ORAL | Status: DC | PRN

## 2015-11-05 NOTE — Patient Instructions (Signed)
Your shoulder pain is due to a combination of arthritis and rotator cuff impingement. You were given a cortisone shot today. Take norco as needed for severe pain (can make you constipated to watch this. Can't drive when taking this also). Do arm circles and arm swing exercises I showed you. Follow up with me in 2 weeks if you're really struggling.  Otherwise follow up with me in 1 month.

## 2015-11-07 DIAGNOSIS — M25511 Pain in right shoulder: Secondary | ICD-10-CM | POA: Insufficient documentation

## 2015-11-07 IMAGING — CT CT HEAD W/O CM
2 series · 15 of 30 positions shown, 19 images · non-contrast
Comparison: 06/21/2013

CLINICAL DATA: 84-year-old with altered mental status, weakness,
diarrhea

EXAM:
CT HEAD WITHOUT CONTRAST
TECHNIQUE: Contiguous axial images were obtained from the base of the skull
through the vertex without intravenous contrast.

[Series 2: head w/o · axial · non-contrast · 0.45mm/px · z∈[-123,-3]mm · 13 of 30 slices shown, 17 images]
[im 3/30  brain]
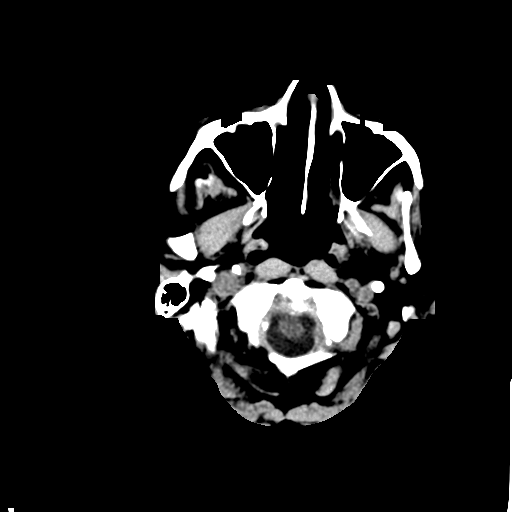
[im 3/30  bone]
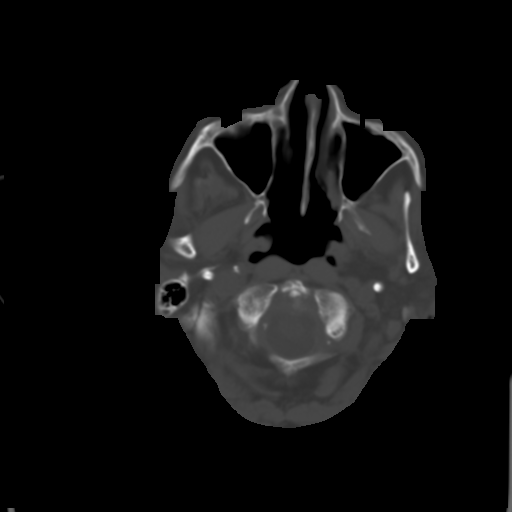
[im 5/30  brain]
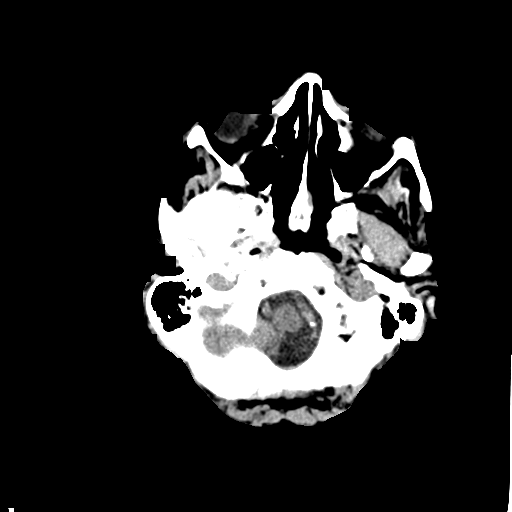
[im 7/30  brain]
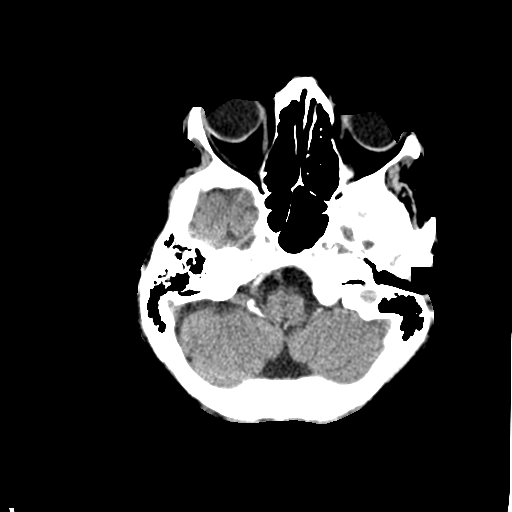
[im 9/30  brain]
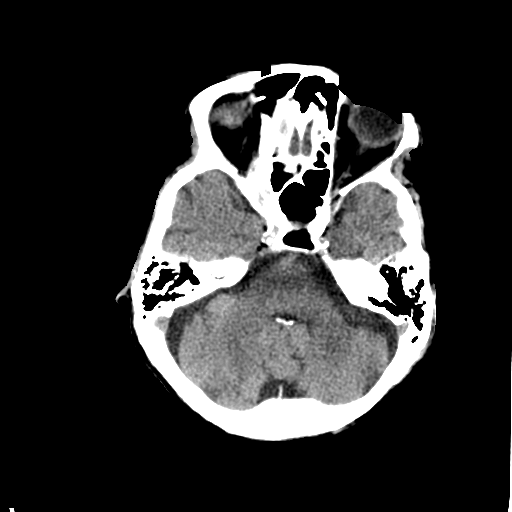
[im 11/30  brain]
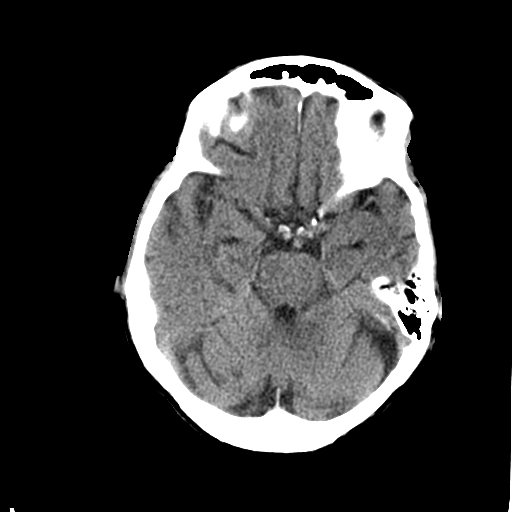
[im 11/30  bone]
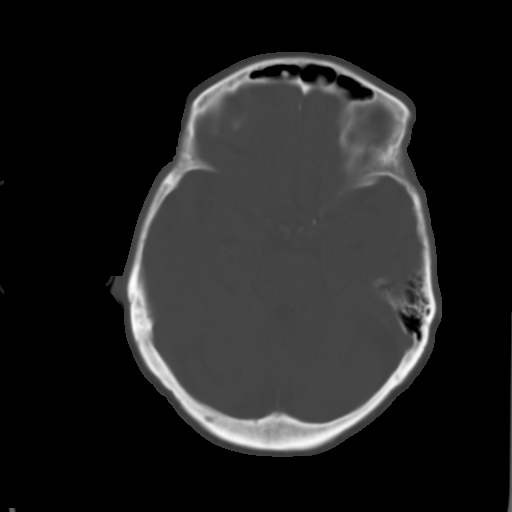
[im 13/30  brain]
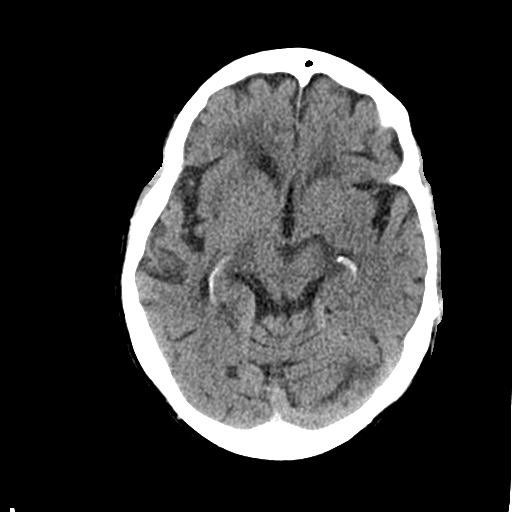
[im 15/30  brain]
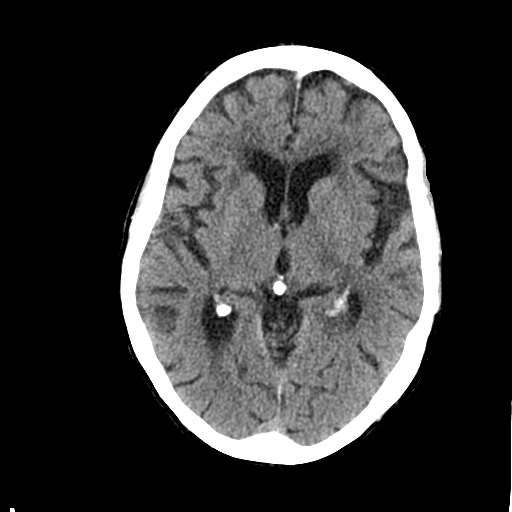
[im 17/30  brain]
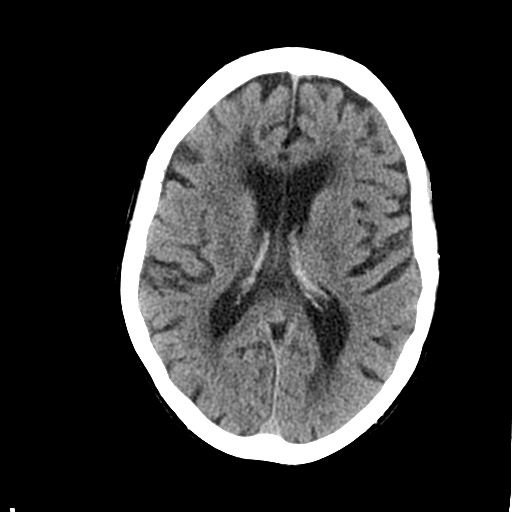
[im 19/30  brain]
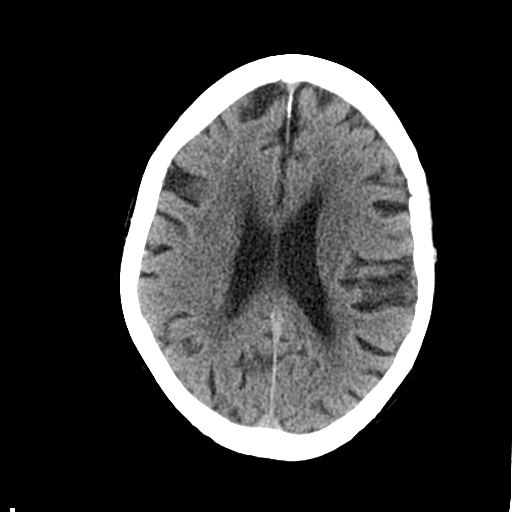
[im 19/30  bone]
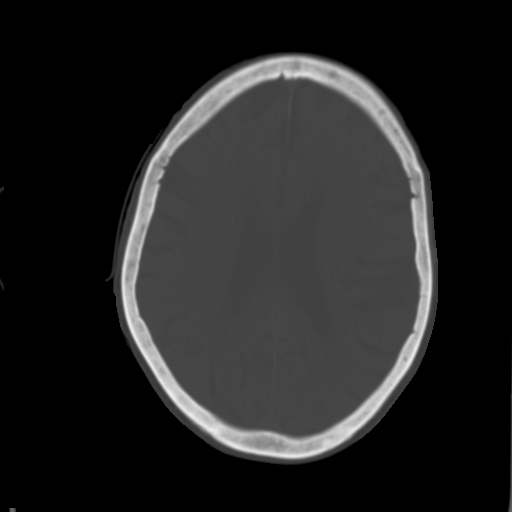
[im 21/30  brain]
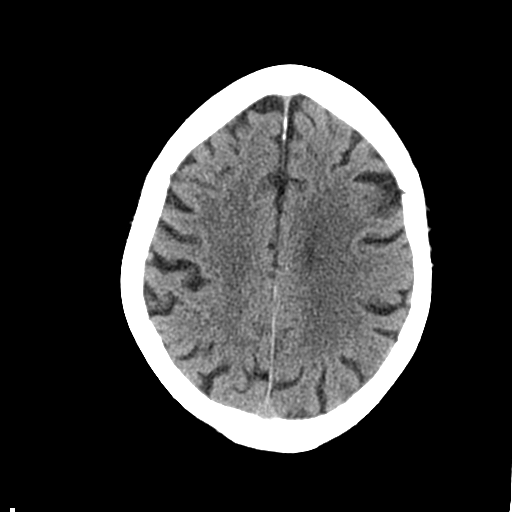
[im 23/30  brain]
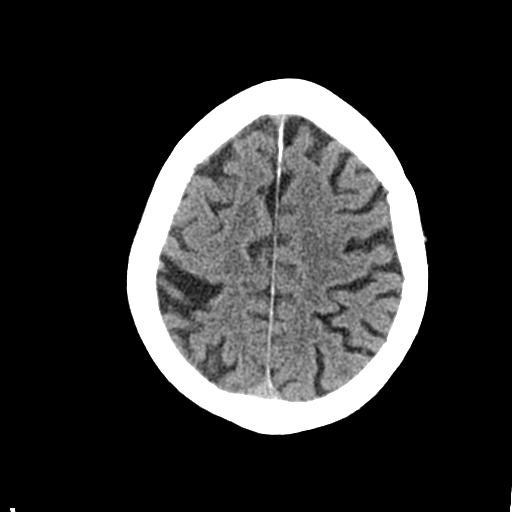
[im 25/30  brain]
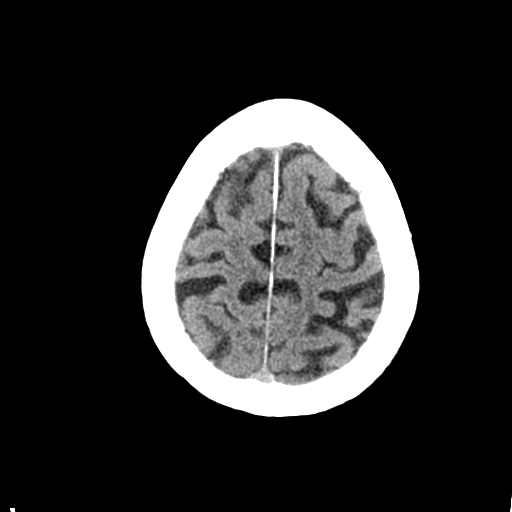
[im 27/30  brain]
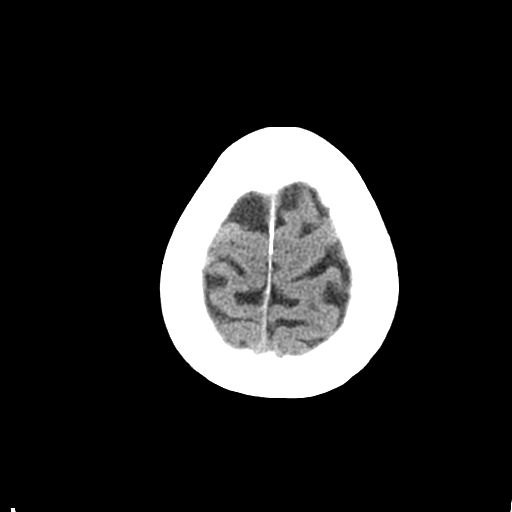
[im 27/30  bone]
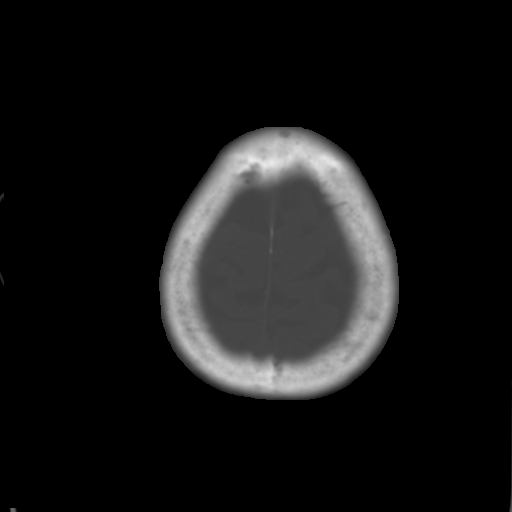

[Series 3: bone windows · axial · 0.45mm/px · z∈[-123,-103]mm · 2 of 30 slices shown]
[im 3/30  bone]
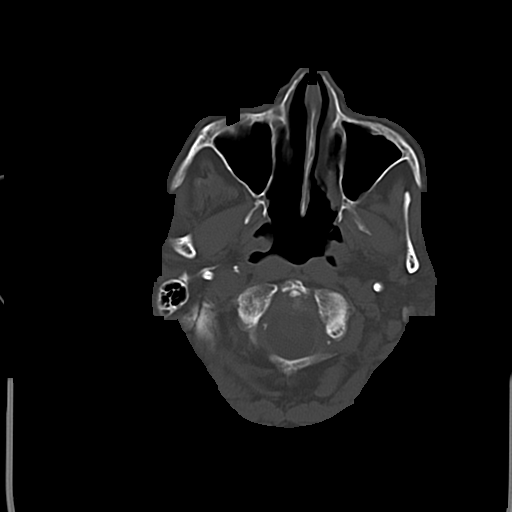
[im 7/30  bone]
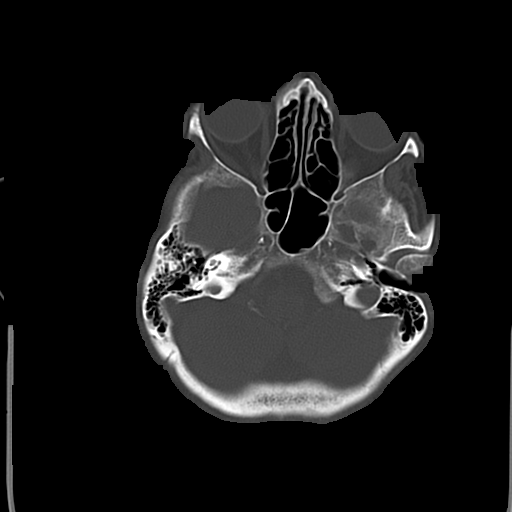

[15 of 30 positions shown; findings below may reference images not displayed]

FINDINGS: There is diffuse, confluent, periventricular white matter
hypodensity, compatible with chronic small vessel white matter
disease.

Small remote infarcts are seen involving the right basal ganglia,
right occipital lobe and right cerebellum.

Bilateral basal ganglia calcifications are present.

Cerebral and cerebellar atrophy are similar to prior.

There is no evidence of an acute infarct.

There is no acute intracranial hemorrhage.

There is no midline shift or mass effect.

There is no extra-axial fluid collection.

The paranasal sinuses and mastoid air cells are aerated.

The orbits are intact.  Sequela of prior cataract surgery is noted.

Vascular calcifications are noted in the region of the skull base.
IMPRESSION: 1. Extensive chronic small vessel white matter disease.
2. Remote infarcts.
3. No acute intracranial process.

## 2015-11-07 NOTE — Assessment & Plan Note (Signed)
independently reviewed radiographs - no bony abnormalities though she does have some glenohumeral DJD.  Pain due to both this and rotator cuff impingement.  Given combination injection.  Shown ROM exercises.  Norco as needed for severe pain.  F/u in 1 month (2 weeks if struggling).  After informed written consent, patient was seated on exam table. Right shoulder was prepped with alcohol swab and utilizing posterior approach, patient's right shoulder was injected with 6:2 marcaine:depomedrol with half in the subacromial space and half in glenohumeral space.  Patient tolerated the procedure well without immediate complications.

## 2015-11-07 NOTE — Progress Notes (Signed)
PCP: Penni Homans, MD  Subjective:   HPI: Patient is a 82 y.o. female here for right shoulder pain.  Patient reports she's had right shoulder pain for several months. Pain level 9/10 and sharp lateral shoulder into upper arm. Right handed. Motion more limited. No skin changes, fever, other complaints.  Past Medical History  Diagnosis Date  . Aortic stenosis     moderate by echo 6/11 gradient 20/28mmHg for mean and peak  . TIA (transient ischemic attack)     Dr. [name]  . Peripheral vascular disease ([place])   . IBS (irritable bowel syndrome)   . Diabetes mellitus     type II  . Chronic bronchitis   . COPD (chronic obstructive pulmonary disease) ([place])   . Atherosclerosis   . Depression   . Anxiety     Dr Casimiro Needle  . Renal cyst   . Anemia     iron deficiency  . Cellulitis   . Diverticular disease   . Infectious diarrhea(009.2)   . Sinusitis   . Thrush   . Abnormal liver function test   . GERD (gastroesophageal reflux disease)   . Insomnia     chronic  . Adenocarcinoma ([place]) 2008    colon,s/p right hemicolectomy  . Angina   . Seizures (HCC)     hx of one seizure "  . Cirrhosis of liver (HCC)     hx of  . Clostridium difficile diarrhea   . Thrombocytopenia ([place]) 04/01/2013  . Bursitis     spine per pt  . Macular degeneration 02/18/2014    Follows with [facility]  . Internal hemorrhoids   . Asthma   . Elevated BP 07/09/2015  . Type 2 diabetes mellitus with vascular disease ([place]) 03/02/2007    Chronic      Current Outpatient Prescriptions on File Prior to Visit  Medication Sig Dispense Refill  . albuterol (PROAIR HFA) 108 (90 BASE) MCG/ACT inhaler Inhale 2 puffs into the lungs every 4 (four) hours as needed for wheezing or shortness of breath. 3 Inhaler 2  . Cholecalciferol (VITAMIN D) 2000 UNITS CAPS Take by mouth daily.    . cholestyramine (QUESTRAN) 4 G packet Take 1 packet (4 g total) by mouth daily as needed (diarrhea). 180 each 1  . clonazePAM (KLONOPIN) 1 MG  tablet Take 1 mg by mouth 2 (two) times daily as needed for anxiety.     . clopidogrel (PLAVIX) 75 MG tablet TAKE 1 TABLET DAILY 90 tablet 2  . desvenlafaxine (PRISTIQ) 100 MG 24 hr tablet Take 100 mg by mouth daily.    . ergocalciferol (VITAMIN D2) 50000 UNITS capsule Take 1 capsule (50,000 Units total) by mouth once a week. 4 capsule 4  . fluticasone (FLONASE) 50 MCG/ACT nasal spray USE 2 SPRAYS IN EACH NOSTRIL DAILY AS NEEDED FOR ALLERGIES 48 g 2  . formoterol (PERFOROMIST) 20 MCG/2ML nebulizer solution Take 2 mLs (20 mcg total) by nebulization 2 (two) times daily. 120 mL 5  . hydrocortisone (ANUSOL-HC) 25 MG suppository Use 1 suppository per rectum as needed. 20 suppository 1  . KLOR-CON M10 10 MEQ tablet TAKE 1 TABLET TWICE A DAY 180 tablet 1  . metFORMIN (GLUCOPHAGE) 500 MG tablet Take 0.5-1 tablets (250-500 mg total) by mouth daily with breakfast. 90 tablet 1  . montelukast (SINGULAIR) 10 MG tablet Take 10 mg by mouth at bedtime.    . Multiple Vitamins-Minerals (OCUVITE PRESERVISION) TABS Take by mouth 2 (two) times daily.    . ondansetron (  ZOFRAN) 4 MG tablet Take 1 tablet (4 mg total) by mouth every 6 (six) hours as needed for nausea or vomiting. 60 tablet 0  . pantoprazole (PROTONIX) 40 MG tablet Take 1 tab by mouth every morning. 90 tablet 3  . phenytoin (DILANTIN) 100 MG ER capsule TAKE TWO CAPSULES BY MOUTH AT BEDTIME DAILY (Patient taking differently: one capsule twice daily) 60 capsule 3  . Polyethyl Glycol-Propyl Glycol (SYSTANE) 0.4-0.3 % SOLN Place 1 drop into both eyes 2 (two) times daily as needed.    . saccharomyces boulardii (FLORASTOR) 250 MG capsule Take 250 mg by mouth daily.    . sodium chloride (OCEAN) 0.65 % SOLN nasal spray Place 2 sprays into both nostrils daily.     No current facility-administered medications on file prior to visit.    Past Surgical History  Procedure Laterality Date  . Carotid endarterectomy    . Colectomy      right hemi  . Cholecystectomy   11/09  . Upper gastrointestinal endoscopy    . Tonsillectomy    . Appendectomy      Allergies  Allergen Reactions  . Bactrim [Sulfamethoxazole-Trimethoprim] Shortness Of Breath  . Levofloxacin Other (See Comments)    seizure  . Pneumovax [Pneumococcal Polysaccharide Vaccine] Other (See Comments)    Severe allergy-cellulitis  . Amoxicillin-Pot Clavulanate Other (See Comments)    Patient stated Drs. recommend taking Augmentin due to her PCN allergy.  . Penicillins Rash  . Bupropion   . Haldol [Haloperidol Lactate]     Had unknown reaction many yrs ago per pt  . Haloperidol   . Morphine   . Morphine And Related     Confused and agitation  . Pneumococcal Vaccine   . Pseudoephedrine Hcl   . Sudafed [Pseudoephedrine Hcl] Other (See Comments)    seizure    Social History   Social History  . Marital Status: Widowed    Spouse Name: N/A  . Number of Children: 3  . Years of Education: N/A   Occupational History  . retired   .     Social History Main Topics  . Smoking status: Light Tobacco Smoker -- 0.50 packs/day for 70 years    Types: Cigarettes    Start date: 02/14/1951  . Smokeless tobacco: Never Used     Comment: smokes 1-2 cigarettes daily  . Alcohol Use: No  . Drug Use: No  . Sexual Activity: No   Other Topics Concern  . Not on file   Social History Narrative   Patient signed a Designated Party Release to allow her daughter, Bobi Kleinberg, to have access to her medical records/information. Entered by Fleet Contras March 24,2011 @ 2:47 pm   Dailly Caffeine Use    Family History  Problem Relation Age of Onset  . Colon cancer Paternal Grandmother   . Esophageal cancer Brother   . Heart disease Mother   . Diabetes Brother   . Hypertension Father   . Rectal cancer Neg Hx   . Stomach cancer Neg Hx     BP 147/67 mmHg  Pulse 111  Ht 5\' 6"  (1.676 m)  Wt 125 lb (56.7 kg)  BMI 20.19 kg/m2  Review of Systems: See HPI above.    Objective:  Physical  Exam:  Gen: NAD  Right shoulder: No swelling, ecchymoses.  No gross deformity. TTP anterior over shoulder joint and upper arm.  No AC tenderness or other tenderness. FROM with painful arc. Positive Hawkins, Neers. Negative Speeds, Yergasons. Strength 5/5  with empty can and resisted internal/external rotation.  Mild pain empty can and ER. NV intact distally.  Left shoulder: FROM without pain.    Assessment & Plan:  1. Right shoulder pain - independently reviewed radiographs - no bony abnormalities though she does have some glenohumeral DJD.  Pain due to both this and rotator cuff impingement.  Given combination injection.  Shown ROM exercises.  Norco as needed for severe pain.  F/u in 1 month (2 weeks if struggling).  After informed written consent, patient was seated on exam table. Right shoulder was prepped with alcohol swab and utilizing posterior approach, patient's right shoulder was injected with 6:2 marcaine:depomedrol with half in the subacromial space and half in glenohumeral space.  Patient tolerated the procedure well without immediate complications.

## 2015-11-11 ENCOUNTER — Ambulatory Visit (HOSPITAL_BASED_OUTPATIENT_CLINIC_OR_DEPARTMENT_OTHER): Payer: Medicare Other

## 2015-11-11 ENCOUNTER — Other Ambulatory Visit (HOSPITAL_BASED_OUTPATIENT_CLINIC_OR_DEPARTMENT_OTHER): Payer: Medicare Other

## 2015-11-11 ENCOUNTER — Encounter: Payer: Self-pay | Admitting: Family

## 2015-11-11 ENCOUNTER — Ambulatory Visit (HOSPITAL_BASED_OUTPATIENT_CLINIC_OR_DEPARTMENT_OTHER): Payer: Medicare Other | Admitting: Family

## 2015-11-11 VITALS — Ht 66.0 in | Wt 120.0 lb

## 2015-11-11 DIAGNOSIS — Z85038 Personal history of other malignant neoplasm of large intestine: Secondary | ICD-10-CM

## 2015-11-11 DIAGNOSIS — D500 Iron deficiency anemia secondary to blood loss (chronic): Secondary | ICD-10-CM

## 2015-11-11 DIAGNOSIS — D509 Iron deficiency anemia, unspecified: Secondary | ICD-10-CM

## 2015-11-11 DIAGNOSIS — K649 Unspecified hemorrhoids: Secondary | ICD-10-CM

## 2015-11-11 DIAGNOSIS — K641 Second degree hemorrhoids: Secondary | ICD-10-CM

## 2015-11-11 LAB — IRON AND TIBC
%SAT: 9 % — ABNORMAL LOW (ref 21–57)
Iron: 29 ug/dL — ABNORMAL LOW (ref 41–142)
TIBC: 338 ug/dL (ref 236–444)
UIBC: 309 ug/dL (ref 120–384)

## 2015-11-11 LAB — CBC WITH DIFFERENTIAL (CANCER CENTER ONLY)
BASO#: 0 10*3/uL (ref 0.0–0.2)
BASO%: 0.4 % (ref 0.0–2.0)
EOS ABS: 0.2 10*3/uL (ref 0.0–0.5)
EOS%: 4.3 % (ref 0.0–7.0)
HEMATOCRIT: 32.2 % — AB (ref 34.8–46.6)
HEMOGLOBIN: 9.9 g/dL — AB (ref 11.6–15.9)
LYMPH#: 0.9 10*3/uL (ref 0.9–3.3)
LYMPH%: 17.5 % (ref 14.0–48.0)
MCH: 26.1 pg (ref 26.0–34.0)
MCHC: 30.7 g/dL — AB (ref 32.0–36.0)
MCV: 85 fL (ref 81–101)
MONO#: 0.6 10*3/uL (ref 0.1–0.9)
MONO%: 10.5 % (ref 0.0–13.0)
NEUT%: 67.3 % (ref 39.6–80.0)
NEUTROS ABS: 3.6 10*3/uL (ref 1.5–6.5)
Platelets: 89 10*3/uL — ABNORMAL LOW (ref 145–400)
RBC: 3.8 10*6/uL (ref 3.70–5.32)
RDW: 15 % (ref 11.1–15.7)
WBC: 5.3 10*3/uL (ref 3.9–10.0)

## 2015-11-11 LAB — COMPREHENSIVE METABOLIC PANEL
ALBUMIN: 3.2 g/dL — AB (ref 3.5–5.0)
ALK PHOS: 92 U/L (ref 40–150)
ALT: 19 U/L (ref 0–55)
AST: 45 U/L — ABNORMAL HIGH (ref 5–34)
Anion Gap: 9 mEq/L (ref 3–11)
BILIRUBIN TOTAL: 0.48 mg/dL (ref 0.20–1.20)
BUN: 10.2 mg/dL (ref 7.0–26.0)
CALCIUM: 8.7 mg/dL (ref 8.4–10.4)
CO2: 26 mEq/L (ref 22–29)
Chloride: 107 mEq/L (ref 98–109)
Creatinine: 0.8 mg/dL (ref 0.6–1.1)
EGFR: 66 mL/min/{1.73_m2} — AB (ref >90)
GLUCOSE: 196 mg/dL — AB (ref 70–140)
POTASSIUM: 3.5 meq/L (ref 3.5–5.1)
Sodium: 142 mEq/L (ref 136–145)
TOTAL PROTEIN: 6.9 g/dL (ref 6.4–8.3)

## 2015-11-11 LAB — LACTATE DEHYDROGENASE: LDH: 219 U/L (ref 125–245)

## 2015-11-11 MED ORDER — SODIUM CHLORIDE 0.9 % IV SOLN
Freq: Once | INTRAVENOUS | Status: AC
  Administered 2015-11-11: 15:00:00 via INTRAVENOUS

## 2015-11-11 MED ORDER — SODIUM CHLORIDE 0.9 % IV SOLN
510.0000 mg | Freq: Once | INTRAVENOUS | Status: AC
  Administered 2015-11-11: 510 mg via INTRAVENOUS
  Filled 2015-11-11: qty 17

## 2015-11-11 NOTE — Progress Notes (Signed)
Hematology and Oncology Follow Up Visit  Monique Fleming [id number] 27-Oct-1928 82 y.o. 11/11/2015   Principle Diagnosis:  1. Stage I (T1 N0 M0) adenocarcinoma of the right colon 2. Intermittent iron deficiency anemia secondary to chronic blood loss  Current Therapy:   Observation    Interim History: Monique Fleming comes is here today for a follow-up. She is feeling fatigued at this time. She received Feraheme in December. Her iron saturation at that time was 13% with a ferritin of 68. Her hemoglobin today is 9.9 and her MCV is slightly down at 85.  She has had a good deal of pain in the right shoulder. This is keeping her up at night because of this. an x-ray revealed a rotator cuff impingement. She did see Dr. Barbaraann Barthel this morning and received a combination injection in that shoulder.  No numbness or tingling in her extremities at this time. She also states that her appetite has been down due to the pain.  She is staying well-hydrated. Her weight is down 4 pounds this visit.  she is still smoking 1-2 cigarettes a day. Her shortness of breath with exertion is unchanged. She is wearing 2 L supplemental O2 via nasal cannula at night. No fever, chills, n/v, cough, rash, headache, dizziness, chest pain, abdominal pain, changes in bowel or bladder habits.  She does have some urinary urgency at times but this is "normal" for her. She has had no syncopal episodes or falls. No episodes of bleeding or bruising. No lymphadenopathy found on exam.  Her daughter is bidding on a house today that has an extra bedroom for her. She plans to stay with her daughter for a while until she starts feeling a little better and then will hopefully be able to go back to her condo.  Medications:    Medication List       This list is accurate as of: 11/11/15  2:09 PM.  Always use your most recent med list.               albuterol 108 (90 Base) MCG/ACT inhaler  Commonly known as:  PROAIR HFA  Inhale 2 puffs into the  lungs every 4 (four) hours as needed for wheezing or shortness of breath.     cholestyramine 4 g packet  Commonly known as:  QUESTRAN  Take 1 packet (4 g total) by mouth daily as needed (diarrhea).     clonazePAM 1 MG tablet  Commonly known as:  KLONOPIN  Take 1 mg by mouth 2 (two) times daily as needed for anxiety.     clopidogrel 75 MG tablet  Commonly known as:  PLAVIX  TAKE 1 TABLET DAILY     desvenlafaxine 100 MG 24 hr tablet  Commonly known as:  PRISTIQ  Take 100 mg by mouth daily.     ergocalciferol 50000 units capsule  Commonly known as:  VITAMIN D2  Take 1 capsule (50,000 Units total) by mouth once a week.     fluticasone 50 MCG/ACT nasal spray  Commonly known as:  FLONASE  USE 2 SPRAYS IN EACH NOSTRIL DAILY AS NEEDED FOR ALLERGIES     formoterol 20 MCG/2ML nebulizer solution  Commonly known as:  PERFOROMIST  Take 2 mLs (20 mcg total) by nebulization 2 (two) times daily.     HYDROcodone-acetaminophen 5-325 MG tablet  Commonly known as:  NORCO  Take 1 tablet by mouth every 6 (six) hours as needed for moderate pain.     hydrocortisone 25 MG  suppository  Commonly known as:  ANUSOL-HC  Use 1 suppository per rectum as needed.     KLOR-CON M10 10 MEQ tablet  Generic drug:  potassium chloride  TAKE 1 TABLET TWICE A DAY     metFORMIN 500 MG tablet  Commonly known as:  GLUCOPHAGE  Take 0.5-1 tablets (250-500 mg total) by mouth daily with breakfast.     montelukast 10 MG tablet  Commonly known as:  SINGULAIR  Take 10 mg by mouth at bedtime.     OCUVITE PRESERVISION Tabs  Take by mouth 2 (two) times daily.     ondansetron 4 MG tablet  Commonly known as:  ZOFRAN  Take 1 tablet (4 mg total) by mouth every 6 (six) hours as needed for nausea or vomiting.     pantoprazole 40 MG tablet  Commonly known as:  PROTONIX  Take 1 tab by mouth every morning.     phenytoin 100 MG ER capsule  Commonly known as:  DILANTIN  TAKE TWO CAPSULES BY MOUTH AT BEDTIME DAILY      saccharomyces boulardii 250 MG capsule  Commonly known as:  FLORASTOR  Take 250 mg by mouth daily.     sodium chloride 0.65 % Soln nasal spray  Commonly known as:  OCEAN  Place 2 sprays into both nostrils daily.     SYSTANE 0.4-0.3 % Soln  Generic drug:  Polyethyl Glycol-Propyl Glycol  Place 1 drop into both eyes 2 (two) times daily as needed.     Vitamin D 2000 units Caps  Take by mouth daily.        Allergies:  Allergies  Allergen Reactions  . Bactrim [Sulfamethoxazole-Trimethoprim] Shortness Of Breath  . Levofloxacin Other (See Comments)    seizure  . Pneumovax [Pneumococcal Polysaccharide Vaccine] Other (See Comments)    Severe allergy-cellulitis  . Amoxicillin-Pot Clavulanate Other (See Comments)    Patient stated Drs. recommend taking Augmentin due to her PCN allergy.  . Penicillins Rash  . Bupropion   . Haldol [Haloperidol Lactate]     Had unknown reaction many yrs ago per pt  . Haloperidol   . Morphine   . Morphine And Related     Confused and agitation  . Pneumococcal Vaccine   . Pseudoephedrine Hcl   . Sudafed [Pseudoephedrine Hcl] Other (See Comments)    seizure    Past Medical History, Surgical history, Social history, and Family History were reviewed and updated.  Review of Systems: All other 10 point review of systems is negative.   Physical Exam:  height is 5\' 6"  (1.676 m) and weight is 120 lb (54.432 kg).   Wt Readings from Last 3 Encounters:  11/11/15 120 lb (54.432 kg)  11/05/15 125 lb (56.7 kg)  09/10/15 124 lb (56.246 kg)    Ocular: Sclerae unicteric, pupils equal, round and reactive to light Ear-nose-throat: Oropharynx clear, dentition fair Lymphatic: No cervical supraclavicular or axillary adenopathy Lungs no rales or rhonchi, good excursion bilaterally Heart regular rate and rhythm, no murmur appreciated Abd soft, nontender, positive bowel sounds, newly liver or spleen tip palpated on exam MSK no focal spinal tenderness, no joint  edema Neuro: non-focal, well-oriented, appropriate affect Breasts: Deferred  Lab Results  Component Value Date   WBC 5.3 11/11/2015   HGB 9.9* 11/11/2015   HCT 32.2* 11/11/2015   MCV 85 11/11/2015   PLT 89* 11/11/2015   Lab Results  Component Value Date   FERRITIN 68 09/10/2015   IRON 28* 09/10/2015   TIBC 214*  09/10/2015   UIBC 186 09/10/2015   IRONPCTSAT 13* 09/10/2015   Lab Results  Component Value Date   RETICCTPCT 1.4 07/25/2015   RBC 3.80 11/11/2015   RETICCTABS 55.3 07/25/2015   No results found for: Nils Pyle [facility] Lab Results  Component Value Date   IGA 507* 08/23/2014   No results found for: Odetta Pink, SPEI   Chemistry      Component Value Date/Time   NA 134 09/10/2015 1054   NA 146* 07/25/2015 0822   NA 142 05/25/2015 1845   K 3.1* 09/10/2015 1054   K 3.7 07/25/2015 0822   K 3.7 05/25/2015 1845   CL 100 09/10/2015 1054   CL 109 05/25/2015 1845   CO2 26 09/10/2015 1054   CO2 31* 07/25/2015 0822   CO2 28 05/25/2015 1845   BUN 7 09/10/2015 1054   BUN 11.5 07/25/2015 0822   BUN 8 05/25/2015 1845   CREATININE 0.7 09/10/2015 1054   CREATININE 0.8 07/25/2015 0822   CREATININE 0.63 05/25/2015 1845      Component Value Date/Time   CALCIUM 8.2 09/10/2015 1054   CALCIUM 8.5 07/25/2015 0822   CALCIUM 8.2* 05/25/2015 1845   ALKPHOS 100* 09/10/2015 1054   ALKPHOS 92 07/25/2015 0822   ALKPHOS 103 05/25/2015 1845   AST 34 09/10/2015 1054   AST 35* 07/25/2015 0822   AST 42* 05/25/2015 1845   ALT 13 09/10/2015 1054   ALT 13 07/25/2015 0822   ALT 14 05/25/2015 1845   BILITOT 0.80 09/10/2015 1054   BILITOT 0.50 07/25/2015 0822   BILITOT 0.6 05/25/2015 1845     Impression and Plan: Ms. Faunce is a 82 year old female with a history of stage I adenocarcinoma of the right colon with resection in May 2008. So far, there has been no evidence of recurrence.  She has intermittent iron  deficiency anemia due to bleeding internal and external hemorrhoids.  She last received Feraheme in December. Her iron saturation at that time was 13%. Her hemoglobin today is 9.9 with an MCV of 85. She is symptomatic with fatigue at this time.  We will go ahead and give her a dose of ferriheme today while she is here in our office. We will see what her iron studies showing if we need to give her second dose in 8 days. We will plan to see her back in 2 months for labs and follow-up.  She will contact us with any questions or concerns. We can certainly see her sooner if need be.   Eliezer Bottom, NP 2/6/20172:09 PM

## 2015-11-11 NOTE — Patient Instructions (Signed)

## 2015-11-12 LAB — RETICULOCYTES: RETICULOCYTE COUNT: 1.9 % (ref 0.6–2.6)

## 2015-11-12 LAB — ERYTHROPOIETIN: Erythropoietin: 39.5 m[IU]/mL — ABNORMAL HIGH (ref 2.6–18.5)

## 2015-11-14 ENCOUNTER — Telehealth: Payer: Self-pay | Admitting: *Deleted

## 2015-11-14 ENCOUNTER — Other Ambulatory Visit: Payer: Self-pay | Admitting: *Deleted

## 2015-11-14 ENCOUNTER — Encounter: Payer: Self-pay | Admitting: Hematology & Oncology

## 2015-11-14 DIAGNOSIS — D500 Iron deficiency anemia secondary to blood loss (chronic): Secondary | ICD-10-CM

## 2015-11-14 NOTE — Telephone Encounter (Addendum)
Patient aware of results. Appointment made.   ----- Message from Eliezer Bottom, NP sent at 11/11/2015  5:17 PM EST ----- Regarding: iron She will need a second dose of Feraheme in 8 days please. Thank you!  Sarah  ----- Message -----    From: [facility] Interface    Sent: 11/11/2015   1:30 PM      To: Eliezer Bottom, NP

## 2015-11-15 ENCOUNTER — Encounter (HOSPITAL_COMMUNITY): Payer: Self-pay | Admitting: Emergency Medicine

## 2015-11-15 ENCOUNTER — Emergency Department (HOSPITAL_COMMUNITY)
Admission: EM | Admit: 2015-11-15 | Discharge: 2015-11-15 | Disposition: A | Discharge status: Home / Self Care | Payer: No Typology Code available for payment source | Source: Home / Self Care

## 2015-11-15 ENCOUNTER — Other Ambulatory Visit: Payer: Self-pay | Admitting: Family

## 2015-11-15 DIAGNOSIS — F329 Major depressive disorder, single episode, unspecified: Secondary | ICD-10-CM | POA: Insufficient documentation

## 2015-11-15 DIAGNOSIS — Y998 Other external cause status: Secondary | ICD-10-CM | POA: Insufficient documentation

## 2015-11-15 DIAGNOSIS — Z79899 Other long term (current) drug therapy: Secondary | ICD-10-CM | POA: Diagnosis not present

## 2015-11-15 DIAGNOSIS — K219 Gastro-esophageal reflux disease without esophagitis: Secondary | ICD-10-CM | POA: Diagnosis not present

## 2015-11-15 DIAGNOSIS — Y9389 Activity, other specified: Secondary | ICD-10-CM | POA: Insufficient documentation

## 2015-11-15 DIAGNOSIS — J441 Chronic obstructive pulmonary disease with (acute) exacerbation: Secondary | ICD-10-CM | POA: Diagnosis not present

## 2015-11-15 DIAGNOSIS — Y92410 Unspecified street and highway as the place of occurrence of the external cause: Secondary | ICD-10-CM | POA: Insufficient documentation

## 2015-11-15 DIAGNOSIS — Z872 Personal history of diseases of the skin and subcutaneous tissue: Secondary | ICD-10-CM | POA: Insufficient documentation

## 2015-11-15 DIAGNOSIS — Z862 Personal history of diseases of the blood and blood-forming organs and certain disorders involving the immune mechanism: Secondary | ICD-10-CM | POA: Insufficient documentation

## 2015-11-15 DIAGNOSIS — F419 Anxiety disorder, unspecified: Secondary | ICD-10-CM | POA: Diagnosis not present

## 2015-11-15 DIAGNOSIS — F17210 Nicotine dependence, cigarettes, uncomplicated: Secondary | ICD-10-CM

## 2015-11-15 DIAGNOSIS — E1159 Type 2 diabetes mellitus with other circulatory complications: Secondary | ICD-10-CM

## 2015-11-15 DIAGNOSIS — Z7951 Long term (current) use of inhaled steroids: Secondary | ICD-10-CM | POA: Insufficient documentation

## 2015-11-15 DIAGNOSIS — S3992XA Unspecified injury of lower back, initial encounter: Secondary | ICD-10-CM

## 2015-11-15 DIAGNOSIS — Z880 Allergy status to penicillin: Secondary | ICD-10-CM | POA: Insufficient documentation

## 2015-11-15 DIAGNOSIS — Z8619 Personal history of other infectious and parasitic diseases: Secondary | ICD-10-CM | POA: Insufficient documentation

## 2015-11-15 DIAGNOSIS — E119 Type 2 diabetes mellitus without complications: Secondary | ICD-10-CM | POA: Insufficient documentation

## 2015-11-15 DIAGNOSIS — J449 Chronic obstructive pulmonary disease, unspecified: Secondary | ICD-10-CM

## 2015-11-15 DIAGNOSIS — Z8669 Personal history of other diseases of the nervous system and sense organs: Secondary | ICD-10-CM | POA: Insufficient documentation

## 2015-11-15 DIAGNOSIS — Q6100 Congenital renal cyst, unspecified: Secondary | ICD-10-CM | POA: Insufficient documentation

## 2015-11-15 DIAGNOSIS — Z8673 Personal history of transient ischemic attack (TIA), and cerebral infarction without residual deficits: Secondary | ICD-10-CM | POA: Insufficient documentation

## 2015-11-15 DIAGNOSIS — I209 Angina pectoris, unspecified: Secondary | ICD-10-CM | POA: Insufficient documentation

## 2015-11-15 DIAGNOSIS — S39012A Strain of muscle, fascia and tendon of lower back, initial encounter: Secondary | ICD-10-CM | POA: Diagnosis not present
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
## 2015-11-15 NOTE — ED Notes (Signed)
Called pt for triage no answer from lobby

## 2015-11-16 ENCOUNTER — Encounter (HOSPITAL_BASED_OUTPATIENT_CLINIC_OR_DEPARTMENT_OTHER): Payer: Self-pay | Admitting: Emergency Medicine

## 2015-11-16 ENCOUNTER — Other Ambulatory Visit: Payer: Self-pay | Admitting: Family Medicine

## 2015-11-16 ENCOUNTER — Emergency Department (HOSPITAL_BASED_OUTPATIENT_CLINIC_OR_DEPARTMENT_OTHER)
Admission: EM | Admit: 2015-11-16 | Discharge: 2015-11-16 | Disposition: A | Discharge status: Home / Self Care | Payer: No Typology Code available for payment source | Attending: Emergency Medicine | Admitting: Emergency Medicine

## 2015-11-16 ENCOUNTER — Emergency Department (HOSPITAL_BASED_OUTPATIENT_CLINIC_OR_DEPARTMENT_OTHER): Payer: No Typology Code available for payment source

## 2015-11-16 DIAGNOSIS — S39012A Strain of muscle, fascia and tendon of lower back, initial encounter: Secondary | ICD-10-CM

## 2015-11-16 MED ORDER — ALBUTEROL SULFATE (2.5 MG/3ML) 0.083% IN NEBU
INHALATION_SOLUTION | RESPIRATORY_TRACT | Status: AC
  Filled 2015-11-16: qty 3

## 2015-11-16 MED ORDER — ALBUTEROL SULFATE (2.5 MG/3ML) 0.083% IN NEBU
5.0000 mg | INHALATION_SOLUTION | Freq: Once | RESPIRATORY_TRACT | Status: AC
  Administered 2015-11-16: 5 mg via RESPIRATORY_TRACT
  Filled 2015-11-16: qty 6

## 2015-11-16 MED ORDER — ALBUTEROL SULFATE (2.5 MG/3ML) 0.083% IN NEBU: 2.5000 mg | INHALATION_SOLUTION | Freq: Once | RESPIRATORY_TRACT | Status: DC

## 2015-11-16 MED ORDER — HYDROCODONE-ACETAMINOPHEN 5-325 MG PO TABS
1.0000 | ORAL_TABLET | Freq: Once | ORAL | Status: AC
  Administered 2015-11-16: 1 via ORAL
  Filled 2015-11-16: qty 1

## 2015-11-16 NOTE — ED Notes (Signed)
Pt was seen at [facility] ED but left without being seen. Had been waiting to be seen since 6pm per patient.
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
## 2015-11-18 ENCOUNTER — Ambulatory Visit (HOSPITAL_BASED_OUTPATIENT_CLINIC_OR_DEPARTMENT_OTHER): Payer: Medicare Other

## 2015-11-18 VITALS — BP 168/58 | HR 89 | Temp 98.0°F | Resp 20

## 2015-11-18 DIAGNOSIS — K641 Second degree hemorrhoids: Secondary | ICD-10-CM

## 2015-11-18 DIAGNOSIS — D500 Iron deficiency anemia secondary to blood loss (chronic): Secondary | ICD-10-CM | POA: Diagnosis present

## 2015-11-18 MED ORDER — SODIUM CHLORIDE 0.9 % IV SOLN
Freq: Once | INTRAVENOUS | Status: AC
  Administered 2015-11-18: 14:00:00 via INTRAVENOUS

## 2015-11-18 MED ORDER — FERUMOXYTOL INJECTION 510 MG/17 ML
510.0000 mg | Freq: Once | INTRAVENOUS | Status: AC
Start: 2015-11-18 — End: 2015-11-18
  Administered 2015-11-18: 510 mg via INTRAVENOUS
  Filled 2015-11-18: qty 17

## 2015-11-18 NOTE — Patient Instructions (Signed)

## 2015-11-18 NOTE — Telephone Encounter (Signed)
Faxed hardcopy for tramadol To [facility].

## 2015-11-20 ENCOUNTER — Other Ambulatory Visit: Payer: Self-pay | Admitting: Family Medicine

## 2015-12-05 ENCOUNTER — Encounter: Payer: Self-pay | Admitting: Pulmonary Disease

## 2015-12-05 ENCOUNTER — Ambulatory Visit (INDEPENDENT_AMBULATORY_CARE_PROVIDER_SITE_OTHER): Payer: Medicare Other | Admitting: Pulmonary Disease

## 2015-12-05 VITALS — BP 162/84 | HR 102 | Ht 65.5 in | Wt 117.0 lb

## 2015-12-05 DIAGNOSIS — J449 Chronic obstructive pulmonary disease, unspecified: Secondary | ICD-10-CM | POA: Diagnosis not present

## 2015-12-05 DIAGNOSIS — J9611 Chronic respiratory failure with hypoxia: Secondary | ICD-10-CM

## 2015-12-05 NOTE — Patient Instructions (Addendum)
COPD appears stable Stay on Perforomist

## 2015-12-05 NOTE — Assessment & Plan Note (Signed)
Ct noct O2 

## 2015-12-05 NOTE — Progress Notes (Signed)
   Subjective:    Patient ID: Monique Fleming, female    DOB: [birth year], 82 y.o.   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 Follow-up    breathing has been doing well. cough has subsided. CAT Score: 21    Lost 8 lbs over last 5 months -she has history of colon cancer BP high today Smokes 1-2 cigs/day Compliant with perforomist twice daily Sleeps with O2 CXR 05/2015 - hyperinflation CT angio 09/2013 ne, emphysema    She was diagnosed with macular degeneration and is receiving  Eye injections.   Significant tests/ events  7/2/2015Spiro: Fev1 59% FeV1/FVC 54%   ONO 09/21/2014 RA:  sats low on repeated occasions   Review of Systems Patient denies significant dyspnea,cough, hemoptysis,  chest pain, palpitations, pedal edema, orthopnea, paroxysmal nocturnal dyspnea, lightheadedness, nausea, vomiting, abdominal or  leg pains      Objective:   Physical Exam  Gen. Pleasant, thin woman, in no distress ENT - no lesions, no post nasal drip Neck: No JVD, no thyromegaly, no carotid bruits Lungs: no use of accessory muscles, no dullness to percussion, clear without rales or rhonchi  Cardiovascular: Rhythm regular, heart sounds  normal, no murmurs or gallops, no peripheral edema Musculoskeletal: No deformities, no cyanosis or clubbing         Assessment & Plan:

## 2015-12-05 NOTE — Assessment & Plan Note (Addendum)
COPD appears stable Stay on Perforomist If further weight loss, consider repeating CT chest and abdomen

## 2015-12-09 ENCOUNTER — Other Ambulatory Visit: Payer: Medicare Other

## 2015-12-16 ENCOUNTER — Other Ambulatory Visit: Payer: Self-pay | Admitting: *Deleted

## 2015-12-16 ENCOUNTER — Other Ambulatory Visit: Payer: Self-pay | Admitting: Family Medicine

## 2015-12-16 DIAGNOSIS — D500 Iron deficiency anemia secondary to blood loss (chronic): Secondary | ICD-10-CM

## 2015-12-17 ENCOUNTER — Other Ambulatory Visit: Payer: Medicare Other

## 2015-12-19 ENCOUNTER — Other Ambulatory Visit: Payer: Medicare Other

## 2015-12-23 ENCOUNTER — Telehealth: Payer: Self-pay | Admitting: Pulmonary Disease

## 2015-12-23 NOTE — Telephone Encounter (Signed)
Called and spoke to pt. Pt requesting nasal cannula for O2. Advised pt to contact home care company and request the cannulas. DME company is [facility], pt aware. [facility] phone number given. Pt verbalized understanding and denied any further questions or concerns at this time.

## 2015-12-26 ENCOUNTER — Other Ambulatory Visit (HOSPITAL_BASED_OUTPATIENT_CLINIC_OR_DEPARTMENT_OTHER): Payer: Medicare Other

## 2015-12-26 DIAGNOSIS — D500 Iron deficiency anemia secondary to blood loss (chronic): Secondary | ICD-10-CM | POA: Diagnosis present

## 2015-12-26 LAB — CBC WITH DIFFERENTIAL (CANCER CENTER ONLY)
BASO#: 0 10*3/uL (ref 0.0–0.2)
BASO%: 0.6 % (ref 0.0–2.0)
EOS ABS: 0.3 10*3/uL (ref 0.0–0.5)
EOS%: 5.3 % (ref 0.0–7.0)
HCT: 35.2 % (ref 34.8–46.6)
HEMOGLOBIN: 11.4 g/dL — AB (ref 11.6–15.9)
LYMPH#: 1 10*3/uL (ref 0.9–3.3)
LYMPH%: 18.7 % (ref 14.0–48.0)
MCH: 28.4 pg (ref 26.0–34.0)
MCHC: 32.4 g/dL (ref 32.0–36.0)
MCV: 88 fL (ref 81–101)
MONO#: 0.5 10*3/uL (ref 0.1–0.9)
MONO%: 9.5 % (ref 0.0–13.0)
NEUT%: 65.9 % (ref 39.6–80.0)
NEUTROS ABS: 3.5 10*3/uL (ref 1.5–6.5)
Platelets: 119 10*3/uL — ABNORMAL LOW (ref 145–400)
RBC: 4.01 10*6/uL (ref 3.70–5.32)
RDW: 18.2 % — ABNORMAL HIGH (ref 11.1–15.7)
WBC: 5.2 10*3/uL (ref 3.9–10.0)

## 2015-12-27 LAB — IRON AND TIBC
%SAT: 19 % — AB (ref 21–57)
IRON: 40 ug/dL — AB (ref 41–142)
TIBC: 215 ug/dL — AB (ref 236–444)
UIBC: 175 ug/dL (ref 120–384)

## 2015-12-27 LAB — FERRITIN: FERRITIN: 96 ng/mL (ref 9–269)

## 2015-12-27 LAB — RETICULOCYTES: Reticulocyte Count: 2.6 % (ref 0.6–2.6)

## 2015-12-31 ENCOUNTER — Telehealth: Payer: Self-pay | Admitting: *Deleted

## 2015-12-31 NOTE — Telephone Encounter (Signed)
Patient tired and she would like to know what her iron studies showed from last week. Reviewed labs with Dr Marin Olp and he wants patient to come in for one dose of feraheme to help with her fatigue. Patient aware and appointment made.

## 2016-01-02 ENCOUNTER — Ambulatory Visit (HOSPITAL_BASED_OUTPATIENT_CLINIC_OR_DEPARTMENT_OTHER): Payer: Medicare Other

## 2016-01-02 VITALS — BP 145/51 | HR 78 | Temp 98.1°F | Resp 16

## 2016-01-02 DIAGNOSIS — D500 Iron deficiency anemia secondary to blood loss (chronic): Secondary | ICD-10-CM

## 2016-01-02 MED ORDER — SODIUM CHLORIDE 0.9 % IV SOLN
INTRAVENOUS | Status: DC
  Administered 2016-01-02: 15:00:00 via INTRAVENOUS

## 2016-01-02 MED ORDER — SODIUM CHLORIDE 0.9 % IV SOLN
510.0000 mg | Freq: Once | INTRAVENOUS | Status: AC
  Administered 2016-01-02: 510 mg via INTRAVENOUS
  Filled 2016-01-02: qty 17

## 2016-01-02 NOTE — Patient Instructions (Signed)

## 2016-01-09 ENCOUNTER — Encounter: Payer: Self-pay | Admitting: Family Medicine

## 2016-01-13 ENCOUNTER — Other Ambulatory Visit: Payer: Medicare Other

## 2016-01-13 ENCOUNTER — Ambulatory Visit: Payer: Medicare Other | Admitting: Family

## 2016-01-13 ENCOUNTER — Ambulatory Visit: Payer: Medicare Other

## 2016-01-18 ENCOUNTER — Encounter (HOSPITAL_COMMUNITY): Payer: Self-pay | Admitting: Emergency Medicine

## 2016-01-18 ENCOUNTER — Emergency Department (HOSPITAL_COMMUNITY): Payer: Medicare Other

## 2016-01-18 ENCOUNTER — Inpatient Hospital Stay (HOSPITAL_COMMUNITY)
Admission: EM | Admit: 2016-01-18 | Discharge: 2016-01-23 | DRG: 071 | Disposition: A | Discharge status: Skilled Nursing Facility | Payer: Medicare Other | Attending: Internal Medicine | Admitting: Internal Medicine

## 2016-01-18 DIAGNOSIS — I4581 Long QT syndrome: Secondary | ICD-10-CM | POA: Diagnosis present

## 2016-01-18 DIAGNOSIS — R471 Dysarthria and anarthria: Secondary | ICD-10-CM | POA: Diagnosis present

## 2016-01-18 DIAGNOSIS — E119 Type 2 diabetes mellitus without complications: Secondary | ICD-10-CM | POA: Diagnosis not present

## 2016-01-18 DIAGNOSIS — F329 Major depressive disorder, single episode, unspecified: Secondary | ICD-10-CM | POA: Diagnosis present

## 2016-01-18 DIAGNOSIS — Z9049 Acquired absence of other specified parts of digestive tract: Secondary | ICD-10-CM

## 2016-01-18 DIAGNOSIS — E1159 Type 2 diabetes mellitus with other circulatory complications: Secondary | ICD-10-CM | POA: Diagnosis present

## 2016-01-18 DIAGNOSIS — E440 Moderate protein-calorie malnutrition: Secondary | ICD-10-CM | POA: Diagnosis present

## 2016-01-18 DIAGNOSIS — G9340 Encephalopathy, unspecified: Secondary | ICD-10-CM | POA: Diagnosis present

## 2016-01-18 DIAGNOSIS — R4182 Altered mental status, unspecified: Secondary | ICD-10-CM

## 2016-01-18 DIAGNOSIS — Z7984 Long term (current) use of oral hypoglycemic drugs: Secondary | ICD-10-CM

## 2016-01-18 DIAGNOSIS — G40909 Epilepsy, unspecified, not intractable, without status epilepticus: Secondary | ICD-10-CM | POA: Diagnosis present

## 2016-01-18 DIAGNOSIS — I639 Cerebral infarction, unspecified: Secondary | ICD-10-CM

## 2016-01-18 DIAGNOSIS — D649 Anemia, unspecified: Secondary | ICD-10-CM

## 2016-01-18 DIAGNOSIS — J449 Chronic obstructive pulmonary disease, unspecified: Secondary | ICD-10-CM

## 2016-01-18 DIAGNOSIS — D509 Iron deficiency anemia, unspecified: Secondary | ICD-10-CM | POA: Diagnosis present

## 2016-01-18 DIAGNOSIS — R2681 Unsteadiness on feet: Secondary | ICD-10-CM | POA: Insufficient documentation

## 2016-01-18 DIAGNOSIS — J45909 Unspecified asthma, uncomplicated: Secondary | ICD-10-CM | POA: Diagnosis present

## 2016-01-18 DIAGNOSIS — F419 Anxiety disorder, unspecified: Secondary | ICD-10-CM | POA: Diagnosis present

## 2016-01-18 DIAGNOSIS — K117 Disturbances of salivary secretion: Secondary | ICD-10-CM | POA: Diagnosis present

## 2016-01-18 DIAGNOSIS — Z8673 Personal history of transient ischemic attack (TIA), and cerebral infarction without residual deficits: Secondary | ICD-10-CM

## 2016-01-18 DIAGNOSIS — R410 Disorientation, unspecified: Secondary | ICD-10-CM | POA: Diagnosis not present

## 2016-01-18 DIAGNOSIS — Z6822 Body mass index (BMI) 22.0-22.9, adult: Secondary | ICD-10-CM

## 2016-01-18 DIAGNOSIS — G9341 Metabolic encephalopathy: Secondary | ICD-10-CM | POA: Diagnosis not present

## 2016-01-18 DIAGNOSIS — K219 Gastro-esophageal reflux disease without esophagitis: Secondary | ICD-10-CM | POA: Diagnosis present

## 2016-01-18 DIAGNOSIS — R627 Adult failure to thrive: Secondary | ICD-10-CM | POA: Diagnosis present

## 2016-01-18 DIAGNOSIS — F418 Other specified anxiety disorders: Secondary | ICD-10-CM | POA: Insufficient documentation

## 2016-01-18 DIAGNOSIS — G4700 Insomnia, unspecified: Secondary | ICD-10-CM | POA: Diagnosis present

## 2016-01-18 DIAGNOSIS — I350 Nonrheumatic aortic (valve) stenosis: Secondary | ICD-10-CM | POA: Diagnosis present

## 2016-01-18 DIAGNOSIS — Z66 Do not resuscitate: Secondary | ICD-10-CM | POA: Diagnosis present

## 2016-01-18 DIAGNOSIS — Z7902 Long term (current) use of antithrombotics/antiplatelets: Secondary | ICD-10-CM

## 2016-01-18 DIAGNOSIS — Z833 Family history of diabetes mellitus: Secondary | ICD-10-CM

## 2016-01-18 DIAGNOSIS — D696 Thrombocytopenia, unspecified: Secondary | ICD-10-CM

## 2016-01-18 DIAGNOSIS — R9431 Abnormal electrocardiogram [ECG] [EKG]: Secondary | ICD-10-CM | POA: Diagnosis present

## 2016-01-18 DIAGNOSIS — R479 Unspecified speech disturbances: Secondary | ICD-10-CM | POA: Diagnosis present

## 2016-01-18 DIAGNOSIS — R569 Unspecified convulsions: Secondary | ICD-10-CM | POA: Insufficient documentation

## 2016-01-18 DIAGNOSIS — F17210 Nicotine dependence, cigarettes, uncomplicated: Secondary | ICD-10-CM | POA: Diagnosis present

## 2016-01-18 DIAGNOSIS — Z79899 Other long term (current) drug therapy: Secondary | ICD-10-CM

## 2016-01-18 DIAGNOSIS — J4489 Other specified chronic obstructive pulmonary disease: Secondary | ICD-10-CM | POA: Diagnosis present

## 2016-01-18 DIAGNOSIS — I739 Peripheral vascular disease, unspecified: Secondary | ICD-10-CM | POA: Diagnosis present

## 2016-01-18 DIAGNOSIS — Z8249 Family history of ischemic heart disease and other diseases of the circulatory system: Secondary | ICD-10-CM

## 2016-01-18 DIAGNOSIS — I6529 Occlusion and stenosis of unspecified carotid artery: Secondary | ICD-10-CM | POA: Diagnosis present

## 2016-01-18 LAB — URINALYSIS, ROUTINE W REFLEX MICROSCOPIC
Bilirubin Urine: NEGATIVE
GLUCOSE, UA: NEGATIVE mg/dL
HGB URINE DIPSTICK: NEGATIVE
KETONES UR: NEGATIVE mg/dL
LEUKOCYTES UA: NEGATIVE
Nitrite: NEGATIVE
PROTEIN: NEGATIVE mg/dL
Specific Gravity, Urine: 1.018 (ref 1.005–1.030)
pH: 6 (ref 5.0–8.0)

## 2016-01-18 LAB — COMPREHENSIVE METABOLIC PANEL
ALT: 18 U/L (ref 14–54)
AST: 54 U/L — AB (ref 15–41)
Albumin: 3.2 g/dL — ABNORMAL LOW (ref 3.5–5.0)
Alkaline Phosphatase: 92 U/L (ref 38–126)
Anion gap: 9 (ref 5–15)
BILIRUBIN TOTAL: 0.9 mg/dL (ref 0.3–1.2)
BUN: 11 mg/dL (ref 6–20)
CO2: 27 mmol/L (ref 22–32)
Calcium: 8.6 mg/dL — ABNORMAL LOW (ref 8.9–10.3)
Chloride: 104 mmol/L (ref 101–111)
Creatinine, Ser: 0.81 mg/dL (ref 0.44–1.00)
Glucose, Bld: 165 mg/dL — ABNORMAL HIGH (ref 65–99)
POTASSIUM: 4.4 mmol/L (ref 3.5–5.1)
Sodium: 140 mmol/L (ref 135–145)
TOTAL PROTEIN: 6.5 g/dL (ref 6.5–8.1)

## 2016-01-18 LAB — CBC WITH DIFFERENTIAL/PLATELET
BASOS ABS: 0 10*3/uL (ref 0.0–0.1)
Basophils Relative: 0 %
EOS PCT: 1 %
Eosinophils Absolute: 0.1 10*3/uL (ref 0.0–0.7)
HEMATOCRIT: 34 % — AB (ref 36.0–46.0)
Hemoglobin: 11.1 g/dL — ABNORMAL LOW (ref 12.0–15.0)
LYMPHS ABS: 0.7 10*3/uL (ref 0.7–4.0)
LYMPHS PCT: 14 %
MCH: 28.5 pg (ref 26.0–34.0)
MCHC: 32.6 g/dL (ref 30.0–36.0)
MCV: 87.2 fL (ref 78.0–100.0)
MONO ABS: 0.3 10*3/uL (ref 0.1–1.0)
MONOS PCT: 7 %
NEUTROS ABS: 3.8 10*3/uL (ref 1.7–7.7)
Neutrophils Relative %: 78 %
PLATELETS: 94 10*3/uL — AB (ref 150–400)
RBC: 3.9 MIL/uL (ref 3.87–5.11)
RDW: 17 % — AB (ref 11.5–15.5)
WBC: 4.9 10*3/uL (ref 4.0–10.5)

## 2016-01-18 LAB — TROPONIN I: TROPONIN I: 0.03 ng/mL (ref <0.031)

## 2016-01-18 LAB — CBG MONITORING, ED: GLUCOSE-CAPILLARY: 150 mg/dL — AB (ref 65–99)

## 2016-01-18 MED ORDER — SODIUM CHLORIDE 0.9 % IV SOLN
INTRAVENOUS | Status: AC
Start: 2016-01-19 — End: 2016-01-19
  Administered 2016-01-19: 01:00:00 via INTRAVENOUS

## 2016-01-18 MED ORDER — ALBUTEROL SULFATE (2.5 MG/3ML) 0.083% IN NEBU
2.5000 mg | INHALATION_SOLUTION | RESPIRATORY_TRACT | Status: DC | PRN
  Administered 2016-01-20: 2.5 mg via RESPIRATORY_TRACT
  Filled 2016-01-18: qty 3

## 2016-01-18 MED ORDER — HYDROCORTISONE ACETATE 25 MG RE SUPP
25.0000 mg | Freq: Every day | RECTAL | Status: DC | PRN
  Filled 2016-01-18: qty 1

## 2016-01-18 MED ORDER — PANTOPRAZOLE SODIUM 40 MG PO TBEC
40.0000 mg | DELAYED_RELEASE_TABLET | Freq: Every day | ORAL | Status: DC
  Administered 2016-01-19 – 2016-01-23 (×4): 40 mg via ORAL
  Filled 2016-01-18 (×5): qty 1

## 2016-01-18 MED ORDER — SACCHAROMYCES BOULARDII 250 MG PO CAPS
250.0000 mg | ORAL_CAPSULE | Freq: Every day | ORAL | Status: DC
  Administered 2016-01-19 – 2016-01-23 (×4): 250 mg via ORAL
  Filled 2016-01-18 (×5): qty 1

## 2016-01-18 MED ORDER — ACETAMINOPHEN 650 MG RE SUPP: 650.0000 mg | RECTAL | Status: DC | PRN

## 2016-01-18 MED ORDER — POLYVINYL ALCOHOL 1.4 % OP SOLN
1.0000 [drp] | Freq: Two times a day (BID) | OPHTHALMIC | Status: DC | PRN
  Filled 2016-01-18: qty 15

## 2016-01-18 MED ORDER — SODIUM CHLORIDE 0.9 % IV SOLN
1000.0000 mL | Freq: Once | INTRAVENOUS | Status: AC
  Administered 2016-01-18: 1000 mL via INTRAVENOUS

## 2016-01-18 MED ORDER — CLONAZEPAM 1 MG PO TABS
1.0000 mg | ORAL_TABLET | Freq: Two times a day (BID) | ORAL | Status: DC | PRN
  Administered 2016-01-19 – 2016-01-23 (×4): 1 mg via ORAL
  Filled 2016-01-18 (×4): qty 1

## 2016-01-18 MED ORDER — SODIUM CHLORIDE 0.9 % IV SOLN: 1000.0000 mL | INTRAVENOUS | Status: DC

## 2016-01-18 MED ORDER — INSULIN ASPART 100 UNIT/ML ~~LOC~~ SOLN
0.0000 [IU] | Freq: Three times a day (TID) | SUBCUTANEOUS | Status: DC
  Administered 2016-01-20: 2 [IU] via SUBCUTANEOUS
  Administered 2016-01-21: 5 [IU] via SUBCUTANEOUS
  Administered 2016-01-21 – 2016-01-23 (×3): 1 [IU] via SUBCUTANEOUS

## 2016-01-18 MED ORDER — TRAMADOL HCL 50 MG PO TABS: 50.0000 mg | ORAL_TABLET | Freq: Two times a day (BID) | ORAL | Status: DC | PRN

## 2016-01-18 MED ORDER — ALBUTEROL SULFATE HFA 108 (90 BASE) MCG/ACT IN AERS: 2.0000 | INHALATION_SPRAY | RESPIRATORY_TRACT | Status: DC | PRN

## 2016-01-18 MED ORDER — ONDANSETRON HCL 4 MG/2ML IJ SOLN
4.0000 mg | Freq: Once | INTRAMUSCULAR | Status: AC
  Administered 2016-01-18: 4 mg via INTRAVENOUS
  Filled 2016-01-18: qty 2

## 2016-01-18 MED ORDER — PHENYTOIN SODIUM EXTENDED 100 MG PO CAPS
100.0000 mg | ORAL_CAPSULE | Freq: Every day | ORAL | Status: DC
  Administered 2016-01-19 – 2016-01-22 (×5): 100 mg via ORAL
  Filled 2016-01-18 (×6): qty 1

## 2016-01-18 MED ORDER — ACETAMINOPHEN 325 MG PO TABS
650.0000 mg | ORAL_TABLET | ORAL | Status: DC | PRN
Start: 2016-01-18 — End: 2016-01-23

## 2016-01-18 MED ORDER — CLOPIDOGREL BISULFATE 75 MG PO TABS
75.0000 mg | ORAL_TABLET | Freq: Every day | ORAL | Status: DC
  Administered 2016-01-19 – 2016-01-23 (×4): 75 mg via ORAL
  Filled 2016-01-18 (×5): qty 1

## 2016-01-18 MED ORDER — MONTELUKAST SODIUM 10 MG PO TABS
10.0000 mg | ORAL_TABLET | Freq: Every day | ORAL | Status: DC
  Administered 2016-01-19 – 2016-01-22 (×5): 10 mg via ORAL
  Filled 2016-01-18 (×5): qty 1

## 2016-01-18 MED ORDER — ARFORMOTEROL TARTRATE 15 MCG/2ML IN NEBU
15.0000 ug | INHALATION_SOLUTION | Freq: Two times a day (BID) | RESPIRATORY_TRACT | Status: DC
  Administered 2016-01-19 – 2016-01-23 (×9): 15 ug via RESPIRATORY_TRACT
  Filled 2016-01-18 (×9): qty 2

## 2016-01-18 MED ORDER — PROSIGHT PO TABS
1.0000 | ORAL_TABLET | Freq: Two times a day (BID) | ORAL | Status: DC
  Administered 2016-01-19 – 2016-01-23 (×9): 1 via ORAL
  Filled 2016-01-18 (×11): qty 1

## 2016-01-18 MED ORDER — SENNOSIDES-DOCUSATE SODIUM 8.6-50 MG PO TABS: 1.0000 | ORAL_TABLET | Freq: Every evening | ORAL | Status: DC | PRN

## 2016-01-18 NOTE — ED Notes (Signed)
Pt refuses rectal temperature. Campos aware.

## 2016-01-18 NOTE — ED Notes (Signed)
Bed: [id number] Expected date: 01/18/16 Expected time: 3:18 PM Means of arrival:  Comments: lethargic

## 2016-01-18 NOTE — ED Provider Notes (Signed)
CSN: [id number]     Arrival date & time 01/18/16  1523 History   First MD Initiated Contact with Patient 01/18/16 1548     Chief Complaint  Patient presents with  . Fatigue     Level V caveat: Confusion  HPI Patient presents to the emergency department via EMS from home where she lives at home by herself.  Family reports increasing confusion over the past 24 hours.  No known falls.  Patient reports that this week she is just felt overall poorly.  She is unable to elaborate any further than this.  The daughter reports that normally she drives and walks without difficulty but today she is much more confused and the daughter does not believe she would be able to do any of this.  Patient denies weakness of her arm or legs.  No reports of fever.  No reports of vomiting but she has had nausea.  No reports of productive cough.  Refused rectal temperature here.  No recent change in her medications.   Past Medical History  Diagnosis Date  . Aortic stenosis     moderate by echo 6/11 gradient 20/77mmHg for mean and peak  . TIA (transient ischemic attack)     Dr. [name]  . Peripheral vascular disease ([place])   . IBS (irritable bowel syndrome)   . Diabetes mellitus     type II  . Chronic bronchitis   . COPD (chronic obstructive pulmonary disease) ([place])   . Atherosclerosis   . Depression   . Anxiety     Dr Casimiro Needle  . Renal cyst   . Anemia     iron deficiency  . Cellulitis   . Diverticular disease   . Infectious diarrhea(009.2)   . Sinusitis   . Thrush   . Abnormal liver function test   . GERD (gastroesophageal reflux disease)   . Insomnia     chronic  . Adenocarcinoma ([place]) 2008    colon,s/p right hemicolectomy  . Angina   . Seizures ([facility])     hx of one seizure "  . Cirrhosis of liver ([facility])     hx of  . Clostridium difficile diarrhea   . Thrombocytopenia ([place]) 04/01/2013  . Bursitis     spine per pt  . Macular degeneration 02/18/2014    Follows with [facility]  . Internal  hemorrhoids   . Asthma   . Elevated BP 07/09/2015  . Type 2 diabetes mellitus with vascular disease ([place]) 03/02/2007    Chronic     Past Surgical History  Procedure Laterality Date  . Carotid endarterectomy    . Colectomy      right hemi  . Cholecystectomy  11/09  . Upper gastrointestinal endoscopy    . Tonsillectomy    . Appendectomy     Family History  Problem Relation Age of Onset  . Colon cancer Paternal Grandmother   . Esophageal cancer Brother   . Heart disease Mother   . Diabetes Brother   . Hypertension Father   . Rectal cancer Neg Hx   . Stomach cancer Neg Hx    Social History  Substance Use Topics  . Smoking status: Light Tobacco Smoker -- 0.50 packs/day for 70 years    Types: Cigarettes    Start date: 02/14/1951  . Smokeless tobacco: Never Used     Comment: smokes 1-2 cigarettes daily  . Alcohol Use: No   OB History    No data available     Review of  Systems  Unable to perform ROS: Mental status change      Allergies  Bactrim; Levofloxacin; Pneumovax; Amoxicillin-pot clavulanate; Penicillins; Bupropion; Haldol; Haloperidol; Morphine; Morphine and related; Pneumococcal vaccine; Pseudoephedrine hcl; and Sudafed  Home Medications   Prior to Admission medications   Medication Sig Start Date End Date Taking? Authorizing Provider  albuterol (PROAIR HFA) 108 (90 BASE) MCG/ACT inhaler Inhale 2 puffs into the lungs every 4 (four) hours as needed for wheezing or shortness of breath. 11/12/14  Yes Mosie Lukes, MD  cholestyramine (QUESTRAN) 4 g packet MIX 1 PACKET (4 GRAM TOTAL) IN LIQUID AND DRINK DAILY AS NEEDED FOR DIARRHEA 11/20/15  Yes Mosie Lukes, MD  clonazePAM (KLONOPIN) 1 MG tablet Take 1 mg by mouth 2 (two) times daily as needed for anxiety.    Yes Historical Provider, MD  clopidogrel (PLAVIX) 75 MG tablet TAKE 1 TABLET DAILY 10/18/15  Yes Mosie Lukes, MD  desvenlafaxine (PRISTIQ) 100 MG 24 hr tablet Take 100 mg by mouth daily.   Yes Historical  Provider, MD  ergocalciferol (VITAMIN D2) 50000 UNITS capsule Take 1 capsule (50,000 Units total) by mouth once a week. 08/06/15  Yes Mosie Lukes, MD  fluticasone (FLONASE) 50 MCG/ACT nasal spray USE 2 SPRAYS IN EACH NOSTRIL DAILY AS NEEDED FOR ALLERGIES 10/18/15  Yes Mosie Lukes, MD  formoterol (PERFOROMIST) 20 MCG/2ML nebulizer solution Take 2 mLs (20 mcg total) by nebulization 2 (two) times daily. 01/14/15  Yes Elsie Stain, MD  hydrocortisone (ANUSOL-HC) 25 MG suppository Use 1 suppository per rectum as needed. Patient taking differently: Place 25 mg rectally daily as needed for hemorrhoids or itching. Use 1 suppository per rectum as needed. 03/14/15  Yes Lori P Hvozdovic, PA-C  KLOR-CON M10 10 MEQ tablet TAKE 1 TABLET TWICE A DAY 08/18/15  Yes Mosie Lukes, MD  metFORMIN (GLUCOPHAGE) 500 MG tablet TAKE ONE-HALF (1/2) TO ONE TABLET DAILY WITH BREAKFAST 12/16/15  Yes Mosie Lukes, MD  montelukast (SINGULAIR) 10 MG tablet Take 10 mg by mouth at bedtime.   Yes Historical Provider, MD  Multiple Vitamins-Minerals (OCUVITE PRESERVISION) TABS Take by mouth 2 (two) times daily.   Yes Historical Provider, MD  ondansetron (ZOFRAN) 4 MG tablet Take 1 tablet (4 mg total) by mouth every 6 (six) hours as needed for nausea or vomiting. 03/14/15  Yes Lori P Hvozdovic, PA-C  pantoprazole (PROTONIX) 40 MG tablet Take 1 tab by mouth every morning. Patient taking differently: Take 40 mg by mouth daily. Take 1 tab by mouth every morning. 03/14/15  Yes Lori P Hvozdovic, PA-C  phenytoin (DILANTIN) 100 MG ER capsule TAKE TWO CAPSULES BY MOUTH AT BEDTIME DAILY Patient taking differently: 100mg  daily at bedtime 11/06/14  Yes Mosie Lukes, MD  Polyethyl Glycol-Propyl Glycol (SYSTANE) 0.4-0.3 % SOLN Place 1 drop into both eyes 2 (two) times daily as needed (dry eyes).    Yes Historical Provider, MD  saccharomyces boulardii (FLORASTOR) 250 MG capsule Take 250 mg by mouth daily.   Yes Historical Provider, MD  sodium  chloride (OCEAN) 0.65 % SOLN nasal spray Place 2 sprays into both nostrils daily as needed for congestion.    Yes Historical Provider, MD  traMADol (ULTRAM) 50 MG tablet TAKE ONE TABLET BY MOUTH EVERY 12 HOURS AS NEEDED FOR MODERATE PAIN OR SEVERE PAIN 11/17/15  Yes Mosie Lukes, MD  HYDROcodone-acetaminophen (NORCO) 5-325 MG tablet Take 1 tablet by mouth every 6 (six) hours as needed for moderate pain. Patient not  taking: Reported on 01/18/2016 11/05/15   Dene Gentry, MD   BP 172/75 mmHg  Pulse 83  Temp(Src) 97.5 F (36.4 C) (Oral)  Resp 18  SpO2 93% Physical Exam  Constitutional: She appears well-developed and well-nourished. No distress.  HENT:  Head: Normocephalic and atraumatic.  Eyes: EOM are normal. Pupils are equal, round, and reactive to light.  Neck: Normal range of motion.  Cardiovascular: Normal rate, regular rhythm and normal heart sounds.   Pulmonary/Chest: Effort normal and breath sounds normal.  Abdominal: Soft. She exhibits no distension. There is no tenderness.  Musculoskeletal: Normal range of motion.  Neurological: She is alert.  5/5 strength in major muscle groups of  bilateral upper and lower extremities. Speech normal. No facial asymetry.   Skin: Skin is warm and dry.  Psychiatric: She has a normal mood and affect. Judgment normal.  Nursing note and vitals reviewed.   ED Course  Procedures (including critical care time) Labs Review Labs Reviewed  CBC WITH DIFFERENTIAL/PLATELET - Abnormal; Notable for the following:    Hemoglobin 11.1 (*)    HCT 34.0 (*)    RDW 17.0 (*)    Platelets 94 (*)    All other components within normal limits  COMPREHENSIVE METABOLIC PANEL - Abnormal; Notable for the following:    Glucose, Bld 165 (*)    Calcium 8.6 (*)    Albumin 3.2 (*)    AST 54 (*)    All other components within normal limits  CBG MONITORING, ED - Abnormal; Notable for the following:    Glucose-Capillary 150 (*)    All other components within normal  limits  URINE CULTURE  TROPONIN I  URINALYSIS, ROUTINE W REFLEX MICROSCOPIC (NOT AT [facility])    Imaging Review Dg Chest 2 View  01/18/2016  CLINICAL DATA:  Generalized weakness and cough EXAM: CHEST  2 VIEW COMPARISON:  11/16/2015 chest radiograph. FINDINGS: Stable cardiomediastinal silhouette with normal heart size. No pneumothorax. No pleural effusion. Emphysema. No pulmonary edema. No acute consolidative airspace disease. IMPRESSION: Emphysema.  Otherwise no active disease in the chest. Electronically Signed   By: Ilona Sorrel M.D.   On: 01/18/2016 17:29   Ct Head Wo Contrast  01/18/2016  CLINICAL DATA:  Progressive weakness and altered mental status. EXAM: CT HEAD WITHOUT CONTRAST TECHNIQUE: Contiguous axial images were obtained from the base of the skull through the vertex without intravenous contrast. COMPARISON:  05/25/2015 FINDINGS: There is no intracranial hemorrhage, mass or evidence of acute infarction. There is no extra-axial fluid collection. There is moderate generalized atrophy. There is white matter hypodensity consistent with small vessel ischemic disease. Chronic lacunar infarct in the right cerebellar hemisphere. Probable additional chronic lacunar infarct in the right putamen. No acute findings are evident. No interval change is evident. There is no bony abnormality. Visible paranasal sinuses are clear. Visible orbits are unremarkable. IMPRESSION: No acute intracranial findings. There is moderate generalized atrophy and small vessel ischemic changes. Electronically Signed   By: Andreas Newport M.D.   On: 01/18/2016 21:34   I have personally reviewed and evaluated these images and lab results as part of my medical decision-making.   EKG Interpretation   Date/Time:  Saturday January 18 2016 18:00:09 EDT Ventricular Rate:  80 PR Interval:  155 QRS Duration: 113 QT Interval:  475 QTC Calculation: 548 R Axis:   9 Text Interpretation:  Sinus rhythm Borderline intraventricular  conduction  delay Nonspecific T abnrm, anterolateral leads Prolonged QT interval No  significant change was found  Confirmed by Hydeia Mcatee  MD, Lennette Bihari ([id number]) on  01/18/2016 7:45:43 PM      MDM   Final diagnoses:  None    Patient is unable to ambulate safely in the hall and appears to be a fall risk.  The daughter reports that this is deathly a change from her baseline and reports that she is much more confused over the past 24 hours.  Patient be admitted to the hospital for overnight observation.  Labs, urine, chest x-ray, head CT without acute abnormality.  Unclear etiology of her symptoms.  Feeling that there is some component of this which is chronic and maybe not as acute as is being described to [place].    Jola Schmidt, MD 01/18/16 2206

## 2016-01-18 NOTE — ED Notes (Addendum)
Pt from home (lives alone) via EMS. Per family her speech is "different", EMS put her dentures in and pt's speech improved. Pt states she has felt weak since her colon surgery 13 years ago. Pt's nero assessment is unremarkable. Pt reports no pain. Pt is alert and oriented x 4. Pt has hearing impairment  Pt reports no changes in vision Pt has equal bilateral strength

## 2016-01-18 NOTE — ED Notes (Signed)
Updated contact info- Beulah Gandy- Daughter [phone]

## 2016-01-18 NOTE — H&P (Signed)
[facility] History and Physical  Monique Fleming [id number] DOB: 02-Jun-1929 DOA: 01/18/2016  Referring physician: ED physician PCP: Penni Homans, MD  Specialists: Dr. Elsworth Soho (pulm), Dr. Marin Olp (onc), Dr. Johnsie Cancel (cards)   Chief Complaint:  Confusion, speech disturbance   HPI: Monique Fleming is a 82 y.o. female with PMH of seizure disorder, anxiety, type 2 diabetes mellitus, and COPD who presents with 24 hours of confusion, speech disturbance, and impaired gait. Patient lives alone but has family nearby who checks on her frequently. Patient also has a caretaker during the days. Patient had called her daughter yesterday to explain that she had not been feeling well. Her daughter noted his speech disturbance marked by slurred words and some difficulty with word-finding. Today, patient's caretaker noted the patient to be lethargic and with slurred speech. The caretaker called the patient's daughter to come evaluate her. There was concern on the patient's daughter's part that the patient may have had a stroke or TIA, or possibly a UTI and EMS was called for transport to the hospital.  history is obtained mainly from the patient's daughter, discussion with the ED personnel, and review of the medical records. There's been no fevers noted, no cough, and no apparent vomiting or diarrhea. Patient hasn't made any specific complaints, but it is noted that she "just doesn't feel right."   In ED, patient was found to  be afebrile, saturating well on room air, and with vital signs stable. Head CT is negative for acute intracranial abnormality and chest x-ray demonstrates emphysematous changes but no acute findings. CMP is largely unremarkable and CBC features a hemoglobin of 11.1 and platelet count of 94,000 with both indices appearing stable relative to prior measurements. EKG features prolonged QTc interval to 548 ms and nonspecific T-wave abnormalities in the anterolateral leads. Troponin returns normal at  0.03. Urine is grossly negative for infection and cultures been requested. Patient was bolused with 1 L of normal saline and started on normal saline infusion at 125 ML per hour. Patient remained confused in the emergency department and with slowed, slurred speech. She remained hemodynamically stable however will be admitted to the hospital for ongoing evaluation and management of her acute neurologic changes.   Where does patient live?   At home    Can patient participate in ADLs?  Yes         Review of Systems:  Unable to obtain a reliable ROS secondary to the patient's clinical condition with confusion.     Allergy:  Allergies  Allergen Reactions  . Bactrim [Sulfamethoxazole-Trimethoprim] Shortness Of Breath  . Levofloxacin Other (See Comments)    seizure  . Pneumovax [Pneumococcal Polysaccharide Vaccine] Other (See Comments)    Severe allergy-cellulitis  . Amoxicillin-Pot Clavulanate Other (See Comments)    Patient stated Drs. recommend taking Augmentin due to her PCN allergy.  . Penicillins Rash    Has patient had a PCN reaction causing immediate rash, facial/tongue/throat swelling, SOB or lightheadedness with hypotension: No Has patient had a PCN reaction causing severe rash involving mucus membranes or skin necrosis: No Has patient had a PCN reaction that required hospitalization No Has patient had a PCN reaction occurring within the last 10 years: No If all of the above answers are "NO", then may proceed with Cephalosporin use.   . Bupropion   . Haldol [Haloperidol Lactate]     Had unknown reaction many yrs ago per pt  . Haloperidol   . Morphine   . Morphine And Related  Confused and agitation  . Pneumococcal Vaccine   . Pseudoephedrine Hcl   . Sudafed [Pseudoephedrine Hcl] Other (See Comments)    seizure    Past Medical History  Diagnosis Date  . Aortic stenosis     moderate by echo 6/11 gradient 20/39mmHg for mean and peak  . TIA (transient ischemic attack)      Dr. [name]  . Peripheral vascular disease ([place])   . IBS (irritable bowel syndrome)   . Diabetes mellitus     type II  . Chronic bronchitis   . COPD (chronic obstructive pulmonary disease) ([place])   . Atherosclerosis   . Depression   . Anxiety     Dr Casimiro Needle  . Renal cyst   . Anemia     iron deficiency  . Cellulitis   . Diverticular disease   . Infectious diarrhea(009.2)   . Sinusitis   . Thrush   . Abnormal liver function test   . GERD (gastroesophageal reflux disease)   . Insomnia     chronic  . Adenocarcinoma ([place]) 2008    colon,s/p right hemicolectomy  . Angina   . Seizures ([facility])     hx of one seizure "  . Cirrhosis of liver ([facility])     hx of  . Clostridium difficile diarrhea   . Thrombocytopenia ([place]) 04/01/2013  . Bursitis     spine per pt  . Macular degeneration 02/18/2014    Follows with [facility]  . Internal hemorrhoids   . Asthma   . Elevated BP 07/09/2015  . Type 2 diabetes mellitus with vascular disease ([place]) 03/02/2007    Chronic      Past Surgical History  Procedure Laterality Date  . Carotid endarterectomy    . Colectomy      right hemi  . Cholecystectomy  11/09  . Upper gastrointestinal endoscopy    . Tonsillectomy    . Appendectomy      Social History:  reports that she has been smoking Cigarettes.  She started smoking about 64 years ago. She has a 35 pack-year smoking history. She has never used smokeless tobacco. She reports that she does not drink alcohol or use illicit drugs.  Family History:  Family History  Problem Relation Age of Onset  . Colon cancer Paternal Grandmother   . Esophageal cancer Brother   . Heart disease Mother   . Diabetes Brother   . Hypertension Father   . Rectal cancer Neg Hx   . Stomach cancer Neg Hx      Prior to Admission medications   Medication Sig Start Date End Date Taking? Authorizing Provider  albuterol (PROAIR HFA) 108 (90 BASE) MCG/ACT inhaler Inhale 2 puffs into the lungs every 4 (four) hours as needed  for wheezing or shortness of breath. 11/12/14  Yes Mosie Lukes, MD  cholestyramine (QUESTRAN) 4 g packet MIX 1 PACKET (4 GRAM TOTAL) IN LIQUID AND DRINK DAILY AS NEEDED FOR DIARRHEA 11/20/15  Yes Mosie Lukes, MD  clonazePAM (KLONOPIN) 1 MG tablet Take 1 mg by mouth 2 (two) times daily as needed for anxiety.    Yes Historical Provider, MD  clopidogrel (PLAVIX) 75 MG tablet TAKE 1 TABLET DAILY 10/18/15  Yes Mosie Lukes, MD  desvenlafaxine (PRISTIQ) 100 MG 24 hr tablet Take 100 mg by mouth daily.   Yes Historical Provider, MD  ergocalciferol (VITAMIN D2) 50000 UNITS capsule Take 1 capsule (50,000 Units total) by mouth once a week. 08/06/15  Yes Mosie Lukes,  MD  fluticasone (FLONASE) 50 MCG/ACT nasal spray USE 2 SPRAYS IN EACH NOSTRIL DAILY AS NEEDED FOR ALLERGIES 10/18/15  Yes Mosie Lukes, MD  formoterol (PERFOROMIST) 20 MCG/2ML nebulizer solution Take 2 mLs (20 mcg total) by nebulization 2 (two) times daily. 01/14/15  Yes Elsie Stain, MD  hydrocortisone (ANUSOL-HC) 25 MG suppository Use 1 suppository per rectum as needed. Patient taking differently: Place 25 mg rectally daily as needed for hemorrhoids or itching. Use 1 suppository per rectum as needed. 03/14/15  Yes Lori P Hvozdovic, PA-C  KLOR-CON M10 10 MEQ tablet TAKE 1 TABLET TWICE A DAY 08/18/15  Yes Mosie Lukes, MD  metFORMIN (GLUCOPHAGE) 500 MG tablet TAKE ONE-HALF (1/2) TO ONE TABLET DAILY WITH BREAKFAST 12/16/15  Yes Mosie Lukes, MD  montelukast (SINGULAIR) 10 MG tablet Take 10 mg by mouth at bedtime.   Yes Historical Provider, MD  Multiple Vitamins-Minerals (OCUVITE PRESERVISION) TABS Take by mouth 2 (two) times daily.   Yes Historical Provider, MD  ondansetron (ZOFRAN) 4 MG tablet Take 1 tablet (4 mg total) by mouth every 6 (six) hours as needed for nausea or vomiting. 03/14/15  Yes Lori P Hvozdovic, PA-C  pantoprazole (PROTONIX) 40 MG tablet Take 1 tab by mouth every morning. Patient taking differently: Take 40 mg by mouth  daily. Take 1 tab by mouth every morning. 03/14/15  Yes Lori P Hvozdovic, PA-C  phenytoin (DILANTIN) 100 MG ER capsule TAKE TWO CAPSULES BY MOUTH AT BEDTIME DAILY Patient taking differently: 100mg  daily at bedtime 11/06/14  Yes Mosie Lukes, MD  Polyethyl Glycol-Propyl Glycol (SYSTANE) 0.4-0.3 % SOLN Place 1 drop into both eyes 2 (two) times daily as needed (dry eyes).    Yes Historical Provider, MD  saccharomyces boulardii (FLORASTOR) 250 MG capsule Take 250 mg by mouth daily.   Yes Historical Provider, MD  sodium chloride (OCEAN) 0.65 % SOLN nasal spray Place 2 sprays into both nostrils daily as needed for congestion.    Yes Historical Provider, MD  traMADol (ULTRAM) 50 MG tablet TAKE ONE TABLET BY MOUTH EVERY 12 HOURS AS NEEDED FOR MODERATE PAIN OR SEVERE PAIN 11/17/15  Yes Mosie Lukes, MD  HYDROcodone-acetaminophen (NORCO) 5-325 MG tablet Take 1 tablet by mouth every 6 (six) hours as needed for moderate pain. Patient not taking: Reported on 01/18/2016 11/05/15   Dene Gentry, MD    Physical Exam: Filed Vitals:   01/18/16 1844 01/18/16 2039 01/18/16 2244 01/18/16 2334  BP: 172/75 160/75 160/79 160/61  Pulse: 83 83 60 83  Temp: 97.5 F (36.4 C)   97.8 F (36.6 C)  TempSrc: Oral   Oral  Resp: 18 16 18    Height:    5\' 3"  (1.6 m)  Weight:    57.1 kg (125 lb 14.1 oz)  SpO2: 93% 96% 97% 99%   General: Not in acute distress. Frail, chronically-ill in appearance.  HEENT:       Eyes: PERRL, EOMI, no scleral icterus or conjunctival pallor.       ENT: No discharge from the ears or nose, no pharyngeal ulcers.        Neck: No JVD, soft bruit on left, no appreciable mass Heme: No cervical adenopathy, no pallor Cardiac: S1/S2, RRR, grade III holosystolic murmur throughout precordium, No gallops or rubs. Pulm: Good air movement bilaterally. No rales, wheezing, rhonchi or rubs. Abd: Soft, nondistended, nontender, no rebound pain or gaurding, BS present. Ext: No LE edema bilaterally. 2+DP/PT  pulse bilaterally. Musculoskeletal: No gross  deformity, no red, hot, swollen joints   Skin: No rashes or wounds on exposed surfaces  Neuro: Alert, oriented to person and place only, cranial nerves II-XII grossly intact, muscle strength 5/5 in all extremities, sensation to light touch intact. Knee reflex 2+ bilaterally. Negative Babinski's sign. Tremulous with finger to nose test, mild dysmetria.   Psych: Patient is not overtly psychotic, appropriate mood and affect.  Labs on Admission:  Basic Metabolic Panel:  Recent Labs Lab 01/18/16 1655  NA 140  K 4.4  CL 104  CO2 27  GLUCOSE 165*  BUN 11  CREATININE 0.81  CALCIUM 8.6*   Liver Function Tests:  Recent Labs Lab 01/18/16 1655  AST 54*  ALT 18  ALKPHOS 92  BILITOT 0.9  PROT 6.5  ALBUMIN 3.2*   No results for input(s): LIPASE, AMYLASE in the last 168 hours. No results for input(s): AMMONIA in the last 168 hours. CBC:  Recent Labs Lab 01/18/16 1655  WBC 4.9  NEUTROABS 3.8  HGB 11.1*  HCT 34.0*  MCV 87.2  PLT 94*   Cardiac Enzymes:  Recent Labs Lab 01/18/16 1655  TROPONINI 0.03    BNP (last 3 results)  Recent Labs  05/25/15 1845  BNP 630.3*    ProBNP (last 3 results)  Recent Labs  02/07/15 1355  PROBNP 564.0*    CBG:  Recent Labs Lab 01/18/16 1556  GLUCAP 150*    Radiological Exams on Admission: Dg Chest 2 View  01/18/2016  CLINICAL DATA:  Generalized weakness and cough EXAM: CHEST  2 VIEW COMPARISON:  11/16/2015 chest radiograph. FINDINGS: Stable cardiomediastinal silhouette with normal heart size. No pneumothorax. No pleural effusion. Emphysema. No pulmonary edema. No acute consolidative airspace disease. IMPRESSION: Emphysema.  Otherwise no active disease in the chest. Electronically Signed   By: Ilona Sorrel M.D.   On: 01/18/2016 17:29   Ct Head Wo Contrast  01/18/2016  CLINICAL DATA:  Progressive weakness and altered mental status. EXAM: CT HEAD WITHOUT CONTRAST TECHNIQUE:  Contiguous axial images were obtained from the base of the skull through the vertex without intravenous contrast. COMPARISON:  05/25/2015 FINDINGS: There is no intracranial hemorrhage, mass or evidence of acute infarction. There is no extra-axial fluid collection. There is moderate generalized atrophy. There is white matter hypodensity consistent with small vessel ischemic disease. Chronic lacunar infarct in the right cerebellar hemisphere. Probable additional chronic lacunar infarct in the right putamen. No acute findings are evident. No interval change is evident. There is no bony abnormality. Visible paranasal sinuses are clear. Visible orbits are unremarkable. IMPRESSION: No acute intracranial findings. There is moderate generalized atrophy and small vessel ischemic changes. Electronically Signed   By: Andreas Newport M.D.   On: 01/18/2016 21:34    EKG: Independently reviewed.  Abnormal findings:  Sinus rhythm, non-specific T-wave abnormality in anterolateral leads, QTc 548 ms    Assessment/Plan  1. Acute encephalopathy  - No suggestion of active infection  - Electrolytes wnl  - Head CT without acute intracranial abnormalities  - Concerned she may have suffered CVA not seen on CT  - Eval with MRI brain, TTE, carotid dopplers  2. Type II DM  - Managed with metformin at home  - A1c was 7.1% in October 2016, reflecting adequate control for age at that time  - Hold metformin while admitted  - Check CBG with meals and qHS  - Low-intensity SSI correctional initiated, adjust prn  - Update A1c, pending    3. COPD  - Appears to  be stable at this time  - Continue home LABA with Brovana while admitted  - Continue Singulair  - Albuterol MDI prn SOB, wheezing   4. Anemia, thrombocytopenia  - Hgb 11.1; Plt 94,000 on admission; both indices stable relative to priors  - She has iron-deficiency anemia attributed to chronic GI blood loss and managed by Dr. Marin Olp with Maris[facility] [place] infusions - Hold  pharmacologic VTE ppx; SCD only for now  - Monitor    5. Prolonged QT interval - QTc prolonged to 548 ms on admission  - Check mag level and replace prn  - Monitor on telemetry  - Hold Pristiq for now, resume if QTc improves     DVT ppx:  SCDs  Code Status: DNR Family Communication: Yes, patient's daugher at bed side Disposition Plan: Admit to inpatient   Date of Service 01/18/2016    Vianne Bulls, MD [facility] Pager [phone]  If 7PM-7AM, please contact night-coverage [email or web address] Password [facility] 01/18/2016, 11:54 PM

## 2016-01-18 NOTE — ED Notes (Signed)
Pt refused rectal temp. Dr. Venora Maples notified

## 2016-01-19 ENCOUNTER — Observation Stay (HOSPITAL_COMMUNITY): Payer: Medicare Other

## 2016-01-19 ENCOUNTER — Encounter: Payer: Self-pay | Admitting: Hematology & Oncology

## 2016-01-19 DIAGNOSIS — R479 Unspecified speech disturbances: Secondary | ICD-10-CM | POA: Diagnosis not present

## 2016-01-19 DIAGNOSIS — E119 Type 2 diabetes mellitus without complications: Secondary | ICD-10-CM | POA: Diagnosis not present

## 2016-01-19 DIAGNOSIS — I639 Cerebral infarction, unspecified: Secondary | ICD-10-CM | POA: Diagnosis not present

## 2016-01-19 DIAGNOSIS — D649 Anemia, unspecified: Secondary | ICD-10-CM | POA: Insufficient documentation

## 2016-01-19 DIAGNOSIS — G9340 Encephalopathy, unspecified: Secondary | ICD-10-CM | POA: Diagnosis not present

## 2016-01-19 LAB — LIPID PANEL
CHOL/HDL RATIO: 4.5 ratio
Cholesterol: 118 mg/dL (ref 0–200)
HDL: 26 mg/dL — ABNORMAL LOW (ref >40)
LDL CALC: 77 mg/dL (ref 0–99)
Triglycerides: 76 mg/dL (ref <150)
VLDL: 15 mg/dL (ref 0–40)

## 2016-01-19 LAB — GLUCOSE, CAPILLARY
GLUCOSE-CAPILLARY: 105 mg/dL — AB (ref 65–99)
GLUCOSE-CAPILLARY: 88 mg/dL (ref 65–99)
GLUCOSE-CAPILLARY: 108 mg/dL — AB (ref 65–99)
Glucose-Capillary: 77 mg/dL (ref 65–99)

## 2016-01-19 LAB — MAGNESIUM: Magnesium: 1.1 mg/dL — ABNORMAL LOW (ref 1.7–2.4)

## 2016-01-19 LAB — TSH: TSH: 2.297 u[IU]/mL (ref 0.350–4.500)

## 2016-01-19 LAB — VITAMIN B12: VITAMIN B 12: 878 pg/mL (ref 180–914)

## 2016-01-19 LAB — PHENYTOIN LEVEL, TOTAL: Phenytoin Lvl: <2.5 ug/mL — ABNORMAL LOW (ref 10.0–20.0)

## 2016-01-19 MED ORDER — AMLODIPINE BESYLATE 5 MG PO TABS
5.0000 mg | ORAL_TABLET | Freq: Every day | ORAL | Status: DC
  Administered 2016-01-19 – 2016-01-23 (×4): 5 mg via ORAL
  Filled 2016-01-19 (×5): qty 1

## 2016-01-19 MED ORDER — MAGNESIUM SULFATE 2 GM/50ML IV SOLN
2.0000 g | Freq: Once | INTRAVENOUS | Status: AC
  Administered 2016-01-19: 2 g via INTRAVENOUS
  Filled 2016-01-19: qty 50

## 2016-01-19 MED ORDER — SODIUM CHLORIDE 0.9 % IV SOLN
INTRAVENOUS | Status: DC
  Administered 2016-01-20: 12:00:00 via INTRAVENOUS

## 2016-01-19 NOTE — Progress Notes (Signed)
VASCULAR LAB PRELIMINARY  PRELIMINARY  PRELIMINARY  PRELIMINARY  Carotid duplex completed.    Bilateral-  40-59% internal carotid artery stenosis. Prior to previous study on 01-08-2015 States stenosis of 50%-69% maybe used different crietria.  Bilateral Vertebral artery antegrade.   Kimberleigh Mehan, RVT, RDMS 01/19/2016, 10:10 AM

## 2016-01-19 NOTE — Progress Notes (Signed)
[facility] PROGRESS NOTE  Monique Fleming [id number] DOB: [birth year] DOA: 01/18/2016 PCP: Penni Homans, MD  Assessment/Plan: 1. Acute encephalopathy/failure to thrive -Monique Fleming is a pleasant 82 year old female who at baseline is highly functional, able to perform activities of daily living and still drives.-Sh -She presented with significant mental status changes -Initially there was concerns of this could represent CVA, worked up with CT scan of brain which was negative. This was followed up with an MRI of brain which did not reveal evidence of acute CVA. -Lab work has not shown evidence of infection that could precipitate delirium. Urinalysis was negative. Two-view chest x-ray did not reveal acute cardiopulmonary disease. She was afebrile. Labs revealed B-12 of 878 and TSH of 2.297. -Family members that she may be dehydrated having minimal by mouth intake over the last several days. Plan to continue IV fluids -Reassess today  2.  Type 2 diabetes mellitus -She had been on metformin at home. -Blood sugars controlled during this hospitalization. Will continue holding metformin for now.  3.  History of chronic obstructive pulmonary disease. -Stable exam, did not have evidence of respiratory compromise. -Chest x-ray negative.  4. History of seizure disorder. -Continue phenytoin  Code Status: DO NOT RESUSCITATE  Family Communication: Spoke to her daughter was present at bedside Disposition Plan: Anticipate discharge home versus SNF   Procedures:  Carotid Dopplers performed on 01/19/2016 Bilateral- 40-59% internal carotid artery stenosis. Prior to previous study on 01-08-2015 States stenosis of 50%-69% maybe used different crietria.  HPI/Subjective: Monique Fleming is a pleasant 82 year old female with history of seizure disorder, type 2 diabetes, currently resides in the community, admitted to medicine service on 01/18/2016 when she presented with state functional decline.  Family members noted her to have increasing confusion, word finding difficulties, "not acting herself". At baseline they stated that she is highly functional and able to carry on with her activities of daily living and still drives. There was concerns for acute CVA for which she was placed on stroke protocol. Initial CT scan was negative. His was followed up with an MRI of brain which did not reveal acute CVA. Radiology reported chronic hemorrhages thought to represent sequela of long-standing hypertensive cerebrovascular disease.  Objective: Filed Vitals:   01/18/16 2334 01/19/16 0536  BP: 160/61 126/54  Pulse: 83 82  Temp: 97.8 F (36.6 C) 98 F (36.7 C)  Resp:  18   No intake or output data in the 24 hours ending 01/19/16 1440 Filed Weights   01/18/16 2334  Weight: 57.1 kg (125 lb 14.1 oz)    Exam:   General:  No acute distress she is awake, appears confused at times having word finding difficulties, heart of hearing   Cardiovascular: Regular rate rhythm normal S1-S2   Respiratory: Normal respiratory effort lungs are clear   Abdomen: Soft nontender nondistended   Musculoskeletal: No edema  Neurological: On exam she had nonfocal neurologic findings, 5 of 5 muscle strength bilaterally no alteration to sensation. I did note some confusion and word finding difficulties  Data Reviewed: Basic Metabolic Panel:  Recent Labs Lab 01/18/16 1655 01/19/16 0524  NA 140  --   K 4.4  --   CL 104  --   CO2 27  --   GLUCOSE 165*  --   BUN 11  --   CREATININE 0.81  --   CALCIUM 8.6*  --   MG  --  1.1*   Liver Function Tests:  Recent Labs Lab 01/18/16 1655  AST  54*  ALT 18  ALKPHOS 92  BILITOT 0.9  PROT 6.5  ALBUMIN 3.2*   No results for input(s): LIPASE, AMYLASE in the last 168 hours. No results for input(s): AMMONIA in the last 168 hours. CBC:  Recent Labs Lab 01/18/16 1655  WBC 4.9  NEUTROABS 3.8  HGB 11.1*  HCT 34.0*  MCV 87.2  PLT 94*   Cardiac  Enzymes:  Recent Labs Lab 01/18/16 1655  TROPONINI 0.03   BNP (last 3 results)  Recent Labs  05/25/15 1845  BNP 630.3*    ProBNP (last 3 results)  Recent Labs  02/07/15 1355  PROBNP 564.0*    CBG:  Recent Labs Lab 01/18/16 1556 01/19/16 0734 01/19/16 1134  GLUCAP 150* 77 105*    No results found for this or any previous visit (from the past 240 hour(s)).   Studies: Dg Chest 2 View  01/18/2016  CLINICAL DATA:  Generalized weakness and cough EXAM: CHEST  2 VIEW COMPARISON:  11/16/2015 chest radiograph. FINDINGS: Stable cardiomediastinal silhouette with normal heart size. No pneumothorax. No pleural effusion. Emphysema. No pulmonary edema. No acute consolidative airspace disease. IMPRESSION: Emphysema.  Otherwise no active disease in the chest. Electronically Signed   By: Ilona Sorrel M.D.   On: 01/18/2016 17:29   Ct Head Wo Contrast  01/18/2016  CLINICAL DATA:  Progressive weakness and altered mental status. EXAM: CT HEAD WITHOUT CONTRAST TECHNIQUE: Contiguous axial images were obtained from the base of the skull through the vertex without intravenous contrast. COMPARISON:  05/25/2015 FINDINGS: There is no intracranial hemorrhage, mass or evidence of acute infarction. There is no extra-axial fluid collection. There is moderate generalized atrophy. There is white matter hypodensity consistent with small vessel ischemic disease. Chronic lacunar infarct in the right cerebellar hemisphere. Probable additional chronic lacunar infarct in the right putamen. No acute findings are evident. No interval change is evident. There is no bony abnormality. Visible paranasal sinuses are clear. Visible orbits are unremarkable. IMPRESSION: No acute intracranial findings. There is moderate generalized atrophy and small vessel ischemic changes. Electronically Signed   By: Andreas Newport M.D.   On: 01/18/2016 21:34   Mr Brain Wo Contrast  01/19/2016  CLINICAL DATA:  Lethargy with slurred  speech. Impaired gait. Symptoms for 24 hours. Seizure disorder. Diabetes. Hypertension. Anxiety, COPD. Cirrhosis. EXAM: MRI HEAD WITHOUT CONTRAST MRA HEAD WITHOUT CONTRAST TECHNIQUE: Multiplanar, multiecho pulse sequences of the brain and surrounding structures were obtained without intravenous contrast. Angiographic images of the head were obtained using MRA technique without contrast. COMPARISON:  CT head 01/18/2016. FINDINGS: MRI HEAD FINDINGS The patient was unable to remain motionless for the exam. Small or subtle lesions could be overlooked. No evidence for acute infarction, hemorrhage, mass lesion, hydrocephalus, or extra-axial fluid. Global atrophy. Extensive white matter disease. Chronic LEFT occipital infarct. Chronic RIGHT cerebellar infarct. Flow voids are maintained. Widespread areas of chronic hemorrhage throughout the cerebral hemispheres, cerebellum, and deep nuclei. No appreciable T1 shortening of any of the lesions. Findings most consistent with chronic microbleeds from longstanding hypertensive cerebrovascular disease. Cerebral amyloid angiopathy, chronic coagulopathy, or multiple cavernomas are less favored. No midline abnormality. Extracranial soft tissues unremarkable. BILATERAL cataract extraction. MRA HEAD FINDINGS Motion degraded exam. Internal carotid arteries widely patent. Basilar artery widely patent with vertebrals codominant. No intracranial stenosis or aneurysm. IMPRESSION: No acute stroke is evident.  Atrophy with small vessel disease. Widespread areas of chronic hemorrhage throughout the brain, favored to represent sequelae of longstanding hypertensive cerebrovascular disease. No proximal large vessel stenosis  or occlusion. Electronically Signed   By: Staci Righter M.D.   On: 01/19/2016 13:03   Mr Jodene Nam Head/brain Wo Cm  01/19/2016  CLINICAL DATA:  Lethargy with slurred speech. Impaired gait. Symptoms for 24 hours. Seizure disorder. Diabetes. Hypertension. Anxiety, COPD. Cirrhosis.  EXAM: MRI HEAD WITHOUT CONTRAST MRA HEAD WITHOUT CONTRAST TECHNIQUE: Multiplanar, multiecho pulse sequences of the brain and surrounding structures were obtained without intravenous contrast. Angiographic images of the head were obtained using MRA technique without contrast. COMPARISON:  CT head 01/18/2016. FINDINGS: MRI HEAD FINDINGS The patient was unable to remain motionless for the exam. Small or subtle lesions could be overlooked. No evidence for acute infarction, hemorrhage, mass lesion, hydrocephalus, or extra-axial fluid. Global atrophy. Extensive white matter disease. Chronic LEFT occipital infarct. Chronic RIGHT cerebellar infarct. Flow voids are maintained. Widespread areas of chronic hemorrhage throughout the cerebral hemispheres, cerebellum, and deep nuclei. No appreciable T1 shortening of any of the lesions. Findings most consistent with chronic microbleeds from longstanding hypertensive cerebrovascular disease. Cerebral amyloid angiopathy, chronic coagulopathy, or multiple cavernomas are less favored. No midline abnormality. Extracranial soft tissues unremarkable. BILATERAL cataract extraction. MRA HEAD FINDINGS Motion degraded exam. Internal carotid arteries widely patent. Basilar artery widely patent with vertebrals codominant. No intracranial stenosis or aneurysm. IMPRESSION: No acute stroke is evident.  Atrophy with small vessel disease. Widespread areas of chronic hemorrhage throughout the brain, favored to represent sequelae of longstanding hypertensive cerebrovascular disease. No proximal large vessel stenosis or occlusion. Electronically Signed   By: Staci Righter M.D.   On: 01/19/2016 13:03    Scheduled Meds: . arformoterol  15 mcg Nebulization Q12H  . clopidogrel  75 mg Oral Daily  . insulin aspart  0-9 Units Subcutaneous TID WC  . montelukast  10 mg Oral QHS  . multivitamin  1 tablet Oral BID  . pantoprazole  40 mg Oral Daily  . phenytoin  100 mg Oral QHS  . saccharomyces  boulardii  250 mg Oral Daily   Continuous Infusions: . sodium chloride      Principal Problem:   Acute encephalopathy Active Problems:   Type 2 diabetes mellitus with vascular disease (HCC)   COPD with chronic bronchitis (HCC)   Iron deficiency anemia    Diabetes mellitus type 2, uncomplicated (HCC)   Speech disturbance   Prolonged Q-T interval on ECG   Normocytic anemia    Time spent: 35 minutes    Kelvin Cellar  [facility] Pager [phone] If 7PM-7AM, please contact night-coverage at [email or web address], password [facility] 01/19/2016, 2:40 PM

## 2016-01-20 ENCOUNTER — Observation Stay (HOSPITAL_BASED_OUTPATIENT_CLINIC_OR_DEPARTMENT_OTHER): Payer: Medicare Other

## 2016-01-20 ENCOUNTER — Observation Stay (HOSPITAL_COMMUNITY)
Admit: 2016-01-20 | Discharge: 2016-01-20 | Disposition: A | Discharge status: Home / Self Care | Payer: Medicare Other | Attending: Neurology | Admitting: Neurology

## 2016-01-20 ENCOUNTER — Other Ambulatory Visit: Payer: Medicare Other

## 2016-01-20 DIAGNOSIS — I4581 Long QT syndrome: Secondary | ICD-10-CM | POA: Diagnosis present

## 2016-01-20 DIAGNOSIS — R2681 Unsteadiness on feet: Secondary | ICD-10-CM | POA: Diagnosis not present

## 2016-01-20 DIAGNOSIS — G9341 Metabolic encephalopathy: Secondary | ICD-10-CM | POA: Diagnosis present

## 2016-01-20 DIAGNOSIS — Z8249 Family history of ischemic heart disease and other diseases of the circulatory system: Secondary | ICD-10-CM | POA: Diagnosis not present

## 2016-01-20 DIAGNOSIS — Z66 Do not resuscitate: Secondary | ICD-10-CM | POA: Diagnosis present

## 2016-01-20 DIAGNOSIS — Z7984 Long term (current) use of oral hypoglycemic drugs: Secondary | ICD-10-CM | POA: Diagnosis not present

## 2016-01-20 DIAGNOSIS — E1159 Type 2 diabetes mellitus with other circulatory complications: Secondary | ICD-10-CM | POA: Diagnosis not present

## 2016-01-20 DIAGNOSIS — F419 Anxiety disorder, unspecified: Secondary | ICD-10-CM | POA: Diagnosis present

## 2016-01-20 DIAGNOSIS — G9340 Encephalopathy, unspecified: Secondary | ICD-10-CM | POA: Diagnosis not present

## 2016-01-20 DIAGNOSIS — E119 Type 2 diabetes mellitus without complications: Secondary | ICD-10-CM | POA: Diagnosis not present

## 2016-01-20 DIAGNOSIS — R9401 Abnormal electroencephalogram [EEG]: Secondary | ICD-10-CM | POA: Insufficient documentation

## 2016-01-20 DIAGNOSIS — K117 Disturbances of salivary secretion: Secondary | ICD-10-CM | POA: Diagnosis present

## 2016-01-20 DIAGNOSIS — I6789 Other cerebrovascular disease: Secondary | ICD-10-CM | POA: Diagnosis not present

## 2016-01-20 DIAGNOSIS — D509 Iron deficiency anemia, unspecified: Secondary | ICD-10-CM | POA: Diagnosis present

## 2016-01-20 DIAGNOSIS — I350 Nonrheumatic aortic (valve) stenosis: Secondary | ICD-10-CM | POA: Diagnosis present

## 2016-01-20 DIAGNOSIS — K219 Gastro-esophageal reflux disease without esophagitis: Secondary | ICD-10-CM | POA: Diagnosis present

## 2016-01-20 DIAGNOSIS — R627 Adult failure to thrive: Secondary | ICD-10-CM | POA: Diagnosis present

## 2016-01-20 DIAGNOSIS — I6529 Occlusion and stenosis of unspecified carotid artery: Secondary | ICD-10-CM | POA: Diagnosis present

## 2016-01-20 DIAGNOSIS — E440 Moderate protein-calorie malnutrition: Secondary | ICD-10-CM | POA: Diagnosis present

## 2016-01-20 DIAGNOSIS — Z8673 Personal history of transient ischemic attack (TIA), and cerebral infarction without residual deficits: Secondary | ICD-10-CM | POA: Diagnosis not present

## 2016-01-20 DIAGNOSIS — G4700 Insomnia, unspecified: Secondary | ICD-10-CM | POA: Diagnosis present

## 2016-01-20 DIAGNOSIS — F17210 Nicotine dependence, cigarettes, uncomplicated: Secondary | ICD-10-CM | POA: Diagnosis present

## 2016-01-20 DIAGNOSIS — R410 Disorientation, unspecified: Secondary | ICD-10-CM | POA: Diagnosis present

## 2016-01-20 DIAGNOSIS — R471 Dysarthria and anarthria: Secondary | ICD-10-CM | POA: Diagnosis present

## 2016-01-20 DIAGNOSIS — Z9049 Acquired absence of other specified parts of digestive tract: Secondary | ICD-10-CM | POA: Diagnosis not present

## 2016-01-20 DIAGNOSIS — F329 Major depressive disorder, single episode, unspecified: Secondary | ICD-10-CM | POA: Diagnosis present

## 2016-01-20 DIAGNOSIS — Z833 Family history of diabetes mellitus: Secondary | ICD-10-CM | POA: Diagnosis not present

## 2016-01-20 DIAGNOSIS — Z6822 Body mass index (BMI) 22.0-22.9, adult: Secondary | ICD-10-CM | POA: Diagnosis not present

## 2016-01-20 DIAGNOSIS — J449 Chronic obstructive pulmonary disease, unspecified: Secondary | ICD-10-CM | POA: Diagnosis present

## 2016-01-20 DIAGNOSIS — J45909 Unspecified asthma, uncomplicated: Secondary | ICD-10-CM | POA: Diagnosis present

## 2016-01-20 DIAGNOSIS — Z79899 Other long term (current) drug therapy: Secondary | ICD-10-CM | POA: Diagnosis not present

## 2016-01-20 DIAGNOSIS — G40909 Epilepsy, unspecified, not intractable, without status epilepticus: Secondary | ICD-10-CM | POA: Diagnosis present

## 2016-01-20 DIAGNOSIS — R479 Unspecified speech disturbances: Secondary | ICD-10-CM | POA: Diagnosis not present

## 2016-01-20 DIAGNOSIS — Z7902 Long term (current) use of antithrombotics/antiplatelets: Secondary | ICD-10-CM | POA: Diagnosis not present

## 2016-01-20 DIAGNOSIS — I739 Peripheral vascular disease, unspecified: Secondary | ICD-10-CM | POA: Diagnosis present

## 2016-01-20 LAB — URINE CULTURE: CULTURE: NO GROWTH

## 2016-01-20 LAB — CBC
HEMATOCRIT: 33 % — AB (ref 36.0–46.0)
Hemoglobin: 10.9 g/dL — ABNORMAL LOW (ref 12.0–15.0)
MCH: 29 pg (ref 26.0–34.0)
MCHC: 33 g/dL (ref 30.0–36.0)
MCV: 87.8 fL (ref 78.0–100.0)
Platelets: 124 10*3/uL — ABNORMAL LOW (ref 150–400)
RBC: 3.76 MIL/uL — AB (ref 3.87–5.11)
RDW: 17.2 % — AB (ref 11.5–15.5)
WBC: 7.6 10*3/uL (ref 4.0–10.5)

## 2016-01-20 LAB — GLUCOSE, CAPILLARY
GLUCOSE-CAPILLARY: 163 mg/dL — AB (ref 65–99)
GLUCOSE-CAPILLARY: 107 mg/dL — AB (ref 65–99)
Glucose-Capillary: 88 mg/dL (ref 65–99)
Glucose-Capillary: 85 mg/dL (ref 65–99)

## 2016-01-20 LAB — COMPREHENSIVE METABOLIC PANEL
ALBUMIN: 3 g/dL — AB (ref 3.5–5.0)
ALT: 17 U/L (ref 14–54)
AST: 46 U/L — ABNORMAL HIGH (ref 15–41)
Alkaline Phosphatase: 92 U/L (ref 38–126)
Anion gap: 5 (ref 5–15)
BUN: 11 mg/dL (ref 6–20)
CHLORIDE: 111 mmol/L (ref 101–111)
CO2: 25 mmol/L (ref 22–32)
CREATININE: 0.94 mg/dL (ref 0.44–1.00)
Calcium: 8 mg/dL — ABNORMAL LOW (ref 8.9–10.3)
GFR calc Af Amer: >60 mL/min (ref >60)
GFR calc non Af Amer: 54 mL/min — ABNORMAL LOW (ref >60)
GLUCOSE: 90 mg/dL (ref 65–99)
Potassium: 4.1 mmol/L (ref 3.5–5.1)
SODIUM: 141 mmol/L (ref 135–145)
Total Bilirubin: 0.9 mg/dL (ref 0.3–1.2)
Total Protein: 5.9 g/dL — ABNORMAL LOW (ref 6.5–8.1)

## 2016-01-20 LAB — FOLATE RBC
Folate, Hemolysate: 342 ng/mL
Folate, RBC: 1075 ng/mL (ref >498)
Hematocrit: 31.8 % — ABNORMAL LOW (ref 34.0–46.6)

## 2016-01-20 LAB — HEMOGLOBIN A1C
HEMOGLOBIN A1C: 5.3 % (ref 4.8–5.6)
MEAN PLASMA GLUCOSE: 105 mg/dL

## 2016-01-20 LAB — ECHOCARDIOGRAM COMPLETE
HEIGHTINCHES: 63 in
WEIGHTICAEL: 2000 [oz_av]

## 2016-01-20 LAB — MAGNESIUM: Magnesium: 1.4 mg/dL — ABNORMAL LOW (ref 1.7–2.4)

## 2016-01-20 MED ORDER — MAGNESIUM SULFATE 2 GM/50ML IV SOLN
2.0000 g | Freq: Once | INTRAVENOUS | Status: AC
  Administered 2016-01-20: 2 g via INTRAVENOUS
  Filled 2016-01-20: qty 50

## 2016-01-20 NOTE — Evaluation (Signed)
SLP Cancellation Note  Patient Details Name: ADILENI BROOKER MRN: [id number] DOB: 16-Nov-1928   Cancelled treatment:       Reason Eval/Treat Not Completed: Other (comment) (pt having an EEG at this time, will continue efforts)   Luanna Salk, [facility] SLP [phone]

## 2016-01-20 NOTE — Procedures (Signed)
HPI:  82 y/o with hx of seizure and MS change  TECHNICAL SUMMARY:  A multichannel referential and bipolar montage EEG using the standard international 10-20 system was performed on the patient described as confused.  There is a disorganized 5-6 Hz activity that is noted throughout all head regions.  There is not a single occipital dominant rhythm.    ACTIVATION:  Stepwise photic stimulation and hyperventilation were not performed.  EPILEPTIFORM ACTIVITY:  There were no spikes noted.  There were fairly frequent triphasic sharp waveforms noted throughout the recording.  SLEEP:  Physiologic drowsiness is noted.  CARDIAC:  The EKG lead revealed a regular rhythm.  IMPRESSION:  This is an abnormal EEG demonstrating a moderate diffuse slowing of electrocerebral activity.  This can be seen in a wide variety of encephalopathic state including those of a toxic, metabolic, or degenerative nature.  There were triphasic sharp waveforms, however, which would suggest that a metabolic etiology is the most likely etiology for the encephalopathic state.  There were no focal, hemispheric, or lateralizing features.  No epileptiform activity was recorded.

## 2016-01-20 NOTE — Progress Notes (Signed)
  Echocardiogram 2D Echocardiogram has been performed.  Jennette Dubin 01/20/2016, 11:42 AM

## 2016-01-20 NOTE — NC FL2 (Signed)
[place] LEVEL OF CARE SCREENING TOOL     IDENTIFICATION  Patient Name: Monique Fleming Birthdate: 10-11-1928 Sex: female Admission Date (Current Location): 01/18/2016  [facility] and [place] Number:  Herbalist and Address:  [facility],  [place] [address] [place]      Provider Number: [id number]  Attending Physician Name and Address:  Kelvin Cellar, MD  Relative Name and Phone Number:       Current Level of Care: Hospital Recommended Level of Care: [place] Prior Approval Number:    Date Approved/Denied:   PASRR Number:    Discharge Plan: SNF    Current Diagnoses: Patient Active Problem List   Diagnosis Date Noted  . Abnormal EEG   . Normocytic anemia   . Acute encephalopathy 01/18/2016  . Speech disturbance 01/18/2016  . Prolonged Q-T interval on ECG 01/18/2016  . Right shoulder pain 11/07/2015  . Elevated BP 07/09/2015  . Cold sore 04/25/2015  . Chronic respiratory failure with hypoxia ([place]) 04/25/2015  . Rib pain on left side 02/21/2015  . Popliteal pain 02/07/2015  . Pedal edema 02/07/2015  . Pain in joint, ankle and foot 02/07/2015  . Wheezing 12/21/2014  . Diabetes mellitus type 2, uncomplicated ([place]) [id number]  . Diabetes mellitus, type 2 ([place]) 12/21/2014  . Chest pain 08/24/2014  . Chest pain, mid sternal   . General weakness 08/22/2014  . Dehydration 08/22/2014  . Acute diarrhea 08/22/2014  . Hemorrhoids 08/22/2014  . Rectal bleeding 07/04/2014  . Nonspecific (abnormal) findings on radiological and other examination of gastrointestinal tract 07/04/2014  . Iron deficiency anemia  06/27/2014  . Mild cognitive disorder 04/03/2014  . COPD (chronic obstructive pulmonary disease) with chronic bronchitis Gold B 03/14/2014  . Depression, major, recurrent ([place]) 03/06/2014  . Macular degeneration 02/18/2014  . Anxiety state 01/08/2014  . Carotid disease, bilateral ([place]) 01/02/2014  . Burning  with urination 12/17/2013  . Aortic stenosis 12/17/2013  . Wounds, multiple 06/28/2013  . Thrombocytopenia ([place]) 04/01/2013  . Cerebral artery occlusion ([place]) 03/11/2013  . Cardiac disease 03/11/2013  . Nasal septal perforation 07/20/2012  . Hyperlipidemia 10/20/2011  . Reaction to Pneumovax immunization 03/07/2011  . ACTINIC KERATOSIS 08/22/2010  . NEOPLASM OF UNCERTAIN BEHAVIOR OF SKIN 05/28/2010  . ANGIODYSPLASIA-INTESTINE 02/25/2010  . ANEMIA-B12 DEFICIENCY 12/30/2009  . Megaloblastic anemia due to B12 deficiency 12/30/2009  . Iron deficiency anemia 12/23/2009  . BREAST PAIN 10/09/2009  . CIRRHOSIS 03/08/2009  . DIVERTICULOSIS-COLON 02/28/2009  . History of malignant neoplasm of large intestine 02/28/2009  . PERSONAL HX COLONIC POLYPS 02/28/2009  . UNSPECIFIED BREAST DISORDER 02/13/2009  . SEIZURE DISORDER 12/25/2008  . GRAND MAL SEIZURE 10/08/2008  . NECK PAIN 09/10/2008  . COPD with chronic bronchitis ([place]) 05/29/2008  . Malaise and fatigue 03/26/2008  . GERD 03/07/2008  . Irritable bowel syndrome 12/07/2007  . TOBACCO ABUSE, Hx of 10/05/2007  . INSOMNIA, CHRONIC 10/05/2007  . ATHEROSCLEROSIS 10/05/2007  . SLEEP RELATED MOVEMENT DISORDER UNSPECIFIED 10/05/2007  . Type 2 diabetes mellitus with vascular disease ([place]) 03/02/2007  . ANXIETY 03/02/2007  . PERIPHERAL VASCULAR DISEASE 03/02/2007  . RENAL CYST, LEFT 03/02/2007  . TRANSIENT ISCHEMIC ATTACK, HX OF 03/02/2007    Orientation RESPIRATION BLADDER Height & Weight     Situation, Time, Self, Place (oriented to place and self, disoriented to time and situation)  O2 (1L/min) Continent Weight: 125 lb 14.1 oz (57.1 kg) Height:  5\' 3"  (160 cm)  BEHAVIORAL SYMPTOMS/MOOD NEUROLOGICAL BOWEL NUTRITION  STATUS    Convulsions/Seizures (history of one seizure) Continent Diet (Carb modified)  AMBULATORY STATUS COMMUNICATION OF NEEDS Skin   Limited Assist Verbally (slurred speech) Bruising                       Personal  Care Assistance Level of Assistance  Bathing, Feeding, Dressing Bathing Assistance: Limited assistance Feeding assistance: Limited assistance Dressing Assistance: Limited assistance     Functional Limitations Info  Speech (sight and hearing adequate; slurred speech at admission) Sight Info: Adequate Hearing Info: Adequate Speech Info: Impaired (impaired speech at admission)    [place]  OT (By licensed OT), PT (By licensed PT)     PT Frequency: 5x/week OT Frequency: 3/week            Contractures Contractures Info: Not present    Additional Factors Info  Code Status, Allergies Code Status Info: DNR Allergies Info: Bactrim, Levofloxacin, Pneumovax, Amoxicillin-pot-Clavulanate, Penicillins, Buproprion, Haldol, Haloperidol, Morphine, Morphine and related, Pneumococcal vaccine, Pseufoephedrine HCl, Sudafed           Current Medications (01/20/2016):  This is the current hospital active medication list Current Facility-Administered Medications  Medication Dose Route Frequency Provider Last Rate Last Dose  . 0.9 %  sodium chloride infusion   Intravenous Continuous Kelvin Cellar, MD 75 mL/hr at 01/20/16 1144    . acetaminophen (TYLENOL) tablet 650 mg  650 mg Oral Q4H PRN Vianne Bulls, MD       Or  . acetaminophen (TYLENOL) suppository 650 mg  650 mg Rectal Q4H PRN Ilene Qua Opyd, MD      . albuterol (PROVENTIL) (2.5 MG/3ML) 0.083% nebulizer solution 2.5 mg  2.5 mg Nebulization Q4H PRN Vianne Bulls, MD   2.5 mg at 01/20/16 0552  . amLODipine (NORVASC) tablet 5 mg  5 mg Oral Daily Kelvin Cellar, MD   5 mg at 01/19/16 1659  . arformoterol (BROVANA) nebulizer solution 15 mcg  15 mcg Nebulization Q12H Vianne Bulls, MD   15 mcg at 01/20/16 0840  . clonazePAM (KLONOPIN) tablet 1 mg  1 mg Oral BID PRN Vianne Bulls, MD   1 mg at 01/19/16 0036  . clopidogrel (PLAVIX) tablet 75 mg  75 mg Oral Daily Vianne Bulls, MD   75 mg at 01/19/16 0940  .  hydrocortisone (ANUSOL-HC) suppository 25 mg  25 mg Rectal Daily PRN Ilene Qua Opyd, MD      . insulin aspart (novoLOG) injection 0-9 Units  0-9 Units Subcutaneous TID WC Vianne Bulls, MD   0 Units at 01/19/16 0800  . montelukast (SINGULAIR) tablet 10 mg  10 mg Oral QHS Vianne Bulls, MD   10 mg at 01/19/16 2118  . multivitamin (PROSIGHT) tablet 1 tablet  1 tablet Oral BID Vianne Bulls, MD   1 tablet at 01/20/16 1139  . pantoprazole (PROTONIX) EC tablet 40 mg  40 mg Oral Daily Vianne Bulls, MD   40 mg at 01/19/16 0941  . phenytoin (DILANTIN) ER capsule 100 mg  100 mg Oral QHS Vianne Bulls, MD   100 mg at 01/19/16 2118  . polyvinyl alcohol (LIQUIFILM TEARS) 1.4 % ophthalmic solution 1 drop  1 drop Both Eyes BID PRN Vianne Bulls, MD      . saccharomyces boulardii (FLORASTOR) capsule 250 mg  250 mg Oral Daily Vianne Bulls, MD   250 mg at 01/19/16 0940  . senna-docusate (Senokot-S) tablet 1 tablet  1 tablet Oral QHS PRN Vianne Bulls, MD         Discharge Medications: Please see discharge summary for a list of discharge medications.  Relevant Imaging Results:  Relevant Lab Results:   Additional Information    Beverely Pace, LCSW

## 2016-01-20 NOTE — Progress Notes (Signed)
[facility] PROGRESS NOTE  Monique Fleming [id number] DOB: October 27, 1928 DOA: 01/18/2016 PCP: Penni Homans, MD  Assessment/Plan: 1. Acute encephalopathy/failure to thrive -Monique Fleming is a pleasant 82 year old female who at baseline is highly functional, able to perform activities of daily living and still drives.-Sh -She presented with significant mental status changes -Initially there was concerns of this could represent CVA, worked up with CT scan of brain which was negative. This was followed up with an MRI of brain which did not reveal evidence of acute CVA. -Lab work has not shown evidence of infection that could precipitate delirium. Urinalysis was negative. Two-view chest x-ray did not reveal acute cardiopulmonary disease. She was afebrile. Labs revealed B-12 of 878 and TSH of 2.297. -Family members that she may be dehydrated having minimal by mouth intake over the last several days. Plan to continue IV fluids -Dr [name] her primary neurologist recommended a formal neurology consultation. Case discussed with Dr Nyoka Cowden.  -Will check an EEG  2.  Type 2 diabetes mellitus -She had been on metformin at home. -Blood sugars controlled during this hospitalization. Will continue holding metformin for now.  3.  History of chronic obstructive pulmonary disease. -Stable exam, did not have evidence of respiratory compromise. -Chest x-ray negative.  4. History of seizure disorder. -Continue phenytoin  5.  Hypomagnesemia -Initial labs reveal magnesium of 1.1 for which she was given 2 g of IV magnesium, repeat lab work showing magnesium 1.4. Will give an additional 2 g of IV magnesium today.  Code Status: DO NOT RESUSCITATE  Family Communication: Spoke to her daughter was present at bedside Disposition Plan: Anticipate discharge home versus SNF   Procedures:  Carotid Dopplers performed on 01/19/2016 Bilateral- 40-59% internal carotid artery stenosis. Prior to previous study on  01-08-2015 States stenosis of 50%-69% maybe used different crietria.  HPI/Subjective: Monique Fleming is a pleasant 82 year old female with history of seizure disorder, type 2 diabetes, currently resides in the community, admitted to medicine service on 01/18/2016 when she presented with state functional decline. Family members noted her to have increasing confusion, word finding difficulties, "not acting herself". At baseline they stated that she is highly functional and able to carry on with her activities of daily living and still drives. There was concerns for acute CVA for which she was placed on stroke protocol. Initial CT scan was negative. His was followed up with an MRI of brain which did not reveal acute CVA. Radiology reported chronic hemorrhages thought to represent sequela of long-standing hypertensive cerebrovascular disease.  Objective: Filed Vitals:   01/19/16 2138 01/20/16 0506  BP: 132/48 130/39  Pulse: 88 96  Temp: 99.1 F (37.3 C) 99.4 F (37.4 C)  Resp: 18 18    Intake/Output Summary (Last 24 hours) at 01/20/16 1512 Last data filed at 01/20/16 0504  Gross per 24 hour  Intake    510 ml  Output    200 ml  Net    310 ml   Filed Weights   01/18/16 2334  Weight: 57.1 kg (125 lb 14.1 oz)    Exam:   General:  No acute distress she is awake, appears confused at times having word finding difficulties, heart of hearing   Cardiovascular: Regular rate rhythm normal S1-S2   Respiratory: Normal respiratory effort lungs are clear   Abdomen: Soft nontender nondistended   Musculoskeletal: No edema  Neurological: On exam she had nonfocal neurologic findings, 5 of 5 muscle strength bilaterally no alteration to sensation. I did note some confusion  and word finding difficulties  Data Reviewed: Basic Metabolic Panel:  Recent Labs Lab 01/18/16 1655 01/19/16 0524 01/20/16 1059  NA 140  --  141  K 4.4  --  4.1  CL 104  --  111  CO2 27  --  25  GLUCOSE 165*  --  90  BUN  11  --  11  CREATININE 0.81  --  0.94  CALCIUM 8.6*  --  8.0*  MG  --  1.1* 1.4*   Liver Function Tests:  Recent Labs Lab 01/18/16 1655 01/20/16 1059  AST 54* 46*  ALT 18 17  ALKPHOS 92 92  BILITOT 0.9 0.9  PROT 6.5 5.9*  ALBUMIN 3.2* 3.0*   No results for input(s): LIPASE, AMYLASE in the last 168 hours. No results for input(s): AMMONIA in the last 168 hours. CBC:  Recent Labs Lab 01/18/16 1655 01/20/16 1059  WBC 4.9 7.6  NEUTROABS 3.8  --   HGB 11.1* 10.9*  HCT 34.0* 33.0*  MCV 87.2 87.8  PLT 94* 124*   Cardiac Enzymes:  Recent Labs Lab 01/18/16 1655  TROPONINI 0.03   BNP (last 3 results)  Recent Labs  05/25/15 1845  BNP 630.3*    ProBNP (last 3 results)  Recent Labs  02/07/15 1355  PROBNP 564.0*    CBG:  Recent Labs Lab 01/19/16 1134 01/19/16 1724 01/19/16 2136 01/20/16 0817 01/20/16 1157  GLUCAP 105* 88 108* 88 85    Recent Results (from the past 240 hour(s))  Urine culture     Status: None   Collection Time: 01/18/16  5:55 PM  Result Value Ref Range Status   Specimen Description URINE, CLEAN CATCH  Final   Special Requests NONE  Final   Culture   Final    NO GROWTH 1 DAY Performed at [facility]    Report Status 01/20/2016 FINAL  Final     Studies: Dg Chest 2 View  01/18/2016  CLINICAL DATA:  Generalized weakness and cough EXAM: CHEST  2 VIEW COMPARISON:  11/16/2015 chest radiograph. FINDINGS: Stable cardiomediastinal silhouette with normal heart size. No pneumothorax. No pleural effusion. Emphysema. No pulmonary edema. No acute consolidative airspace disease. IMPRESSION: Emphysema.  Otherwise no active disease in the chest. Electronically Signed   By: Ilona Sorrel M.D.   On: 01/18/2016 17:29   Ct Head Wo Contrast  01/18/2016  CLINICAL DATA:  Progressive weakness and altered mental status. EXAM: CT HEAD WITHOUT CONTRAST TECHNIQUE: Contiguous axial images were obtained from the base of the skull through the vertex  without intravenous contrast. COMPARISON:  05/25/2015 FINDINGS: There is no intracranial hemorrhage, mass or evidence of acute infarction. There is no extra-axial fluid collection. There is moderate generalized atrophy. There is white matter hypodensity consistent with small vessel ischemic disease. Chronic lacunar infarct in the right cerebellar hemisphere. Probable additional chronic lacunar infarct in the right putamen. No acute findings are evident. No interval change is evident. There is no bony abnormality. Visible paranasal sinuses are clear. Visible orbits are unremarkable. IMPRESSION: No acute intracranial findings. There is moderate generalized atrophy and small vessel ischemic changes. Electronically Signed   By: Andreas Newport M.D.   On: 01/18/2016 21:34   Mr Brain Wo Contrast  01/19/2016  CLINICAL DATA:  Lethargy with slurred speech. Impaired gait. Symptoms for 24 hours. Seizure disorder. Diabetes. Hypertension. Anxiety, COPD. Cirrhosis. EXAM: MRI HEAD WITHOUT CONTRAST MRA HEAD WITHOUT CONTRAST TECHNIQUE: Multiplanar, multiecho pulse sequences of the brain and surrounding structures were obtained without  intravenous contrast. Angiographic images of the head were obtained using MRA technique without contrast. COMPARISON:  CT head 01/18/2016. FINDINGS: MRI HEAD FINDINGS The patient was unable to remain motionless for the exam. Small or subtle lesions could be overlooked. No evidence for acute infarction, hemorrhage, mass lesion, hydrocephalus, or extra-axial fluid. Global atrophy. Extensive white matter disease. Chronic LEFT occipital infarct. Chronic RIGHT cerebellar infarct. Flow voids are maintained. Widespread areas of chronic hemorrhage throughout the cerebral hemispheres, cerebellum, and deep nuclei. No appreciable T1 shortening of any of the lesions. Findings most consistent with chronic microbleeds from longstanding hypertensive cerebrovascular disease. Cerebral amyloid angiopathy, chronic  coagulopathy, or multiple cavernomas are less favored. No midline abnormality. Extracranial soft tissues unremarkable. BILATERAL cataract extraction. MRA HEAD FINDINGS Motion degraded exam. Internal carotid arteries widely patent. Basilar artery widely patent with vertebrals codominant. No intracranial stenosis or aneurysm. IMPRESSION: No acute stroke is evident.  Atrophy with small vessel disease. Widespread areas of chronic hemorrhage throughout the brain, favored to represent sequelae of longstanding hypertensive cerebrovascular disease. No proximal large vessel stenosis or occlusion. Electronically Signed   By: Staci Righter M.D.   On: 01/19/2016 13:03   Mr Jodene Nam Head/brain Wo Cm  01/19/2016  CLINICAL DATA:  Lethargy with slurred speech. Impaired gait. Symptoms for 24 hours. Seizure disorder. Diabetes. Hypertension. Anxiety, COPD. Cirrhosis. EXAM: MRI HEAD WITHOUT CONTRAST MRA HEAD WITHOUT CONTRAST TECHNIQUE: Multiplanar, multiecho pulse sequences of the brain and surrounding structures were obtained without intravenous contrast. Angiographic images of the head were obtained using MRA technique without contrast. COMPARISON:  CT head 01/18/2016. FINDINGS: MRI HEAD FINDINGS The patient was unable to remain motionless for the exam. Small or subtle lesions could be overlooked. No evidence for acute infarction, hemorrhage, mass lesion, hydrocephalus, or extra-axial fluid. Global atrophy. Extensive white matter disease. Chronic LEFT occipital infarct. Chronic RIGHT cerebellar infarct. Flow voids are maintained. Widespread areas of chronic hemorrhage throughout the cerebral hemispheres, cerebellum, and deep nuclei. No appreciable T1 shortening of any of the lesions. Findings most consistent with chronic microbleeds from longstanding hypertensive cerebrovascular disease. Cerebral amyloid angiopathy, chronic coagulopathy, or multiple cavernomas are less favored. No midline abnormality. Extracranial soft tissues  unremarkable. BILATERAL cataract extraction. MRA HEAD FINDINGS Motion degraded exam. Internal carotid arteries widely patent. Basilar artery widely patent with vertebrals codominant. No intracranial stenosis or aneurysm. IMPRESSION: No acute stroke is evident.  Atrophy with small vessel disease. Widespread areas of chronic hemorrhage throughout the brain, favored to represent sequelae of longstanding hypertensive cerebrovascular disease. No proximal large vessel stenosis or occlusion. Electronically Signed   By: Staci Righter M.D.   On: 01/19/2016 13:03    Scheduled Meds: . amLODipine  5 mg Oral Daily  . arformoterol  15 mcg Nebulization Q12H  . clopidogrel  75 mg Oral Daily  . insulin aspart  0-9 Units Subcutaneous TID WC  . magnesium sulfate 1 - 4 g bolus IVPB  2 g Intravenous Once  . montelukast  10 mg Oral QHS  . multivitamin  1 tablet Oral BID  . pantoprazole  40 mg Oral Daily  . phenytoin  100 mg Oral QHS  . saccharomyces boulardii  250 mg Oral Daily   Continuous Infusions: . sodium chloride 75 mL/hr at 01/20/16 1144    Principal Problem:   Acute encephalopathy Active Problems:   Type 2 diabetes mellitus with vascular disease (HCC)   COPD with chronic bronchitis (HCC)   Iron deficiency anemia    Diabetes mellitus type 2, uncomplicated ([place])   Speech  disturbance   Prolonged Q-T interval on ECG   Normocytic anemia    Time spent: 25 minutes    Kelvin Cellar  [facility] Pager [phone] If 7PM-7AM, please contact night-coverage at [email or web address], password [facility] 01/20/2016, 3:12 PM

## 2016-01-20 NOTE — Progress Notes (Signed)
EEG Completed; Results Pending  

## 2016-01-20 NOTE — Evaluation (Signed)
Occupational Therapy Evaluation Patient Details Name: Monique Fleming MRN: [id number] DOB: 20-Jul-1929 Today's Date: 01/20/2016    History of Present Illness Monique Fleming is an 82 y.o. female who is followed by Dr [name] as out patient neurology. Patient was brought to hospital due to noted increased confusion and difficulty words. Possibly slurred words. She was placed in hospital and has found to have normal CMP, MRI, Dilantin level is <2.5 however she is only on 100 mg QHS. Currently she is awake, alert and bale to follow commands. Does not appear to be confused. Noted slurred speech due to Xerostomia.    Clinical Impression   Pt admitted with confusion. Pt currently with functional limitations due to the deficits listed below (see OT Problem List).  Pt will benefit from skilled OT to increase their safety and independence with ADL and functional mobility for ADL to facilitate discharge to venue listed below.      Follow Up Recommendations  Supervision/Assistance - 24 hour;Supervision - Intermittent;CIR;SNF;Home health OT;Other (comment) (limited eval - depending on progress)    Equipment Recommendations  Other (comment) (TBD)       Precautions / Restrictions Precautions Precautions: Fall      Mobility Bed Mobility Overal bed mobility: Needs Assistance Bed Mobility: Supine to Sit;Sit to Supine     Supine to sit: Mod assist Sit to supine: Mod assist      Transfers Overall transfer level: Needs assistance Equipment used: 2 person hand held assist Transfers: Sit to/from Stand Sit to Stand: Mod assist              Balance Overall balance assessment: Needs assistance Sitting-balance support: Single extremity supported Sitting balance-Leahy Scale: Fair   Postural control: Posterior lean Standing balance support: Bilateral upper extremity supported Standing balance-Leahy Scale: Fair                              ADL            pt mod A with self  feeding and holding coffee cup.  ADL eval limited due to ECHO coming for pt.  Will continue to assess.  No family present for eval.                                   Vision  Pt seemed to overshoot reaching for breakfast item - will continue to assess          Pertinent Vitals/Pain Pain Assessment: 0-10 Pain Location: stomach- pt stated she was [place] Pain Intervention(s): Other (comment) (helped pt eat sitting EOB until ECHO came)        Extremity/Trunk Assessment Upper Extremity Assessment Upper Extremity Assessment: Generalized weakness           Communication Communication Communication: Expressive difficulties;Receptive difficulties   Cognition Arousal/Alertness: Lethargic Behavior During Therapy: Flat affect Overall Cognitive Status: No family/caregiver present to determine baseline cognitive functioning                                Home Living Family/patient expects to be discharged to:: Private residence  OT Diagnosis: Generalized weakness;Altered mental status;Cognitive deficits   OT Problem List: Decreased strength;Decreased activity tolerance;Decreased cognition;Decreased safety awareness;Impaired balance (sitting and/or standing)   OT Treatment/Interventions: Self-care/ADL training;DME and/or AE instruction;Patient/family education;Therapeutic activities    OT Goals(Current goals can be found in the care plan section) Acute Rehab OT Goals Patient Stated Goal: did not state OT Goal Formulation: With patient Time For Goal Achievement: 02/03/16 Potential to Achieve Goals: Good  OT Frequency:     Barriers to D/C: Decreased caregiver support          Co-evaluation PT/OT/SLP Co-Evaluation/Treatment: Yes Reason for Co-Treatment: For patient/therapist safety   OT goals addressed during session: ADL's and self-care      End of Session Nurse Communication:  Mobility status  Activity Tolerance: Patient limited by fatigue;Other (comment) (ECHO came to assess pt) Patient left: in bed;with call bell/phone within reach;with nursing/sitter in room   Time: 1050-1105 OT Time Calculation (min): 15 min Charges:  OT General Charges $OT Visit: 1 Procedure OT Evaluation $OT Eval Moderate Complexity: 1 Procedure G-Codes: OT G-codes **NOT FOR INPATIENT CLASS** Functional Assessment Tool Used: clinical observation Functional Limitation: Self care Self Care Current Status [id number]): At least 80 percent but less than 100 percent impaired, limited or restricted Self Care Goal Status [id number]): At least 1 percent but less than 20 percent impaired, limited or restricted  [place], Thereasa Parkin 01/20/2016, 12:05 PM

## 2016-01-20 NOTE — Consult Note (Signed)
NEURO HOSPITALIST CONSULT NOTE   Requestig physician: Dr. Coralyn Pear   Reason for Consult: confusion   History obtained from:  Patient     HPI:                                                                                                                                          Monique Fleming is an 82 y.o. female who is followed by Dr [name] as out patient neurology. Patient was brought to hospital due to noted increased confusion and difficulty words. Possibly slurred words. She was placed in hospital and has found to have normal CMP, MRI, Dilantin level is <2.5 however she is only on 100 mg QHS. Currently she is awake, alert and bale to follow commands. Does not appear to be confused. Noted slurred speech due to Xerostomia.    Past Medical History  Diagnosis Date  . Aortic stenosis     moderate by echo 6/11 gradient 20/51mmHg for mean and peak  . TIA (transient ischemic attack)     Dr. [name]  . Peripheral vascular disease ([place])   . IBS (irritable bowel syndrome)   . Diabetes mellitus     type II  . Chronic bronchitis   . COPD (chronic obstructive pulmonary disease) ([place])   . Atherosclerosis   . Depression   . Anxiety     Dr Casimiro Needle  . Renal cyst   . Anemia     iron deficiency  . Cellulitis   . Diverticular disease   . Infectious diarrhea(009.2)   . Sinusitis   . Thrush   . Abnormal liver function test   . GERD (gastroesophageal reflux disease)   . Insomnia     chronic  . Adenocarcinoma ([place]) 2008    colon,s/p right hemicolectomy  . Angina   . Seizures (HCC)     hx of one seizure "  . Cirrhosis of liver (HCC)     hx of  . Clostridium difficile diarrhea   . Thrombocytopenia ([place]) 04/01/2013  . Bursitis     spine per pt  . Macular degeneration 02/18/2014    Follows with [facility]  . Internal hemorrhoids   . Asthma   . Elevated BP 07/09/2015  . Type 2 diabetes mellitus with vascular disease ([place]) 03/02/2007    Chronic      Past Surgical  History  Procedure Laterality Date  . Carotid endarterectomy    . Colectomy      right hemi  . Cholecystectomy  11/09  . Upper gastrointestinal endoscopy    . Tonsillectomy    . Appendectomy      Family History  Problem Relation Age of Onset  . Colon cancer Paternal Grandmother   . Esophageal cancer Brother   . Heart disease Mother   .  Diabetes Brother   . Hypertension Father   . Rectal cancer Neg Hx   . Stomach cancer Neg Hx       Social History:  reports that she has been smoking Cigarettes.  She started smoking about 64 years ago. She has a 35 pack-year smoking history. She has never used smokeless tobacco. She reports that she does not drink alcohol or use illicit drugs.  Allergies  Allergen Reactions  . Bactrim [Sulfamethoxazole-Trimethoprim] Shortness Of Breath  . Levofloxacin Other (See Comments)    seizure  . Pneumovax [Pneumococcal Polysaccharide Vaccine] Other (See Comments)    Severe allergy-cellulitis  . Amoxicillin-Pot Clavulanate Other (See Comments)    Patient stated Drs. recommend taking Augmentin due to her PCN allergy.  . Penicillins Rash    Has patient had a PCN reaction causing immediate rash, facial/tongue/throat swelling, SOB or lightheadedness with hypotension: No Has patient had a PCN reaction causing severe rash involving mucus membranes or skin necrosis: No Has patient had a PCN reaction that required hospitalization No Has patient had a PCN reaction occurring within the last 10 years: No If all of the above answers are "NO", then may proceed with Cephalosporin use.   . Bupropion   . Haldol [Haloperidol Lactate]     Had unknown reaction many yrs ago per pt  . Haloperidol   . Morphine   . Morphine And Related     Confused and agitation  . Pneumococcal Vaccine   . Pseudoephedrine Hcl   . Sudafed [Pseudoephedrine Hcl] Other (See Comments)    seizure    MEDICATIONS:                                                                                                                      Prior to Admission:  Prescriptions prior to admission  Medication Sig Dispense Refill Last Dose  . albuterol (PROAIR HFA) 108 (90 BASE) MCG/ACT inhaler Inhale 2 puffs into the lungs every 4 (four) hours as needed for wheezing or shortness of breath. 3 Inhaler 2 01/17/2016 at Unknown time  . cholestyramine (QUESTRAN) 4 g packet MIX 1 PACKET (4 GRAM TOTAL) IN LIQUID AND DRINK DAILY AS NEEDED FOR DIARRHEA 180 each 0 01/04/2016  . clonazePAM (KLONOPIN) 1 MG tablet Take 1 mg by mouth 2 (two) times daily as needed for anxiety.    01/17/2016 at Unknown time  . clopidogrel (PLAVIX) 75 MG tablet TAKE 1 TABLET DAILY 90 tablet 2 01/18/2016 at 1230  . desvenlafaxine (PRISTIQ) 100 MG 24 hr tablet Take 100 mg by mouth daily.   01/18/2016 at Unknown time  . ergocalciferol (VITAMIN D2) 50000 UNITS capsule Take 1 capsule (50,000 Units total) by mouth once a week. 4 capsule 4 01/18/2016  . fluticasone (FLONASE) 50 MCG/ACT nasal spray USE 2 SPRAYS IN EACH NOSTRIL DAILY AS NEEDED FOR ALLERGIES 48 g 2 Past Week at Unknown time  . formoterol (PERFOROMIST) 20 MCG/2ML nebulizer solution Take 2 mLs (20 mcg total) by nebulization 2 (two) times daily.  120 mL 5 01/18/2016 at Unknown time  . hydrocortisone (ANUSOL-HC) 25 MG suppository Use 1 suppository per rectum as needed. (Patient taking differently: Place 25 mg rectally daily as needed for hemorrhoids or itching. Use 1 suppository per rectum as needed.) 20 suppository 1 01/15/2016  . KLOR-CON M10 10 MEQ tablet TAKE 1 TABLET TWICE A DAY 180 tablet 1 01/18/2016 at Unknown time  . metFORMIN (GLUCOPHAGE) 500 MG tablet TAKE ONE-HALF (1/2) TO ONE TABLET DAILY WITH BREAKFAST 90 tablet 0 01/18/2016 at Unknown time  . montelukast (SINGULAIR) 10 MG tablet Take 10 mg by mouth at bedtime.   01/17/2016 at Unknown time  . Multiple Vitamins-Minerals (OCUVITE PRESERVISION) TABS Take by mouth 2 (two) times daily.   01/18/2016 at Unknown time  . ondansetron  (ZOFRAN) 4 MG tablet Take 1 tablet (4 mg total) by mouth every 6 (six) hours as needed for nausea or vomiting. 60 tablet 0 01/18/2016 at Unknown time  . pantoprazole (PROTONIX) 40 MG tablet Take 1 tab by mouth every morning. (Patient taking differently: Take 40 mg by mouth daily. Take 1 tab by mouth every morning.) 90 tablet 3 01/18/2016 at Unknown time  . phenytoin (DILANTIN) 100 MG ER capsule TAKE TWO CAPSULES BY MOUTH AT BEDTIME DAILY (Patient taking differently: 100mg  daily at bedtime) 60 capsule 3 01/17/2016 at Unknown time  . Polyethyl Glycol-Propyl Glycol (SYSTANE) 0.4-0.3 % SOLN Place 1 drop into both eyes 2 (two) times daily as needed (dry eyes).    01/17/2016 at Unknown time  . saccharomyces boulardii (FLORASTOR) 250 MG capsule Take 250 mg by mouth daily.   01/18/2016 at Unknown time  . sodium chloride (OCEAN) 0.65 % SOLN nasal spray Place 2 sprays into both nostrils daily as needed for congestion.    01/17/2016 at Unknown time  . traMADol (ULTRAM) 50 MG tablet TAKE ONE TABLET BY MOUTH EVERY 12 HOURS AS NEEDED FOR MODERATE PAIN OR SEVERE PAIN 30 tablet 1 Past Month at Unknown time  . HYDROcodone-acetaminophen (NORCO) 5-325 MG tablet Take 1 tablet by mouth every 6 (six) hours as needed for moderate pain. (Patient not taking: Reported on 01/18/2016) 40 tablet 0 Not Taking at Unknown time   Scheduled: . amLODipine  5 mg Oral Daily  . arformoterol  15 mcg Nebulization Q12H  . clopidogrel  75 mg Oral Daily  . insulin aspart  0-9 Units Subcutaneous TID WC  . montelukast  10 mg Oral QHS  . multivitamin  1 tablet Oral BID  . pantoprazole  40 mg Oral Daily  . phenytoin  100 mg Oral QHS  . saccharomyces boulardii  250 mg Oral Daily     ROS:                                                                                                                                       History obtained from the patient  General ROS: negative for -  chills, fatigue, fever, night sweats, weight gain or weight  loss Psychological ROS: negative for - behavioral disorder, hallucinations, memory difficulties, mood swings or suicidal ideation Ophthalmic ROS: negative for - blurry vision, double vision, eye pain or loss of vision ENT ROS: negative for - epistaxis, nasal discharge, oral lesions, sore throat, tinnitus or vertigo Allergy and Immunology ROS: negative for - hives or itchy/watery eyes Hematological and Lymphatic ROS: negative for - bleeding problems, bruising or swollen lymph nodes Endocrine ROS: negative for - galactorrhea, hair pattern changes, polydipsia/polyuria or temperature intolerance Respiratory ROS: negative for - cough, hemoptysis, shortness of breath or wheezing Cardiovascular ROS: negative for - chest pain, dyspnea on exertion, edema or irregular heartbeat Gastrointestinal ROS: negative for - abdominal pain, diarrhea, hematemesis, nausea/vomiting or stool incontinence Genito-Urinary ROS: negative for - dysuria, hematuria, incontinence or urinary frequency/urgency Musculoskeletal ROS: negative for - joint swelling or muscular weakness Neurological ROS: as noted in HPI Dermatological ROS: negative for rash and skin lesion changes   Blood pressure 130/39, pulse 96, temperature 99.4 F (37.4 C), temperature source Oral, resp. rate 18, height 5\' 3"  (1.6 m), weight 57.1 kg (125 lb 14.1 oz), SpO2 94 %.   Neurologic Examination:                                                                                                      HEENT-  Normocephalic, no lesions, without obvious abnormality.  Normal external eye and conjunctiva.  Normal TM's bilaterally.  Normal auditory canals and external ears. Normal external nose, mucus membranes and septum.  Normal pharynx. Cardiovascular- S1, S2 normal, pulses palpable throughout   Lungs- chest clear, no wheezing, rales, normal symmetric air entry Abdomen- normal findings: bowel sounds normal Extremities- no edema Lymph-no adenopathy  palpable Musculoskeletal-no joint tenderness, deformity or swelling Skin-warm and dry, no hyperpigmentation, vitiligo, or suspicious lesions  Neurological Examination Mental Status: Alert, oriented, thought content appropriate.  Speech dysarthric secondary to xerostomia without evidence of aphasia (able to name objects, repeat, follow commands and express herself).  Able to follow 3 step commands without difficulty. Cranial Nerves: II: Visual fields grossly normal, pupils equal, round, reactive to light and accommodation III,IV, VI: ptosis not present, extra-ocular motions intact bilaterally V,VII: smile symmetric, facial light touch sensation normal bilaterally VIII: hearing normal bilaterally IX,X: uvula rises symmetrically XI: bilateral shoulder shrug XII: midline tongue extension Motor: Moves all extremities antigravity Sensory: Pinprick and light touch intact throughout, bilaterally Deep Tendon Reflexes: 2+ and symmetric throughout Plantars: Mute bilaterally Cerebellar: normal finger-to-nose,  Gait: not tested      Lab Results: Basic Metabolic Panel:  Recent Labs Lab 01/18/16 1655 01/19/16 0524  NA 140  --   K 4.4  --   CL 104  --   CO2 27  --   GLUCOSE 165*  --   BUN 11  --   CREATININE 0.81  --   CALCIUM 8.6*  --   MG  --  1.1*    Liver Function Tests:  Recent Labs Lab 01/18/16 1655  AST 54*  ALT 18  ALKPHOS 92  BILITOT 0.9  PROT 6.5  ALBUMIN 3.2*   No results for input(s): LIPASE, AMYLASE in the last 168 hours. No results for input(s): AMMONIA in the last 168 hours.  CBC:  Recent Labs Lab 01/18/16 1655 01/20/16 1059  WBC 4.9 7.6  NEUTROABS 3.8  --   HGB 11.1* 10.9*  HCT 34.0* 33.0*  MCV 87.2 87.8  PLT 94* 124*    Cardiac Enzymes:  Recent Labs Lab 01/18/16 1655  TROPONINI 0.03    Lipid Panel:  Recent Labs Lab 01/19/16 0524  CHOL 118  TRIG 76  HDL 26*  CHOLHDL 4.5  VLDL 15  LDLCALC 77    CBG:  Recent Labs Lab  01/19/16 0734 01/19/16 1134 01/19/16 1724 01/19/16 2136 01/20/16 0817  GLUCAP 77 105* 88 108* 88    Microbiology: Results for orders placed or performed in visit on 02/15/15  Urine Culture     Status: None   Collection Time: 02/15/15 12:01 PM  Result Value Ref Range Status   Colony Count 5,000 COLONIES/ML  Final   Organism ID, Bacteria Insignificant Growth  Final   *Note: Due to a large number of results and/or encounters for the requested time period, some results have not been displayed. A complete set of results can be found in Results Review.    Coagulation Studies: No results for input(s): LABPROT, INR in the last 72 hours.  Imaging: Dg Chest 2 View  01/18/2016  CLINICAL DATA:  Generalized weakness and cough EXAM: CHEST  2 VIEW COMPARISON:  11/16/2015 chest radiograph. FINDINGS: Stable cardiomediastinal silhouette with normal heart size. No pneumothorax. No pleural effusion. Emphysema. No pulmonary edema. No acute consolidative airspace disease. IMPRESSION: Emphysema.  Otherwise no active disease in the chest. Electronically Signed   By: Ilona Sorrel M.D.   On: 01/18/2016 17:29   Ct Head Wo Contrast  01/18/2016  CLINICAL DATA:  Progressive weakness and altered mental status. EXAM: CT HEAD WITHOUT CONTRAST TECHNIQUE: Contiguous axial images were obtained from the base of the skull through the vertex without intravenous contrast. COMPARISON:  05/25/2015 FINDINGS: There is no intracranial hemorrhage, mass or evidence of acute infarction. There is no extra-axial fluid collection. There is moderate generalized atrophy. There is white matter hypodensity consistent with small vessel ischemic disease. Chronic lacunar infarct in the right cerebellar hemisphere. Probable additional chronic lacunar infarct in the right putamen. No acute findings are evident. No interval change is evident. There is no bony abnormality. Visible paranasal sinuses are clear. Visible orbits are unremarkable.  IMPRESSION: No acute intracranial findings. There is moderate generalized atrophy and small vessel ischemic changes. Electronically Signed   By: Andreas Newport M.D.   On: 01/18/2016 21:34   Mr Brain Wo Contrast  01/19/2016  CLINICAL DATA:  Lethargy with slurred speech. Impaired gait. Symptoms for 24 hours. Seizure disorder. Diabetes. Hypertension. Anxiety, COPD. Cirrhosis. EXAM: MRI HEAD WITHOUT CONTRAST MRA HEAD WITHOUT CONTRAST TECHNIQUE: Multiplanar, multiecho pulse sequences of the brain and surrounding structures were obtained without intravenous contrast. Angiographic images of the head were obtained using MRA technique without contrast. COMPARISON:  CT head 01/18/2016. FINDINGS: MRI HEAD FINDINGS The patient was unable to remain motionless for the exam. Small or subtle lesions could be overlooked. No evidence for acute infarction, hemorrhage, mass lesion, hydrocephalus, or extra-axial fluid. Global atrophy. Extensive white matter disease. Chronic LEFT occipital infarct. Chronic RIGHT cerebellar infarct. Flow voids are maintained. Widespread areas of chronic hemorrhage throughout the cerebral hemispheres, cerebellum, and deep nuclei. No appreciable T1 shortening of  any of the lesions. Findings most consistent with chronic microbleeds from longstanding hypertensive cerebrovascular disease. Cerebral amyloid angiopathy, chronic coagulopathy, or multiple cavernomas are less favored. No midline abnormality. Extracranial soft tissues unremarkable. BILATERAL cataract extraction. MRA HEAD FINDINGS Motion degraded exam. Internal carotid arteries widely patent. Basilar artery widely patent with vertebrals codominant. No intracranial stenosis or aneurysm. IMPRESSION: No acute stroke is evident.  Atrophy with small vessel disease. Widespread areas of chronic hemorrhage throughout the brain, favored to represent sequelae of longstanding hypertensive cerebrovascular disease. No proximal large vessel stenosis or  occlusion. Electronically Signed   By: Staci Righter M.D.   On: 01/19/2016 13:03   Mr Jodene Nam Head/brain Wo Cm  01/19/2016  CLINICAL DATA:  Lethargy with slurred speech. Impaired gait. Symptoms for 24 hours. Seizure disorder. Diabetes. Hypertension. Anxiety, COPD. Cirrhosis. EXAM: MRI HEAD WITHOUT CONTRAST MRA HEAD WITHOUT CONTRAST TECHNIQUE: Multiplanar, multiecho pulse sequences of the brain and surrounding structures were obtained without intravenous contrast. Angiographic images of the head were obtained using MRA technique without contrast. COMPARISON:  CT head 01/18/2016. FINDINGS: MRI HEAD FINDINGS The patient was unable to remain motionless for the exam. Small or subtle lesions could be overlooked. No evidence for acute infarction, hemorrhage, mass lesion, hydrocephalus, or extra-axial fluid. Global atrophy. Extensive white matter disease. Chronic LEFT occipital infarct. Chronic RIGHT cerebellar infarct. Flow voids are maintained. Widespread areas of chronic hemorrhage throughout the cerebral hemispheres, cerebellum, and deep nuclei. No appreciable T1 shortening of any of the lesions. Findings most consistent with chronic microbleeds from longstanding hypertensive cerebrovascular disease. Cerebral amyloid angiopathy, chronic coagulopathy, or multiple cavernomas are less favored. No midline abnormality. Extracranial soft tissues unremarkable. BILATERAL cataract extraction. MRA HEAD FINDINGS Motion degraded exam. Internal carotid arteries widely patent. Basilar artery widely patent with vertebrals codominant. No intracranial stenosis or aneurysm. IMPRESSION: No acute stroke is evident.  Atrophy with small vessel disease. Widespread areas of chronic hemorrhage throughout the brain, favored to represent sequelae of longstanding hypertensive cerebrovascular disease. No proximal large vessel stenosis or occlusion. Electronically Signed   By: Staci Righter M.D.   On: 01/19/2016 13:03       Assessment and plan  per attending neurologist  Etta Quill PA-C [facility] [phone]  01/20/2016, 11:28 AM   Assessment/Plan:  82 YO female brought to hospital for confusion and possible speech difficulty. Neuro exam is non-focal other than dysarthria secondary to xerostomia. MRI brain shows no acute abnormalities. Labs have been WNL and No UTI noted with cultures pending.   Recommend: 1) EEG  Will be seen by Dr. Silverio Decamp. Please see his attestation note for A/P for any additional work up recommendations.      Addendum: EEG showed no epileptiform activity. Likely progression of undiagnosed neurocognitive decline. As Dementia cannot be diagnosed in hospital would recommend patient return to Dr. [name] for formal evaluation in non-hospital setting.

## 2016-01-20 NOTE — Evaluation (Signed)
Physical Therapy Evaluation Patient Details Name: Monique Fleming MRN: [id number] DOB: [birth year] Today's Date: 01/20/2016   History of Present Illness  Monique Fleming is an 82 y.o. female adm d/t  increased confusion and word finding difficulty; PMH of seizure disorder, anxiety, type 2 diabetes mellitus, and COPD   Clinical Impression  Pt admitted with above diagnosis. Pt currently with functional limitations due to the deficits listed below (see PT Problem List).  Pt will benefit from skilled PT to increase their independence and safety with mobility to allow discharge to the venue listed below.       Follow Up Recommendations SNF    Equipment Recommendations  Other (comment) (TBD)    Recommendations for Other Services       Precautions / Restrictions Precautions Precautions: Fall      Mobility  Bed Mobility Overal bed mobility: Needs Assistance Bed Mobility: Supine to Sit;Sit to Supine     Supine to sit: Mod assist;HOB elevated Sit to supine: Mod assist      Transfers Overall transfer level: Needs assistance Equipment used: 2 person hand held assist Transfers: Sit to/from Stand Sit to Stand: Mod assist         General transfer comment: assist to rise and stabilize  Ambulation/Gait             General Gait Details: NT d/t echo and lab staff needed pt back to bed (3 lateral steps along EOB with min/mod assist and multi-modal cues for sequencing  Stairs            Wheelchair Mobility    Modified Rankin (Stroke Patients Only)       Balance Overall balance assessment: Needs assistance Sitting-balance support: No upper extremity supported;Feet supported Sitting balance-Leahy Scale: Fair   Postural control: Posterior lean Standing balance support: Bilateral upper extremity supported Standing balance-Leahy Scale: Fair                               Pertinent Vitals/Pain Pain Assessment: 0-10 Pain Location: stomach, pt states she  was [place] Pain Intervention(s):  (assisted with meal on EOB until staff arrived  from echo and lab)    [place] expects to be discharged to:: Private residence Living Arrangements: Alone                    Prior Function           Comments: pt reports independence, chart states she still drives; no family present to determine/validate PLOF and pt not currently accurate historian     Hand Dominance        Extremity/Trunk Assessment   Upper Extremity Assessment: Generalized weakness           Lower Extremity Assessment: Generalized weakness         Communication   Communication: Expressive difficulties;Receptive difficulties  Cognition Arousal/Alertness:  (sleepy) Behavior During Therapy: Flat affect Overall Cognitive Status: No family/caregiver present to determine baseline cognitive functioning Area of Impairment: Following commands;Problem solving       Following Commands: Follows one step commands with increased time     Problem Solving: Slow processing;Decreased initiation;Difficulty sequencing;Requires verbal cues;Requires tactile cues      General Comments      Exercises        Assessment/Plan    PT Assessment Patient needs continued PT services  PT Diagnosis Difficulty walking;Generalized weakness   PT Problem List Decreased strength;Decreased  activity tolerance;Decreased mobility;Decreased safety awareness  PT Treatment Interventions DME instruction;Gait training;Functional mobility training;Therapeutic activities;Therapeutic exercise;Patient/family education   PT Goals (Current goals can be found in the Care Plan section) Acute Rehab PT Goals Patient Stated Goal: did not state PT Goal Formulation: With patient Time For Goal Achievement: 01/27/16 Potential to Achieve Goals: Good    Frequency Min 3X/week   Barriers to discharge        Co-evaluation PT/OT/SLP Co-Evaluation/Treatment: Yes Reason for  Co-Treatment: For patient/therapist safety PT goals addressed during session: Mobility/safety with mobility OT goals addressed during session: ADL's and self-care       End of Session   Activity Tolerance: Patient tolerated treatment well Patient left: in bed;with call bell/phone within reach;with bed alarm set      Functional Assessment Tool Used: clinical judgement  Functional Limitation: Mobility: Walking and moving around Mobility: Walking and Moving Around Current Status [id number]): At least 40 percent but less than 60 percent impaired, limited or restricted Mobility: Walking and Moving Around Goal Status [phone]): At least 20 percent but less than 40 percent impaired, limited or restricted    Time: 1051-1103 PT Time Calculation (min) (ACUTE ONLY): 12 min   Charges:   PT Evaluation $PT Eval Moderate Complexity: 1 Procedure     PT G Codes:   PT G-Codes **NOT FOR INPATIENT CLASS** Functional Assessment Tool Used: clinical judgement  Functional Limitation: Mobility: Walking and moving around Mobility: Walking and Moving Around Current Status [id number]): At least 40 percent but less than 60 percent impaired, limited or restricted Mobility: Walking and Moving Around Goal Status [phone]): At least 20 percent but less than 40 percent impaired, limited or restricted    [facility] 01/20/2016, 1:32 PM

## 2016-01-21 LAB — CBC
HCT: 31.3 % — ABNORMAL LOW (ref 36.0–46.0)
Hemoglobin: 10.3 g/dL — ABNORMAL LOW (ref 12.0–15.0)
MCH: 28.8 pg (ref 26.0–34.0)
MCHC: 32.9 g/dL (ref 30.0–36.0)
MCV: 87.4 fL (ref 78.0–100.0)
PLATELETS: 114 10*3/uL — AB (ref 150–400)
RBC: 3.58 MIL/uL — ABNORMAL LOW (ref 3.87–5.11)
RDW: 17.1 % — AB (ref 11.5–15.5)
WBC: 7.3 10*3/uL (ref 4.0–10.5)

## 2016-01-21 LAB — OCCULT BLOOD X 1 CARD TO LAB, STOOL
FECAL OCCULT BLD: POSITIVE — AB
Fecal Occult Bld: POSITIVE — AB

## 2016-01-21 LAB — GLUCOSE, CAPILLARY
Glucose-Capillary: 110 mg/dL — ABNORMAL HIGH (ref 65–99)
Glucose-Capillary: 295 mg/dL — ABNORMAL HIGH (ref 65–99)
Glucose-Capillary: 139 mg/dL — ABNORMAL HIGH (ref 65–99)
Glucose-Capillary: 174 mg/dL — ABNORMAL HIGH (ref 65–99)

## 2016-01-21 LAB — MAGNESIUM: Magnesium: 1.5 mg/dL — ABNORMAL LOW (ref 1.7–2.4)

## 2016-01-21 MED ORDER — MAGNESIUM SULFATE 50 % IJ SOLN
3.0000 g | Freq: Once | INTRAMUSCULAR | Status: AC
  Administered 2016-01-21: 3 g via INTRAVENOUS
  Filled 2016-01-21: qty 6

## 2016-01-21 MED ORDER — ENSURE ENLIVE PO LIQD
237.0000 mL | Freq: Three times a day (TID) | ORAL | Status: DC
  Administered 2016-01-21 – 2016-01-23 (×5): 237 mL via ORAL

## 2016-01-21 MED ORDER — MAGNESIUM SULFATE 2 GM/50ML IV SOLN: 2.0000 g | Freq: Once | INTRAVENOUS | Status: DC

## 2016-01-21 MED ORDER — CHOLESTYRAMINE 4 G PO PACK
4.0000 g | PACK | Freq: Two times a day (BID) | ORAL | Status: DC
  Administered 2016-01-21 – 2016-01-23 (×3): 4 g via ORAL
  Filled 2016-01-21 (×6): qty 1

## 2016-01-21 MED ORDER — METFORMIN HCL 500 MG PO TABS
500.0000 mg | ORAL_TABLET | Freq: Two times a day (BID) | ORAL | Status: DC
  Administered 2016-01-21 – 2016-01-23 (×4): 500 mg via ORAL
  Filled 2016-01-21 (×4): qty 1

## 2016-01-21 NOTE — Clinical Social Work Note (Signed)
Clinical Social Work Assessment  Patient Details  Name: Monique Fleming MRN: [id number] Date of Birth: [birth year]  Date of referral:  01/21/16               Reason for consult:  Facility Placement                Permission sought to share information with:  Facility Art therapist granted to share information::  Yes, Verbal Permission Granted  Name::        Agency::     Relationship::     Contact Information:     Housing/Transportation Living arrangements for the past 2 months:  [place] of Information:  Patient, Adult Children Patient Interpreter Needed:  None Criminal Activity/Legal Involvement Pertinent to Current Situation/Hospitalization:  No - Comment as needed Significant Relationships:  Adult Children Lives with:  Adult Children Do you feel safe going back to the place where you live?  Yes Need for family participation in patient care:  No (Coment)  Care giving concerns:  CSW reviewed PT evaluation recommending SNF at discharge.    Social Worker assessment / plan:  CSW spoke with patient & daughters, [name] & [name] at bedside to confirm that they are all agreeable with plan for SNF.   Employment status:  Retired Forensic scientist:  [employer] PT Recommendations:  [place] / Referral to community resources:  [place]  Patient/Family's Response to care:  Patient's daughters requested [employer]. CSW confirmed with Ivin Booty at [facility] that they would be able to offer a bed.   Patient/Family's Understanding of and Emotional Response to Diagnosis, Current Treatment, and Prognosis:    Emotional Assessment Appearance:  Appears stated age Attitude/Demeanor/Rapport:    Affect (typically observed):    Orientation:  Oriented to Self, Oriented to Place, Oriented to  Time Alcohol / Substance use:    Psych involvement (Current and /or in the community):     Discharge Needs  Concerns to be  addressed:    Readmission within the last 30 days:    Current discharge risk:    Barriers to Discharge:      Standley Brooking, LCSW 01/21/2016, 5:05 PM

## 2016-01-21 NOTE — Clinical Social Work Placement (Signed)
Patient has a bed at [facility]. CSW has completed FL2 & will continue to follow and assist with discharge when ready.    Raynaldo Opitz, [facility] Clinical Social Worker cell #: [phone]     CLINICAL SOCIAL WORK PLACEMENT  NOTE  Date:  01/21/2016  Patient Details  Name: Monique Fleming MRN: [id number] Date of Birth: 1929-06-03  Clinical Social Work is seeking post-discharge placement for this patient at the [place] level of care (*CSW will initial, date and re-position this form in  chart as items are completed):  Yes   Patient/family provided with [place] Work Department's list of facilities offering this level of care within the geographic area requested by the patient (or if unable, by the patient's family).  Yes   Patient/family informed of their freedom to choose among providers that offer the needed level of care, that participate in Medicare, Medicaid or managed care program needed by the patient, have an available bed and are willing to accept the patient.  Yes   Patient/family informed of [facility]'s ownership interest in [facility] and [facility], as well as of the fact that they are under no obligation to receive care at these facilities.  PASRR submitted to EDS on 01/21/16     PASRR number received on 01/21/16     Existing PASRR number confirmed on       FL2 transmitted to all facilities in geographic area requested by pt/family on 01/21/16     FL2 transmitted to all facilities within larger geographic area on       Patient informed that his/her managed care company has contracts with or will negotiate with certain facilities, including the following:        Yes   Patient/family informed of bed offers received.  Patient chooses bed at [facility]     Physician recommends and patient chooses bed at      Patient to be transferred to [facility] on  .  Patient to be transferred  to facility by       Patient family notified on   of transfer.  Name of family member notified:        PHYSICIAN       Additional Comment:    _______________________________________________ Standley Brooking, LCSW 01/21/2016, 5:07 PM

## 2016-01-21 NOTE — Progress Notes (Signed)
[facility] PROGRESS NOTE  Monique Fleming [id number] DOB: 1929-08-11 DOA: 01/18/2016 PCP: Penni Homans, MD  Interim summary Monique Fleming is a pleasant 82 year old female with history of seizure disorder, type 2 diabetes, currently resides in the community, admitted to medicine service on 01/18/2016 when she presented with state functional decline. Family members noted her to have increasing confusion, word finding difficulties, "not acting herself". At baseline they stated that she is highly functional and able to carry on with her activities of daily living and still drives. There was concerns for acute CVA for which she was placed on stroke protocol. Initial CT scan was negative. His was followed up with an MRI of brain which did not reveal acute CVA. Radiology reported chronic hemorrhages thought to represent sequela of long-standing hypertensive cerebrovascular disease. Neurology consulted and recommended obtaining EEG which showed findings consistent with metabolic encephalopathy. Neurology did not recommend further inpatient neurologic workup. Lab work only showing hypomagnesemia. She did not have other significant electrolyte abnormalities, no evidence of infection seen a UA or chest x-ray, white count within normal limits. Plan to replace IV magnesium today. Reassess tomorrow, family expressed preferrence to take her home as opposed to SNF. Reassess in am, follow up with PT.   Assessment/Plan: 1. Acute encephalopathy/failure to thrive -Monique Fleming is a pleasant 82 year old female who at baseline is highly functional, able to perform activities of daily living and still drives.-Sh -She presented with significant mental status changes -Initially there was concerns of this could represent CVA, worked up with CT scan of brain which was negative. This was followed up with an MRI of brain which did not reveal evidence of acute CVA. -Lab work has not shown evidence of infection that could  precipitate delirium. Urinalysis was negative. Two-view chest x-ray did not reveal acute cardiopulmonary disease. She has been afebrile. Labs revealed B-12 of 878 and TSH of 2.297. -Lab work did reveal hypomagnesemia with a magnesium of 1.1. This is currently being replaced. I think hypomagnesemia could be a reflection of severe malnutrition as family members have reported her having minimal by mouth intake. -Delirium related to hypomagnesemia?  -Replace in IV magnesium today -EEG showing fundus consistent with metabolic encephalopathy. Neurology did not recommend further inpatient neurologic workup and signed off. -Reassess in a.m., family members expressing preference to take her home rather than skilled nursing facility placement.  2.  Type 2 diabetes mellitus -She had been on metformin at home. -Blood sugars increased, will restart her metformin 500 mg by mouth twice a day -Continue sliding scale coverage  3.  History of chronic obstructive pulmonary disease. -Stable exam, did not have evidence of respiratory compromise. -Chest x-ray negative.  4. History of seizure disorder. -Continue phenytoin  5.  Hypomagnesemia -Initial labs revealed magnesium of 1.1 for which she was given 2 g of IV magnesium, repeat lab work showing magnesium 1.4. -Will give 3 g of IV magnesium today repeat labs in a.m.  6.  Protein calorie malnutrition -Will start protein boost 3 times a day  Code Status: DO NOT RESUSCITATE  Family Communication: Spoke to her daughter was present at bedside Disposition Plan: Anticipate discharge home versus SNF   Procedures:  Carotid Dopplers performed on 01/19/2016 Bilateral- 40-59% internal carotid artery stenosis. Prior to previous study on 01-08-2015 States stenosis of 50%-69% maybe used different crietria.  HPI/Subjective:   Objective: Filed Vitals:   01/21/16 0848 01/21/16 1400  BP:  148/83  Pulse: 87 98  Temp:  97.8 F (36.6 C)  Resp: 16 18     Intake/Output Summary (Last 24 hours) at 01/21/16 1507 Last data filed at 01/21/16 0400  Gross per 24 hour  Intake    905 ml  Output    660 ml  Net    245 ml   Filed Weights   01/18/16 2334  Weight: 57.1 kg (125 lb 14.1 oz)    Exam:   General:  No acute distress she is awake, mildly confused, she was ambulated down the hallway and back  Cardiovascular: Regular rate rhythm normal S1-S2   Respiratory: Normal respiratory effort lungs are clear   Abdomen: Soft nontender nondistended   Musculoskeletal: No edema  Neurological: Patient having nonfocal neurologic examination  Data Reviewed: Basic Metabolic Panel:  Recent Labs Lab 01/18/16 1655 01/19/16 0524 01/20/16 1059 01/21/16 0445  NA 140  --  141  --   K 4.4  --  4.1  --   CL 104  --  111  --   CO2 27  --  25  --   GLUCOSE 165*  --  90  --   BUN 11  --  11  --   CREATININE 0.81  --  0.94  --   CALCIUM 8.6*  --  8.0*  --   MG  --  1.1* 1.4* 1.5*   Liver Function Tests:  Recent Labs Lab 01/18/16 1655 01/20/16 1059  AST 54* 46*  ALT 18 17  ALKPHOS 92 92  BILITOT 0.9 0.9  PROT 6.5 5.9*  ALBUMIN 3.2* 3.0*   No results for input(s): LIPASE, AMYLASE in the last 168 hours. No results for input(s): AMMONIA in the last 168 hours. CBC:  Recent Labs Lab 01/18/16 1655 01/19/16 0524 01/20/16 1059 01/21/16 0445  WBC 4.9  --  7.6 7.3  NEUTROABS 3.8  --   --   --   HGB 11.1*  --  10.9* 10.3*  HCT 34.0* 31.8* 33.0* 31.3*  MCV 87.2  --  87.8 87.4  PLT 94*  --  124* 114*   Cardiac Enzymes:  Recent Labs Lab 01/18/16 1655  TROPONINI 0.03   BNP (last 3 results)  Recent Labs  05/25/15 1845  BNP 630.3*    ProBNP (last 3 results)  Recent Labs  02/07/15 1355  PROBNP 564.0*    CBG:  Recent Labs Lab 01/20/16 1157 01/20/16 1653 01/20/16 2051 01/21/16 0802 01/21/16 1207  GLUCAP 85 163* 107* 110* 295*    Recent Results (from the past 240 hour(s))  Urine culture     Status: None    Collection Time: 01/18/16  5:55 PM  Result Value Ref Range Status   Specimen Description URINE, CLEAN CATCH  Final   Special Requests NONE  Final   Culture   Final    NO GROWTH 1 DAY Performed at [facility]    Report Status 01/20/2016 FINAL  Final     Studies: No results found.  Scheduled Meds: . amLODipine  5 mg Oral Daily  . arformoterol  15 mcg Nebulization Q12H  . clopidogrel  75 mg Oral Daily  . feeding supplement (ENSURE ENLIVE)  237 mL Oral TID BM  . insulin aspart  0-9 Units Subcutaneous TID WC  . montelukast  10 mg Oral QHS  . multivitamin  1 tablet Oral BID  . pantoprazole  40 mg Oral Daily  . phenytoin  100 mg Oral QHS  . saccharomyces boulardii  250 mg Oral Daily   Continuous Infusions: . sodium chloride  75 mL/hr at 01/20/16 1144    Principal Problem:   Acute encephalopathy Active Problems:   Type 2 diabetes mellitus with vascular disease (HCC)   COPD with chronic bronchitis (HCC)   Iron deficiency anemia    Diabetes mellitus type 2, uncomplicated (HCC)   Speech disturbance   Prolonged Q-T interval on ECG   Normocytic anemia    Time spent: 25 minutes    Kelvin Cellar  [facility] Pager [phone] If 7PM-7AM, please contact night-coverage at [email or web address], password [facility] 01/21/2016, 3:07 PM  LOS: 1 day

## 2016-01-22 ENCOUNTER — Encounter: Payer: Self-pay | Admitting: Hematology & Oncology

## 2016-01-22 DIAGNOSIS — G9340 Encephalopathy, unspecified: Secondary | ICD-10-CM

## 2016-01-22 DIAGNOSIS — K219 Gastro-esophageal reflux disease without esophagitis: Secondary | ICD-10-CM

## 2016-01-22 DIAGNOSIS — D509 Iron deficiency anemia, unspecified: Secondary | ICD-10-CM

## 2016-01-22 DIAGNOSIS — R569 Unspecified convulsions: Secondary | ICD-10-CM | POA: Insufficient documentation

## 2016-01-22 DIAGNOSIS — F418 Other specified anxiety disorders: Secondary | ICD-10-CM

## 2016-01-22 DIAGNOSIS — R2681 Unsteadiness on feet: Secondary | ICD-10-CM | POA: Insufficient documentation

## 2016-01-22 DIAGNOSIS — E119 Type 2 diabetes mellitus without complications: Secondary | ICD-10-CM

## 2016-01-22 LAB — COMPREHENSIVE METABOLIC PANEL
ALBUMIN: 2.7 g/dL — AB (ref 3.5–5.0)
ALT: 14 U/L (ref 14–54)
ANION GAP: 6 (ref 5–15)
AST: 36 U/L (ref 15–41)
Alkaline Phosphatase: 76 U/L (ref 38–126)
BUN: 8 mg/dL (ref 6–20)
CO2: 23 mmol/L (ref 22–32)
Calcium: 8.2 mg/dL — ABNORMAL LOW (ref 8.9–10.3)
Chloride: 116 mmol/L — ABNORMAL HIGH (ref 101–111)
Creatinine, Ser: 0.77 mg/dL (ref 0.44–1.00)
GFR calc Af Amer: >60 mL/min (ref >60)
GFR calc non Af Amer: >60 mL/min (ref >60)
GLUCOSE: 188 mg/dL — AB (ref 65–99)
POTASSIUM: 3.6 mmol/L (ref 3.5–5.1)
SODIUM: 145 mmol/L (ref 135–145)
TOTAL PROTEIN: 5.7 g/dL — AB (ref 6.5–8.1)
Total Bilirubin: 0.6 mg/dL (ref 0.3–1.2)

## 2016-01-22 LAB — CBC
HEMATOCRIT: 31.1 % — AB (ref 36.0–46.0)
HEMOGLOBIN: 10.2 g/dL — AB (ref 12.0–15.0)
MCH: 29.1 pg (ref 26.0–34.0)
MCHC: 32.8 g/dL (ref 30.0–36.0)
MCV: 88.6 fL (ref 78.0–100.0)
Platelets: 114 10*3/uL — ABNORMAL LOW (ref 150–400)
RBC: 3.51 MIL/uL — ABNORMAL LOW (ref 3.87–5.11)
RDW: 17.6 % — ABNORMAL HIGH (ref 11.5–15.5)
WBC: 6.6 10*3/uL (ref 4.0–10.5)

## 2016-01-22 LAB — GLUCOSE, CAPILLARY
Glucose-Capillary: 97 mg/dL (ref 65–99)
Glucose-Capillary: 118 mg/dL — ABNORMAL HIGH (ref 65–99)
Glucose-Capillary: 129 mg/dL — ABNORMAL HIGH (ref 65–99)
Glucose-Capillary: 138 mg/dL — ABNORMAL HIGH (ref 65–99)

## 2016-01-22 LAB — MAGNESIUM: Magnesium: 1.7 mg/dL (ref 1.7–2.4)

## 2016-01-22 LAB — HEPATIC FUNCTION PANEL: Bilirubin, Total: 0.6 mg/dL

## 2016-01-22 LAB — BASIC METABOLIC PANEL
BUN: 8 mg/dL (ref 4–21)
Creatinine: 0.8 mg/dL (ref 0.5–1.1)
GLUCOSE: 188 mg/dL
Sodium: 145 mmol/L (ref 137–147)

## 2016-01-22 LAB — CBC AND DIFFERENTIAL: WBC: 6.6 10^3/mL

## 2016-01-22 MED ORDER — SODIUM CHLORIDE 0.9 % IV SOLN
510.0000 mg | Freq: Once | INTRAVENOUS | Status: AC
  Administered 2016-01-22: 510 mg via INTRAVENOUS
  Filled 2016-01-22: qty 17

## 2016-01-22 MED ORDER — ENSURE ENLIVE PO LIQD: 237.0000 mL | Freq: Three times a day (TID) | ORAL | Status: DC

## 2016-01-22 MED ORDER — MAGNESIUM OXIDE 400 (241.3 MG) MG PO TABS
400.0000 mg | ORAL_TABLET | Freq: Every day | ORAL | Status: DC
  Administered 2016-01-22 – 2016-01-23 (×2): 400 mg via ORAL
  Filled 2016-01-22 (×2): qty 1

## 2016-01-22 MED ORDER — AMLODIPINE BESYLATE 5 MG PO TABS: 5.0000 mg | ORAL_TABLET | Freq: Every day | ORAL | Status: DC

## 2016-01-22 MED ORDER — MAGNESIUM OXIDE 400 (241.3 MG) MG PO TABS: 400.0000 mg | ORAL_TABLET | Freq: Every day | ORAL | Status: DC

## 2016-01-22 MED ORDER — CLONAZEPAM 1 MG PO TABS: 1.0000 mg | ORAL_TABLET | Freq: Two times a day (BID) | ORAL | Status: DC | PRN

## 2016-01-22 MED ORDER — TRAMADOL HCL 50 MG PO TABS: 50.0000 mg | ORAL_TABLET | Freq: Two times a day (BID) | ORAL | Status: DC | PRN

## 2016-01-22 NOTE — Progress Notes (Signed)
Occupational Therapy Treatment Patient Details Name: Monique Fleming MRN: [id number] DOB: 11-07-1928 Today's Date: 01/22/2016    History of present illness Monique Fleming is an 81 y.o. female adm d/t  increased confusion and word finding difficulty; PMH of seizure disorder, anxiety, type 2 diabetes mellitus, and COPD    OT comments  Pt improved this day but agreeable to SNF as she is aware she cant care for her self  Follow Up Recommendations  SNF;Other (comment) (limited eval - depending on progress)    Equipment Recommendations  Other (comment) (TBD)    Recommendations for Other Services      Precautions / Restrictions Precautions Precautions: Fall       Mobility Bed Mobility Overal bed mobility: Needs Assistance Bed Mobility: Supine to Sit;Sit to Supine     Supine to sit: HOB elevated;Min assist Sit to supine: Min assist      Transfers Overall transfer level: Needs assistance Equipment used: 1 person hand held assist Transfers: Sit to/from [facility] Sit to Stand: Mod assist;Min assist Stand pivot transfers: Min assist       General transfer comment: assist to rise and stabilize        ADL Overall ADL's : Needs assistance/impaired Eating/Feeding: Set up;Sitting   Grooming: Set up;Sitting   Upper Body Bathing: Minimal assitance;Sitting   Lower Body Bathing: Moderate assistance;Sit to/from stand   Upper Body Dressing : Minimal assistance;Sitting   Lower Body Dressing: Maximal assistance;Sit to/from stand   Toilet Transfer: Moderate assistance;RW;BSC   Toileting- Clothing Manipulation and Hygiene: Moderate assistance;Sit to/from stand;Cueing for sequencing;Cueing for safety                Vision                            Cognition   Behavior During Therapy: [facility] for tasks assessed/performed Overall Cognitive Status: Within Functional Limits for tasks assessed                       Extremity/Trunk  Assessment                          Pertinent Vitals/ Pain       Pain Assessment: No/denies pain  Home Living                                              Frequency       Progress Toward Goals  OT Goals(current goals can now be found in the care plan section)  Progress towards OT goals: Progressing toward goals     Plan Discharge plan needs to be updated    Co-evaluation                 End of Session Equipment Utilized During Treatment: Rolling walker   Activity Tolerance Patient limited by fatigue;Other (comment);Patient tolerated treatment well (ECHO came to assess pt)   Patient Left in bed;with call bell/phone within reach;with nursing/sitter in room   Nurse Communication Mobility status        Time: 1145-1210 OT Time Calculation (min): 25 min  Charges: OT General Charges $OT Visit: 1 Procedure OT Treatments $Self Care/Home Management : 23-37 mins  Anothy Bufano, Thereasa Parkin 01/22/2016, 12:34 PM

## 2016-01-22 NOTE — Discharge Summary (Addendum)
Physician Discharge Summary  DONNAJEAN TAPP [id number] DOB: 04-25-1929 DOA: 01/18/2016  PCP: Penni Homans, MD  Admit date: 01/18/2016 Discharge date: 01/23/2016  Time spent: 35 minutes  Recommendations for Outpatient Follow-up:  Follow up with Dr. Marin Olp (office will contact patient with appointment details) Follow up with psychiatrist as an outpatient for further medication adjustment to help depression and insomnia  Repeat Magnesium level and BMET in 5 days to follow on electrolytes and renal function  Discharge Diagnoses:    Acute encephalopathy   Type 2 diabetes mellitus with vascular disease ([place])   COPD with chronic bronchitis ([place])   Diabetes mellitus type 2, uncomplicated ([place])   Speech disturbance   Prolonged Q-T interval on ECG   Iron deficiency anemia; chronic    Carotid artery stenosis   Discharge Condition: stable and improved. Discharge to SNF for physical rehabilitation. Will follow up with psychiatry and Hematology service as an outpatient   Diet recommendation: modified carbohydrates diet   Filed Weights   01/18/16 2334  Weight: 57.1 kg (125 lb 14.1 oz)    History of present illness:  As per H&P written by Dr. Myna Hidalgo on 01/18/16 82 y.o. female with PMH of seizure disorder, anxiety, type 2 diabetes mellitus, and COPD who presents with 24 hours of confusion, speech disturbance, and impaired gait. Patient lives alone but has family nearby who checks on her frequently. Patient also has a caretaker during the days. Patient had called her daughter yesterday to explain that she had not been feeling well. Her daughter noted his speech disturbance marked by slurred words and some difficulty with word-finding. Today, patient's caretaker noted the patient to be lethargic and with slurred speech. The caretaker called the patient's daughter to come evaluate her. There was concern on the patient's daughter's part that the patient may have had a stroke or TIA, or possibly a UTI  and EMS was called for transport to the hospital. history is obtained mainly from the patient's daughter, discussion with the ED personnel, and review of the medical records. There's been no fevers noted, no cough, and no apparent vomiting or diarrhea. Patient hasn't made any specific complaints, but it is noted that she "just doesn't feel right."   In ED, patient was found to be afebrile, saturating well on room air, and with vital signs stable. Head CT is negative for acute intracranial abnormality and chest x-ray demonstrates emphysematous changes but no acute findings. CMP is largely unremarkable and CBC features a hemoglobin of 11.1 and platelet count of 94,000 with both indices appearing stable relative to prior measurements. EKG features prolonged QTc interval to 548 ms and nonspecific T-wave abnormalities in the anterolateral leads. Troponin returns normal at 0.03. Urine is grossly negative for infection and cultures been requested. Patient was bolused with 1 L of normal saline and started on normal saline infusion at 125 ML per hour. Patient remained confused in the emergency department and with slowed, slurred speech. She remained hemodynamically stable however will be admitted to the hospital for ongoing evaluation and management of her acute neurologic changes.   Hospital Course:  1. Acute encephalopathy/failure to thrive -Mrs. Christophe is a pleasant 82 year old female who at baseline is highly functional, able to perform activities of daily living and still drives. -Initially there was concerns of this could represent CVA, worked up with CT scan of brain which was negative. This was followed up with an MRI of brain which did not reveal evidence of acute CVA. -Lab work has not shown  evidence of infection that could precipitate delirium. Urinalysis was negative. Two-view chest x-ray did not reveal acute cardiopulmonary disease. She has been afebrile. Labs revealed B-12 of 878 and TSH of  2.297. -Magnesium level repleted  -EEG showing fundus consistent with metabolic encephalopathy. Neurology did not recommend further inpatient neurologic workup and signed off. No seizure activity appreciated. -will follow up with psychiatrist as an outpatient for further adjustment on her medications for depression/anxiety    2. Type 2 diabetes mellitus -She has been on metformin at home. -follow with outpatient PCP for further adjustment on her hypoglycemic regimen   3. History of chronic obstructive pulmonary disease. -Stable exam, did not have evidence of respiratory compromise. -Chest x-ray also negative. continue home inhaler regimen   4. History of seizure disorder. -Continue phenytoin -no seizure appreciated during hospitalization   5. Hypomagnesemia -Initial labs revealed magnesium of 1.1on admission -acutely repleted and discharge on daily Magnesium supplementation  6. Protein calorie malnutrition: Moderate -Will continue feeding supplements  7. GERD -will continue PPI  8. Depression and anxiety -will continue PRN clonazepam and continue Pristiq  9.carotid stenosis: 40-59% stenosis  -bilaterally  -will continue plavix   10. Chronic iron deficiency anemia: -stable -will continue outpatient follow up with Dr. Marin Olp for further treatment and iron infusion as needed   11. Physical deconditioning  -evaluated by PT and OT; recommending rehabilitation at SNF   Procedures: 1. Carotid Dopplers performed on 01/19/2016 Bilateral- 40-59% internal carotid artery stenosis. Prior to previous study on 01-08-2015 States stenosis of 50%-69% maybe used different crietria.  2. EEG: no acute seizure appreciated   Consultations:  Neurology   Discharge Exam: Filed Vitals:   01/21/16 2117 01/22/16 0524  BP: 138/52 109/44  Pulse: 95 85  Temp: 98.2 F (36.8 C) 98.4 F (36.9 C)  Resp: 20 18   2. General: underweight, in No acute distress; patient is afebrile, no  complaining of CP and oriented X1. Continue to have poor insight, but overall less confused. 3. Cardiovascular: Regular rate and rhythm, normal S1-S2  4. Respiratory: Normal respiratory effort, lungs are clear  5. Abdomen: Soft, nontender, nondistended, positive BS 6. Musculoskeletal: No edema 7. Neurological: Patient having nonfocal neurologic examination  Discharge Instructions   Discharge Instructions    Discharge instructions    Complete by:  As directed   Maintain adequate hydration Follow up with Dr. Marin Olp (office will contact patient with appointment details) Follow up with psychiatrist as an outpatient for further medication adjustment to help depression and insomnia  Take medications as prescribed Repeat Magnesium level and BMET in 5 days to follow on electrolytes and renal function          Current Discharge Medication List    START taking these medications   Details  amLODipine (NORVASC) 5 MG tablet Take 1 tablet (5 mg total) by mouth daily.    feeding supplement, ENSURE ENLIVE, (ENSURE ENLIVE) LIQD Take 237 mLs by mouth 3 (three) times daily between meals.    magnesium oxide (MAG-OX) 400 (241.3 Mg) MG tablet Take 1 tablet (400 mg total) by mouth daily.      CONTINUE these medications which have CHANGED   Details  clonazePAM (KLONOPIN) 1 MG tablet Take 1 tablet (1 mg total) by mouth 2 (two) times daily as needed for anxiety. Qty: 15 tablet, Refills: 0    traMADol (ULTRAM) 50 MG tablet Take 1 tablet (50 mg total) by mouth every 12 (twelve) hours as needed for severe pain. Qty: 15 tablet, Refills:  0      CONTINUE these medications which have NOT CHANGED   Details  albuterol (PROAIR HFA) 108 (90 BASE) MCG/ACT inhaler Inhale 2 puffs into the lungs every 4 (four) hours as needed for wheezing or shortness of breath. Qty: 3 Inhaler, Refills: 2    cholestyramine (QUESTRAN) 4 g packet MIX 1 PACKET (4 GRAM TOTAL) IN LIQUID AND DRINK DAILY AS NEEDED FOR  DIARRHEA Qty: 180 each, Refills: 0    clopidogrel (PLAVIX) 75 MG tablet TAKE 1 TABLET DAILY Qty: 90 tablet, Refills: 2    desvenlafaxine (PRISTIQ) 100 MG 24 hr tablet Take 100 mg by mouth daily.   Associated Diagnoses: Iron deficiency anemia    ergocalciferol (VITAMIN D2) 50000 UNITS capsule Take 1 capsule (50,000 Units total) by mouth once a week. Qty: 4 capsule, Refills: 4    fluticasone (FLONASE) 50 MCG/ACT nasal spray USE 2 SPRAYS IN EACH NOSTRIL DAILY AS NEEDED FOR ALLERGIES Qty: 48 g, Refills: 2    formoterol (PERFOROMIST) 20 MCG/2ML nebulizer solution Take 2 mLs (20 mcg total) by nebulization 2 (two) times daily. Qty: 120 mL, Refills: 5    hydrocortisone (ANUSOL-HC) 25 MG suppository Use 1 suppository per rectum as needed. Qty: 20 suppository, Refills: 1    KLOR-CON M10 10 MEQ tablet TAKE 1 TABLET TWICE A DAY Qty: 180 tablet, Refills: 1    metFORMIN (GLUCOPHAGE) 500 MG tablet TAKE ONE-HALF (1/2) TO ONE TABLET DAILY WITH BREAKFAST Qty: 90 tablet, Refills: 0    montelukast (SINGULAIR) 10 MG tablet Take 10 mg by mouth at bedtime.    Multiple Vitamins-Minerals (OCUVITE PRESERVISION) TABS Take by mouth 2 (two) times daily.    ondansetron (ZOFRAN) 4 MG tablet Take 1 tablet (4 mg total) by mouth every 6 (six) hours as needed for nausea or vomiting. Qty: 60 tablet, Refills: 0    pantoprazole (PROTONIX) 40 MG tablet Take 1 tab by mouth every morning. Qty: 90 tablet, Refills: 3    phenytoin (DILANTIN) 100 MG ER capsule TAKE TWO CAPSULES BY MOUTH AT BEDTIME DAILY Qty: 60 capsule, Refills: 3    Polyethyl Glycol-Propyl Glycol (SYSTANE) 0.4-0.3 % SOLN Place 1 drop into both eyes 2 (two) times daily as needed (dry eyes).     saccharomyces boulardii (FLORASTOR) 250 MG capsule Take 250 mg by mouth daily.    sodium chloride (OCEAN) 0.65 % SOLN nasal spray Place 2 sprays into both nostrils daily as needed for congestion.       STOP taking these medications      HYDROcodone-acetaminophen (NORCO) 5-325 MG tablet        Allergies  Allergen Reactions  . Bactrim [Sulfamethoxazole-Trimethoprim] Shortness Of Breath  . Levofloxacin Other (See Comments)    seizure  . Pneumovax [Pneumococcal Polysaccharide Vaccine] Other (See Comments)    Severe allergy-cellulitis  . Amoxicillin-Pot Clavulanate Other (See Comments)    Patient stated Drs. recommend taking Augmentin due to her PCN allergy.  . Penicillins Rash    Has patient had a PCN reaction causing immediate rash, facial/tongue/throat swelling, SOB or lightheadedness with hypotension: No Has patient had a PCN reaction causing severe rash involving mucus membranes or skin necrosis: No Has patient had a PCN reaction that required hospitalization No Has patient had a PCN reaction occurring within the last 10 years: No If all of the above answers are "NO", then may proceed with Cephalosporin use.   . Bupropion   . Haldol [Haloperidol Lactate]     Had unknown reaction many yrs ago per  pt  . Haloperidol   . Morphine   . Morphine And Related     Confused and agitation  . Pneumococcal Vaccine   . Pseudoephedrine Hcl   . Sudafed [Pseudoephedrine Hcl] Other (See Comments)    seizure   Follow-up Information    Follow up with Penni Homans, MD.   Specialty:  Family Medicine   Why:  in approx 10 days after discharge from SNF   Contact information:   [address] [place] [address] [phone]       Follow up with Volanda Napoleon, MD.   Specialty:  Oncology   Why:  office will contact patient with appointment details    Contact information:   [address] [place] [address] [phone]       The results of significant diagnostics from this hospitalization (including imaging, microbiology, ancillary and laboratory) are listed below for reference.    Significant Diagnostic Studies: Dg Chest 2 View  01/18/2016  CLINICAL DATA:  Generalized weakness and  cough EXAM: CHEST  2 VIEW COMPARISON:  11/16/2015 chest radiograph. FINDINGS: Stable cardiomediastinal silhouette with normal heart size. No pneumothorax. No pleural effusion. Emphysema. No pulmonary edema. No acute consolidative airspace disease. IMPRESSION: Emphysema.  Otherwise no active disease in the chest. Electronically Signed   By: Ilona Sorrel M.D.   On: 01/18/2016 17:29   Ct Head Wo Contrast  01/18/2016  CLINICAL DATA:  Progressive weakness and altered mental status. EXAM: CT HEAD WITHOUT CONTRAST TECHNIQUE: Contiguous axial images were obtained from the base of the skull through the vertex without intravenous contrast. COMPARISON:  05/25/2015 FINDINGS: There is no intracranial hemorrhage, mass or evidence of acute infarction. There is no extra-axial fluid collection. There is moderate generalized atrophy. There is white matter hypodensity consistent with small vessel ischemic disease. Chronic lacunar infarct in the right cerebellar hemisphere. Probable additional chronic lacunar infarct in the right putamen. No acute findings are evident. No interval change is evident. There is no bony abnormality. Visible paranasal sinuses are clear. Visible orbits are unremarkable. IMPRESSION: No acute intracranial findings. There is moderate generalized atrophy and small vessel ischemic changes. Electronically Signed   By: Andreas Newport M.D.   On: 01/18/2016 21:34   Mr Brain Wo Contrast  01/19/2016  CLINICAL DATA:  Lethargy with slurred speech. Impaired gait. Symptoms for 24 hours. Seizure disorder. Diabetes. Hypertension. Anxiety, COPD. Cirrhosis. EXAM: MRI HEAD WITHOUT CONTRAST MRA HEAD WITHOUT CONTRAST TECHNIQUE: Multiplanar, multiecho pulse sequences of the brain and surrounding structures were obtained without intravenous contrast. Angiographic images of the head were obtained using MRA technique without contrast. COMPARISON:  CT head 01/18/2016. FINDINGS: MRI HEAD FINDINGS The patient was unable to  remain motionless for the exam. Small or subtle lesions could be overlooked. No evidence for acute infarction, hemorrhage, mass lesion, hydrocephalus, or extra-axial fluid. Global atrophy. Extensive white matter disease. Chronic LEFT occipital infarct. Chronic RIGHT cerebellar infarct. Flow voids are maintained. Widespread areas of chronic hemorrhage throughout the cerebral hemispheres, cerebellum, and deep nuclei. No appreciable T1 shortening of any of the lesions. Findings most consistent with chronic microbleeds from longstanding hypertensive cerebrovascular disease. Cerebral amyloid angiopathy, chronic coagulopathy, or multiple cavernomas are less favored. No midline abnormality. Extracranial soft tissues unremarkable. BILATERAL cataract extraction. MRA HEAD FINDINGS Motion degraded exam. Internal carotid arteries widely patent. Basilar artery widely patent with vertebrals codominant. No intracranial stenosis or aneurysm. IMPRESSION: No acute stroke is evident.  Atrophy with small vessel disease. Widespread areas  of chronic hemorrhage throughout the brain, favored to represent sequelae of longstanding hypertensive cerebrovascular disease. No proximal large vessel stenosis or occlusion. Electronically Signed   By: Staci Righter M.D.   On: 01/19/2016 13:03   Mr Jodene Nam Head/brain Wo Cm  01/19/2016  CLINICAL DATA:  Lethargy with slurred speech. Impaired gait. Symptoms for 24 hours. Seizure disorder. Diabetes. Hypertension. Anxiety, COPD. Cirrhosis. EXAM: MRI HEAD WITHOUT CONTRAST MRA HEAD WITHOUT CONTRAST TECHNIQUE: Multiplanar, multiecho pulse sequences of the brain and surrounding structures were obtained without intravenous contrast. Angiographic images of the head were obtained using MRA technique without contrast. COMPARISON:  CT head 01/18/2016. FINDINGS: MRI HEAD FINDINGS The patient was unable to remain motionless for the exam. Small or subtle lesions could be overlooked. No evidence for acute infarction,  hemorrhage, mass lesion, hydrocephalus, or extra-axial fluid. Global atrophy. Extensive white matter disease. Chronic LEFT occipital infarct. Chronic RIGHT cerebellar infarct. Flow voids are maintained. Widespread areas of chronic hemorrhage throughout the cerebral hemispheres, cerebellum, and deep nuclei. No appreciable T1 shortening of any of the lesions. Findings most consistent with chronic microbleeds from longstanding hypertensive cerebrovascular disease. Cerebral amyloid angiopathy, chronic coagulopathy, or multiple cavernomas are less favored. No midline abnormality. Extracranial soft tissues unremarkable. BILATERAL cataract extraction. MRA HEAD FINDINGS Motion degraded exam. Internal carotid arteries widely patent. Basilar artery widely patent with vertebrals codominant. No intracranial stenosis or aneurysm. IMPRESSION: No acute stroke is evident.  Atrophy with small vessel disease. Widespread areas of chronic hemorrhage throughout the brain, favored to represent sequelae of longstanding hypertensive cerebrovascular disease. No proximal large vessel stenosis or occlusion. Electronically Signed   By: Staci Righter M.D.   On: 01/19/2016 13:03    Microbiology: Recent Results (from the past 240 hour(s))  Urine culture     Status: None   Collection Time: 01/18/16  5:55 PM  Result Value Ref Range Status   Specimen Description URINE, CLEAN CATCH  Final   Special Requests NONE  Final   Culture   Final    NO GROWTH 1 DAY Performed at [facility]    Report Status 01/20/2016 FINAL  Final     Labs: Basic Metabolic Panel:  Recent Labs Lab 01/18/16 1655 01/19/16 0524 01/20/16 1059 01/21/16 0445 01/22/16 0444  NA 140  --  141  --  145  K 4.4  --  4.1  --  3.6  CL 104  --  111  --  116*  CO2 27  --  25  --  23  GLUCOSE 165*  --  90  --  188*  BUN 11  --  11  --  8  CREATININE 0.81  --  0.94  --  0.77  CALCIUM 8.6*  --  8.0*  --  8.2*  MG  --  1.1* 1.4* 1.5* 1.7   Liver Function  Tests:  Recent Labs Lab 01/18/16 1655 01/20/16 1059 01/22/16 0444  AST 54* 46* 36  ALT 18 17 14   ALKPHOS 92 92 76  BILITOT 0.9 0.9 0.6  PROT 6.5 5.9* 5.7*  ALBUMIN 3.2* 3.0* 2.7*   CBC:  Recent Labs Lab 01/18/16 1655 01/19/16 0524 01/20/16 1059 01/21/16 0445 01/22/16 0444  WBC 4.9  --  7.6 7.3 6.6  NEUTROABS 3.8  --   --   --   --   HGB 11.1*  --  10.9* 10.3* 10.2*  HCT 34.0* 31.8* 33.0* 31.3* 31.1*  MCV 87.2  --  87.8 87.4 88.6  PLT 94*  --  124* 114* 114*   Cardiac Enzymes:  Recent Labs Lab 01/18/16 1655  TROPONINI 0.03   BNP: BNP (last 3 results)  Recent Labs  05/25/15 1845  BNP 630.3*    ProBNP (last 3 results)  Recent Labs  02/07/15 1355  PROBNP 564.0*    CBG:  Recent Labs Lab 01/21/16 1207 01/21/16 1724 01/21/16 2118 01/22/16 0739 01/22/16 1208  GLUCAP 295* 139* 174* 97 118*    Signed:  Barton Dubois MD.  [facility] 01/22/2016, 2:42 PM

## 2016-01-22 NOTE — Evaluation (Signed)
Speech Language Pathology Evaluation Patient Details Name: Monique Fleming MRN: [id number] DOB: 1929-03-22 Today's Date: 01/22/2016 Time: [id number] SLP Time Calculation (min) (ACUTE ONLY): 23 min  Problem List:  Patient Active Problem List   Diagnosis Date Noted  . Abnormal EEG   . Normocytic anemia   . Acute encephalopathy 01/18/2016  . Speech disturbance 01/18/2016  . Prolonged Q-T interval on ECG 01/18/2016  . Right shoulder pain 11/07/2015  . Elevated BP 07/09/2015  . Cold sore 04/25/2015  . Chronic respiratory failure with hypoxia ([place]) 04/25/2015  . Rib pain on left side 02/21/2015  . Popliteal pain 02/07/2015  . Pedal edema 02/07/2015  . Pain in joint, ankle and foot 02/07/2015  . Wheezing 12/21/2014  . Diabetes mellitus type 2, uncomplicated ([place]) 123XX123  . Diabetes mellitus, type 2 ([place]) 12/21/2014  . Chest pain 08/24/2014  . Chest pain, mid sternal   . General weakness 08/22/2014  . Dehydration 08/22/2014  . Acute diarrhea 08/22/2014  . Hemorrhoids 08/22/2014  . Rectal bleeding 07/04/2014  . Nonspecific (abnormal) findings on radiological and other examination of gastrointestinal tract 07/04/2014  . Iron deficiency anemia  06/27/2014  . Mild cognitive disorder 04/03/2014  . COPD (chronic obstructive pulmonary disease) with chronic bronchitis Gold B 03/14/2014  . Depression, major, recurrent ([place]) 03/06/2014  . Macular degeneration 02/18/2014  . Anxiety state 01/08/2014  . Carotid disease, bilateral ([place]) 01/02/2014  . Burning with urination 12/17/2013  . Aortic stenosis 12/17/2013  . Wounds, multiple 06/28/2013  . Thrombocytopenia ([place]) 04/01/2013  . Cerebral artery occlusion ([place]) 03/11/2013  . Cardiac disease 03/11/2013  . Nasal septal perforation 07/20/2012  . Hyperlipidemia 10/20/2011  . Reaction to Pneumovax immunization 03/07/2011  . ACTINIC KERATOSIS 08/22/2010  . NEOPLASM OF UNCERTAIN BEHAVIOR OF SKIN 05/28/2010  . ANGIODYSPLASIA-INTESTINE  02/25/2010  . ANEMIA-B12 DEFICIENCY 12/30/2009  . Megaloblastic anemia due to B12 deficiency 12/30/2009  . Iron deficiency anemia 12/23/2009  . BREAST PAIN 10/09/2009  . CIRRHOSIS 03/08/2009  . DIVERTICULOSIS-COLON 02/28/2009  . History of malignant neoplasm of large intestine 02/28/2009  . PERSONAL HX COLONIC POLYPS 02/28/2009  . UNSPECIFIED BREAST DISORDER 02/13/2009  . SEIZURE DISORDER 12/25/2008  . GRAND MAL SEIZURE 10/08/2008  . NECK PAIN 09/10/2008  . COPD with chronic bronchitis ([place]) 05/29/2008  . Malaise and fatigue 03/26/2008  . GERD 03/07/2008  . Irritable bowel syndrome 12/07/2007  . TOBACCO ABUSE, Hx of 10/05/2007  . INSOMNIA, CHRONIC 10/05/2007  . ATHEROSCLEROSIS 10/05/2007  . SLEEP RELATED MOVEMENT DISORDER UNSPECIFIED 10/05/2007  . Type 2 diabetes mellitus with vascular disease ([place]) 03/02/2007  . ANXIETY 03/02/2007  . PERIPHERAL VASCULAR DISEASE 03/02/2007  . RENAL CYST, LEFT 03/02/2007  . TRANSIENT ISCHEMIC ATTACK, HX OF 03/02/2007   Past Medical History:  Past Medical History  Diagnosis Date  . Aortic stenosis     moderate by echo 6/11 gradient 20/64mmHg for mean and peak  . TIA (transient ischemic attack)     Dr. [name]  . Peripheral vascular disease ([place])   . IBS (irritable bowel syndrome)   . Diabetes mellitus     type II  . Chronic bronchitis   . COPD (chronic obstructive pulmonary disease) ([place])   . Atherosclerosis   . Depression   . Anxiety     Dr Casimiro Needle  . Renal cyst   . Anemia     iron deficiency  . Cellulitis   . Diverticular disease   . Infectious diarrhea(009.2)   . Sinusitis   . Thrush   . Abnormal  liver function test   . GERD (gastroesophageal reflux disease)   . Insomnia     chronic  . Adenocarcinoma ([place]) 2008    colon,s/p right hemicolectomy  . Angina   . Seizures (HCC)     hx of one seizure "  . Cirrhosis of liver (HCC)     hx of  . Clostridium difficile diarrhea   . Thrombocytopenia ([place]) 04/01/2013  . Bursitis      spine per pt  . Macular degeneration 02/18/2014    Follows with [facility]  . Internal hemorrhoids   . Asthma   . Elevated BP 07/09/2015  . Type 2 diabetes mellitus with vascular disease ([place]) 03/02/2007    Chronic     Past Surgical History:  Past Surgical History  Procedure Laterality Date  . Carotid endarterectomy    . Colectomy      right hemi  . Cholecystectomy  11/09  . Upper gastrointestinal endoscopy    . Tonsillectomy    . Appendectomy     HPI:  82 yo female adm to [facility] with AMS, confusion.  Pt imaging study negative for acute change.  EEG showed slowing.  Speech evaluation ordered.  Prior to admit per chart notes, pt was fully independent prior to admission.     Assessment / Plan / Recommendation Clinical Impression  Pt presents with mild expressive language difficulties characterized by word finding deficits.  Pauses/dysfluencies in expressive language noted with intermittent pt awareness.  Pt responsive to phonemic and phrase completion cues to faciliate communication.  She was oriented x4 (but not actual date) and demonstrated basic problem solving skills.  Further skilled cognitive linguistic evaluation/treatment indicated as pt was independent prior to admit per chart review.  She does appear much improved compared to earlier in admission.  Pt agreeable to follow up SLP to maximize communication - goals and compenstaion reviewed with pt.      SLP Assessment  Patient needs continued Speech Lanaguage Pathology Services    Follow Up Recommendations  Skilled Nursing facility    Frequency and Duration min 1 x/week  1 week      SLP Evaluation Prior Functioning  Cognitive/Linguistic Baseline: Information not available (per chart review, pt was fully independent)   Cognition  Arousal/Alertness: Awake/alert Orientation Level: Oriented to person;Oriented to place;Disoriented to time (only to date but oriented to month, year) Attention: Sustained Sustained Attention:  Appears intact Memory:  (for basic, need to use call bell, did not recall medical event) Awareness: Impaired Awareness Impairment: Intellectual impairment (? decreased awareness to word finding deficits) Problem Solving: Appears intact (for basic functions)    Comprehension  Auditory Comprehension Overall Auditory Comprehension: Appears within functional limits for tasks assessed Yes/No Questions: Not tested Commands: Within Functional Limits Conversation: Complex Interfering Components: Hearing;Visual impairments EffectiveTechniques: Increased volume;Repetition Visual Recognition/Discrimination Discrimination: Not tested (due to pt's vision impairment) Reading Comprehension Reading Status: Not tested (due to pt's vision impairment)    Expression Expression Primary Mode of Expression: Verbal Verbal Expression Overall Verbal Expression: Impaired Initiation: No impairment Level of Generative/Spontaneous Verbalization: Conversation Repetition: No impairment Naming: No impairment Pragmatics: No impairment Other Verbal Expression Comments: word finding deficits result in mild dysfluency/pauses  Written Expression Dominant Hand: Right Written Expression: Not tested   Oral / Motor  Oral Motor/Sensory Function Overall Oral Motor/Sensory Function: Within functional limits ("whitish coating on superior lingua - ? consistent with oral candidiasis) Motor Speech Overall Motor Speech: Appears within functional limits for tasks assessed Respiration: Within functional limits Resonance:  Within functional limits Articulation: Within functional limitis Motor Planning: Witnin functional limits   [place], [facility] SLP [phone]

## 2016-01-22 NOTE — Progress Notes (Signed)
Physical Therapy Treatment Patient Details Name: Monique Fleming MRN: [id number] DOB: 1929-04-24 Today's Date: 01/22/2016    History of Present Illness Monique Fleming is an 82 y.o. female adm d/t  increased confusion and word finding difficulty; PMH of seizure disorder, anxiety, type 2 diabetes mellitus, and COPD. CT head negative for CVA    PT Comments    Pt tolerated increased activity level, she ambulated 38' with RW and min/guard assist. Frequent verbal cues for safety and positioning in walker.   Follow Up Recommendations  SNF     Equipment Recommendations  Other (comment) (TBD)    Recommendations for Other Services       Precautions / Restrictions Precautions Precautions: Fall    Mobility  Bed Mobility Overal bed mobility: Modified Independent Bed Mobility: Supine to Sit;Sit to Supine     Supine to sit: HOB elevated;Min assist Sit to supine: Min assist   General bed mobility comments: used rail  Transfers Overall transfer level: Needs assistance Equipment used: Rolling walker (2 wheeled) Transfers: Sit to/from Stand Sit to Stand: Min assist Stand pivot transfers: Min assist       General transfer comment: verbal cues for hand placment, min A to rise  Ambulation/Gait Ambulation/Gait assistance: Min guard Ambulation Distance (Feet): 26 Feet Assistive device: Rolling walker (2 wheeled) Gait Pattern/deviations: Step-through pattern;Decreased step length - right;Decreased step length - left;Trunk flexed   Gait velocity interpretation: at or above normal speed for age/gender General Gait Details: Frequent verbal cues to step into frame of RW and increase step length, distance limited by fatigue and 2/4 dyspnea   Stairs            Wheelchair Mobility    Modified Rankin (Stroke Patients Only)       Balance     Sitting balance-Leahy Scale: Good       Standing balance-Leahy Scale: Fair                 High Level Balance Comments:  min/guard assist for balance during standing pericare and hand washing in bathroom    Cognition Arousal/Alertness: Awake/alert Behavior During Therapy: WFL for tasks assessed/performed Overall Cognitive Status: Within Functional Limits for tasks assessed                      Exercises      General Comments        Pertinent Vitals/Pain Pain Assessment: No/denies pain    Home Living                      Prior Function            PT Goals (current goals can now be found in the care plan section) Acute Rehab PT Goals Patient Stated Goal: did not state PT Goal Formulation: With patient Time For Goal Achievement: 01/27/16 Potential to Achieve Goals: Good Progress towards PT goals: Progressing toward goals    Frequency  Min 3X/week    PT Plan Current plan remains appropriate    Co-evaluation             End of Session   Activity Tolerance: Patient tolerated treatment well;No increased pain Patient left: with call bell/phone within reach;in chair;with chair alarm set     Time: [id number] PT Time Calculation (min) (ACUTE ONLY): 17 min  Charges:  $Gait Training: 8-22 mins  G Codes:      Blondell Reveal Kistler 01/22/2016, 1:27 PM [phone]

## 2016-01-22 NOTE — Clinical Social Work Placement (Signed)
Awaiting pasarr # - patient's daughter, Monique Fleming made aware. Text page sent to Dr. Dyann Kief as well.   Patient is set to discharge to [facility] today. Patient & daughter, Monique Fleming aware.     Raynaldo Opitz, [facility] Clinical Social Worker cell #: [phone]    CLINICAL SOCIAL WORK PLACEMENT  NOTE  Date:  01/22/2016  Patient Details  Name: Monique Fleming MRN: [id number] Date of Birth: 1929/09/26  Clinical Social Work is seeking post-discharge placement for this patient at the [place] level of care (*CSW will initial, date and re-position this form in  chart as items are completed):  Yes   Patient/family provided with [place] Work Department's list of facilities offering this level of care within the geographic area requested by the patient (or if unable, by the patient's family).  Yes   Patient/family informed of their freedom to choose among providers that offer the needed level of care, that participate in Medicare, Medicaid or managed care program needed by the patient, have an available bed and are willing to accept the patient.  Yes   Patient/family informed of 's ownership interest in [facility] and [facility], as well as of the fact that they are under no obligation to receive care at these facilities.  PASRR submitted to EDS on 01/21/16     PASRR number received on       Existing PASRR number confirmed on       FL2 transmitted to all facilities in geographic area requested by pt/family on 01/21/16     FL2 transmitted to all facilities within larger geographic area on       Patient informed that his/her managed care company has contracts with or will negotiate with certain facilities, including the following:        Yes   Patient/family informed of bed offers received.  Patient chooses bed at [facility]     Physician recommends and patient chooses bed at      Patient to be  transferred to [facility] on  .  Patient to be transferred to facility by [facility]AR     Patient family notified on 01/22/16 of transfer.  Name of family member notified:  patient's daughter, Monique Fleming via phone     PHYSICIAN       Additional Comment:    _______________________________________________ Standley Brooking, LCSW 01/22/2016, 4:04 PM

## 2016-01-23 LAB — OCCULT BLOOD X 1 CARD TO LAB, STOOL: Fecal Occult Bld: POSITIVE — AB

## 2016-01-23 LAB — GLUCOSE, CAPILLARY
GLUCOSE-CAPILLARY: 123 mg/dL — AB (ref 65–99)
GLUCOSE-CAPILLARY: 124 mg/dL — AB (ref 65–99)
GLUCOSE-CAPILLARY: 117 mg/dL — AB (ref 65–99)

## 2016-01-23 NOTE — Clinical Social Work Placement (Signed)
Patient is set to discharge to [facility] today. Patient & daughter, [name] made aware. Discharge packet given to RN, Marolyn Hammock. Daughter, [name] to transport to SNF, plans to pick her up between 1-2:00pm.     Raynaldo Opitz, [place] Social Worker cell #: [phone]    CLINICAL SOCIAL WORK PLACEMENT  NOTE  Date:  01/23/2016  Patient Details  Name: Monique Fleming MRN: [id number] Date of Birth: 1929/02/05  Clinical Social Work is seeking post-discharge placement for this patient at the [place] level of care (*CSW will initial, date and re-position this form in  chart as items are completed):  Yes   Patient/family provided with [place] Work Department's list of facilities offering this level of care within the geographic area requested by the patient (or if unable, by the patient's family).  Yes   Patient/family informed of their freedom to choose among providers that offer the needed level of care, that participate in Medicare, Medicaid or managed care program needed by the patient, have an available bed and are willing to accept the patient.  Yes   Patient/family informed of [facility]'s ownership interest in [facility] and [facility], as well as of the fact that they are under no obligation to receive care at these facilities.  PASRR submitted to EDS on 01/21/16     PASRR number received on       Existing PASRR number confirmed on       FL2 transmitted to all facilities in geographic area requested by pt/family on 01/21/16     FL2 transmitted to all facilities within larger geographic area on       Patient informed that his/her managed care company has contracts with or will negotiate with certain facilities, including the following:        Yes   Patient/family informed of bed offers received.  Patient chooses bed at [facility]     Physician recommends and patient chooses bed at       Patient to be transferred to [facility] on 01/23/16.  Patient to be transferred to facility by patient's daughter, [name]      Patient family notified on 01/23/16 of transfer.  Name of family member notified:  patient's daughter, [name] via phone     PHYSICIAN       Additional Comment:    _______________________________________________ Standley Brooking, LCSW 01/23/2016, 10:52 AM

## 2016-01-23 NOTE — Progress Notes (Signed)
Patient seen and examined. Vital signs stable and without acute complaints. More alert and oriented today. Medication reconciliation and discharge summary reviewed and appropriate. Patient is ok and stable for discharge. Initial discharge anticipated on 4/19, failed due to lack of PSARR number.   Barton Dubois [id number]

## 2016-01-23 NOTE — Progress Notes (Signed)
Report called to nurse Thresa Ross at [facility]. All questions answered and patient D/C'd with daughter. Patient assisted to daughter's vehicle by NT via W/C. All belongings and D/C paperwork sent with daughter. Patient A&O at D/C, VSS.

## 2016-01-23 NOTE — Care Management Note (Signed)
Case Management Note  Patient Details  Name: Monique Fleming MRN: [id number] Date of Birth: 1929-02-06  Subjective/Objective:                    Action/Plan:d/c SNF.   Expected Discharge Date:                  Expected Discharge Plan:  [place]  In-House Referral:     Discharge planning Services  CM Consult  Post Acute Care Choice:    Choice offered to:     DME Arranged:    DME Agency:     HH Arranged:    [place] Agency:     Status of Service:  Completed, signed off  Medicare Important Message Given:    Date Medicare IM Given:    Medicare IM give by:    Date Additional Medicare IM Given:    Additional Medicare Important Message give by:     If discussed at [place] of Stay Meetings, dates discussed:    Additional Comments:  Dessa Phi, RN 01/23/2016, 10:43 AM

## 2016-01-23 NOTE — Consult Note (Signed)
   [facility] CM Inpatient Consult   01/23/2016  Monique Fleming Nov 20, 1928 [id number]   Patient screened for potential [facility] Care Management services. Chart reviewed. Noted discharge plan is for SNF.  There are no identifiable [facility] Care Management needs at this time. If patient's post hospital needs change, please place a [facility] Care Management consult. For questions please contact:  Marthenia Rolling, [place], RN,BSN [facility] Liaison [phone]

## 2016-01-23 NOTE — Progress Notes (Signed)
MD made aware of patient's severe diarrhea episodes x3. Per MD, give patient her scheduled Questran. Patient refused AM dose but agreed to take dose after diarrhea episode. Will continue to monitor and proceed with D/C.

## 2016-01-23 NOTE — Care Management Important Message (Signed)
Important Message  Patient Details  Name: Monique Fleming MRN: [id number] Date of Birth: 07-10-1929   Medicare Important Message Given:  Yes    McGibboneyOletta Darter, RN 01/23/2016, 12:11 PM

## 2016-01-24 ENCOUNTER — Encounter: Payer: Self-pay | Admitting: Internal Medicine

## 2016-01-24 ENCOUNTER — Non-Acute Institutional Stay (SKILLED_NURSING_FACILITY): Payer: Medicare Other | Admitting: Internal Medicine

## 2016-01-24 DIAGNOSIS — R569 Unspecified convulsions: Secondary | ICD-10-CM

## 2016-01-24 DIAGNOSIS — F419 Anxiety disorder, unspecified: Secondary | ICD-10-CM

## 2016-01-24 DIAGNOSIS — G9341 Metabolic encephalopathy: Secondary | ICD-10-CM

## 2016-01-24 DIAGNOSIS — D638 Anemia in other chronic diseases classified elsewhere: Secondary | ICD-10-CM

## 2016-01-24 DIAGNOSIS — E1159 Type 2 diabetes mellitus with other circulatory complications: Secondary | ICD-10-CM

## 2016-01-24 DIAGNOSIS — R531 Weakness: Secondary | ICD-10-CM

## 2016-01-24 DIAGNOSIS — J449 Chronic obstructive pulmonary disease, unspecified: Secondary | ICD-10-CM

## 2016-01-24 DIAGNOSIS — I739 Peripheral vascular disease, unspecified: Secondary | ICD-10-CM

## 2016-01-24 DIAGNOSIS — K219 Gastro-esophageal reflux disease without esophagitis: Secondary | ICD-10-CM

## 2016-01-24 DIAGNOSIS — F329 Major depressive disorder, single episode, unspecified: Secondary | ICD-10-CM

## 2016-01-24 DIAGNOSIS — D696 Thrombocytopenia, unspecified: Secondary | ICD-10-CM | POA: Diagnosis not present

## 2016-01-24 DIAGNOSIS — F32A Depression, unspecified: Secondary | ICD-10-CM

## 2016-01-24 DIAGNOSIS — E46 Unspecified protein-calorie malnutrition: Secondary | ICD-10-CM | POA: Diagnosis not present

## 2016-01-24 DIAGNOSIS — J4489 Other specified chronic obstructive pulmonary disease: Secondary | ICD-10-CM

## 2016-01-24 NOTE — Progress Notes (Signed)
LOCATION: [facility]  PCP: Penni Homans, MD   Code Status: DNR  Goals of care: Advanced Directive information Advanced Directives 01/18/2016  Does patient have an advance directive? No  Would patient like information on creating an advanced directive? No - patient declined information       Extended Emergency Contact Information Primary Emergency Contact: Lyall,Betsy Address: [id number], [place] Phone: [phone] Mobile Phone: [phone] Relation: Daughter Secondary Emergency Contact: Adair Laundry, [place] Phone: [phone] Mobile Phone: [phone] Relation: Daughter   Allergies  Allergen Reactions  . Bactrim [Sulfamethoxazole-Trimethoprim] Shortness Of Breath  . Levofloxacin Other (See Comments)    seizure  . Pneumovax [Pneumococcal Polysaccharide Vaccine] Other (See Comments)    Severe allergy-cellulitis  . Amoxicillin-Pot Clavulanate Other (See Comments)    Patient stated Drs. recommend taking Augmentin due to her PCN allergy.  . Penicillins Rash    Has patient had a PCN reaction causing immediate rash, facial/tongue/throat swelling, SOB or lightheadedness with hypotension: No Has patient had a PCN reaction causing severe rash involving mucus membranes or skin necrosis: No Has patient had a PCN reaction that required hospitalization No Has patient had a PCN reaction occurring within the last 10 years: No If all of the above answers are "NO", then may proceed with Cephalosporin use.   . Bupropion   . Haldol [Haloperidol Lactate]     Had unknown reaction many yrs ago per pt  . Haloperidol   . Morphine   . Morphine And Related     Confused and agitation  . Pneumococcal Vaccine   . Pseudoephedrine Hcl   . Sudafed [Pseudoephedrine Hcl] Other (See Comments)    seizure    Chief Complaint  Patient presents with  . New Admit To SNF    New Admission       HPI:  Patient is a 82 y.o. female seen today for short term rehabilitation post hospital admission from 01/18/16-01/23/16 with acute encephalopathy and failure to thrive. Acute CVA was ruled out with ct head and MRI brain. Infectious etiology was ruled out. EEG findings were consistent with metabolic encephalopathy. She was seen by neurology. She has PMH of seizure disorder, anxiety, type 2 diabetes mellitus, COPD among others. She is seen in her room today.    Review of Systems:  Constitutional: Negative for fever, diaphoresis. Feels weak and tired.  HENT: Negative for headache, congestion, nasal discharge, sore throat, difficulty swallowing.  she is hard of hearing. Respiratory: positive for cough. Negative for shortness of breath and wheezing. On 2 liters of oxygen.   Cardiovascular: Negative for chest pain, palpitations, leg swelling.  Gastrointestinal: Negative for heartburn, nausea, vomiting, abdominal pain. Last bowel movement was today. Genitourinary: Negative for dysuria and flank pain.  Musculoskeletal: Negative for back pain, fall in the facility.  Skin: Negative for itching, rash.  Neurological: Negative for dizziness. Psychiatric/Behavioral: Negative for depression   Past Medical History  Diagnosis Date  . Aortic stenosis     moderate by echo 6/11 gradient 20/78mmHg for mean and peak  . TIA (transient ischemic attack)     Dr. [name]  . Peripheral vascular disease ([place])   . IBS (irritable bowel syndrome)   . Diabetes mellitus     type II  . Chronic bronchitis   . COPD (chronic obstructive pulmonary disease) ([place])   .  Atherosclerosis   . Depression   . Anxiety     Dr Casimiro Needle  . Renal cyst   . Anemia     iron deficiency  . Cellulitis   . Diverticular disease   . Infectious diarrhea(009.2)   . Sinusitis   . Thrush   . Abnormal liver function test   . GERD (gastroesophageal reflux disease)   . Insomnia     chronic  . Adenocarcinoma ([facility]) 2008    colon,s/p right  hemicolectomy  . Angina   . Seizures ([facility])     hx of one seizure "  . Cirrhosis of liver ([facility])     hx of  . Clostridium difficile diarrhea   . Thrombocytopenia ([facility]) 04/01/2013  . Bursitis     spine per pt  . Macular degeneration 02/18/2014    Follows with [facility]  . Internal hemorrhoids   . Asthma   . Elevated BP 07/09/2015  . Type 2 diabetes mellitus with vascular disease ([place]) 03/02/2007    Chronic     Past Surgical History  Procedure Laterality Date  . Carotid endarterectomy    . Colectomy      right hemi  . Cholecystectomy  11/09  . Upper gastrointestinal endoscopy    . Tonsillectomy    . Appendectomy     Social History:   reports that she has been smoking Cigarettes.  She started smoking about 64 years ago. She has a 35 pack-year smoking history. She has never used smokeless tobacco. She reports that she does not drink alcohol or use illicit drugs.  Family History  Problem Relation Age of Onset  . Colon cancer Paternal Grandmother   . Esophageal cancer Brother   . Heart disease Mother   . Diabetes Brother   . Hypertension Father   . Rectal cancer Neg Hx   . Stomach cancer Neg Hx     Medications:   Medication List       This list is accurate as of: 01/24/16  3:50 PM.  Always use your most recent med list.               albuterol 108 (90 Base) MCG/ACT inhaler  Commonly known as:  PROAIR HFA  Inhale 2 puffs into the lungs every 4 (four) hours as needed for wheezing or shortness of breath.     amLODipine 5 MG tablet  Commonly known as:  NORVASC  Take 1 tablet (5 mg total) by mouth daily.     cholestyramine 4 g packet  Commonly known as:  QUESTRAN  MIX 1 PACKET (4 GRAM TOTAL) IN LIQUID AND DRINK DAILY AS NEEDED FOR DIARRHEA     clonazePAM 1 MG tablet  Commonly known as:  KLONOPIN  Take 1 tablet (1 mg total) by mouth 2 (two) times daily as needed for anxiety.     clopidogrel 75 MG tablet  Commonly known as:  PLAVIX  TAKE 1 TABLET DAILY      desvenlafaxine 100 MG 24 hr tablet  Commonly known as:  PRISTIQ  Take 100 mg by mouth daily.     ergocalciferol 50000 units capsule  Commonly known as:  VITAMIN D2  Take 1 capsule (50,000 Units total) by mouth once a week.     fluticasone 50 MCG/ACT nasal spray  Commonly known as:  FLONASE  USE 2 SPRAYS IN EACH NOSTRIL DAILY AS NEEDED FOR ALLERGIES     formoterol 20 MCG/2ML nebulizer solution  Commonly known as:  PERFOROMIST  Take  2 mLs (20 mcg total) by nebulization 2 (two) times daily.     hydrocortisone 25 MG suppository  Commonly known as:  ANUSOL-HC  Place 25 mg rectally daily as needed.     magnesium oxide 400 (241.3 Mg) MG tablet  Commonly known as:  MAG-OX  Take 1 tablet (400 mg total) by mouth daily.     metFORMIN 500 MG tablet  Commonly known as:  GLUCOPHAGE  TAKE ONE-HALF (1/2) TO ONE TABLET DAILY WITH BREAKFAST     montelukast 10 MG tablet  Commonly known as:  SINGULAIR  Take 10 mg by mouth at bedtime.     OCUVITE PRESERVISION Tabs  Take by mouth 2 (two) times daily.     ondansetron 4 MG tablet  Commonly known as:  ZOFRAN  Take 1 tablet (4 mg total) by mouth every 6 (six) hours as needed for nausea or vomiting.     pantoprazole 40 MG tablet  Commonly known as:  PROTONIX  Take 40 mg by mouth daily.     phenytoin 100 MG ER capsule  Commonly known as:  DILANTIN  Take 200 mg by mouth at bedtime.     potassium chloride 10 MEQ tablet  Commonly known as:  K-DUR  Take 10 mEq by mouth 2 (two) times daily.     saccharomyces boulardii 250 MG capsule  Commonly known as:  FLORASTOR  Take 250 mg by mouth daily.     sodium chloride 0.65 % Soln nasal spray  Commonly known as:  OCEAN  Place 2 sprays into both nostrils daily as needed for congestion.     SYSTANE 0.4-0.3 % Soln  Generic drug:  Polyethyl Glycol-Propyl Glycol  Place 1 drop into both eyes 2 (two) times daily as needed (dry eyes).     traMADol 50 MG tablet  Commonly known as:  ULTRAM  Take 1  tablet (50 mg total) by mouth every 12 (twelve) hours as needed for severe pain.     UNABLE TO FIND  Med Name: Med pass 120 mL 3 times daily between meals for nutritional supplement        Immunizations: Immunization History  Administered Date(s) Administered  . Influenza Split 06/09/2011, 08/04/2012  . Influenza Whole 08/17/2008, 05/28/2010  . Influenza,inj,Quad PF,36+ Mos 07/07/2013  . PPD Test 01/23/2016  . Pneumococcal Polysaccharide-23 03/06/2011  . Td 10/22/2010  . Tdap 06/21/2013     Physical Exam:  Filed Vitals:   01/24/16 1536  BP: 130/73  Pulse: 69  Temp: 97.4 F (36.3 C)  TempSrc: Oral  Resp: 18  Height: 5\' 3"  (1.6 m)  Weight: 125 lb (56.7 kg)  SpO2: 98%   Body mass index is 22.15 kg/(m^2).  General- elderly female, frail, thi built, in no acute distress Head- normocephalic, atraumatic Nose- no nasal discharge Throat- moist mucus membrane Eyes- no pallor, no icterus, no discharge Neck- no cervical lymphadenopathy Cardiovascular- normal s1,s2, + murmur, no leg edema Respiratory- bilateral clear to auscultation, no wheeze, no rhonchi, no crackles, no use of accessory muscles Abdomen- bowel sounds present, soft, non tender Musculoskeletal- able to move all 4 extremities, generalized weakness Neurological- alert and oriented to person, place and time Skin- warm and dry Psychiatry- normal mood and affect    Labs reviewed: Basic Metabolic Panel:  Recent Labs  01/18/16 1655  01/20/16 1059 01/21/16 0445 01/22/16 01/22/16 0444  NA 140  --  141  --  145 145  K 4.4  --  4.1  --   --  3.6  CL 104  --  111  --   --  116*  CO2 27  --  25  --   --  23  GLUCOSE 165*  --  90  --   --  188*  BUN 11  --  11  --  8 8  CREATININE 0.81  --  0.94  --  0.8 0.77  CALCIUM 8.6*  --  8.0*  --   --  8.2*  MG  --   < > 1.4* 1.5*  --  1.7  < > = values in this interval not displayed. Liver Function Tests:  Recent Labs  01/18/16 1655 01/20/16 1059  01/22/16 0444  AST 54* 46* 36  ALT 18 17 14   ALKPHOS 92 92 76  BILITOT 0.9 0.9 0.6  PROT 6.5 5.9* 5.7*  ALBUMIN 3.2* 3.0* 2.7*   No results for input(s): LIPASE, AMYLASE in the last 8760 hours. No results for input(s): AMMONIA in the last 8760 hours. CBC:  Recent Labs  11/11/15 1315 12/26/15 1304 01/18/16 1655  01/20/16 1059 01/21/16 0445 01/22/16 01/22/16 0444  WBC 5.3 5.2 4.9  --  7.6 7.3 6.6 6.6  NEUTROABS 3.6 3.5 3.8  --   --   --   --   --   HGB 9.9* 11.4* 11.1*  --  10.9* 10.3*  --  10.2*  HCT 32.2* 35.2 34.0*  < > 33.0* 31.3*  --  31.1*  MCV 85 88 87.2  --  87.8 87.4  --  88.6  PLT 89* 119* 94*  --  124* 114*  --  114*  < > = values in this interval not displayed. Cardiac Enzymes:  Recent Labs  05/25/15 1845 01/18/16 1655  TROPONINI 0.03 0.03   BNP: Invalid input(s): POCBNP CBG:  Recent Labs  01/23/16 0752 01/23/16 0820 01/23/16 1210  GLUCAP 123* 124* 117*    Radiological Exams: Dg Chest 2 View  01/18/2016  CLINICAL DATA:  Generalized weakness and cough EXAM: CHEST  2 VIEW COMPARISON:  11/16/2015 chest radiograph. FINDINGS: Stable cardiomediastinal silhouette with normal heart size. No pneumothorax. No pleural effusion. Emphysema. No pulmonary edema. No acute consolidative airspace disease. IMPRESSION: Emphysema.  Otherwise no active disease in the chest. Electronically Signed   By: Ilona Sorrel M.D.   On: 01/18/2016 17:29   Ct Head Wo Contrast  01/18/2016  CLINICAL DATA:  Progressive weakness and altered mental status. EXAM: CT HEAD WITHOUT CONTRAST TECHNIQUE: Contiguous axial images were obtained from the base of the skull through the vertex without intravenous contrast. COMPARISON:  05/25/2015 FINDINGS: There is no intracranial hemorrhage, mass or evidence of acute infarction. There is no extra-axial fluid collection. There is moderate generalized atrophy. There is white matter hypodensity consistent with small vessel ischemic disease. Chronic lacunar  infarct in the right cerebellar hemisphere. Probable additional chronic lacunar infarct in the right putamen. No acute findings are evident. No interval change is evident. There is no bony abnormality. Visible paranasal sinuses are clear. Visible orbits are unremarkable. IMPRESSION: No acute intracranial findings. There is moderate generalized atrophy and small vessel ischemic changes. Electronically Signed   By: Andreas Newport M.D.   On: 01/18/2016 21:34   Mr Brain Wo Contrast  01/19/2016  CLINICAL DATA:  Lethargy with slurred speech. Impaired gait. Symptoms for 24 hours. Seizure disorder. Diabetes. Hypertension. Anxiety, COPD. Cirrhosis. EXAM: MRI HEAD WITHOUT CONTRAST MRA HEAD WITHOUT CONTRAST TECHNIQUE: Multiplanar, multiecho pulse sequences of the brain and surrounding structures were obtained  without intravenous contrast. Angiographic images of the head were obtained using MRA technique without contrast. COMPARISON:  CT head 01/18/2016. FINDINGS: MRI HEAD FINDINGS The patient was unable to remain motionless for the exam. Small or subtle lesions could be overlooked. No evidence for acute infarction, hemorrhage, mass lesion, hydrocephalus, or extra-axial fluid. Global atrophy. Extensive white matter disease. Chronic LEFT occipital infarct. Chronic RIGHT cerebellar infarct. Flow voids are maintained. Widespread areas of chronic hemorrhage throughout the cerebral hemispheres, cerebellum, and deep nuclei. No appreciable T1 shortening of any of the lesions. Findings most consistent with chronic microbleeds from longstanding hypertensive cerebrovascular disease. Cerebral amyloid angiopathy, chronic coagulopathy, or multiple cavernomas are less favored. No midline abnormality. Extracranial soft tissues unremarkable. BILATERAL cataract extraction. MRA HEAD FINDINGS Motion degraded exam. Internal carotid arteries widely patent. Basilar artery widely patent with vertebrals codominant. No intracranial stenosis or  aneurysm. IMPRESSION: No acute stroke is evident.  Atrophy with small vessel disease. Widespread areas of chronic hemorrhage throughout the brain, favored to represent sequelae of longstanding hypertensive cerebrovascular disease. No proximal large vessel stenosis or occlusion. Electronically Signed   By: Staci Righter M.D.   On: 01/19/2016 13:03   Mr Jodene Nam Head/brain Wo Cm  01/19/2016  CLINICAL DATA:  Lethargy with slurred speech. Impaired gait. Symptoms for 24 hours. Seizure disorder. Diabetes. Hypertension. Anxiety, COPD. Cirrhosis. EXAM: MRI HEAD WITHOUT CONTRAST MRA HEAD WITHOUT CONTRAST TECHNIQUE: Multiplanar, multiecho pulse sequences of the brain and surrounding structures were obtained without intravenous contrast. Angiographic images of the head were obtained using MRA technique without contrast. COMPARISON:  CT head 01/18/2016. FINDINGS: MRI HEAD FINDINGS The patient was unable to remain motionless for the exam. Small or subtle lesions could be overlooked. No evidence for acute infarction, hemorrhage, mass lesion, hydrocephalus, or extra-axial fluid. Global atrophy. Extensive white matter disease. Chronic LEFT occipital infarct. Chronic RIGHT cerebellar infarct. Flow voids are maintained. Widespread areas of chronic hemorrhage throughout the cerebral hemispheres, cerebellum, and deep nuclei. No appreciable T1 shortening of any of the lesions. Findings most consistent with chronic microbleeds from longstanding hypertensive cerebrovascular disease. Cerebral amyloid angiopathy, chronic coagulopathy, or multiple cavernomas are less favored. No midline abnormality. Extracranial soft tissues unremarkable. BILATERAL cataract extraction. MRA HEAD FINDINGS Motion degraded exam. Internal carotid arteries widely patent. Basilar artery widely patent with vertebrals codominant. No intracranial stenosis or aneurysm. IMPRESSION: No acute stroke is evident.  Atrophy with small vessel disease. Widespread areas of chronic  hemorrhage throughout the brain, favored to represent sequelae of longstanding hypertensive cerebrovascular disease. No proximal large vessel stenosis or occlusion. Electronically Signed   By: Staci Righter M.D.   On: 01/19/2016 13:03    Assessment/Plan  Generalized weakness Will have her work with physical therapy and occupational therapy team to help with gait training and muscle strengthening exercises.fall precautions. Skin care. Encourage to be out of bed.   metabolic encephalopathy Confirmed with EEG. Alert and oriented this visit. Monitor clinically for now  Anemia of chronic disease Monitor cbc  Thrombocytopenia No bleed reported. Has hx of cirrhosis. Monitor platelet count  HTN Monitor bp, continue norvasc 5 mg daily  Protein calorie malnutrition Monitor and encourage po intake. Continue feeding supplement  Anxiety Stable, continue klonopin 1 mg bid prn  Depression Stable mood, continue pristiq  DM type 2 with vascular disease Lab Results  Component Value Date   HGBA1C 5.3 01/19/2016  continue metformin 250 mg daily, monitor cbg  COPD Continue montelukast and her bronchodilators  gerd Stable, continue protonix 40 mg daily in am  Seizure disorder Remains seizure fee. Continue dilantin 200 mg qhs  PVD Continue plavix for now   Goals of care: short term rehabilitation   Labs/tests ordered: cbc, cmp 01/27/16  Family/ staff Communication: reviewed care plan with patient and nursing supervisor    Blanchie Serve, MD Internal Medicine [place], [place] [address] Cell Phone (Monday-Friday 8 am - 5 pm): [phone] On Call: [phone] and follow prompts after 5 pm and on weekends Office Phone: [phone] Office Fax: [phone]

## 2016-01-27 LAB — HEPATIC FUNCTION PANEL
ALT: 17 U/L (ref 7–35)
AST: 39 U/L — AB (ref 13–35)
Alkaline Phosphatase: 99 U/L (ref 25–125)
BILIRUBIN, TOTAL: 0.7 mg/dL

## 2016-01-27 LAB — CBC AND DIFFERENTIAL
HCT: 33 % — AB (ref 36–46)
Hemoglobin: 10.7 g/dL — AB (ref 12.0–16.0)
NEUTROS ABS: 3 /uL
PLATELETS: 116 10*3/uL — AB (ref 150–399)
WBC: 5.1 10^3/mL

## 2016-01-27 LAB — BASIC METABOLIC PANEL
BUN: 10 mg/dL (ref 4–21)
CREATININE: 0.8 mg/dL (ref 0.5–1.1)
GLUCOSE: 88 mg/dL
Potassium: 3.7 mmol/L (ref 3.4–5.3)
Sodium: 145 mmol/L (ref 137–147)

## 2016-01-28 ENCOUNTER — Encounter: Payer: Self-pay | Admitting: Internal Medicine

## 2016-01-28 ENCOUNTER — Encounter: Payer: Self-pay | Admitting: Family

## 2016-01-29 ENCOUNTER — Encounter: Payer: Self-pay | Admitting: Family

## 2016-01-29 ENCOUNTER — Encounter: Payer: Self-pay | Admitting: Adult Health

## 2016-01-29 ENCOUNTER — Non-Acute Institutional Stay (SKILLED_NURSING_FACILITY): Payer: Medicare Other | Admitting: Adult Health

## 2016-01-29 DIAGNOSIS — D696 Thrombocytopenia, unspecified: Secondary | ICD-10-CM

## 2016-01-29 DIAGNOSIS — E43 Unspecified severe protein-calorie malnutrition: Secondary | ICD-10-CM

## 2016-01-29 NOTE — Progress Notes (Signed)
Patient ID: Monique Fleming, female   DOB: March 28, 1929, 82 y.o.   MRN: [id number]    DATE:  01/29/16  MRN:  [id number]  BIRTHDAY: [birth year]  Facility:  Nursing Home Location:  [place] Room Number: [id number]  LEVEL OF CARE:  SNF [phone])  Contact Information    Name Relation Home Work Mobile   Gillingham,Betsy Daughter [phone]  [phone]   Casey,Vicki Daughter [phone]  [phone]       Code Status History    Date Active Date Inactive Code Status Order ID Comments User Context   01/18/2016 11:54 PM 01/23/2016  4:54 PM DNR [id number]  Vianne Bulls, MD Inpatient   08/22/2014  5:39 PM 08/24/2014  9:19 PM Full Code [id number]  Bonnielee Haff, MD Inpatient   03/14/2014  6:06 PM 03/15/2014  7:18 PM DNR [id number]  Samuella Cota, MD Inpatient    Questions for Most Recent Historical Code Status (Order [id number])    Question Answer Comment   In the event of cardiac or respiratory ARREST Do not call a "code blue"    In the event of cardiac or respiratory ARREST Do not perform Intubation, CPR, defibrillation or ACLS    In the event of cardiac or respiratory ARREST Use medication by any route, position, wound care, and other measures to relive pain and suffering. May use oxygen, suction and manual treatment of airway obstruction as needed for comfort.        Chief Complaint  Patient presents with  . Acute Visit    Protein calorie malnutrition, thrombocytopenia    HISTORY OF PRESENT ILLNESS:  This is an 82 year old female who is was noted to have albumin 2.96, low and platelet 116, low. No bruising nor bleeding noted.   She has been admitted to [facility] on 01/23/16 from [facility]. She has PMH of seizure disorder, anxiety, type 2 diabetes mellitus and COPD. She was hospitalized due to acute encephalopathy and failure to thrive. Acute CVA was ruled out with CT head and MRI brain. Infectious etiology was ruled out. EEG findings were  consistent with metabolic encephalopathy. She was seen by neurology.  She has been admitted for a short-term rehabilitation.   PAST MEDICAL HISTORY:  Past Medical History  Diagnosis Date  . Aortic stenosis     moderate by echo 6/11 gradient 20/100mmHg for mean and peak  . TIA (transient ischemic attack)     Dr. [name]  . Peripheral vascular disease ([place])   . IBS (irritable bowel syndrome)   . Diabetes mellitus     type II  . Chronic bronchitis   . COPD (chronic obstructive pulmonary disease) ([place])   . Atherosclerosis   . Depression   . Anxiety     Dr Casimiro Needle  . Renal cyst   . Anemia     iron deficiency  . Cellulitis   . Diverticular disease   . Infectious diarrhea(009.2)   . Sinusitis   . Thrush   . Abnormal liver function test   . GERD (gastroesophageal reflux disease)   . Insomnia     chronic  . Adenocarcinoma ([place]) 2008    colon,s/p right hemicolectomy  . Angina   . Seizures (HCC)     hx of one seizure "  . Cirrhosis of liver (HCC)     hx of  . Clostridium difficile diarrhea   . Thrombocytopenia ([place]) 04/01/2013  . Bursitis     spine per pt  . Macular  degeneration 02/18/2014    Follows with [facility]  . Internal hemorrhoids   . Asthma   . Elevated BP 07/09/2015  . Type 2 diabetes mellitus with vascular disease ([place]) 03/02/2007    Chronic       CURRENT MEDICATIONS: Reviewed  Patient's Medications  New Prescriptions   No medications on file  Previous Medications   ALBUTEROL (PROAIR HFA) 108 (90 BASE) MCG/ACT INHALER    Inhale 2 puffs into the lungs every 4 (four) hours as needed for wheezing or shortness of breath.   AMLODIPINE (NORVASC) 5 MG TABLET    Take 1 tablet (5 mg total) by mouth daily.   CHOLECALCIFEROL (VITAMIN D3) 50000 UNITS CAPS    Take 1 capsule by mouth once a week. Give on Mondays   CHOLESTYRAMINE (QUESTRAN) 4 G PACKET    MIX 1 PACKET (4 GRAM TOTAL) IN LIQUID AND DRINK DAILY AS NEEDED FOR DIARRHEA   CLONAZEPAM (KLONOPIN) 1 MG TABLET    Take 1  tablet (1 mg total) by mouth 2 (two) times daily as needed for anxiety.   CLOPIDOGREL (PLAVIX) 75 MG TABLET    TAKE 1 TABLET DAILY   DESVENLAFAXINE (PRISTIQ) 100 MG 24 HR TABLET    Take 100 mg by mouth daily.   FLUTICASONE (FLONASE) 50 MCG/ACT NASAL SPRAY    USE 2 SPRAYS IN EACH NOSTRIL DAILY AS NEEDED FOR ALLERGIES   FORMOTEROL (PERFOROMIST) 20 MCG/2ML NEBULIZER SOLUTION    Take 2 mLs (20 mcg total) by nebulization 2 (two) times daily.   HYDROCORTISONE (ANUSOL-HC) 25 MG SUPPOSITORY    Place 25 mg rectally daily as needed.   MAGNESIUM OXIDE (MAG-OX) 400 (241.3 MG) MG TABLET    Take 1 tablet (400 mg total) by mouth daily.   METFORMIN (GLUCOPHAGE) 500 MG TABLET    TAKE ONE-HALF (1/2) TO ONE TABLET DAILY WITH BREAKFAST   MONTELUKAST (SINGULAIR) 10 MG TABLET    Take 10 mg by mouth at bedtime.   MULTIPLE VITAMINS-MINERALS (OCUVITE PRESERVISION) TABS    Take by mouth 2 (two) times daily.   ONDANSETRON (ZOFRAN) 4 MG TABLET    Take 1 tablet (4 mg total) by mouth every 6 (six) hours as needed for nausea or vomiting.   PANTOPRAZOLE (PROTONIX) 40 MG TABLET    Take 40 mg by mouth daily.   PHENYTOIN (DILANTIN) 100 MG ER CAPSULE    Take 200 mg by mouth at bedtime.   POLYETHYL GLYCOL-PROPYL GLYCOL (SYSTANE) 0.4-0.3 % SOLN    Place 1 drop into both eyes 2 (two) times daily as needed (dry eyes).    POTASSIUM CHLORIDE (K-DUR) 10 MEQ TABLET    Take 10 mEq by mouth 2 (two) times daily.   SACCHAROMYCES BOULARDII (FLORASTOR) 250 MG CAPSULE    Take 250 mg by mouth daily.   SODIUM CHLORIDE (OCEAN) 0.65 % SOLN NASAL SPRAY    Place 2 sprays into both nostrils daily as needed for congestion.    TRAMADOL (ULTRAM) 50 MG TABLET    Take 1 tablet (50 mg total) by mouth every 12 (twelve) hours as needed for severe pain.   UNABLE TO FIND    Med Name: Med pass 120 mL 3 times daily between meals for nutritional supplement  Modified Medications   No medications on file  Discontinued Medications   ERGOCALCIFEROL (VITAMIN D2) 50000  UNITS CAPSULE    Take 1 capsule (50,000 Units total) by mouth once a week.     Allergies  Allergen Reactions  .  Bactrim [Sulfamethoxazole-Trimethoprim] Shortness Of Breath  . Levofloxacin Other (See Comments)    seizure  . Pneumovax [Pneumococcal Polysaccharide Vaccine] Other (See Comments)    Severe allergy-cellulitis  . Amoxicillin-Pot Clavulanate Other (See Comments)    Patient stated Drs. recommend taking Augmentin due to her PCN allergy.  . Penicillins Rash    Has patient had a PCN reaction causing immediate rash, facial/tongue/throat swelling, SOB or lightheadedness with hypotension: No Has patient had a PCN reaction causing severe rash involving mucus membranes or skin necrosis: No Has patient had a PCN reaction that required hospitalization No Has patient had a PCN reaction occurring within the last 10 years: No If all of the above answers are "NO", then may proceed with Cephalosporin use.   . Bupropion   . Haldol [Haloperidol Lactate]     Had unknown reaction many yrs ago per pt  . Haloperidol   . Morphine   . Morphine And Related     Confused and agitation  . Pneumococcal Vaccine   . Pseudoephedrine Hcl   . Sudafed [Pseudoephedrine Hcl] Other (See Comments)    seizure     REVIEW OF SYSTEMS:    GENERAL: no change in appetite, no fatigue, no weight changes, no fever, chills SKIN: Denies rash, itching, wounds, ulcer sores, or nail abnormality EYES: Denies change in vision, dry eyes, eye pain, itching or discharge EARS: Hard of hearing NOSE: Denies nasal congestion or epistaxis MOUTH and THROAT: Denies oral discomfort, gingival pain or bleeding, pain from teeth or hoarseness   RESPIRATORY: no cough, SOB, DOE, wheezing, hemoptysis CARDIAC: no chest pain, edema or palpitations GI: no abdominal pain, diarrhea, constipation, heart burn, nausea or vomiting GU: Denies dysuria, frequency, hematuria, incontinence, or discharge PSYCHIATRIC: Denies feeling of depression or  anxiety. No report of hallucinations, insomnia, paranoia, or agitation   PHYSICAL EXAMINATION  GENERAL APPEARANCE: Well nourished. In no acute distress. Normal body habitus SKIN:  Skin is warm and dry.  HEAD: Normal in size and contour. No evidence of trauma EYES: Lids open and close normally. No blepharitis, entropion or ectropion. PERRL. Conjunctivae are clear and sclerae are white. Lenses are without opacity EARS: Pinnae are normal. Patient hears normal voice tunes of the examiner MOUTH and THROAT: Lips are without lesions. Oral mucosa is moist and without lesions. Tongue is normal in shape, size, and color and without lesions NECK: supple, trachea midline, no neck masses, no thyroid tenderness, no thyromegaly LYMPHATICS: no LAN in the neck, no supraclavicular LAN RESPIRATORY: breathing is even & unlabored, BS CTAB CARDIAC: RRR, no murmur,no extra heart sounds, no edema GI: abdomen soft, normal BS, no masses, no tenderness, no hepatomegaly, no splenomegaly EXTREMITIES:  Able to move X 4 extremities PSYCHIATRIC: Alert and oriented X 3. Affect and behavior are appropriate  LABS/RADIOLOGY: Labs reviewed: Basic Metabolic Panel:  Recent Labs  01/18/16 1655  01/20/16 1059 01/21/16 0445 01/22/16 01/22/16 0444 01/27/16  NA 140  --  141  --  145 145 145  K 4.4  --  4.1  --   --  3.6 3.7  CL 104  --  111  --   --  116*  --   CO2 27  --  25  --   --  23  --   GLUCOSE 165*  --  90  --   --  188*  --   BUN 11  --  11  --  8 8 10   CREATININE 0.81  --  0.94  --  0.8 0.77 0.8  CALCIUM 8.6*  --  8.0*  --   --  8.2*  --   MG  --   < > 1.4* 1.5*  --  1.7  --   < > = values in this interval not displayed. Liver Function Tests:  Recent Labs  01/18/16 1655 01/20/16 1059 01/22/16 0444 01/27/16  AST 54* 46* 36 39*  ALT 18 17 14 17   ALKPHOS 92 92 76 99  BILITOT 0.9 0.9 0.6  --   PROT 6.5 5.9* 5.7*  --   ALBUMIN 3.2* 3.0* 2.7*  --    CBC:  Recent Labs  12/26/15 1304 01/18/16 1655   01/20/16 1059 01/21/16 0445 01/22/16 01/22/16 0444 01/27/16  WBC 5.2 4.9  --  7.6 7.3 6.6 6.6 5.1  NEUTROABS 3.5 3.8  --   --   --   --   --  3  HGB 11.4* 11.1*  --  10.9* 10.3*  --  10.2* 10.7*  HCT 35.2 34.0*  < > 33.0* 31.3*  --  31.1* 33*  MCV 88 87.2  --  87.8 87.4  --  88.6  --   PLT 119* 94*  --  124* 114*  --  114* 116*  < > = values in this interval not displayed.  Lipid Panel:  Recent Labs  07/25/15 0820 01/19/16 0524  HDL 24* 26*   Cardiac Enzymes:  Recent Labs  05/25/15 1845 01/18/16 1655  TROPONINI 0.03 0.03   CBG:  Recent Labs  01/23/16 0752 01/23/16 0820 01/23/16 1210  GLUCAP 123* 124* 117*      Dg Chest 2 View  01/18/2016  CLINICAL DATA:  Generalized weakness and cough EXAM: CHEST  2 VIEW COMPARISON:  11/16/2015 chest radiograph. FINDINGS: Stable cardiomediastinal silhouette with normal heart size. No pneumothorax. No pleural effusion. Emphysema. No pulmonary edema. No acute consolidative airspace disease. IMPRESSION: Emphysema.  Otherwise no active disease in the chest. Electronically Signed   By: Ilona Sorrel M.D.   On: 01/18/2016 17:29   Ct Head Wo Contrast  01/18/2016  CLINICAL DATA:  Progressive weakness and altered mental status. EXAM: CT HEAD WITHOUT CONTRAST TECHNIQUE: Contiguous axial images were obtained from the base of the skull through the vertex without intravenous contrast. COMPARISON:  05/25/2015 FINDINGS: There is no intracranial hemorrhage, mass or evidence of acute infarction. There is no extra-axial fluid collection. There is moderate generalized atrophy. There is white matter hypodensity consistent with small vessel ischemic disease. Chronic lacunar infarct in the right cerebellar hemisphere. Probable additional chronic lacunar infarct in the right putamen. No acute findings are evident. No interval change is evident. There is no bony abnormality. Visible paranasal sinuses are clear. Visible orbits are unremarkable. IMPRESSION: No acute  intracranial findings. There is moderate generalized atrophy and small vessel ischemic changes. Electronically Signed   By: Andreas Newport M.D.   On: 01/18/2016 21:34   Mr Brain Wo Contrast  01/19/2016  CLINICAL DATA:  Lethargy with slurred speech. Impaired gait. Symptoms for 24 hours. Seizure disorder. Diabetes. Hypertension. Anxiety, COPD. Cirrhosis. EXAM: MRI HEAD WITHOUT CONTRAST MRA HEAD WITHOUT CONTRAST TECHNIQUE: Multiplanar, multiecho pulse sequences of the brain and surrounding structures were obtained without intravenous contrast. Angiographic images of the head were obtained using MRA technique without contrast. COMPARISON:  CT head 01/18/2016. FINDINGS: MRI HEAD FINDINGS The patient was unable to remain motionless for the exam. Small or subtle lesions could be overlooked. No evidence for acute infarction, hemorrhage, mass lesion, hydrocephalus, or extra-axial  fluid. Global atrophy. Extensive white matter disease. Chronic LEFT occipital infarct. Chronic RIGHT cerebellar infarct. Flow voids are maintained. Widespread areas of chronic hemorrhage throughout the cerebral hemispheres, cerebellum, and deep nuclei. No appreciable T1 shortening of any of the lesions. Findings most consistent with chronic microbleeds from longstanding hypertensive cerebrovascular disease. Cerebral amyloid angiopathy, chronic coagulopathy, or multiple cavernomas are less favored. No midline abnormality. Extracranial soft tissues unremarkable. BILATERAL cataract extraction. MRA HEAD FINDINGS Motion degraded exam. Internal carotid arteries widely patent. Basilar artery widely patent with vertebrals codominant. No intracranial stenosis or aneurysm. IMPRESSION: No acute stroke is evident.  Atrophy with small vessel disease. Widespread areas of chronic hemorrhage throughout the brain, favored to represent sequelae of longstanding hypertensive cerebrovascular disease. No proximal large vessel stenosis or occlusion. Electronically  Signed   By: Staci Righter M.D.   On: 01/19/2016 13:03   Mr Jodene Nam Head/brain Wo Cm  01/19/2016  CLINICAL DATA:  Lethargy with slurred speech. Impaired gait. Symptoms for 24 hours. Seizure disorder. Diabetes. Hypertension. Anxiety, COPD. Cirrhosis. EXAM: MRI HEAD WITHOUT CONTRAST MRA HEAD WITHOUT CONTRAST TECHNIQUE: Multiplanar, multiecho pulse sequences of the brain and surrounding structures were obtained without intravenous contrast. Angiographic images of the head were obtained using MRA technique without contrast. COMPARISON:  CT head 01/18/2016. FINDINGS: MRI HEAD FINDINGS The patient was unable to remain motionless for the exam. Small or subtle lesions could be overlooked. No evidence for acute infarction, hemorrhage, mass lesion, hydrocephalus, or extra-axial fluid. Global atrophy. Extensive white matter disease. Chronic LEFT occipital infarct. Chronic RIGHT cerebellar infarct. Flow voids are maintained. Widespread areas of chronic hemorrhage throughout the cerebral hemispheres, cerebellum, and deep nuclei. No appreciable T1 shortening of any of the lesions. Findings most consistent with chronic microbleeds from longstanding hypertensive cerebrovascular disease. Cerebral amyloid angiopathy, chronic coagulopathy, or multiple cavernomas are less favored. No midline abnormality. Extracranial soft tissues unremarkable. BILATERAL cataract extraction. MRA HEAD FINDINGS Motion degraded exam. Internal carotid arteries widely patent. Basilar artery widely patent with vertebrals codominant. No intracranial stenosis or aneurysm. IMPRESSION: No acute stroke is evident.  Atrophy with small vessel disease. Widespread areas of chronic hemorrhage throughout the brain, favored to represent sequelae of longstanding hypertensive cerebrovascular disease. No proximal large vessel stenosis or occlusion. Electronically Signed   By: Staci Righter M.D.   On: 01/19/2016 13:03    ASSESSMENT/PLAN:  Protein-calorie malnutrition,  severe - albumin 2.96; start Procel 2 scoops PO BID; RD consultation  Thrombocytopenia - platelet 116; repeat cbc on 02/03/16     [facility], NP [place]

## 2016-01-31 ENCOUNTER — Encounter: Payer: Self-pay | Admitting: Family

## 2016-02-03 LAB — CBC AND DIFFERENTIAL
HCT: 35 % — AB (ref 36–46)
Hemoglobin: 11.1 g/dL — AB (ref 12.0–16.0)
Neutrophils Absolute: 5 /uL
PLATELETS: 144 10*3/uL — AB (ref 150–399)
WBC: 7.4 10*3/mL

## 2016-02-04 ENCOUNTER — Ambulatory Visit (HOSPITAL_BASED_OUTPATIENT_CLINIC_OR_DEPARTMENT_OTHER): Payer: Medicare Other | Admitting: Family

## 2016-02-04 ENCOUNTER — Other Ambulatory Visit (HOSPITAL_BASED_OUTPATIENT_CLINIC_OR_DEPARTMENT_OTHER): Payer: Medicare Other

## 2016-02-04 ENCOUNTER — Encounter: Payer: Self-pay | Admitting: Medical

## 2016-02-04 ENCOUNTER — Encounter: Payer: Self-pay | Admitting: Family

## 2016-02-04 ENCOUNTER — Encounter: Payer: Self-pay | Admitting: Adult Health

## 2016-02-04 ENCOUNTER — Telehealth: Payer: Self-pay | Admitting: Family Medicine

## 2016-02-04 ENCOUNTER — Non-Acute Institutional Stay (SKILLED_NURSING_FACILITY): Payer: Medicare Other | Admitting: Adult Health

## 2016-02-04 ENCOUNTER — Ambulatory Visit (HOSPITAL_BASED_OUTPATIENT_CLINIC_OR_DEPARTMENT_OTHER): Payer: Medicare Other

## 2016-02-04 VITALS — BP 135/62 | HR 84 | Temp 98.0°F | Resp 18 | Ht 63.0 in | Wt 114.0 lb

## 2016-02-04 DIAGNOSIS — Z85038 Personal history of other malignant neoplasm of large intestine: Secondary | ICD-10-CM | POA: Diagnosis not present

## 2016-02-04 DIAGNOSIS — K641 Second degree hemorrhoids: Secondary | ICD-10-CM

## 2016-02-04 DIAGNOSIS — D500 Iron deficiency anemia secondary to blood loss (chronic): Secondary | ICD-10-CM

## 2016-02-04 DIAGNOSIS — K649 Unspecified hemorrhoids: Secondary | ICD-10-CM | POA: Diagnosis not present

## 2016-02-04 DIAGNOSIS — D649 Anemia, unspecified: Secondary | ICD-10-CM

## 2016-02-04 DIAGNOSIS — D509 Iron deficiency anemia, unspecified: Secondary | ICD-10-CM

## 2016-02-04 LAB — COMPREHENSIVE METABOLIC PANEL
ALT: 33 U/L (ref 0–55)
AST: 55 U/L — ABNORMAL HIGH (ref 5–34)
Albumin: 3.2 g/dL — ABNORMAL LOW (ref 3.5–5.0)
Alkaline Phosphatase: 101 U/L (ref 40–150)
Anion Gap: 9 mEq/L (ref 3–11)
BILIRUBIN TOTAL: 0.5 mg/dL (ref 0.20–1.20)
BUN: 16.4 mg/dL (ref 7.0–26.0)
CO2: 26 meq/L (ref 22–29)
CREATININE: 1.2 mg/dL — AB (ref 0.6–1.1)
Calcium: 9.3 mg/dL (ref 8.4–10.4)
Chloride: 108 mEq/L (ref 98–109)
EGFR: 44 mL/min/{1.73_m2} — ABNORMAL LOW (ref >90)
GLUCOSE: 125 mg/dL (ref 70–140)
Potassium: 4.3 mEq/L (ref 3.5–5.1)
SODIUM: 143 meq/L (ref 136–145)
TOTAL PROTEIN: 6.8 g/dL (ref 6.4–8.3)

## 2016-02-04 LAB — IRON AND TIBC
%SAT: 35 % (ref 21–57)
Iron: 73 ug/dL (ref 41–142)
TIBC: 205 ug/dL — AB (ref 236–444)
UIBC: 132 ug/dL (ref 120–384)

## 2016-02-04 LAB — CBC WITH DIFFERENTIAL (CANCER CENTER ONLY)
BASO#: 0 10*3/uL (ref 0.0–0.2)
BASO%: 0.6 % (ref 0.0–2.0)
EOS%: 4.4 % (ref 0.0–7.0)
Eosinophils Absolute: 0.3 10*3/uL (ref 0.0–0.5)
HCT: 33.6 % — ABNORMAL LOW (ref 34.8–46.6)
HGB: 11.2 g/dL — ABNORMAL LOW (ref 11.6–15.9)
LYMPH#: 1.1 10*3/uL (ref 0.9–3.3)
LYMPH%: 15.7 % (ref 14.0–48.0)
MCH: 30.4 pg (ref 26.0–34.0)
MCHC: 33.3 g/dL (ref 32.0–36.0)
MCV: 91 fL (ref 81–101)
MONO#: 0.6 10*3/uL (ref 0.1–0.9)
MONO%: 9.3 % (ref 0.0–13.0)
NEUT#: 4.7 10*3/uL (ref 1.5–6.5)
NEUT%: 70 % (ref 39.6–80.0)
PLATELETS: 142 10*3/uL — AB (ref 145–400)
RBC: 3.68 10*6/uL — AB (ref 3.70–5.32)
RDW: 17.4 % — ABNORMAL HIGH (ref 11.1–15.7)
WBC: 6.8 10*3/uL (ref 3.9–10.0)

## 2016-02-04 LAB — LACTATE DEHYDROGENASE: LDH: 229 U/L (ref 125–245)

## 2016-02-04 LAB — FERRITIN: FERRITIN: 367 ng/mL — AB (ref 9–269)

## 2016-02-04 MED ORDER — SODIUM CHLORIDE 0.9 % IV SOLN: Freq: Once | INTRAVENOUS | Status: DC

## 2016-02-04 MED ORDER — SODIUM CHLORIDE 0.9 % IV SOLN
Freq: Once | INTRAVENOUS | Status: AC
  Administered 2016-02-04: 12:00:00 via INTRAVENOUS

## 2016-02-04 MED ORDER — SODIUM CHLORIDE 0.9 % IV SOLN
510.0000 mg | Freq: Once | INTRAVENOUS | Status: AC
  Administered 2016-02-04: 510 mg via INTRAVENOUS
  Filled 2016-02-04: qty 17

## 2016-02-04 MED ORDER — ALTEPLASE 2 MG IJ SOLR
2.0000 mg | Freq: Once | INTRAMUSCULAR | Status: DC | PRN
  Filled 2016-02-04: qty 2

## 2016-02-04 NOTE — Patient Instructions (Signed)

## 2016-02-04 NOTE — Progress Notes (Signed)
Hematology and Oncology Follow Up Visit  JACOBY BEIRNE [id number] 05-25-1929 82 y.o. 02/04/2016   Principle Diagnosis:  1. Stage I (T1 N0 M0) adenocarcinoma of the right colon 2. Intermittent iron deficiency anemia secondary to chronic blood loss  Current Therapy:   Observation    Interim History: Ms. Strutz is here today with her daughter for a follow-up. She was hospitalized in April with metabolic encephalopathy. She was having fatigue, confusion and slurred speech. Her scans were negative for stroke.  She is now in a skilled nursing facility receiving PT and will be going home on Friday. She will be discharged home and her family plans to stay at her house with her for 2-3 weeks letting he get back to a normal routine and then start the transition to moving her in with her daughter Olegario Shearer. She misses her dog and has been a little down being away from her.  She is symptomatic at this time with fatigued, occasional palpitations, chills and SOB even at rest.  Her appetite is slowly improving. She is supplementing between meals with carnation instant breakfast. She is trying to stay well hydrated. Her weight is down 6 lbs since her last visit.  No fever, n/v, cough, rash, headache, dizziness, chest pain, abdominal pain, changes in bowel or bladder habits.  She is doing well with PT and has progressed to stand by assist when ambulating. She uses a walker if needed as she is still "wobbly" at times. She has had no syncopal episodes or falls. No episodes of bleeding or bruising. No lymphadenopathy found on exam.   Medications:    Medication List       This list is accurate as of: 02/04/16 11:25 AM.  Always use your most recent med list.               albuterol 108 (90 Base) MCG/ACT inhaler  Commonly known as:  PROAIR HFA  Inhale 2 puffs into the lungs every 4 (four) hours as needed for wheezing or shortness of breath.     amLODipine 5 MG tablet  Commonly known as:  NORVASC  Take 1  tablet (5 mg total) by mouth daily.     cholestyramine 4 g packet  Commonly known as:  QUESTRAN  MIX 1 PACKET (4 GRAM TOTAL) IN LIQUID AND DRINK DAILY AS NEEDED FOR DIARRHEA     clonazePAM 1 MG tablet  Commonly known as:  KLONOPIN  Take 1 tablet (1 mg total) by mouth 2 (two) times daily as needed for anxiety.     clopidogrel 75 MG tablet  Commonly known as:  PLAVIX  TAKE 1 TABLET DAILY     desvenlafaxine 100 MG 24 hr tablet  Commonly known as:  PRISTIQ  Take 100 mg by mouth daily.     fluticasone 50 MCG/ACT nasal spray  Commonly known as:  FLONASE  USE 2 SPRAYS IN EACH NOSTRIL DAILY AS NEEDED FOR ALLERGIES     formoterol 20 MCG/2ML nebulizer solution  Commonly known as:  PERFOROMIST  Take 2 mLs (20 mcg total) by nebulization 2 (two) times daily.     hydrocortisone 25 MG suppository  Commonly known as:  ANUSOL-HC  Place 25 mg rectally daily as needed.     magnesium oxide 400 (241.3 Mg) MG tablet  Commonly known as:  MAG-OX  Take 1 tablet (400 mg total) by mouth daily.     metFORMIN 500 MG tablet  Commonly known as:  GLUCOPHAGE  TAKE ONE-HALF (1/2) TO  ONE TABLET DAILY WITH BREAKFAST     montelukast 10 MG tablet  Commonly known as:  SINGULAIR  Take 10 mg by mouth at bedtime.     OCUVITE PRESERVISION Tabs  Take by mouth 2 (two) times daily.     ondansetron 4 MG tablet  Commonly known as:  ZOFRAN  Take 1 tablet (4 mg total) by mouth every 6 (six) hours as needed for nausea or vomiting.     pantoprazole 40 MG tablet  Commonly known as:  PROTONIX  Take 40 mg by mouth daily.     phenytoin 100 MG ER capsule  Commonly known as:  DILANTIN  Take 200 mg by mouth at bedtime.     potassium chloride 10 MEQ tablet  Commonly known as:  K-DUR  Take 10 mEq by mouth 2 (two) times daily.     saccharomyces boulardii 250 MG capsule  Commonly known as:  FLORASTOR  Take 250 mg by mouth daily.     sodium chloride 0.65 % Soln nasal spray  Commonly known as:  OCEAN  Place 2  sprays into both nostrils daily as needed for congestion.     SYSTANE 0.4-0.3 % Soln  Generic drug:  Polyethyl Glycol-Propyl Glycol  Place 1 drop into both eyes 2 (two) times daily as needed (dry eyes).     traMADol 50 MG tablet  Commonly known as:  ULTRAM  Take 1 tablet (50 mg total) by mouth every 12 (twelve) hours as needed for severe pain.     UNABLE TO FIND  Med Name: Med pass 120 mL 3 times daily between meals for nutritional supplement     Vitamin D3 50000 units Caps  Take 1 capsule by mouth once a week. Give on Mondays        Allergies:  Allergies  Allergen Reactions  . Bactrim [Sulfamethoxazole-Trimethoprim] Shortness Of Breath  . Levofloxacin Other (See Comments)    seizure  . Pneumovax [Pneumococcal Polysaccharide Vaccine] Other (See Comments)    Severe allergy-cellulitis  . Amoxicillin-Pot Clavulanate Other (See Comments)    Patient stated Drs. recommend taking Augmentin due to her PCN allergy.  . Penicillins Rash    Has patient had a PCN reaction causing immediate rash, facial/tongue/throat swelling, SOB or lightheadedness with hypotension: No Has patient had a PCN reaction causing severe rash involving mucus membranes or skin necrosis: No Has patient had a PCN reaction that required hospitalization No Has patient had a PCN reaction occurring within the last 10 years: No If all of the above answers are "NO", then may proceed with Cephalosporin use.   . Bupropion   . Haldol [Haloperidol Lactate]     Had unknown reaction many yrs ago per pt  . Haloperidol   . Morphine   . Morphine And Related     Confused and agitation  . Pneumococcal Vaccine   . Pseudoephedrine Hcl   . Sudafed [Pseudoephedrine Hcl] Other (See Comments)    seizure    Past Medical History, Surgical history, Social history, and Family History were reviewed and updated.  Review of Systems: All other 10 point review of systems is negative.   Physical Exam:  vitals were not taken for this  visit.  Wt Readings from Last 3 Encounters:  01/29/16 124 lb 12.8 oz (56.609 kg)  01/24/16 125 lb (56.7 kg)  01/18/16 125 lb 14.1 oz (57.1 kg)    Ocular: Sclerae unicteric, pupils equal, round and reactive to light Ear-nose-throat: Oropharynx clear, dentition fair Lymphatic: No cervical  supraclavicular or axillary adenopathy Lungs no rales or rhonchi, good excursion bilaterally Heart regular rate and rhythm, no murmur appreciated Abd soft, nontender, positive bowel sounds, newly liver or spleen tip palpated on exam, no fluid wave MSK no focal spinal tenderness, no joint edema Neuro: non-focal, well-oriented, appropriate affect Breasts: Deferred  Lab Results  Component Value Date   WBC 5.1 01/27/2016   HGB 10.7* 01/27/2016   HCT 33* 01/27/2016   MCV 88.6 01/22/2016   PLT 116* 01/27/2016   Lab Results  Component Value Date   FERRITIN 96 12/26/2015   IRON 40* 12/26/2015   TIBC 215* 12/26/2015   UIBC 175 12/26/2015   IRONPCTSAT 19* 12/26/2015   Lab Results  Component Value Date   RETICCTPCT 1.4 07/25/2015   RBC 3.51* 01/22/2016   RETICCTABS 55.3 07/25/2015   No results found for: KPAFRELGTCHN, LAMBDASER, [facility] Lab Results  Component Value Date   IGA 507* 08/23/2014   No results found for: Odetta Pink, SPEI   Chemistry      Component Value Date/Time   NA 145 01/27/2016   NA 145 01/22/2016 0444   NA 142 11/11/2015 1315   NA 134 09/10/2015 1054   K 3.7 01/27/2016   K 3.5 11/11/2015 1315   K 3.1* 09/10/2015 1054   CL 116* 01/22/2016 0444   CL 100 09/10/2015 1054   CO2 23 01/22/2016 0444   CO2 26 11/11/2015 1315   CO2 26 09/10/2015 1054   BUN 10 01/27/2016   BUN 8 01/22/2016 0444   BUN 10.2 11/11/2015 1315   BUN 7 09/10/2015 1054   CREATININE 0.8 01/27/2016   CREATININE 0.77 01/22/2016 0444   CREATININE 0.8 11/11/2015 1315   CREATININE 0.7 09/10/2015 1054   GLU 88 01/27/2016      Component  Value Date/Time   CALCIUM 8.2* 01/22/2016 0444   CALCIUM 8.7 11/11/2015 1315   CALCIUM 8.2 09/10/2015 1054   ALKPHOS 99 01/27/2016   ALKPHOS 92 11/11/2015 1315   ALKPHOS 100* 09/10/2015 1054   AST 39* 01/27/2016   AST 45* 11/11/2015 1315   AST 34 09/10/2015 1054   ALT 17 01/27/2016   ALT 19 11/11/2015 1315   ALT 13 09/10/2015 1054   BILITOT 0.6 01/22/2016 0444   BILITOT 0.48 11/11/2015 1315   BILITOT 0.80 09/10/2015 1054     Impression and Plan: Ms. Kozloff is a 82 year old female with a history of stage I adenocarcinoma of the right colon with resection in May 2008. So far, there has been no evidence of recurrence.  She was hospitalized last month with metabolic encephalopathy. She is currently taking a magnesium supplement and is staying a skilled nursing facility receiving PT. She is slowly recuperating but still symptomatic with fatigue, chills, palpitations and SOB with exertion.  She has a history of iron deficiency secondary to internal and external hemorrhoids. We will go ahead and give her iron today per both her and her daughter's request as it is hard for them to get her here right now.  She will be going home on Friday and then in 2-3 weeks she will be moving in with her daughter Olegario Shearer. This will be good for her. We will plan to see her back in 1 month for labs and follow-up.  Either she or her family will contact us with any questions or concerns. We can certainly see her sooner if need be.   Eliezer Bottom, NP 5/2/201711:25 AM

## 2016-02-04 NOTE — Progress Notes (Signed)
Patient ID: Monique Fleming, female   DOB: [birth year], 82 y.o.   MRN: [id number]

## 2016-02-04 NOTE — Telephone Encounter (Incomplete)
Past Psychiatric History/Hospitalization(s): Anxiety: {BHH YES OR NO:22294} Bipolar Disorder: {BHH YES OR NO:22294} Depression: {BHH YES OR NO:22294} Mania: {BHH YES OR NO:22294} Psychosis: {BHH YES OR NO:22294} Schizophrenia: {BHH YES OR NO:22294} Personality Disorder: {BHH YES OR NO:22294} Hospitalization for psychiatric illness: {BHH YES OR NO:22294} History of Electroconvulsive Shock Therapy: {BHH YES OR NO:22294} Prior Suicide Attempts: {BHH YES OR XR:[id number]

## 2016-02-04 NOTE — Progress Notes (Signed)
Patient ID: Monique Fleming, female   DOB: 02-Jul-1929, 82 y.o.   MRN: [id number]    DATE:  02/04/2016   MRN:  [id number]  BIRTHDAY: April 09, 1929  Facility:  [facility]ursing Home Location:  [place] Room Number: [id number]  LEVEL OF CARE:  SNF [phone])  Contact Information    Name Relation Home Work Mobile   Galbreath,Betsy Daughter [phone]  [phone]   Casey,Vicki Daughter [phone]  [phone]       Code Status History    Date Active Date Inactive Code Status Order ID Comments User Context   01/18/2016 11:54 PM 01/23/2016  4:54 PM DNR [id number]  Vianne Bulls, MD Inpatient   08/22/2014  5:39 PM 08/24/2014  9:19 PM Full Code [id number]  Bonnielee Haff, MD Inpatient   03/14/2014  6:06 PM 03/15/2014  7:18 PM DNR [id number]  Samuella Cota, MD Inpatient    Questions for Most Recent Historical Code Status (Order [id number])    Question Answer Comment   In the event of cardiac or respiratory ARREST Do not call a "code blue"    In the event of cardiac or respiratory ARREST Do not perform Intubation, CPR, defibrillation or ACLS    In the event of cardiac or respiratory ARREST Use medication by any route, position, wound care, and other measures to relive pain and suffering. May use oxygen, suction and manual treatment of airway obstruction as needed for comfort.     Advance Directive Documentation        Most Recent Value   Type of Advance Directive  Out of facility DNR (pink MOST or yellow form)   Pre-existing out of facility DNR order (yellow form or pink MOST form)     "MOST" Form in Place?         Chief Complaint  Patient presents with  . Acute Visit    Hypomagnesemia    HISTORY OF PRESENT ILLNESS:  This is an 82 year old female who is being seen due to mg 1.2. Patient noted to be in the toilet while in a wheelchair. She is able to propel herself from toilet to the bed. She is verbally responsive. She is currently taking magnesium  supplementation.  She has been admitted to [facility] on 01/23/16 from [facility]. She has PMH of seizure disorder, anxiety, type 2 diabetes mellitus and COPD. She was hospitalized due to acute encephalopathy and failure to thrive. Acute CVA was ruled out with CT head and MRI brain. Infectious etiology was ruled out. EEG findings were consistent with metabolic encephalopathy. She was seen by neurology.  She has been admitted for a short-term rehabilitation.  PAST MEDICAL HISTORY:  Past Medical History  Diagnosis Date  . Aortic stenosis     moderate by echo 6/11 gradient 20/91mmHg for mean and peak  . TIA (transient ischemic attack)     Dr. [name]  . Peripheral vascular disease ([place])   . IBS (irritable bowel syndrome)   . Diabetes mellitus     type II  . Chronic bronchitis   . COPD (chronic obstructive pulmonary disease) ([place])   . Atherosclerosis   . Depression   . Anxiety     Dr Casimiro Needle  . Renal cyst   . Anemia     iron deficiency  . Cellulitis   . Diverticular disease   . Infectious diarrhea(009.2)   . Sinusitis   . Thrush   . Abnormal liver function test   . GERD (gastroesophageal reflux  disease)   . Insomnia     chronic  . Adenocarcinoma ([place]) 2008    colon,s/p right hemicolectomy  . Angina   . Seizures (HCC)     hx of one seizure "  . Cirrhosis of liver (HCC)     hx of  . Clostridium difficile diarrhea   . Thrombocytopenia ([place]) 04/01/2013  . Bursitis     spine per pt  . Macular degeneration 02/18/2014    Follows with [facility]  . Internal hemorrhoids   . Asthma   . Elevated BP 07/09/2015  . Type 2 diabetes mellitus with vascular disease ([place]) 03/02/2007    Chronic    . Encephalopathy, metabolic   . Protein calorie malnutrition ([place])      CURRENT MEDICATIONS: Reviewed  Patient's Medications  New Prescriptions   No medications on file  Previous Medications   ALBUTEROL (PROAIR HFA) 108 (90 BASE) MCG/ACT INHALER    Inhale 2 puffs into the lungs  every 4 (four) hours as needed for wheezing or shortness of breath.   CHOLECALCIFEROL (VITAMIN D3) 50000 UNITS CAPS    Take 1 capsule by mouth once a week. Give on Mondays   CHOLESTYRAMINE (QUESTRAN) 4 G PACKET    MIX 1 PACKET (4 GRAM TOTAL) IN LIQUID AND DRINK DAILY AS NEEDED FOR DIARRHEA   CLONAZEPAM (KLONOPIN) 1 MG TABLET    Take 1 tablet (1 mg total) by mouth 2 (two) times daily as needed for anxiety.   CLOPIDOGREL (PLAVIX) 75 MG TABLET    TAKE 1 TABLET DAILY   DESVENLAFAXINE (PRISTIQ) 100 MG 24 HR TABLET    Take 100 mg by mouth daily.   FLUTICASONE (FLONASE) 50 MCG/ACT NASAL SPRAY    USE 2 SPRAYS IN EACH NOSTRIL DAILY AS NEEDED FOR ALLERGIES   FORMOTEROL (PERFOROMIST) 20 MCG/2ML NEBULIZER SOLUTION    Take 2 mLs (20 mcg total) by nebulization 2 (two) times daily.   HYDROCORTISONE (ANUSOL-HC) 25 MG SUPPOSITORY    Place 25 mg rectally daily as needed.   MAGNESIUM OXIDE (MAG-OX) 400 MG TABLET    Take 400 mg by mouth 2 (two) times daily. Take 400 mg BID x4 days, then qd.   METFORMIN (GLUCOPHAGE) 500 MG TABLET    TAKE ONE-HALF (1/2) TO ONE TABLET DAILY WITH BREAKFAST   MONTELUKAST (SINGULAIR) 10 MG TABLET    Take 10 mg by mouth at bedtime.   MULTIPLE VITAMINS-MINERALS (OCUVITE PRESERVISION) TABS    Take by mouth 2 (two) times daily.   ONDANSETRON (ZOFRAN) 4 MG TABLET    Take 1 tablet (4 mg total) by mouth every 6 (six) hours as needed for nausea or vomiting.   PANTOPRAZOLE (PROTONIX) 40 MG TABLET    Take 40 mg by mouth daily.   PHENYTOIN (DILANTIN) 100 MG ER CAPSULE    Take 200 mg by mouth at bedtime.   POLYETHYL GLYCOL-PROPYL GLYCOL (SYSTANE) 0.4-0.3 % SOLN    Place 1 drop into both eyes 2 (two) times daily as needed (dry eyes).    POTASSIUM CHLORIDE (K-DUR) 10 MEQ TABLET    Take 10 mEq by mouth 2 (two) times daily.   PROTEIN (PROCEL) POWD    Take 2 scoop by mouth 2 (two) times daily.   SACCHAROMYCES BOULARDII (FLORASTOR) 250 MG CAPSULE    Take 250 mg by mouth daily.   SODIUM CHLORIDE (OCEAN)  0.65 % SOLN NASAL SPRAY    Place 2 sprays into both nostrils daily as needed for congestion.    TRAMADOL (  ULTRAM) 50 MG TABLET    Take 1 tablet (50 mg total) by mouth every 12 (twelve) hours as needed for severe pain.   UNABLE TO FIND    Med Name: Med pass 120 mL 3 times daily between meals for nutritional supplement  Modified Medications   No medications on file  Discontinued Medications   MAGNESIUM OXIDE (MAG-OX) 400 (241.3 MG) MG TABLET    Take 1 tablet (400 mg total) by mouth daily.     Allergies  Allergen Reactions  . Bactrim [Sulfamethoxazole-Trimethoprim] Shortness Of Breath  . Levofloxacin Other (See Comments)    seizure  . Pneumovax [Pneumococcal Polysaccharide Vaccine] Other (See Comments)    Severe allergy-cellulitis  . Amoxicillin-Pot Clavulanate Other (See Comments)    Patient stated Drs. recommend taking Augmentin due to her PCN allergy.  . Penicillins Rash    Has patient had a PCN reaction causing immediate rash, facial/tongue/throat swelling, SOB or lightheadedness with hypotension: No Has patient had a PCN reaction causing severe rash involving mucus membranes or skin necrosis: No Has patient had a PCN reaction that required hospitalization No Has patient had a PCN reaction occurring within the last 10 years: No If all of the above answers are "NO", then may proceed with Cephalosporin use.   . Bupropion   . Haldol [Haloperidol Lactate]     Had unknown reaction many yrs ago per pt  . Haloperidol   . Morphine   . Morphine And Related     Confused and agitation  . Pneumococcal Vaccine   . Pseudoephedrine Hcl   . Sudafed [Pseudoephedrine Hcl] Other (See Comments)    seizure     REVIEW OF SYSTEMS:  GENERAL: no change in appetite, no fatigue, no weight changes, no fever, chills or weakness SKIN: Denies rash, itching, wounds, ulcer sores, or nail abnormality EYES: Denies change in vision, dry eyes, eye pain, itching or discharge EARS: Denies change in hearing,  ringing in ears, or earache NOSE: Denies nasal congestion or epistaxis MOUTH and THROAT: Denies oral discomfort, gingival pain or bleeding, pain from teeth or hoarseness   RESPIRATORY: no cough, SOB, DOE, wheezing, hemoptysis CARDIAC: no chest pain, edema or palpitations GI: no abdominal pain, diarrhea, constipation, heart burn, nausea or vomiting GU: Denies dysuria, frequency, hematuria, incontinence, or discharge PSYCHIATRIC: Denies feeling of depression or anxiety. No report of hallucinations, insomnia, paranoia, or agitation   PHYSICAL EXAMINATION  GENERAL APPEARANCE: Well nourished. In no acute distress. Normal body habitus SKIN:  Skin is warm and dry.  HEAD: Normal in size and contour. No evidence of trauma EYES: Lids open and close normally. No blepharitis, entropion or ectropion. PERRL. Conjunctivae are clear and sclerae are white. Lenses are without opacity EARS: Pinnae are normal. Patient hears normal voice tunes of the examiner MOUTH and THROAT: Lips are without lesions. Oral mucosa is moist and without lesions. Tongue is normal in shape, size, and color and without lesions NECK: supple, trachea midline, no neck masses, no thyroid tenderness, no thyromegaly LYMPHATICS: no LAN in the neck, no supraclavicular LAN RESPIRATORY: breathing is even & unlabored, BS CTAB CARDIAC: RRR, no murmur,no extra heart sounds, no edema GI: abdomen soft, normal BS, no masses, no tenderness, no hepatomegaly, no splenomegaly EXTREMITIES:  Able to move X 4 extremities PSYCHIATRIC: Alert and oriented X 3. Affect and behavior are appropriate  LABS/RADIOLOGY: Labs reviewed: Basic Metabolic Panel:  Recent Labs  01/18/16 1655  01/20/16 1059 01/21/16 0445  01/22/16 0444 01/27/16 02/04/16 1114  NA 140  --  141  --   < > 145 145 143  K 4.4  --  4.1  --   --  3.6 3.7 4.3  CL 104  --  111  --   --  116*  --   --   CO2 27  --  25  --   --  23  --  26  GLUCOSE 165*  --  90  --   --  188*  --  125   BUN 11  --  11  --   < > 8 10 16.4  CREATININE 0.81  --  0.94  --   < > 0.77 0.8 1.2*  CALCIUM 8.6*  --  8.0*  --   --  8.2*  --  9.3  MG  --   < > 1.4* 1.5*  --  1.7  --   --   < > = values in this interval not displayed. Liver Function Tests:  Recent Labs  01/20/16 1059 01/22/16 0444 01/27/16 02/04/16 1114  AST 46* 36 39* 55*  ALT 17 14 17  33  ALKPHOS 92 76 99 101  BILITOT 0.9 0.6  --  0.50  PROT 5.9* 5.7*  --  6.8  ALBUMIN 3.0* 2.7*  --  3.2*   No results for input(s): LIPASE, AMYLASE in the last 8760 hours. No results for input(s): AMMONIA in the last 8760 hours. CBC:  Recent Labs  01/21/16 0445  01/22/16 0444 01/27/16 02/03/16 02/04/16 1114  WBC 7.3  < > 6.6 5.1 7.4 6.8  NEUTROABS  --   --   --  3 5 4.7  HGB 10.3*  --  10.2* 10.7* 11.1* 11.2*  HCT 31.3*  --  31.1* 33* 35* 33.6*  MCV 87.4  --  88.6  --   --  91  PLT 114*  --  114* 116* 144* 142*  < > = values in this interval not displayed. A1C: Invalid input(s): A1C Lipid Panel:  Recent Labs  07/25/15 0820 01/19/16 0524  HDL 24* 26*   Cardiac Enzymes:  Recent Labs  05/25/15 1845 01/18/16 1655  TROPONINI 0.03 0.03   CBG:  Recent Labs  01/23/16 0752 01/23/16 0820 01/23/16 1210  GLUCAP 123* 124* 117*      Dg Chest 2 View  01/18/2016  CLINICAL DATA:  Generalized weakness and cough EXAM: CHEST  2 VIEW COMPARISON:  11/16/2015 chest radiograph. FINDINGS: Stable cardiomediastinal silhouette with normal heart size. No pneumothorax. No pleural effusion. Emphysema. No pulmonary edema. No acute consolidative airspace disease. IMPRESSION: Emphysema.  Otherwise no active disease in the chest. Electronically Signed   By: Ilona Sorrel M.D.   On: 01/18/2016 17:29   Ct Head Wo Contrast  01/18/2016  CLINICAL DATA:  Progressive weakness and altered mental status. EXAM: CT HEAD WITHOUT CONTRAST TECHNIQUE: Contiguous axial images were obtained from the base of the skull through the vertex without intravenous  contrast. COMPARISON:  05/25/2015 FINDINGS: There is no intracranial hemorrhage, mass or evidence of acute infarction. There is no extra-axial fluid collection. There is moderate generalized atrophy. There is white matter hypodensity consistent with small vessel ischemic disease. Chronic lacunar infarct in the right cerebellar hemisphere. Probable additional chronic lacunar infarct in the right putamen. No acute findings are evident. No interval change is evident. There is no bony abnormality. Visible paranasal sinuses are clear. Visible orbits are unremarkable. IMPRESSION: No acute intracranial findings. There is moderate generalized atrophy and small vessel ischemic changes. Electronically Signed  By: Andreas Newport M.D.   On: 01/18/2016 21:34   Mr Brain Wo Contrast  01/19/2016  CLINICAL DATA:  Lethargy with slurred speech. Impaired gait. Symptoms for 24 hours. Seizure disorder. Diabetes. Hypertension. Anxiety, COPD. Cirrhosis. EXAM: MRI HEAD WITHOUT CONTRAST MRA HEAD WITHOUT CONTRAST TECHNIQUE: Multiplanar, multiecho pulse sequences of the brain and surrounding structures were obtained without intravenous contrast. Angiographic images of the head were obtained using MRA technique without contrast. COMPARISON:  CT head 01/18/2016. FINDINGS: MRI HEAD FINDINGS The patient was unable to remain motionless for the exam. Small or subtle lesions could be overlooked. No evidence for acute infarction, hemorrhage, mass lesion, hydrocephalus, or extra-axial fluid. Global atrophy. Extensive white matter disease. Chronic LEFT occipital infarct. Chronic RIGHT cerebellar infarct. Flow voids are maintained. Widespread areas of chronic hemorrhage throughout the cerebral hemispheres, cerebellum, and deep nuclei. No appreciable T1 shortening of any of the lesions. Findings most consistent with chronic microbleeds from longstanding hypertensive cerebrovascular disease. Cerebral amyloid angiopathy, chronic coagulopathy, or  multiple cavernomas are less favored. No midline abnormality. Extracranial soft tissues unremarkable. BILATERAL cataract extraction. MRA HEAD FINDINGS Motion degraded exam. Internal carotid arteries widely patent. Basilar artery widely patent with vertebrals codominant. No intracranial stenosis or aneurysm. IMPRESSION: No acute stroke is evident.  Atrophy with small vessel disease. Widespread areas of chronic hemorrhage throughout the brain, favored to represent sequelae of longstanding hypertensive cerebrovascular disease. No proximal large vessel stenosis or occlusion. Electronically Signed   By: Staci Righter M.D.   On: 01/19/2016 13:03   Mr Jodene Nam Head/brain Wo Cm  01/19/2016  CLINICAL DATA:  Lethargy with slurred speech. Impaired gait. Symptoms for 24 hours. Seizure disorder. Diabetes. Hypertension. Anxiety, COPD. Cirrhosis. EXAM: MRI HEAD WITHOUT CONTRAST MRA HEAD WITHOUT CONTRAST TECHNIQUE: Multiplanar, multiecho pulse sequences of the brain and surrounding structures were obtained without intravenous contrast. Angiographic images of the head were obtained using MRA technique without contrast. COMPARISON:  CT head 01/18/2016. FINDINGS: MRI HEAD FINDINGS The patient was unable to remain motionless for the exam. Small or subtle lesions could be overlooked. No evidence for acute infarction, hemorrhage, mass lesion, hydrocephalus, or extra-axial fluid. Global atrophy. Extensive white matter disease. Chronic LEFT occipital infarct. Chronic RIGHT cerebellar infarct. Flow voids are maintained. Widespread areas of chronic hemorrhage throughout the cerebral hemispheres, cerebellum, and deep nuclei. No appreciable T1 shortening of any of the lesions. Findings most consistent with chronic microbleeds from longstanding hypertensive cerebrovascular disease. Cerebral amyloid angiopathy, chronic coagulopathy, or multiple cavernomas are less favored. No midline abnormality. Extracranial soft tissues unremarkable. BILATERAL  cataract extraction. MRA HEAD FINDINGS Motion degraded exam. Internal carotid arteries widely patent. Basilar artery widely patent with vertebrals codominant. No intracranial stenosis or aneurysm. IMPRESSION: No acute stroke is evident.  Atrophy with small vessel disease. Widespread areas of chronic hemorrhage throughout the brain, favored to represent sequelae of longstanding hypertensive cerebrovascular disease. No proximal large vessel stenosis or occlusion. Electronically Signed   By: Staci Righter M.D.   On: 01/19/2016 13:03    ASSESSMENT/PLAN:  Hypomagnesemia - Increase Magnesium 400 mg 1 tab from daily to BID X 4 days then daily; check Mg level on 02/06/16    [facility], NP [place]

## 2016-02-05 LAB — RETICULOCYTES: Reticulocyte Count: 3.1 % — ABNORMAL HIGH (ref 0.6–2.6)

## 2016-02-05 LAB — ERYTHROPOIETIN: Erythropoietin: 18.1 m[IU]/mL (ref 2.6–18.5)

## 2016-02-06 ENCOUNTER — Encounter: Payer: Self-pay | Admitting: Family Medicine

## 2016-02-06 ENCOUNTER — Non-Acute Institutional Stay (SKILLED_NURSING_FACILITY): Payer: Medicare Other | Admitting: Adult Health

## 2016-02-06 ENCOUNTER — Encounter: Payer: Self-pay | Admitting: Adult Health

## 2016-02-06 DIAGNOSIS — R531 Weakness: Secondary | ICD-10-CM | POA: Diagnosis not present

## 2016-02-06 DIAGNOSIS — F329 Major depressive disorder, single episode, unspecified: Secondary | ICD-10-CM

## 2016-02-06 DIAGNOSIS — E1159 Type 2 diabetes mellitus with other circulatory complications: Secondary | ICD-10-CM | POA: Diagnosis not present

## 2016-02-06 DIAGNOSIS — D638 Anemia in other chronic diseases classified elsewhere: Secondary | ICD-10-CM

## 2016-02-06 DIAGNOSIS — E46 Unspecified protein-calorie malnutrition: Secondary | ICD-10-CM | POA: Diagnosis not present

## 2016-02-06 DIAGNOSIS — F419 Anxiety disorder, unspecified: Secondary | ICD-10-CM | POA: Diagnosis not present

## 2016-02-06 DIAGNOSIS — K219 Gastro-esophageal reflux disease without esophagitis: Secondary | ICD-10-CM | POA: Diagnosis not present

## 2016-02-06 DIAGNOSIS — I739 Peripheral vascular disease, unspecified: Secondary | ICD-10-CM

## 2016-02-06 DIAGNOSIS — I10 Essential (primary) hypertension: Secondary | ICD-10-CM

## 2016-02-06 DIAGNOSIS — J449 Chronic obstructive pulmonary disease, unspecified: Secondary | ICD-10-CM

## 2016-02-06 DIAGNOSIS — D696 Thrombocytopenia, unspecified: Secondary | ICD-10-CM | POA: Diagnosis not present

## 2016-02-06 DIAGNOSIS — R569 Unspecified convulsions: Secondary | ICD-10-CM | POA: Diagnosis not present

## 2016-02-06 DIAGNOSIS — F32A Depression, unspecified: Secondary | ICD-10-CM

## 2016-02-06 NOTE — Progress Notes (Signed)
Patient ID: Monique Fleming, female   DOB: 31-May-1929, 82 y.o.   MRN: [id number]    DATE:  02/06/2016   MRN:  [id number]  BIRTHDAY: Dec 11, 1928  Facility:  Nursing Home Location:  [place] Room Number: [id number]  LEVEL OF CARE:  SNF [phone])  Contact Information    Name Relation Home Work Mobile   Lac,Betsy Daughter [phone]  [phone]   Casey,Vicki Daughter [phone]  [phone]       Code Status History    Date Active Date Inactive Code Status Order ID Comments User Context   01/18/2016 11:54 PM 01/23/2016  4:54 PM DNR [id number]  Vianne Bulls, MD Inpatient   08/22/2014  5:39 PM 08/24/2014  9:19 PM Full Code [id number]  Bonnielee Haff, MD Inpatient   03/14/2014  6:06 PM 03/15/2014  7:18 PM DNR [id number]  Samuella Cota, MD Inpatient    Questions for Most Recent Historical Code Status (Order [id number])    Question Answer Comment   In the event of cardiac or respiratory ARREST Do not call a "code blue"    In the event of cardiac or respiratory ARREST Do not perform Intubation, CPR, defibrillation or ACLS    In the event of cardiac or respiratory ARREST Use medication by any route, position, wound care, and other measures to relive pain and suffering. May use oxygen, suction and manual treatment of airway obstruction as needed for comfort.     Advance Directive Documentation        Most Recent Value   Type of Advance Directive  Out of facility DNR (pink MOST or yellow form)   Pre-existing out of facility DNR order (yellow form or pink MOST form)     "MOST" Form in Place?         Chief Complaint  Patient presents with  . Discharge Note    HISTORY OF PRESENT ILLNESS:  This is an 82 year old female who is for discharge home with Home health PT for endurance, OT for ADLs, CNA for showers and skilled Nurse for disease management. DME:  3-in-1 bedside commode and rollator.  She has been admitted to [facility] on 01/23/16 from [facility]. She has PMH of seizure disorder, anxiety, type 2 diabetes mellitus and COPD. She was hospitalized due to acute encephalopathy and failure to thrive. Acute CVA was ruled out with CT head and MRI brain. Infectious etiology was ruled out. EEG findings were consistent with metabolic encephalopathy. She was seen by neurology.  Patient was admitted to this facility for short-term rehabilitation after the patient's recent hospitalization.  Patient has completed SNF rehabilitation and therapy has cleared the patient for discharge.   PAST MEDICAL HISTORY:  Past Medical History  Diagnosis Date  . Aortic stenosis     moderate by echo 6/11 gradient 20/46mmHg for mean and peak  . TIA (transient ischemic attack)     Dr. [name]  . Peripheral vascular disease ([place])   . IBS (irritable bowel syndrome)   . Diabetes mellitus     type II  . Chronic bronchitis   . COPD (chronic obstructive pulmonary disease) ([place])   . Atherosclerosis   . Depression   . Anxiety     Dr Casimiro Needle  . Renal cyst   . Anemia     iron deficiency  . Cellulitis   . Diverticular disease   . Infectious diarrhea(009.2)   . Sinusitis   . Thrush   . Abnormal liver function  test   . GERD (gastroesophageal reflux disease)   . Insomnia     chronic  . Adenocarcinoma ([place]) 2008    colon,s/p right hemicolectomy  . Angina   . Seizures (HCC)     hx of one seizure "  . Cirrhosis of liver (HCC)     hx of  . Clostridium difficile diarrhea   . Thrombocytopenia ([place]) 04/01/2013  . Bursitis     spine per pt  . Macular degeneration 02/18/2014    Follows with [facility]  . Internal hemorrhoids   . Asthma   . Elevated BP 07/09/2015  . Type 2 diabetes mellitus with vascular disease ([place]) 03/02/2007    Chronic    . Encephalopathy, metabolic   . Protein calorie malnutrition ([place])      CURRENT MEDICATIONS: Reviewed  Patient's Medications  New Prescriptions   No medications on file  Previous Medications   ALBUTEROL (PROAIR HFA)  108 (90 BASE) MCG/ACT INHALER    Inhale 2 puffs into the lungs every 4 (four) hours as needed for wheezing or shortness of breath.   CHOLECALCIFEROL (VITAMIN D3) 50000 UNITS CAPS    Take 1 capsule by mouth once a week. Give on Mondays   CHOLESTYRAMINE (QUESTRAN) 4 G PACKET    MIX 1 PACKET (4 GRAM TOTAL) IN LIQUID AND DRINK DAILY AS NEEDED FOR DIARRHEA   CLONAZEPAM (KLONOPIN) 1 MG TABLET    Take 1 tablet (1 mg total) by mouth 2 (two) times daily as needed for anxiety.   CLOPIDOGREL (PLAVIX) 75 MG TABLET    TAKE 1 TABLET DAILY   DESVENLAFAXINE (PRISTIQ) 100 MG 24 HR TABLET    Take 100 mg by mouth daily.   FLUTICASONE (FLONASE) 50 MCG/ACT NASAL SPRAY    USE 2 SPRAYS IN EACH NOSTRIL DAILY AS NEEDED FOR ALLERGIES   FORMOTEROL (PERFOROMIST) 20 MCG/2ML NEBULIZER SOLUTION    Take 2 mLs (20 mcg total) by nebulization 2 (two) times daily.   HYDROCORTISONE (ANUSOL-HC) 25 MG SUPPOSITORY    Place 25 mg rectally daily as needed.   MAGNESIUM OXIDE (MAG-OX) 400 MG TABLET    Take 400 mg by mouth 2 (two) times daily. Take 400 mg BID x4 days, then qd.   MONTELUKAST (SINGULAIR) 10 MG TABLET    Take 10 mg by mouth at bedtime.   MULTIPLE VITAMINS-MINERALS (OCUVITE PRESERVISION) TABS    Take by mouth 2 (two) times daily.   ONDANSETRON (ZOFRAN) 4 MG TABLET    Take 1 tablet (4 mg total) by mouth every 6 (six) hours as needed for nausea or vomiting.   PANTOPRAZOLE (PROTONIX) 40 MG TABLET    Take 40 mg by mouth daily.   PHENYTOIN (DILANTIN) 100 MG ER CAPSULE    Take 200 mg by mouth at bedtime.   POLYETHYL GLYCOL-PROPYL GLYCOL (SYSTANE) 0.4-0.3 % SOLN    Place 1 drop into both eyes 2 (two) times daily as needed (dry eyes).    POTASSIUM CHLORIDE (K-DUR) 10 MEQ TABLET    Take 10 mEq by mouth 2 (two) times daily.   PROTEIN (PROCEL) POWD    Take 2 scoop by mouth 2 (two) times daily.   SACCHAROMYCES BOULARDII (FLORASTOR) 250 MG CAPSULE    Take 250 mg by mouth daily.   SODIUM CHLORIDE (OCEAN) 0.65 % SOLN NASAL SPRAY    Place 2  sprays into both nostrils daily as needed for congestion.    TRAMADOL (ULTRAM) 50 MG TABLET    Take 1 tablet (50 mg  total) by mouth every 12 (twelve) hours as needed for severe pain.   UNABLE TO FIND    Med Name: Med pass 120 mL 3 times daily between meals for nutritional supplement  Modified Medications   No medications on file  Discontinued Medications   METFORMIN (GLUCOPHAGE) 500 MG TABLET    TAKE ONE-HALF (1/2) TO ONE TABLET DAILY WITH BREAKFAST     Allergies  Allergen Reactions  . Bactrim [Sulfamethoxazole-Trimethoprim] Shortness Of Breath  . Levofloxacin Other (See Comments)    seizure  . Pneumovax [Pneumococcal Polysaccharide Vaccine] Other (See Comments)    Severe allergy-cellulitis  . Amoxicillin-Pot Clavulanate Other (See Comments)    Patient stated Drs. recommend taking Augmentin due to her PCN allergy.  . Penicillins Rash    Has patient had a PCN reaction causing immediate rash, facial/tongue/throat swelling, SOB or lightheadedness with hypotension: No Has patient had a PCN reaction causing severe rash involving mucus membranes or skin necrosis: No Has patient had a PCN reaction that required hospitalization No Has patient had a PCN reaction occurring within the last 10 years: No If all of the above answers are "NO", then may proceed with Cephalosporin use.   . Bupropion   . Haldol [Haloperidol Lactate]     Had unknown reaction many yrs ago per pt  . Haloperidol   . Morphine   . Morphine And Related     Confused and agitation  . Pneumococcal Vaccine   . Pseudoephedrine Hcl   . Sudafed [Pseudoephedrine Hcl] Other (See Comments)    seizure     REVIEW OF SYSTEMS:  GENERAL: no change in appetite, no fatigue, no weight changes, no fever, chills or weakness SKIN: Denies rash, itching, wounds, ulcer sores, or nail abnormality EYES: Denies change in vision, dry eyes, eye pain, itching or discharge EARS: Denies change in hearing, ringing in ears, or earache NOSE:  Denies nasal congestion or epistaxis MOUTH and THROAT: Denies oral discomfort, gingival pain or bleeding, pain from teeth or hoarseness   RESPIRATORY: no cough, SOB, DOE, wheezing, hemoptysis CARDIAC: no chest pain, edema or palpitations GI: no abdominal pain, diarrhea, constipation, heart burn, nausea or vomiting GU: Denies dysuria, frequency, hematuria, incontinence, or discharge PSYCHIATRIC: Denies feeling of depression or anxiety. No report of hallucinations, insomnia, paranoia, or agitation   PHYSICAL EXAMINATION  GENERAL APPEARANCE: Well nourished. In no acute distress. Normal body habitus SKIN:  Skin is warm and dry.  HEAD: Normal in size and contour. No evidence of trauma EYES: Lids open and close normally. No blepharitis, entropion or ectropion. PERRL. Conjunctivae are clear and sclerae are white. Lenses are without opacity EARS: Pinnae are normal. Patient hears normal voice tunes of the examiner MOUTH and THROAT: Lips are without lesions. Oral mucosa is moist and without lesions. Tongue is normal in shape, size, and color and without lesions NECK: supple, trachea midline, no neck masses, no thyroid tenderness, no thyromegaly LYMPHATICS: no LAN in the neck, no supraclavicular LAN RESPIRATORY: breathing is even & unlabored, BS CTAB CARDIAC: RRR, +murmur,no extra heart sounds, no edema GI: abdomen soft, normal BS, no masses, no tenderness, no hepatomegaly, no splenomegaly EXTREMITIES:  Able to move X 4 extremities PSYCHIATRIC: Alert and oriented X 3. Affect and behavior are appropriate  LABS/RADIOLOGY: Labs reviewed: Basic Metabolic Panel:  Recent Labs  01/18/16 1655  01/20/16 1059 01/21/16 0445  01/22/16 0444 01/27/16 02/04/16 1114  NA 140  --  141  --   < > 145 145 143  K 4.4  --  4.1  --   --  3.6 3.7 4.3  CL 104  --  111  --   --  116*  --   --   CO2 27  --  25  --   --  23  --  26  GLUCOSE 165*  --  90  --   --  188*  --  125  BUN 11  --  11  --   < > 8 10 16.4   CREATININE 0.81  --  0.94  --   < > 0.77 0.8 1.2*  CALCIUM 8.6*  --  8.0*  --   --  8.2*  --  9.3  MG  --   < > 1.4* 1.5*  --  1.7  --   --   < > = values in this interval not displayed. Liver Function Tests:  Recent Labs  01/20/16 1059 01/22/16 0444 01/27/16 02/04/16 1114  AST 46* 36 39* 55*  ALT 17 14 17  33  ALKPHOS 92 76 99 101  BILITOT 0.9 0.6  --  0.50  PROT 5.9* 5.7*  --  6.8  ALBUMIN 3.0* 2.7*  --  3.2*   CBC:  Recent Labs  01/21/16 0445  01/22/16 0444 01/27/16 02/03/16 02/04/16 1114  WBC 7.3  < > 6.6 5.1 7.4 6.8  NEUTROABS  --   --   --  3 5 4.7  HGB 10.3*  --  10.2* 10.7* 11.1* 11.2*  HCT 31.3*  --  31.1* 33* 35* 33.6*  MCV 87.4  --  88.6  --   --  91  PLT 114*  --  114* 116* 144* 142*  < > = values in this interval not displayed.  Lipid Panel:  Recent Labs  07/25/15 0820 01/19/16 0524  HDL 24* 26*   Cardiac Enzymes:  Recent Labs  05/25/15 1845 01/18/16 1655  TROPONINI 0.03 0.03   CBG:  Recent Labs  01/23/16 0752 01/23/16 0820 01/23/16 1210  GLUCAP 123* 124* 117*      Dg Chest 2 View  01/18/2016  CLINICAL DATA:  Generalized weakness and cough EXAM: CHEST  2 VIEW COMPARISON:  11/16/2015 chest radiograph. FINDINGS: Stable cardiomediastinal silhouette with normal heart size. No pneumothorax. No pleural effusion. Emphysema. No pulmonary edema. No acute consolidative airspace disease. IMPRESSION: Emphysema.  Otherwise no active disease in the chest. Electronically Signed   By: Ilona Sorrel M.D.   On: 01/18/2016 17:29   Ct Head Wo Contrast  01/18/2016  CLINICAL DATA:  Progressive weakness and altered mental status. EXAM: CT HEAD WITHOUT CONTRAST TECHNIQUE: Contiguous axial images were obtained from the base of the skull through the vertex without intravenous contrast. COMPARISON:  05/25/2015 FINDINGS: There is no intracranial hemorrhage, mass or evidence of acute infarction. There is no extra-axial fluid collection. There is moderate generalized  atrophy. There is white matter hypodensity consistent with small vessel ischemic disease. Chronic lacunar infarct in the right cerebellar hemisphere. Probable additional chronic lacunar infarct in the right putamen. No acute findings are evident. No interval change is evident. There is no bony abnormality. Visible paranasal sinuses are clear. Visible orbits are unremarkable. IMPRESSION: No acute intracranial findings. There is moderate generalized atrophy and small vessel ischemic changes. Electronically Signed   By: Andreas Newport M.D.   On: 01/18/2016 21:34   Mr Brain Wo Contrast  01/19/2016  CLINICAL DATA:  Lethargy with slurred speech. Impaired gait. Symptoms for 24 hours. Seizure disorder. Diabetes. Hypertension. Anxiety, COPD. Cirrhosis. EXAM:  MRI HEAD WITHOUT CONTRAST MRA HEAD WITHOUT CONTRAST TECHNIQUE: Multiplanar, multiecho pulse sequences of the brain and surrounding structures were obtained without intravenous contrast. Angiographic images of the head were obtained using MRA technique without contrast. COMPARISON:  CT head 01/18/2016. FINDINGS: MRI HEAD FINDINGS The patient was unable to remain motionless for the exam. Small or subtle lesions could be overlooked. No evidence for acute infarction, hemorrhage, mass lesion, hydrocephalus, or extra-axial fluid. Global atrophy. Extensive white matter disease. Chronic LEFT occipital infarct. Chronic RIGHT cerebellar infarct. Flow voids are maintained. Widespread areas of chronic hemorrhage throughout the cerebral hemispheres, cerebellum, and deep nuclei. No appreciable T1 shortening of any of the lesions. Findings most consistent with chronic microbleeds from longstanding hypertensive cerebrovascular disease. Cerebral amyloid angiopathy, chronic coagulopathy, or multiple cavernomas are less favored. No midline abnormality. Extracranial soft tissues unremarkable. BILATERAL cataract extraction. MRA HEAD FINDINGS Motion degraded exam. Internal carotid  arteries widely patent. Basilar artery widely patent with vertebrals codominant. No intracranial stenosis or aneurysm. IMPRESSION: No acute stroke is evident.  Atrophy with small vessel disease. Widespread areas of chronic hemorrhage throughout the brain, favored to represent sequelae of longstanding hypertensive cerebrovascular disease. No proximal large vessel stenosis or occlusion. Electronically Signed   By: Staci Righter M.D.   On: 01/19/2016 13:03   Mr Jodene Nam Head/brain Wo Cm  01/19/2016  CLINICAL DATA:  Lethargy with slurred speech. Impaired gait. Symptoms for 24 hours. Seizure disorder. Diabetes. Hypertension. Anxiety, COPD. Cirrhosis. EXAM: MRI HEAD WITHOUT CONTRAST MRA HEAD WITHOUT CONTRAST TECHNIQUE: Multiplanar, multiecho pulse sequences of the brain and surrounding structures were obtained without intravenous contrast. Angiographic images of the head were obtained using MRA technique without contrast. COMPARISON:  CT head 01/18/2016. FINDINGS: MRI HEAD FINDINGS The patient was unable to remain motionless for the exam. Small or subtle lesions could be overlooked. No evidence for acute infarction, hemorrhage, mass lesion, hydrocephalus, or extra-axial fluid. Global atrophy. Extensive white matter disease. Chronic LEFT occipital infarct. Chronic RIGHT cerebellar infarct. Flow voids are maintained. Widespread areas of chronic hemorrhage throughout the cerebral hemispheres, cerebellum, and deep nuclei. No appreciable T1 shortening of any of the lesions. Findings most consistent with chronic microbleeds from longstanding hypertensive cerebrovascular disease. Cerebral amyloid angiopathy, chronic coagulopathy, or multiple cavernomas are less favored. No midline abnormality. Extracranial soft tissues unremarkable. BILATERAL cataract extraction. MRA HEAD FINDINGS Motion degraded exam. Internal carotid arteries widely patent. Basilar artery widely patent with vertebrals codominant. No intracranial stenosis or  aneurysm. IMPRESSION: No acute stroke is evident.  Atrophy with small vessel disease. Widespread areas of chronic hemorrhage throughout the brain, favored to represent sequelae of longstanding hypertensive cerebrovascular disease. No proximal large vessel stenosis or occlusion. Electronically Signed   By: Staci Righter M.D.   On: 01/19/2016 13:03    ASSESSMENT/PLAN:  Generalized weakness - for home health PT, OT, CNA and skilled Nursing  Anemia of chronic disease - hgb 11.2; had a recent iron infusion @ hematology per daughter  Thrombocytopenia - platelet 144; improved from 116 94/24/17)  Hypertension - continue Norvasc 5 mg daily  Protein calorie malnutrition - continue Procel 2 scoops by mouth twice a day  Anxiety - mood this is stable; continue Klonopin 1 mg 1 tab by mouth twice a day when necessary  Depression - continue Pristiq ER 100 mg 1 tab by mouth daily  Diabetes mellitus, type II with vascular disease -hemoglobin A1c 5.3; metformin was discontinued  COPD - no SOB; continue Monetelukast and bronchodilators  GERD - continue Protonix 40 mg by  mouth daily  Seizure disorder - continue Dilantin 100 mg take 2 capsules = 200 mg by mouth daily at bedtime  PVD - continue Plavix 75 mg daily   Hypomagnesemia - continue Magnesium 400 mg 1 tab from daily      I have filled out patient's discharge paperwork and written prescriptions.  Patient will receive home health PT, OT, Skilled Nursing and CNA.  DME provided:  3 in 1 bedside commode and Rollator  Total discharge time: Greater than 30 minutes  Discharge time involved coordination of the discharge process with social worker, nursing staff and therapy department. Medical justification for home health services/DME verified.    [facility], NP [employer] [phone]

## 2016-02-10 ENCOUNTER — Telehealth: Payer: Self-pay | Admitting: Family Medicine

## 2016-02-10 ENCOUNTER — Encounter: Payer: Self-pay | Admitting: Family

## 2016-02-10 ENCOUNTER — Encounter: Payer: Self-pay | Admitting: Family Medicine

## 2016-02-10 DIAGNOSIS — R790 Abnormal level of blood mineral: Secondary | ICD-10-CM

## 2016-02-10 NOTE — Telephone Encounter (Signed)
Ok to give verbal order. Have them fax written order for Dr. Charlett Blake to review and sign

## 2016-02-10 NOTE — Telephone Encounter (Signed)
Caller name:Susan Relationship to patient: [facility] Can be reached: [phone]    Reason for call: Patient was admitted to [facility] services on Saturday. Request orders for Skilled Nursing Visits. 1 week 1 and 2 weeks 3. Also needs order for Speech Evaluation

## 2016-02-10 NOTE — Telephone Encounter (Signed)
[facility] called to inform moving through the process the patient was considered a non-admit since coming out of a skilled nursing home.

## 2016-02-10 NOTE — Telephone Encounter (Signed)
Please review if ok and can give a verbal if ok.

## 2016-02-10 NOTE — Telephone Encounter (Signed)
HHRN informed of verbal ok and to fax over order as well.  HHRN agreed to all.

## 2016-02-10 NOTE — Telephone Encounter (Signed)
Put in mg level to be done today

## 2016-02-11 ENCOUNTER — Telehealth: Payer: Self-pay | Admitting: Neurology

## 2016-02-11 ENCOUNTER — Ambulatory Visit: Payer: Medicare Other | Admitting: Neurology

## 2016-02-11 ENCOUNTER — Telehealth: Payer: Self-pay | Admitting: Family Medicine

## 2016-02-11 NOTE — Telephone Encounter (Signed)
[name]/[facility] [phone] called to inquire if lab results that were faxed to our office today 2:45pm were received. Verbal results are as follows, Magnesium on 02/03/16 1.2, 02/07/16 1.4 patient is on Magnesium 400mg  po q day.

## 2016-02-11 NOTE — Telephone Encounter (Signed)
Rn call [name] back at [facility] about patients lab work. Pts lab work for [name] was fax here. Rn told liz that Dr.Sethi does not monitor electrolytes, and it should be directed to her PCP/. [name] also stated that pts daughter insisted on doing the lab work. Rn explain pts appt had to be r/s because Dr. [name] was out. Message will be sent to Dr.Sethi as a FYI. Monique Fleming

## 2016-02-11 NOTE — Telephone Encounter (Signed)
Monique Fleming -PT Monique Fleming [phone]    He called in to inform PCP that he completed an evaluation with pt yesterday 02/10/16. He would like verbal orders to see ptTwice a week for 3 week  He is okay with PCP or CMA leaving him a vm when call back if no answer.

## 2016-02-11 NOTE — Telephone Encounter (Signed)
OK to give VO for PT as recommended.

## 2016-02-12 ENCOUNTER — Other Ambulatory Visit: Payer: Self-pay | Admitting: Family Medicine

## 2016-02-12 ENCOUNTER — Telehealth: Payer: Self-pay | Admitting: Family Medicine

## 2016-02-12 NOTE — Telephone Encounter (Signed)
Please advise, as you have not filled this medication for pt before.  Last CMP 02/04/16, K=4.3.

## 2016-02-12 NOTE — Telephone Encounter (Signed)
ok 

## 2016-02-12 NOTE — Telephone Encounter (Signed)
Caller name: Pamala Hurry with [facility] Can be reached: [phone]   Reason for call: Pamala Hurry wanted to apologize for error she made stating pt would not be admitted for [facility] SN. She called to state correction. Pt was admitted to services for [facility] SN, PT, and OT on Saturday 02/08/16.

## 2016-02-12 NOTE — Telephone Encounter (Signed)
Orders left on VM as directed.

## 2016-02-13 ENCOUNTER — Ambulatory Visit (INDEPENDENT_AMBULATORY_CARE_PROVIDER_SITE_OTHER): Payer: Medicare Other | Admitting: Neurology

## 2016-02-13 ENCOUNTER — Encounter: Payer: Self-pay | Admitting: Neurology

## 2016-02-13 ENCOUNTER — Telehealth: Payer: Self-pay | Admitting: Family Medicine

## 2016-02-13 ENCOUNTER — Telehealth: Payer: Self-pay | Admitting: Neurology

## 2016-02-13 ENCOUNTER — Other Ambulatory Visit: Payer: Self-pay

## 2016-02-13 VITALS — BP 148/73 | HR 92 | Ht 64.0 in | Wt 119.2 lb

## 2016-02-13 DIAGNOSIS — F0391 Unspecified dementia with behavioral disturbance: Secondary | ICD-10-CM

## 2016-02-13 DIAGNOSIS — R569 Unspecified convulsions: Secondary | ICD-10-CM

## 2016-02-13 DIAGNOSIS — I639 Cerebral infarction, unspecified: Secondary | ICD-10-CM

## 2016-02-13 DIAGNOSIS — F0390 Unspecified dementia without behavioral disturbance: Secondary | ICD-10-CM | POA: Insufficient documentation

## 2016-02-13 MED ORDER — PHENYTOIN SODIUM EXTENDED 100 MG PO CAPS: 200.0000 mg | ORAL_CAPSULE | Freq: Every day | ORAL | Status: DC

## 2016-02-13 MED ORDER — MEMANTINE HCL 28 X 5 MG & 21 X 10 MG PO TABS: ORAL_TABLET | ORAL | Status: DC

## 2016-02-13 NOTE — Telephone Encounter (Signed)
OK to give VO for ST as requested

## 2016-02-13 NOTE — Telephone Encounter (Signed)
Caller name: Sonal with [facility] Can be reached: [phone]   Reason for call: Calling for VO on ST. ST eval done 02/12/16. Needing VO for ST f/u visits 2x week for 4 weeks.

## 2016-02-13 NOTE — Progress Notes (Signed)
[facility]  PATIENT: Monique Fleming DOB: 09-11-1929   REASON FOR VISIT: follow up HISTORY FROM: patient  HISTORY OF PRESENT ILLNESS: Monique Fleming is a 82 year old Caucasian female with history of stroke, mild cognitive impairment, depression, aortic stenosis and remote history of seizure. She continues to do well without any recurrent seizure or significant worsening of memory. She does crossword puzzles to keep her so busy and cognitively challenging tasks. She states in the last 2 years since her husband has died and she has been this interested in activities with little energy but she has 2 daughters to check on her regularly. She lives alone in a condominium and is still driving. She has not been seen in the office since November 2012.  She comes in the office today for new problem of right neck pain. She states she has history of stenosis and bilateral carotid arteries with endarterectomy on the left. She feels that neck pain is 2 to blockage on the right carotid. Her last carotid duplex was January 2013, which showed 50-69% stenosis of the left distal CCA and significant stenotic flow in the right ECA. Heterogeneous and acoustic shadowing I asked were observed bilaterally. Transcranial Doppler studies from January 2013 was suggestive of diffuse atherosclerosis. She states that the pain is intermittent but every day. She states the pain is sharp and burning in nature only on the right side starting at the base of her neck and shooting up behind the right ear. She states the pain is intense it makes her clench her teeth. She states she has found nothing to make the pain better. She denies any recent injury or muscle strain from lifting.   UPDATE 07/24/13 (LL):  [name] comes in for revisit for neck pain.  She had a repeat carotid doppler study which shows bilateral 50-69% stenosis.  Her neck pain has since resolved.  She tells me that she has quit smoking but the room  smells strongly of cigarettes.  She reports that Dr. Marin Olp was worried about her weight loss at last check up and had ordered a CT chest and pelvis, which was negative for metastatic disease.  She also reports that she had a fall in September on gravel in which she had a laceration over her left eye and many scrapes and bruises, but no fractures Update 01/31/2014 :. She returns for followup of the last visit 6 months ago. She is accompanied by daughter. She continues to do well from a neurovascular standpoint without recurrent stroke or TIA symptoms. She has had a few falls. She missed a step in August and has  chronic hip pain and has undergone cortisone injections but is still without relief. She is currently getting outpatient physical therapy for her hip and walking. She had followup carotid ultrasound done today in the office which shows stable appearance of the 50-69% bilateral ICA stenosis which is unchanged from the last ultrasound. She states her blood pressure is well controlled. She is tolerating Plavix without bleeding bruising or other side effects. She had lipid profile checked a few months ago by her primary physician and was apparently fine. She has chronic anemia and fatigue and does follow up with her oncologist Dr. Marin Olp for prior history of colon cancer. She states her memory difficulties are unchanged not progressive. She had a single seizure about 4 or 5 years ago and is on Dilantin 200 mg at night for that. She is had dilantin level checked her primary  physician a few months ago which was low and she wanted to increase the  dosage but I do not agree with this.. I had long discussion with the patient and daughter regarding possibility of tapering and stopping Dilantin since she has been seen seizure-free more than 4 years but the patient is not willing to take the chance of breakthrough seizures like to continue Dilantin and the current dose. Update 02/12/2016 : She returns for follow-up  after last visit 1 year ago. She is accompanied by her 2 daughters. Patient has had significant cognitive decline and that's the reason she is seeing me today. She was admitted to [facility] hospital on 01/18/16 without mental status and generalized weakness. She underwent extensive evaluation there have personally reviewed imaging studies. CT scan of the head showed no acute abnormality and MRI was negative for acute infarct. EKG showed moderate generalized slowing. Patient improved with hydration and was transferred to [facility] for rehabilitation. She was mentally doing much better in rehabilitation and physical he was gaining strength with therapy however she subsequently has had more confusion and cognitive decline. She has word finding difficulties occasionally cannot complete sentences. She is also not able to do simple tasks like using her nebulizers for breathing. On his concern about she developing new neurological events since discharge from the hospital. Patient at my last visit was on Dilantin and has been seizure-free discussed possibly tapering and stopping the seizure medication but patient had refused. However the family realizes only recently that patient had not been taking her Dilantin. Dilantin was reintroduced back in the hospital but doesn't know refill of Dilantin last Friday. She's had no witnessed seizures in the family have noticed. Review of Systems  14 system review of systems is positive for unexpected weight change, severe hearing loss, loss of vision, no murmur, anemia, memory loss, speech difficulty, word finding difficulty, confusion and all other systems negative.  ALLERGIES: Allergies  Allergen Reactions  . Bactrim [Sulfamethoxazole-Trimethoprim] Shortness Of Breath  . Levofloxacin Other (See Comments)    seizure  . Pneumovax [Pneumococcal Polysaccharide Vaccine] Other (See Comments)    Severe allergy-cellulitis  . Amoxicillin-Pot Clavulanate Other (See Comments)     Patient stated Drs. recommend taking Augmentin due to her PCN allergy.  . Penicillins Rash    Has patient had a PCN reaction causing immediate rash, facial/tongue/throat swelling, SOB or lightheadedness with hypotension: No Has patient had a PCN reaction causing severe rash involving mucus membranes or skin necrosis: No Has patient had a PCN reaction that required hospitalization No Has patient had a PCN reaction occurring within the last 10 years: No If all of the above answers are "NO", then may proceed with Cephalosporin use.   . Bupropion   . Haldol [Haloperidol Lactate]     Had unknown reaction many yrs ago per pt  . Haloperidol   . Morphine   . Morphine And Related     Confused and agitation  . Pneumococcal Vaccine   . Pseudoephedrine Hcl   . Sudafed [Pseudoephedrine Hcl] Other (See Comments)    seizure    HOME MEDICATIONS: Outpatient Prescriptions Prior to Visit  Medication Sig Dispense Refill  . albuterol (PROAIR HFA) 108 (90 BASE) MCG/ACT inhaler Inhale 2 puffs into the lungs every 4 (four) hours as needed for wheezing or shortness of breath. 3 Inhaler 2  . cholestyramine (QUESTRAN) 4 g packet MIX 1 PACKET (4 GRAM TOTAL) IN LIQUID AND DRINK DAILY AS NEEDED FOR DIARRHEA 180 each 0  .  clonazePAM (KLONOPIN) 1 MG tablet Take 1 tablet (1 mg total) by mouth 2 (two) times daily as needed for anxiety. 15 tablet 0  . clopidogrel (PLAVIX) 75 MG tablet TAKE 1 TABLET DAILY 90 tablet 2  . desvenlafaxine (PRISTIQ) 100 MG 24 hr tablet Take 100 mg by mouth daily.    . fluticasone (FLONASE) 50 MCG/ACT nasal spray USE 2 SPRAYS IN EACH NOSTRIL DAILY AS NEEDED FOR ALLERGIES 48 g 2  . formoterol (PERFOROMIST) 20 MCG/2ML nebulizer solution Take 2 mLs (20 mcg total) by nebulization 2 (two) times daily. 120 mL 5  . hydrocortisone (ANUSOL-HC) 25 MG suppository Place 25 mg rectally daily as needed.    Marland Kitchen KLOR-CON M10 10 MEQ tablet TAKE 1 TABLET TWICE A DAY 180 tablet 0  . magnesium oxide (MAG-OX) 400  MG tablet Take 400 mg by mouth 2 (two) times daily. Take 400 mg BID x4 days, then qd.    . montelukast (SINGULAIR) 10 MG tablet Take 10 mg by mouth at bedtime.    . Multiple Vitamins-Minerals (OCUVITE PRESERVISION) TABS Take by mouth 2 (two) times daily.    . ondansetron (ZOFRAN) 4 MG tablet Take 1 tablet (4 mg total) by mouth every 6 (six) hours as needed for nausea or vomiting. 60 tablet 0  . pantoprazole (PROTONIX) 40 MG tablet Take 40 mg by mouth daily.    Vladimir Faster Glycol-Propyl Glycol (SYSTANE) 0.4-0.3 % SOLN Place 1 drop into both eyes 2 (two) times daily as needed (dry eyes).     . potassium chloride (K-DUR) 10 MEQ tablet Take 10 mEq by mouth 2 (two) times daily.    . Protein (PROCEL) POWD Take 2 scoop by mouth 2 (two) times daily.    Marland Kitchen saccharomyces boulardii (FLORASTOR) 250 MG capsule Take 250 mg by mouth daily.    . sodium chloride (OCEAN) 0.65 % SOLN nasal spray Place 2 sprays into both nostrils daily as needed for congestion.     . traMADol (ULTRAM) 50 MG tablet Take 1 tablet (50 mg total) by mouth every 12 (twelve) hours as needed for severe pain. 15 tablet 0  . UNABLE TO FIND Med Name: Med pass 120 mL 3 times daily between meals for nutritional supplement    . phenytoin (DILANTIN) 100 MG ER capsule Take 200 mg by mouth at bedtime. Reported on 02/13/2016    . Cholecalciferol (VITAMIN D3) 50000 units CAPS Take 1 capsule by mouth once a week. Reported on 02/13/2016     No facility-administered medications prior to visit.    PAST MEDICAL HISTORY: Past Medical History  Diagnosis Date  . Aortic stenosis     moderate by echo 6/11 gradient 20/56mmHg for mean and peak  . TIA (transient ischemic attack)     Dr. [name]  . Peripheral vascular disease ([place])   . IBS (irritable bowel syndrome)   . Diabetes mellitus     type II  . Chronic bronchitis   . COPD (chronic obstructive pulmonary disease) ([place])   . Atherosclerosis   . Depression   . Anxiety     Dr Casimiro Needle  . Renal cyst   .  Anemia     iron deficiency  . Cellulitis   . Diverticular disease   . Infectious diarrhea(009.2)   . Sinusitis   . Thrush   . Abnormal liver function test   . GERD (gastroesophageal reflux disease)   . Insomnia     chronic  . Adenocarcinoma ([place]) 2008    colon,s/p right hemicolectomy  .  Angina   . Seizures (HCC)     hx of one seizure "  . Cirrhosis of liver (HCC)     hx of  . Clostridium difficile diarrhea   . Thrombocytopenia ([place]) 04/01/2013  . Bursitis     spine per pt  . Macular degeneration 02/18/2014    Follows with [facility]  . Internal hemorrhoids   . Asthma   . Elevated BP 07/09/2015  . Type 2 diabetes mellitus with vascular disease ([place]) 03/02/2007    Chronic    . Encephalopathy, metabolic   . Protein calorie malnutrition ([place])     PAST SURGICAL HISTORY: Past Surgical History  Procedure Laterality Date  . Carotid endarterectomy    . Colectomy      right hemi  . Cholecystectomy  11/09  . Upper gastrointestinal endoscopy    . Tonsillectomy    . Appendectomy      FAMILY HISTORY: Family History  Problem Relation Age of Onset  . Colon cancer Paternal Grandmother   . Esophageal cancer Brother   . Heart disease Mother   . Diabetes Brother   . Hypertension Father   . Rectal cancer Neg Hx   . Stomach cancer Neg Hx     SOCIAL HISTORY: Social History   Social History  . Marital Status: Widowed    Spouse Name: N/A  . Number of Children: 3  . Years of Education: N/A   Occupational History  . retired   .     Social History Main Topics  . Smoking status: Former Smoker -- 0.50 packs/day for 70 years    Types: Cigarettes    Start date: 02/14/1951    Quit date: 01/25/2016  . Smokeless tobacco: Never Used     Comment: smokes 1-2 cigarettes daily  . Alcohol Use: No  . Drug Use: No  . Sexual Activity: No   Other Topics Concern  . Not on file   Social History Narrative   Patient signed a Designated Party Release to allow her daughter, Selam Atehortua,  to have access to her medical records/information. Entered by Fleet Contras March 24,2011 @ 2:47 pm   Dailly Caffeine Use     PHYSICAL EXAM  Filed Vitals:   02/13/16 0941  BP: 148/73  Pulse: 92  Height: 5\' 4"  (1.626 m)  Weight: 119 lb 3.2 oz (54.069 kg)   Body mass index is 20.45 kg/(m^2).  Generalized: In no acute distress, pleasant elderly Caucasian female  Neck: Supple, bilateral carotid bruits vs. Aortic stenosis  Cardiac: Regular rate rhythm, 3/6 murmur  Pulmonary: Expiratory wheezes  Musculoskeletal: No deformity   Neurological examination  Mentation: Alert oriented to time, place, history taking, language fluent, and casual conversation Mini-Mental status exam scored 16/30 with deficits in orientation, attention, calculation and recall. Unable to follow 3 step commands. Animal naming test 5 only. Clock drawing 1/4. Unable to copy intersecting pentagons. Geriatric depression scale 11 suggestive of depression. Cranial nerve II-XII: Pupils were equal round reactive to light extraocular movements were full, visual field were full on confrontational test. facial sensation and strength were normal. HEARING DECREASED bilaterally. Uvula tongue midline. head turning and shoulder shrug and were normal and symmetric.  MOTOR: normal bulk and tone, full strength in the BUE, BLE, fine finger movements normal, no pronator drift  SENSORY: normal and symmetric to light touch, pinprick, temperature, vibration  COORDINATION: finger-nose-finger, heel-to-shin bilaterally, there was no truncal ataxia  REFLEXES: DTRs symmetric and equal bilaterally.  GAIT/STATION: Rising up from seated  position without assistance, normal stance, without trunk ataxia, moderate stride, good arm swing, smooth turning, able to perform tiptoe, and heel walking without difficulty. Unable to perform tandem walking  DIAGNOSTIC DATA (LABS, IMAGING, TESTING) - I reviewed patient records, labs, notes, testing and imaging myself  where available.  Lab Results  Component Value Date   WBC 6.8 02/04/2016   HGB 11.2* 02/04/2016   HCT 33.6* 02/04/2016   MCV 91 02/04/2016   PLT 142* 02/04/2016      Component Value Date/Time   NA 143 02/04/2016 1114   NA 145 01/27/2016   NA 145 01/22/2016 0444   NA 134 09/10/2015 1054   K 4.3 02/04/2016 1114   K 3.7 01/27/2016   K 3.1* 09/10/2015 1054   CL 116* 01/22/2016 0444   CL 100 09/10/2015 1054   CO2 26 02/04/2016 1114   CO2 23 01/22/2016 0444   CO2 26 09/10/2015 1054   GLUCOSE 125 02/04/2016 1114   GLUCOSE 188* 01/22/2016 0444   GLUCOSE 165* 09/10/2015 1054   BUN 16.4 02/04/2016 1114   BUN 10 01/27/2016   BUN 8 01/22/2016 0444   BUN 7 09/10/2015 1054   CREATININE 1.2* 02/04/2016 1114   CREATININE 0.8 01/27/2016   CREATININE 0.77 01/22/2016 0444   CREATININE 0.7 09/10/2015 1054   CALCIUM 9.3 02/04/2016 1114   CALCIUM 8.2* 01/22/2016 0444   CALCIUM 8.2 09/10/2015 1054   PROT 6.8 02/04/2016 1114   PROT 5.7* 01/22/2016 0444   PROT 6.3* 09/10/2015 1054   ALBUMIN 3.2* 02/04/2016 1114   ALBUMIN 2.7* 01/22/2016 0444   ALBUMIN 2.7* 09/10/2015 1054   AST 55* 02/04/2016 1114   AST 39* 01/27/2016   AST 34 09/10/2015 1054   ALT 33 02/04/2016 1114   ALT 17 01/27/2016   ALT 13 09/10/2015 1054   ALKPHOS 101 02/04/2016 1114   ALKPHOS 99 01/27/2016   ALKPHOS 100* 09/10/2015 1054   BILITOT 0.50 02/04/2016 1114   BILITOT 0.6 01/22/2016 0444   BILITOT 0.80 09/10/2015 1054   GFRNONAA >60 01/22/2016 0444   GFRNONAA 60 05/16/2014 1446   GFRAA >60 01/22/2016 0444   GFRAA 69 05/16/2014 1446   Lab Results  Component Value Date   CHOL 118 01/19/2016   HDL 26* 01/19/2016   LDLCALC 77 01/19/2016   LDLDIRECT 124.3 03/20/2010   TRIG 76 01/19/2016   CHOLHDL 4.5 01/19/2016   Lab Results  Component Value Date   HGBA1C 5.3 01/19/2016   Lab Results  Component Value Date   VITAMINB12 878 01/19/2016   Lab Results  Component Value Date   TSH 2.297 01/19/2016    CAROTID DUPLEX STUDY 10/07/11  The study is consistent with 50-69% stenosis of the left distal CCA and significant stenotic flow in the right ECA. Heterogeneous and acoustic shadow plaques were observed bilaterally.  TCD 10/07/11  This limited study is consistent with diminished CBFV pattern seen in the right MCA M2. The cause and clinical significance of this finding is unclear. In addition, abnormally elevated CBFVs in the right/left OA's and VA's, but without TCD signs of stenosis. Elevated the eyes are suggestive of diffuse atherosclerosis.  04/19/13 2D ECHO The estimated ejection fraction was in the range of 65% to 70%. Doppler parameters are consistent with abnormal left ventricular relaxation (grade 1 diastolic dysfunction). 06/14/13 CT CHEST IMPRESSION  No evidence for metastatic disease to the chest. Emphysema. Coronary artery calcifications.  06/14/13 CT ABDOMEN AND PELVIS IMPRESSION  No evidence for metastatic disease to the abdomen or  pelvis. Morphologic features of the liver compatible with cirrhosis and portal venous hypertension. 06/21/13 CT HEAD WITHOUT CONTRAST  Scalp soft tissue injury without underlying fracture.    No acute traumatic injury to the brain.  Mild for age chronic small vessel disease has mildly progressed since 2009.  06/21/13  CT CERVICAL SPINE IMPRESSION  No acute fracture or listhesis identified in the cervical spine.  Ligamentous injury is not excluded.  ASSESSMENT AND PLAN Ms. Amariona Tousley is a 82 year old Caucasian female with history of stroke, mild cognitive impairment, depression, aortic stenosis, carotid stenosis and remote history of seizure.  Bilateral ICA stenosis, 50-69%. Recent hospitalization in April 2017 for  mental status from which she has improved physically but cognitively declined and now may have developed dementia   PLAN:   I had a long discussion the patient and her 2 daughters regarding her recent cognitive decline following  hospitalization and discussed the differential diagnosis. She had mild cognitive impairment at baseline and I feel she may have progressed now to dementia likely Alzheimer's type. However due to sudden worsening after she came home we will check another EEG seizures as well as an MRI scan for interval new stroke. Will check vitamin D levels upon family's request. Start Namenda starter pack and increase as tolerated. Resume Dilantin ER 200 mg at night for seizures that she had inadvertently stopped. The patient is clearly going to need more supervision and help at home and family understands this and is willing to provide the support she needs. Continue Plavix for stroke prevention with strict control of hypertension with blood pressure goal below 130/90 and lipids with LDL cholesterol goal below 70 mg percent. This was a prolonged office visit requiring extensive review of data, discussion and counseling of the family and medical decision making of high complexity. Greater than 50% time during this 40 minute visit spent on counseling and coordination of care. About her dementia strokes and seizures She will return for follow-up in 2 months or call earlier if necessary.   Antony Contras, MD  02/13/2016, 11:38 AM [facility] [address] [place], [place] [address] [phone]

## 2016-02-13 NOTE — Telephone Encounter (Signed)
Daughter Macky Lower [phone] called regarding prescriptions that were sent to [facility] today.

## 2016-02-13 NOTE — Telephone Encounter (Signed)
[facility]n [facility] to cancel dilantin and namenda medication. Medication sent to [facility].

## 2016-02-13 NOTE — Telephone Encounter (Signed)
HHRN informed of verbal ok. 

## 2016-02-13 NOTE — Patient Instructions (Signed)
I had a long discussion the patient and her 2 daughters regarding her recent cognitive decline following hospitalization and discussed the differential diagnosis. She had mild cognitive impairment at baseline and I feel she may have progressed now to dementia likely Alzheimer's type. However due to sudden worsening after she came home we will check another EEG seizures as well as an MRI scan for interval new stroke. Will check vitamin D levels upon family's request. Start Namenda starter pack and increase as tolerated. Resume Dilantin ER 200 mg at night for seizures that she had inadvertently stopped. The patient is clearly going to need more supervision and help at home and family understands this and is willing to provide the support she needs. She will return for follow-up in 2 months or call earlier if necessary. Dementia Dementia is a general term for problems with brain function. A person with dementia has memory loss and a hard time with at least one other brain function such as thinking, speaking, or problem solving. Dementia can affect social functioning, how you do your job, your mood, or your personality. The changes may be hidden for a long time. The earliest forms of this disease are usually not detected by family or friends. Dementia can be:  Irreversible.  Potentially reversible.  Partially reversible.  Progressive. This means it can get worse over time. CAUSES  Irreversible dementia causes may include:  Degeneration of brain cells (Alzheimer disease or Lewy body dementia).  Multiple small strokes (vascular dementia).  Infection (chronic meningitis or Creutzfeldt-Jakob disease).  Frontotemporal dementia. This affects younger people, age 87 to 62, compared to those who have Alzheimer disease.  Dementia associated with other disorders like Parkinson disease, Huntington disease, or HIV-associated dementia. Potentially or partially reversible dementia causes may  include:  Medicines.  Metabolic causes such as excessive alcohol intake, vitamin B12 deficiency, or thyroid disease.  Masses or pressure in the brain such as a tumor, blood clot, or hydrocephalus. SIGNS AND SYMPTOMS  Symptoms are often hard to detect. Family members or coworkers may not notice them early in the disease process. Different people with dementia may have different symptoms. Symptoms can include:  A hard time with memory, especially recent memory. Long-term memory may not be impaired.  Asking the same question multiple times or forgetting something someone just said.  A hard time speaking your thoughts or finding certain words.  A hard time solving problems or performing familiar tasks (such as how to use a telephone).  Sudden changes in mood.  Changes in personality, especially increasing moodiness or mistrust.  Depression.  A hard time understanding complex ideas that were never a problem in the past. DIAGNOSIS  There are no specific tests for dementia.   Your health care provider may recommend a thorough evaluation. This is because some forms of dementia can be reversible. The evaluation will likely include a physical exam and getting a detailed history from you and a family member. The history often gives the best clues and suggestions for a diagnosis.  Memory testing may be done. A detailed brain function evaluation called neuropsychologic testing may be helpful.  Lab tests and brain imaging (such as a CT scan or MRI scan) are sometimes important.  Sometimes observation and re-evaluation over time is very helpful. TREATMENT  Treatment depends on the cause.   If the problem is a vitamin deficiency, it may be helped or cured with supplements.  For dementias such as Alzheimer disease, medicines are available to stabilize or slow the  course of the disease. There are no cures for this type of dementia.  Your health care provider can help direct you to groups,  organizations, and other health care providers to help with decisions in the care of you or your loved one. HOME CARE INSTRUCTIONS The care of individuals with dementia is varied and dependent upon the progression of the dementia. The following suggestions are intended for the person living with, or caring for, the person with dementia.  Create a safe environment.  Remove the locks on bathroom doors to prevent the person from accidentally locking himself or herself in.  Use childproof latches on kitchen cabinets and any place where cleaning supplies, chemicals, or alcohol are kept.  Use childproof covers in unused electrical outlets.  Install childproof devices to keep doors and windows secured.  Remove stove knobs or install safety knobs and an automatic shut-off on the stove.  Lower the temperature on water heaters.  Label medicines and keep them locked up.  Secure knives, lighters, matches, power tools, and guns, and keep these items out of reach.  Keep the house free from clutter. Remove rugs or anything that might contribute to a fall.  Remove objects that might break and hurt the person.  Make sure lighting is good, both inside and outside.  Install grab rails as needed.  Use a monitoring device to alert you to falls or other needs for help.  Reduce confusion.  Keep familiar objects and people around.  Use night lights or dim lights at night.  Label items or areas.  Use reminders, notes, or directions for daily activities or tasks.  Keep a simple, consistent routine for waking, meals, bathing, dressing, and bedtime.  Create a calm, quiet environment.  Place large clocks and calendars prominently.  Display emergency numbers and home address near all telephones.  Use cues to establish different times of the day. An example is to open curtains to let the natural light in during the day.   Use effective communication.  Choose simple words and short  sentences.  Use a gentle, calm tone of voice.  Be careful not to interrupt.  If the person is struggling to find a word or communicate a thought, try to provide the word or thought.  Ask one question at a time. Allow the person ample time to answer questions. Repeat the question again if the person does not respond.  Reduce nighttime restlessness.  Provide a comfortable bed.  Have a consistent nighttime routine.  Ensure a regular walking or physical activity schedule. Involve the person in daily activities as much as possible.  Limit napping during the day.  Limit caffeine.  Attend social events that stimulate rather than overwhelm the senses.  Encourage good nutrition and hydration.  Reduce distractions during meal times and snacks.  Avoid foods that are too hot or too cold.  Monitor chewing and swallowing ability.  Continue with routine vision, hearing, dental, and medical screenings.  Give medicines only as directed by the health care provider.  Monitor driving abilities. Do not allow the person to drive when safe driving is no longer possible.  Register with an identification program which could provide location assistance in the event of a missing person situation. SEEK MEDICAL CARE IF:   New behavioral problems start such as moodiness, aggressiveness, or seeing things that are not there (hallucinations).  Any new problem with brain function happens. This includes problems with balance, speech, or falling a lot.  Problems with swallowing develop.  Any symptoms of other illness happen. Small changes or worsening in any aspect of brain function can be a sign that the illness is getting worse. It can also be a sign of another medical illness such as infection. Seeing a health care provider right away is important. SEEK IMMEDIATE MEDICAL CARE IF:   A fever develops.  New or worsened confusion develops.  New or worsened sleepiness develops.  Staying awake  becomes hard to do.   This information is not intended to replace advice given to you by your health care provider. Make sure you discuss any questions you have with your health care provider.   Document Released: 03/17/2001 Document Revised: 10/12/2014 Document Reviewed: 02/16/2011 Elsevier Interactive Patient Education [employer].

## 2016-02-13 NOTE — Telephone Encounter (Signed)
Rn call [facility] to find out about the medication for namenda and dilantin. [name] stated [facility] it and its back at [facility]. [facility] had to order the meds. They will call pts daughter when available.

## 2016-02-13 NOTE — Telephone Encounter (Signed)
Rn call [name] about her moms medication. [name] stated that [facility] cannot refill the medication, because it was sent to the mail [facility]. Rn stated a call was made to [facility] to cancel medication but it was in process mode. [name] stated the insurance will not allow [facility] to fill the medication. Rn gave [name] [facility] toll free number to call about if has been mail. [name] will call back if there any issues.

## 2016-02-13 NOTE — Telephone Encounter (Signed)
Rn change med to no refills and pt will get maintance dosage after 30 days. Rn call and talk to Sam.

## 2016-02-13 NOTE — Telephone Encounter (Signed)
Monique Fleming [phone] called regarding memantine (NAMENDA TITRATION PAK) tablet pack, Rx written for 12 refills, typically titration pack is for 1 month then a separate Rx for maintenance dose.

## 2016-02-13 NOTE — Telephone Encounter (Signed)
Caller name: Pilar Plate OT with [facility] Can be reached: [phone]   Reason for call: VO for 1 week 1, 2 week 2, 1 week 1 for ADLs for fine motor coordination. Please call with VO, ok to leave on confidential VM.

## 2016-02-16 NOTE — Telephone Encounter (Signed)
OK to give OT order as requested

## 2016-02-17 ENCOUNTER — Other Ambulatory Visit: Payer: Medicare Other

## 2016-02-17 NOTE — Telephone Encounter (Signed)
Called [facility], no answer and mail box full

## 2016-02-17 NOTE — Telephone Encounter (Signed)
[facility] informed of PCP verbal ok for OT.

## 2016-02-18 ENCOUNTER — Telehealth: Payer: Self-pay | Admitting: *Deleted

## 2016-02-18 ENCOUNTER — Encounter: Payer: Self-pay | Admitting: Family Medicine

## 2016-02-18 ENCOUNTER — Other Ambulatory Visit: Payer: Self-pay | Admitting: Family Medicine

## 2016-02-18 ENCOUNTER — Other Ambulatory Visit: Payer: Self-pay | Admitting: Internal Medicine

## 2016-02-18 LAB — VITAMIN D 1,25 DIHYDROXY
VITAMIN D 1, 25 (OH) TOTAL: 43 pg/mL
Vitamin D3 1, 25 (OH)2: 34 pg/mL

## 2016-02-18 MED ORDER — GLUCOSE BLOOD VI STRP: ORAL_STRIP | Status: AC

## 2016-02-18 NOTE — Telephone Encounter (Signed)
PA approved 01/19/2016 to 10/04/2098. JG//CMA

## 2016-02-24 ENCOUNTER — Encounter: Payer: Self-pay | Admitting: Family Medicine

## 2016-02-24 ENCOUNTER — Telehealth: Payer: Self-pay | Admitting: *Deleted

## 2016-02-24 NOTE — Telephone Encounter (Signed)
Forwarded to Dr. Blyth. JG//CMA  

## 2016-02-25 ENCOUNTER — Other Ambulatory Visit: Payer: Self-pay | Admitting: Family Medicine

## 2016-02-25 MED ORDER — MAGNESIUM 400 MG PO TABS: 400.0000 mg | ORAL_TABLET | Freq: Two times a day (BID) | ORAL | Status: DC

## 2016-02-26 ENCOUNTER — Encounter: Payer: Self-pay | Admitting: Family

## 2016-02-27 ENCOUNTER — Encounter (HOSPITAL_BASED_OUTPATIENT_CLINIC_OR_DEPARTMENT_OTHER): Payer: Self-pay

## 2016-02-27 ENCOUNTER — Inpatient Hospital Stay (HOSPITAL_BASED_OUTPATIENT_CLINIC_OR_DEPARTMENT_OTHER)
Admission: EM | Admit: 2016-02-27 | Discharge: 2016-03-02 | DRG: 441 | Disposition: A | Discharge status: Home / Self Care | Payer: Medicare Other | Attending: Internal Medicine | Admitting: Internal Medicine

## 2016-02-27 ENCOUNTER — Emergency Department (HOSPITAL_BASED_OUTPATIENT_CLINIC_OR_DEPARTMENT_OTHER): Payer: Medicare Other

## 2016-02-27 ENCOUNTER — Telehealth: Payer: Self-pay | Admitting: Family Medicine

## 2016-02-27 DIAGNOSIS — R410 Disorientation, unspecified: Secondary | ICD-10-CM

## 2016-02-27 DIAGNOSIS — E872 Acidosis, unspecified: Secondary | ICD-10-CM | POA: Diagnosis present

## 2016-02-27 DIAGNOSIS — R4182 Altered mental status, unspecified: Secondary | ICD-10-CM | POA: Insufficient documentation

## 2016-02-27 DIAGNOSIS — F419 Anxiety disorder, unspecified: Secondary | ICD-10-CM | POA: Diagnosis present

## 2016-02-27 DIAGNOSIS — F09 Unspecified mental disorder due to known physiological condition: Secondary | ICD-10-CM | POA: Diagnosis present

## 2016-02-27 DIAGNOSIS — E1151 Type 2 diabetes mellitus with diabetic peripheral angiopathy without gangrene: Secondary | ICD-10-CM | POA: Diagnosis present

## 2016-02-27 DIAGNOSIS — E119 Type 2 diabetes mellitus without complications: Secondary | ICD-10-CM

## 2016-02-27 DIAGNOSIS — I509 Heart failure, unspecified: Secondary | ICD-10-CM

## 2016-02-27 DIAGNOSIS — Z7951 Long term (current) use of inhaled steroids: Secondary | ICD-10-CM

## 2016-02-27 DIAGNOSIS — K7682 Hepatic encephalopathy: Secondary | ICD-10-CM

## 2016-02-27 DIAGNOSIS — K7290 Hepatic failure, unspecified without coma: Secondary | ICD-10-CM | POA: Diagnosis not present

## 2016-02-27 DIAGNOSIS — F418 Other specified anxiety disorders: Secondary | ICD-10-CM

## 2016-02-27 DIAGNOSIS — Z85038 Personal history of other malignant neoplasm of large intestine: Secondary | ICD-10-CM

## 2016-02-27 DIAGNOSIS — Z66 Do not resuscitate: Secondary | ICD-10-CM | POA: Diagnosis present

## 2016-02-27 DIAGNOSIS — Z7902 Long term (current) use of antithrombotics/antiplatelets: Secondary | ICD-10-CM

## 2016-02-27 DIAGNOSIS — G9340 Encephalopathy, unspecified: Secondary | ICD-10-CM | POA: Diagnosis present

## 2016-02-27 DIAGNOSIS — G3184 Mild cognitive impairment, so stated: Secondary | ICD-10-CM | POA: Diagnosis present

## 2016-02-27 DIAGNOSIS — F329 Major depressive disorder, single episode, unspecified: Secondary | ICD-10-CM | POA: Diagnosis present

## 2016-02-27 DIAGNOSIS — G40909 Epilepsy, unspecified, not intractable, without status epilepticus: Secondary | ICD-10-CM | POA: Diagnosis present

## 2016-02-27 DIAGNOSIS — I5031 Acute diastolic (congestive) heart failure: Secondary | ICD-10-CM | POA: Diagnosis present

## 2016-02-27 DIAGNOSIS — Z8673 Personal history of transient ischemic attack (TIA), and cerebral infarction without residual deficits: Secondary | ICD-10-CM

## 2016-02-27 DIAGNOSIS — J449 Chronic obstructive pulmonary disease, unspecified: Secondary | ICD-10-CM | POA: Diagnosis present

## 2016-02-27 DIAGNOSIS — Z09 Encounter for follow-up examination after completed treatment for conditions other than malignant neoplasm: Secondary | ICD-10-CM

## 2016-02-27 DIAGNOSIS — I350 Nonrheumatic aortic (valve) stenosis: Secondary | ICD-10-CM | POA: Diagnosis present

## 2016-02-27 DIAGNOSIS — I110 Hypertensive heart disease with heart failure: Secondary | ICD-10-CM | POA: Diagnosis present

## 2016-02-27 DIAGNOSIS — Z87891 Personal history of nicotine dependence: Secondary | ICD-10-CM

## 2016-02-27 DIAGNOSIS — F068 Other specified mental disorders due to known physiological condition: Secondary | ICD-10-CM

## 2016-02-27 DIAGNOSIS — J3489 Other specified disorders of nose and nasal sinuses: Secondary | ICD-10-CM

## 2016-02-27 DIAGNOSIS — K589 Irritable bowel syndrome without diarrhea: Secondary | ICD-10-CM | POA: Diagnosis present

## 2016-02-27 LAB — CBC WITH DIFFERENTIAL/PLATELET
BASOS PCT: 1 %
Basophils Absolute: 0 10*3/uL (ref 0.0–0.1)
EOS ABS: 0.2 10*3/uL (ref 0.0–0.7)
Eosinophils Relative: 4 %
HCT: 34.4 % — ABNORMAL LOW (ref 36.0–46.0)
HEMOGLOBIN: 11.1 g/dL — AB (ref 12.0–15.0)
LYMPHS ABS: 0.8 10*3/uL (ref 0.7–4.0)
LYMPHS PCT: 18 %
MCH: 31.1 pg (ref 26.0–34.0)
MCHC: 32.3 g/dL (ref 30.0–36.0)
MCV: 96.4 fL (ref 78.0–100.0)
Monocytes Absolute: 0.5 10*3/uL (ref 0.1–1.0)
Monocytes Relative: 11 %
NEUTROS ABS: 2.9 10*3/uL (ref 1.7–7.7)
Neutrophils Relative %: 66 %
Platelets: 112 10*3/uL — ABNORMAL LOW (ref 150–400)
RBC: 3.57 MIL/uL — ABNORMAL LOW (ref 3.87–5.11)
RDW: 16.7 % — AB (ref 11.5–15.5)
WBC: 4.4 10*3/uL (ref 4.0–10.5)

## 2016-02-27 LAB — URINALYSIS, ROUTINE W REFLEX MICROSCOPIC
Bilirubin Urine: NEGATIVE
Glucose, UA: NEGATIVE mg/dL
Hgb urine dipstick: NEGATIVE
KETONES UR: NEGATIVE mg/dL
LEUKOCYTES UA: NEGATIVE
NITRITE: NEGATIVE
PH: 7.5 (ref 5.0–8.0)
PROTEIN: NEGATIVE mg/dL
Specific Gravity, Urine: 1.016 (ref 1.005–1.030)

## 2016-02-27 LAB — I-STAT CG4 LACTIC ACID, ED: Lactic Acid, Venous: 2.42 mmol/L (ref 0.5–2.0)

## 2016-02-27 LAB — BLOOD GAS, ARTERIAL
Acid-Base Excess: 0.4 mmol/L (ref 0.0–2.0)
Bicarbonate: 23.7 mEq/L (ref 20.0–24.0)
DRAWN BY: 252031
FIO2: 0.21
O2 Saturation: 94.2 %
PCO2 ART: 33.4 mmHg — AB (ref 35.0–45.0)
PH ART: 7.465 — AB (ref 7.350–7.450)
Patient temperature: 98.6
TCO2: 24.7 mmol/L (ref 0–100)
pO2, Arterial: 72.7 mmHg — ABNORMAL LOW (ref 80.0–100.0)

## 2016-02-27 LAB — COMPREHENSIVE METABOLIC PANEL
ALK PHOS: 107 U/L (ref 38–126)
ALT: 27 U/L (ref 14–54)
ANION GAP: 8 (ref 5–15)
AST: 59 U/L — ABNORMAL HIGH (ref 15–41)
Albumin: 3 g/dL — ABNORMAL LOW (ref 3.5–5.0)
BUN: 22 mg/dL — ABNORMAL HIGH (ref 6–20)
CALCIUM: 8.9 mg/dL (ref 8.9–10.3)
CO2: 25 mmol/L (ref 22–32)
Chloride: 107 mmol/L (ref 101–111)
Creatinine, Ser: 0.85 mg/dL (ref 0.44–1.00)
GFR calc non Af Amer: >60 mL/min (ref >60)
Glucose, Bld: 114 mg/dL — ABNORMAL HIGH (ref 65–99)
POTASSIUM: 3.9 mmol/L (ref 3.5–5.1)
SODIUM: 140 mmol/L (ref 135–145)
TOTAL PROTEIN: 6.4 g/dL — AB (ref 6.5–8.1)
Total Bilirubin: 0.6 mg/dL (ref 0.3–1.2)

## 2016-02-27 LAB — PHENYTOIN LEVEL, TOTAL: Phenytoin Lvl: 3 ug/mL — ABNORMAL LOW (ref 10.0–20.0)

## 2016-02-27 LAB — LACTIC ACID, PLASMA
LACTIC ACID, VENOUS: 1.4 mmol/L (ref 0.5–2.0)
Lactic Acid, Venous: 1.7 mmol/L (ref 0.5–2.0)

## 2016-02-27 LAB — AMMONIA: Ammonia: 99 umol/L — ABNORMAL HIGH (ref 9–35)

## 2016-02-27 LAB — TROPONIN I
TROPONIN I: 0.06 ng/mL — AB (ref <0.031)
TROPONIN I: 0.06 ng/mL — AB (ref <0.031)
TROPONIN I: 0.06 ng/mL — AB (ref <0.031)

## 2016-02-27 LAB — CBG MONITORING, ED: GLUCOSE-CAPILLARY: 108 mg/dL — AB (ref 65–99)

## 2016-02-27 LAB — MAGNESIUM: MAGNESIUM: 1.4 mg/dL — AB (ref 1.7–2.4)

## 2016-02-27 LAB — BRAIN NATRIURETIC PEPTIDE: B Natriuretic Peptide: 535.4 pg/mL — ABNORMAL HIGH (ref 0.0–100.0)

## 2016-02-27 MED ORDER — MAGNESIUM OXIDE 400 (241.3 MG) MG PO TABS
400.0000 mg | ORAL_TABLET | Freq: Two times a day (BID) | ORAL | Status: DC
  Administered 2016-02-28 – 2016-03-02 (×8): 400 mg via ORAL
  Filled 2016-02-27 (×8): qty 1

## 2016-02-27 MED ORDER — POTASSIUM CHLORIDE CRYS ER 10 MEQ PO TBCR
10.0000 meq | EXTENDED_RELEASE_TABLET | Freq: Two times a day (BID) | ORAL | Status: DC
  Administered 2016-02-28 – 2016-03-02 (×8): 10 meq via ORAL
  Filled 2016-02-27 (×8): qty 1

## 2016-02-27 MED ORDER — ONDANSETRON HCL 4 MG/2ML IJ SOLN: 4.0000 mg | Freq: Four times a day (QID) | INTRAMUSCULAR | Status: DC | PRN

## 2016-02-27 MED ORDER — STROKE: EARLY STAGES OF RECOVERY BOOK
Freq: Once | Status: AC
  Administered 2016-02-27: 22:00:00
  Filled 2016-02-27: qty 1

## 2016-02-27 MED ORDER — LACTULOSE 10 GM/15ML PO SOLN
30.0000 g | ORAL | Status: AC
  Administered 2016-02-27: 30 g via ORAL
  Filled 2016-02-27: qty 45

## 2016-02-27 MED ORDER — FLUTICASONE PROPIONATE 50 MCG/ACT NA SUSP: 1.0000 | Freq: Every day | NASAL | Status: DC | PRN

## 2016-02-27 MED ORDER — PROSIGHT PO TABS
1.0000 | ORAL_TABLET | Freq: Two times a day (BID) | ORAL | Status: DC
  Administered 2016-02-28 – 2016-03-02 (×8): 1 via ORAL
  Filled 2016-02-27 (×8): qty 1

## 2016-02-27 MED ORDER — ACETAMINOPHEN 650 MG RE SUPP
650.0000 mg | Freq: Four times a day (QID) | RECTAL | Status: DC | PRN
Start: 2016-02-27 — End: 2016-03-02

## 2016-02-27 MED ORDER — CLOPIDOGREL BISULFATE 75 MG PO TABS
75.0000 mg | ORAL_TABLET | Freq: Every day | ORAL | Status: DC
  Administered 2016-02-28 – 2016-03-02 (×4): 75 mg via ORAL
  Filled 2016-02-27 (×4): qty 1

## 2016-02-27 MED ORDER — ARFORMOTEROL TARTRATE 15 MCG/2ML IN NEBU
15.0000 ug | INHALATION_SOLUTION | Freq: Two times a day (BID) | RESPIRATORY_TRACT | Status: DC
  Administered 2016-02-27 – 2016-03-02 (×7): 15 ug via RESPIRATORY_TRACT
  Filled 2016-02-27 (×8): qty 2

## 2016-02-27 MED ORDER — PANTOPRAZOLE SODIUM 40 MG PO TBEC
40.0000 mg | DELAYED_RELEASE_TABLET | Freq: Every morning | ORAL | Status: DC
  Administered 2016-02-28 – 2016-03-02 (×4): 40 mg via ORAL
  Filled 2016-02-27 (×4): qty 1

## 2016-02-27 MED ORDER — LACTULOSE 10 GM/15ML PO SOLN
30.0000 g | Freq: Three times a day (TID) | ORAL | Status: AC
  Administered 2016-02-28 (×2): 30 g via ORAL
  Filled 2016-02-27 (×2): qty 45

## 2016-02-27 MED ORDER — ALBUTEROL SULFATE (2.5 MG/3ML) 0.083% IN NEBU
2.5000 mg | INHALATION_SOLUTION | RESPIRATORY_TRACT | Status: DC | PRN
  Administered 2016-02-29: 2.5 mg via RESPIRATORY_TRACT
  Filled 2016-02-27: qty 3

## 2016-02-27 MED ORDER — ACETAMINOPHEN 325 MG PO TABS: 650.0000 mg | ORAL_TABLET | Freq: Four times a day (QID) | ORAL | Status: DC | PRN

## 2016-02-27 MED ORDER — FUROSEMIDE 10 MG/ML IJ SOLN
20.0000 mg | INTRAMUSCULAR | Status: AC
  Administered 2016-02-27: 20 mg via INTRAVENOUS
  Filled 2016-02-27: qty 2

## 2016-02-27 MED ORDER — MONTELUKAST SODIUM 10 MG PO TABS
10.0000 mg | ORAL_TABLET | Freq: Every day | ORAL | Status: DC
  Administered 2016-02-27 – 2016-03-01 (×4): 10 mg via ORAL
  Filled 2016-02-27 (×4): qty 1

## 2016-02-27 MED ORDER — SALINE SPRAY 0.65 % NA SOLN: 2.0000 | Freq: Every day | NASAL | Status: DC | PRN

## 2016-02-27 MED ORDER — POLYVINYL ALCOHOL 1.4 % OP SOLN: 1.0000 [drp] | Freq: Two times a day (BID) | OPHTHALMIC | Status: DC | PRN

## 2016-02-27 MED ORDER — PHENYTOIN SODIUM EXTENDED 100 MG PO CAPS
200.0000 mg | ORAL_CAPSULE | Freq: Every day | ORAL | Status: DC
  Administered 2016-02-28 – 2016-03-01 (×4): 200 mg via ORAL
  Filled 2016-02-27 (×4): qty 2

## 2016-02-27 MED ORDER — ONDANSETRON HCL 4 MG PO TABS: 4.0000 mg | ORAL_TABLET | Freq: Four times a day (QID) | ORAL | Status: DC | PRN

## 2016-02-27 MED ORDER — MAGNESIUM CHLORIDE 64 MG PO TBEC
2.0000 | DELAYED_RELEASE_TABLET | Freq: Two times a day (BID) | ORAL | Status: DC
  Filled 2016-02-27: qty 2

## 2016-02-27 MED ORDER — SENNOSIDES-DOCUSATE SODIUM 8.6-50 MG PO TABS: 1.0000 | ORAL_TABLET | Freq: Every evening | ORAL | Status: DC | PRN

## 2016-02-27 MED ORDER — MAGNESIUM SULFATE 50 % IJ SOLN
1.0000 g | Freq: Once | INTRAMUSCULAR | Status: AC
  Administered 2016-02-27: 1 g via INTRAVENOUS
  Filled 2016-02-27: qty 2

## 2016-02-27 MED ORDER — CLONAZEPAM 1 MG PO TABS
1.0000 mg | ORAL_TABLET | Freq: Two times a day (BID) | ORAL | Status: DC | PRN
  Administered 2016-02-28 – 2016-03-01 (×3): 1 mg via ORAL
  Filled 2016-02-27 (×3): qty 1

## 2016-02-27 MED ORDER — ENOXAPARIN SODIUM 40 MG/0.4ML ~~LOC~~ SOLN
40.0000 mg | SUBCUTANEOUS | Status: DC
  Administered 2016-02-27 – 2016-02-29 (×3): 40 mg via SUBCUTANEOUS
  Filled 2016-02-27 (×4): qty 0.4

## 2016-02-27 NOTE — Telephone Encounter (Signed)
PT Monique Fleming with [facility] needs to continue PT for twice weekly for 3 weeks. Verbal is ok

## 2016-02-27 NOTE — Telephone Encounter (Signed)
OK to give verbal order to continue PT as requested

## 2016-02-27 NOTE — ED Notes (Signed)
Pts family states she was just d/c from hospital for same.  Reports woke this morning with AMS, difficulty understanding daily activities.  Recent admission was metabolic encephalopathy vs dementia but family reports neurologist didn't agree.

## 2016-02-27 NOTE — ED Notes (Signed)
Attempted report to floor. Unable to take report at this time.

## 2016-02-27 NOTE — ED Provider Notes (Signed)
CSN: [id number]     Arrival date & time 02/27/16  1416 History   First MD Initiated Contact with Patient 02/27/16 1423     No chief complaint on file.    (Consider location/radiation/quality/duration/timing/severity/associated sxs/prior Treatment) HPI   Level V caveat applies due to confusion.  Monique Fleming is a 82 y.o. female, with a history of TIA, COPD, metabolic encephalopathy, and DM, presenting to the ED with confusion and abnormal behavior since this morning. Spoke to her daughter on the phone at 0830 this morning and sounded normal. Was reported to be normal when home health arrived around 9:30 AM this morning. Speech therapist notified daughter at around 65 AM that patient was not acting right. Some confusion and problems with coordination. Examples: Trying to drink from a cup with the lid still on, could not open some candy, so she put the wrapper and the candy both in her mouth, walked to fridge and stood there staring.  Admitted to [facility] on April 15 for 4 days due to weakness, diagnosed with metabolic encephalopathy and hypomagnesemia. Was independent prior to April 15 admission. Following hospital discharge, patient spent 2 weeks in a rehabilitation facility. Was evaluated for dementia, but ultimately this was ruled out as a cause for patient's issues that spurred the April 15 admission. Upon her release home, patient continued to get PT, OT, and speech therapy. Pt returned to normal functioning while at the rehab facility. Patient states she feels well and denies any complaints. Specifically denies chest pain, shortness of breath, weakness, neuro deficits, recent illness, or any other complaints. Also denies falls or trauma.  Past Medical History  Diagnosis Date  . Aortic stenosis     moderate by echo 6/11 gradient 20/22mmHg for mean and peak  . TIA (transient ischemic attack)     Dr. [name]  . Peripheral vascular disease ([place])   . IBS (irritable bowel syndrome)   . Diabetes  mellitus     type II  . Chronic bronchitis   . COPD (chronic obstructive pulmonary disease) ([place])   . Atherosclerosis   . Depression   . Anxiety     Dr Casimiro Needle  . Renal cyst   . Anemia     iron deficiency  . Cellulitis   . Diverticular disease   . Infectious diarrhea(009.2)   . Sinusitis   . Thrush   . Abnormal liver function test   . GERD (gastroesophageal reflux disease)   . Insomnia     chronic  . Adenocarcinoma ([place]) 2008    colon,s/p right hemicolectomy  . Angina   . Seizures (HCC)     hx of one seizure "  . Cirrhosis of liver (HCC)     hx of  . Clostridium difficile diarrhea   . Thrombocytopenia ([place]) 04/01/2013  . Bursitis     spine per pt  . Macular degeneration 02/18/2014    Follows with [facility]  . Internal hemorrhoids   . Asthma   . Elevated BP 07/09/2015  . Type 2 diabetes mellitus with vascular disease ([place]) 03/02/2007    Chronic    . Encephalopathy, metabolic   . Protein calorie malnutrition [facility])    Past Surgical History  Procedure Laterality Date  . Carotid endarterectomy    . Colectomy      right hemi  . Cholecystectomy  11/09  . Upper gastrointestinal endoscopy    . Tonsillectomy    . Appendectomy     Family History  Problem Relation Age of  Onset  . Colon cancer Paternal Grandmother   . Esophageal cancer Brother   . Heart disease Mother   . Diabetes Brother   . Hypertension Father   . Rectal cancer Neg Hx   . Stomach cancer Neg Hx    Social History  Substance Use Topics  . Smoking status: Former Smoker -- 0.50 packs/day for 70 years    Types: Cigarettes    Start date: 02/14/1949    Quit date: 01/25/2016  . Smokeless tobacco: Never Used     Comment: smokes 1-2 cigarettes daily  . Alcohol Use: No   OB History    No data available     Review of Systems  Unable to perform ROS: Mental status change  Constitutional: Negative for fever and chills.  Respiratory: Negative for shortness of breath.   Cardiovascular: Negative for chest  pain.  Gastrointestinal: Negative for nausea, vomiting, abdominal pain, diarrhea and constipation.  Genitourinary: Negative for dysuria and hematuria.  Musculoskeletal: Negative for back pain, neck pain and neck stiffness.  Skin: Negative for color change and pallor.  Neurological: Negative for dizziness, syncope, weakness, light-headedness, numbness and headaches.  Psychiatric/Behavioral: Positive for behavioral problems and confusion.  All other systems reviewed and are negative.     Allergies  Bactrim; Levofloxacin; Pneumovax; Amoxicillin-pot clavulanate; Penicillins; Bupropion; Haldol; Haloperidol; Morphine; Morphine and related; Pneumococcal vaccine; Pseudoephedrine hcl; and Sudafed  Home Medications   Prior to Admission medications   Medication Sig Start Date End Date Taking? Authorizing Provider  albuterol (PROAIR HFA) 108 (90 BASE) MCG/ACT inhaler Inhale 2 puffs into the lungs every 4 (four) hours as needed for wheezing or shortness of breath. 11/12/14   Mosie Lukes, MD  cholestyramine (QUESTRAN) 4 g packet MIX 1 PACKET (4 GRAM TOTAL) IN LIQUID AND DRINK DAILY AS NEEDED FOR DIARRHEA 11/20/15   Mosie Lukes, MD  clonazePAM (KLONOPIN) 1 MG tablet Take 1 tablet (1 mg total) by mouth 2 (two) times daily as needed for anxiety. 01/22/16   Barton Dubois, MD  clopidogrel (PLAVIX) 75 MG tablet TAKE 1 TABLET DAILY 10/18/15   Mosie Lukes, MD  desvenlafaxine (PRISTIQ) 100 MG 24 hr tablet Take 100 mg by mouth daily.    Historical Provider, MD  fluticasone (FLONASE) 50 MCG/ACT nasal spray USE 2 SPRAYS IN EACH NOSTRIL DAILY AS NEEDED FOR ALLERGIES 10/18/15   Mosie Lukes, MD  formoterol (PERFOROMIST) 20 MCG/2ML nebulizer solution Take 2 mLs (20 mcg total) by nebulization 2 (two) times daily. 01/14/15   Elsie Stain, MD  glucose blood (BAYER CONTOUR NEXT TEST) test strip Use as directed once daily to check blood sugar.  DX E11.9 02/18/16   Mosie Lukes, MD  hydrocortisone (ANUSOL-HC) 25 MG  suppository Place 25 mg rectally daily as needed.    Historical Provider, MD  KLOR-CON M10 10 MEQ tablet TAKE 1 TABLET TWICE A DAY 02/12/16   Mosie Lukes, MD  Magnesium 400 MG TABS Take 400 mg by mouth 2 (two) times daily. 02/25/16   Mosie Lukes, MD  magnesium oxide (MAG-OX) 400 MG tablet Take 400 mg by mouth 2 (two) times daily. Take 400 mg BID x4 days, then qd.    Historical Provider, MD  memantine [facility] TITRATION PAK) tablet pack 5 mg/day for =1 week; 5 mg twice daily for =1 week; 15 mg/day given in 5 mg and 10 mg separated doses for =1 week; then 10 mg twice daily 02/13/16   [name], MD  montelukast (  SINGULAIR) 10 MG tablet Take 10 mg by mouth at bedtime.    Historical Provider, MD  Multiple Vitamins-Minerals (OCUVITE PRESERVISION) TABS Take by mouth 2 (two) times daily.    Historical Provider, MD  ondansetron (ZOFRAN) 4 MG tablet Take 1 tablet (4 mg total) by mouth every 6 (six) hours as needed for nausea or vomiting. 03/14/15   Lori P Hvozdovic, PA-C  pantoprazole (PROTONIX) 40 MG tablet Take 40 mg by mouth daily.    Historical Provider, MD  pantoprazole (PROTONIX) 40 MG tablet TAKE 1 TABLET EVERY MORNING 02/19/16   Irene Shipper, MD  phenytoin (DILANTIN) 100 MG ER capsule Take 2 capsules (200 mg total) by mouth at bedtime. Reported on 02/13/2016 02/13/16   [name], MD  Polyethyl Glycol-Propyl Glycol (SYSTANE) 0.4-0.3 % SOLN Place 1 drop into both eyes 2 (two) times daily as needed (dry eyes).     Historical Provider, MD  potassium chloride (K-DUR) 10 MEQ tablet Take 10 mEq by mouth 2 (two) times daily.    Historical Provider, MD  Protein (PROCEL) POWD Take 2 scoop by mouth 2 (two) times daily.    Historical Provider, MD  saccharomyces boulardii (FLORASTOR) 250 MG capsule Take 250 mg by mouth daily.    Historical Provider, MD  sodium chloride (OCEAN) 0.65 % SOLN nasal spray Place 2 sprays into both nostrils daily as needed for congestion.     Historical Provider, MD  traMADol  (ULTRAM) 50 MG tablet Take 1 tablet (50 mg total) by mouth every 12 (twelve) hours as needed for severe pain. 01/22/16   Barton Dubois, MD  UNABLE TO FIND Med Name: Med pass 120 mL 3 times daily between meals for nutritional supplement    Historical Provider, MD   BP 158/63 mmHg  Pulse 83  Temp(Src) 98.7 F (37.1 C) (Oral)  Resp 15  SpO2 94% Physical Exam  Constitutional: She appears well-developed and well-nourished. No distress.  HENT:  Head: Normocephalic and atraumatic.  Mouth/Throat: Oropharynx is clear and moist.  Eyes: Conjunctivae and EOM are normal. Pupils are equal, round, and reactive to light.  Neck: Normal range of motion. Neck supple.  Cardiovascular: Normal rate, regular rhythm, normal heart sounds and intact distal pulses.   Pulmonary/Chest: Effort normal and breath sounds normal. No respiratory distress.  Abdominal: Soft. There is no tenderness. There is no guarding.  Musculoskeletal: She exhibits no edema or tenderness.  Lymphadenopathy:    She has no cervical adenopathy.  Neurological: She is alert. She has normal reflexes.  No sensory deficits. Strength 5/5 in all extremities. Coordination intact. Cranial nerves III-XII grossly intact. No facial droop. Pt could answer who she is and identify her daughter at the bedside, but could not say where she is, her birthday or that of her daughter, and does not know why she is here. When asked to perform some tasks, would just stare at me and not move, as if I had not just asked her to do something. When asked if she was having trouble hearing me, pt said "no."   Skin: Skin is warm and dry. She is not diaphoretic.  Full skin exam reveals no lesions, rashes, infections, or any other abnormalities.  Psychiatric: She has a normal mood and affect. Her behavior is normal. Cognition and memory are impaired. She exhibits abnormal recent memory and abnormal remote memory.  Nursing note and vitals reviewed.   ED Course  Procedures  (including critical care time) Labs Review Labs Reviewed  CBC WITH DIFFERENTIAL/PLATELET -  Abnormal; Notable for the following:    RBC 3.57 (*)    Hemoglobin 11.1 (*)    HCT 34.4 (*)    RDW 16.7 (*)    Platelets 112 (*)    All other components within normal limits  COMPREHENSIVE METABOLIC PANEL - Abnormal; Notable for the following:    Glucose, Bld 114 (*)    BUN 22 (*)    Total Protein 6.4 (*)    Albumin 3.0 (*)    AST 59 (*)    All other components within normal limits  AMMONIA - Abnormal; Notable for the following:    Ammonia 99 (*)    All other components within normal limits  TROPONIN I - Abnormal; Notable for the following:    Troponin I 0.06 (*)    All other components within normal limits  MAGNESIUM - Abnormal; Notable for the following:    Magnesium 1.4 (*)    All other components within normal limits  PHENYTOIN LEVEL, TOTAL - Abnormal; Notable for the following:    Phenytoin Lvl 3.0 (*)    All other components within normal limits  CBG MONITORING, ED - Abnormal; Notable for the following:    Glucose-Capillary 108 (*)    All other components within normal limits  I-STAT CG4 LACTIC ACID, ED - Abnormal; Notable for the following:    Lactic Acid, Venous 2.42 (*)    All other components within normal limits  CULTURE, BLOOD (ROUTINE X 2)  CULTURE, BLOOD (ROUTINE X 2)  URINALYSIS, ROUTINE W REFLEX MICROSCOPIC (NOT AT [facility])    HEMOGLOBIN  Date Value Ref Range Status  02/27/2016 11.1* 12.0 - 15.0 g/dL Final  02/03/2016 11.1* 12.0 - 16.0 g/dL Final  01/27/2016 10.7* 12.0 - 16.0 g/dL Final  01/22/2016 10.2* 12.0 - 15.0 g/dL Final   HGB  Date Value Ref Range Status  02/04/2016 11.2* 11.6 - 15.9 g/dL Final  12/26/2015 11.4* 11.6 - 15.9 g/dL Final  11/11/2015 9.9* 11.6 - 15.9 g/dL Final  09/10/2015 9.5* 11.6 - 15.9 g/dL Final  02/01/2008 13.6 11.6 - 15.9 g/dL Final  01/27/2008 14.4 11.6 - 15.9 g/dL Final  10/26/2007 14.4 11.6 - 15.9 g/dL Final  08/03/2007 14.3 11.6  - 15.9 g/dL Final     Imaging Review Dg Chest 2 View  02/27/2016  CLINICAL DATA:  Worsening mental status over the last 2 days. History of diabetes, aortic stenosis, transient ischemic attacks and seizures. EXAM: CHEST  2 VIEW COMPARISON:  01/18/2016 and 05/25/2015. FINDINGS: The heart size and mediastinal contours are stable with mild aortic atherosclerosis. Mildly increased interstitial prominence is noted, best seen on the lateral view. There is no confluent airspace opacity or significant pleural effusion. Underlying emphysematous changes are present. The bones appear unchanged. Telemetry leads overlie the chest. IMPRESSION: Mildly increased interstitial prominence compared with most recent prior studies, suspicious for mild edema superimposed on emphysema. No focal airspace disease. Electronically Signed   By: Richardean Sale M.D.   On: 02/27/2016 15:11   Ct Head Wo Contrast  02/27/2016  CLINICAL DATA:  Worsening altered mental status over the past 2 days. Hypertension. EXAM: CT HEAD WITHOUT CONTRAST TECHNIQUE: Contiguous axial images were obtained from the base of the skull through the vertex without intravenous contrast. COMPARISON:  Head CT dated 01/18/2016 and brain MR dated 01/19/2016. FINDINGS: Diffusely enlarged ventricles and subarachnoid spaces. Patchy white matter low density in both cerebral hemispheres. No intracranial hemorrhage, mass lesion or CT evidence of acute infarction. Unremarkable bones and included paranasal  sinuses. IMPRESSION: 1. No acute abnormality. 2. Stable atrophy and chronic small vessel white matter ischemic changes. Electronically Signed   By: Claudie Revering M.D.   On: 02/27/2016 15:12    I have personally reviewed and evaluated these images and lab results as part of my medical decision-making.   EKG Interpretation   Date/Time:  Thursday Feb 27 2016 14:32:04 EDT Ventricular Rate:  86 PR Interval:    QRS Duration: 94 QT Interval:  395 QTC Calculation: 472 R  Axis:   -7 Text Interpretation:  Sinus rhythm Baseline wander Interpretation limited  secondary to artifact Borderline repolarization abnormality Confirmed by  Hazle Coca [phone]) on 02/27/2016 2:40:02 PM      Medications  magnesium sulfate (IV Push/IM) injection 1 g (1 g Intravenous Given 02/27/16 1643)   Orders Placed This Encounter  Procedures  . Culture, blood (routine x 2)  . DG Chest 2 View  . CT Head Wo Contrast  . CBC with Differential  . Comprehensive metabolic panel  . Urinalysis, Routine w reflex microscopic  . Ammonia  . Troponin I  . Magnesium  . Phenytoin level, total  . Cardiac monitoring  . Consult to hospitalist  [facility]  . Pulse oximetry, continuous  . CBG monitoring, ED  . I-Stat CG4 Lactic Acid, ED  . EKG 12-Lead  . Insert peripheral IV    MDM   Final diagnoses:  Confusion  Hypomagnesemia    ELLESON GANUS presents with confusion and abnormal behavior that began this morning.  Findings and plan of care discussed with Fredia Sorrow, MD. Dr. Rogene Houston personally evaluated and examined this patient.  Due to the patient's recent hospital admission with similar symptoms, this may be a recurrence of the same. Altered mental status workup initiated. Admit for observation and continued treatment. CT negative for acute changes. Elevated lactic acid without leukocytosis, tachycardia, or fever.  5:27 PM Spoke with Dr. Karleen Hampshire, Hospitalist, who agreed to admit the patient to telemetry observation at [name].   During the patient's previous admission, patient received a significant workup, including head CT, brain MRI, EEG, as well as treatment for hypomagnesemia. Head CT showed no acute abnormalities. MRI with chronic hemorrhagic changes, presumed to be due to hypertensive damage. Patient was placed on Norvasc due to these findings, but daughter states patient has never had problems with hypertension. Removed from Norvasc due to 2 episodes of  hypotension.  Filed Vitals:   02/27/16 1420 02/27/16 1503 02/27/16 1530  BP: 158/63 138/55 157/55  Pulse: 83 77 77  Temp: 98.7 F (37.1 C)    TempSrc: Oral    Resp: 15 24 21   SpO2: 94% 97% 96%   Filed Vitals:   02/27/16 1503 02/27/16 1530 02/27/16 1600 02/27/16 1630  BP: 138/55 157/55 161/63 152/70  Pulse: 77 77 77 81  Temp:      TempSrc:      Resp: 24 21 22 23   SpO2: 97% 96% 97% 94%     Lorayne Bender, PA-C 02/28/16 2242

## 2016-02-27 NOTE — Telephone Encounter (Signed)
Signed forms faxed to [facility] successfully. Sent for scanning. JG/CMA 

## 2016-02-27 NOTE — ED Notes (Signed)
PA at bedside.

## 2016-02-27 NOTE — ED Provider Notes (Signed)
Medical screening examination/treatment/procedure(s) were conducted as a shared visit with non-physician practitioner(s) and myself.  I personally evaluated the patient during the encounter.   EKG Interpretation   Date/Time:  Thursday Feb 27 2016 14:32:04 EDT Ventricular Rate:  86 PR Interval:    QRS Duration: 94 QT Interval:  395 QTC Calculation: 472 R Axis:   -7 Text Interpretation:  Sinus rhythm Baseline wander Interpretation limited  secondary to artifact Borderline repolarization abnormality Confirmed by  Hazle Coca [phone]) on 02/27/2016 2:40:02 PM      Results for orders placed or performed during the hospital encounter of 02/27/16  CBC with Differential  Result Value Ref Range   WBC 4.4 4.0 - 10.5 K/uL   RBC 3.57 (L) 3.87 - 5.11 MIL/uL   Hemoglobin 11.1 (L) 12.0 - 15.0 g/dL   HCT 34.4 (L) 36.0 - 46.0 %   MCV 96.4 78.0 - 100.0 fL   MCH 31.1 26.0 - 34.0 pg   MCHC 32.3 30.0 - 36.0 g/dL   RDW 16.7 (H) 11.5 - 15.5 %   Platelets 112 (L) 150 - 400 K/uL   Neutrophils Relative % 66 %   Lymphocytes Relative 18 %   Monocytes Relative 11 %   Eosinophils Relative 4 %   Basophils Relative 1 %   Neutro Abs 2.9 1.7 - 7.7 K/uL   Lymphs Abs 0.8 0.7 - 4.0 K/uL   Monocytes Absolute 0.5 0.1 - 1.0 K/uL   Eosinophils Absolute 0.2 0.0 - 0.7 K/uL   Basophils Absolute 0.0 0.0 - 0.1 K/uL   Smear Review LARGE PLATELETS PRESENT   Comprehensive metabolic panel  Result Value Ref Range   Sodium 140 135 - 145 mmol/L   Potassium 3.9 3.5 - 5.1 mmol/L   Chloride 107 101 - 111 mmol/L   CO2 25 22 - 32 mmol/L   Glucose, Bld 114 (H) 65 - 99 mg/dL   BUN 22 (H) 6 - 20 mg/dL   Creatinine, Ser 0.85 0.44 - 1.00 mg/dL   Calcium 8.9 8.9 - 10.3 mg/dL   Total Protein 6.4 (L) 6.5 - 8.1 g/dL   Albumin 3.0 (L) 3.5 - 5.0 g/dL   AST 59 (H) 15 - 41 U/L   ALT 27 14 - 54 U/L   Alkaline Phosphatase 107 38 - 126 U/L   Total Bilirubin 0.6 0.3 - 1.2 mg/dL   GFR calc non Af Amer >60 >60 mL/min   GFR calc Af Amer  >60 >60 mL/min   Anion gap 8 5 - 15  Urinalysis, Routine w reflex microscopic  Result Value Ref Range   Color, Urine YELLOW YELLOW   APPearance CLEAR CLEAR   Specific Gravity, Urine 1.016 1.005 - 1.030   pH 7.5 5.0 - 8.0   Glucose, UA NEGATIVE NEGATIVE mg/dL   Hgb urine dipstick NEGATIVE NEGATIVE   Bilirubin Urine NEGATIVE NEGATIVE   Ketones, ur NEGATIVE NEGATIVE mg/dL   Protein, ur NEGATIVE NEGATIVE mg/dL   Nitrite NEGATIVE NEGATIVE   Leukocytes, UA NEGATIVE NEGATIVE  Ammonia  Result Value Ref Range   Ammonia 99 (H) 9 - 35 umol/L  Troponin I  Result Value Ref Range   Troponin I 0.06 (H) <0.031 ng/mL  Magnesium  Result Value Ref Range   Magnesium 1.4 (L) 1.7 - 2.4 mg/dL  Phenytoin level, total  Result Value Ref Range   Phenytoin Lvl 3.0 (L) 10.0 - 20.0 ug/mL  CBG monitoring, ED  Result Value Ref Range   Glucose-Capillary 108 (H) 65 - 99  mg/dL  I-Stat CG4 Lactic Acid, ED  Result Value Ref Range   Lactic Acid, Venous 2.42 (HH) 0.5 - 2.0 mmol/L   Comment NOTIFIED PHYSICIAN    *Note: Due to a large number of results and/or encounters for the requested time period, some results have not been displayed. A complete set of results can be found in Results Review.   Dg Chest 2 View  02/27/2016  CLINICAL DATA:  Worsening mental status over the last 2 days. History of diabetes, aortic stenosis, transient ischemic attacks and seizures. EXAM: CHEST  2 VIEW COMPARISON:  01/18/2016 and 05/25/2015. FINDINGS: The heart size and mediastinal contours are stable with mild aortic atherosclerosis. Mildly increased interstitial prominence is noted, best seen on the lateral view. There is no confluent airspace opacity or significant pleural effusion. Underlying emphysematous changes are present. The bones appear unchanged. Telemetry leads overlie the chest. IMPRESSION: Mildly increased interstitial prominence compared with most recent prior studies, suspicious for mild edema superimposed on emphysema.  No focal airspace disease. Electronically Signed   By: Richardean Sale M.D.   On: 02/27/2016 15:11   Ct Head Wo Contrast  02/27/2016  CLINICAL DATA:  Worsening altered mental status over the past 2 days. Hypertension. EXAM: CT HEAD WITHOUT CONTRAST TECHNIQUE: Contiguous axial images were obtained from the base of the skull through the vertex without intravenous contrast. COMPARISON:  Head CT dated 01/18/2016 and brain MR dated 01/19/2016. FINDINGS: Diffusely enlarged ventricles and subarachnoid spaces. Patchy white matter low density in both cerebral hemispheres. No intracranial hemorrhage, mass lesion or CT evidence of acute infarction. Unremarkable bones and included paranasal sinuses. IMPRESSION: 1. No acute abnormality. 2. Stable atrophy and chronic small vessel white matter ischemic changes. Electronically Signed   By: Claudie Revering M.D.   On: 02/27/2016 15:12    Patient seen by me. Patient brought in by family for altered mental status. Very similar to her admission in April she was hospitalized April 15 to April 20 and then went to rehabilitation. Data rehabilitation about 2 weeks. Patient with extensive workup at that time to include CT head MRI EEG and the replacement of her low magnesium levels. Patient presents again today with slightly low magnesium not clear whether this is related to her mental status changes. Rest of her workup without any acute findings. Neurology was planning on repeating her EEG and had a repeat MRI scheduled. These perhaps could be done during this hospitalization. We'll go ahead and replace her magnesium here starting with 1 g of mag sulfate. Also her troponin was slightly elevated. She'll require admission and serial cardiac enzymes to make sure there has not been a silent MI.  Patient's lactic acid was elevated in the 2-3 range. However there is no leukocytosis there is no sign findings consistent with sepsis. Blood cultures have been sent. Chest x-ray also negative for  any evidence of an acute infection.  Patient on exam is awake and will follow commands for her daughter. Patient is somewhat hard of hearing. There is no obvious local deficits on exam.  Fredia Sorrow, MD 02/27/16 [phone]

## 2016-02-27 NOTE — H&P (Signed)
History and Physical    Monique Fleming [id number] DOB: Jan 10, 1929 DOA: 02/27/2016  Referring MD/NP/PA: Transfer from [facility] PCP: Penni Homans, MD  Patient coming from: Home   Chief Complaint: Altered mental status  HPI: Monique Fleming is a 82 y.o. female with medical history significant of diabetes mellitus type 2, HTN, COPD, anxiety/depression, aortic stenosis, and mild cognitive impairment; who presents after being found acutely altered. History obtained from the patient's daughters as patient is currently unable to provide her own history she is acutely altered. Patient was noted to be normal early this morning when talking to her daughter on the phone. However, around 10:30 AM when speech therapy came patient was noted to be acting strangely and could not figure out how to open her [employer]. Thereafter physical therapy came by the patient still noted to be acting very strangely and recommended that the patient be taken to the emergency room. At that time but patient's blood pressure was noted to be elevated One of the patient's daughters came by to check on her to take her to be evaluated and patient was noted to be unable to even place her shoes on. Family notes no focal deficits. Family notes that she regress back to when she had to be admitted into the hospital in 01/2016 with similar symptoms of being altered. She was evaluated and the questioned to have TIA/brain hemorrhage. The workup was relatively unremarkable except noted to have low magnesium levels at that time for which they were replaced. Patient then went to inpatient rehabilitation at [facility] and subsequently was discharged home with PT/OT/speech. Family made sure that once the patient was discharged home that she had 24 hour care and had been receiving outpatient therapies and improving back near her baseline. She notes that PT/OT/speech were all about  discharge her from their services when she apparently had  symptoms today seen above. Family also notes that she was seen by her neurologist Dr. [name] on her discharge from the hospital in April and it was questioned if she had moderate dementia versus seizure. She was following with Dr. [name] for history of carotid artery stenosis. Family questioned this diagnosis and obtained a second opinion neurologist Dr. Elsie Ra  at [facility] and diagnosed her with mild cognitive impairment just last week. My notes that she only utilizes nasal cannula oxygen at night, has not on this 24/7. Family states that she's been eating better and her weight is up approximately 5 pounds. Family also makes note of a previous history of being told at some point multiple years ago that the patient had cirrhosis of the liver. Patient denies having any complaints of chest pain or shortness breath.  ED Course:   Upon admission to the emergency department patient was reevaluated and seen to be afebrile, respirations up to 24, blood pressure as high as 166/60, and O2 sats maintained. Labwork revealed a WBC of 4.4, hemoglobin 11.1, platelets 112, BUN 22, creatinine 0.85, troponin of 0.06.    Review of Systems: As per HPI otherwise 10 point review of systems negative although this is not totally reliable as the patient is acutely altered.   Past Medical History  Diagnosis Date  . Aortic stenosis     moderate by echo 6/11 gradient 20/61mmHg for mean and peak  . TIA (transient ischemic attack)     Dr. [name]  . Peripheral vascular disease ([place])   . IBS (irritable bowel syndrome)   . Diabetes mellitus  type II  . Chronic bronchitis   . COPD (chronic obstructive pulmonary disease) ([place])   . Atherosclerosis   . Depression   . Anxiety     Dr Casimiro Needle  . Renal cyst   . Anemia     iron deficiency  . Cellulitis   . Diverticular disease   . Infectious diarrhea(009.2)   . Sinusitis   . Thrush   . Abnormal liver function test   . GERD (gastroesophageal reflux  disease)   . Insomnia     chronic  . Adenocarcinoma ([place]) 2008    colon,s/p right hemicolectomy  . Angina   . Seizures (HCC)     hx of one seizure "  . Cirrhosis of liver (HCC)     hx of  . Clostridium difficile diarrhea   . Thrombocytopenia ([place]) 04/01/2013  . Bursitis     spine per pt  . Macular degeneration 02/18/2014    Follows with [facility]  . Internal hemorrhoids   . Asthma   . Elevated BP 07/09/2015  . Type 2 diabetes mellitus with vascular disease ([place]) 03/02/2007    Chronic    . Encephalopathy, metabolic   . Protein calorie malnutrition [facility])     Past Surgical History  Procedure Laterality Date  . Carotid endarterectomy    . Colectomy      right hemi  . Cholecystectomy  11/09  . Upper gastrointestinal endoscopy    . Tonsillectomy    . Appendectomy       reports that she quit smoking about 4 weeks ago. Her smoking use included Cigarettes. She started smoking about 65 years ago. She has a 35 pack-year smoking history. She has never used smokeless tobacco. She reports that she does not drink alcohol or use illicit drugs.  Allergies  Allergen Reactions  . Bactrim [Sulfamethoxazole-Trimethoprim] Shortness Of Breath  . Levofloxacin Other (See Comments)    seizure  . Pneumovax [Pneumococcal Polysaccharide Vaccine] Other (See Comments)    Severe allergy-cellulitis  . Amoxicillin-Pot Clavulanate Other (See Comments)    Patient stated Drs. recommend taking Augmentin due to her PCN allergy.  . Penicillins Rash    Has patient had a PCN reaction causing immediate rash, facial/tongue/throat swelling, SOB or lightheadedness with hypotension: No Has patient had a PCN reaction causing severe rash involving mucus membranes or skin necrosis: No Has patient had a PCN reaction that required hospitalization No Has patient had a PCN reaction occurring within the last 10 years: No If all of the above answers are "NO", then may proceed with Cephalosporin use.   . Bupropion   .  Haldol [Haloperidol Lactate]     Had unknown reaction many yrs ago per pt  . Haloperidol   . Morphine   . Morphine And Related     Confused and agitation  . Pneumococcal Vaccine   . Pseudoephedrine Hcl   . Sudafed [Pseudoephedrine Hcl] Other (See Comments)    seizure    Family History  Problem Relation Age of Onset  . Colon cancer Paternal Grandmother   . Esophageal cancer Brother   . Heart disease Mother   . Diabetes Brother   . Hypertension Father   . Rectal cancer Neg Hx   . Stomach cancer Neg Hx     Prior to Admission medications   Medication Sig Start Date End Date Taking? Authorizing Provider  magnesium oxide (MAG-OX) 400 MG tablet Take 800 mg by mouth 2 (two) times daily.   Yes Historical Provider, MD  albuterol (PROAIR HFA) 108 (90 BASE) MCG/ACT inhaler Inhale 2 puffs into the lungs every 4 (four) hours as needed for wheezing or shortness of breath. 11/12/14   Mosie Lukes, MD  cholestyramine (QUESTRAN) 4 g packet MIX 1 PACKET (4 GRAM TOTAL) IN LIQUID AND DRINK DAILY AS NEEDED FOR DIARRHEA 11/20/15   Mosie Lukes, MD  clonazePAM (KLONOPIN) 1 MG tablet Take 1 tablet (1 mg total) by mouth 2 (two) times daily as needed for anxiety. 01/22/16   Barton Dubois, MD  clopidogrel (PLAVIX) 75 MG tablet TAKE 1 TABLET DAILY 10/18/15   Mosie Lukes, MD  desvenlafaxine (PRISTIQ) 100 MG 24 hr tablet Take 100 mg by mouth daily.    Historical Provider, MD  fluticasone (FLONASE) 50 MCG/ACT nasal spray USE 2 SPRAYS IN EACH NOSTRIL DAILY AS NEEDED FOR ALLERGIES 10/18/15   Mosie Lukes, MD  formoterol (PERFOROMIST) 20 MCG/2ML nebulizer solution Take 2 mLs (20 mcg total) by nebulization 2 (two) times daily. 01/14/15   Elsie Stain, MD  glucose blood (BAYER CONTOUR NEXT TEST) test strip Use as directed once daily to check blood sugar.  DX E11.9 02/18/16   Mosie Lukes, MD  hydrocortisone (ANUSOL-HC) 25 MG suppository Place 25 mg rectally daily as needed.    Historical Provider, MD  KLOR-CON  M10 10 MEQ tablet TAKE 1 TABLET TWICE A DAY 02/12/16   Mosie Lukes, MD  Magnesium 400 MG TABS Take 400 mg by mouth 2 (two) times daily. 02/25/16   Mosie Lukes, MD  magnesium oxide (MAG-OX) 400 MG tablet Take 400 mg by mouth 2 (two) times daily. Take 400 mg BID x4 days, then qd.    Historical Provider, MD  memantine [facility] TITRATION PAK) tablet pack 5 mg/day for =1 week; 5 mg twice daily for =1 week; 15 mg/day given in 5 mg and 10 mg separated doses for =1 week; then 10 mg twice daily 02/13/16   [name], MD  montelukast (SINGULAIR) 10 MG tablet Take 10 mg by mouth at bedtime.    Historical Provider, MD  Multiple Vitamins-Minerals (OCUVITE PRESERVISION) TABS Take by mouth 2 (two) times daily.    Historical Provider, MD  ondansetron (ZOFRAN) 4 MG tablet Take 1 tablet (4 mg total) by mouth every 6 (six) hours as needed for nausea or vomiting. 03/14/15   Lori P Hvozdovic, PA-C  pantoprazole (PROTONIX) 40 MG tablet Take 40 mg by mouth daily.    Historical Provider, MD  pantoprazole (PROTONIX) 40 MG tablet TAKE 1 TABLET EVERY MORNING 02/19/16   Irene Shipper, MD  phenytoin (DILANTIN) 100 MG ER capsule Take 2 capsules (200 mg total) by mouth at bedtime. Reported on 02/13/2016 02/13/16   [name], MD  Polyethyl Glycol-Propyl Glycol (SYSTANE) 0.4-0.3 % SOLN Place 1 drop into both eyes 2 (two) times daily as needed (dry eyes).     Historical Provider, MD  potassium chloride (K-DUR) 10 MEQ tablet Take 10 mEq by mouth 2 (two) times daily.    Historical Provider, MD  Protein (PROCEL) POWD Take 2 scoop by mouth 2 (two) times daily.    Historical Provider, MD  saccharomyces boulardii (FLORASTOR) 250 MG capsule Take 250 mg by mouth daily.    Historical Provider, MD  sodium chloride (OCEAN) 0.65 % SOLN nasal spray Place 2 sprays into both nostrils daily as needed for congestion.     Historical Provider, MD  traMADol (ULTRAM) 50 MG tablet Take 1 tablet (50 mg total)  by mouth every 12 (twelve) hours as needed  for severe pain. 01/22/16   Barton Dubois, MD  UNABLE TO FIND Med Name: Med pass 120 mL 3 times daily between meals for nutritional supplement    Historical Provider, MD    Physical Exam: Filed Vitals:   02/27/16 1700 02/27/16 1730 02/27/16 1809 02/27/16 1857  BP: 131/52 155/58 166/60 159/53  Pulse: 72 80 81 79  Temp:   98.5 F (36.9 C) 98.6 F (37 C)  TempSrc:    Oral  Resp: 18 22 23 18   Height:    5\' 4"  (1.626 m)  Weight:    58.8 kg (129 lb 10.1 oz)  SpO2: 97% 95% 94% 97%      Constitutional: Elderly female who is pleasantly confused, in no acute distress, and able to follow simple commands Filed Vitals:   02/27/16 1700 02/27/16 1730 02/27/16 1809 02/27/16 1857  BP: 131/52 155/58 166/60 159/53  Pulse: 72 80 81 79  Temp:   98.5 F (36.9 C) 98.6 F (37 C)  TempSrc:    Oral  Resp: 18 22 23 18   Height:    5\' 4"  (1.626 m)  Weight:    58.8 kg (129 lb 10.1 oz)  SpO2: 97% 95% 94% 97%   Eyes: PERRL, lids and conjunctivae normal ENMT: Mucous membranes are moist. Posterior pharynx clear of any exudate or lesions.Normal dentition.  Neck: normal, supple, no masses, no thyromegaly Respiratory:  Scattered crackles appreciated bilaterally, no wheezing, no crackles. Normal respiratory effort. No accessory muscle use.  Cardiovascular: Regular rate and rhythm, positive 2/6 SEM / rubs / gallops. No extremity edema. 2+ pedal pulses. No carotid bruits.  Abdomen: no tenderness, no masses palpated. No hepatosplenomegaly. Bowel sounds positive.  Musculoskeletal: no clubbing / cyanosis. No joint deformity upper and lower extremities. Good ROM, no contractures. Normal muscle tone.  Skin: no rashes, lesions, ulcers. No induration Neurologic: CN 2-12 grossly intact. Sensation intact, DTR normal. Strength 5/5 in all 4.  Psychiatric: . Alert, but confused unable to stay full name and date of birth at this time. Normal mood.     Labs on Admission: I have personally reviewed following labs and  imaging studies  CBC:  Recent Labs Lab 02/27/16 1520  WBC 4.4  NEUTROABS 2.9  HGB 11.1*  HCT 34.4*  MCV 96.4  PLT [id number]*   Basic Metabolic Panel:  Recent Labs Lab 02/27/16 1520  NA 140  K 3.9  CL 107  CO2 25  GLUCOSE 114*  BUN 22*  CREATININE 0.85  CALCIUM 8.9  MG 1.4*   GFR: Estimated Creatinine Clearance: 42.5 mL/min (by C-G formula based on Cr of 0.85). Liver Function Tests:  Recent Labs Lab 02/27/16 1520  AST 59*  ALT 27  ALKPHOS 107  BILITOT 0.6  PROT 6.4*  ALBUMIN 3.0*   No results for input(s): LIPASE, AMYLASE in the last 168 hours.  Recent Labs Lab 02/27/16 1520  AMMONIA 99*   Coagulation Profile: No results for input(s): INR, PROTIME in the last 168 hours. Cardiac Enzymes:  Recent Labs Lab 02/27/16 1520  TROPONINI 0.06*   BNP (last 3 results) No results for input(s): PROBNP in the last 8760 hours. HbA1C: No results for input(s): HGBA1C in the last 72 hours. CBG:  Recent Labs Lab 02/27/16 1521  GLUCAP 108*   Lipid Profile: No results for input(s): CHOL, HDL, LDLCALC, TRIG, CHOLHDL, LDLDIRECT in the last 72 hours. Thyroid Function Tests: No results for input(s): TSH, T4TOTAL, FREET4, T3FREE, THYROIDAB  in the last 72 hours. Anemia Panel: No results for input(s): VITAMINB12, FOLATE, FERRITIN, TIBC, IRON, RETICCTPCT in the last 72 hours. Urine analysis:    Component Value Date/Time   COLORURINE YELLOW 02/27/2016 [place] 02/27/2016 1514   LABSPEC 1.016 02/27/2016 1514   PHURINE 7.5 02/27/2016 1514   GLUCOSEU NEGATIVE 02/27/2016 1514   GLUCOSEU NEGATIVE 08/13/2011 0846   HGBUR NEGATIVE 02/27/2016 1514   HGBUR negative 02/04/2009 1309   BILIRUBINUR NEGATIVE 02/27/2016 1514   BILIRUBINUR Neg 02/15/2015 1145   KETONESUR NEGATIVE 02/27/2016 1514   PROTEINUR NEGATIVE 02/27/2016 1514   PROTEINUR 30 02/15/2015 1145   UROBILINOGEN 1.0 05/25/2015 1757   UROBILINOGEN 0.2 02/15/2015 1145   NITRITE NEGATIVE 02/27/2016  1514   NITRITE Neg 02/15/2015 1145   LEUKOCYTESUR NEGATIVE 02/27/2016 1514   Sepsis Labs: No results found for this or any previous visit (from the past 240 hour(s)).   Radiological Exams on Admission: Dg Chest 2 View  02/27/2016  CLINICAL DATA:  Worsening mental status over the last 2 days. History of diabetes, aortic stenosis, transient ischemic attacks and seizures. EXAM: CHEST  2 VIEW COMPARISON:  01/18/2016 and 05/25/2015. FINDINGS: The heart size and mediastinal contours are stable with mild aortic atherosclerosis. Mildly increased interstitial prominence is noted, best seen on the lateral view. There is no confluent airspace opacity or significant pleural effusion. Underlying emphysematous changes are present. The bones appear unchanged. Telemetry leads overlie the chest. IMPRESSION: Mildly increased interstitial prominence compared with most recent prior studies, suspicious for mild edema superimposed on emphysema. No focal airspace disease. Electronically Signed   By: Richardean Sale M.D.   On: 02/27/2016 15:11   Ct Head Wo Contrast  02/27/2016  CLINICAL DATA:  Worsening altered mental status over the past 2 days. Hypertension. EXAM: CT HEAD WITHOUT CONTRAST TECHNIQUE: Contiguous axial images were obtained from the base of the skull through the vertex without intravenous contrast. COMPARISON:  Head CT dated 01/18/2016 and brain MR dated 01/19/2016. FINDINGS: Diffusely enlarged ventricles and subarachnoid spaces. Patchy white matter low density in both cerebral hemispheres. No intracranial hemorrhage, mass lesion or CT evidence of acute infarction. Unremarkable bones and included paranasal sinuses. IMPRESSION: 1. No acute abnormality. 2. Stable atrophy and chronic small vessel white matter ischemic changes. Electronically Signed   By: Claudie Revering M.D.   On: 02/27/2016 15:12    EKG: Independently reviewed. Sinus rhythm with baseline wandering. No significant ST  changes.  Assessment/Plan Acute hepatic encephalopathy with history of mild cognitive impairment : Patient with inability to do normal tasks that she previously was able to do acutely today . No focal deficits noted on physical exam except for being confused. CT scan of the brain shows no acute abnormalities. Ammonia level noted to be elevated at 99 on admission. This does not appear to have been checked during last hospitalization the patient was noted to have similar symptoms. Question cause cause including hypoxia vs CHF vs med. - Admit to a telemetry bed - Trend ammonia levels - Pharmacy consult for for possible medication cause symptoms - lactulose 30 gm q 8 hr x 3 total doses ordered. Will need to reassess to see if continuation is warranted - check abdominal ultrasound - Neuro checks  - PT/OT/speech to eval and treat in a.m. - reevaluate in a.m. for need of consultative assistance in management of patient  Acute exacerbation congestive heart failure: Family notes a recent 5 pound weight gain which they suspected secondary to improved appetite. Bilateral  crackles noted on physical exam. Chest x-ray showed mild interstitial edema with emphysema. Last echocardiogram showing EF ofof 55% to 60% with normal EF. - Strict ins and outs and daily weights - Lasix 20 mg IV 1 dose now - Reassess in a.m. and continue diuresis as needed - Consider need of cardiology consult in a.m.  COPD without acute exacerbation/ Acute respiratory alkalosis with hypoxemia: Patient's initial ABG revealed a pH of 7.465, serum bicarbonate 25, PCO2 33.4, PO2 72.7. Patient previously only noted to use nasal cannula oxygen of 2 L at night per family. - Continuous pulse oximetry with nasal cannula oxygen of 2 L - Continue Singulair, albuterol neb prn SOB/wheeze, and pharmacy substitution of Brovana  Lactic acidosis: Lactic acid 2.42 on admission. Patient afebrile no acute source of infection noted as chest x-ray shows no  acute infiltrate and UA is negative for any signs of infection. - Follow-up blood cultures  - Trend lactic acid levels - continue to monitor for need of antibiotics, but no indication at this time  Elevated troponin: Troponin initially 0.06 on admission. Patient with reports of chest pain and EKG relatively unremarkable. Likely secondary to acute demand secondary to CHFExacerbation.  - Trend cardiac troponin levels - Recheck EKG in am  Hypomagnesemia: Magnesium level 1.4 on admission. Patient was given 1 g of magnesium sulfate and ED.  - Continue home magnesium oxide dose may need to increase - Continue to monitor and replace as needed    Anemia: Chronic. Hemoglobin 11.1 on admission. This appears to be close to patient's baseline hemoglobin. - Repeat CBC in a.m.  Thrombocytopenia: Chronic. Platelet count noted to be 112 on admission. Patient noted to have similar platelet counts on previous admissions. - Continue to monitor  Diabetes mellitus type 2: Appears well controlled with diet. Last hemoglobin A1c 5.3 on 01/2016 . Patient not seen have any hypoglycemic episodes.  - Continue to monitor and add on sliding scale insulin if needed   History of seizure disorder: Patient was noted to have a grand mal seizure approximately over 6 years ago and started on Dilantin at that time. On admission initial Dilantin level noted to be 3. - Continue Dilantin  - seizure precautions  History of IBS - Continue to monitor  Mild cognitive impairment - Will need to restart Namenda in a.m. once patient's current dose verified   Depression/ anxiety - Continue Pristiq and klonopin   Carotid artery stenosis bilaterally noted to be between 60 and 70% followed by Dr. [name]  - Continue Plavix    DVT prophylaxis: Lovenox  Code Status: DNR  Family Communication: Discuss patient care with one of her daughters present at bedside Disposition plan: Possible discharge home in 2-3 days Consults called:  None Admission status:Obsveration telemetry  Norval Morton MD [facility] Pager [phone]  If 7PM-7AM, please contact night-coverage [email or web address] Password [email or web address]  02/27/2016, 8:12 PM

## 2016-02-27 NOTE — Telephone Encounter (Signed)
Caller name: Tresa Moore, ST with [facility] Can be reached: [phone]   Reason for call: Calling to report high BP of 160/60. She was told to report BP is over 140. She notes pt displaying confusion as well.

## 2016-02-28 ENCOUNTER — Observation Stay (HOSPITAL_COMMUNITY): Payer: Medicare Other

## 2016-02-28 DIAGNOSIS — K7682 Hepatic encephalopathy: Secondary | ICD-10-CM | POA: Diagnosis present

## 2016-02-28 DIAGNOSIS — K7290 Hepatic failure, unspecified without coma: Secondary | ICD-10-CM | POA: Diagnosis present

## 2016-02-28 DIAGNOSIS — E872 Acidosis, unspecified: Secondary | ICD-10-CM | POA: Diagnosis present

## 2016-02-28 DIAGNOSIS — I509 Heart failure, unspecified: Secondary | ICD-10-CM

## 2016-02-28 LAB — MAGNESIUM: Magnesium: 1.5 mg/dL — ABNORMAL LOW (ref 1.7–2.4)

## 2016-02-28 LAB — BASIC METABOLIC PANEL
ANION GAP: 6 (ref 5–15)
BUN: 13 mg/dL (ref 6–20)
CO2: 27 mmol/L (ref 22–32)
CREATININE: 0.79 mg/dL (ref 0.44–1.00)
Calcium: 9.1 mg/dL (ref 8.9–10.3)
Chloride: 108 mmol/L (ref 101–111)
GLUCOSE: 133 mg/dL — AB (ref 65–99)
POTASSIUM: 3.7 mmol/L (ref 3.5–5.1)
SODIUM: 141 mmol/L (ref 135–145)

## 2016-02-28 LAB — CBC
HEMATOCRIT: 33.5 % — AB (ref 36.0–46.0)
HEMOGLOBIN: 10.3 g/dL — AB (ref 12.0–15.0)
MCH: 28.6 pg (ref 26.0–34.0)
MCHC: 30.7 g/dL (ref 30.0–36.0)
MCV: 93.1 fL (ref 78.0–100.0)
Platelets: 133 10*3/uL — ABNORMAL LOW (ref 150–400)
RBC: 3.6 MIL/uL — AB (ref 3.87–5.11)
RDW: 16.6 % — ABNORMAL HIGH (ref 11.5–15.5)
WBC: 6 10*3/uL (ref 4.0–10.5)

## 2016-02-28 LAB — AMMONIA: Ammonia: 90 umol/L — ABNORMAL HIGH (ref 9–35)

## 2016-02-28 LAB — TROPONIN I: TROPONIN I: 0.06 ng/mL — AB (ref <0.031)

## 2016-02-28 MED ORDER — MAGNESIUM SULFATE 50 % IJ SOLN: 3.0000 g | Freq: Once | INTRAMUSCULAR | Status: DC

## 2016-02-28 MED ORDER — MAGNESIUM SULFATE 50 % IJ SOLN
3.0000 g | Freq: Once | INTRAVENOUS | Status: AC
  Administered 2016-02-28: 3 g via INTRAVENOUS
  Filled 2016-02-28: qty 6

## 2016-02-28 NOTE — Care Management Obs Status (Signed)
[place] NOTIFICATION   Patient Details  Name: Monique Fleming MRN: [id number] Date of Birth: Jul 02, 1929   Medicare Observation Status Notification Given:  Yes    Carles Collet, RN 02/28/2016, 11:39 AM

## 2016-02-28 NOTE — Progress Notes (Signed)
SLP Cancellation Note  Patient Details Name: Monique Fleming MRN: [id number] DOB: November 18, 1928   Cancelled treatment:       Reason Eval/Treat Not Completed: Fatigue/lethargy limiting ability to participate. Pt not arousable for cognitive eval at this time.    Hazelgrace Bonham, Katherene Ponto 02/28/2016, 8:35 AM

## 2016-02-28 NOTE — Evaluation (Signed)
Physical Therapy Evaluation Patient Details Name: Monique Fleming MRN: [id number] DOB: Aug 19, 1929 Today's Date: 02/28/2016   History of Present Illness  Monique Fleming is an 82 y.o. female admitted with AMS, dx of hepatic encephalopathy; PMH of seizure disorder, anxiety, type 2 diabetes mellitus, and COPD.   Clinical Impression  Pt admitted with above diagnosis. Pt currently with functional limitations due to the deficits listed below (see PT Problem List). Pt ambulated 120' with supervision due to confusion, no physical assist needed.  Pt will benefit from skilled PT to increase their independence and safety with mobility to allow discharge to the venue listed below.       Follow Up Recommendations Home health PT;Supervision/Assistance - 24 hour (24* supervision due to confusion)    Equipment Recommendations  None recommended by PT    Recommendations for Other Services       Precautions / Restrictions Precautions Precautions: None Precaution Comments: daughter reports no h/o falls in past year Restrictions Weight Bearing Restrictions: No      Mobility  Bed Mobility Overal bed mobility: Independent                Transfers Overall transfer level: Independent                  Ambulation/Gait Ambulation/Gait assistance: supervision due to confusion (needed assist to find her room) Ambulation Distance (Feet): 120 Feet Assistive device: None Gait Pattern/deviations: WFL(Within Functional Limits)   Gait velocity interpretation: at or above normal speed for age/gender General Gait Details: steady without assistive device, needs supervision at present due to confusion, distance limited by fatigue  Stairs            Wheelchair Mobility    Modified Rankin (Stroke Patients Only)       Balance Overall balance assessment: No apparent balance deficits (not formally assessed)                                           Pertinent  Vitals/Pain Pain Assessment: No/denies pain    Home Living Family/patient expects to be discharged to:: Private residence Living Arrangements: Children (for the past month pt had a Actuary and daughters are taking ) Available Help at Discharge: Family;Available 24 hours/day Type of Home: Other(Comment) (townhouse) Home Access: Level entry     Home Layout: Two level;Able to live on main level with bedroom/bathroom Home Equipment: Shower seat;Grab bars - tub/shower      Prior Function Level of Independence: Independent         Comments: independent with mobility without AD, has had 24* supervision at home from family, was receiving HHPT/OT and progressing well     Hand Dominance   Dominant Hand: Right    Extremity/Trunk Assessment   Upper Extremity Assessment: Overall WFL for tasks assessed           Lower Extremity Assessment: Overall WFL for tasks assessed      Cervical / Trunk Assessment: Normal  Communication   Communication: No difficulties  Cognition Arousal/Alertness: Awake/alert Behavior During Therapy: WFL for tasks assessed/performed Overall Cognitive Status: Impaired/Different from baseline Area of Impairment: Memory;Awareness               General Comments: oriented to location, not to year, couldn't state her birthda, able to state name of president with increased time, pt put glasses on incorectly and didn't recognize the  error, daughter reports pt is not at baseline cognitively    General Comments      Exercises        Assessment/Plan    PT Assessment Patient needs continued PT services  PT Diagnosis Altered mental status   PT Problem List Decreased safety awareness;Decreased cognition;Decreased activity tolerance  PT Treatment Interventions Therapeutic exercise;Gait training   PT Goals (Current goals can be found in the Care Plan section) Acute Rehab PT Goals Patient Stated Goal: to prevent weakness while in hospital as pt has been  working hard with HHPT prior to admission and doesn't want to lose strength she's gained -daughter's goal PT Goal Formulation: With patient/family Time For Goal Achievement: 03/13/16 Potential to Achieve Goals: Good    Frequency Min 3X/week   Barriers to discharge        Co-evaluation               End of Session Equipment Utilized During Treatment: Gait belt Activity Tolerance: Patient tolerated treatment well Patient left: in bed;with call bell/phone within reach;with bed alarm set;with family/visitor present Nurse Communication: Mobility status    Functional Assessment Tool Used: clinical judgment Functional Limitation: Mobility: Walking and moving around Mobility: Walking and Moving Around Current Status [phone]): At least 1 percent but less than 20 percent impaired, limited or restricted Mobility: Walking and Moving Around Goal Status [phone]): 0 percent impaired, limited or restricted    Time: 1011-1033 PT Time Calculation (min) (ACUTE ONLY): 22 min   Charges:   PT Evaluation $PT Eval Low Complexity: 1 Procedure     PT G Codes:   PT G-Codes **NOT FOR INPATIENT CLASS** Functional Assessment Tool Used: clinical judgment Functional Limitation: Mobility: Walking and moving around Mobility: Walking and Moving Around Current Status [phone]): At least 1 percent but less than 20 percent impaired, limited or restricted Mobility: Walking and Moving Around Goal Status [phone]): 0 percent impaired, limited or restricted    Monique Fleming 02/28/2016, 10:45 AM [phone]

## 2016-02-28 NOTE — Progress Notes (Signed)
PROGRESS NOTE    Monique Fleming  [id number] DOB: [birth year] DOA: 02/27/2016 PCP: Penni Homans, MD    Brief Narrative: Monique Fleming is a 82 y.o. female with medical history significant of diabetes mellitus type 2, HTN, COPD, anxiety/depression, aortic stenosis, and mild cognitive impairment; who presents after being found acutely altered.    Assessment & Plan:   Principal Problem:   Encephalopathy, hepatic (HCC) Active Problems:   Mild cognitive disorder   Diabetes mellitus type 2, uncomplicated (HCC)   Depression with anxiety   Lactic acidosis   Acute CHF (congestive heart failure) (HCC)   Acute encephalopathy: Probably sec to hepatic encephalopathy.  On lactulose.  Repeat ammonia levels in am.  Get MRI brain and EEG to rule out stroke and seizure.   Diabetes Mellitus: CBG (last 3)   Recent Labs  02/27/16 1521  GLUCAP 108*    Resume SSI.       DVT prophylaxis: lovenox.  Code Status: DNR Family Communication: daughter at bedside.  Disposition Plan: pending PT eval.   Consultants:   none   Procedures: (none   Antimicrobials: none   Subjective: No new complaints.   Objective: Filed Vitals:   02/27/16 2331 02/28/16 0152 02/28/16 0505 02/28/16 1614  BP: 136/64 154/53 143/47 143/54  Pulse: 83 80 85 84  Temp: 98.3 F (36.8 C) 97.6 F (36.4 C) 97.6 F (36.4 C) 98 F (36.7 C)  TempSrc: Oral Oral Oral   Resp: 16 16 16 20   Height:      Weight:   46 kg (101 lb 6.6 oz)   SpO2: 91% 91% 97% 96%    Intake/Output Summary (Last 24 hours) at 02/28/16 1656 Last data filed at 02/28/16 1122  Gross per 24 hour  Intake      0 ml  Output    301 ml  Net   -301 ml   Filed Weights   02/27/16 1857 02/28/16 0505  Weight: 58.8 kg (129 lb 10.1 oz) 46 kg (101 lb 6.6 oz)    Examination:  General exam: Appears calm and comfortable  Respiratory system: Clear to auscultation. Respiratory effort normal. Cardiovascular system: S1 & S2 heard, RRR. No  JVD, murmurs, rubs, gallops or clicks. No pedal edema. Gastrointestinal system: Abdomen is nondistended, soft and nontender. No organomegaly or masses felt. Normal bowel sounds heard. Central nervous system: Alert and  Confused. . No focal neurological deficits. Extremities: Symmetric 5 x 5 power. Skin: No rashes, lesions or ulcers Psychiatry: Judgement and insight appear normal. Mood & affect appropriate.     Data Reviewed: I have personally reviewed following labs and imaging studies  CBC:  Recent Labs Lab 02/27/16 1520 02/28/16 0205  WBC 4.4 6.0  NEUTROABS 2.9  --   HGB 11.1* 10.3*  HCT 34.4* 33.5*  MCV 96.4 93.1  PLT 112* [id number]*   Basic Metabolic Panel:  Recent Labs Lab 02/27/16 1520 02/28/16 0205  NA 140 141  K 3.9 3.7  CL 107 108  CO2 25 27  GLUCOSE 114* 133*  BUN 22* 13  CREATININE 0.85 0.79  CALCIUM 8.9 9.1  MG 1.4* 1.5*   GFR: Estimated Creatinine Clearance: 38 mL/min (by C-G formula based on Cr of 0.79). Liver Function Tests:  Recent Labs Lab 02/27/16 1520  AST 59*  ALT 27  ALKPHOS 107  BILITOT 0.6  PROT 6.4*  ALBUMIN 3.0*   No results for input(s): LIPASE, AMYLASE in the last 168 hours.  Recent Labs Lab 02/27/16 1520 02/28/16 0205  AMMONIA 99* 90*   Coagulation Profile: No results for input(s): INR, PROTIME in the last 168 hours. Cardiac Enzymes:  Recent Labs Lab 02/27/16 1520 02/27/16 2107 02/27/16 2314 02/28/16 0205  TROPONINI 0.06* 0.06* 0.06* 0.06*   BNP (last 3 results) No results for input(s): PROBNP in the last 8760 hours. HbA1C: No results for input(s): HGBA1C in the last 72 hours. CBG:  Recent Labs Lab 02/27/16 1521  GLUCAP 108*   Lipid Profile: No results for input(s): CHOL, HDL, LDLCALC, TRIG, CHOLHDL, LDLDIRECT in the last 72 hours. Thyroid Function Tests: No results for input(s): TSH, T4TOTAL, FREET4, T3FREE, THYROIDAB in the last 72 hours. Anemia Panel: No results for input(s): VITAMINB12, FOLATE,  FERRITIN, TIBC, IRON, RETICCTPCT in the last 72 hours. Sepsis Labs:  Recent Labs Lab 02/27/16 1527 02/27/16 2107 02/27/16 2314  LATICACIDVEN 2.42* 1.4 1.7    Recent Results (from the past 240 hour(s))  Culture, blood (routine x 2)     Status: None (Preliminary result)   Collection Time: 02/27/16  3:49 PM  Result Value Ref Range Status   Specimen Description BLOOD RT HAND  Final   Special Requests BOTTLES DRAWN AEROBIC AND ANAEROBIC 5CC  Final   Culture   Final    NO GROWTH < 24 HOURS Performed at [facility]    Report Status PENDING  Incomplete  Culture, blood (routine x 2)     Status: None (Preliminary result)   Collection Time: 02/27/16  3:50 PM  Result Value Ref Range Status   Specimen Description BLOOD LEFT AC  Final   Special Requests BOTTLES DRAWN AEROBIC AND ANAEROBIC 5CC  Final   Culture   Final    NO GROWTH < 24 HOURS Performed at [facility]    Report Status PENDING  Incomplete         Radiology Studies: Dg Chest 2 View  02/27/2016  CLINICAL DATA:  Worsening mental status over the last 2 days. History of diabetes, aortic stenosis, transient ischemic attacks and seizures. EXAM: CHEST  2 VIEW COMPARISON:  01/18/2016 and 05/25/2015. FINDINGS: The heart size and mediastinal contours are stable with mild aortic atherosclerosis. Mildly increased interstitial prominence is noted, best seen on the lateral view. There is no confluent airspace opacity or significant pleural effusion. Underlying emphysematous changes are present. The bones appear unchanged. Telemetry leads overlie the chest. IMPRESSION: Mildly increased interstitial prominence compared with most recent prior studies, suspicious for mild edema superimposed on emphysema. No focal airspace disease. Electronically Signed   By: Richardean Sale M.D.   On: 02/27/2016 15:11   Ct Head Wo Contrast  02/27/2016  CLINICAL DATA:  Worsening altered mental status over the past 2 days. Hypertension. EXAM: CT  HEAD WITHOUT CONTRAST TECHNIQUE: Contiguous axial images were obtained from the base of the skull through the vertex without intravenous contrast. COMPARISON:  Head CT dated 01/18/2016 and brain MR dated 01/19/2016. FINDINGS: Diffusely enlarged ventricles and subarachnoid spaces. Patchy white matter low density in both cerebral hemispheres. No intracranial hemorrhage, mass lesion or CT evidence of acute infarction. Unremarkable bones and included paranasal sinuses. IMPRESSION: 1. No acute abnormality. 2. Stable atrophy and chronic small vessel white matter ischemic changes. Electronically Signed   By: Claudie Revering M.D.   On: 02/27/2016 15:12   US Abdomen Complete  02/28/2016  CLINICAL DATA:  Hepatic encephalopathy. Cirrhosis. History of diabetes. EXAM: ABDOMEN ULTRASOUND COMPLETE COMPARISON:  08/21/2014 FINDINGS: Gallbladder: Status post cholecystectomy. Common bile duct: Diameter: 4.7 mm Liver: Heterogeneous echotexture. Nodular  surface. Numerous hyperechoic foci throughout the liver parenchyma. No discrete solid or cystic lesions. IVC: No abnormality visualized. Pancreas: Visualized portion unremarkable. Spleen: 13.2 cm.  Volume is 570.6 cubic cm. Right Kidney: Length: 10.3 cm. Echogenicity within normal limits. Midpole cyst is 3.0 x 3.1 x 3.5 cm. Left Kidney: Length: 12.2 cm. Two adjacent cysts are identified. Each contains a single internal septation. These Measure 6.6 x 6.6 x 6.3 cm and 5.0 x 4.9 x 5.4 cm. Abdominal aorta: 3.1 cm distal aspect of the aorta is difficult to evaluate because of patient tolerance. Atherosclerotic calcifications noted. Other findings: Small amount of ascites. IMPRESSION: 1. Cirrhotic changes in the liver. 2. Numerous echogenic foci throughout the liver, consistent with granulomata or fibrosis. 3. Splenomegaly consistent with portal venous hypertension. 4. Status post cholecystectomy. 5. Bilateral renal cysts with benign features. 6. Small amount of ascites. Electronically Signed    By: Nolon Nations M.D.   On: 02/28/2016 10:32        Scheduled Meds: . arformoterol  15 mcg Nebulization Q12H  . clopidogrel  75 mg Oral Daily  . enoxaparin (LOVENOX) injection  40 mg Subcutaneous Q24H  . magnesium oxide  400 mg Oral BID  . montelukast  10 mg Oral QHS  . multivitamin  1 tablet Oral BID  . pantoprazole  40 mg Oral q morning - 10a  . phenytoin  200 mg Oral QHS  . potassium chloride  10 mEq Oral BID   Continuous Infusions:    LOS: 1 day    Time spent: 25 minutes.     Hosie Poisson, MD [facility] Pager [phone]  If 7PM-7AM, please contact night-coverage [email or web address] Password [facility] 02/28/2016, 4:56 PM

## 2016-02-28 NOTE — Procedures (Signed)
History: 82 year old female with suspected hepatic encephalopathy  Sedation: None  Technique: This is a 21 channel routine scalp EEG performed at the bedside with bipolar and monopolar montages arranged in accordance to the international 10/20 system of electrode placement. One channel was dedicated to EKG recording.    Background: The background consists of generalized irregular delta and theta activity, though there is some frontally predominant beta as well. There is a posterior dominant rhythm of 6 Hz. There are occasional triphasic waves seen. Delta increases with drowsiness.  Photic stimulation: Physiologic driving is not performed  EEG Abnormalities: 1) triphasic waves 2) generalized irregular slow activity 3) slow PDR   Clinical Interpretation: This EEG is consistent with a generalized non-specific cerebral dysfunction(encephalopathy). There was no seizure or seizure predisposition recorded on this study.   Roland Rack, MD [facility] [phone]  If 7pm- 7am, please page neurology on call as listed in Story.

## 2016-02-28 NOTE — Care Management Note (Signed)
Case Management Note  Patient Details  Name: Monique Fleming MRN: [id number] Date of Birth: Jan 06, 1929  Subjective/Objective:                 Patient DC from 2 week stay at [facility] through [facility]. Patient with AMS hepatic encephalopathy on lactulose. Patient stays with daughter who was at bedside and provided information. Requested RW. Orders placed by CM for resumption of HH and DME   Action/Plan:  DC to home with [place] [facility] through [facility], referral made. RW through [facility], referral made.  Expected Discharge Date:                  Expected Discharge Plan:  [place]  In-House Referral:     Discharge planning Services  CM Consult  Post Acute Care Choice:  Home Health, Durable Medical Equipment Choice offered to:  Adult Children, Patient  DME Arranged:  [place] [facility] DME Agency:  [place] Arranged:  RN, PT, OT, Nurse's Aide, Speech Therapy HH Agency:   ([place] (resumption))  Status of Service:  Completed, signed off  Medicare Important Message Given:    Date Medicare IM Given:    Medicare IM give by:    Date Additional Medicare IM Given:    Additional Medicare Important Message give by:     If discussed at [place] of Stay Meetings, dates discussed:    Additional Comments:  Carles Collet, RN 02/28/2016, 3:11 PM

## 2016-02-28 NOTE — Telephone Encounter (Signed)
Called [facility] left detailed message of verbal ok 

## 2016-02-28 NOTE — Progress Notes (Signed)
EEG Completed; Results Pending  

## 2016-02-29 DIAGNOSIS — I509 Heart failure, unspecified: Secondary | ICD-10-CM | POA: Diagnosis not present

## 2016-02-29 DIAGNOSIS — K7290 Hepatic failure, unspecified without coma: Secondary | ICD-10-CM | POA: Diagnosis not present

## 2016-02-29 LAB — COMPREHENSIVE METABOLIC PANEL
ALBUMIN: 2.8 g/dL — AB (ref 3.5–5.0)
ALT: 23 U/L (ref 14–54)
AST: 45 U/L — AB (ref 15–41)
Alkaline Phosphatase: 102 U/L (ref 38–126)
Anion gap: 6 (ref 5–15)
BUN: 15 mg/dL (ref 6–20)
CHLORIDE: 106 mmol/L (ref 101–111)
CO2: 25 mmol/L (ref 22–32)
CREATININE: 0.89 mg/dL (ref 0.44–1.00)
Calcium: 8.6 mg/dL — ABNORMAL LOW (ref 8.9–10.3)
GFR calc Af Amer: >60 mL/min (ref >60)
GFR, EST NON AFRICAN AMERICAN: 58 mL/min — AB (ref >60)
GLUCOSE: 160 mg/dL — AB (ref 65–99)
POTASSIUM: 4.2 mmol/L (ref 3.5–5.1)
Sodium: 137 mmol/L (ref 135–145)
Total Bilirubin: 0.6 mg/dL (ref 0.3–1.2)
Total Protein: 6.1 g/dL — ABNORMAL LOW (ref 6.5–8.1)

## 2016-02-29 LAB — CBC
HEMATOCRIT: 33.3 % — AB (ref 36.0–46.0)
HEMOGLOBIN: 10.3 g/dL — AB (ref 12.0–15.0)
MCH: 29.3 pg (ref 26.0–34.0)
MCHC: 30.9 g/dL (ref 30.0–36.0)
MCV: 94.6 fL (ref 78.0–100.0)
Platelets: 120 10*3/uL — ABNORMAL LOW (ref 150–400)
RBC: 3.52 MIL/uL — AB (ref 3.87–5.11)
RDW: 16.9 % — ABNORMAL HIGH (ref 11.5–15.5)
WBC: 5.2 10*3/uL (ref 4.0–10.5)

## 2016-02-29 LAB — GLUCOSE, CAPILLARY: GLUCOSE-CAPILLARY: 138 mg/dL — AB (ref 65–99)

## 2016-02-29 LAB — MAGNESIUM: Magnesium: 1.8 mg/dL (ref 1.7–2.4)

## 2016-02-29 LAB — AMMONIA: AMMONIA: 122 umol/L — AB (ref 9–35)

## 2016-02-29 MED ORDER — LACTULOSE 10 GM/15ML PO SOLN
20.0000 g | Freq: Three times a day (TID) | ORAL | Status: DC
Start: 2016-02-29 — End: 2016-03-01
  Administered 2016-02-29 – 2016-03-01 (×4): 20 g via ORAL
  Filled 2016-02-29 (×4): qty 30

## 2016-02-29 MED ORDER — RIFAXIMIN 550 MG PO TABS
550.0000 mg | ORAL_TABLET | Freq: Two times a day (BID) | ORAL | Status: DC
  Administered 2016-02-29 – 2016-03-02 (×4): 550 mg via ORAL
  Filled 2016-02-29 (×4): qty 1

## 2016-02-29 MED ORDER — TRAMADOL HCL 50 MG PO TABS
50.0000 mg | ORAL_TABLET | Freq: Four times a day (QID) | ORAL | Status: DC | PRN
  Administered 2016-03-01: 50 mg via ORAL
  Filled 2016-02-29: qty 1

## 2016-02-29 MED ORDER — LORATADINE 10 MG PO TABS
10.0000 mg | ORAL_TABLET | Freq: Every day | ORAL | Status: DC
  Administered 2016-02-29 – 2016-03-02 (×3): 10 mg via ORAL
  Filled 2016-02-29 (×3): qty 1

## 2016-02-29 MED ORDER — CLONAZEPAM 1 MG PO TABS
2.0000 mg | ORAL_TABLET | Freq: Once | ORAL | Status: AC
  Administered 2016-02-29: 2 mg via ORAL
  Filled 2016-02-29: qty 2

## 2016-02-29 MED ORDER — FUROSEMIDE 10 MG/ML IJ SOLN
20.0000 mg | Freq: Two times a day (BID) | INTRAMUSCULAR | Status: DC
  Administered 2016-02-29 – 2016-03-02 (×4): 20 mg via INTRAVENOUS
  Filled 2016-02-29 (×4): qty 2

## 2016-02-29 MED ORDER — MAGNESIUM SULFATE 2 GM/50ML IV SOLN
2.0000 g | Freq: Once | INTRAVENOUS | Status: AC
  Administered 2016-02-29: 2 g via INTRAVENOUS
  Filled 2016-02-29: qty 50

## 2016-02-29 NOTE — Progress Notes (Signed)
PROGRESS NOTE    Monique Fleming  [id number] DOB: [birth year] DOA: 02/27/2016 PCP: Penni Homans, MD    Brief Narrative: Monique Fleming is a 82 y.o. female with medical history significant of diabetes mellitus type 2, HTN, COPD, anxiety/depression, aortic stenosis, and mild cognitive impairment; who presents after being found acutely altered.    Assessment & Plan:   Principal Problem:   Encephalopathy, hepatic (HCC) Active Problems:   Mild cognitive disorder   Diabetes mellitus type 2, uncomplicated (HCC)   Depression with anxiety   Lactic acidosis   Acute CHF (congestive heart failure) (HCC)   Acute encephalopathy: Probably sec to hepatic encephalopathy.  On lactulose.  Repeat ammonia levels in am show worsening of the level. Continue with lactulose and add rifaximin.    MRI brain and EEG done, negative for stroke and seizure activity.    Diabetes Mellitus: CBG (last 3)   Recent Labs  02/27/16 1521 02/29/16 1651  GLUCAP 108* 138*    Resume SSI.   Lactic acidosis:   Acute Diastolic [id number] her on low dose lasix and monitor renal function on lasix.    Prolonged QT Interval: - repeat EKG, repeat mg in am.      DVT prophylaxis: lovenox.  Code Status: DNR Family Communication: daughter at bedside.  Disposition Plan: pending PT eval.   Consultants:   none   Procedures: (none   Antimicrobials: none   Subjective: No new complaints.   Objective: Filed Vitals:   02/29/16 0611 02/29/16 0740 02/29/16 1001 02/29/16 1512  BP:   124/44 138/50  Pulse:   73 76  Temp:   98 F (36.7 C) 97.9 F (36.6 C)  TempSrc:      Resp:   14 14  Height:      Weight: 50.2 kg (110 lb 10.7 oz)     SpO2:  98% 96% 98%    Intake/Output Summary (Last 24 hours) at 02/29/16 1702 Last data filed at 02/29/16 0853  Gross per 24 hour  Intake    120 ml  Output      0 ml  Net    120 ml   Filed Weights   02/28/16 0505 02/29/16 0536 02/29/16 0611  Weight: 46 kg  (101 lb 6.6 oz) 47.9 kg (105 lb 9.6 oz) 50.2 kg (110 lb 10.7 oz)    Examination:  General exam: Appears calm and comfortable  Respiratory system: Clear to auscultation. Respiratory effort normal. Cardiovascular system: S1 & S2 heard, RRR. No JVD, murmurs, rubs, gallops or clicks. No pedal edema. Gastrointestinal system: Abdomen is nondistended, soft and nontender. No organomegaly or masses felt. Normal bowel sounds heard. Central nervous system: Alert and  Confused. . No focal neurological deficits. Extremities: Symmetric 5 x 5 power. Skin: No rashes, lesions or ulcers Psychiatry: Judgement and insight appear normal. Mood & affect appropriate.     Data Reviewed: I have personally reviewed following labs and imaging studies  CBC:  Recent Labs Lab 02/27/16 1520 02/28/16 0205 02/29/16 0950  WBC 4.4 6.0 5.2  NEUTROABS 2.9  --   --   HGB 11.1* 10.3* 10.3*  HCT 34.4* 33.5* 33.3*  MCV 96.4 93.1 94.6  PLT 112* 133* [id number]*   Basic Metabolic Panel:  Recent Labs Lab 02/27/16 1520 02/28/16 0205 02/29/16 0950  NA 140 141 137  K 3.9 3.7 4.2  CL 107 108 106  CO2 25 27 25   GLUCOSE 114* 133* 160*  BUN 22* 13 15  CREATININE 0.85 0.79 0.89  CALCIUM 8.9 9.1 8.6*  MG 1.4* 1.5* 1.8   GFR: Estimated Creatinine Clearance: 37.3 mL/min (by C-G formula based on Cr of 0.89). Liver Function Tests:  Recent Labs Lab 02/27/16 1520 02/29/16 0950  AST 59* 45*  ALT 27 23  ALKPHOS 107 102  BILITOT 0.6 0.6  PROT 6.4* 6.1*  ALBUMIN 3.0* 2.8*   No results for input(s): LIPASE, AMYLASE in the last 168 hours.  Recent Labs Lab 02/27/16 1520 02/28/16 0205 02/29/16 0950  AMMONIA 99* 90* 122*   Coagulation Profile: No results for input(s): INR, PROTIME in the last 168 hours. Cardiac Enzymes:  Recent Labs Lab 02/27/16 1520 02/27/16 2107 02/27/16 2314 02/28/16 0205  TROPONINI 0.06* 0.06* 0.06* 0.06*   BNP (last 3 results) No results for input(s): PROBNP in the last 8760  hours. HbA1C: No results for input(s): HGBA1C in the last 72 hours. CBG:  Recent Labs Lab 02/27/16 1521 02/29/16 1651  GLUCAP 108* 138*   Lipid Profile: No results for input(s): CHOL, HDL, LDLCALC, TRIG, CHOLHDL, LDLDIRECT in the last 72 hours. Thyroid Function Tests: No results for input(s): TSH, T4TOTAL, FREET4, T3FREE, THYROIDAB in the last 72 hours. Anemia Panel: No results for input(s): VITAMINB12, FOLATE, FERRITIN, TIBC, IRON, RETICCTPCT in the last 72 hours. Sepsis Labs:  Recent Labs Lab 02/27/16 1527 02/27/16 2107 02/27/16 2314  LATICACIDVEN 2.42* 1.4 1.7    Recent Results (from the past 240 hour(s))  Culture, blood (routine x 2)     Status: None (Preliminary result)   Collection Time: 02/27/16  3:49 PM  Result Value Ref Range Status   Specimen Description BLOOD RT HAND  Final   Special Requests BOTTLES DRAWN AEROBIC AND ANAEROBIC 5CC  Final   Culture   Final    NO GROWTH 2 DAYS Performed at [facility]    Report Status PENDING  Incomplete  Culture, blood (routine x 2)     Status: None (Preliminary result)   Collection Time: 02/27/16  3:50 PM  Result Value Ref Range Status   Specimen Description BLOOD LEFT AC  Final   Special Requests BOTTLES DRAWN AEROBIC AND ANAEROBIC 5CC  Final   Culture   Final    NO GROWTH 2 DAYS Performed at [facility]    Report Status PENDING  Incomplete         Radiology Studies: Mr Brain Wo Contrast  02/28/2016  CLINICAL DATA:  82 year old diabetic female with altered mental status. Subsequent encounter. EXAM: MRI HEAD WITHOUT CONTRAST TECHNIQUE: Multiplanar, multiecho pulse sequences of the brain and surrounding structures were obtained without intravenous contrast. COMPARISON:  02/27/2016 CT.  01/19/2016 MR. FINDINGS: No acute infarct. Multiple scattered blood breakdown products probably related to result of remote hemorrhagic ischemia/ chronic microvascular changes. Result of cavernomas or prior trauma felt  to be less likely considerations. Appearance unchanged. Moderate chronic microvascular white matter changes. Global atrophy without hydrocephalus. No intracranial mass lesion noted on this unenhanced exam. Major intracranial vascular structures are patent. Decreased signal intensity of bone marrow may be related to patient's habitus. Correlation with CBC to exclude anemia contributing to this appearance may be considered Cervical medullary junction unremarkable. IMPRESSION: No acute infarct. Otherwise, findings similar to the recent brain MR as detailed above. Electronically Signed   By: Genia Del M.D.   On: 02/28/2016 20:46   US Abdomen Complete  02/28/2016  CLINICAL DATA:  Hepatic encephalopathy. Cirrhosis. History of diabetes. EXAM: ABDOMEN ULTRASOUND COMPLETE COMPARISON:  08/21/2014 FINDINGS: Gallbladder: Status post cholecystectomy. Common bile  duct: Diameter: 4.7 mm Liver: Heterogeneous echotexture. Nodular surface. Numerous hyperechoic foci throughout the liver parenchyma. No discrete solid or cystic lesions. IVC: No abnormality visualized. Pancreas: Visualized portion unremarkable. Spleen: 13.2 cm.  Volume is 570.6 cubic cm. Right Kidney: Length: 10.3 cm. Echogenicity within normal limits. Midpole cyst is 3.0 x 3.1 x 3.5 cm. Left Kidney: Length: 12.2 cm. Two adjacent cysts are identified. Each contains a single internal septation. These Measure 6.6 x 6.6 x 6.3 cm and 5.0 x 4.9 x 5.4 cm. Abdominal aorta: 3.1 cm distal aspect of the aorta is difficult to evaluate because of patient tolerance. Atherosclerotic calcifications noted. Other findings: Small amount of ascites. IMPRESSION: 1. Cirrhotic changes in the liver. 2. Numerous echogenic foci throughout the liver, consistent with granulomata or fibrosis. 3. Splenomegaly consistent with portal venous hypertension. 4. Status post cholecystectomy. 5. Bilateral renal cysts with benign features. 6. Small amount of ascites. Electronically Signed   By:  Nolon Nations M.D.   On: 02/28/2016 10:32        Scheduled Meds: . arformoterol  15 mcg Nebulization Q12H  . clopidogrel  75 mg Oral Daily  . enoxaparin (LOVENOX) injection  40 mg Subcutaneous Q24H  . lactulose  20 g Oral TID  . magnesium oxide  400 mg Oral BID  . montelukast  10 mg Oral QHS  . multivitamin  1 tablet Oral BID  . pantoprazole  40 mg Oral q morning - 10a  . phenytoin  200 mg Oral QHS  . potassium chloride  10 mEq Oral BID   Continuous Infusions:    LOS: 2 days    Time spent: 25 minutes.     Hosie Poisson, MD [facility] Pager [phone]  If 7PM-7AM, please contact night-coverage [email or web address] Password [email or web address] 02/29/2016, 5:02 PM

## 2016-02-29 NOTE — Progress Notes (Signed)
Occupational Therapy Evaluation/Discharge Patient Details Name: Monique Fleming MRN: [id number] DOB: 1928-10-08 Today's Date: 02/29/2016    History of Present Illness Monique Fleming is an 82 y.o. female admitted with AMS, dx of hepatic encephalopathy; PMH of seizure disorder, anxiety, type 2 diabetes mellitus, and COPD.    Clinical Impression   PTA, pt was independent with ADLs except for requiring assistance to get in/out of shower and only used rollator for community mobility. Pt currently requires supervision for transfers and ambulation due to some balance deficits and decreased safety awareness. Pt completed all ADLs at mod I level and has no further acute OT needs. Recommend HHOT upon discharge. OT signing off. Thank you for this referral.    Follow Up Recommendations  Home health OT;Supervision/Assistance - 24 hour    Equipment Recommendations  None recommended by OT    Recommendations for Other Services       Precautions / Restrictions Precautions Precautions: Fall Restrictions Weight Bearing Restrictions: No      Mobility Bed Mobility Overal bed mobility: Modified Independent                Transfers Overall transfer level: Needs assistance Equipment used: None Transfers: Sit to/from Stand Sit to Stand: Supervision         General transfer comment: Supervision for safety. When conversing while ambulating in hallway pt had multiple minor LOB but was able to regain balance without assistance. Pt lost balance to L side consistenly.    Balance Overall balance assessment: Needs assistance Sitting-balance support: No upper extremity supported;Feet supported Sitting balance-Leahy Scale: Normal     Standing balance support: No upper extremity supported;During functional activity Standing balance-Leahy Scale: Good Standing balance comment: some minor LOB during dynamic balance tasks                            ADL Overall ADL's : Modified  independent                                       General ADL Comments: Able to complete all ADLs and transfers at mod I level     Vision Vision Assessment?: No apparent visual deficits   Perception     Praxis      Pertinent Vitals/Pain Pain Assessment: No/denies pain     Hand Dominance Right   Extremity/Trunk Assessment Upper Extremity Assessment Upper Extremity Assessment: Overall WFL for tasks assessed   Lower Extremity Assessment Lower Extremity Assessment: Overall WFL for tasks assessed   Cervical / Trunk Assessment Cervical / Trunk Assessment: Kyphotic   Communication Communication Communication: No difficulties   Cognition Arousal/Alertness: Awake/alert Behavior During Therapy: WFL for tasks assessed/performed Overall Cognitive Status: Impaired/Different from baseline Area of Impairment: Memory;Safety/judgement;Awareness     Memory: Decreased short-term memory   Safety/Judgement: Decreased awareness of safety;Decreased awareness of deficits     General Comments: Pt's daughters report that her cognition and confusion have improved since yesterday, but she is still not at her baseline.   General Comments       Exercises       Shoulder Instructions      Home Living Family/patient expects to be discharged to:: Private residence Living Arrangements: Children;Other (Comment) (sitter during the day, daughters take turns after work) Available Help at Discharge: Family;Available 24 hours/day Type of Home: Other(Comment) (townhouse) Home Access: Level entry  Home Layout: Two level;Able to live on main level with bedroom/bathroom     Bathroom Shower/Tub: Occupational psychologist: Standard     Home Equipment: Environmental consultant - 4 wheels;Bedside commode;Shower seat;Grab bars - tub/shower   Additional Comments: Does not BSC and only uses rollator for community mobility      Prior Functioning/Environment Level of Independence:  Independent        Comments: No AD when ambulating in the house, only use rollator for community mobility. Sitter provides supervision while daughters are at work and daughters take turns after work.    OT Diagnosis: Cognitive deficits   OT Problem List: Impaired balance (sitting and/or standing);Decreased activity tolerance;Decreased safety awareness;Decreased cognition   OT Treatment/Interventions:      OT Goals(Current goals can be found in the care plan section) Acute Rehab OT Goals Patient Stated Goal: to go home and be independent OT Goal Formulation: With patient Time For Goal Achievement: 03/14/16 Potential to Achieve Goals: Good  OT Frequency:     Barriers to D/C:            Co-evaluation              End of Session Equipment Utilized During Treatment: Gait belt Nurse Communication: Mobility status  Activity Tolerance: Patient tolerated treatment well Patient left: in bed;with call bell/phone within reach;with family/visitor present   Time: [id number] OT Time Calculation (min): 20 min Charges:  OT General Charges $OT Visit: 1 Procedure OT Evaluation $OT Eval Moderate Complexity: 1 Procedure G-Codes:    Redmond Baseman, OTR/L [phone] 02/29/2016, 5:09 PM

## 2016-03-01 ENCOUNTER — Observation Stay (HOSPITAL_COMMUNITY): Payer: Medicare Other

## 2016-03-01 DIAGNOSIS — E872 Acidosis: Secondary | ICD-10-CM | POA: Diagnosis present

## 2016-03-01 DIAGNOSIS — K7290 Hepatic failure, unspecified without coma: Secondary | ICD-10-CM | POA: Diagnosis present

## 2016-03-01 DIAGNOSIS — Z7902 Long term (current) use of antithrombotics/antiplatelets: Secondary | ICD-10-CM | POA: Diagnosis not present

## 2016-03-01 DIAGNOSIS — F419 Anxiety disorder, unspecified: Secondary | ICD-10-CM | POA: Diagnosis present

## 2016-03-01 DIAGNOSIS — F329 Major depressive disorder, single episode, unspecified: Secondary | ICD-10-CM | POA: Diagnosis present

## 2016-03-01 DIAGNOSIS — I110 Hypertensive heart disease with heart failure: Secondary | ICD-10-CM | POA: Diagnosis present

## 2016-03-01 DIAGNOSIS — I5031 Acute diastolic (congestive) heart failure: Secondary | ICD-10-CM | POA: Diagnosis present

## 2016-03-01 DIAGNOSIS — Z7951 Long term (current) use of inhaled steroids: Secondary | ICD-10-CM | POA: Diagnosis not present

## 2016-03-01 DIAGNOSIS — Z85038 Personal history of other malignant neoplasm of large intestine: Secondary | ICD-10-CM | POA: Diagnosis not present

## 2016-03-01 DIAGNOSIS — G40909 Epilepsy, unspecified, not intractable, without status epilepticus: Secondary | ICD-10-CM | POA: Diagnosis present

## 2016-03-01 DIAGNOSIS — E1151 Type 2 diabetes mellitus with diabetic peripheral angiopathy without gangrene: Secondary | ICD-10-CM | POA: Diagnosis present

## 2016-03-01 DIAGNOSIS — Z87891 Personal history of nicotine dependence: Secondary | ICD-10-CM | POA: Diagnosis not present

## 2016-03-01 DIAGNOSIS — R4182 Altered mental status, unspecified: Secondary | ICD-10-CM | POA: Diagnosis present

## 2016-03-01 DIAGNOSIS — G3184 Mild cognitive impairment, so stated: Secondary | ICD-10-CM | POA: Diagnosis present

## 2016-03-01 DIAGNOSIS — J449 Chronic obstructive pulmonary disease, unspecified: Secondary | ICD-10-CM | POA: Diagnosis present

## 2016-03-01 DIAGNOSIS — K589 Irritable bowel syndrome without diarrhea: Secondary | ICD-10-CM | POA: Diagnosis present

## 2016-03-01 DIAGNOSIS — Z66 Do not resuscitate: Secondary | ICD-10-CM | POA: Diagnosis present

## 2016-03-01 DIAGNOSIS — I350 Nonrheumatic aortic (valve) stenosis: Secondary | ICD-10-CM | POA: Diagnosis present

## 2016-03-01 DIAGNOSIS — Z8673 Personal history of transient ischemic attack (TIA), and cerebral infarction without residual deficits: Secondary | ICD-10-CM | POA: Diagnosis not present

## 2016-03-01 LAB — AMMONIA: Ammonia: 144 umol/L — ABNORMAL HIGH (ref 9–35)

## 2016-03-01 LAB — MAGNESIUM: Magnesium: 2 mg/dL (ref 1.7–2.4)

## 2016-03-01 MED ORDER — LACTULOSE 10 GM/15ML PO SOLN
30.0000 g | Freq: Three times a day (TID) | ORAL | Status: DC
  Administered 2016-03-01 – 2016-03-02 (×2): 30 g via ORAL
  Filled 2016-03-01 (×2): qty 45

## 2016-03-01 NOTE — Progress Notes (Addendum)
PROGRESS NOTE    Monique Fleming  [id number] DOB: 1929/07/01 DOA: 02/27/2016 PCP: Penni Homans, MD    Brief Narrative: Monique Fleming is a 82 y.o. female with medical history significant of diabetes mellitus type 2, HTN, COPD, anxiety/depression, aortic stenosis, and mild cognitive impairment; who presents after being found acutely altered.    Assessment & Plan:   Principal Problem:   Encephalopathy, hepatic (HCC) Active Problems:   Mild cognitive disorder   Diabetes mellitus type 2, uncomplicated (HCC)   Depression with anxiety   Lactic acidosis   Acute CHF (congestive heart failure) (HCC)   Acute encephalopathy: Probably sec to hepatic encephalopathy, she is clearing up. She is more awake but she remains confused on further questioning her.  On lactulose.  Repeat ammonia levels in am show worsening of the level. Increase the lactulose and added rifaximin.    MRI brain and EEG done, negative for stroke and seizure activity.    Diabetes Mellitus: CBG (last 3)   Recent Labs  02/29/16 1651  GLUCAP 138*    Resume SSI.   Lactic acidosis: normalized .   Acute Diastolic [id number] her on low dose lasix and monitor renal function on lasix.  Repeat CXR inam.    Prolonged QT Interval: - repeat EKG in am.   Sinus pain and pressure: X rays do not show any opacification.      DVT prophylaxis: lovenox.  Code Status: DNR Family Communication: daughter at bedside.  Disposition Plan: pending PT eval.   Consultants:   none   Procedures: (none   Antimicrobials: none   Subjective: Saw her on   Objective: Filed Vitals:   03/01/16 0443 03/01/16 0641 03/01/16 0918 03/01/16 1334  BP:    114/39  Pulse:    81  Temp:    98 F (36.7 C)  TempSrc:      Resp:    19  Height:      Weight: 52.436 kg (115 lb 9.6 oz) 49.6 kg (109 lb 5.6 oz)    SpO2:   92% 100%    Intake/Output Summary (Last 24 hours) at 03/01/16 1755 Last data filed at 03/01/16 0931  Gross  per 24 hour  Intake    370 ml  Output      0 ml  Net    370 ml   Filed Weights   02/29/16 0611 03/01/16 0443 03/01/16 0641  Weight: 50.2 kg (110 lb 10.7 oz) 52.436 kg (115 lb 9.6 oz) 49.6 kg (109 lb 5.6 oz)    Examination:  General exam: Appears calm and comfortable  Respiratory system: Clear to auscultation. Respiratory effort normal. Cardiovascular system: S1 & S2 heard, RRR. No JVD, murmurs, rubs, gallops or clicks. No pedal edema. Gastrointestinal system: Abdomen is nondistended, soft and nontender. No organomegaly or masses felt. Normal bowel sounds heard. Central nervous system: Alert and  Still Confused. . No focal neurological deficits. Extremities: Symmetric 5 x 5 power. Skin: No rashes, lesions or ulcers     Data Reviewed: I have personally reviewed following labs and imaging studies  CBC:  Recent Labs Lab 02/27/16 1520 02/28/16 0205 02/29/16 0950  WBC 4.4 6.0 5.2  NEUTROABS 2.9  --   --   HGB 11.1* 10.3* 10.3*  HCT 34.4* 33.5* 33.3*  MCV 96.4 93.1 94.6  PLT 112* 133* 123456*   Basic Metabolic Panel:  Recent Labs Lab 02/27/16 1520 02/28/16 0205 02/29/16 0950 03/01/16 0427  NA 140 141 137  --   K 3.9  3.7 4.2  --   CL 107 108 106  --   CO2 25 27 25   --   GLUCOSE 114* 133* 160*  --   BUN 22* 13 15  --   CREATININE 0.85 0.79 0.89  --   CALCIUM 8.9 9.1 8.6*  --   MG 1.4* 1.5* 1.8 2.0   GFR: Estimated Creatinine Clearance: 36.8 mL/min (by C-G formula based on Cr of 0.89). Liver Function Tests:  Recent Labs Lab 02/27/16 1520 02/29/16 0950  AST 59* 45*  ALT 27 23  ALKPHOS 107 102  BILITOT 0.6 0.6  PROT 6.4* 6.1*  ALBUMIN 3.0* 2.8*   No results for input(s): LIPASE, AMYLASE in the last 168 hours.  Recent Labs Lab 02/27/16 1520 02/28/16 0205 02/29/16 0950 03/01/16 0427  AMMONIA 99* 90* 122* 144*   Coagulation Profile: No results for input(s): INR, PROTIME in the last 168 hours. Cardiac Enzymes:  Recent Labs Lab 02/27/16 1520  02/27/16 2107 02/27/16 2314 02/28/16 0205  TROPONINI 0.06* 0.06* 0.06* 0.06*   BNP (last 3 results) No results for input(s): PROBNP in the last 8760 hours. HbA1C: No results for input(s): HGBA1C in the last 72 hours. CBG:  Recent Labs Lab 02/27/16 1521 02/29/16 1651  GLUCAP 108* 138*   Lipid Profile: No results for input(s): CHOL, HDL, LDLCALC, TRIG, CHOLHDL, LDLDIRECT in the last 72 hours. Thyroid Function Tests: No results for input(s): TSH, T4TOTAL, FREET4, T3FREE, THYROIDAB in the last 72 hours. Anemia Panel: No results for input(s): VITAMINB12, FOLATE, FERRITIN, TIBC, IRON, RETICCTPCT in the last 72 hours. Sepsis Labs:  Recent Labs Lab 02/27/16 1527 02/27/16 2107 02/27/16 2314  LATICACIDVEN 2.42* 1.4 1.7    Recent Results (from the past 240 hour(s))  Culture, blood (routine x 2)     Status: None (Preliminary result)   Collection Time: 02/27/16  3:49 PM  Result Value Ref Range Status   Specimen Description BLOOD RT HAND  Final   Special Requests BOTTLES DRAWN AEROBIC AND ANAEROBIC 5CC  Final   Culture   Final    NO GROWTH 3 DAYS Performed at [facility]    Report Status PENDING  Incomplete  Culture, blood (routine x 2)     Status: None (Preliminary result)   Collection Time: 02/27/16  3:50 PM  Result Value Ref Range Status   Specimen Description BLOOD LEFT AC  Final   Special Requests BOTTLES DRAWN AEROBIC AND ANAEROBIC 5CC  Final   Culture   Final    NO GROWTH 3 DAYS Performed at [facility]    Report Status PENDING  Incomplete         Radiology Studies: Dg Sinuses Complete  03/01/2016  CLINICAL DATA:  Frontal sinus pain and left nasal congestion EXAM: PARANASAL SINUSES - COMPLETE 3 + VIEW COMPARISON:  None. FINDINGS: Normally pneumatized paranasal sinuses with no mucosal thickening or air-fluid levels seen. The patient is edentulous. IMPRESSION: No evidence of sinusitis. Electronically Signed   By: Claudie Revering M.D.   On: 03/01/2016  12:31   Mr Brain Wo Contrast  02/28/2016  CLINICAL DATA:  82 year old diabetic female with altered mental status. Subsequent encounter. EXAM: MRI HEAD WITHOUT CONTRAST TECHNIQUE: Multiplanar, multiecho pulse sequences of the brain and surrounding structures were obtained without intravenous contrast. COMPARISON:  02/27/2016 CT.  01/19/2016 MR. FINDINGS: No acute infarct. Multiple scattered blood breakdown products probably related to result of remote hemorrhagic ischemia/ chronic microvascular changes. Result of cavernomas or prior trauma felt to be less  likely considerations. Appearance unchanged. Moderate chronic microvascular white matter changes. Global atrophy without hydrocephalus. No intracranial mass lesion noted on this unenhanced exam. Major intracranial vascular structures are patent. Decreased signal intensity of bone marrow may be related to patient's habitus. Correlation with CBC to exclude anemia contributing to this appearance may be considered Cervical medullary junction unremarkable. IMPRESSION: No acute infarct. Otherwise, findings similar to the recent brain MR as detailed above. Electronically Signed   By: Genia Del M.D.   On: 02/28/2016 20:46        Scheduled Meds: . arformoterol  15 mcg Nebulization Q12H  . clopidogrel  75 mg Oral Daily  . enoxaparin (LOVENOX) injection  40 mg Subcutaneous Q24H  . furosemide  20 mg Intravenous Q12H  . lactulose  30 g Oral TID  . loratadine  10 mg Oral Daily  . magnesium oxide  400 mg Oral BID  . montelukast  10 mg Oral QHS  . multivitamin  1 tablet Oral BID  . pantoprazole  40 mg Oral q morning - 10a  . phenytoin  200 mg Oral QHS  . potassium chloride  10 mEq Oral BID  . rifaximin  550 mg Oral BID   Continuous Infusions:    LOS: 3 days    Time spent: 25 minutes.     Hosie Poisson, MD [facility] Pager [phone]  If 7PM-7AM, please contact night-coverage [email or web address] Password [email or web address] 03/01/2016, 5:55 PM

## 2016-03-02 ENCOUNTER — Inpatient Hospital Stay (HOSPITAL_COMMUNITY): Payer: Medicare Other

## 2016-03-02 LAB — MAGNESIUM: Magnesium: 2 mg/dL (ref 1.7–2.4)

## 2016-03-02 LAB — AMMONIA: Ammonia: 111 umol/L — ABNORMAL HIGH (ref 9–35)

## 2016-03-02 MED ORDER — MAGNESIUM 400 MG PO TABS: 400.0000 mg | ORAL_TABLET | Freq: Two times a day (BID) | ORAL | Status: AC

## 2016-03-02 MED ORDER — LACTULOSE 10 GM/15ML PO SOLN: 20.0000 g | Freq: Two times a day (BID) | ORAL | Status: DC

## 2016-03-02 MED ORDER — RIFAXIMIN 550 MG PO TABS: 550.0000 mg | ORAL_TABLET | Freq: Two times a day (BID) | ORAL | Status: DC

## 2016-03-02 NOTE — Progress Notes (Signed)
Monique Fleming to be D/C'd Home per MD order.  Discussed with the patient and all questions fully answered.    Medication List    STOP taking these medications        hydrocortisone 25 MG suppository  Commonly known as:  ANUSOL-HC     magnesium oxide 400 MG tablet  Commonly known as:  MAG-OX     memantine tablet pack  Commonly known as:  NAMENDA TITRATION PAK     PROCEL Powd     UNABLE TO FIND      TAKE these medications        albuterol 108 (90 Base) MCG/ACT inhaler  Commonly known as:  PROAIR HFA  Inhale 2 puffs into the lungs every 4 (four) hours as needed for wheezing or shortness of breath.     CARNATION INSTANT BREAKFAST PO  Take 1 each by mouth 3 (three) times daily.     cholestyramine 4 g packet  Commonly known as:  QUESTRAN  MIX 1 PACKET (4 GRAM TOTAL) IN LIQUID AND DRINK DAILY AS NEEDED FOR DIARRHEA     clonazePAM 1 MG tablet  Commonly known as:  KLONOPIN  Take 1 tablet (1 mg total) by mouth 2 (two) times daily as needed for anxiety.     clopidogrel 75 MG tablet  Commonly known as:  PLAVIX  TAKE 1 TABLET DAILY     desvenlafaxine 100 MG 24 hr tablet  Commonly known as:  PRISTIQ  Take 100 mg by mouth daily.     fluticasone 50 MCG/ACT nasal spray  Commonly known as:  FLONASE  USE 2 SPRAYS IN EACH NOSTRIL DAILY AS NEEDED FOR ALLERGIES     formoterol 20 MCG/2ML nebulizer solution  Commonly known as:  PERFOROMIST  Take 2 mLs (20 mcg total) by nebulization 2 (two) times daily.     glucose blood test strip  Commonly known as:  BAYER CONTOUR NEXT TEST  Use as directed once daily to check blood sugar.  DX E11.9     lactulose 10 GM/15ML solution  Commonly known as:  CHRONULAC  Take 30 mLs (20 g total) by mouth 2 (two) times daily.     Magnesium 400 MG Tabs  Take 400 mg by mouth 2 (two) times daily.     montelukast 10 MG tablet  Commonly known as:  SINGULAIR  Take 10 mg by mouth at bedtime.     OCUVITE PRESERVISION Tabs  Take by mouth 2 (two) times  daily.     ondansetron 4 MG tablet  Commonly known as:  ZOFRAN  Take 1 tablet (4 mg total) by mouth every 6 (six) hours as needed for nausea or vomiting.     pantoprazole 40 MG tablet  Commonly known as:  PROTONIX  TAKE 1 TABLET EVERY MORNING     phenytoin 100 MG ER capsule  Commonly known as:  DILANTIN  Take 2 capsules (200 mg total) by mouth at bedtime. Reported on 02/13/2016     potassium chloride 10 MEQ tablet  Commonly known as:  K-DUR  Take 10 mEq by mouth 2 (two) times daily.     rifaximin 550 MG Tabs tablet  Commonly known as:  XIFAXAN  Take 1 tablet (550 mg total) by mouth 2 (two) times daily.     saccharomyces boulardii 250 MG capsule  Commonly known as:  FLORASTOR  Take 250 mg by mouth daily.     sodium chloride 0.65 % Soln nasal spray  Commonly known as:  OCEAN  Place 2 sprays into both nostrils daily as needed for congestion.     SYSTANE 0.4-0.3 % Soln  Generic drug:  Polyethyl Glycol-Propyl Glycol  Place 1 drop into both eyes 2 (two) times daily as needed (dry eyes).     traMADol 50 MG tablet  Commonly known as:  ULTRAM  Take 1 tablet (50 mg total) by mouth every 12 (twelve) hours as needed for severe pain.        VVS, Skin clean, dry and intact without evidence of skin break down, no evidence of skin tears noted. IV catheter discontinued intact. Site without signs and symptoms of complications. Dressing and pressure applied.  An After Visit Summary was printed and given to the patient. Patient escorted via [place], and D/C home via private auto.  Elias Else D 03/02/2016 12:17 PM

## 2016-03-02 NOTE — Care Management Important Message (Signed)
Important Message  Patient Details  Name: Monique Fleming MRN: [id number] Date of Birth: 15-Nov-1928   Medicare Important Message Given:  Yes    Monique Fleming 03/02/2016, 10:01 AM

## 2016-03-02 NOTE — Evaluation (Signed)
Speech Language Pathology Evaluation Patient Details Name: Monique Fleming MRN: [id number] DOB: 09-04-1929 Today's Date: 03/02/2016 Time:  -     Problem List:  Patient Active Problem List   Diagnosis Date Noted  . Encephalopathy, hepatic ([place]) 02/28/2016  . Lactic acidosis 02/28/2016  . Acute CHF (congestive heart failure) ([place]) 02/28/2016  . Altered mental status 02/27/2016  . Dementia 02/13/2016  . Gait instability   . Depression with anxiety   . Seizures ([place])   . Abnormal EEG   . Normocytic anemia   . Acute encephalopathy 01/18/2016  . Speech disturbance 01/18/2016  . Prolonged Q-T interval on ECG 01/18/2016  . Right shoulder pain 11/07/2015  . Elevated BP 07/09/2015  . Cold sore 04/25/2015  . Chronic respiratory failure with hypoxia ([place]) 04/25/2015  . Rib pain on left side 02/21/2015  . Popliteal pain 02/07/2015  . Pedal edema 02/07/2015  . Pain in joint, ankle and foot 02/07/2015  . Wheezing 12/21/2014  . Diabetes mellitus type 2, uncomplicated ([place]) [id number]  . Diabetes mellitus, type 2 ([place]) 12/21/2014  . Chest pain 08/24/2014  . Chest pain, mid sternal   . General weakness 08/22/2014  . Dehydration 08/22/2014  . Acute diarrhea 08/22/2014  . Hemorrhoids 08/22/2014  . Rectal bleeding 07/04/2014  . Nonspecific (abnormal) findings on radiological and other examination of gastrointestinal tract 07/04/2014  . Iron deficiency anemia  06/27/2014  . Mild cognitive disorder 04/03/2014  . COPD (chronic obstructive pulmonary disease) with chronic bronchitis Gold B 03/14/2014  . Depression, major, recurrent ([place]) 03/06/2014  . Macular degeneration 02/18/2014  . Anxiety state 01/08/2014  . Carotid disease, bilateral ([place]) 01/02/2014  . Burning with urination 12/17/2013  . Aortic stenosis 12/17/2013  . Wounds, multiple 06/28/2013  . Thrombocytopenia ([place]) 04/01/2013  . Cerebral artery occlusion ([place]) 03/11/2013  . Cardiac disease 03/11/2013  . Nasal septal  perforation 07/20/2012  . Hyperlipidemia 10/20/2011  . Reaction to Pneumovax immunization 03/07/2011  . ACTINIC KERATOSIS 08/22/2010  . NEOPLASM OF UNCERTAIN BEHAVIOR OF SKIN 05/28/2010  . ANGIODYSPLASIA-INTESTINE 02/25/2010  . ANEMIA-B12 DEFICIENCY 12/30/2009  . Megaloblastic anemia due to B12 deficiency 12/30/2009  . Iron deficiency anemia 12/23/2009  . BREAST PAIN 10/09/2009  . CIRRHOSIS 03/08/2009  . DIVERTICULOSIS-COLON 02/28/2009  . History of malignant neoplasm of large intestine 02/28/2009  . PERSONAL HX COLONIC POLYPS 02/28/2009  . UNSPECIFIED BREAST DISORDER 02/13/2009  . SEIZURE DISORDER 12/25/2008  . GRAND MAL SEIZURE 10/08/2008  . NECK PAIN 09/10/2008  . COPD with chronic bronchitis ([place]) 05/29/2008  . Malaise and fatigue 03/26/2008  . GERD 03/07/2008  . Irritable bowel syndrome 12/07/2007  . TOBACCO ABUSE, Hx of 10/05/2007  . INSOMNIA, CHRONIC 10/05/2007  . ATHEROSCLEROSIS 10/05/2007  . SLEEP RELATED MOVEMENT DISORDER UNSPECIFIED 10/05/2007  . Type 2 diabetes mellitus with vascular disease ([place]) 03/02/2007  . ANXIETY 03/02/2007  . PERIPHERAL VASCULAR DISEASE 03/02/2007  . RENAL CYST, LEFT 03/02/2007  . TRANSIENT ISCHEMIC ATTACK, HX OF 03/02/2007   Past Medical History:  Past Medical History  Diagnosis Date  . Aortic stenosis     moderate by echo 6/11 gradient 20/65mmHg for mean and peak  . TIA (transient ischemic attack)     Dr. [name]  . Peripheral vascular disease ([place])   . IBS (irritable bowel syndrome)   . Diabetes mellitus     type II  . Chronic bronchitis   . COPD (chronic obstructive pulmonary disease) ([place])   . Atherosclerosis   . Depression   . Anxiety  Dr Casimiro Needle  . Renal cyst   . Anemia     iron deficiency  . Cellulitis   . Diverticular disease   . Infectious diarrhea(009.2)   . Sinusitis   . Thrush   . Abnormal liver function test   . GERD (gastroesophageal reflux disease)   . Insomnia     chronic  . Adenocarcinoma ([place]) 2008     colon,s/p right hemicolectomy  . Angina   . Seizures (HCC)     hx of one seizure "  . Cirrhosis of liver (HCC)     hx of  . Clostridium difficile diarrhea   . Thrombocytopenia ([place]) 04/01/2013  . Bursitis     spine per pt  . Macular degeneration 02/18/2014    [facility]  . Internal hemorrhoids   . Asthma   . Elevated BP 07/09/2015  . Type 2 diabetes mellitus with vascular disease ([place]) 03/02/2007    Chronic    . Encephalopathy, metabolic   . Protein calorie malnutrition ([place])    Past Surgical History:  Past Surgical History  Procedure Laterality Date  . Carotid endarterectomy    . Colectomy      right hemi  . Cholecystectomy  11/09  . Upper gastrointestinal endoscopy    . Tonsillectomy    . Appendectomy     HPI:  Monique Fleming is an 82 y.o. female admitted with AMS, dx of hepatic encephalopathy; PMH of seizure disorder, anxiety, type 2 diabetes mellitus, and COPD.    Assessment / Plan / Recommendation Clinical Impression  Pt demosntrates persistent cognitive deficits in areas of alternating attention, memory storage and word finding. On the [place] Cognitive Assessment (MoCA) Blind pt scored 14/22 (18 normal) indicating persistent cognitive impairment. Staff also reports decreased safety awareness. Recommend pt f/u with [facility] SLP after d/c to address functional cognition and language for increased safety and aprticipation in ADLs.     SLP Assessment  Patient needs continued Speech Lanaguage Pathology Services    Follow Up Recommendations  Home health SLP    Frequency and Duration min 1 x/week  2 weeks      SLP Evaluation Prior Functioning  Cognitive/Linguistic Baseline: Baseline deficits Baseline deficit details: cognitive deficits, getting HH SLP pta  Lives With: Daughter Available Help at Discharge: Family;Available 24 hours/day   Cognition  Overall Cognitive Status: History of cognitive impairments - at baseline Arousal/Alertness:  Awake/alert Orientation Level: Oriented X4 Attention: Focused;Sustained;Selective Focused Attention: Appears intact Sustained Attention: Appears intact Selective Attention: Impaired Selective Attention Impairment: Verbal complex Memory: Impaired Memory Impairment: Storage deficit Awareness: Appears intact Problem Solving: Impaired Problem Solving Impairment: Verbal basic;Functional basic Executive Function: Self Monitoring Self Monitoring: Impaired Self Monitoring Impairment: Functional basic;Verbal basic Behaviors: Perseveration Safety/Judgment: Impaired    Comprehension  Auditory Comprehension Overall Auditory Comprehension: Appears within functional limits for tasks assessed    Expression Verbal Expression Overall Verbal Expression: Impaired Initiation: No impairment Automatic Speech: Name;Social Response Level of Generative/Spontaneous Verbalization: Conversation Repetition: No impairment Naming: Impairment Responsive: Not tested Confrontation: Not tested Convergent: Not tested Divergent: 25-49% accurate Pragmatics: No impairment   Oral / Motor  Oral Motor/Sensory Function Overall Oral Motor/Sensory Function: Within functional limits Motor Speech Overall Motor Speech: Impaired at baseline (frontal lisp)   GO                   Herbie Baltimore, MA CCC-SLP [phone]  Lynann Beaver 03/02/2016, 10:41 AM

## 2016-03-03 ENCOUNTER — Encounter: Payer: Self-pay | Admitting: Family Medicine

## 2016-03-03 ENCOUNTER — Other Ambulatory Visit: Payer: Medicare Other

## 2016-03-03 ENCOUNTER — Telehealth: Payer: Self-pay | Admitting: Behavioral Health

## 2016-03-03 ENCOUNTER — Telehealth: Payer: Self-pay

## 2016-03-03 ENCOUNTER — Ambulatory Visit: Payer: Medicare Other | Admitting: Family

## 2016-03-03 ENCOUNTER — Telehealth: Payer: Self-pay | Admitting: Internal Medicine

## 2016-03-03 ENCOUNTER — Ambulatory Visit (INDEPENDENT_AMBULATORY_CARE_PROVIDER_SITE_OTHER): Payer: Medicare Other | Admitting: Family Medicine

## 2016-03-03 VITALS — BP 114/60 | HR 97 | Temp 98.2°F | Ht 64.0 in | Wt 118.1 lb

## 2016-03-03 DIAGNOSIS — K7682 Hepatic encephalopathy: Secondary | ICD-10-CM

## 2016-03-03 DIAGNOSIS — E118 Type 2 diabetes mellitus with unspecified complications: Secondary | ICD-10-CM | POA: Diagnosis not present

## 2016-03-03 DIAGNOSIS — K7290 Hepatic failure, unspecified without coma: Secondary | ICD-10-CM

## 2016-03-03 DIAGNOSIS — K219 Gastro-esophageal reflux disease without esophagitis: Secondary | ICD-10-CM | POA: Diagnosis not present

## 2016-03-03 DIAGNOSIS — E1159 Type 2 diabetes mellitus with other circulatory complications: Secondary | ICD-10-CM | POA: Diagnosis not present

## 2016-03-03 DIAGNOSIS — R030 Elevated blood-pressure reading, without diagnosis of hypertension: Secondary | ICD-10-CM

## 2016-03-03 DIAGNOSIS — I639 Cerebral infarction, unspecified: Secondary | ICD-10-CM | POA: Diagnosis not present

## 2016-03-03 DIAGNOSIS — G9340 Encephalopathy, unspecified: Secondary | ICD-10-CM

## 2016-03-03 DIAGNOSIS — IMO0001 Reserved for inherently not codable concepts without codable children: Secondary | ICD-10-CM

## 2016-03-03 LAB — CULTURE, BLOOD (ROUTINE X 2)
CULTURE: NO GROWTH
Culture: NO GROWTH

## 2016-03-03 MED ORDER — POTASSIUM CHLORIDE ER 10 MEQ PO TBCR: 10.0000 meq | EXTENDED_RELEASE_TABLET | Freq: Two times a day (BID) | ORAL | Status: AC

## 2016-03-03 MED ORDER — LACTULOSE 10 GM/15ML PO SOLN: 20.0000 g | Freq: Three times a day (TID) | ORAL | Status: DC

## 2016-03-03 MED ORDER — RIFAXIMIN 200 MG PO TABS: 200.0000 mg | ORAL_TABLET | Freq: Three times a day (TID) | ORAL | Status: DC

## 2016-03-03 NOTE — Patient Instructions (Signed)
Hepatic Encephalopathy Hepatic encephalopathy is a loss of brain function from advanced liver disease. The effects of the condition depend on the type of liver damage and how severe it is. In some cases, hepatic encephalopathy can be reversed. CAUSES The exact cause of hepatic encephalopathy is not known. RISK FACTORS You have a higher risk of getting this condition if your liver is damaged. When the liver is damaged harmful substances called toxins can build up in the body. Certain toxins, such as ammonia, can harm your brain. Conditions that can cause liver damage include:  An infection.  Dehydration.  Intestinal bleeding.  Drinking too much alcohol.  Taking certain medicines, including tranquilizers, water pills (diuretics), antidepressants, or sleeping pills. SIGNS AND SYMPTOMS Signs and symptoms may develop suddenly. Or, they may develop slowly and get worse gradually. Symptoms can range from mild to severe. Mild Hepatic Encephalopathy  Mild confusion.  Personality and mood changes.  Anxiety and agitation.  Drowsiness.  Loss of mental abilities.  Musty or sweet-smelling breath. Worsening or Severe Hepatic Encephalopathy  Slowed movement.  Slurred speech.  Extreme personality changes.  Disorientation.  Abnormal shaking or flapping of the hands.  Coma. DIAGNOSIS To make a diagnosis, your health care provider will do a physical exam. To rule out other causes of your signs and symptoms, he or she may order tests. You may have:  Blood tests. These may be done to check your ammonia level, measure how long it takes your blood to clot, and check for infection.  Liver function tests. These may be done to check how well your liver is working.  MRI and CT scans. These may be done to check for a brain disorder.  Electroencephalogram (EEG). This may be done to measure the electrical activity in your brain. TREATMENT The first step in treatment is identifying and  treating possible triggers. The next step is involves taking medicine to lower the level of toxins in the body and to prevent ammonia from building up. You may need to take:  Antibiotics to reduce the ammonia-producing bacteria in your gut.  Lactulose to help flush ammonia from the gut. HOME CARE INSTRUCTIONS Eating and Drinking  Follow a low-protein diet that includes plenty of fruits, vegetables, and whole grains, as directed by your health care provider. Ammonia is produced when you digest high-protein foods.  Work with a dietitian or with your health care provider to make sure you are getting the right balance of protein and minerals.  Drink enough fluids to keep your urine clear or pale yellow. Drinking plenty of water helps prevent constipation.  Do not drink alcohol or use illegal drugs. Medicines  Only take medicine as directed by your health care provider.  If you were prescribed an antibiotic medicine, finish it all even if you start to feel better.  Do not start any new medicines, including over-the-counter medicines, without first checking with your health care provider. SEEK MEDICAL CARE IF:  You have new symptoms.  Your symptoms change.  Your symptoms get worse.  You have a fever.  You are constipated.  You have persistent nausea, vomiting, or diarrhea. SEEK IMMEDIATE MEDICAL CARE IF:  You become very confused or drowsy.  You vomit blood or material that looks like coffee grounds.  Your stool is bloody or black or looks like tar.   This information is not intended to replace advice given to you by your health care provider. Make sure you discuss any questions you have with your health care provider.     Document Released: 12/01/2006 Document Revised: 10/12/2014 Document Reviewed: 05/09/2014 Elsevier Interactive Patient Education 2016 [employer]  

## 2016-03-03 NOTE — Telephone Encounter (Signed)
Transition Care Management Follow-up Telephone Call  PCP: Penni Homans, MD  Admit date: 02/27/2016 Discharge date: 03/02/2016    Recommendations for Outpatient Follow-up:  1. Follow upw ith Dr Henrene Pastor in one week.  2. Please follow up with PCP in 1 to 2 weeks post hospitalization visit.   Discharge Diagnoses:  Principal Problem:  Encephalopathy, hepatic (HCC) Active Problems:  Mild cognitive disorder  Diabetes mellitus type 2, uncomplicated (HCC)  Acute encephalopathy  Depression with anxiety  Lactic acidosis  Acute CHF (congestive heart failure) ([place])  Discharge Condition: improved   How have you been since you were released from the hospital? Per the patient's daughter, "She's doing fine".   Do you understand why you were in the hospital? yes   Do you understand the discharge instructions? yes   Where were you discharged to? Home   Items Reviewed:  Medications reviewed: yes, per the patient's daughter there were two new medications added to her current list, which are Lactulose & Rifaximin (this medication requires prior auth, therefore the patient does not currently take this medication).  Allergies reviewed: yes  Dietary changes reviewed: no, per the patient's daughter there was no change in diet, she still consumes a regular diet.  Referrals reviewed: yes, follow-up with PCP & Dr. Henrene Pastor (03/05/16 at 11:00 AM).   Functional Questionnaire:   Activities of Daily Living (ADLs):   She states they are independent in the following: Per the patient's daughter, she requires assistance with PT, OT, speech, in which a home health agency comes out to her house. States they require assistance with the following: ambulation, bathing and hygiene, feeding, continence, grooming, toileting and dressing   Any transportation issues/concerns?: no   Any patient concerns? no   Confirmed importance and date/time of follow-up visits scheduled yes, 03/03/16 at 5:30  PM.  Provider Appointment booked with Dr. Charlett Blake.  Confirmed with patient if condition begins to worsen call PCP or go to the ER.  Patient was given the office number and encouraged to call back with question or concerns.  : yes

## 2016-03-03 NOTE — Telephone Encounter (Signed)
Please offer 03/05/16 at 11:00 pm with Monique Bogus, PA. Dr Henrene Pastor does not have any openings but he will be in the office that morning.

## 2016-03-03 NOTE — Telephone Encounter (Signed)
Rn call patient and notified her daughter Monique Fleming on [place] that her moms vitamin d was normal. Pts daughter verbalized understanding.

## 2016-03-03 NOTE — Discharge Summary (Signed)
Physician Discharge Summary  Monique Fleming [id number] DOB: [birth year] DOA: 02/27/2016  PCP: Penni Homans, MD  Admit date: 02/27/2016 Discharge date: 03/02/2016  Time spent: 25 minutes  Recommendations for Outpatient Follow-up:  1. Follow upw ith Dr Henrene Pastor in one week.  2. Please follow up with PCP in 1 to 2 weeks post hospitalization visit.    Discharge Diagnoses:  Principal Problem:   Encephalopathy, hepatic (HCC) Active Problems:   Mild cognitive disorder   Diabetes mellitus type 2, uncomplicated (HCC)   Acute encephalopathy   Depression with anxiety   Lactic acidosis   Acute CHF (congestive heart failure) ([place])   Discharge Condition: improved  Diet recommendation: regular  Filed Weights   03/01/16 0443 03/01/16 0641 03/02/16 0416  Weight: 52.436 kg (115 lb 9.6 oz) 49.6 kg (109 lb 5.6 oz) 52.617 kg (116 lb)    History of present illness:  Monique Fleming is a 82 y.o. female with medical history significant of diabetes mellitus type 2, HTN, COPD, anxiety/depression, aortic stenosis, and mild cognitive impairment; who presents after being found in altered mental status.   Hospital Course:  Acute encephalopathy: Probably sec to hepatic encephalopathy, she is clearing up. On lactulose.  Repeat ammonia levels in am show improvement . She is alert and oriented at the time of discharge.  Increase the lactulose and added rifaximin.  MRI brain and EEG done, negative for stroke and seizure activity.    Diabetes Mellitus: CBG (last 3)   Recent Labs (last 2 labs)      Recent Labs  02/29/16 1651  GLUCAP 138*      Resume SSI.   Lactic acidosis: normalized .   Acute Diastolic [id number] her on low dose lasix and monitor renal function on lasix.  Repeat CXR inam shows improvement.    Prolonged QT Interval: - repeat EKG shows improvement.  Sinus pain and pressure: X rays do not show any opacification.         Procedures:  none  Consultations:  none  Discharge Exam: Filed Vitals:   03/02/16 0407 03/02/16 0541  BP: 115/44 123/47  Pulse: 97 98  Temp: 97.6 F (36.4 C) 98.8 F (37.1 C)  Resp: 20 22    General: alert comfortable.  Cardiovascular:  s1s2 Respiratory: ctab.   Discharge Instructions   Discharge Instructions    Call MD for:  difficulty breathing, headache or visual disturbances    Complete by:  As directed      Call MD for:  extreme fatigue    Complete by:  As directed      Call MD for:  persistant dizziness or light-headedness    Complete by:  As directed      Call MD for:  persistant nausea and vomiting    Complete by:  As directed      Diet - low sodium heart healthy    Complete by:  As directed      Discharge instructions    Complete by:  As directed   Please follow up with Dr Henrene Pastor in one week.  please follow up with PCP in 2 weeks post hospitalization visit.          Discharge Medication List as of 03/02/2016 12:16 PM    START taking these medications   Details  lactulose (CHRONULAC) 10 GM/15ML solution Take 30 mLs (20 g total) by mouth 2 (two) times daily., Starting 03/02/2016, Until Discontinued, Print    rifaximin (XIFAXAN) 550 MG TABS tablet Take 1 tablet (  550 mg total) by mouth 2 (two) times daily., Starting 03/02/2016, Until Discontinued, Print      CONTINUE these medications which have CHANGED   Details  Magnesium 400 MG TABS Take 400 mg by mouth 2 (two) times daily., Starting 03/02/2016, Until Discontinued, Normal      CONTINUE these medications which have NOT CHANGED   Details  albuterol (PROAIR HFA) 108 (90 BASE) MCG/ACT inhaler Inhale 2 puffs into the lungs every 4 (four) hours as needed for wheezing or shortness of breath., Starting 11/12/2014, Until Discontinued, Normal    cholestyramine (QUESTRAN) 4 g packet MIX 1 PACKET (4 GRAM TOTAL) IN LIQUID AND DRINK DAILY AS NEEDED FOR DIARRHEA, Normal    clonazePAM (KLONOPIN) 1 MG tablet  Take 1 tablet (1 mg total) by mouth 2 (two) times daily as needed for anxiety., Starting 01/22/2016, Until Discontinued, Print    clopidogrel (PLAVIX) 75 MG tablet TAKE 1 TABLET DAILY, Normal    desvenlafaxine (PRISTIQ) 100 MG 24 hr tablet Take 100 mg by mouth daily., Until Discontinued, Historical Med    fluticasone (FLONASE) 50 MCG/ACT nasal spray USE 2 SPRAYS IN EACH NOSTRIL DAILY AS NEEDED FOR ALLERGIES, Normal    montelukast (SINGULAIR) 10 MG tablet Take 10 mg by mouth at bedtime., Until Discontinued, Historical Med    Multiple Vitamins-Minerals (OCUVITE PRESERVISION) TABS Take by mouth 2 (two) times daily., Until Discontinued, Historical Med    Nutritional Supplements (CARNATION INSTANT BREAKFAST PO) Take 1 each by mouth 3 (three) times daily., Until Discontinued, Historical Med    ondansetron (ZOFRAN) 4 MG tablet Take 1 tablet (4 mg total) by mouth every 6 (six) hours as needed for nausea or vomiting., Starting 03/14/2015, Until Discontinued, Normal    pantoprazole (PROTONIX) 40 MG tablet TAKE 1 TABLET EVERY MORNING, Normal    phenytoin (DILANTIN) 100 MG ER capsule Take 2 capsules (200 mg total) by mouth at bedtime. Reported on 02/13/2016, Starting 02/13/2016, Until Discontinued, Normal    Polyethyl Glycol-Propyl Glycol (SYSTANE) 0.4-0.3 % SOLN Place 1 drop into both eyes 2 (two) times daily as needed (dry eyes). , Until Discontinued, Historical Med    potassium chloride (K-DUR) 10 MEQ tablet Take 10 mEq by mouth 2 (two) times daily., Until Discontinued, Historical Med    saccharomyces boulardii (FLORASTOR) 250 MG capsule Take 250 mg by mouth daily., Until Discontinued, Historical Med    sodium chloride (OCEAN) 0.65 % SOLN nasal spray Place 2 sprays into both nostrils daily as needed for congestion. , Until Discontinued, Historical Med    traMADol (ULTRAM) 50 MG tablet Take 1 tablet (50 mg total) by mouth every 12 (twelve) hours as needed for severe pain., Starting 01/22/2016, Until  Discontinued, Print    formoterol (PERFOROMIST) 20 MCG/2ML nebulizer solution Take 2 mLs (20 mcg total) by nebulization 2 (two) times daily., Starting 01/14/2015, Until Discontinued, Print    glucose blood (BAYER CONTOUR NEXT TEST) test strip Use as directed once daily to check blood sugar.  DX E11.9, Normal      STOP taking these medications     magnesium oxide (MAG-OX) 400 MG tablet      hydrocortisone (ANUSOL-HC) 25 MG suppository      KLOR-CON M10 10 MEQ tablet      memantine (NAMENDA TITRATION PAK) tablet pack      Protein (PROCEL) POWD      UNABLE TO FIND        Allergies  Allergen Reactions  . Bactrim [Sulfamethoxazole-Trimethoprim] Shortness Of Breath  . Levofloxacin Other (  See Comments)    seizure  . Pneumovax [Pneumococcal Polysaccharide Vaccine] Other (See Comments)    Severe allergy-cellulitis  . Amoxicillin-Pot Clavulanate Other (See Comments)    Patient stated Drs. recommend taking Augmentin due to her PCN allergy.  . Penicillins Rash    Has patient had a PCN reaction causing immediate rash, facial/tongue/throat swelling, SOB or lightheadedness with hypotension: No Has patient had a PCN reaction causing severe rash involving mucus membranes or skin necrosis: No Has patient had a PCN reaction that required hospitalization No Has patient had a PCN reaction occurring within the last 10 years: No If all of the above answers are "NO", then may proceed with Cephalosporin use.   . Bupropion   . Haldol [Haloperidol Lactate]     Had unknown reaction many yrs ago per pt  . Haloperidol   . Morphine   . Morphine And Related     Confused and agitation  . Pneumococcal Vaccine   . Pseudoephedrine Hcl   . Sudafed [Pseudoephedrine Hcl] Other (See Comments)    seizure   Follow-up Information    Follow up with [facility].   Specialty:  Home Health Services   Why:  to resume [facility] RN HHA PT OT SLP at DC.   Contact information:   [place] [address] [phone]       Follow up with [facility].   Why:  RW to be delivered to room prior to discharge   Contact information:   [address] [place] [address] [phone]       Follow up with Penni Homans, MD. Schedule an appointment as soon as possible for a visit in 2 weeks.   Specialty:  Family Medicine   Why:  post hospitalization visit.    Contact information:   [place] [address] [phone]       Follow up with Scarlette Shorts, MD. Schedule an appointment as soon as possible for a visit in 1 week.   Specialty:  Gastroenterology   Contact information:   [address] [place] [address] [phone]        The results of significant diagnostics from this hospitalization (including imaging, microbiology, ancillary and laboratory) are listed below for reference.    Significant Diagnostic Studies: Dg Sinuses Complete  03/01/2016  CLINICAL DATA:  Frontal sinus pain and left nasal congestion EXAM: PARANASAL SINUSES - COMPLETE 3 + VIEW COMPARISON:  None. FINDINGS: Normally pneumatized paranasal sinuses with no mucosal thickening or air-fluid levels seen. The patient is edentulous. IMPRESSION: No evidence of sinusitis. Electronically Signed   By: Claudie Revering M.D.   On: 03/01/2016 12:31   Dg Chest 2 View  03/02/2016  CLINICAL DATA:  Shortness of Breath EXAM: CHEST  2 VIEW COMPARISON:  02/27/2016 FINDINGS: Cardiac shadow is within normal limits. Mild interstitial changes are again seen and stable. No focal infiltrate or sizable effusion is noted. No bony abnormality is seen. IMPRESSION: Stable interstitial changes without acute abnormality. Electronically Signed   By: Inez Catalina M.D.   On: 03/02/2016 07:38   Dg Chest 2 View  02/27/2016  CLINICAL DATA:  Worsening mental status over the last 2 days. History of diabetes, aortic stenosis, transient ischemic attacks and seizures. EXAM:  CHEST  2 VIEW COMPARISON:  01/18/2016 and 05/25/2015. FINDINGS: The heart size and mediastinal contours are stable with mild aortic atherosclerosis. Mildly increased interstitial  prominence is noted, best seen on the lateral view. There is no confluent airspace opacity or significant pleural effusion. Underlying emphysematous changes are present. The bones appear unchanged. Telemetry leads overlie the chest. IMPRESSION: Mildly increased interstitial prominence compared with most recent prior studies, suspicious for mild edema superimposed on emphysema. No focal airspace disease. Electronically Signed   By: Richardean Sale M.D.   On: 02/27/2016 15:11   Ct Head Wo Contrast  02/27/2016  CLINICAL DATA:  Worsening altered mental status over the past 2 days. Hypertension. EXAM: CT HEAD WITHOUT CONTRAST TECHNIQUE: Contiguous axial images were obtained from the base of the skull through the vertex without intravenous contrast. COMPARISON:  Head CT dated 01/18/2016 and brain MR dated 01/19/2016. FINDINGS: Diffusely enlarged ventricles and subarachnoid spaces. Patchy white matter low density in both cerebral hemispheres. No intracranial hemorrhage, mass lesion or CT evidence of acute infarction. Unremarkable bones and included paranasal sinuses. IMPRESSION: 1. No acute abnormality. 2. Stable atrophy and chronic small vessel white matter ischemic changes. Electronically Signed   By: Claudie Revering M.D.   On: 02/27/2016 15:12   Mr Brain Wo Contrast  02/28/2016  CLINICAL DATA:  82 year old diabetic female with altered mental status. Subsequent encounter. EXAM: MRI HEAD WITHOUT CONTRAST TECHNIQUE: Multiplanar, multiecho pulse sequences of the brain and surrounding structures were obtained without intravenous contrast. COMPARISON:  02/27/2016 CT.  01/19/2016 MR. FINDINGS: No acute infarct. Multiple scattered blood breakdown products probably related to result of remote hemorrhagic ischemia/ chronic microvascular changes.  Result of cavernomas or prior trauma felt to be less likely considerations. Appearance unchanged. Moderate chronic microvascular white matter changes. Global atrophy without hydrocephalus. No intracranial mass lesion noted on this unenhanced exam. Major intracranial vascular structures are patent. Decreased signal intensity of bone marrow may be related to patient's habitus. Correlation with CBC to exclude anemia contributing to this appearance may be considered Cervical medullary junction unremarkable. IMPRESSION: No acute infarct. Otherwise, findings similar to the recent brain MR as detailed above. Electronically Signed   By: Genia Del M.D.   On: 02/28/2016 20:46   US Abdomen Complete  02/28/2016  CLINICAL DATA:  Hepatic encephalopathy. Cirrhosis. History of diabetes. EXAM: ABDOMEN ULTRASOUND COMPLETE COMPARISON:  08/21/2014 FINDINGS: Gallbladder: Status post cholecystectomy. Common bile duct: Diameter: 4.7 mm Liver: Heterogeneous echotexture. Nodular surface. Numerous hyperechoic foci throughout the liver parenchyma. No discrete solid or cystic lesions. IVC: No abnormality visualized. Pancreas: Visualized portion unremarkable. Spleen: 13.2 cm.  Volume is 570.6 cubic cm. Right Kidney: Length: 10.3 cm. Echogenicity within normal limits. Midpole cyst is 3.0 x 3.1 x 3.5 cm. Left Kidney: Length: 12.2 cm. Two adjacent cysts are identified. Each contains a single internal septation. These Measure 6.6 x 6.6 x 6.3 cm and 5.0 x 4.9 x 5.4 cm. Abdominal aorta: 3.1 cm distal aspect of the aorta is difficult to evaluate because of patient tolerance. Atherosclerotic calcifications noted. Other findings: Small amount of ascites. IMPRESSION: 1. Cirrhotic changes in the liver. 2. Numerous echogenic foci throughout the liver, consistent with granulomata or fibrosis. 3. Splenomegaly consistent with portal venous hypertension. 4. Status post cholecystectomy. 5. Bilateral renal cysts with benign features. 6. Small amount of  ascites. Electronically Signed   By: Nolon Nations M.D.   On: 02/28/2016 10:32    Microbiology: Recent Results (from the past 240 hour(s))  Culture, blood (routine x 2)     Status: None (Preliminary result)   Collection Time: 02/27/16  3:49 PM  Result Value Ref Range Status   Specimen Description  BLOOD RT HAND  Final   Special Requests BOTTLES DRAWN AEROBIC AND ANAEROBIC 5CC  Final   Culture   Final    NO GROWTH 4 DAYS Performed at [facility]    Report Status PENDING  Incomplete  Culture, blood (routine x 2)     Status: None (Preliminary result)   Collection Time: 02/27/16  3:50 PM  Result Value Ref Range Status   Specimen Description BLOOD LEFT AC  Final   Special Requests BOTTLES DRAWN AEROBIC AND ANAEROBIC 5CC  Final   Culture   Final    NO GROWTH 4 DAYS Performed at [facility]    Report Status PENDING  Incomplete     Labs: Basic Metabolic Panel:  Recent Labs Lab 02/27/16 1520 02/28/16 0205 02/29/16 0950 03/01/16 0427 03/02/16 0427  NA 140 141 137  --   --   K 3.9 3.7 4.2  --   --   CL 107 108 106  --   --   CO2 25 27 25   --   --   GLUCOSE 114* 133* 160*  --   --   BUN 22* 13 15  --   --   CREATININE 0.85 0.79 0.89  --   --   CALCIUM 8.9 9.1 8.6*  --   --   MG 1.4* 1.5* 1.8 2.0 2.0   Liver Function Tests:  Recent Labs Lab 02/27/16 1520 02/29/16 0950  AST 59* 45*  ALT 27 23  ALKPHOS 107 102  BILITOT 0.6 0.6  PROT 6.4* 6.1*  ALBUMIN 3.0* 2.8*   No results for input(s): LIPASE, AMYLASE in the last 168 hours.  Recent Labs Lab 02/27/16 1520 02/28/16 0205 02/29/16 0950 03/01/16 0427 03/02/16 0427  AMMONIA 99* 90* 122* 144* 111*   CBC:  Recent Labs Lab 02/27/16 1520 02/28/16 0205 02/29/16 0950  WBC 4.4 6.0 5.2  NEUTROABS 2.9  --   --   HGB 11.1* 10.3* 10.3*  HCT 34.4* 33.5* 33.3*  MCV 96.4 93.1 94.6  PLT 112* 133* 120*   Cardiac Enzymes:  Recent Labs Lab 02/27/16 1520 02/27/16 2107 02/27/16 2314 02/28/16 0205   TROPONINI 0.06* 0.06* 0.06* 0.06*   BNP: BNP (last 3 results)  Recent Labs  05/25/15 1845 02/27/16 1520  BNP 630.3* 535.4*    ProBNP (last 3 results) No results for input(s): PROBNP in the last 8760 hours.  CBG:  Recent Labs Lab 02/27/16 1521 02/29/16 1651  GLUCAP 108* 138*       Signed:  Elan Mcelvain MD.  [facility] 03/03/2016, 9:06 AM

## 2016-03-03 NOTE — Progress Notes (Signed)
Pre visit review using our clinic review tool, if applicable. No additional management support is needed unless otherwise documented below in the visit note. 

## 2016-03-03 NOTE — Telephone Encounter (Signed)
-----   Message from [name], MD sent at 02/28/2016  4:04 PM EDT ----- Monique Fleming inform the patient and vitamin D levels were normal

## 2016-03-04 ENCOUNTER — Telehealth: Payer: Self-pay | Admitting: *Deleted

## 2016-03-04 NOTE — Telephone Encounter (Signed)
PA initiated on [email or web address]/[facility], awaiting determination. JG//CMA   Covered alternatives include noroxin 400mg , ofloxacin 200/300/400 mg, ciprofloxacin 250/500/750 mg, and Levofloxacin 250mg .

## 2016-03-04 NOTE — Telephone Encounter (Signed)
PA approved effective through 03/04/17. Approval letter sent for scanning. JG//CMA

## 2016-03-05 ENCOUNTER — Encounter: Payer: Self-pay | Admitting: Neurology

## 2016-03-05 ENCOUNTER — Telehealth: Payer: Self-pay | Admitting: *Deleted

## 2016-03-05 ENCOUNTER — Other Ambulatory Visit: Payer: Self-pay | Admitting: *Deleted

## 2016-03-05 ENCOUNTER — Telehealth: Payer: Self-pay | Admitting: Family Medicine

## 2016-03-05 ENCOUNTER — Ambulatory Visit (INDEPENDENT_AMBULATORY_CARE_PROVIDER_SITE_OTHER): Payer: Medicare Other | Admitting: Gastroenterology

## 2016-03-05 ENCOUNTER — Other Ambulatory Visit (INDEPENDENT_AMBULATORY_CARE_PROVIDER_SITE_OTHER): Payer: Medicare Other

## 2016-03-05 VITALS — BP 106/60 | HR 84

## 2016-03-05 DIAGNOSIS — IMO0001 Reserved for inherently not codable concepts without codable children: Secondary | ICD-10-CM

## 2016-03-05 DIAGNOSIS — K7460 Unspecified cirrhosis of liver: Secondary | ICD-10-CM

## 2016-03-05 DIAGNOSIS — I639 Cerebral infarction, unspecified: Secondary | ICD-10-CM | POA: Diagnosis not present

## 2016-03-05 DIAGNOSIS — K7290 Hepatic failure, unspecified without coma: Secondary | ICD-10-CM

## 2016-03-05 DIAGNOSIS — K7682 Hepatic encephalopathy: Secondary | ICD-10-CM

## 2016-03-05 DIAGNOSIS — R030 Elevated blood-pressure reading, without diagnosis of hypertension: Secondary | ICD-10-CM | POA: Diagnosis not present

## 2016-03-05 DIAGNOSIS — E785 Hyperlipidemia, unspecified: Secondary | ICD-10-CM | POA: Diagnosis not present

## 2016-03-05 DIAGNOSIS — E118 Type 2 diabetes mellitus with unspecified complications: Secondary | ICD-10-CM

## 2016-03-05 DIAGNOSIS — E119 Type 2 diabetes mellitus without complications: Secondary | ICD-10-CM | POA: Diagnosis not present

## 2016-03-05 LAB — LIPID PANEL
CHOL/HDL RATIO: 4
Cholesterol: 129 mg/dL (ref 0–200)
HDL: 33.1 mg/dL — AB (ref >39.00)
LDL Cholesterol: 68 mg/dL (ref 0–99)
NonHDL: 95.52
TRIGLYCERIDES: 136 mg/dL (ref 0.0–149.0)
VLDL: 27.2 mg/dL (ref 0.0–40.0)

## 2016-03-05 LAB — COMPREHENSIVE METABOLIC PANEL
ALK PHOS: 106 U/L (ref 39–117)
ALT: 24 U/L (ref 0–35)
AST: 54 U/L — ABNORMAL HIGH (ref 0–37)
Albumin: 3.3 g/dL — ABNORMAL LOW (ref 3.5–5.2)
BILIRUBIN TOTAL: 0.4 mg/dL (ref 0.2–1.2)
BUN: 19 mg/dL (ref 6–23)
CALCIUM: 9 mg/dL (ref 8.4–10.5)
CO2: 27 meq/L (ref 19–32)
Chloride: 104 mEq/L (ref 96–112)
Creatinine, Ser: 0.9 mg/dL (ref 0.40–1.20)
GFR: 63.32 mL/min (ref >60.00)
Glucose, Bld: 290 mg/dL — ABNORMAL HIGH (ref 70–99)
POTASSIUM: 4.6 meq/L (ref 3.5–5.1)
Sodium: 137 mEq/L (ref 135–145)
Total Protein: 6.7 g/dL (ref 6.0–8.3)

## 2016-03-05 LAB — AMMONIA: Ammonia: 100 umol/L — ABNORMAL HIGH (ref 11–35)

## 2016-03-05 LAB — PROTIME-INR
INR: 1.3 ratio — ABNORMAL HIGH (ref 0.8–1.0)
Prothrombin Time: 14.1 s — ABNORMAL HIGH (ref 9.6–13.1)

## 2016-03-05 LAB — CBC
HCT: 30.8 % — ABNORMAL LOW (ref 36.0–46.0)
Hemoglobin: 10.4 g/dL — ABNORMAL LOW (ref 12.0–15.0)
MCHC: 33.7 g/dL (ref 30.0–36.0)
MCV: 91.9 fl (ref 78.0–100.0)
PLATELETS: 156 10*3/uL (ref 150.0–400.0)
RBC: 3.35 Mil/uL — AB (ref 3.87–5.11)
RDW: 17.5 % — ABNORMAL HIGH (ref 11.5–15.5)
WBC: 6.6 10*3/uL (ref 4.0–10.5)

## 2016-03-05 LAB — TSH: TSH: 1.36 u[IU]/mL (ref 0.35–4.50)

## 2016-03-05 LAB — VITAMIN D 25 HYDROXY (VIT D DEFICIENCY, FRACTURES): VITD: 23.63 ng/mL — AB (ref 30.00–100.00)

## 2016-03-05 LAB — HEMOGLOBIN A1C: HEMOGLOBIN A1C: 5.1 % (ref 4.6–6.5)

## 2016-03-05 MED ORDER — RIFAXIMIN 550 MG PO TABS: 550.0000 mg | ORAL_TABLET | Freq: Two times a day (BID) | ORAL | Status: DC

## 2016-03-05 MED ORDER — RANITIDINE HCL 300 MG PO TABS: ORAL_TABLET | ORAL | Status: AC

## 2016-03-05 MED ORDER — LACTULOSE 10 GM/15ML PO SOLN: ORAL | Status: DC

## 2016-03-05 NOTE — Telephone Encounter (Signed)
Called in script for Lactulose to Sam, at [facility].  Ordered 45 ml's x 3 times daily = 135 ml x 30 days is 4050 ml.  Since this is such a huge amount, they will give her one bottle at a time.

## 2016-03-05 NOTE — Telephone Encounter (Signed)
Monique Fleming needs verbal for PT twice a week for 4 weeks and eval for skilled nursing. Ok to leave a detailed message.

## 2016-03-05 NOTE — Telephone Encounter (Signed)
Spoke to [facility] and this message was from last week. Patient since has been to the hospital, seen PCP and BP today great.

## 2016-03-05 NOTE — Telephone Encounter (Signed)
OK to give verbal order for ST as requested

## 2016-03-05 NOTE — Telephone Encounter (Signed)
Called [facility] left a detailed message of verbal ok.

## 2016-03-05 NOTE — Patient Instructions (Addendum)
Please go to the basement level to have your labs drawn.  We sent Lactulose liquid to [facility].  We are faxing the Xifaxan 550 mg to [facility].  They will be calling you.  We sent the Zantac to [facility].    Stop Pantoprazole sodium 40 mg.  We made you an appointment with Dr. Henrene Pastor for 7-17 at 9:00 am.

## 2016-03-05 NOTE — Telephone Encounter (Signed)
Caller name: Tresa Moore, ST with [facility] Can be reached: [phone]  Reason for call: Calling to request 4 f/u visit for ST, 2x week for 2 weeks. Please call with VO. Ok to leave on VM.

## 2016-03-05 NOTE — Telephone Encounter (Signed)
If they get more concerned she may need to be evaluated in ER but lab work was actually improving when she was checked earlier today. Likely she is worn out from the activity, recheck BP in am and let us know what she is then. If she becomes more symptomatic take her to get looked at.

## 2016-03-05 NOTE — Telephone Encounter (Signed)
OK to give verbal order as requested.

## 2016-03-05 NOTE — Telephone Encounter (Signed)
Called [facility] left detailed message of PCP ok. 

## 2016-03-06 ENCOUNTER — Telehealth: Payer: Self-pay | Admitting: Gastroenterology

## 2016-03-06 ENCOUNTER — Encounter: Payer: Self-pay | Admitting: Family Medicine

## 2016-03-06 LAB — AFP TUMOR MARKER: AFP TUMOR MARKER: 3.9 ng/mL (ref <6.1)

## 2016-03-06 NOTE — Telephone Encounter (Signed)
Called Jolene at [employer] and advised the note is not finished. I advised I will fax the insurance card and office noted once it is complete.

## 2016-03-06 NOTE — Telephone Encounter (Signed)
Called the patient's daughter [name] to advise her I am in contact with [facility] regarding the Xifaxan 550 mg medication.  They will do a prior authoirzation with the insurance company. They will call and let the patient know what her copay would be.  [name] thanked me for calling.

## 2016-03-08 NOTE — Progress Notes (Signed)
Patient ID: TAIYLOR VIRDEN, female   DOB: [birth year], 82 y.o.   MRN: [id number]   Subjective:    Patient ID: Bennie Hind, female    DOB: 1929-07-28, 82 y.o.   MRN: [id number]  Chief Complaint  Patient presents with  . Hospitalization Follow-up    HPI Patient is in today for hospitalization. She was admitted with confusion and found to have hepatic encephalopathy. She is accompanied by her daughter who is acknowledging she is improving but slowly since her recent hospitalization the confusion is improvin slowly. She is set up to see pulmonology next week, ENT on 6/21. Gastroenterology next week and neuro at [facility]. Denies CP/palp/HA/congestion/fevers/GI or GU c/o. Taking meds as prescribed  Past Medical History  Diagnosis Date  . Aortic stenosis     moderate by echo 6/11 gradient 20/66mHg for mean and peak  . TIA (transient ischemic attack)     Dr. SLeonie Man . Peripheral vascular disease ([place]   . IBS (irritable bowel syndrome)   . Diabetes mellitus     type II  . Chronic bronchitis   . COPD (chronic obstructive pulmonary disease) ([place]   . Atherosclerosis   . Depression   . Anxiety     Dr PCasimiro Needle . Renal cyst   . Anemia     iron deficiency  . Cellulitis   . Diverticular disease   . Infectious diarrhea(009.2)   . Sinusitis   . Thrush   . Abnormal liver function test   . GERD (gastroesophageal reflux disease)   . Insomnia     chronic  . Adenocarcinoma ([place] 2008    colon,s/p right hemicolectomy  . Angina   . Seizures (HCC)     hx of one seizure "  . Cirrhosis of liver (HCC)     hx of  . Clostridium difficile diarrhea   . Thrombocytopenia ([place] 04/01/2013  . Bursitis     spine per pt  . Macular degeneration 02/18/2014    [facility] . Internal [facility]oids   . Asthma   . Elevated BP 07/09/2015  . Type 2 diabetes mellitus with vascular disease ([place] 03/02/2007    Chronic    . Encephalopathy, metabolic   . Protein calorie malnutrition ([facility]     Past  Surgical History  Procedure Laterality Date  . Carotid endarterectomy    . Colectomy      right hemi  . Cholecystectomy  11/09  . Upper gastrointestinal endoscopy    . Tonsillectomy    . Appendectomy      Family History  Problem Relation Age of Onset  . Colon cancer Paternal Grandmother   . Esophageal cancer Brother   . Heart disease Mother   . Diabetes Brother   . Hypertension Father   . Rectal cancer Neg Hx   . Stomach cancer Neg Hx     Social History   Social History  . Marital Status: Widowed    Spouse Name: N/A  . Number of Children: 3  . Years of Education: N/A   Occupational History  . retired   .     Social History Main Topics  . Smoking status: Former Smoker -- 0.50 packs/day for 70 years    Types: Cigarettes    Start date: 02/14/1951    Quit date: 01/25/2016  . Smokeless tobacco: Never Used     Comment: smokes 1-2 cigarettes daily  . Alcohol Use: No  . Drug Use: No  . Sexual Activity: No  Other Topics Concern  . Not on file   Social History Narrative   Patient signed a Designated Party Release to allow her daughter, Lilyanah Celestin, to have access to her medical records/information. Entered by Fleet Contras March 24,2011 @ 2:47 pm   Dailly Caffeine Use    Outpatient Prescriptions Prior to Visit  Medication Sig Dispense Refill  . albuterol (PROAIR HFA) 108 (90 BASE) MCG/ACT inhaler Inhale 2 puffs into the lungs every 4 (four) hours as needed for wheezing or shortness of breath. 3 Inhaler 2  . cholestyramine (QUESTRAN) 4 g packet MIX 1 PACKET (4 GRAM TOTAL) IN LIQUID AND DRINK DAILY AS NEEDED FOR DIARRHEA 180 each 0  . clonazePAM (KLONOPIN) 1 MG tablet Take 1 tablet (1 mg total) by mouth 2 (two) times daily as needed for anxiety. (Patient taking differently: Take 0.5-1 mg by mouth 2 (two) times daily as needed for anxiety. Can take 0.5 tablet during the day if needed. She rarely takes it per daughter) 15 tablet 0  . clopidogrel (PLAVIX) 75 MG tablet TAKE  1 TABLET DAILY 90 tablet 2  . desvenlafaxine (PRISTIQ) 100 MG 24 hr tablet Take 100 mg by mouth daily.    . fluticasone (FLONASE) 50 MCG/ACT nasal spray USE 2 SPRAYS IN EACH NOSTRIL DAILY AS NEEDED FOR ALLERGIES 48 g 2  . formoterol (PERFOROMIST) 20 MCG/2ML nebulizer solution Take 2 mLs (20 mcg total) by nebulization 2 (two) times daily. (Patient taking differently: Take 20 mcg by nebulization 2 (two) times daily as needed. For sob & wheezing) 120 mL 5  . glucose blood (BAYER CONTOUR NEXT TEST) test strip Use as directed once daily to check blood sugar.  DX E11.9 100 each 12  . Magnesium 400 MG TABS Take 400 mg by mouth 2 (two) times daily. 180 tablet 0  . montelukast (SINGULAIR) 10 MG tablet Take 10 mg by mouth at bedtime.    . Multiple Vitamins-Minerals (OCUVITE PRESERVISION) TABS Take by mouth 2 (two) times daily.    . Nutritional Supplements (CARNATION INSTANT BREAKFAST PO) Take 1 each by mouth 3 (three) times daily.    . ondansetron (ZOFRAN) 4 MG tablet Take 1 tablet (4 mg total) by mouth every 6 (six) hours as needed for nausea or vomiting. 60 tablet 0  . pantoprazole (PROTONIX) 40 MG tablet TAKE 1 TABLET EVERY MORNING 90 tablet 2  . phenytoin (DILANTIN) 100 MG ER capsule Take 2 capsules (200 mg total) by mouth at bedtime. Reported on 02/13/2016 60 capsule 2  . Polyethyl Glycol-Propyl Glycol (SYSTANE) 0.4-0.3 % SOLN Place 1 drop into both eyes 2 (two) times daily as needed (dry eyes).     . saccharomyces boulardii (FLORASTOR) 250 MG capsule Take 250 mg by mouth daily.    . sodium chloride (OCEAN) 0.65 % SOLN nasal spray Place 2 sprays into both nostrils daily as needed for congestion.     . traMADol (ULTRAM) 50 MG tablet Take 1 tablet (50 mg total) by mouth every 12 (twelve) hours as needed for severe pain. 15 tablet 0  . lactulose (CHRONULAC) 10 GM/15ML solution Take 30 mLs (20 g total) by mouth 2 (two) times daily. 240 mL 0  . potassium chloride (K-DUR) 10 MEQ tablet Take 10 mEq by mouth 2  (two) times daily.    . rifaximin (XIFAXAN) 550 MG TABS tablet Take 1 tablet (550 mg total) by mouth 2 (two) times daily. 42 tablet 0   No facility-administered medications prior to visit.    Allergies  Allergen Reactions  . Bactrim [Sulfamethoxazole-Trimethoprim] Shortness Of Breath  . Levofloxacin Other (See Comments)    seizure  . Pneumovax [Pneumococcal Polysaccharide Vaccine] Other (See Comments)    Severe allergy-cellulitis  . Amoxicillin-Pot Clavulanate Other (See Comments)    Patient stated Drs. recommend taking Augmentin due to her PCN allergy.  . Penicillins Rash    Has patient had a PCN reaction causing immediate rash, facial/tongue/throat swelling, SOB or lightheadedness with hypotension: No Has patient had a PCN reaction causing severe rash involving mucus membranes or skin necrosis: No Has patient had a PCN reaction that required hospitalization No Has patient had a PCN reaction occurring within the last 10 years: No If all of the above answers are "NO", then may proceed with Cephalosporin use.   . Bupropion   . Haldol [Haloperidol Lactate]     Had unknown reaction many yrs ago per pt  . Haloperidol   . Morphine   . Morphine And Related     Confused and agitation  . Pneumococcal Vaccine   . Pseudoephedrine Hcl   . Sudafed [Pseudoephedrine Hcl] Other (See Comments)    seizure    Review of Systems  Constitutional: Positive for malaise/fatigue. Negative for fever.  HENT: Negative for congestion.   Eyes: Negative for blurred vision.  Respiratory: Negative for shortness of breath.   Cardiovascular: Negative for chest pain, palpitations and leg swelling.  Gastrointestinal: Negative for nausea, abdominal pain and blood in stool.  Genitourinary: Negative for dysuria and frequency.  Musculoskeletal: Negative for falls.  Skin: Negative for rash.  Neurological: Positive for dizziness and weakness. Negative for loss of consciousness and headaches.  Endo/Heme/Allergies:  Negative for environmental allergies.  Psychiatric/Behavioral: Negative for depression. The patient is nervous/anxious.        Objective:    Physical Exam  Constitutional: She is oriented to person, place, and time. She appears well-developed and well-nourished. No distress.  HENT:  Head: Normocephalic and atraumatic.  Nose: Nose normal.  Eyes: Right eye exhibits no discharge. Left eye exhibits no discharge.  Neck: Normal range of motion. Neck supple.  Cardiovascular: Normal rate and regular rhythm.   Murmur heard. Pulmonary/Chest: Effort normal and breath sounds normal.  Abdominal: Soft. Bowel sounds are normal. There is no tenderness.  Musculoskeletal: She exhibits no edema.  Neurological: She is alert and oriented to person, place, and time. Coordination abnormal.  Skin: Skin is warm and dry.  Psychiatric: She has a normal mood and affect.  Nursing note and vitals reviewed.   BP 114/60 mmHg  Pulse 97  Temp(Src) 98.2 F (36.8 C) (Oral)  Ht '5\' 4"'$  (1.626 m)  Wt 118 lb 2 oz (53.581 kg)  BMI 20.27 kg/m2  SpO2 98% Wt Readings from Last 3 Encounters:  03/03/16 118 lb 2 oz (53.581 kg)  03/02/16 116 lb (52.617 kg)  02/13/16 119 lb 3.2 oz (54.069 kg)     Lab Results  Component Value Date   WBC 6.6 03/05/2016   HGB 10.4* 03/05/2016   HCT 30.8* 03/05/2016   PLT 156.0 03/05/2016   GLUCOSE 290* 03/05/2016   CHOL 129 03/05/2016   TRIG 136.0 03/05/2016   HDL 33.10* 03/05/2016   LDLDIRECT 124.3 03/20/2010   LDLCALC 68 03/05/2016   ALT 24 03/05/2016   AST 54* 03/05/2016   NA 137 03/05/2016   K 4.6 03/05/2016   CL 104 03/05/2016   CREATININE 0.90 03/05/2016   BUN 19 03/05/2016   CO2 27 03/05/2016   TSH 1.36 03/05/2016  INR 1.3* 03/05/2016   HGBA1C 5.1 03/05/2016    Lab Results  Component Value Date   TSH 1.36 03/05/2016   Lab Results  Component Value Date   WBC 6.6 03/05/2016   HGB 10.4* 03/05/2016   HCT 30.8* 03/05/2016   MCV 91.9 03/05/2016   PLT 156.0  03/05/2016   Lab Results  Component Value Date   NA 137 03/05/2016   K 4.6 03/05/2016   CHLORIDE 108 02/04/2016   CO2 27 03/05/2016   GLUCOSE 290* 03/05/2016   BUN 19 03/05/2016   CREATININE 0.90 03/05/2016   BILITOT 0.4 03/05/2016   ALKPHOS 106 03/05/2016   AST 54* 03/05/2016   ALT 24 03/05/2016   PROT 6.7 03/05/2016   ALBUMIN 3.3* 03/05/2016   CALCIUM 9.0 03/05/2016   ANIONGAP 6 02/29/2016   EGFR 44* 02/04/2016   GFR 63.32 03/05/2016   Lab Results  Component Value Date   CHOL 129 03/05/2016   Lab Results  Component Value Date   HDL 33.10* 03/05/2016   Lab Results  Component Value Date   LDLCALC 68 03/05/2016   Lab Results  Component Value Date   TRIG 136.0 03/05/2016   Lab Results  Component Value Date   CHOLHDL 4 03/05/2016   Lab Results  Component Value Date   HGBA1C 5.1 03/05/2016       Assessment & Plan:   Problem List Items Addressed This Visit    Type 2 diabetes mellitus with vascular disease ([place])    Increase leafy greens, consider increased lean red meat and using cast iron cookware. Continue to monitor, report any concerns      GERD    Avoid offending foods, start probiotics. Do not eat large meals in late evening and consider raising head of bed.       Encephalopathy, hepatic (HCC)    Mental status changes were found to be the cause of hepatic encephalopathy. She is doing somewhat better but is now following closely with astroenterology and is working with PT/OT/speech therapy at [facility] and slowly improving.       Elevated BP    Well controlled. Encouraged heart healthy diet such as the DASH diet and exercise as tolerated.       Diabetes mellitus, type 2 (HCC)    hgba1c acceptable, minimize simple carbs. Increase exercise as tolerated. Continue current meds      Relevant Medications   potassium chloride (K-DUR) 10 MEQ tablet   Other Relevant Orders   Comp Met (CMET)    Other Visit Diagnoses    Encephalopathy    -  Primary     Relevant Medications    rifaximin (XIFAXAN) 200 MG tablet    potassium chloride (K-DUR) 10 MEQ tablet    Other Relevant Orders    Comp Met (CMET)       I have discontinued Ms. Lamar's lactulose. I have also changed her potassium chloride. Additionally, I am having her start on rifaximin. Lastly, I am having her maintain her OCUVITE PRESERVISION, saccharomyces boulardii, Polyethyl Glycol-Propyl Glycol, sodium chloride, albuterol, formoterol, montelukast, desvenlafaxine, ondansetron, fluticasone, clopidogrel, cholestyramine, clonazePAM, traMADol, phenytoin, glucose blood, pantoprazole, Nutritional Supplements (CARNATION INSTANT BREAKFAST PO), and Magnesium.  Meds ordered this encounter  Medications  . rifaximin (XIFAXAN) 200 MG tablet    Sig: Take 1 tablet (200 mg total) by mouth 3 (three) times daily.    Dispense:  42 tablet    Refill:  0    Allergic to Bactrim, Levaquin, PCN, Augmentin  . DISCONTD:  lactulose (CHRONULAC) 10 GM/15ML solution    Sig: Take 30 mLs (20 g total) by mouth 3 (three) times daily.    Dispense:  240 mL    Refill:  0  . potassium chloride (K-DUR) 10 MEQ tablet    Sig: Take 1 tablet (10 mEq total) by mouth 2 (two) times daily.    Dispense:  60 tablet    Refill:  3     Penni Homans, MD

## 2016-03-08 NOTE — Assessment & Plan Note (Signed)
Avoid offending foods, start probiotics. Do not eat large meals in late evening and consider raising head of bed.  

## 2016-03-08 NOTE — Assessment & Plan Note (Signed)
Well controlled. Encouraged heart healthy diet such as the DASH diet and exercise as tolerated.  

## 2016-03-08 NOTE — Assessment & Plan Note (Signed)
Increase leafy greens, consider increased lean red meat and using cast iron cookware. Continue to monitor, report any concerns 

## 2016-03-08 NOTE — Assessment & Plan Note (Addendum)
Mental status changes were found to be the cause of hepatic encephalopathy. She is doing somewhat better but is now following closely with astroenterology and is working with PT/OT/speech therapy at [facility] and slowly improving.

## 2016-03-08 NOTE — Assessment & Plan Note (Signed)
hgba1c acceptable, minimize simple carbs. Increase exercise as tolerated. Continue current meds 

## 2016-03-09 ENCOUNTER — Telehealth: Payer: Self-pay | Admitting: Family Medicine

## 2016-03-09 NOTE — Telephone Encounter (Signed)
Caller name: Judson Roch Relationship to patient: [facility] Can be reached: [phone]   Reason for call: [facility] needs order to monitor Mag level. States her magnesium is low per last hospital stay. Wants to get level weekly for a few weeks. Was hospitalizes 5/25-5/29 with elevated [place] and hypo-magnesium.

## 2016-03-09 NOTE — Telephone Encounter (Signed)
HHRN informed of verbal ok. 

## 2016-03-09 NOTE — Telephone Encounter (Signed)
OK to give order to check Magnesium level weekly x 4 weeks

## 2016-03-10 ENCOUNTER — Encounter: Payer: Self-pay | Admitting: Gastroenterology

## 2016-03-10 ENCOUNTER — Other Ambulatory Visit: Payer: Self-pay | Admitting: Family Medicine

## 2016-03-10 DIAGNOSIS — K7460 Unspecified cirrhosis of liver: Secondary | ICD-10-CM | POA: Insufficient documentation

## 2016-03-10 NOTE — Progress Notes (Signed)
     03/05/2016 Monique Fleming [id number] [birth year]   History of Present Illness:  This is an 82 year old female with multiple medical problems.  Known to Dr. Henrene Pastor for cirrhosis of unknown cause, GERD, and diarrhea thought to be IBS related.  Last seen by Dr. Henrene Pastor 06/2015.  Is accompanied by her daughter today for hospital follow-up.  Was hospitalized twice recently for encephalopathy, determined to be HE.  Was placed on xifaxan 550 mg BID but is very expensive.  Also taking lactulose 30 mL 3 times daily. Her daughter says that some days she does not have bowel movements at all with the lactulose. She says that she feels that her mother is probably about back to 80% of her baseline, still having some mild confusion at times.  Her ammonia level was as high as 144. Had recent ultrasound performed, which showed the following:  IMPRESSION: 1. Cirrhotic changes in the liver. 2. Numerous echogenic foci throughout the liver, consistent with granulomata or fibrosis. 3. Splenomegaly consistent with portal venous hypertension. 4. Status post cholecystectomy. 5. Bilateral renal cysts with benign features. 6. Small amount of ascites.   Current Medications, Allergies, Past Medical History, Past Surgical History, Family History and Social History were reviewed in [employer] record.  Physical Exam: BP 106/60 mmHg  Pulse 84 General: Well developed female in no acute distress Head: Normocephalic and atraumatic Eyes:  Sclerae anicteric, conjunctiva pink  Ears: Normal auditory acuity Lungs: Clear throughout to auscultation Heart: Regular rate and rhythm Abdomen: Soft, non-distended.  Normal bowel.  Non-tender. Musculoskeletal: Symmetrical with no gross deformities  Extremities: No edema  Neurological: Alert oriented x 4, grossly non-focal Psychological:  Alert and cooperative. Normal mood and affect  Assessment and Recommendations: -Cirrhosis complicated by hepatic  encephalopathy and evidence of portal hypertension with splenomegaly and mild portal hypertensive gastropathy on EGD 11/2011 (but no varices at that time):  Recently hospitalized for HE.  Started on Xifaxan 550 mg BID and lactulose 30 mL TID. -History of colon cancer status post resection. Last colonoscopy January 2014 without neoplasia status post right hemicolectomy.  *Will check labs today to include PT/INR, ammonia level (at family's request), and AFP. *Will try to get her Xifaxan 550 mg BID through [facility]. *We will increase her lactulose to 45 mL 3 times a day and I have discussed with her daughter titration to 3-4 soft bowel movements daily. *Follow-up with Dr. Henrene Pastor in approximately 6 weeks.  **Also of note, the patient's daughter is concerned about her low magnesium levels and potential with PPI interfering with her magnesium absorption. I discussed with her that we could possibly see about discontinuing her pantoprazole and starting Zantac 300 mg daily for her reflux instead for now to see how her magnesium levels do with that.

## 2016-03-10 NOTE — Telephone Encounter (Signed)
Please confirm patient is requesting this refill and why we do not usually refill this

## 2016-03-11 ENCOUNTER — Encounter: Payer: Self-pay | Admitting: Gastroenterology

## 2016-03-11 ENCOUNTER — Encounter: Payer: Self-pay | Admitting: Family Medicine

## 2016-03-11 ENCOUNTER — Telehealth: Payer: Self-pay | Admitting: *Deleted

## 2016-03-11 NOTE — Progress Notes (Signed)
Complicated patient well known to me. Reviewed. Agree with initial plans as outlined

## 2016-03-11 NOTE — Telephone Encounter (Signed)
LM for the patient's daughter Monique Fleming advising I have been able to fax the insurance cards and office note to [facility] today.  Waiting for an answer from them about getting the Xifaxan 550 mg.

## 2016-03-11 NOTE — Telephone Encounter (Signed)
Left detailed message on pt's home # to call to verify request and let her know we do not usually prescribed this medication.

## 2016-03-12 ENCOUNTER — Other Ambulatory Visit: Payer: Medicare Other

## 2016-03-12 NOTE — Telephone Encounter (Signed)
Spoke to the daughter and she stated GI is working on the [place] for this medication and they will take care of.

## 2016-03-15 ENCOUNTER — Other Ambulatory Visit: Payer: Self-pay | Admitting: Family Medicine

## 2016-03-16 ENCOUNTER — Telehealth: Payer: Self-pay | Admitting: Family Medicine

## 2016-03-16 ENCOUNTER — Telehealth: Payer: Self-pay | Admitting: Gastroenterology

## 2016-03-16 ENCOUNTER — Telehealth: Payer: Self-pay | Admitting: *Deleted

## 2016-03-16 ENCOUNTER — Telehealth: Payer: Self-pay | Admitting: Internal Medicine

## 2016-03-16 ENCOUNTER — Encounter: Payer: Self-pay | Admitting: Family Medicine

## 2016-03-16 NOTE — Telephone Encounter (Signed)
Caller name: Judson Roch  Relation to pt: RN from [employer] back number: [phone]    Reason for call:  Requesting verbal orders for ammonia blood work due to patient being confused.

## 2016-03-16 NOTE — Telephone Encounter (Signed)
Pts daughter states pt has been getting more confused, states it started on Saturday. Pt is taking lactulose 45ml tid and is having 3-4 stools daily. Have been unable to get xifaxan yet, under appeal with insurance company. Pts daughter wants to know if they should increase lactulose. Please advise.

## 2016-03-16 NOTE — Telephone Encounter (Signed)
Caller name: Elmyra Ricks Relationship to patient: [facility] Can be reached: [phone]  Reason for call: Need office notes  from 03/03/16 faxed to [phone]

## 2016-03-16 NOTE — Telephone Encounter (Signed)
See note from 03-17-2016.

## 2016-03-16 NOTE — Telephone Encounter (Signed)
Faxed as requested office notes dated 03/03/2016 to fax number below.

## 2016-03-16 NOTE — Telephone Encounter (Signed)
Ok to give verbal order to check ammonia level for confusion.

## 2016-03-16 NOTE — Telephone Encounter (Signed)
Spoke with pts daughter and she is aware. Waiting on insurance approval for the xifaxan.

## 2016-03-16 NOTE — Telephone Encounter (Signed)
Can increase a little bit and aim. for 5 loose bowels per day. Xifaxan would be potentially very helpful in this patient. Please start Xifaxan ASAP (550 mg twice daily)

## 2016-03-16 NOTE — Telephone Encounter (Signed)
Called Julene at [employer], She is working to get this appeal started. Apparently , 3 months ago someone had prescribed Xifaxan 200 mg with a different diagnosis code. That is why they denied our prescription for 550 mg, Diagnosis code Hepatic encephalopathy. She will fax me a form once she has it regarding the appeal.  Called the patient's daughter to advise I will ask Janett Billow  If she wants to prescribe something different for now. Julene told me the appeals can sometime s take several months.

## 2016-03-17 ENCOUNTER — Encounter: Payer: Self-pay | Admitting: Family Medicine

## 2016-03-17 ENCOUNTER — Ambulatory Visit (INDEPENDENT_AMBULATORY_CARE_PROVIDER_SITE_OTHER): Payer: Medicare Other | Admitting: Family Medicine

## 2016-03-17 VITALS — BP 140/68 | HR 93 | Temp 97.7°F | Ht 64.0 in | Wt 124.2 lb

## 2016-03-17 DIAGNOSIS — IMO0001 Reserved for inherently not codable concepts without codable children: Secondary | ICD-10-CM

## 2016-03-17 DIAGNOSIS — I639 Cerebral infarction, unspecified: Secondary | ICD-10-CM | POA: Diagnosis not present

## 2016-03-17 DIAGNOSIS — M4800 Spinal stenosis, site unspecified: Secondary | ICD-10-CM | POA: Diagnosis not present

## 2016-03-17 DIAGNOSIS — R030 Elevated blood-pressure reading, without diagnosis of hypertension: Secondary | ICD-10-CM | POA: Diagnosis not present

## 2016-03-17 DIAGNOSIS — E1159 Type 2 diabetes mellitus with other circulatory complications: Secondary | ICD-10-CM

## 2016-03-17 DIAGNOSIS — R2681 Unsteadiness on feet: Secondary | ICD-10-CM

## 2016-03-17 NOTE — Progress Notes (Signed)
Pre visit review using our clinic review tool, if applicable. No additional management support is needed unless otherwise documented below in the visit note. 

## 2016-03-17 NOTE — Patient Instructions (Signed)

## 2016-03-17 NOTE — Telephone Encounter (Signed)
Called [facility] at [phone] (which is correct [facility] phone number) informed of PCP ok

## 2016-03-18 ENCOUNTER — Encounter: Payer: Self-pay | Admitting: Family

## 2016-03-19 ENCOUNTER — Ambulatory Visit (INDEPENDENT_AMBULATORY_CARE_PROVIDER_SITE_OTHER): Payer: Medicare Other | Admitting: Adult Health

## 2016-03-19 ENCOUNTER — Encounter: Payer: Self-pay | Admitting: Adult Health

## 2016-03-19 ENCOUNTER — Encounter: Payer: Self-pay | Admitting: Family

## 2016-03-19 ENCOUNTER — Encounter: Payer: Self-pay | Admitting: Family Medicine

## 2016-03-19 ENCOUNTER — Ambulatory Visit: Payer: Medicare Other

## 2016-03-19 ENCOUNTER — Other Ambulatory Visit: Payer: Self-pay

## 2016-03-19 ENCOUNTER — Other Ambulatory Visit (HOSPITAL_BASED_OUTPATIENT_CLINIC_OR_DEPARTMENT_OTHER): Payer: Medicare Other

## 2016-03-19 ENCOUNTER — Ambulatory Visit (HOSPITAL_COMMUNITY)
Admission: RE | Admit: 2016-03-19 | Discharge: 2016-03-19 | Disposition: A | Discharge status: Home / Self Care | Payer: Medicare Other | Source: Ambulatory Visit | Attending: Hematology & Oncology | Admitting: Hematology & Oncology

## 2016-03-19 ENCOUNTER — Ambulatory Visit (HOSPITAL_BASED_OUTPATIENT_CLINIC_OR_DEPARTMENT_OTHER): Payer: Medicare Other | Admitting: Family

## 2016-03-19 ENCOUNTER — Telehealth: Payer: Self-pay | Admitting: Internal Medicine

## 2016-03-19 VITALS — BP 134/58 | HR 78 | Temp 97.9°F | Ht 65.0 in | Wt 122.0 lb

## 2016-03-19 VITALS — BP 120/38 | HR 72 | Temp 98.1°F | Resp 16 | Ht 65.0 in | Wt 122.0 lb

## 2016-03-19 DIAGNOSIS — D500 Iron deficiency anemia secondary to blood loss (chronic): Secondary | ICD-10-CM

## 2016-03-19 DIAGNOSIS — I639 Cerebral infarction, unspecified: Secondary | ICD-10-CM | POA: Diagnosis not present

## 2016-03-19 DIAGNOSIS — D509 Iron deficiency anemia, unspecified: Secondary | ICD-10-CM

## 2016-03-19 DIAGNOSIS — J449 Chronic obstructive pulmonary disease, unspecified: Secondary | ICD-10-CM

## 2016-03-19 DIAGNOSIS — Z85038 Personal history of other malignant neoplasm of large intestine: Secondary | ICD-10-CM

## 2016-03-19 DIAGNOSIS — K641 Second degree hemorrhoids: Secondary | ICD-10-CM

## 2016-03-19 LAB — CBC WITH DIFFERENTIAL (CANCER CENTER ONLY)
BASO#: 0 10*3/uL (ref 0.0–0.2)
BASO%: 0.8 % (ref 0.0–2.0)
EOS ABS: 0.2 10*3/uL (ref 0.0–0.5)
EOS%: 3.9 % (ref 0.0–7.0)
HCT: 29.4 % — ABNORMAL LOW (ref 34.8–46.6)
HGB: 9.5 g/dL — ABNORMAL LOW (ref 11.6–15.9)
LYMPH#: 0.7 10*3/uL — ABNORMAL LOW (ref 0.9–3.3)
LYMPH%: 13.9 % — AB (ref 14.0–48.0)
MCH: 30.8 pg (ref 26.0–34.0)
MCHC: 32.3 g/dL (ref 32.0–36.0)
MCV: 96 fL (ref 81–101)
MONO#: 0.4 10*3/uL (ref 0.1–0.9)
MONO%: 9.1 % (ref 0.0–13.0)
NEUT#: 3.5 10*3/uL (ref 1.5–6.5)
NEUT%: 72.3 % (ref 39.6–80.0)
PLATELETS: 103 10*3/uL — AB (ref 145–400)
RBC: 3.08 10*6/uL — ABNORMAL LOW (ref 3.70–5.32)
RDW: 14.9 % (ref 11.1–15.7)
WBC: 4.8 10*3/uL (ref 3.9–10.0)

## 2016-03-19 LAB — COMPREHENSIVE METABOLIC PANEL
ALT: 22 U/L (ref 0–55)
ANION GAP: 8 meq/L (ref 3–11)
AST: 46 U/L — ABNORMAL HIGH (ref 5–34)
Albumin: 2.8 g/dL — ABNORMAL LOW (ref 3.5–5.0)
Alkaline Phosphatase: 102 U/L (ref 40–150)
BUN: 12.1 mg/dL (ref 7.0–26.0)
CHLORIDE: 106 meq/L (ref 98–109)
CO2: 24 meq/L (ref 22–29)
Calcium: 8.7 mg/dL (ref 8.4–10.4)
Creatinine: 0.9 mg/dL (ref 0.6–1.1)
EGFR: 58 mL/min/{1.73_m2} — AB (ref >90)
Glucose: 310 mg/dl — ABNORMAL HIGH (ref 70–140)
POTASSIUM: 4.6 meq/L (ref 3.5–5.1)
Sodium: 137 mEq/L (ref 136–145)
Total Bilirubin: 0.45 mg/dL (ref 0.20–1.20)
Total Protein: 6.2 g/dL — ABNORMAL LOW (ref 6.4–8.3)

## 2016-03-19 LAB — LACTATE DEHYDROGENASE: LDH: 208 U/L (ref 125–245)

## 2016-03-19 LAB — FERRITIN: FERRITIN: 69 ng/mL (ref 9–269)

## 2016-03-19 LAB — IRON AND TIBC
%SAT: 16 % — AB (ref 21–57)
Iron: 37 ug/dL — ABNORMAL LOW (ref 41–142)
TIBC: 230 ug/dL — AB (ref 236–444)
UIBC: 193 ug/dL (ref 120–384)

## 2016-03-19 MED ORDER — AZITHROMYCIN 250 MG PO TABS: ORAL_TABLET | ORAL | Status: AC

## 2016-03-19 NOTE — Telephone Encounter (Signed)
Pt receiving 2 units of blood at cancer center in [place] tomorrow. NP requests pt be seen by GI, family would like pt to be seen by Dr. Henrene Pastor. Please advise if pt should be added on to your schedule.

## 2016-03-19 NOTE — Addendum Note (Signed)
Addended by: Melvenia Needles on: 03/19/2016 10:00 AM   Modules accepted: Orders

## 2016-03-19 NOTE — Progress Notes (Signed)
Hematology and Oncology Follow Up Visit  Monique Fleming [id number] 11/28/1928 82 y.o. 03/19/2016   Principle Diagnosis:  1. Stage I (T1 N0 M0) adenocarcinoma of the right colon 2. Intermittent iron deficiency anemia secondary to chronic blood loss  Current Therapy:   IV iron as indicated     Interim History: Monique Fleming is here today with her granddaughter for a follow-up. She is feeling fatigued and having dizziness. Thankfully she has not fallen or experienced any syncope. She has also had some SOB with exertion. She uses supplemental O2 at night.  Her Hgb today is down to 9.5. I did a rectal exam and her stool was positive for blood.  She is currently on a z-pack for a respiratory infection. She has a dry cough at this time.  She is taking lactulose daily to help reduce her ammonia level.  No fever, chills, n/v, rash, headache, chest pain, abdominal pain or changes in bladder habits.  No episodes of bleeding or bruising. No lymphadenopathy found on exam.  No swelling, tenderness, numbness or tingling in her extremities. No c/o joint aches or pain.  Her appetite is decreased but she states that she is making sure she eats regularly. She is staying hydrated. Her weight is stable.   Medications:    Medication List       This list is accurate as of: 03/19/16 11:42 AM.  Always use your most recent med list.               albuterol 108 (90 Base) MCG/ACT inhaler  Commonly known as:  PROAIR HFA  Inhale 2 puffs into the lungs every 4 (four) hours as needed for wheezing or shortness of breath.     azithromycin 250 MG tablet  Commonly known as:  ZITHROMAX Z-PAK  Take 2 tablets (500 mg) on  Day 1,  followed by 1 tablet (250 mg) once daily on Days 2 through 5.     CARNATION INSTANT BREAKFAST PO  Take 1 each by mouth 3 (three) times daily.     cholestyramine 4 g packet  Commonly known as:  QUESTRAN  MIX 1 PACKET (4 GRAM TOTAL) IN LIQUID AND DRINK DAILY AS NEEDED FOR DIARRHEA     clonazePAM 1 MG tablet  Commonly known as:  KLONOPIN  Take 1 tablet (1 mg total) by mouth 2 (two) times daily as needed for anxiety.     clopidogrel 75 MG tablet  Commonly known as:  PLAVIX  TAKE 1 TABLET DAILY     desvenlafaxine 100 MG 24 hr tablet  Commonly known as:  PRISTIQ  Take 100 mg by mouth daily.     fluticasone 50 MCG/ACT nasal spray  Commonly known as:  FLONASE  USE 2 SPRAYS IN EACH NOSTRIL DAILY AS NEEDED FOR ALLERGIES     formoterol 20 MCG/2ML nebulizer solution  Commonly known as:  PERFOROMIST  Take 2 mLs (20 mcg total) by nebulization 2 (two) times daily.     glucose blood test strip  Commonly known as:  BAYER CONTOUR NEXT TEST  Use as directed once daily to check blood sugar.  DX E11.9     lactulose 10 GM/15ML solution  Commonly known as:  CHRONULAC  Take 45 ml's 3 times daily.     Magnesium 400 MG Tabs  Take 400 mg by mouth 2 (two) times daily.     metFORMIN 500 MG tablet  Commonly known as:  GLUCOPHAGE  TAKE ONE-HALF (1/2) TO ONE TABLET DAILY WITH  BREAKFAST     montelukast 10 MG tablet  Commonly known as:  SINGULAIR  Take 10 mg by mouth at bedtime.     OCUVITE PRESERVISION Tabs  Take by mouth 2 (two) times daily.     ondansetron 4 MG tablet  Commonly known as:  ZOFRAN  Take 1 tablet (4 mg total) by mouth every 6 (six) hours as needed for nausea or vomiting.     pantoprazole 40 MG tablet  Commonly known as:  PROTONIX  TAKE 1 TABLET EVERY MORNING     phenytoin 100 MG ER capsule  Commonly known as:  DILANTIN  Take 2 capsules (200 mg total) by mouth at bedtime. Reported on 02/13/2016     potassium chloride 10 MEQ tablet  Commonly known as:  K-DUR  Take 1 tablet (10 mEq total) by mouth 2 (two) times daily.     ranitidine 300 MG tablet  Commonly known as:  ZANTAC  Take 1 tablet daily.     rifaximin 200 MG tablet  Commonly known as:  XIFAXAN  Take 1 tablet (200 mg total) by mouth 3 (three) times daily.     saccharomyces boulardii 250 MG  capsule  Commonly known as:  FLORASTOR  Take 250 mg by mouth daily.     sodium chloride 0.65 % Soln nasal spray  Commonly known as:  OCEAN  Place 2 sprays into both nostrils daily as needed for congestion.     SYSTANE 0.4-0.3 % Soln  Generic drug:  Polyethyl Glycol-Propyl Glycol  Place 1 drop into both eyes 2 (two) times daily as needed (dry eyes).     traMADol 50 MG tablet  Commonly known as:  ULTRAM  Take 1 tablet (50 mg total) by mouth every 12 (twelve) hours as needed for severe pain.        Allergies:  Allergies  Allergen Reactions  . Bactrim [Sulfamethoxazole-Trimethoprim] Shortness Of Breath  . Levofloxacin Other (See Comments)    seizure  . Pneumovax [Pneumococcal Polysaccharide Vaccine] Other (See Comments)    Severe allergy-cellulitis  . Amoxicillin-Pot Clavulanate Other (See Comments)    Patient stated Drs. recommend taking Augmentin due to her PCN allergy.  . Penicillins Rash    Has patient had a PCN reaction causing immediate rash, facial/tongue/throat swelling, SOB or lightheadedness with hypotension: No Has patient had a PCN reaction causing severe rash involving mucus membranes or skin necrosis: No Has patient had a PCN reaction that required hospitalization No Has patient had a PCN reaction occurring within the last 10 years: No If all of the above answers are "NO", then may proceed with Cephalosporin use.   . Bupropion   . Haldol [Haloperidol Lactate]     Had unknown reaction many yrs ago per pt  . Haloperidol   . Morphine   . Morphine And Related     Confused and agitation  . Pneumococcal Vaccine   . Pseudoephedrine Hcl   . Sudafed [Pseudoephedrine Hcl] Other (See Comments)    seizure    Past Medical History, Surgical history, Social history, and Family History were reviewed and updated.  Review of Systems: All other 10 point review of systems is negative.   Physical Exam:  height is 5\' 5"  (1.651 m) and weight is 122 lb (55.339 kg). Her oral  temperature is 98.1 F (36.7 C). Her blood pressure is 120/38 and her pulse is 72. Her respiration is 16.   Wt Readings from Last 3 Encounters:  03/19/16 122 lb (55.339 kg)  03/19/16 122 lb (55.339 kg)  03/17/16 124 lb 4 oz (56.359 kg)    Ocular: Sclerae unicteric, pupils equal, round and reactive to light Ear-nose-throat: Oropharynx clear, dentition fair Lymphatic: No cervical supraclavicular or axillary adenopathy Lungs no rales or rhonchi, good excursion bilaterally Heart regular rate and rhythm, no murmur appreciated Abd soft, nontender, positive bowel sounds, newly liver or spleen tip palpated on exam, no fluid wave MSK no focal spinal tenderness, no joint edema Neuro: non-focal, well-oriented, appropriate affect Breasts: Deferred  Lab Results  Component Value Date   WBC 4.8 03/19/2016   HGB 9.5* 03/19/2016   HCT 29.4* 03/19/2016   MCV 96 03/19/2016   PLT 103* 03/19/2016   Lab Results  Component Value Date   FERRITIN 367* 02/04/2016   IRON 73 02/04/2016   TIBC 205* 02/04/2016   UIBC 132 02/04/2016   IRONPCTSAT 35 02/04/2016   Lab Results  Component Value Date   RETICCTPCT 1.4 07/25/2015   RBC 3.08* 03/19/2016   RETICCTABS 55.3 07/25/2015   No results found for: Nils Pyle [facility] Lab Results  Component Value Date   IGA 507* 08/23/2014   No results found for: Odetta Pink, SPEI   Chemistry      Component Value Date/Time   NA 137 03/05/2016 1223   NA 143 02/04/2016 1114   NA 145 01/27/2016   NA 134 09/10/2015 1054   K 4.6 03/05/2016 1223   K 4.3 02/04/2016 1114   K 3.1* 09/10/2015 1054   CL 104 03/05/2016 1223   CL 100 09/10/2015 1054   CO2 27 03/05/2016 1223   CO2 26 02/04/2016 1114   CO2 26 09/10/2015 1054   BUN 19 03/05/2016 1223   BUN 16.4 02/04/2016 1114   BUN 10 01/27/2016   BUN 7 09/10/2015 1054   CREATININE 0.90 03/05/2016 1223   CREATININE 1.2* 02/04/2016 1114    CREATININE 0.8 01/27/2016   CREATININE 0.7 09/10/2015 1054   GLU 88 01/27/2016      Component Value Date/Time   CALCIUM 9.0 03/05/2016 1223   CALCIUM 9.3 02/04/2016 1114   CALCIUM 8.2 09/10/2015 1054   ALKPHOS 106 03/05/2016 1223   ALKPHOS 101 02/04/2016 1114   ALKPHOS 100* 09/10/2015 1054   AST 54* 03/05/2016 1223   AST 55* 02/04/2016 1114   AST 34 09/10/2015 1054   ALT 24 03/05/2016 1223   ALT 33 02/04/2016 1114   ALT 13 09/10/2015 1054   BILITOT 0.4 03/05/2016 1223   BILITOT 0.50 02/04/2016 1114   BILITOT 0.80 09/10/2015 1054     Impression and Plan: Monique Fleming is a 82 year old female with a history of stage I adenocarcinoma of the right colon with resection in May 2008. She is symptomatic at this time with fatigue, dizziness and SOB. She has had a drop in her Hgb to 9.5 and her stool was positive for blood.    She will get 2 units PRBC's here in the office tomorrow.  Dr. Henrene Pastor is out of town at this time but I will forward my note to him as well as his PA for follow-up and possible treatment.  We will plan to see her back in 3 weeks for repeat lab work and follow-up.  Both she and her family know to contact our office with any questions or concerns. We can certainly see her sooner if need be.   Eliezer Bottom, NP 6/15/201711:42 AM

## 2016-03-19 NOTE — Progress Notes (Signed)
   Subjective:    Patient ID: Monique Fleming, female    DOB: 13-Dec-1928, 82 y.o.   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 Followed by hematology received iron infusions   Declines flu shots   TEST  CT angio 09/2013 ne, emphysema 7/2/2015Spiro: Fev1 59% FeV1/FVC 54%  ONO 09/21/2014 RA: sats low on repeated occasions  03/19/2016 Follow up : COPD  Pt returns for 3 month follow up for COPD  Says she is doing well overall until 1 week ago. Cough and congestion have started . Coughing up very thick mucus with occasional wheezing.  Remains on O2 At bedtime  .  Remains on Perforomist Nebs Twice daily.   Denies chest pain, orthopnea, edema or hemoptysis or fever .       Review of Systems Constitutional:   No  weight loss, night sweats,  Fevers, chills,  +fatigue, or  lassitude.  HEENT:   No headaches,  Difficulty swallowing,  Tooth/dental problems, or  Sore throat,                No sneezing, itching, ear ache,  +nasal congestion, post nasal drip,   CV:  No chest pain,  Orthopnea, PND, swelling in lower extremities, anasarca, dizziness, palpitations, syncope.   GI  No heartburn, indigestion, abdominal pain, nausea, vomiting, diarrhea, change in bowel habits, loss of appetite, bloody stools.   Resp:  .    No chest wall deformity  Skin: no rash or lesions.  GU: no dysuria, change in color of urine, no urgency or frequency.  No flank pain, no hematuria   MS:  No joint pain or swelling.  No decreased range of motion.  No back pain.  Psych:  No change in mood or affect. No depression or anxiety.  No memory loss.         Objective:   Physical Exam   Filed Vitals:   03/19/16 0931  BP: 134/58  Pulse: 78  Temp: 97.9 F (36.6 C)  TempSrc: Oral  Height: 5\' 5"   (1.651 m)  Weight: 122 lb (55.339 kg)  SpO2: 95%    GEN: A/Ox3; pleasant , NAD, frail and elderly   HEENT:  [place]/AT,  EACs-clear, TMs-wnl, NOSE-clear, THROAT-clear, no lesions, no postnasal drip or exudate noted.    NECK:  Supple w/ fair ROM; no JVD; normal carotid impulses w/o bruits; no thyromegaly or nodules palpated; no lymphadenopathy.  RESP  Decreased BS in bases ,  w/o, wheezes/ rales/ or rhonchi.no accessory muscle use, no dullness to percussion  CARD:  RRR, no m/r/g  , no peripheral edema, pulses intact, no cyanosis or clubbing.  GI:   Soft & nt; nml bowel sounds; no organomegaly or masses detected.  Musco: Warm bil, no deformities or joint swelling noted.   Neuro: alert, no focal deficits noted.    Skin: Warm, no lesions or rashes   Tammy Parrett NP-C  [facility]  03/19/2016

## 2016-03-19 NOTE — Assessment & Plan Note (Signed)
Mild flare   Plan  Begin Zpack take as directed.  Mucinex DM Twice daily  As needed  Cough/congestion  Continue on Perforomist Neb Twice daily   Follow up Dr. Elsworth Soho  In 4 months and As needed   Please contact office for sooner follow up if symptoms do not improve or worsen or seek emergency care

## 2016-03-19 NOTE — Progress Notes (Signed)
Fax received from [employer]  Ammonia:  149  H       Range:  16-53 umol/L

## 2016-03-19 NOTE — Patient Instructions (Addendum)
Begin Zpack take as directed.  Mucinex DM Twice daily  As needed  Cough/congestion  Continue on Perforomist Neb Twice daily   Follow up Dr. Elsworth Soho  In 4 months and As needed   Please contact office for sooner follow up if symptoms do not improve or worsen or seek emergency care

## 2016-03-20 ENCOUNTER — Telehealth: Payer: Self-pay | Admitting: Family Medicine

## 2016-03-20 ENCOUNTER — Ambulatory Visit (HOSPITAL_BASED_OUTPATIENT_CLINIC_OR_DEPARTMENT_OTHER): Payer: Medicare Other

## 2016-03-20 ENCOUNTER — Telehealth: Payer: Self-pay | Admitting: Family

## 2016-03-20 VITALS — BP 136/46 | HR 69 | Temp 98.3°F | Resp 16

## 2016-03-20 DIAGNOSIS — D500 Iron deficiency anemia secondary to blood loss (chronic): Secondary | ICD-10-CM

## 2016-03-20 DIAGNOSIS — D509 Iron deficiency anemia, unspecified: Secondary | ICD-10-CM | POA: Diagnosis not present

## 2016-03-20 LAB — CBC WITH DIFFERENTIAL (CANCER CENTER ONLY)
BASO#: 0 10*3/uL (ref 0.0–0.2)
BASO%: 0.7 % (ref 0.0–2.0)
EOS ABS: 0.2 10*3/uL (ref 0.0–0.5)
EOS%: 5.1 % (ref 0.0–7.0)
HCT: 33.7 % — ABNORMAL LOW (ref 34.8–46.6)
HGB: 10.8 g/dL — ABNORMAL LOW (ref 11.6–15.9)
LYMPH#: 0.7 10*3/uL — ABNORMAL LOW (ref 0.9–3.3)
LYMPH%: 14.9 % (ref 14.0–48.0)
MCH: 31 pg (ref 26.0–34.0)
MCHC: 32 g/dL (ref 32.0–36.0)
MCV: 97 fL (ref 81–101)
MONO#: 0.4 10*3/uL (ref 0.1–0.9)
MONO%: 9.3 % (ref 0.0–13.0)
NEUT#: 3.2 10*3/uL (ref 1.5–6.5)
NEUT%: 70 % (ref 39.6–80.0)
PLATELETS: 97 10*3/uL — AB (ref 145–400)
RBC: 3.48 10*6/uL — AB (ref 3.70–5.32)
RDW: 14.9 % (ref 11.1–15.7)
WBC: 4.5 10*3/uL (ref 3.9–10.0)

## 2016-03-20 LAB — ERYTHROPOIETIN: Erythropoietin: 19.9 m[IU]/mL — ABNORMAL HIGH (ref 2.6–18.5)

## 2016-03-20 LAB — RETICULOCYTES: Reticulocyte Count: 2.7 % — ABNORMAL HIGH (ref 0.6–2.6)

## 2016-03-20 LAB — PREPARE RBC (CROSSMATCH)

## 2016-03-20 MED ORDER — FUROSEMIDE 10 MG/ML IJ SOLN
INTRAMUSCULAR | Status: AC
  Filled 2016-03-20: qty 4

## 2016-03-20 MED ORDER — ACETAMINOPHEN 325 MG PO TABS
650.0000 mg | ORAL_TABLET | Freq: Once | ORAL | Status: AC
  Administered 2016-03-20: 650 mg via ORAL

## 2016-03-20 MED ORDER — SODIUM CHLORIDE 0.9 % IV SOLN
250.0000 mL | Freq: Once | INTRAVENOUS | Status: AC
  Administered 2016-03-20: 250 mL via INTRAVENOUS

## 2016-03-20 MED ORDER — ACETAMINOPHEN 325 MG PO TABS
ORAL_TABLET | ORAL | Status: AC
  Filled 2016-03-20: qty 1

## 2016-03-20 MED ORDER — FUROSEMIDE 10 MG/ML IJ SOLN
20.0000 mg | Freq: Once | INTRAMUSCULAR | Status: AC
  Administered 2016-03-20: 20 mg via INTRAVENOUS

## 2016-03-20 NOTE — Telephone Encounter (Signed)
I spoke with Ronnald Nian (patient's daughter) and discussed the patients plan of care for her GI bleed. She will follow-up with Dr. Blanch Media office next week once he returns. All questions were answered. She is in agreement with the plan.

## 2016-03-20 NOTE — Telephone Encounter (Signed)
Caller: Hilliard Clark PT with [facility] Phone# [phone]   Pt declined [facility] today. She stated she was too tired after having blood transfusion this morning.

## 2016-03-20 NOTE — Patient Instructions (Signed)

## 2016-03-20 NOTE — Telephone Encounter (Signed)
Caller name: Larrie Kass, ST with [facility] Can be reached: [phone]  Reason for call: Not able to go for visit this morning due to pt having a blood transfusion. Needs call with VO to approve her to go out next week for ST visit instead.

## 2016-03-20 NOTE — Telephone Encounter (Signed)
Message Sent: Hello Sonu, Sniffen had her lab for ammonia levels order and drawn for and by Gastroenterology, therefore; you would need to contact their office regarding the results, as we do not give out results that belong to outside providers. Therefore, we will have to defer this inquiry to Ms. Andree Elk' gastroenterologist. Thanks for choosing ! Best Regards, Massie Kluver. Nicolette Gieske, CMA (AAMA)    ----- Message -----    From: Epping,Mahogany B    Sent: 03/19/2016  3:39 PM EDT      To: Penni Homans, MD Subject: Non-Urgent Medical Question  Please advise on most recent ammonia levels.  Thanks.Marland KitchenMarland KitchenBetsey

## 2016-03-20 NOTE — Telephone Encounter (Signed)
OK to give verbal order for ST eval and treat as requested

## 2016-03-22 LAB — TYPE AND SCREEN
ABO/RH(D): O POS
Antibody Screen: NEGATIVE
UNIT DIVISION: 0
Unit division: 0

## 2016-03-23 ENCOUNTER — Telehealth: Payer: Self-pay | Admitting: Internal Medicine

## 2016-03-23 ENCOUNTER — Encounter: Payer: Self-pay | Admitting: Adult Health

## 2016-03-23 ENCOUNTER — Telehealth: Payer: Self-pay | Admitting: Family Medicine

## 2016-03-23 MED ORDER — MONTELUKAST SODIUM 10 MG PO TABS: 10.0000 mg | ORAL_TABLET | Freq: Every day | ORAL | Status: AC

## 2016-03-23 NOTE — Telephone Encounter (Signed)
Received lab result from [facility]  Ammonia was 149. Called and spoke to the daughter Monique Fleming informed of results and PCP instructions. Stay well hydrated and add a dose of lactulose daily and recheck lab in one week. Daughter did verbalize/understand results/instruction.

## 2016-03-23 NOTE — Telephone Encounter (Signed)
Pt scheduled to see Dr. Henrene Pastor 03/26/16@9 :30am. Pts daughter aware of appt.

## 2016-03-23 NOTE — Telephone Encounter (Signed)
Pt has been scheduled for 03/26/16@9 :30am. Family aware.

## 2016-03-23 NOTE — Telephone Encounter (Signed)
Called [facility] left a detailed message of PCP verbal ok. 

## 2016-03-23 NOTE — Telephone Encounter (Signed)
FYI

## 2016-03-23 NOTE — Telephone Encounter (Signed)
No add on, but next available and watch for cancellations

## 2016-03-25 ENCOUNTER — Encounter: Payer: Self-pay | Admitting: Internal Medicine

## 2016-03-25 ENCOUNTER — Encounter: Payer: Self-pay | Admitting: *Deleted

## 2016-03-26 ENCOUNTER — Ambulatory Visit (INDEPENDENT_AMBULATORY_CARE_PROVIDER_SITE_OTHER): Payer: Medicare Other | Admitting: Internal Medicine

## 2016-03-26 ENCOUNTER — Encounter: Payer: Self-pay | Admitting: Internal Medicine

## 2016-03-26 VITALS — BP 88/50 | HR 68 | Ht 65.0 in | Wt 123.0 lb

## 2016-03-26 DIAGNOSIS — K7290 Hepatic failure, unspecified without coma: Secondary | ICD-10-CM

## 2016-03-26 DIAGNOSIS — I639 Cerebral infarction, unspecified: Secondary | ICD-10-CM

## 2016-03-26 DIAGNOSIS — K766 Portal hypertension: Secondary | ICD-10-CM

## 2016-03-26 DIAGNOSIS — K7460 Unspecified cirrhosis of liver: Secondary | ICD-10-CM | POA: Diagnosis not present

## 2016-03-26 DIAGNOSIS — K3189 Other diseases of stomach and duodenum: Secondary | ICD-10-CM | POA: Diagnosis not present

## 2016-03-26 DIAGNOSIS — D509 Iron deficiency anemia, unspecified: Secondary | ICD-10-CM

## 2016-03-26 DIAGNOSIS — K5520 Angiodysplasia of colon without hemorrhage: Secondary | ICD-10-CM

## 2016-03-26 DIAGNOSIS — K7682 Hepatic encephalopathy: Secondary | ICD-10-CM

## 2016-03-26 NOTE — Patient Instructions (Signed)
Please follow up with Dr. Henrene Pastor on 06/25/2016 at 9:00am

## 2016-03-26 NOTE — Progress Notes (Signed)
HISTORY OF PRESENT ILLNESS:  Monique Fleming is a 82 y.o. female with multiple multiple significant medical problems as listed below. She has been followed in this office for number of GI problems including a history of colon cancer, chronic diarrhea, and deficiency anemia secondary to gastrointestinal AVMs, GERD, functional abdominal complaints consistent with IBS, and hepatic cirrhosis with portal hypertension. I last saw the patient September 2016. Seen recently in this office by the GI physician assistant just 3 weeks ago. She is accompanied today by her daughter. Chief complaint is drifting hemoglobin and Hemoccult-positive stool. She is chronically anemic with hemoglobin at baseline of approximately 10. Or recent hemoglobin 8.5 for which she was transfused. No melena or hematochezia significance. She was hospitalized on 2 occasions recently for hepatic encephalopathy. Now on lactulose and Xifaxan. Encephalopathy has resolved. May have questions regarding her medications and her anemia. Despite a history of chronic diarrhea and currently using lactulose 25 mL 3 times a day patient reports a more stubborn bowel habits. No abdominal pain. Mild weight loss and nausea.  REVIEW OF SYSTEMS:  All non-GI ROS negative except for anxiety, confusion, irregular heart, hearing problems, heart murmur  Past Medical History  Diagnosis Date  . Aortic stenosis     moderate by echo 6/11 gradient 20/31mmHg for mean and peak  . TIA (transient ischemic attack)     Dr. [name]  . Peripheral vascular disease ([place])   . IBS (irritable bowel syndrome)   . Diabetes mellitus     type II  . Chronic bronchitis   . COPD (chronic obstructive pulmonary disease) ([place])   . Atherosclerosis   . Depression   . Anxiety     Dr Casimiro Needle  . Renal cyst   . Anemia     iron deficiency  . Cellulitis   . Diverticular disease   . Infectious diarrhea(009.2)   . Sinusitis   . Thrush   . Abnormal liver function test   . GERD  (gastroesophageal reflux disease)   . Insomnia     chronic  . Adenocarcinoma ([place]) 2008    colon,s/p right hemicolectomy  . Angina   . Seizures ([facility])     hx of one seizure "  . Cirrhosis of liver ([facility])     hx of  . Clostridium difficile diarrhea   . Thrombocytopenia ([place]) 04/01/2013  . Bursitis     spine per pt  . Macular degeneration 02/18/2014    Follows with [facility]  . Internal hemorrhoids   . Asthma   . Elevated BP 07/09/2015  . Type 2 diabetes mellitus with vascular disease ([place]) 03/02/2007    Chronic    . Encephalopathy, metabolic   . Protein calorie malnutrition [facility])     Past Surgical History  Procedure Laterality Date  . Carotid endarterectomy    . Colectomy      right hemi  . Cholecystectomy  11/09  . Upper gastrointestinal endoscopy    . Tonsillectomy    . Appendectomy      Social History Monique Fleming  reports that she quit smoking about 2 months ago. Her smoking use included Cigarettes. She started smoking about 65 years ago. She has a 35 pack-year smoking history. She has never used smokeless tobacco. She reports that she does not drink alcohol or use illicit drugs.  family history includes Colon cancer in her paternal grandmother; Diabetes in her brother; Esophageal cancer in her brother; Heart disease in her mother; Hypertension in her father. There is  no history of Rectal cancer or Stomach cancer.  Allergies  Allergen Reactions  . Bactrim [Sulfamethoxazole-Trimethoprim] Shortness Of Breath  . Levofloxacin Other (See Comments)    seizure  . Pneumovax [Pneumococcal Polysaccharide Vaccine] Other (See Comments)    Severe allergy-cellulitis  . Amoxicillin-Pot Clavulanate Other (See Comments)    Patient stated Drs. recommend taking Augmentin due to her PCN allergy.  . Penicillins Rash    Has patient had a PCN reaction causing immediate rash, facial/tongue/throat swelling, SOB or lightheadedness with hypotension: No Has patient had a PCN reaction causing  severe rash involving mucus membranes or skin necrosis: No Has patient had a PCN reaction that required hospitalization No Has patient had a PCN reaction occurring within the last 10 years: No If all of the above answers are "NO", then may proceed with Cephalosporin use.   . Bupropion   . Haldol [Haloperidol Lactate]     Had unknown reaction many yrs ago per pt  . Haloperidol   . Morphine   . Morphine And Related     Confused and agitation  . Pneumococcal Vaccine   . Pseudoephedrine Hcl   . Sudafed [Pseudoephedrine Hcl] Other (See Comments)    seizure       PHYSICAL EXAMINATION: Vital signs: BP 88/50 mmHg  Pulse 68  Ht 5\' 5"  (1.651 m)  Wt 123 lb (55.792 kg)  BMI 20.47 kg/m2  Constitutional: Chronically ill-appearing, frail, no acute distress Psychiatric: alert and oriented x3, cooperative Eyes: extraocular movements intact, anicteric, conjunctiva pink Mouth: oral pharynx moist, no lesions Neck: supple no lymphadenopathy Cardiovascular: heart regular rate and rhythm, systolic murmur present Lungs: clear to auscultation bilaterally Abdomen: soft, nontender, nondistended, no obvious ascites, no peritoneal signs, normal bowel sounds, no organomegaly Rectal: Omitted Extremities: no clubbing cyanosis or lower extremity edema bilaterally Skin: no lesions on visible extremities. She does have spider telangiectasias Neuro: . Hard of hearing. No focal deficits. No asterixis.  ASSESSMENT:  #1. Hepatic cirrhosis with portal hypertension. #2. Hepatic encephalopathy. Responding to Xifaxan and lactulose #3. Chronic anemia. Multifactorial. Reasons for Hemoccult positive stool include known gastrointestinal AVMs, portal gastropathy, and hemorrhoids #4. Chronic diarrhea. Currently doing well. Not requiring Questran #5. History of colon cancer status post resection. Last colonoscopy 2014 without neoplasia post right hemicolectomy #6. History of functional abdominal complaints #7. Multiple  significant medical problems.   PLAN:  #1. Continue Xifaxan and lactulose for hepatic encephalopathy #2. Low sodium diet #3. Continue with iron infusion and monitoring of hemoglobin per hematology. Transfusion as needed. No indication for endoscopy currently. Discussed #4. Resume care with primary providers. GI follow-up 3 months  25 minutes was spent face-to-face with the patient. Greater than 50% a time use for counseling regarding her liver disease, hepatic encephalopathy, bowel habits, anemia, and medications. Also answering her questions and questions from her daughter.

## 2016-03-29 ENCOUNTER — Encounter: Payer: Self-pay | Admitting: Family Medicine

## 2016-03-29 DIAGNOSIS — M4800 Spinal stenosis, site unspecified: Secondary | ICD-10-CM

## 2016-03-29 HISTORY — DX: Spinal stenosis, site unspecified: M48.00

## 2016-03-29 NOTE — Assessment & Plan Note (Signed)
Encouraged topical treatments and moist heat.

## 2016-03-29 NOTE — Assessment & Plan Note (Signed)
Requiring significant help and care from her family at this time. Is a very high risk for falls due to weakness, back pain secondary to spinal stenosis and poor sequencing. She has [facility] for [facility] today.

## 2016-03-29 NOTE — Assessment & Plan Note (Signed)
Well controlled. Encouraged heart healthy diet such as the DASH diet and exercise as tolerated.  

## 2016-03-29 NOTE — Assessment & Plan Note (Signed)
hgba1c acceptable, minimize simple carbs. Increase exercise as tolerated. Continue current meds 

## 2016-03-29 NOTE — Progress Notes (Signed)
Patient ID: Monique Fleming, female   DOB: 1929/05/07, 82 y.o.   MRN: [id number]   Subjective:    Patient ID: Monique Fleming, female    DOB: [birth year], 82 y.o.   MRN: [id number]  Chief Complaint  Patient presents with  . Follow-up    [facility] form    HPI Patient is in today for follow up to help her family get some forms completed. She continues to struggle with daily pain and weakness. Is at high risk for falls due to multiple health and chronic back pain. She also struggles with ongoing memory loss due to encephalopathy and dementia. Denies CP/palp/SOB/HA/congestion/fevers/GI or GU c/o. Taking meds as prescribed Past Medical History  Diagnosis Date  . Aortic stenosis     moderate by echo 6/11 gradient 20/24mHg for mean and peak  . TIA (transient ischemic attack)     Dr. SLeonie Man . Peripheral vascular disease ([place]   . IBS (irritable bowel syndrome)   . Diabetes mellitus     type II  . Chronic bronchitis   . COPD (chronic obstructive pulmonary disease) ([place]   . Atherosclerosis   . Depression   . Anxiety     Dr PCasimiro Needle . Renal cyst   . Anemia     iron deficiency  . Cellulitis   . Diverticular disease   . Infectious diarrhea(009.2)   . Sinusitis   . Thrush   . Abnormal liver function test   . GERD (gastroesophageal reflux disease)   . Insomnia     chronic  . Adenocarcinoma ([place] 2008    colon,s/p right hemicolectomy  . Angina   . Seizures (HCC)     hx of one seizure "  . Cirrhosis of liver (HCC)     hx of  . Clostridium difficile diarrhea   . Thrombocytopenia ([place] 04/01/2013  . Bursitis     spine per pt  . Macular degeneration 02/18/2014    [facility] Internal hemorrhoids   . Asthma   . Elevated BP 07/09/2015  . Type 2 diabetes mellitus with vascular disease ([place] 03/02/2007    Chronic    . Encephalopathy, metabolic   . Protein calorie malnutrition ([place]   . Spinal stenosis 03/29/2016    Past Surgical History  Procedure Laterality Date  . Carotid  endarterectomy    . Colectomy      right hemi  . Cholecystectomy  11/09  . Upper gastrointestinal endoscopy    . Tonsillectomy    . Appendectomy      Family History  Problem Relation Age of Onset  . Colon cancer Paternal Grandmother   . Esophageal cancer Brother   . Heart disease Mother   . Diabetes Brother   . Hypertension Father   . Rectal cancer Neg Hx   . Stomach cancer Neg Hx     Social History   Social History  . Marital Status: Widowed    Spouse Name: N/A  . Number of Children: 3  . Years of Education: N/A   Occupational History  . retired   .     Social History Main Topics  . Smoking status: Former Smoker -- 0.50 packs/day for 70 years    Types: Cigarettes    Start date: 02/14/1951    Quit date: 01/25/2016  . Smokeless tobacco: Never Used     Comment: 01/2016 Quit  . Alcohol Use: No  . Drug Use: No  . Sexual Activity: No   Other  Topics Concern  . Not on file   Social History Narrative   Patient signed a Designated Party Release to allow her daughter, Jaquel Coomer, to have access to her medical records/information. Entered by Fleet Contras March 24,2011 @ 2:47 pm   Dailly Caffeine Use    Outpatient Prescriptions Prior to Visit  Medication Sig Dispense Refill  . albuterol (PROAIR HFA) 108 (90 BASE) MCG/ACT inhaler Inhale 2 puffs into the lungs every 4 (four) hours as needed for wheezing or shortness of breath. 3 Inhaler 2  . cholestyramine (QUESTRAN) 4 g packet MIX 1 PACKET (4 GRAM TOTAL) IN LIQUID AND DRINK DAILY AS NEEDED FOR DIARRHEA 180 each 0  . clonazePAM (KLONOPIN) 1 MG tablet Take 1 tablet (1 mg total) by mouth 2 (two) times daily as needed for anxiety. (Patient taking differently: Take 0.5-1 mg by mouth 2 (two) times daily as needed for anxiety. Can take 0.5 tablet during the day if needed. She rarely takes it per daughter) 15 tablet 0  . clopidogrel (PLAVIX) 75 MG tablet TAKE 1 TABLET DAILY 90 tablet 2  . desvenlafaxine (PRISTIQ) 100 MG 24 hr  tablet Take 100 mg by mouth daily.    . fluticasone (FLONASE) 50 MCG/ACT nasal spray USE 2 SPRAYS IN EACH NOSTRIL DAILY AS NEEDED FOR ALLERGIES 48 g 2  . formoterol (PERFOROMIST) 20 MCG/2ML nebulizer solution Take 2 mLs (20 mcg total) by nebulization 2 (two) times daily. (Patient taking differently: Take 20 mcg by nebulization 2 (two) times daily as needed. For sob & wheezing) 120 mL 5  . glucose blood (BAYER CONTOUR NEXT TEST) test strip Use as directed once daily to check blood sugar.  DX E11.9 100 each 12  . lactulose (CHRONULAC) 10 GM/15ML solution Take 45 ml's 3 times daily. 4050 mL 1  . Magnesium 400 MG TABS Take 400 mg by mouth 2 (two) times daily. 180 tablet 0  . metFORMIN (GLUCOPHAGE) 500 MG tablet TAKE ONE-HALF (1/2) TO ONE TABLET DAILY WITH BREAKFAST 90 tablet 1  . Multiple Vitamins-Minerals (OCUVITE PRESERVISION) TABS Take by mouth 2 (two) times daily.    . Nutritional Supplements (CARNATION INSTANT BREAKFAST PO) Take 1 each by mouth 3 (three) times daily.    . ondansetron (ZOFRAN) 4 MG tablet Take 1 tablet (4 mg total) by mouth every 6 (six) hours as needed for nausea or vomiting. 60 tablet 0  . phenytoin (DILANTIN) 100 MG ER capsule Take 2 capsules (200 mg total) by mouth at bedtime. Reported on 02/13/2016 60 capsule 2  . Polyethyl Glycol-Propyl Glycol (SYSTANE) 0.4-0.3 % SOLN Place 1 drop into both eyes 2 (two) times daily as needed (dry eyes).     . potassium chloride (K-DUR) 10 MEQ tablet Take 1 tablet (10 mEq total) by mouth 2 (two) times daily. 60 tablet 3  . ranitidine (ZANTAC) 300 MG tablet Take 1 tablet daily. 90 tablet 3  . rifaximin (XIFAXAN) 200 MG tablet Take 1 tablet (200 mg total) by mouth 3 (three) times daily. 42 tablet 0  . saccharomyces boulardii (FLORASTOR) 250 MG capsule Take 250 mg by mouth daily.    . sodium chloride (OCEAN) 0.65 % SOLN nasal spray Place 2 sprays into both nostrils daily as needed for congestion.     . traMADol (ULTRAM) 50 MG tablet Take 1 tablet  (50 mg total) by mouth every 12 (twelve) hours as needed for severe pain. 15 tablet 0  . montelukast (SINGULAIR) 10 MG tablet Take 10 mg by mouth at bedtime.    Marland Kitchen  pantoprazole (PROTONIX) 40 MG tablet TAKE 1 TABLET EVERY MORNING 90 tablet 2  . rifaximin (XIFAXAN) 550 MG TABS tablet Take 1 tablet (550 mg total) by mouth 2 (two) times daily. (Patient not taking: Reported on 03/19/2016) 60 tablet 6   No facility-administered medications prior to visit.    Allergies  Allergen Reactions  . Bactrim [Sulfamethoxazole-Trimethoprim] Shortness Of Breath  . Levofloxacin Other (See Comments)    seizure  . Pneumovax [Pneumococcal Polysaccharide Vaccine] Other (See Comments)    Severe allergy-cellulitis  . Amoxicillin-Pot Clavulanate Other (See Comments)    Patient stated Drs. recommend taking Augmentin due to her PCN allergy.  . Penicillins Rash    Has patient had a PCN reaction causing immediate rash, facial/tongue/throat swelling, SOB or lightheadedness with hypotension: No Has patient had a PCN reaction causing severe rash involving mucus membranes or skin necrosis: No Has patient had a PCN reaction that required hospitalization No Has patient had a PCN reaction occurring within the last 10 years: No If all of the above answers are "NO", then may proceed with Cephalosporin use.   . Bupropion   . Haldol [Haloperidol Lactate]     Had unknown reaction many yrs ago per pt  . Haloperidol   . Morphine   . Morphine And Related     Confused and agitation  . Pneumococcal Vaccine   . Pseudoephedrine Hcl   . Sudafed [Pseudoephedrine Hcl] Other (See Comments)    seizure    Review of Systems  Constitutional: Positive for malaise/fatigue. Negative for fever.  HENT: Negative for congestion.   Eyes: Negative for blurred vision.  Respiratory: Negative for shortness of breath.   Cardiovascular: Negative for chest pain, palpitations and leg swelling.  Gastrointestinal: Negative for nausea, abdominal pain  and blood in stool.  Genitourinary: Negative for dysuria and frequency.  Musculoskeletal: Positive for myalgias, back pain, joint pain and falls.  Skin: Negative for rash.  Neurological: Positive for dizziness and weakness. Negative for loss of consciousness and headaches.  Endo/Heme/Allergies: Negative for environmental allergies.  Psychiatric/Behavioral: Positive for memory loss. Negative for depression. The patient is nervous/anxious.        Objective:    Physical Exam  Constitutional: She is oriented to person, place, and time. She appears well-developed and well-nourished. No distress.  HENT:  Head: Normocephalic and atraumatic.  Nose: Nose normal.  Eyes: Right eye exhibits no discharge. Left eye exhibits no discharge.  Neck: Normal range of motion. Neck supple.  Cardiovascular: Normal rate and regular rhythm.   Pulmonary/Chest: Effort normal and breath sounds normal.  Abdominal: Soft. Bowel sounds are normal. There is no tenderness.  Musculoskeletal: She exhibits no edema.  Neurological: She is alert and oriented to person, place, and time.  Skin: Skin is warm and dry.  Psychiatric: She has a normal mood and affect.  Nursing note and vitals reviewed.   BP 140/68 mmHg  Pulse 93  Temp(Src) 97.7 F (36.5 C) (Oral)  Ht _0  (1.626 m)  Wt 124 lb 4 oz (56.359 kg)  BMI 21.32 kg/m2  SpO2 94% Wt Readings from Last 3 Encounters:  03/26/16 123 lb (55.792 kg)  03/19/16 122 lb (55.339 kg)  03/19/16 122 lb (55.339 kg)     Lab Results  Component Value Date   WBC 4.5 03/20/2016   HGB 10.8* 03/20/2016   HCT 33.7* 03/20/2016   PLT 97* 03/20/2016   GLUCOSE 310* 03/19/2016   CHOL 129 03/05/2016   TRIG 136.0 03/05/2016   HDL 33.10* 03/05/2016  LDLDIRECT 124.3 03/20/2010   LDLCALC 68 03/05/2016   ALT 22 03/19/2016   AST 46* 03/19/2016   NA 137 03/19/2016   K 4.6 03/19/2016   CL 104 03/05/2016   CREATININE 0.9 03/19/2016   BUN 12.1 03/19/2016   CO2 24 03/19/2016   TSH  1.36 03/05/2016   INR 1.3* 03/05/2016   HGBA1C 5.1 03/05/2016    Lab Results  Component Value Date   TSH 1.36 03/05/2016   Lab Results  Component Value Date   WBC 4.5 03/20/2016   HGB 10.8* 03/20/2016   HCT 33.7* 03/20/2016   MCV 97 03/20/2016   PLT 97* 03/20/2016   Lab Results  Component Value Date   NA 137 03/19/2016   K 4.6 03/19/2016   CHLORIDE 106 03/19/2016   CO2 24 03/19/2016   GLUCOSE 310* 03/19/2016   BUN 12.1 03/19/2016   CREATININE 0.9 03/19/2016   BILITOT 0.45 03/19/2016   ALKPHOS 102 03/19/2016   AST 46* 03/19/2016   ALT 22 03/19/2016   PROT 6.2* 03/19/2016   ALBUMIN 2.8* 03/19/2016   CALCIUM 8.7 03/19/2016   ANIONGAP 8 03/19/2016   EGFR 58* 03/19/2016   GFR 63.32 03/05/2016   Lab Results  Component Value Date   CHOL 129 03/05/2016   Lab Results  Component Value Date   HDL 33.10* 03/05/2016   Lab Results  Component Value Date   LDLCALC 68 03/05/2016   Lab Results  Component Value Date   TRIG 136.0 03/05/2016   Lab Results  Component Value Date   CHOLHDL 4 03/05/2016   Lab Results  Component Value Date   HGBA1C 5.1 03/05/2016       Assessment & Plan:   Problem List Items Addressed This Visit    Type 2 diabetes mellitus with vascular disease ([place]) - Primary    hgba1c acceptable, minimize simple carbs. Increase exercise as tolerated. Continue current meds      Spinal stenosis    Encouraged topical treatments and moist heat.       Gait instability    Requiring significant help and care from her family at this time. Is a very high risk for falls due to weakness, back pain secondary to spinal stenosis and poor sequencing. She [facility].      Elevated BP    Well controlled. Encouraged heart healthy diet such as the DASH diet and exercise as tolerated.          I am having Ms. Staat maintain her OCUVITE PRESERVISION, saccharomyces boulardii, Polyethyl Glycol-Propyl Glycol, sodium chloride, albuterol,  formoterol, desvenlafaxine, ondansetron, fluticasone, clopidogrel, cholestyramine, clonazePAM, traMADol, phenytoin, glucose blood, Nutritional Supplements (CARNATION INSTANT BREAKFAST PO), Magnesium, rifaximin, potassium chloride, ranitidine, lactulose, and metFORMIN.  No orders of the defined types were placed in this encounter.     Penni Homans, MD

## 2016-03-30 NOTE — Progress Notes (Signed)
Reviewed & agree with plan  

## 2016-03-31 ENCOUNTER — Encounter: Payer: Self-pay | Admitting: Family

## 2016-03-31 ENCOUNTER — Telehealth: Payer: Self-pay | Admitting: Family Medicine

## 2016-03-31 NOTE — Telephone Encounter (Signed)
Please give home health verbal order to check cbc for anemia

## 2016-03-31 NOTE — Telephone Encounter (Signed)
Faxed the forms the patient and our office filled out.  Faxed them on 03-25-2016. Dr. Scarlette Shorts and Alonza Bogus PA signed the forms.  Got a call from the daughter Jan Brist stating that [facility] told her they did not get the forms by fax.  I was able to call [facility] at [phone] and speak to a pharmacist who just needed Dr. Henrene Pastor and Jessica's information, NPI numbers.  .  The medication will be mailed to the patient in a few days per "Stanton Kidney" Pharmacist at [facility].

## 2016-03-31 NOTE — Telephone Encounter (Signed)
[facility] health nurse  - Nanine Means [phone]   Called in because she is scheduled to go out to pt's home tomorrow. She says that she need verbal orders to check pt's hemoglobin also. She says that pt's daughter would like to have her check it.

## 2016-04-01 NOTE — Telephone Encounter (Signed)
I do not believe she will continue to need weekly labs. Have them draw a ammonia level and cmp right before she is discharged from [facility] and then we can check them again 2 weeks after that.

## 2016-04-01 NOTE — Telephone Encounter (Signed)
Verbal orders called to Monique Fleming as directed. HHRN also states that pt will be discharged from [facility] next week because she is getting to the point where she will not be home bound much longer. Pt's daughter asked her if pt will need to continue having weekly labs, and if so, how/when should she set this up. Please return call to pt's daughter.

## 2016-04-02 ENCOUNTER — Telehealth: Payer: Self-pay | Admitting: Family

## 2016-04-02 ENCOUNTER — Encounter: Payer: Self-pay | Admitting: Family Medicine

## 2016-04-02 DIAGNOSIS — G4700 Insomnia, unspecified: Secondary | ICD-10-CM

## 2016-04-02 NOTE — Telephone Encounter (Signed)
I left a message on the patient's daughter Monique Fleming personal phone letting her know we would be happy to coordinate lab work with her visits to Dr. Randel Pigg when they overlap with visits to our office. We will plan to repeat lab work at her at her follow-up with Dr. Marin Olp in July. She can call our office to help coordinate any other appointments.

## 2016-04-02 NOTE — Telephone Encounter (Signed)
Called  HHRN left a detailed message of PCP instructions and called the daughter as well  Informed of PCP instructions regarding labs.

## 2016-04-05 NOTE — Progress Notes (Deleted)
Note opened in error.

## 2016-04-05 NOTE — Progress Notes (Signed)
Not sure who opened this note but I cannot close it can you help?

## 2016-04-06 ENCOUNTER — Encounter: Payer: Self-pay | Admitting: Family Medicine

## 2016-04-08 ENCOUNTER — Telehealth: Payer: Self-pay | Admitting: *Deleted

## 2016-04-08 NOTE — Telephone Encounter (Signed)
Forwarded to Dr. Charlett Blake for review/signature. JG//CMA

## 2016-04-08 NOTE — Telephone Encounter (Signed)
So her ammonia is essentially stable so continue meds but will not change them. Magnesium is low, confirm she is still taking Magnesium 400 mg bid, if she is increase to tid if not restart. Will recheck with next ammonia in about 2 week.

## 2016-04-08 NOTE — Telephone Encounter (Signed)
OK to refill tramadol, same strength, same sig #30

## 2016-04-08 NOTE — Telephone Encounter (Signed)
Received lab result via fax from [facility] for Ammonia. The result indicate is 103.

## 2016-04-08 NOTE — Telephone Encounter (Signed)
Rx request received via fax for tramadol 50 mg tablets.  Last OV: 03/17/16 Last filled: 01/22/16 by Dr. Barton Dubois ([facility]), #15, 0 RF Sig: Take 1 tablet (50 mg total) by mouth every 12 (twelve) hours as needed for severe pain. UDS: Not on file

## 2016-04-08 NOTE — Telephone Encounter (Signed)
Lab result received via fax from [place].  Magnesium 1.5  Forwarded to Dr. Charlett Blake. JG//CMA

## 2016-04-09 MED ORDER — TRAMADOL HCL 50 MG PO TABS: 50.0000 mg | ORAL_TABLET | Freq: Two times a day (BID) | ORAL | Status: DC | PRN

## 2016-04-09 NOTE — Telephone Encounter (Signed)
Rx faxed to pharmacy by Sharon Seller, CMA.

## 2016-04-09 NOTE — Telephone Encounter (Signed)
Rx printed, to PCP for signature. 

## 2016-04-10 ENCOUNTER — Ambulatory Visit: Payer: Medicare Other

## 2016-04-10 ENCOUNTER — Ambulatory Visit (HOSPITAL_BASED_OUTPATIENT_CLINIC_OR_DEPARTMENT_OTHER): Payer: Medicare Other | Admitting: Family

## 2016-04-10 ENCOUNTER — Other Ambulatory Visit (HOSPITAL_BASED_OUTPATIENT_CLINIC_OR_DEPARTMENT_OTHER): Payer: Medicare Other

## 2016-04-10 ENCOUNTER — Encounter: Payer: Self-pay | Admitting: Family

## 2016-04-10 ENCOUNTER — Telehealth: Payer: Self-pay | Admitting: Pulmonary Disease

## 2016-04-10 VITALS — BP 148/41 | HR 75 | Temp 97.8°F | Resp 20 | Ht 65.0 in | Wt 122.0 lb

## 2016-04-10 DIAGNOSIS — Z85038 Personal history of other malignant neoplasm of large intestine: Secondary | ICD-10-CM | POA: Diagnosis not present

## 2016-04-10 DIAGNOSIS — D509 Iron deficiency anemia, unspecified: Secondary | ICD-10-CM

## 2016-04-10 DIAGNOSIS — K641 Second degree hemorrhoids: Secondary | ICD-10-CM

## 2016-04-10 DIAGNOSIS — D500 Iron deficiency anemia secondary to blood loss (chronic): Secondary | ICD-10-CM

## 2016-04-10 DIAGNOSIS — J4489 Other specified chronic obstructive pulmonary disease: Secondary | ICD-10-CM

## 2016-04-10 DIAGNOSIS — J449 Chronic obstructive pulmonary disease, unspecified: Secondary | ICD-10-CM

## 2016-04-10 LAB — IRON AND TIBC
%SAT: 21 % (ref 21–57)
Iron: 57 ug/dL (ref 41–142)
TIBC: 275 ug/dL (ref 236–444)
UIBC: 218 ug/dL (ref 120–384)

## 2016-04-10 LAB — COMPREHENSIVE METABOLIC PANEL
ALT: 26 U/L (ref 0–55)
AST: 51 U/L — AB (ref 5–34)
Albumin: 2.9 g/dL — ABNORMAL LOW (ref 3.5–5.0)
Alkaline Phosphatase: 138 U/L (ref 40–150)
Anion Gap: 8 mEq/L (ref 3–11)
BUN: 13.6 mg/dL (ref 7.0–26.0)
CHLORIDE: 106 meq/L (ref 98–109)
CO2: 26 meq/L (ref 22–29)
Calcium: 9.1 mg/dL (ref 8.4–10.4)
Creatinine: 0.9 mg/dL (ref 0.6–1.1)
EGFR: 63 mL/min/{1.73_m2} — AB (ref >90)
GLUCOSE: 179 mg/dL — AB (ref 70–140)
POTASSIUM: 4.7 meq/L (ref 3.5–5.1)
SODIUM: 140 meq/L (ref 136–145)
Total Bilirubin: 0.48 mg/dL (ref 0.20–1.20)
Total Protein: 6.8 g/dL (ref 6.4–8.3)

## 2016-04-10 LAB — CBC WITH DIFFERENTIAL (CANCER CENTER ONLY)
BASO#: 0 10*3/uL (ref 0.0–0.2)
BASO%: 0.5 % (ref 0.0–2.0)
EOS ABS: 0.3 10*3/uL (ref 0.0–0.5)
EOS%: 4.9 % (ref 0.0–7.0)
HCT: 34.1 % — ABNORMAL LOW (ref 34.8–46.6)
HGB: 11.3 g/dL — ABNORMAL LOW (ref 11.6–15.9)
LYMPH#: 0.8 10*3/uL — ABNORMAL LOW (ref 0.9–3.3)
LYMPH%: 13.9 % — AB (ref 14.0–48.0)
MCH: 30.8 pg (ref 26.0–34.0)
MCHC: 33.1 g/dL (ref 32.0–36.0)
MCV: 93 fL (ref 81–101)
MONO#: 0.7 10*3/uL (ref 0.1–0.9)
MONO%: 11.9 % (ref 0.0–13.0)
NEUT%: 68.8 % (ref 39.6–80.0)
NEUTROS ABS: 3.8 10*3/uL (ref 1.5–6.5)
PLATELETS: 116 10*3/uL — AB (ref 145–400)
RBC: 3.67 10*6/uL — ABNORMAL LOW (ref 3.70–5.32)
RDW: 14.9 % (ref 11.1–15.7)
WBC: 5.5 10*3/uL (ref 3.9–10.0)

## 2016-04-10 LAB — FERRITIN: FERRITIN: 42 ng/mL (ref 9–269)

## 2016-04-10 LAB — LACTATE DEHYDROGENASE: LDH: 236 U/L (ref 125–245)

## 2016-04-10 NOTE — Progress Notes (Signed)
Hematology and Oncology Follow Up Visit  Monique Fleming [id number] 06-Oct-1928 82 y.o. 04/10/2016   Principle Diagnosis:  1. Stage I (T1 N0 M0) adenocarcinoma of the right colon 2. Intermittent iron deficiency anemia secondary to chronic blood loss  Current Therapy:   IV iron as indicated     Interim History: Monique Fleming is here today with a friend for follow-up. She states that she is feeling better but still has pain in her right shoulder and arm. She states that this is due to arthritis and rotator cuff impingement. She is currently taking Ultram 50 mg at bedtime and does not feel that this is helping. She plans to speak with Dr. Charlett Blake next week at her appointment about adjusting her medication dosage.  With the shoulder discomfort she has had some problems being able to rest well at night.  Her Hgb is stable at 11.3 and an MCV of 93. She is unsure if she has had any more dark tarry stools. She did follow-up with Dr. Henrene Pastor regarding her positive occult blood test. We will continue to monitor her CBC.  Her SOB is unchanged and she uses supplemental O2 at night. Her O2 machine has a "red light on." she plans to follow-up with [facility] and also Dr. Bari Mantis office regarding having the machine serviced to make sure it is functioning appropriately.  No fever, chills, n/v, cough, rash, dizziness, headache, chest pain, abdominal pain or changes in bladder habits.  She is now taking xifaxin TID and Lactulose as needed to help keep her ammonia level down.  No episodes of bruising. No lymphadenopathy found on exam.  No swelling, tenderness, numbness or tingling in her extremities.  Her appetite comes and goes but she is staying hydrated. Her weight is unchanged.    Medications:    Medication List       This list is accurate as of: 04/10/16  9:42 AM.  Always use your most recent med list.               albuterol 108 (90 Base) MCG/ACT inhaler  Commonly known as:  PROAIR HFA  Inhale 2  puffs into the lungs every 4 (four) hours as needed for wheezing or shortness of breath.     CARNATION INSTANT BREAKFAST PO  Take 1 each by mouth 3 (three) times daily.     cholestyramine 4 g packet  Commonly known as:  QUESTRAN  MIX 1 PACKET (4 GRAM TOTAL) IN LIQUID AND DRINK DAILY AS NEEDED FOR DIARRHEA     clonazePAM 1 MG tablet  Commonly known as:  KLONOPIN  Take 1 tablet (1 mg total) by mouth 2 (two) times daily as needed for anxiety.     clopidogrel 75 MG tablet  Commonly known as:  PLAVIX  TAKE 1 TABLET DAILY     desvenlafaxine 100 MG 24 hr tablet  Commonly known as:  PRISTIQ  Take 100 mg by mouth daily.     fluticasone 50 MCG/ACT nasal spray  Commonly known as:  FLONASE  USE 2 SPRAYS IN EACH NOSTRIL DAILY AS NEEDED FOR ALLERGIES     formoterol 20 MCG/2ML nebulizer solution  Commonly known as:  PERFOROMIST  Take 2 mLs (20 mcg total) by nebulization 2 (two) times daily.     glucose blood test strip  Commonly known as:  BAYER CONTOUR NEXT TEST  Use as directed once daily to check blood sugar.  DX E11.9     lactulose 10 GM/15ML solution  Commonly known as:  CHRONULAC  Take 45 ml's 3 times daily.     Magnesium 400 MG Tabs  Take 400 mg by mouth 2 (two) times daily.     metFORMIN 500 MG tablet  Commonly known as:  GLUCOPHAGE  TAKE ONE-HALF (1/2) TO ONE TABLET DAILY WITH BREAKFAST     montelukast 10 MG tablet  Commonly known as:  SINGULAIR  Take 1 tablet (10 mg total) by mouth at bedtime.     OCUVITE PRESERVISION Tabs  Take by mouth 2 (two) times daily.     ondansetron 4 MG tablet  Commonly known as:  ZOFRAN  Take 1 tablet (4 mg total) by mouth every 6 (six) hours as needed for nausea or vomiting.     phenytoin 100 MG ER capsule  Commonly known as:  DILANTIN  Take 2 capsules (200 mg total) by mouth at bedtime. Reported on 02/13/2016     potassium chloride 10 MEQ tablet  Commonly known as:  K-DUR  Take 1 tablet (10 mEq total) by mouth 2 (two) times daily.      ranitidine 300 MG tablet  Commonly known as:  ZANTAC  Take 1 tablet daily.     rifaximin 200 MG tablet  Commonly known as:  XIFAXAN  Take 1 tablet (200 mg total) by mouth 3 (three) times daily.     saccharomyces boulardii 250 MG capsule  Commonly known as:  FLORASTOR  Take 250 mg by mouth daily.     sodium chloride 0.65 % Soln nasal spray  Commonly known as:  OCEAN  Place 2 sprays into both nostrils daily as needed for congestion.     SYSTANE 0.4-0.3 % Soln  Generic drug:  Polyethyl Glycol-Propyl Glycol  Place 1 drop into both eyes 2 (two) times daily as needed (dry eyes).     traMADol 50 MG tablet  Commonly known as:  ULTRAM  Take 1 tablet (50 mg total) by mouth every 12 (twelve) hours as needed for severe pain.        Allergies:  Allergies  Allergen Reactions  . Bactrim [Sulfamethoxazole-Trimethoprim] Shortness Of Breath  . Levofloxacin Other (See Comments)    seizure  . Pneumovax [Pneumococcal Polysaccharide Vaccine] Other (See Comments)    Severe allergy-cellulitis  . Amoxicillin-Pot Clavulanate Other (See Comments)    Patient stated Drs. recommend taking Augmentin due to her PCN allergy.  . Penicillins Rash    Has patient had a PCN reaction causing immediate rash, facial/tongue/throat swelling, SOB or lightheadedness with hypotension: No Has patient had a PCN reaction causing severe rash involving mucus membranes or skin necrosis: No Has patient had a PCN reaction that required hospitalization No Has patient had a PCN reaction occurring within the last 10 years: No If all of the above answers are "NO", then may proceed with Cephalosporin use.   . Bupropion   . Haldol [Haloperidol Lactate]     Had unknown reaction many yrs ago per pt  . Haloperidol   . Morphine   . Morphine And Related     Confused and agitation  . Pneumococcal Vaccine   . Pseudoephedrine Hcl   . Sudafed [Pseudoephedrine Hcl] Other (See Comments)    seizure    Past Medical History,  Surgical history, Social history, and Family History were reviewed and updated.  Review of Systems: All other 10 point review of systems is negative.   Physical Exam:  height is 5\' 5"  (1.651 m) and weight is 122 lb (55.339 kg). Her  oral temperature is 97.8 F (36.6 C). Her blood pressure is 148/41 and her pulse is 75. Her respiration is 20.   Wt Readings from Last 3 Encounters:  04/10/16 122 lb (55.339 kg)  03/26/16 123 lb (55.792 kg)  03/19/16 122 lb (55.339 kg)    Ocular: Sclerae unicteric, pupils equal, round and reactive to light Ear-nose-throat: Oropharynx clear, dentition fair Lymphatic: No cervical supraclavicular or axillary adenopathy Lungs no rales or rhonchi, good excursion bilaterally Heart regular rate and rhythm, no murmur appreciated Abd soft, nontender, positive bowel sounds, newly liver or spleen tip palpated on exam, no fluid wave MSK no focal spinal tenderness, no joint edema Neuro: non-focal, well-oriented, appropriate affect Breasts: Deferred  Lab Results  Component Value Date   WBC 5.5 04/10/2016   HGB 11.3* 04/10/2016   HCT 34.1* 04/10/2016   MCV 93 04/10/2016   PLT 116* 04/10/2016   Lab Results  Component Value Date   FERRITIN 69 03/19/2016   IRON 37* 03/19/2016   TIBC 230* 03/19/2016   UIBC 193 03/19/2016   IRONPCTSAT 16* 03/19/2016   Lab Results  Component Value Date   RETICCTPCT 1.4 07/25/2015   RBC 3.67* 04/10/2016   RETICCTABS 55.3 07/25/2015   No results found for: KPAFRELGTCHN, LAMBDASER, [facility] Lab Results  Component Value Date   IGA 507* 08/23/2014   No results found for: Odetta Pink, SPEI   Chemistry      Component Value Date/Time   NA 137 03/19/2016 1031   NA 137 03/05/2016 1223   NA 145 01/27/2016   NA 134 09/10/2015 1054   K 4.6 03/19/2016 1031   K 4.6 03/05/2016 1223   K 3.1* 09/10/2015 1054   CL 104 03/05/2016 1223   CL 100 09/10/2015 1054   CO2 24  03/19/2016 1031   CO2 27 03/05/2016 1223   CO2 26 09/10/2015 1054   BUN 12.1 03/19/2016 1031   BUN 19 03/05/2016 1223   BUN 10 01/27/2016   BUN 7 09/10/2015 1054   CREATININE 0.9 03/19/2016 1031   CREATININE 0.90 03/05/2016 1223   CREATININE 0.8 01/27/2016   CREATININE 0.7 09/10/2015 1054   GLU 88 01/27/2016      Component Value Date/Time   CALCIUM 8.7 03/19/2016 1031   CALCIUM 9.0 03/05/2016 1223   CALCIUM 8.2 09/10/2015 1054   ALKPHOS 102 03/19/2016 1031   ALKPHOS 106 03/05/2016 1223   ALKPHOS 100* 09/10/2015 1054   AST 46* 03/19/2016 1031   AST 54* 03/05/2016 1223   AST 34 09/10/2015 1054   ALT 22 03/19/2016 1031   ALT 24 03/05/2016 1223   ALT 13 09/10/2015 1054   BILITOT 0.45 03/19/2016 1031   BILITOT 0.4 03/05/2016 1223   BILITOT 0.80 09/10/2015 1054     Impression and Plan: Ms. Hails is a 82 year old female with a history of stage I adenocarcinoma of the right colon with resection in May 2008. She has done well since receiving blood in June and did follow-up with Dr. Henrene Pastor. We will continue to follow along closely with her and monitor her CBC and iron studies. He symptoms have improved. She is not feeling as fatigued and has no c/o dizziness.  Her Hgb is stable at 11.3 with an MCV of 93. We will see what her iron studies show and bring her back in next week for an infusion if needed.  We will plan to see her back in 1 month for repeat lab work and follow-up.  Both she and her family know to contact our office with any questions or concerns. We can certainly see her sooner if need be.   Eliezer Bottom, NP 7/7/20179:42 AM

## 2016-04-10 NOTE — Progress Notes (Signed)
No treatment needed today per Dr Marin Olp; patient discharged to home.

## 2016-04-10 NOTE — Telephone Encounter (Signed)
Called and left detailed message for patient's daughter [name] advising her that order has been placed for [facility] to go out and check on patient's concentrator to make sure it is functioning properly. Advised her to contact our office if she needs anything further.  Nothing further needed.

## 2016-04-10 NOTE — Telephone Encounter (Signed)
Late entry: called Monique Fleming and lm at the number listed below on 04/08/16 to inform her of information below.

## 2016-04-13 ENCOUNTER — Ambulatory Visit: Payer: Medicare Other

## 2016-04-13 ENCOUNTER — Ambulatory Visit: Payer: Medicare Other | Admitting: Hematology & Oncology

## 2016-04-13 ENCOUNTER — Other Ambulatory Visit: Payer: Medicare Other

## 2016-04-13 ENCOUNTER — Encounter: Payer: Self-pay | Admitting: *Deleted

## 2016-04-14 ENCOUNTER — Ambulatory Visit (INDEPENDENT_AMBULATORY_CARE_PROVIDER_SITE_OTHER): Payer: Medicare Other | Admitting: Family Medicine

## 2016-04-14 ENCOUNTER — Encounter: Payer: Self-pay | Admitting: Family Medicine

## 2016-04-14 VITALS — BP 122/48 | HR 86 | Temp 98.2°F | Ht 65.0 in | Wt 123.1 lb

## 2016-04-14 DIAGNOSIS — I639 Cerebral infarction, unspecified: Secondary | ICD-10-CM | POA: Diagnosis not present

## 2016-04-14 DIAGNOSIS — E785 Hyperlipidemia, unspecified: Secondary | ICD-10-CM

## 2016-04-14 DIAGNOSIS — IMO0001 Reserved for inherently not codable concepts without codable children: Secondary | ICD-10-CM

## 2016-04-14 DIAGNOSIS — R040 Epistaxis: Secondary | ICD-10-CM | POA: Diagnosis not present

## 2016-04-14 DIAGNOSIS — E612 Magnesium deficiency: Secondary | ICD-10-CM

## 2016-04-14 DIAGNOSIS — E1159 Type 2 diabetes mellitus with other circulatory complications: Secondary | ICD-10-CM

## 2016-04-14 DIAGNOSIS — K7460 Unspecified cirrhosis of liver: Secondary | ICD-10-CM

## 2016-04-14 DIAGNOSIS — R7989 Other specified abnormal findings of blood chemistry: Secondary | ICD-10-CM

## 2016-04-14 DIAGNOSIS — R030 Elevated blood-pressure reading, without diagnosis of hypertension: Secondary | ICD-10-CM | POA: Diagnosis not present

## 2016-04-14 DIAGNOSIS — J449 Chronic obstructive pulmonary disease, unspecified: Secondary | ICD-10-CM

## 2016-04-14 HISTORY — DX: Epistaxis: R04.0

## 2016-04-14 MED ORDER — TRAMADOL HCL 50 MG PO TABS: 100.0000 mg | ORAL_TABLET | Freq: Two times a day (BID) | ORAL | Status: AC | PRN

## 2016-04-14 MED ORDER — MUPIROCIN 2 % EX OINT: 1.0000 "application " | TOPICAL_OINTMENT | Freq: Every day | CUTANEOUS | Status: DC

## 2016-04-14 NOTE — Patient Instructions (Signed)
Liver Failure Liver failure is a condition in which the liver loses its ability to function due to injury or disease. The liver is a large organ in the upper right-hand side of the abdomen. It is involved in many important functions, including storing energy, producing fluids that the body needs, and removing harmful substances from the bloodstream. Liver failure can develop quickly, over days or weeks (acute liver failure). It can also develop gradually, over months or years (chronicliver failure).  CAUSES There are many possible causes of liver failure, including:  Liver infection (viral hepatitis).  Alcohol abuse.  An overdose of certain medicines. Acetaminophen overdose is a common cause.  Liver disease.  Ingesting poison from mushrooms or mold. SYMPTOMS  Symptoms of this condition may include:  Yellowing of the skin and the whites of the eyes (jaundice).  Bruising easily.  Persistent bleeding.  Itchy skin (pruritus).  Red, spider-like lines on the skin.  Loss of appetite.  Nausea.  Vomiting blood.  Bloody bowel movements.  Weight loss.  Muscle loss.  Jerky or floppy muscle movements, especially with hand movements (asterixis).  Fluid buildup in the belly (ascites).  Decreased urination. This may be a sign that your kidneys have stopped working (renal failure).  Confusion and sleepiness.  Difficulty sleeping.  Mood and personality changes.  Frequent infections.  Breath that smells sweet or musty.  Difficulty breathing. DIAGNOSIS  This condition is diagnosed based on your symptoms, your medical history, and a physical exam. You may have tests, including:  Blood tests.  CT scan.  MRI.  Ultrasound.  Removal of a small amount of liver tissue to be examined under a microscope (biopsy). TREATMENT  Treatment depends on the cause and severity of your condition. Treatment for chronic liver failure may include:  Medicines to help reduce  symptoms.  Lifestyle changes, such as limiting salt and animal proteins in your diet. Foods that contain animal proteins include red meat, fish, and dairy products. Acute or advanced (end stage) liver failure may require hospitalization. Treatment may include:  Antibiotic medicine.  IV fluids that contain sugar (glucose) and minerals (electrolytes).  Flushing out toxic substances from the body using medicine (lactulose) or a cleansing procedure (enema).  Adding certain amounts of the liquid part of blood (plasma) to your bloodstream (receiving a transfusion).  Using an artificial kidney to filter your blood (hemodialysis) if you have renal failure.  Breathing support and a breathing tube (respirator).  Liver transplant. This is a surgery to replace your liver with another person's liver (donor liver). This may be the best option if your liver has completely stopped functioning. HOME CARE INSTRUCTIONS  Take over-the-counter and prescription medicines only as told by your health care provider.  Do not drink alcohol.  Do not use tobacco products, including cigarettes, chewing tobacco, or e-cigarettes. If you need help quitting, ask your health care provider.  Follow instructions from your health care provider about eating and drinking restrictions. This may include:  Limiting the amount of animal protein that you eat.  Increasing the amount of plant-based protein that you eat. Foods that contain plant-based proteins include whole grains, nuts, and vegetables.  Taking vitamin supplements.  Limiting the amount of salt that you eat.  Follow instructions from your health care provider about maintaining your vaccinations, especially vaccinations against hepatitis A and B.  Exercise regularly, as told by your health care provider.  Keep all follow-up visits as told by your health care provider. This is important. SEEK MEDICAL CARE IF:  You have symptoms that get worse.  You lose a  lot of weight without trying.  You have a fever or chills. SEEK IMMEDIATE MEDICAL CARE IF:  You become confused or very sleepy.  You cannot take care of yourself or be taken care of at home.  You are not urinating.  You have difficulty breathing.  You vomit blood.   This information is not intended to replace advice given to you by your health care provider. Make sure you discuss any questions you have with your health care provider.   Document Released: 06/12/2015 Document Reviewed: 10/10/2014 Elsevier Interactive Patient Education [employer].

## 2016-04-14 NOTE — Assessment & Plan Note (Signed)
Well controlled, no changes to meds. Encouraged heart healthy diet such as the DASH diet and exercise as tolerated.  °

## 2016-04-14 NOTE — Progress Notes (Signed)
Pre visit review using our clinic review tool, if applicable. No additional management support is needed unless otherwise documented below in the visit note. 

## 2016-04-16 ENCOUNTER — Telehealth: Payer: Self-pay

## 2016-04-16 ENCOUNTER — Telehealth: Payer: Self-pay | Admitting: Neurology

## 2016-04-16 MED ORDER — PHENYTOIN SODIUM EXTENDED 100 MG PO CAPS: 200.0000 mg | ORAL_CAPSULE | Freq: Every day | ORAL | Status: DC

## 2016-04-16 MED ORDER — PHENYTOIN SODIUM EXTENDED 100 MG PO CAPS: 200.0000 mg | ORAL_CAPSULE | Freq: Every day | ORAL | Status: AC

## 2016-04-16 NOTE — Telephone Encounter (Signed)
[facility]ilantin sent to [facility]

## 2016-04-16 NOTE — Telephone Encounter (Signed)
Pt's daughter called [facility]s [facility] is requesting phenytoin (DILANTIN) 100 MG ER capsule to be [facility] [place] and a 90 day supply.  RN needs to call only if there is a problem.

## 2016-04-17 NOTE — Telephone Encounter (Signed)
Kim/[facility] [phone] called requesting 90 day supply.  REF# [id number]

## 2016-04-17 NOTE — Telephone Encounter (Signed)
Rn call [facility] back and they have the 180 tablets for 90 day supply.

## 2016-04-20 ENCOUNTER — Ambulatory Visit: Payer: Medicare Other | Admitting: Internal Medicine

## 2016-04-23 ENCOUNTER — Encounter: Payer: Self-pay | Admitting: Family Medicine

## 2016-04-23 ENCOUNTER — Ambulatory Visit: Payer: Medicare Other | Admitting: Neurology

## 2016-04-24 ENCOUNTER — Telehealth: Payer: Self-pay

## 2016-04-24 NOTE — Telephone Encounter (Signed)
Received clarification orders from [facility] via fax.  Form was forwarded to provider for review and signature.  Form reviewed and signed by provider and faxed back to facility at [phone].  Fax confirmation received.  Numbered formed and placed in scan bin for scanning.

## 2016-04-26 NOTE — Assessment & Plan Note (Addendum)
Doing well at this time. No changes, does note some occasional wheezing

## 2016-04-26 NOTE — Assessment & Plan Note (Signed)
With elevated but stable ammonia levels. Avoid offending foods and continue Xifaxan and Lactulose

## 2016-04-26 NOTE — Assessment & Plan Note (Signed)
hgba1c acceptable, minimize simple carbs. Increase exercise as tolerated.  

## 2016-04-26 NOTE — Assessment & Plan Note (Signed)
2 episodes controllable, use Mupirocin ointment to nares if no improvement will need referral to ENT

## 2016-04-26 NOTE — Assessment & Plan Note (Signed)
Encouraged heart healthy diet, increase exercise, avoid trans fats, consider a krill oil cap daily 

## 2016-04-26 NOTE — Progress Notes (Signed)
Patient ID: NATALEIGH GRIFFIN, female   DOB: 1928/10/06, 82 y.o.   MRN: [id number]   Subjective:    Patient ID: Bennie Hind, female    DOB: 21-Sep-1929, 82 y.o.   MRN: [id number]  Chief Complaint  Patient presents with  . Follow-up    HPI Patient is in today for follow up. Is here today with her daughter. She has had 2 nosebleeds in past week but they have been controllable. No nasal congestion or fevers. Is noting SOB with wheezing but it is mild and not affecting her activity much. Tolerating Xifaxin and Lactulose. Denies CP/palp/HA/congestion/fevers/GI or GU c/o. Taking meds as prescribed  Past Medical History:  Diagnosis Date  . Abnormal liver function test   . Adenocarcinoma ([place]) 2008   colon,s/p right hemicolectomy  . Anemia    iron deficiency  . Angina   . Anxiety    Dr Casimiro Needle  . Aortic stenosis    moderate by echo 6/11 gradient 20/46mHg for mean and peak  . Asthma   . Atherosclerosis   . Bursitis    spine per pt  . Cellulitis   . Chronic bronchitis   . Cirrhosis of liver (HCC)    hx of  . Clostridium difficile diarrhea   . COPD (chronic obstructive pulmonary disease) ([place]   . Depression   . Diabetes mellitus    type II  . Diverticular disease   . Elevated BP 07/09/2015  . Encephalopathy, metabolic   . Epistaxis 04/14/2016  . GERD (gastroesophageal reflux disease)   . IBS (irritable bowel syndrome)   . Infectious diarrhea(009.2)   . Insomnia    chronic  . Internal hemorrhoids   . Macular degeneration 02/18/2014   [facility] . Peripheral vascular disease ([place]   . Protein calorie malnutrition ([place]   . Renal cyst   . Seizures (HCC)    hx of one seizure "  . Sinusitis   . Spinal stenosis 03/29/2016  . Thrombocytopenia ([place] 04/01/2013  . Thrush   . TIA (transient ischemic attack)    Dr. SLeonie Man . Type 2 diabetes mellitus with vascular disease ([place] 03/02/2007   Chronic      Past Surgical History:  Procedure Laterality Date  .  APPENDECTOMY    . CAROTID ENDARTERECTOMY    . CHOLECYSTECTOMY  11/09  . COLECTOMY     right hemi  . TONSILLECTOMY    . UPPER GASTROINTESTINAL ENDOSCOPY      Family History  Problem Relation Age of Onset  . Colon cancer Paternal Grandmother   . Esophageal cancer Brother   . Heart disease Mother   . Diabetes Brother   . Hypertension Father   . Rectal cancer Neg Hx   . Stomach cancer Neg Hx     Social History   Social History  . Marital status: Widowed    Spouse name: N/A  . Number of children: 3  . Years of education: N/A   Occupational History  . retired   .  Retired   Social History Main Topics  . Smoking status: Former Smoker    Packs/day: 0.50    Years: 70.00    Types: Cigarettes    Start date: 02/14/1951    Quit date: 01/25/2016  . Smokeless tobacco: Never Used     Comment: 01/2016 Quit  . Alcohol use No  . Drug use: No  . Sexual activity: No   Other Topics Concern  . Not on file  Social History Narrative   Patient signed a Environmental consultant to allow her daughter, Queena Monrreal, to have access to her medical records/information. Entered by Fleet Contras March 24,2011 @ 2:47 pm   Dailly Caffeine Use    Outpatient Medications Prior to Visit  Medication Sig Dispense Refill  . albuterol (PROAIR HFA) 108 (90 BASE) MCG/ACT inhaler Inhale 2 puffs into the lungs every 4 (four) hours as needed for wheezing or shortness of breath. 3 Inhaler 2  . cholestyramine (QUESTRAN) 4 g packet MIX 1 PACKET (4 GRAM TOTAL) IN LIQUID AND DRINK DAILY AS NEEDED FOR DIARRHEA 180 each 0  . clonazePAM (KLONOPIN) 1 MG tablet Take 1 tablet (1 mg total) by mouth 2 (two) times daily as needed for anxiety. (Patient taking differently: Take 0.5-1 mg by mouth 2 (two) times daily as needed for anxiety. Can take 0.5 tablet during the day if needed. She rarely takes it per daughter) 15 tablet 0  . clopidogrel (PLAVIX) 75 MG tablet TAKE 1 TABLET DAILY 90 tablet 2  . desvenlafaxine (PRISTIQ)  100 MG 24 hr tablet Take 100 mg by mouth daily.    . fluticasone (FLONASE) 50 MCG/ACT nasal spray USE 2 SPRAYS IN EACH NOSTRIL DAILY AS NEEDED FOR ALLERGIES 48 g 2  . formoterol (PERFOROMIST) 20 MCG/2ML nebulizer solution Take 2 mLs (20 mcg total) by nebulization 2 (two) times daily. (Patient taking differently: Take 20 mcg by nebulization 2 (two) times daily as needed. For sob & wheezing) 120 mL 5  . glucose blood (BAYER CONTOUR NEXT TEST) test strip Use as directed once daily to check blood sugar.  DX E11.9 100 each 12  . lactulose (CHRONULAC) 10 GM/15ML solution Take 45 ml's 3 times daily. 4050 mL 1  . Magnesium 400 MG TABS Take 400 mg by mouth 2 (two) times daily. 180 tablet 0  . metFORMIN (GLUCOPHAGE) 500 MG tablet TAKE ONE-HALF (1/2) TO ONE TABLET DAILY WITH BREAKFAST 90 tablet 1  . montelukast (SINGULAIR) 10 MG tablet Take 1 tablet (10 mg total) by mouth at bedtime. 30 tablet 2  . Multiple Vitamins-Minerals (OCUVITE PRESERVISION) TABS Take by mouth 2 (two) times daily.    . Nutritional Supplements (CARNATION INSTANT BREAKFAST PO) Take 1 each by mouth 3 (three) times daily.    . ondansetron (ZOFRAN) 4 MG tablet Take 1 tablet (4 mg total) by mouth every 6 (six) hours as needed for nausea or vomiting. 60 tablet 0  . Polyethyl Glycol-Propyl Glycol (SYSTANE) 0.4-0.3 % SOLN Place 1 drop into both eyes 2 (two) times daily as needed (dry eyes).     . potassium chloride (K-DUR) 10 MEQ tablet Take 1 tablet (10 mEq total) by mouth 2 (two) times daily. 60 tablet 3  . ranitidine (ZANTAC) 300 MG tablet Take 1 tablet daily. 90 tablet 3  . saccharomyces boulardii (FLORASTOR) 250 MG capsule Take 250 mg by mouth daily.    . sodium chloride (OCEAN) 0.65 % SOLN nasal spray Place 2 sprays into both nostrils daily as needed for congestion.     . phenytoin (DILANTIN) 100 MG ER capsule Take 2 capsules (200 mg total) by mouth at bedtime. Reported on 02/13/2016 60 capsule 2  . rifaximin (XIFAXAN) 200 MG tablet Take 1  tablet (200 mg total) by mouth 3 (three) times daily. 42 tablet 0  . traMADol (ULTRAM) 50 MG tablet Take 1 tablet (50 mg total) by mouth every 12 (twelve) hours as needed for severe pain. 30 tablet 0   No  facility-administered medications prior to visit.     Allergies  Allergen Reactions  . Bactrim [Sulfamethoxazole-Trimethoprim] Shortness Of Breath  . Levofloxacin Other (See Comments)    seizure  . Pneumovax [Pneumococcal Polysaccharide Vaccine] Other (See Comments)    Severe allergy-cellulitis  . Amoxicillin-Pot Clavulanate Other (See Comments)    Patient stated Drs. recommend taking Augmentin due to her PCN allergy.  . Penicillins Rash    Has patient had a PCN reaction causing immediate rash, facial/tongue/throat swelling, SOB or lightheadedness with hypotension: No Has patient had a PCN reaction causing severe rash involving mucus membranes or skin necrosis: No Has patient had a PCN reaction that required hospitalization No Has patient had a PCN reaction occurring within the last 10 years: No If all of the above answers are "NO", then may proceed with Cephalosporin use.   . Bupropion   . Haldol [Haloperidol Lactate]     Had unknown reaction many yrs ago per pt  . Haloperidol   . Morphine   . Morphine And Related     Confused and agitation  . Pneumococcal Vaccine   . Pseudoephedrine Hcl   . Sudafed [Pseudoephedrine Hcl] Other (See Comments)    seizure    Review of Systems  Constitutional: Positive for malaise/fatigue. Negative for fever.  HENT: Negative for congestion.   Eyes: Negative for blurred vision.  Respiratory: Positive for shortness of breath.   Cardiovascular: Negative for chest pain, palpitations and leg swelling.  Gastrointestinal: Negative for abdominal pain, blood in stool and nausea.  Genitourinary: Negative for dysuria and frequency.  Musculoskeletal: Positive for myalgias. Negative for falls.  Skin: Negative for rash.  Neurological: Positive for  tremors. Negative for dizziness, loss of consciousness and headaches.  Endo/Heme/Allergies: Negative for environmental allergies.  Psychiatric/Behavioral: Negative for depression. The patient is nervous/anxious.        Objective:    Physical Exam  Constitutional: She is oriented to person, place, and time. She appears well-developed and well-nourished. No distress.  HENT:  Head: Normocephalic and atraumatic.  Nose: Nose normal.  Eyes: Right eye exhibits no discharge. Left eye exhibits no discharge.  Neck: Normal range of motion. Neck supple.  Cardiovascular: Normal rate and regular rhythm.   Murmur heard. Pulmonary/Chest: Effort normal and breath sounds normal.  Abdominal: Soft. Bowel sounds are normal. There is no tenderness.  Musculoskeletal: She exhibits no edema.  Neurological: She is alert and oriented to person, place, and time.  Skin: Skin is warm and dry.  Psychiatric: She has a normal mood and affect.  Nursing note and vitals reviewed.   BP (!) 122/48   Pulse 86   Temp 98.2 F (36.8 C) (Oral)   Ht 5\' 5"  (1.651 m)   Wt 123 lb 2 oz (55.8 kg)   SpO2 96%   BMI 20.49 kg/m  Wt Readings from Last 3 Encounters:  04/14/16 123 lb 2 oz (55.8 kg)  04/10/16 122 lb (55.3 kg)  03/26/16 123 lb (55.8 kg)     Lab Results  Component Value Date   WBC 5.5 04/10/2016   HGB 11.3 (L) 04/10/2016   HCT 34.1 (L) 04/10/2016   PLT 116 (L) 04/10/2016   GLUCOSE 179 (H) 04/10/2016   CHOL 129 03/05/2016   TRIG 136.0 03/05/2016   HDL 33.10 (L) 03/05/2016   LDLDIRECT 124.3 03/20/2010   LDLCALC 68 03/05/2016   ALT 26 04/10/2016   AST 51 (H) 04/10/2016   NA 140 04/10/2016   K 4.7 04/10/2016   CL 104 03/05/2016  CREATININE 0.9 04/10/2016   BUN 13.6 04/10/2016   CO2 26 04/10/2016   TSH 1.36 03/05/2016   INR 1.3 (H) 03/05/2016   HGBA1C 5.1 03/05/2016    Lab Results  Component Value Date   TSH 1.36 03/05/2016   Lab Results  Component Value Date   WBC 5.5 04/10/2016   HGB  11.3 (L) 04/10/2016   HCT 34.1 (L) 04/10/2016   MCV 93 04/10/2016   PLT 116 (L) 04/10/2016   Lab Results  Component Value Date   NA 140 04/10/2016   K 4.7 04/10/2016   CHLORIDE 106 04/10/2016   CO2 26 04/10/2016   GLUCOSE 179 (H) 04/10/2016   BUN 13.6 04/10/2016   CREATININE 0.9 04/10/2016   BILITOT 0.48 04/10/2016   ALKPHOS 138 04/10/2016   AST 51 (H) 04/10/2016   ALT 26 04/10/2016   PROT 6.8 04/10/2016   ALBUMIN 2.9 (L) 04/10/2016   CALCIUM 9.1 04/10/2016   ANIONGAP 8 04/10/2016   EGFR 63 (L) 04/10/2016   GFR 63.32 03/05/2016   Lab Results  Component Value Date   CHOL 129 03/05/2016   Lab Results  Component Value Date   HDL 33.10 (L) 03/05/2016   Lab Results  Component Value Date   LDLCALC 68 03/05/2016   Lab Results  Component Value Date   TRIG 136.0 03/05/2016   Lab Results  Component Value Date   CHOLHDL 4 03/05/2016   Lab Results  Component Value Date   HGBA1C 5.1 03/05/2016       Assessment & Plan:   Problem List Items Addressed This Visit    Type 2 diabetes mellitus with vascular disease ([place])    hgba1c acceptable, minimize simple carbs. Increase exercise as tolerated.       COPD with chronic bronchitis ([place])    Doing well at this time. No changes, does note some occasional wheezing       Hyperlipidemia    Encouraged heart healthy diet, increase exercise, avoid trans fats, consider a krill oil cap daily      Elevated BP    Well controlled, no changes to meds. Encouraged heart healthy diet such as the DASH diet and exercise as tolerated.       Relevant Orders   Magnesium   Comp Met (CMET)   Ammonia   Hepatic cirrhosis (HCC)    With elevated but stable ammonia levels. Avoid offending foods and continue Xifaxan and Lactulose      Epistaxis - Primary    2 episodes controllable, use Mupirocin ointment to nares if no improvement will need referral to ENT      Relevant Medications   mupirocin ointment (BACTROBAN) 2 %   Other  Relevant Orders   Magnesium   Comp Met (CMET)   Ammonia    Other Visit Diagnoses    Increased ammonia level       Relevant Orders   Magnesium   Comp Met (CMET)   Ammonia   Magnesium deficiency       Relevant Orders   Magnesium   Comp Met (CMET)   Ammonia      I have changed Ms. Tal's traMADol. I am also having her start on mupirocin ointment. Additionally, I am having her maintain her OCUVITE PRESERVISION, saccharomyces boulardii, Polyethyl Glycol-Propyl Glycol, sodium chloride, albuterol, formoterol, desvenlafaxine, ondansetron, fluticasone, clopidogrel, cholestyramine, clonazePAM, glucose blood, Nutritional Supplements (CARNATION INSTANT BREAKFAST PO), Magnesium, potassium chloride, ranitidine, lactulose, metFORMIN, montelukast, and rifaximin.  Meds ordered this encounter  Medications  . rifaximin (  XIFAXAN) 550 MG TABS tablet    Sig: Take 550 mg by mouth 2 (two) times daily.  . traMADol (ULTRAM) 50 MG tablet    Sig: Take 2 tablets (100 mg total) by mouth every 12 (twelve) hours as needed for severe pain.    Dispense:  60 tablet    Refill:  0  . mupirocin ointment (BACTROBAN) 2 %    Sig: Place 1 application into the nose daily. Apply to nares via clean qtip    Dispense:  22 g    Refill:  1     Mardy Hoppe, MD

## 2016-04-30 ENCOUNTER — Ambulatory Visit: Payer: Medicare Other | Admitting: Pulmonary Disease

## 2016-05-01 ENCOUNTER — Ambulatory Visit (HOSPITAL_BASED_OUTPATIENT_CLINIC_OR_DEPARTMENT_OTHER)
Admission: RE | Admit: 2016-05-01 | Discharge: 2016-05-01 | Disposition: A | Discharge status: Home / Self Care | Payer: Medicare Other | Source: Ambulatory Visit | Attending: Medical | Admitting: Medical

## 2016-05-01 ENCOUNTER — Encounter: Payer: Self-pay | Admitting: Pulmonary Disease

## 2016-05-01 ENCOUNTER — Encounter: Payer: Self-pay | Admitting: Medical

## 2016-05-01 ENCOUNTER — Ambulatory Visit (INDEPENDENT_AMBULATORY_CARE_PROVIDER_SITE_OTHER): Payer: Medicare Other | Admitting: Medical

## 2016-05-01 VITALS — BP 100/58 | HR 85 | Temp 98.2°F | Ht 65.0 in | Wt 122.2 lb

## 2016-05-01 DIAGNOSIS — R05 Cough: Secondary | ICD-10-CM | POA: Insufficient documentation

## 2016-05-01 DIAGNOSIS — M545 Low back pain: Secondary | ICD-10-CM

## 2016-05-01 DIAGNOSIS — R059 Cough, unspecified: Secondary | ICD-10-CM

## 2016-05-01 DIAGNOSIS — R109 Unspecified abdominal pain: Secondary | ICD-10-CM

## 2016-05-01 DIAGNOSIS — R5383 Other fatigue: Secondary | ICD-10-CM

## 2016-05-01 DIAGNOSIS — R918 Other nonspecific abnormal finding of lung field: Secondary | ICD-10-CM | POA: Diagnosis not present

## 2016-05-01 DIAGNOSIS — R350 Frequency of micturition: Secondary | ICD-10-CM | POA: Diagnosis not present

## 2016-05-01 DIAGNOSIS — I639 Cerebral infarction, unspecified: Secondary | ICD-10-CM

## 2016-05-01 LAB — POC URINALSYSI DIPSTICK (AUTOMATED)
BILIRUBIN UA: NEGATIVE
GLUCOSE UA: NEGATIVE
Ketones, UA: NEGATIVE
LEUKOCYTES UA: NEGATIVE
NITRITE UA: NEGATIVE
RBC UA: NEGATIVE
Spec Grav, UA: >=1.03
UROBILINOGEN UA: 0.2
pH, UA: 6

## 2016-05-01 LAB — COMPREHENSIVE METABOLIC PANEL
ALBUMIN: 3.2 g/dL — AB (ref 3.5–5.2)
ALT: 21 U/L (ref 0–35)
AST: 41 U/L — AB (ref 0–37)
Alkaline Phosphatase: 116 U/L (ref 39–117)
BILIRUBIN TOTAL: 0.4 mg/dL (ref 0.2–1.2)
BUN: 14 mg/dL (ref 6–23)
CALCIUM: 8.8 mg/dL (ref 8.4–10.5)
CO2: 26 mEq/L (ref 19–32)
CREATININE: 0.85 mg/dL (ref 0.40–1.20)
Chloride: 104 mEq/L (ref 96–112)
GFR: 67.61 mL/min (ref >60.00)
Glucose, Bld: 221 mg/dL — ABNORMAL HIGH (ref 70–99)
Potassium: 4.3 mEq/L (ref 3.5–5.1)
Sodium: 136 mEq/L (ref 135–145)
Total Protein: 6.7 g/dL (ref 6.0–8.3)

## 2016-05-01 LAB — CBC WITH DIFFERENTIAL/PLATELET
BASOS PCT: 0.3 % (ref 0.0–3.0)
Basophils Absolute: 0 10*3/uL (ref 0.0–0.1)
EOS ABS: 0.3 10*3/uL (ref 0.0–0.7)
Eosinophils Relative: 4.2 % (ref 0.0–5.0)
HEMATOCRIT: 30.3 % — AB (ref 36.0–46.0)
HEMOGLOBIN: 10.1 g/dL — AB (ref 12.0–15.0)
LYMPHS PCT: 16.8 % (ref 12.0–46.0)
Lymphs Abs: 1.1 10*3/uL (ref 0.7–4.0)
MCHC: 33.2 g/dL (ref 30.0–36.0)
MCV: 87.3 fl (ref 78.0–100.0)
MONOS PCT: 11.9 % (ref 3.0–12.0)
Monocytes Absolute: 0.8 10*3/uL (ref 0.1–1.0)
NEUTROS ABS: 4.2 10*3/uL (ref 1.4–7.7)
Neutrophils Relative %: 66.8 % (ref 43.0–77.0)
PLATELETS: 111 10*3/uL — AB (ref 150.0–400.0)
RBC: 3.47 Mil/uL — ABNORMAL LOW (ref 3.87–5.11)
RDW: 15.4 % (ref 11.5–15.5)
WBC: 6.3 10*3/uL (ref 4.0–10.5)

## 2016-05-01 LAB — FERRITIN: FERRITIN: 20.5 ng/mL (ref 10.0–291.0)

## 2016-05-01 MED ORDER — FLUTICASONE PROPIONATE HFA 110 MCG/ACT IN AERO: 2.0000 | INHALATION_SPRAY | Freq: Two times a day (BID) | RESPIRATORY_TRACT | 3 refills | Status: AC

## 2016-05-01 MED ORDER — SULFAMETHOXAZOLE-TRIMETHOPRIM 800-160 MG PO TABS: 1.0000 | ORAL_TABLET | Freq: Two times a day (BID) | ORAL | 0 refills | Status: DC

## 2016-05-01 MED ORDER — NITROFURANTOIN MONOHYD MACRO 100 MG PO CAPS: 100.0000 mg | ORAL_CAPSULE | Freq: Two times a day (BID) | ORAL | 0 refills | Status: DC

## 2016-05-01 NOTE — Progress Notes (Signed)
Subjective:    Patient ID: Monique Fleming, female    DOB: 11-06-1928, 82 y.o.   MRN: [id number]  HPI  Pt in for some lower rt side back pain. She thinks may be kidney pain. Some chillls. No pain when she urinates. Pt thinks urinating more frequent. Pt has some hx of infection in the past. No vomiting. But mild nauseau. All above for a week. Some fever and chills. Decreased energy.   Pt also states occasional breathing problems. Hx of copd. No wheezing that pt can hear. Pt thinks she may have lung infection. Bronchtis or pneumonia. Some cough today on exam.  Pt daugher sent message to [place] concern for C. Dif. Saying no antibiotic without urine culture or temp above 101. If to give then bactrim. This is infectious disease opinion per daughter.     Review of Systems  Constitutional: Positive for chills, fatigue and fever.       Subjective  HENT: Negative for congestion, ear pain, sinus pressure, sneezing and sore throat.   Respiratory: Positive for cough and shortness of breath. Negative for wheezing.        Copd history.  Cardiovascular: Negative for chest pain and palpitations.  Gastrointestinal: Negative for abdominal pain.  Musculoskeletal: Positive for back pain.       Reported but none on exam.  Neurological: Negative for dizziness and headaches.  Hematological: Negative for adenopathy. Does not bruise/bleed easily.  Psychiatric/Behavioral: Negative for behavioral problems and confusion.   Past Medical History:  Diagnosis Date  . Abnormal liver function test   . Adenocarcinoma ([place]) 2008   colon,s/p right hemicolectomy  . Anemia    iron deficiency  . Angina   . Anxiety    Dr Casimiro Needle  . Aortic stenosis    moderate by echo 6/11 gradient 20/85mmHg for mean and peak  . Asthma   . Atherosclerosis   . Bursitis    spine per pt  . Cellulitis   . Chronic bronchitis   . Cirrhosis of liver (HCC)    hx of  . Clostridium difficile diarrhea   . COPD (chronic obstructive  pulmonary disease) ([place])   . Depression   . Diabetes mellitus    type II  . Diverticular disease   . Elevated BP 07/09/2015  . Encephalopathy, metabolic   . Epistaxis 04/14/2016  . GERD (gastroesophageal reflux disease)   . IBS (irritable bowel syndrome)   . Infectious diarrhea(009.2)   . Insomnia    chronic  . Internal hemorrhoids   . Macular degeneration 02/18/2014   Follows with [facility]  . Peripheral vascular disease ([place])   . Protein calorie malnutrition ([place])   . Renal cyst   . Seizures (HCC)    hx of one seizure "  . Sinusitis   . Spinal stenosis 03/29/2016  . Thrombocytopenia ([place]) 04/01/2013  . Thrush   . TIA (transient ischemic attack)    Dr. [name]  . Type 2 diabetes mellitus with vascular disease ([place]) 03/02/2007   Chronic       Social History   Social History  . Marital status: Widowed    Spouse name: N/A  . Number of children: 3  . Years of education: N/A   Occupational History  . retired   .  Retired   Social History Main Topics  . Smoking status: Former Smoker    Packs/day: 0.50    Years: 70.00    Types: Cigarettes    Start date: 02/14/1951  Quit date: 01/25/2016  . Smokeless tobacco: Never Used     Comment: 01/2016 Quit  . Alcohol use No  . Drug use: No  . Sexual activity: No   Other Topics Concern  . Not on file   Social History Narrative   Patient signed a Designated Party Release to allow her daughter, Xian Deupree, to have access to her medical records/information. Entered by Fleet Contras March 24,2011 @ 2:47 pm   Dailly Caffeine Use    Past Surgical History:  Procedure Laterality Date  . APPENDECTOMY    . CAROTID ENDARTERECTOMY    . CHOLECYSTECTOMY  11/09  . COLECTOMY     right hemi  . TONSILLECTOMY    . UPPER GASTROINTESTINAL ENDOSCOPY      Family History  Problem Relation Age of Onset  . Colon cancer Paternal Grandmother   . Esophageal cancer Brother   . Heart disease Mother   . Diabetes Brother   . Hypertension Father    . Rectal cancer Neg Hx   . Stomach cancer Neg Hx     Allergies  Allergen Reactions  . Bactrim [Sulfamethoxazole-Trimethoprim] Shortness Of Breath  . Levofloxacin Other (See Comments)    seizure  . Pneumovax [Pneumococcal Polysaccharide Vaccine] Other (See Comments)    Severe allergy-cellulitis  . Amoxicillin-Pot Clavulanate Other (See Comments)    Patient stated Drs. recommend taking Augmentin due to her PCN allergy.  . Penicillins Rash    Has patient had a PCN reaction causing immediate rash, facial/tongue/throat swelling, SOB or lightheadedness with hypotension: No Has patient had a PCN reaction causing severe rash involving mucus membranes or skin necrosis: No Has patient had a PCN reaction that required hospitalization No Has patient had a PCN reaction occurring within the last 10 years: No If all of the above answers are "NO", then may proceed with Cephalosporin use.   . Bupropion   . Haldol [Haloperidol Lactate]     Had unknown reaction many yrs ago per pt  . Haloperidol   . Morphine   . Morphine And Related     Confused and agitation  . Pneumococcal Vaccine   . Pseudoephedrine Hcl   . Sudafed [Pseudoephedrine Hcl] Other (See Comments)    seizure    Current Outpatient Prescriptions on File Prior to Visit  Medication Sig Dispense Refill  . albuterol (PROAIR HFA) 108 (90 BASE) MCG/ACT inhaler Inhale 2 puffs into the lungs every 4 (four) hours as needed for wheezing or shortness of breath. 3 Inhaler 2  . cholestyramine (QUESTRAN) 4 g packet MIX 1 PACKET (4 GRAM TOTAL) IN LIQUID AND DRINK DAILY AS NEEDED FOR DIARRHEA 180 each 0  . clonazePAM (KLONOPIN) 1 MG tablet Take 1 tablet (1 mg total) by mouth 2 (two) times daily as needed for anxiety. (Patient taking differently: Take 0.5-1 mg by mouth 2 (two) times daily as needed for anxiety. Can take 0.5 tablet during the day if needed. She rarely takes it per daughter) 15 tablet 0  . clopidogrel (PLAVIX) 75 MG tablet TAKE 1  TABLET DAILY 90 tablet 2  . desvenlafaxine (PRISTIQ) 100 MG 24 hr tablet Take 100 mg by mouth daily.    . fluticasone (FLONASE) 50 MCG/ACT nasal spray USE 2 SPRAYS IN EACH NOSTRIL DAILY AS NEEDED FOR ALLERGIES 48 g 2  . formoterol (PERFOROMIST) 20 MCG/2ML nebulizer solution Take 2 mLs (20 mcg total) by nebulization 2 (two) times daily. (Patient taking differently: Take 20 mcg by nebulization 2 (two) times daily as needed.  For sob & wheezing) 120 mL 5  . glucose blood (BAYER CONTOUR NEXT TEST) test strip Use as directed once daily to check blood sugar.  DX E11.9 100 each 12  . lactulose (CHRONULAC) 10 GM/15ML solution Take 45 ml's 3 times daily. 4050 mL 1  . Magnesium 400 MG TABS Take 400 mg by mouth 2 (two) times daily. 180 tablet 0  . metFORMIN (GLUCOPHAGE) 500 MG tablet TAKE ONE-HALF (1/2) TO ONE TABLET DAILY WITH BREAKFAST 90 tablet 1  . montelukast (SINGULAIR) 10 MG tablet Take 1 tablet (10 mg total) by mouth at bedtime. 30 tablet 2  . Multiple Vitamins-Minerals (OCUVITE PRESERVISION) TABS Take by mouth 2 (two) times daily.    . mupirocin ointment (BACTROBAN) 2 % Place 1 application into the nose daily. Apply to nares via clean qtip 22 g 1  . Nutritional Supplements (CARNATION INSTANT BREAKFAST PO) Take 1 each by mouth 3 (three) times daily.    . ondansetron (ZOFRAN) 4 MG tablet Take 1 tablet (4 mg total) by mouth every 6 (six) hours as needed for nausea or vomiting. 60 tablet 0  . phenytoin (DILANTIN) 100 MG ER capsule Take 2 capsules (200 mg total) by mouth at bedtime. Reported on 02/13/2016 180 capsule 2  . Polyethyl Glycol-Propyl Glycol (SYSTANE) 0.4-0.3 % SOLN Place 1 drop into both eyes 2 (two) times daily as needed (dry eyes).     . potassium chloride (K-DUR) 10 MEQ tablet Take 1 tablet (10 mEq total) by mouth 2 (two) times daily. 60 tablet 3  . ranitidine (ZANTAC) 300 MG tablet Take 1 tablet daily. 90 tablet 3  . rifaximin (XIFAXAN) 550 MG TABS tablet Take 550 mg by mouth 2 (two) times  daily.    Marland Kitchen saccharomyces boulardii (FLORASTOR) 250 MG capsule Take 250 mg by mouth daily.    . sodium chloride (OCEAN) 0.65 % SOLN nasal spray Place 2 sprays into both nostrils daily as needed for congestion.     . traMADol (ULTRAM) 50 MG tablet Take 2 tablets (100 mg total) by mouth every 12 (twelve) hours as needed for severe pain. 60 tablet 0   No current facility-administered medications on file prior to visit.     BP (!) 100/58 (BP Location: Right Arm, Patient Position: Sitting, Cuff Size: Normal)   Pulse 85   Temp 98.2 F (36.8 C) (Oral)   Ht 5\' 5"  (1.651 m)   Wt 122 lb 3.2 oz (55.4 kg)   SpO2 96%   BMI 20.34 kg/m       Objective:   Physical Exam  General  Mental Status - Alert. General Appearance - Well groomed. Not in acute distress.  Skin Rashes- No Rashes.  HEENT Head- Normal.  Eye Sclera/Conjunctiva- Left- Normal. Right- Normal. Nose & Sinuses Nasal Mucosa- Left- not  Boggy and Congested. Right-  not Boggy and  Congested.Bilateral faint  maxillary but no  frontal sinus pressure. .  Neck Neck- Supple. No Masses.   Chest and Lung Exam Auscultation: Breath Sounds:-Clear even and unlabored.  Cardiovascular Auscultation:Rythm- Regular, rate and rhythm. Murmurs & Other Heart Sounds:Ausculatation of the heart reveal- No Murmurs.  Lymphatic Head & Neck General Head & Neck Lymphatics: Bilateral: Description- No Localized lymphadenopathy.    Abdomen Inspection:-Inspection Normal.  Palpation/Perucssion: Palpation and Percussion of the abdomen reveal- Non Tender, No Rebound tenderness, No rigidity(Guarding) and No Palpable abdominal masses.  Liver:-Normal.  Spleen:- Normal.   Faint- suprapubic pain.  Back- no cva tenderness  Assessment & Plan:  With your back pain and frequent urination will do a urine culture although ua dip only showed protein. However you appear to likely have infection.  Also you cough and reported chest congestion  causes concern for lung infection.  We area doing stat labs to assess your fatigue and cxr to see if you have bronchitis or pneumonia.  We will call you with those results.   The urine culture final result may take 3-4 days.  For early copd flare rx flovent and continue albuterol  I am providing bactrim ds rx. This is for fever over 101, worsening uti type symptoms or lung infection symptoms. I am aware of infectious disease guidelines daughter texted me. But we need to balance treating infections vs potential for c dif. If antibiotics are needed then start probiotics otc.  If you have any question on starting antibiotic this weekend then seek advisement from Dr. On call.   If worse signs and symptoms over weekend then ED.  Follow up in 5-7 days or as needed  See the caution guidelines on not filling bactrim. Rx was written.  Arnita Koons, Percell Miller, PA-C   Note pt daughter not in office and could not talk with her directly.

## 2016-05-01 NOTE — Patient Instructions (Addendum)
With your back pain and frequent urination will do a urine culture although ua dip only showed protein. However you appear to likely have infection.  Also you cough and reported chest congestion causes concern for lung infection.  We area doing stat labs to assess your fatigue and cxr to see if you have bronchitis or pneumonia.  We will call you with those results.   The urine culture final result may take 3-4 days.  For early copd flare rx flovent and continue albuterol  I am providing bactrim ds rx. This is for fever over 101, worsening uti type symptoms or lung infection symptoms. I am aware of infectious disease guidelines daughter texted me. But we need to balance treating infections vs potential for c dif. If antibiotics are needed then start probiotics otc.  I sent message to pharmacist to review allergy hx before dispensing bactrim. Also if you could get me name and number of infection disease MD so I can inquire regarding his strong recommendation to use bactrim only.  If you have any question on starting antibiotic this weekend then seek advisement from Dr. On call.   If worse signs and symptoms over weekend then ED.  Follow up in 5-7 days or as needed  Later talked with daughter. Decided not to give bactrim. cxr clear. No consolidation. GFR above 60 at this point more concerned for uti. Pt has limited choices based on allergy history, prolonged qt interval hx. If signs and symptoms worsen as discussed or urine culture + then advise start macrobid.

## 2016-05-01 NOTE — Telephone Encounter (Signed)
RA - please advise. Thanks! 

## 2016-05-01 NOTE — Progress Notes (Signed)
Pre visit review using our clinic review tool, if applicable. No additional management support is needed unless otherwise documented below in the visit note. 

## 2016-05-01 NOTE — Addendum Note (Signed)
Addended by: Anabel Halon on: 05/01/2016 05:20 PM   Modules accepted: Orders

## 2016-05-02 LAB — IRON AND TIBC
%SAT: 10 % — AB (ref 11–50)
Iron: 28 ug/dL — ABNORMAL LOW (ref 45–160)
TIBC: 275 ug/dL (ref 250–450)
UIBC: 247 ug/dL (ref 125–400)

## 2016-05-03 LAB — URINE CULTURE: Organism ID, Bacteria: 10000

## 2016-05-07 ENCOUNTER — Other Ambulatory Visit (HOSPITAL_BASED_OUTPATIENT_CLINIC_OR_DEPARTMENT_OTHER): Payer: Medicare Other

## 2016-05-07 ENCOUNTER — Ambulatory Visit (HOSPITAL_BASED_OUTPATIENT_CLINIC_OR_DEPARTMENT_OTHER): Payer: Medicare Other

## 2016-05-07 ENCOUNTER — Ambulatory Visit (HOSPITAL_BASED_OUTPATIENT_CLINIC_OR_DEPARTMENT_OTHER): Payer: Medicare Other | Admitting: Family

## 2016-05-07 ENCOUNTER — Encounter: Payer: Self-pay | Admitting: Family

## 2016-05-07 VITALS — BP 153/44 | HR 86 | Temp 97.6°F | Wt 120.0 lb

## 2016-05-07 DIAGNOSIS — Z85038 Personal history of other malignant neoplasm of large intestine: Secondary | ICD-10-CM

## 2016-05-07 DIAGNOSIS — D509 Iron deficiency anemia, unspecified: Secondary | ICD-10-CM

## 2016-05-07 DIAGNOSIS — D649 Anemia, unspecified: Secondary | ICD-10-CM

## 2016-05-07 DIAGNOSIS — K641 Second degree hemorrhoids: Secondary | ICD-10-CM

## 2016-05-07 DIAGNOSIS — Q2733 Arteriovenous malformation of digestive system vessel: Secondary | ICD-10-CM | POA: Diagnosis not present

## 2016-05-07 DIAGNOSIS — D500 Iron deficiency anemia secondary to blood loss (chronic): Secondary | ICD-10-CM

## 2016-05-07 LAB — CBC WITH DIFFERENTIAL (CANCER CENTER ONLY)
BASO#: 0 10*3/uL (ref 0.0–0.2)
BASO%: 0.6 % (ref 0.0–2.0)
EOS ABS: 0.2 10*3/uL (ref 0.0–0.5)
EOS%: 4.6 % (ref 0.0–7.0)
HCT: 30.5 % — ABNORMAL LOW (ref 34.8–46.6)
HGB: 9.7 g/dL — ABNORMAL LOW (ref 11.6–15.9)
LYMPH#: 0.8 10*3/uL — ABNORMAL LOW (ref 0.9–3.3)
LYMPH%: 15.9 % (ref 14.0–48.0)
MCH: 28.9 pg (ref 26.0–34.0)
MCHC: 31.8 g/dL — AB (ref 32.0–36.0)
MCV: 91 fL (ref 81–101)
MONO#: 0.6 10*3/uL (ref 0.1–0.9)
MONO%: 11 % (ref 0.0–13.0)
NEUT%: 67.9 % (ref 39.6–80.0)
NEUTROS ABS: 3.4 10*3/uL (ref 1.5–6.5)
PLATELETS: 115 10*3/uL — AB (ref 145–400)
RBC: 3.36 10*6/uL — ABNORMAL LOW (ref 3.70–5.32)
RDW: 14.2 % (ref 11.1–15.7)
WBC: 5 10*3/uL (ref 3.9–10.0)

## 2016-05-07 LAB — COMPREHENSIVE METABOLIC PANEL
ALT: 19 U/L (ref 0–55)
AST: 37 U/L — AB (ref 5–34)
Albumin: 3 g/dL — ABNORMAL LOW (ref 3.5–5.0)
Alkaline Phosphatase: 134 U/L (ref 40–150)
Anion Gap: 9 mEq/L (ref 3–11)
BUN: 17.1 mg/dL (ref 7.0–26.0)
CHLORIDE: 109 meq/L (ref 98–109)
CO2: 24 meq/L (ref 22–29)
CREATININE: 1 mg/dL (ref 0.6–1.1)
Calcium: 8.9 mg/dL (ref 8.4–10.4)
EGFR: 54 mL/min/{1.73_m2} — AB (ref >90)
Glucose: 172 mg/dl — ABNORMAL HIGH (ref 70–140)
Potassium: 4.7 mEq/L (ref 3.5–5.1)
SODIUM: 142 meq/L (ref 136–145)
Total Bilirubin: 0.41 mg/dL (ref 0.20–1.20)
Total Protein: 6.9 g/dL (ref 6.4–8.3)

## 2016-05-07 LAB — IRON AND TIBC
%SAT: 10 % — ABNORMAL LOW (ref 21–57)
IRON: 29 ug/dL — AB (ref 41–142)
TIBC: 294 ug/dL (ref 236–444)
UIBC: 265 ug/dL (ref 120–384)

## 2016-05-07 LAB — FERRITIN: Ferritin: 29 ng/ml (ref 9–269)

## 2016-05-07 MED ORDER — SODIUM CHLORIDE 0.9 % IV SOLN
Freq: Once | INTRAVENOUS | Status: AC
  Administered 2016-05-07: 12:00:00 via INTRAVENOUS

## 2016-05-07 MED ORDER — SODIUM CHLORIDE 0.9 % IV SOLN
510.0000 mg | Freq: Once | INTRAVENOUS | Status: AC
  Administered 2016-05-07: 510 mg via INTRAVENOUS
  Filled 2016-05-07: qty 17

## 2016-05-07 NOTE — Patient Instructions (Signed)

## 2016-05-07 NOTE — Patient Instructions (Signed)
Iron Deficiency Anemia, Adult Anemia is a condition in which there are less red blood cells or hemoglobin in the blood than normal. Hemoglobin is the part of red blood cells that carries oxygen. Iron deficiency anemia is anemia caused by too little iron. It is the most common type of anemia. It may leave you tired and short of breath. CAUSES   Lack of iron in the diet.  Poor absorption of iron, as seen with intestinal disorders.  Intestinal bleeding.  Heavy periods. SIGNS AND SYMPTOMS  Mild anemia may not be noticeable. Symptoms may include:  Fatigue.  Headache.  Pale skin.  Weakness.  Tiredness.  Shortness of breath.  Dizziness.  Cold hands and feet.  Fast or irregular heartbeat. DIAGNOSIS  Diagnosis requires a thorough evaluation and physical exam by your health care provider. Blood tests are generally used to confirm iron deficiency anemia. Additional tests may be done to find the underlying cause of your anemia. These may include:  Testing for blood in the stool (fecal occult blood test).  A procedure to see inside the colon and rectum (colonoscopy).  A procedure to see inside the esophagus and stomach (endoscopy). TREATMENT  Iron deficiency anemia is treated by correcting the cause of the deficiency. Treatment may involve:  Adding iron-rich foods to your diet.  Taking iron supplements. Pregnant or breastfeeding women need to take extra iron because their normal diet usually does not provide the required amount.  Taking vitamins. Vitamin C improves the absorption of iron. Your health care provider may recommend that you take your iron tablets with a glass of orange juice or vitamin C supplement.  Medicines to make heavy menstrual flow lighter.  Surgery. HOME CARE INSTRUCTIONS   Take iron as directed by your health care provider.  If you cannot tolerate taking iron supplements by mouth, talk to your health care provider about taking them through a vein  (intravenously) or an injection into a muscle.  For the best iron absorption, iron supplements should be taken on an empty stomach. If you cannot tolerate them on an empty stomach, you may need to take them with food.  Do not drink milk or take antacids at the same time as your iron supplements. Milk and antacids may interfere with the absorption of iron.  Iron supplements can cause constipation. Make sure to include fiber in your diet to prevent constipation. A stool softener may also be recommended.  Take vitamins as directed by your health care provider.  Eat a diet rich in iron. Foods high in iron include liver, lean beef, whole-grain bread, eggs, dried fruit, and dark green leafy vegetables. SEEK IMMEDIATE MEDICAL CARE IF:   You faint. If this happens, do not drive. Call your local emergency services (911 in [place] if no other help is available.  You have chest pain.  You feel nauseous or vomit.  You have severe or increased shortness of breath with activity.  You feel weak.  You have a rapid heartbeat.  You have unexplained sweating.  You become light-headed when getting up from a chair or bed. MAKE SURE YOU:   Understand these instructions.  Will watch your condition.  Will get help right away if you are not doing well or get worse.   This information is not intended to replace advice given to you by your health care provider. Make sure you discuss any questions you have with your health care provider.   Document Released: 09/18/2000 Document Revised: 10/12/2014 Document Reviewed: 05/29/2013 Elsevier   Interactive Patient Education 2016 [employer]  

## 2016-05-07 NOTE — Progress Notes (Signed)
Hematology and Oncology Follow Up Visit  Monique Fleming [id number] [birth year] 82 y.o. 05/07/2016   Principle Diagnosis:  1. Stage I (T1 N0 M0) adenocarcinoma of the right colon 2. Intermittent iron deficiency anemia secondary to chronic blood loss  Current Therapy:   IV iron as indicated     Interim History: Monique Fleming is here today with a friend for follow-up. She is feeling fatigued. Monique Fleming SOB is unchanged and she continue to use supplemental O2 at night. She is no longer smoking.  She had some nausea last week. No vomiting. She has chronic constipation and takes lactulose as needed for relief.  Monique Fleming Hgb is 9.7 with an MCV of 91. She is unsure if she has had dark tarry stools due to Monique Fleming vision.  No fever, chills, cough, rash, dizziness, headache, chest pain, abdominal pain or changes in bladder habits.  No bruising or petechiae. No lymphadenopathy found on exam.  No swelling, tenderness, numbness or tingling in Monique Fleming extremities. No new aches or pains.  Monique Fleming appetite comes and goes but she is staying hydrated. Monique Fleming weight is unchanged.   She states that she is exercising several days a week when Monique Fleming daughters can come help Monique Fleming. She continues to live alone. She prefers to be independent for as long as she can.   Medications:    Medication List       Accurate as of 05/07/16 11:28 AM. Always use your most recent med list.          albuterol 108 (90 Base) MCG/ACT inhaler Commonly known as:  PROAIR HFA Inhale 2 puffs into the lungs every 4 (four) hours as needed for wheezing or shortness of breath.   CARNATION INSTANT BREAKFAST PO Take 1 each by mouth 3 (three) times daily.   cholestyramine 4 g packet Commonly known as:  QUESTRAN MIX 1 PACKET (4 GRAM TOTAL) IN LIQUID AND DRINK DAILY AS NEEDED FOR DIARRHEA   clonazePAM 1 MG tablet Commonly known as:  KLONOPIN Take 1 tablet (1 mg total) by mouth 2 (two) times daily as needed for anxiety.   clopidogrel 75 MG tablet Commonly known as:   PLAVIX TAKE 1 TABLET DAILY   desvenlafaxine 100 MG 24 hr tablet Commonly known as:  PRISTIQ Take 100 mg by mouth daily.   fluticasone 110 MCG/ACT inhaler Commonly known as:  FLOVENT HFA Inhale 2 puffs into the lungs 2 (two) times daily.   fluticasone 50 MCG/ACT nasal spray Commonly known as:  FLONASE USE 2 SPRAYS IN EACH NOSTRIL DAILY AS NEEDED FOR ALLERGIES   formoterol 20 MCG/2ML nebulizer solution Commonly known as:  PERFOROMIST Take 2 mLs (20 mcg total) by nebulization 2 (two) times daily.   glucose blood test strip Commonly known as:  BAYER CONTOUR NEXT TEST Use as directed once daily to check blood sugar.  DX E11.9   lactulose 10 GM/15ML solution Commonly known as:  CHRONULAC Take 45 ml's 3 times daily.   Magnesium 400 MG Tabs Take 400 mg by mouth 2 (two) times daily.   metFORMIN 500 MG tablet Commonly known as:  GLUCOPHAGE TAKE ONE-HALF (1/2) TO ONE TABLET DAILY WITH BREAKFAST   montelukast 10 MG tablet Commonly known as:  SINGULAIR Take 1 tablet (10 mg total) by mouth at bedtime.   mupirocin ointment 2 % Commonly known as:  BACTROBAN Place 1 application into the nose daily. Apply to nares via clean qtip   nitrofurantoin (macrocrystal-monohydrate) 100 MG capsule Commonly known as:  MACROBID Take 1 capsule (  100 mg total) by mouth 2 (two) times daily. Please cancel the bactrim rx. Discussed with pt daughter   OCUVITE PRESERVISION Tabs Take by mouth 2 (two) times daily.   ondansetron 4 MG tablet Commonly known as:  ZOFRAN Take 1 tablet (4 mg total) by mouth every 6 (six) hours as needed for nausea or vomiting.   phenytoin 100 MG ER capsule Commonly known as:  DILANTIN Take 2 capsules (200 mg total) by mouth at bedtime. Reported on 02/13/2016   potassium chloride 10 MEQ tablet Commonly known as:  K-DUR Take 1 tablet (10 mEq total) by mouth 2 (two) times daily.   ranitidine 300 MG tablet Commonly known as:  ZANTAC Take 1 tablet daily.   rifaximin 550  MG Tabs tablet Commonly known as:  XIFAXAN Take 550 mg by mouth 2 (two) times daily.   saccharomyces boulardii 250 MG capsule Commonly known as:  FLORASTOR Take 250 mg by mouth daily.   sodium chloride 0.65 % Soln nasal spray Commonly known as:  OCEAN Place 2 sprays into both nostrils daily as needed for congestion.   SYSTANE 0.4-0.3 % Soln Generic drug:  Polyethyl Glycol-Propyl Glycol Place 1 drop into both eyes 2 (two) times daily as needed (dry eyes).   traMADol 50 MG tablet Commonly known as:  ULTRAM Take 2 tablets (100 mg total) by mouth every 12 (twelve) hours as needed for severe pain.       Allergies:  Allergies  Allergen Reactions  . Bactrim [Sulfamethoxazole-Trimethoprim] Shortness Of Breath  . Levofloxacin Other (See Comments)    seizure  . Pneumovax [Pneumococcal Polysaccharide Vaccine] Other (See Comments)    Severe allergy-cellulitis  . Amoxicillin-Pot Clavulanate Other (See Comments)    Patient stated Drs. recommend taking Augmentin due to Monique Fleming PCN allergy.  . Penicillins Rash    Has patient had a PCN reaction causing immediate rash, facial/tongue/throat swelling, SOB or lightheadedness with hypotension: No Has patient had a PCN reaction causing severe rash involving mucus membranes or skin necrosis: No Has patient had a PCN reaction that required hospitalization No Has patient had a PCN reaction occurring within the last 10 years: No If all of the above answers are "NO", then may proceed with Cephalosporin use.   . Bupropion   . Haldol [Haloperidol Lactate]     Had unknown reaction many yrs ago per pt  . Haloperidol   . Morphine   . Morphine And Related     Confused and agitation  . Pneumococcal Vaccine   . Pseudoephedrine Hcl   . Sudafed [Pseudoephedrine Hcl] Other (See Comments)    seizure    Past Medical History, Surgical history, Social history, and Family History were reviewed and updated.  Review of Systems: All other 10 point review of  systems is negative.   Physical Exam:  weight is 120 lb (54.4 kg). Monique Fleming oral temperature is 97.6 F (36.4 C). Monique Fleming blood pressure is 153/44 (abnormal) and Monique Fleming pulse is 86.   Wt Readings from Last 3 Encounters:  05/07/16 120 lb (54.4 kg)  05/01/16 122 lb 3.2 oz (55.4 kg)  04/14/16 123 lb 2 oz (55.8 kg)    Ocular: Sclerae unicteric, pupils equal, round and reactive to light Ear-nose-throat: Oropharynx clear, dentition fair Lymphatic: No cervical supraclavicular or axillary adenopathy Lungs no rales or rhonchi, good excursion bilaterally Heart regular rate and rhythm, no murmur appreciated Abd soft, nontender, positive bowel sounds, newly liver or spleen tip palpated on exam, no fluid wave MSK no focal spinal  tenderness, no joint edema Neuro: non-focal, well-oriented, appropriate affect Breasts: Deferred  Lab Results  Component Value Date   WBC 5.0 05/07/2016   HGB 9.7 (L) 05/07/2016   HCT 30.5 (L) 05/07/2016   MCV 91 05/07/2016   PLT 115 (L) 05/07/2016   Lab Results  Component Value Date   FERRITIN 20.5 05/01/2016   IRON 28 (L) 05/01/2016   TIBC 275 05/01/2016   UIBC 247 05/01/2016   IRONPCTSAT 10 (L) 05/01/2016   Lab Results  Component Value Date   RETICCTPCT 1.4 07/25/2015   RBC 3.36 (L) 05/07/2016   RETICCTABS 55.3 07/25/2015   No results found for: KPAFRELGTCHN, LAMBDASER, KAPLAMBRATIO Lab Results  Component Value Date   IGA 507 (H) 08/23/2014   No results found for: Odetta Pink, SPEI   Chemistry      Component Value Date/Time   NA 136 05/01/2016 1227   NA 140 04/10/2016 0911   K 4.3 05/01/2016 1227   K 4.7 04/10/2016 0911   CL 104 05/01/2016 1227   CL 100 09/10/2015 1054   CO2 26 05/01/2016 1227   CO2 26 04/10/2016 0911   BUN 14 05/01/2016 1227   BUN 13.6 04/10/2016 0911   CREATININE 0.85 05/01/2016 1227   CREATININE 0.9 04/10/2016 0911   GLU 88 01/27/2016      Component Value Date/Time    CALCIUM 8.8 05/01/2016 1227   CALCIUM 9.1 04/10/2016 0911   ALKPHOS 116 05/01/2016 1227   ALKPHOS 138 04/10/2016 0911   AST 41 (H) 05/01/2016 1227   AST 51 (H) 04/10/2016 0911   ALT 21 05/01/2016 1227   ALT 26 04/10/2016 0911   BILITOT 0.4 05/01/2016 1227   BILITOT 0.48 04/10/2016 0911     Impression and Plan: Monique Fleming is a 82 year old female with a history of stage I adenocarcinoma of the right colon with resection in May 2008. She has intermittent iron deficiency due to chonic blood loss from gastrointestinal AVMs.  Monique Fleming HGb today is 9.7. We will proceed with giving Monique Fleming a dose of feraheme today since she is symptomatic with fatigue. We will see what Monique Fleming iron studies show and bring Monique Fleming back in for a second dose next week if needed.  We will plan to see Monique Fleming back again in 1 month for repeat lab work and follow-up.  Both she and Monique Fleming family know to contact our office with any questions or concerns. We can certainly see Monique Fleming sooner if need be.   Eliezer Bottom, NP 8/3/201711:28 AM

## 2016-05-14 ENCOUNTER — Telehealth: Payer: Self-pay | Admitting: *Deleted

## 2016-05-14 NOTE — Telephone Encounter (Signed)
Received Physician's Orders paperwork via fax from  [facility].  Placed in folder for Dr. Charlett Blake to review and sign.//AB/CMA

## 2016-05-18 ENCOUNTER — Other Ambulatory Visit: Payer: Self-pay | Admitting: Family Medicine

## 2016-05-21 ENCOUNTER — Telehealth: Payer: Self-pay | Admitting: *Deleted

## 2016-05-21 NOTE — Telephone Encounter (Signed)
Received call from patient statin gthat her caregiver had CDiff asking if it was contagious.  Told patient that it is indeed contagious but is spread person to person by touch or direct contact with contaminated objects .  Told patient signs and symptoms of Cdiff to watch out for.

## 2016-05-26 ENCOUNTER — Encounter (HOSPITAL_COMMUNITY): Payer: Self-pay

## 2016-05-26 ENCOUNTER — Emergency Department (HOSPITAL_COMMUNITY): Payer: Medicare Other

## 2016-05-26 ENCOUNTER — Inpatient Hospital Stay (HOSPITAL_COMMUNITY)
Admission: EM | Admit: 2016-05-26 | Discharge: 2016-06-04 | DRG: 371 | Disposition: A | Discharge status: Hospice | Payer: Medicare Other | Attending: Internal Medicine | Admitting: Internal Medicine

## 2016-05-26 DIAGNOSIS — Z7984 Long term (current) use of oral hypoglycemic drugs: Secondary | ICD-10-CM

## 2016-05-26 DIAGNOSIS — N390 Urinary tract infection, site not specified: Secondary | ICD-10-CM | POA: Diagnosis present

## 2016-05-26 DIAGNOSIS — K922 Gastrointestinal hemorrhage, unspecified: Secondary | ICD-10-CM | POA: Diagnosis not present

## 2016-05-26 DIAGNOSIS — D689 Coagulation defect, unspecified: Secondary | ICD-10-CM | POA: Diagnosis present

## 2016-05-26 DIAGNOSIS — H9190 Unspecified hearing loss, unspecified ear: Secondary | ICD-10-CM | POA: Diagnosis present

## 2016-05-26 DIAGNOSIS — Z800 Family history of malignant neoplasm of digestive organs: Secondary | ICD-10-CM

## 2016-05-26 DIAGNOSIS — Z9049 Acquired absence of other specified parts of digestive tract: Secondary | ICD-10-CM

## 2016-05-26 DIAGNOSIS — R140 Abdominal distension (gaseous): Secondary | ICD-10-CM

## 2016-05-26 DIAGNOSIS — R188 Other ascites: Secondary | ICD-10-CM | POA: Diagnosis present

## 2016-05-26 DIAGNOSIS — R443 Hallucinations, unspecified: Secondary | ICD-10-CM | POA: Diagnosis present

## 2016-05-26 DIAGNOSIS — J4489 Other specified chronic obstructive pulmonary disease: Secondary | ICD-10-CM | POA: Diagnosis present

## 2016-05-26 DIAGNOSIS — Z8673 Personal history of transient ischemic attack (TIA), and cerebral infarction without residual deficits: Secondary | ICD-10-CM

## 2016-05-26 DIAGNOSIS — Z87891 Personal history of nicotine dependence: Secondary | ICD-10-CM

## 2016-05-26 DIAGNOSIS — K3189 Other diseases of stomach and duodenum: Secondary | ICD-10-CM | POA: Diagnosis present

## 2016-05-26 DIAGNOSIS — R410 Disorientation, unspecified: Secondary | ICD-10-CM

## 2016-05-26 DIAGNOSIS — Z888 Allergy status to other drugs, medicaments and biological substances status: Secondary | ICD-10-CM

## 2016-05-26 DIAGNOSIS — E876 Hypokalemia: Secondary | ICD-10-CM | POA: Diagnosis not present

## 2016-05-26 DIAGNOSIS — Z66 Do not resuscitate: Secondary | ICD-10-CM | POA: Diagnosis present

## 2016-05-26 DIAGNOSIS — Z833 Family history of diabetes mellitus: Secondary | ICD-10-CM

## 2016-05-26 DIAGNOSIS — J449 Chronic obstructive pulmonary disease, unspecified: Secondary | ICD-10-CM | POA: Diagnosis present

## 2016-05-26 DIAGNOSIS — K7460 Unspecified cirrhosis of liver: Secondary | ICD-10-CM | POA: Diagnosis present

## 2016-05-26 DIAGNOSIS — F0390 Unspecified dementia without behavioral disturbance: Secondary | ICD-10-CM | POA: Diagnosis present

## 2016-05-26 DIAGNOSIS — F5104 Psychophysiologic insomnia: Secondary | ICD-10-CM | POA: Diagnosis present

## 2016-05-26 DIAGNOSIS — G40309 Generalized idiopathic epilepsy and epileptic syndromes, not intractable, without status epilepticus: Secondary | ICD-10-CM | POA: Diagnosis present

## 2016-05-26 DIAGNOSIS — D62 Acute posthemorrhagic anemia: Secondary | ICD-10-CM | POA: Diagnosis present

## 2016-05-26 DIAGNOSIS — G9341 Metabolic encephalopathy: Secondary | ICD-10-CM | POA: Diagnosis present

## 2016-05-26 DIAGNOSIS — G40409 Other generalized epilepsy and epileptic syndromes, not intractable, without status epilepticus: Secondary | ICD-10-CM | POA: Diagnosis present

## 2016-05-26 DIAGNOSIS — Z881 Allergy status to other antibiotic agents status: Secondary | ICD-10-CM

## 2016-05-26 DIAGNOSIS — G9340 Encephalopathy, unspecified: Secondary | ICD-10-CM | POA: Diagnosis present

## 2016-05-26 DIAGNOSIS — K5520 Angiodysplasia of colon without hemorrhage: Secondary | ICD-10-CM | POA: Diagnosis present

## 2016-05-26 DIAGNOSIS — Z887 Allergy status to serum and vaccine status: Secondary | ICD-10-CM

## 2016-05-26 DIAGNOSIS — Z85038 Personal history of other malignant neoplasm of large intestine: Secondary | ICD-10-CM

## 2016-05-26 DIAGNOSIS — Z515 Encounter for palliative care: Secondary | ICD-10-CM | POA: Diagnosis not present

## 2016-05-26 DIAGNOSIS — K7290 Hepatic failure, unspecified without coma: Secondary | ICD-10-CM | POA: Diagnosis present

## 2016-05-26 DIAGNOSIS — F419 Anxiety disorder, unspecified: Secondary | ICD-10-CM | POA: Diagnosis present

## 2016-05-26 DIAGNOSIS — A047 Enterocolitis due to Clostridium difficile: Principal | ICD-10-CM | POA: Diagnosis present

## 2016-05-26 DIAGNOSIS — E119 Type 2 diabetes mellitus without complications: Secondary | ICD-10-CM

## 2016-05-26 DIAGNOSIS — K7682 Hepatic encephalopathy: Secondary | ICD-10-CM

## 2016-05-26 DIAGNOSIS — K7469 Other cirrhosis of liver: Secondary | ICD-10-CM | POA: Diagnosis present

## 2016-05-26 DIAGNOSIS — K766 Portal hypertension: Secondary | ICD-10-CM | POA: Diagnosis present

## 2016-05-26 DIAGNOSIS — I639 Cerebral infarction, unspecified: Secondary | ICD-10-CM | POA: Diagnosis not present

## 2016-05-26 DIAGNOSIS — Z880 Allergy status to penicillin: Secondary | ICD-10-CM

## 2016-05-26 DIAGNOSIS — I509 Heart failure, unspecified: Secondary | ICD-10-CM

## 2016-05-26 DIAGNOSIS — Z885 Allergy status to narcotic agent status: Secondary | ICD-10-CM

## 2016-05-26 DIAGNOSIS — R197 Diarrhea, unspecified: Secondary | ICD-10-CM

## 2016-05-26 DIAGNOSIS — Z8249 Family history of ischemic heart disease and other diseases of the circulatory system: Secondary | ICD-10-CM

## 2016-05-26 DIAGNOSIS — D696 Thrombocytopenia, unspecified: Secondary | ICD-10-CM | POA: Diagnosis not present

## 2016-05-26 DIAGNOSIS — E1151 Type 2 diabetes mellitus with diabetic peripheral angiopathy without gangrene: Secondary | ICD-10-CM | POA: Diagnosis present

## 2016-05-26 LAB — COMPREHENSIVE METABOLIC PANEL
ALT: 20 U/L (ref 14–54)
AST: 45 U/L — AB (ref 15–41)
Albumin: 2.4 g/dL — ABNORMAL LOW (ref 3.5–5.0)
Alkaline Phosphatase: 75 U/L (ref 38–126)
Anion gap: 9 (ref 5–15)
BILIRUBIN TOTAL: 0.9 mg/dL (ref 0.3–1.2)
BUN: 68 mg/dL — AB (ref 6–20)
CHLORIDE: 107 mmol/L (ref 101–111)
CO2: 22 mmol/L (ref 22–32)
CREATININE: 1.43 mg/dL — AB (ref 0.44–1.00)
Calcium: 8.8 mg/dL — ABNORMAL LOW (ref 8.9–10.3)
GFR calc Af Amer: 38 mL/min — ABNORMAL LOW (ref >60)
GFR, EST NON AFRICAN AMERICAN: 33 mL/min — AB (ref >60)
Glucose, Bld: 155 mg/dL — ABNORMAL HIGH (ref 65–99)
Potassium: 5.7 mmol/L — ABNORMAL HIGH (ref 3.5–5.1)
Sodium: 138 mmol/L (ref 135–145)
TOTAL PROTEIN: 5.2 g/dL — AB (ref 6.5–8.1)

## 2016-05-26 LAB — CBC WITH DIFFERENTIAL/PLATELET
Basophils Absolute: 0 10*3/uL (ref 0.0–0.1)
Basophils Relative: 0 %
EOS PCT: 0 %
Eosinophils Absolute: 0 10*3/uL (ref 0.0–0.7)
HEMATOCRIT: 22.3 % — AB (ref 36.0–46.0)
HEMOGLOBIN: 6.8 g/dL — AB (ref 12.0–15.0)
Lymphocytes Relative: 16 %
Lymphs Abs: 4 10*3/uL (ref 0.7–4.0)
MCH: 28.1 pg (ref 26.0–34.0)
MCHC: 29.6 g/dL — ABNORMAL LOW (ref 30.0–36.0)
MCV: 94.9 fL (ref 78.0–100.0)
MONOS PCT: 7 %
Monocytes Absolute: 1.8 10*3/uL — ABNORMAL HIGH (ref 0.1–1.0)
NEUTROS PCT: 77 %
Neutro Abs: 19.4 10*3/uL — ABNORMAL HIGH (ref 1.7–7.7)
Platelets: 255 10*3/uL (ref 150–400)
RBC: 2.35 MIL/uL — AB (ref 3.87–5.11)
RDW: 17.7 % — ABNORMAL HIGH (ref 11.5–15.5)
WBC: 25.2 10*3/uL — AB (ref 4.0–10.5)

## 2016-05-26 LAB — URINALYSIS, ROUTINE W REFLEX MICROSCOPIC
Bilirubin Urine: NEGATIVE
GLUCOSE, UA: NEGATIVE mg/dL
Hgb urine dipstick: NEGATIVE
KETONES UR: NEGATIVE mg/dL
LEUKOCYTES UA: NEGATIVE
Nitrite: NEGATIVE
PH: 5.5 (ref 5.0–8.0)
Protein, ur: NEGATIVE mg/dL
Specific Gravity, Urine: 1.024 (ref 1.005–1.030)

## 2016-05-26 LAB — I-STAT TROPONIN, ED: TROPONIN I, POC: 0.04 ng/mL (ref 0.00–0.08)

## 2016-05-26 LAB — POC OCCULT BLOOD, ED: Fecal Occult Bld: POSITIVE — AB

## 2016-05-26 MED ORDER — SODIUM CHLORIDE 0.9 % IV BOLUS (SEPSIS)
500.0000 mL | Freq: Once | INTRAVENOUS | Status: AC
  Administered 2016-05-26: 500 mL via INTRAVENOUS

## 2016-05-26 MED ORDER — VANCOMYCIN HCL IN DEXTROSE 1-5 GM/200ML-% IV SOLN
1000.0000 mg | Freq: Once | INTRAVENOUS | Status: AC
  Administered 2016-05-27: 1000 mg via INTRAVENOUS
  Filled 2016-05-26: qty 200

## 2016-05-26 MED ORDER — DEXTROSE 5 % IV SOLN
2.0000 g | Freq: Two times a day (BID) | INTRAVENOUS | Status: DC
  Administered 2016-05-26 – 2016-05-27 (×2): 2 g via INTRAVENOUS
  Filled 2016-05-26 (×3): qty 2

## 2016-05-26 MED ORDER — SODIUM CHLORIDE 0.9 % IV SOLN
Freq: Once | INTRAVENOUS | Status: AC
  Administered 2016-05-26: via INTRAVENOUS

## 2016-05-26 MED ORDER — IPRATROPIUM-ALBUTEROL 0.5-2.5 (3) MG/3ML IN SOLN
3.0000 mL | Freq: Once | RESPIRATORY_TRACT | Status: AC
  Administered 2016-05-26: 3 mL via RESPIRATORY_TRACT
  Filled 2016-05-26: qty 3

## 2016-05-26 MED ORDER — VANCOMYCIN HCL 500 MG IV SOLR
500.0000 mg | Freq: Two times a day (BID) | INTRAVENOUS | Status: DC
  Filled 2016-05-26: qty 500

## 2016-05-26 NOTE — ED Provider Notes (Signed)
[facility] DEPT Provider Note   CSN: [id number] Arrival date & time: 05/26/16  2121     History   Chief Complaint Chief Complaint  Patient presents with  . Altered Mental Status    HPI Monique Fleming is a 82 y.o. female.  HPI  Patient with extensive PMH (including chronic anemia), presents with AMS.  This morning, her daughter, who is an Rn, noticed her to be confused and just saying "um".  She complains of abdominal pain, and the daughter endorses diarrhea for 3 days.    Past Medical History:  Diagnosis Date  . Abnormal liver function test   . Adenocarcinoma ([place]) 2008   colon,s/p right hemicolectomy  . Anemia    iron deficiency  . Angina   . Anxiety    Dr Casimiro Needle  . Aortic stenosis    moderate by echo 6/11 gradient 20/90mmHg for mean and peak  . Asthma   . Atherosclerosis   . Bursitis    spine per pt  . Cellulitis   . Chronic bronchitis   . Cirrhosis of liver (HCC)    hx of  . Clostridium difficile diarrhea   . COPD (chronic obstructive pulmonary disease) ([place])   . Depression   . Diabetes mellitus    type II  . Diverticular disease   . Elevated BP 07/09/2015  . Encephalopathy, metabolic   . Epistaxis 04/14/2016  . GERD (gastroesophageal reflux disease)   . IBS (irritable bowel syndrome)   . Infectious diarrhea(009.2)   . Insomnia    chronic  . Internal hemorrhoids   . Macular degeneration 02/18/2014   Follows with [facility]  . Peripheral vascular disease ([place])   . Protein calorie malnutrition ([place])   . Renal cyst   . Seizures (HCC)    hx of one seizure "  . Sinusitis   . Spinal stenosis 03/29/2016  . Thrombocytopenia ([place]) 04/01/2013  . Thrush   . TIA (transient ischemic attack)    Dr. [name]  . Type 2 diabetes mellitus with vascular disease ([place]) 03/02/2007   Chronic      Patient Active Problem List   Diagnosis Date Noted  . Epistaxis 04/14/2016  . Spinal stenosis 03/29/2016  . Hepatic cirrhosis ([place]) 03/10/2016  . Encephalopathy,  hepatic ([place]) 02/28/2016  . Lactic acidosis 02/28/2016  . Acute CHF (congestive heart failure) ([place]) 02/28/2016  . Dementia 02/13/2016  . Gait instability   . Depression with anxiety   . Seizures ([place])   . Abnormal EEG   . Normocytic anemia   . Speech disturbance 01/18/2016  . Prolonged Q-T interval on ECG 01/18/2016  . Right shoulder pain 11/07/2015  . Elevated BP 07/09/2015  . Cold sore 04/25/2015  . Chronic respiratory failure with hypoxia ([place]) 04/25/2015  . Rib pain on left side 02/21/2015  . Pedal edema 02/07/2015  . Pain in joint, ankle and foot 02/07/2015  . Diabetes mellitus, type 2 ([place]) 12/21/2014  . Chest pain, mid sternal   . General weakness 08/22/2014  . Dehydration 08/22/2014  . Acute diarrhea 08/22/2014  . Hemorrhoids 08/22/2014  . Rectal bleeding 07/04/2014  . Nonspecific (abnormal) findings on radiological and other examination of gastrointestinal tract 07/04/2014  . Iron deficiency anemia  06/27/2014  . Mild cognitive disorder 04/03/2014  . COPD (chronic obstructive pulmonary disease) with chronic bronchitis [facility] B 03/14/2014  . Depression, major, recurrent ([place]) 03/06/2014  . Macular degeneration 02/18/2014  . Anxiety state 01/08/2014  . Carotid disease, bilateral ([place]) 01/02/2014  . Burning  with urination 12/17/2013  . Aortic stenosis 12/17/2013  . Wounds, multiple 06/28/2013  . Thrombocytopenia ([place]) 04/01/2013  . Cerebral artery occlusion ([place]) 03/11/2013  . Cardiac disease 03/11/2013  . Nasal septal perforation 07/20/2012  . Hyperlipidemia 10/20/2011  . Reaction to Pneumovax immunization 03/07/2011  . ACTINIC KERATOSIS 08/22/2010  . NEOPLASM OF UNCERTAIN BEHAVIOR OF SKIN 05/28/2010  . ANGIODYSPLASIA-INTESTINE 02/25/2010  . ANEMIA-B12 DEFICIENCY 12/30/2009  . Megaloblastic anemia due to B12 deficiency 12/30/2009  . Iron deficiency anemia 12/23/2009  . BREAST PAIN 10/09/2009  . CIRRHOSIS 03/08/2009  . DIVERTICULOSIS-COLON 02/28/2009  . History  of malignant neoplasm of large intestine 02/28/2009  . PERSONAL HX COLONIC POLYPS 02/28/2009  . UNSPECIFIED BREAST DISORDER 02/13/2009  . GRAND MAL SEIZURE 10/08/2008  . NECK PAIN 09/10/2008  . COPD with chronic bronchitis ([place]) 05/29/2008  . Malaise and fatigue 03/26/2008  . GERD 03/07/2008  . Irritable bowel syndrome 12/07/2007  . TOBACCO ABUSE, Hx of 10/05/2007  . INSOMNIA, CHRONIC 10/05/2007  . ATHEROSCLEROSIS 10/05/2007  . SLEEP RELATED MOVEMENT DISORDER UNSPECIFIED 10/05/2007  . Type 2 diabetes mellitus with vascular disease ([place]) 03/02/2007  . ANXIETY 03/02/2007  . PERIPHERAL VASCULAR DISEASE 03/02/2007  . RENAL CYST, LEFT 03/02/2007  . TRANSIENT ISCHEMIC ATTACK, HX OF 03/02/2007    Past Surgical History:  Procedure Laterality Date  . APPENDECTOMY    . CAROTID ENDARTERECTOMY    . CHOLECYSTECTOMY  11/09  . COLECTOMY     right hemi  . TONSILLECTOMY    . UPPER GASTROINTESTINAL ENDOSCOPY      OB History    No data available       Home Medications    Prior to Admission medications   Medication Sig Start Date End Date Taking? Authorizing Provider  albuterol (PROAIR HFA) 108 (90 BASE) MCG/ACT inhaler Inhale 2 puffs into the lungs every 4 (four) hours as needed for wheezing or shortness of breath. 11/12/14   Mosie Lukes, MD  cholestyramine (QUESTRAN) 4 g packet MIX 1 PACKET (4 GRAM TOTAL) IN LIQUID AND DRINK DAILY AS NEEDED FOR DIARRHEA 05/18/16   Mosie Lukes, MD  clonazePAM (KLONOPIN) 1 MG tablet Take 1 tablet (1 mg total) by mouth 2 (two) times daily as needed for anxiety. Patient taking differently: Take 0.5-1 mg by mouth 2 (two) times daily as needed for anxiety. Can take 0.5 tablet during the day if needed. She rarely takes it per daughter 01/22/16   Barton Dubois, MD  clopidogrel (PLAVIX) 75 MG tablet TAKE 1 TABLET DAILY 10/18/15   Mosie Lukes, MD  desvenlafaxine (PRISTIQ) 100 MG 24 hr tablet Take 100 mg by mouth daily.    Historical Provider, MD  fluticasone  (FLONASE) 50 MCG/ACT nasal spray USE 2 SPRAYS IN EACH NOSTRIL DAILY AS NEEDED FOR ALLERGIES 10/18/15   Mosie Lukes, MD  fluticasone (FLOVENT HFA) 110 MCG/ACT inhaler Inhale 2 puffs into the lungs 2 (two) times daily. 05/01/16   Percell Miller Saguier, PA-C  formoterol (PERFOROMIST) 20 MCG/2ML nebulizer solution Take 2 mLs (20 mcg total) by nebulization 2 (two) times daily. Patient taking differently: Take 20 mcg by nebulization 2 (two) times daily as needed. For sob & wheezing 01/14/15   Elsie Stain, MD  glucose blood (BAYER CONTOUR NEXT TEST) test strip Use as directed once daily to check blood sugar.  DX E11.9 02/18/16   Mosie Lukes, MD  lactulose (CHRONULAC) 10 GM/15ML solution Take 45 ml's 3 times daily. 03/05/16   Laban Emperor Zehr, PA-C  Magnesium  400 MG TABS Take 400 mg by mouth 2 (two) times daily. 03/02/16   Hosie Poisson, MD  metFORMIN (GLUCOPHAGE) 500 MG tablet TAKE ONE-HALF (1/2) TO ONE TABLET DAILY WITH BREAKFAST 03/15/16   Mosie Lukes, MD  montelukast (SINGULAIR) 10 MG tablet Take 1 tablet (10 mg total) by mouth at bedtime. 03/23/16   Tammy S Parrett, NP  Multiple Vitamins-Minerals (OCUVITE PRESERVISION) TABS Take by mouth 2 (two) times daily.    Historical Provider, MD  mupirocin ointment (BACTROBAN) 2 % Place 1 application into the nose daily. Apply to nares via clean qtip 04/14/16   Mosie Lukes, MD  nitrofurantoin, macrocrystal-monohydrate, (MACROBID) 100 MG capsule Take 1 capsule (100 mg total) by mouth 2 (two) times daily. Please cancel the bactrim rx. Discussed with pt daughter 05/01/16   Mackie Pai, PA-C  Nutritional Supplements (CARNATION INSTANT BREAKFAST PO) Take 1 each by mouth 3 (three) times daily.    Historical Provider, MD  ondansetron (ZOFRAN) 4 MG tablet Take 1 tablet (4 mg total) by mouth every 6 (six) hours as needed for nausea or vomiting. 03/14/15   Lori P Hvozdovic, PA-C  phenytoin (DILANTIN) 100 MG ER capsule Take 2 capsules (200 mg total) by mouth at bedtime. Reported  on 02/13/2016 04/16/16   [name], MD  Polyethyl Glycol-Propyl Glycol (SYSTANE) 0.4-0.3 % SOLN Place 1 drop into both eyes 2 (two) times daily as needed (dry eyes).     Historical Provider, MD  potassium chloride (K-DUR) 10 MEQ tablet Take 1 tablet (10 mEq total) by mouth 2 (two) times daily. 03/03/16   Mosie Lukes, MD  ranitidine (ZANTAC) 300 MG tablet Take 1 tablet daily. 03/05/16   Jessica D Zehr, PA-C  rifaximin (XIFAXAN) 550 MG TABS tablet Take 550 mg by mouth 2 (two) times daily.    Historical Provider, MD  saccharomyces boulardii (FLORASTOR) 250 MG capsule Take 250 mg by mouth daily.    Historical Provider, MD  sodium chloride (OCEAN) 0.65 % SOLN nasal spray Place 2 sprays into both nostrils daily as needed for congestion.     Historical Provider, MD  traMADol (ULTRAM) 50 MG tablet Take 2 tablets (100 mg total) by mouth every 12 (twelve) hours as needed for severe pain. 04/14/16   Mosie Lukes, MD    Family History Family History  Problem Relation Age of Onset  . Heart disease Mother   . Hypertension Father   . Colon cancer Paternal Grandmother   . Esophageal cancer Brother   . Diabetes Brother   . Rectal cancer Neg Hx   . Stomach cancer Neg Hx     Social History Social History  Substance Use Topics  . Smoking status: Former Smoker    Packs/day: 0.50    Years: 70.00    Types: Cigarettes    Start date: 02/14/1949    Quit date: 01/25/2016  . Smokeless tobacco: Never Used     Comment: 01/2016 Quit  . Alcohol use No     Allergies   Bactrim [sulfamethoxazole-trimethoprim]; Levofloxacin; Pneumovax [pneumococcal polysaccharide vaccine]; Amoxicillin-pot clavulanate; Penicillins; Bupropion; Haldol [haloperidol lactate]; Haloperidol; Morphine; Morphine and related; Pneumococcal vaccine; Pseudoephedrine hcl; and Sudafed [pseudoephedrine hcl]   Review of Systems Review of Systems  Unable to perform ROS: Mental status change     Physical Exam Updated Vital Signs SpO2 97%    Physical Exam  Constitutional: She appears well-developed and well-nourished. She has a sickly appearance. No distress.  HENT:  Head: Normocephalic and atraumatic.  Eyes:  Conjunctivae are normal.  Neck: Neck supple. No Brudzinski's sign and no Kernig's sign noted.  Cardiovascular: Normal rate and regular rhythm.   No murmur heard. Pulmonary/Chest: Effort normal and breath sounds normal. No respiratory distress.  Abdominal: Soft. There is generalized tenderness. There is guarding.  Musculoskeletal: She exhibits no edema.  Neurological: She is alert. She is disoriented. GCS eye subscore is 4. GCS verbal subscore is 4. GCS motor subscore is 6.  Alert and disoriented CN II-XII grossly intact Eyes: PERRL, EOMI Bilateral UE strength 5/5 Bilateral LE strength 5/5   Skin: Skin is warm and dry.  Psychiatric: She has a normal mood and affect.  Nursing note and vitals reviewed.    ED Treatments / Results  Labs (all labs ordered are listed, but only abnormal results are displayed) Labs Reviewed  URINE CULTURE  CBC WITH DIFFERENTIAL/PLATELET  COMPREHENSIVE METABOLIC PANEL  URINALYSIS, ROUTINE W REFLEX MICROSCOPIC (NOT AT [facility])  I-STAT TROPOININ, ED    EKG  EKG Interpretation None       Radiology No results found.  Procedures Procedures (including critical care time)  Medications Ordered in ED Medications - No data to display   Initial Impression / Assessment and Plan / ED Course  I have reviewed the triage vital signs and the nursing notes.  Pertinent labs & imaging results that were available during my care of the patient were reviewed by me and considered in my medical decision making (see chart for details).  Clinical Course    Patient presents for AMS.  Appears stable.  She is GCS 14 due to confusion, not oriented here.  Differential includes hyperammonia, infection, intracranial abnormality.  Labs show marked leukocytosis raising concern for infection.  CXR  without concern for PNA.  UA negative.  Low Hb with positive hemoccult, so protonix given, though she does have a history of anemia and chronically positive hemoccult.  Blood ordered given profound anemia.  Cr elevated is likely prerenal, so hydration given.    Discussed with Gi, they plan to see the patient in the AM as she is HDS at this point.  Admitted to hospitalist.  Final Clinical Impressions(s) / ED Diagnoses   Final diagnoses:  None    New Prescriptions New Prescriptions   No medications on file     Levada Schilling, MD 05/27/16 0031    Drenda Freeze, MD 05/27/16 1151

## 2016-05-26 NOTE — Progress Notes (Signed)
Pharmacy Antibiotic Note  Monique Fleming is a 82 y.o. female admitted on 05/26/2016 with concern for intra-abdominal infection. eCrCl ~ 15ml/min.  Plan: -Vancomycin 1 g IV x1 then 500/12h -Ceftazidime 2g/12h -Monitor renal fx, VT at [facility], cultures     Temp (24hrs), Avg:99.9 F (37.7 C), Min:99.9 F (37.7 C), Max:99.9 F (37.7 C)   Recent Labs Lab 05/26/16 2200  WBC 25.2*  CREATININE 1.43*    CrCl cannot be calculated (Unknown ideal weight.).    Allergies  Allergen Reactions  . Bactrim [Sulfamethoxazole-Trimethoprim] Shortness Of Breath  . Levofloxacin Other (See Comments)    seizure  . Pneumovax [Pneumococcal Polysaccharide Vaccine] Other (See Comments)    Severe allergy-cellulitis  . Amoxicillin-Pot Clavulanate Other (See Comments)    Patient stated Drs. recommend taking Augmentin due to her PCN allergy.  . Penicillins Rash    Has patient had a PCN reaction causing immediate rash, facial/tongue/throat swelling, SOB or lightheadedness with hypotension: No Has patient had a PCN reaction causing severe rash involving mucus membranes or skin necrosis: No Has patient had a PCN reaction that required hospitalization No Has patient had a PCN reaction occurring within the last 10 years: No If all of the above answers are "NO", then may proceed with Cephalosporin use.   . Bupropion   . Haldol [Haloperidol Lactate]     Had unknown reaction many yrs ago per pt  . Haloperidol   . Morphine   . Morphine And Related     Confused and agitation  . Pneumococcal Vaccine   . Pseudoephedrine Hcl   . Sudafed [Pseudoephedrine Hcl] Other (See Comments)    seizure    Antimicrobials this admission: 8/22 vancomycin >> 8/22 ceftazidime >>  Dose adjustments this admission: NA  Microbiology results: NA  Thank you for allowing pharmacy to be a part of this patient's care.  Harvel Quale 05/26/2016 10:56 PM

## 2016-05-26 NOTE — ED Triage Notes (Signed)
Pt arrived via EMS c/o altered mental status.  Unknown last known well, lives alone.  CBG 130.

## 2016-05-27 ENCOUNTER — Emergency Department (HOSPITAL_COMMUNITY): Payer: Medicare Other

## 2016-05-27 ENCOUNTER — Encounter (HOSPITAL_COMMUNITY): Payer: Self-pay | Admitting: Radiology

## 2016-05-27 DIAGNOSIS — I639 Cerebral infarction, unspecified: Secondary | ICD-10-CM | POA: Diagnosis not present

## 2016-05-27 DIAGNOSIS — A047 Enterocolitis due to Clostridium difficile: Secondary | ICD-10-CM | POA: Diagnosis present

## 2016-05-27 DIAGNOSIS — R443 Hallucinations, unspecified: Secondary | ICD-10-CM | POA: Diagnosis present

## 2016-05-27 DIAGNOSIS — K7290 Hepatic failure, unspecified without coma: Secondary | ICD-10-CM | POA: Diagnosis not present

## 2016-05-27 DIAGNOSIS — E876 Hypokalemia: Secondary | ICD-10-CM | POA: Diagnosis not present

## 2016-05-27 DIAGNOSIS — G40409 Other generalized epilepsy and epileptic syndromes, not intractable, without status epilepticus: Secondary | ICD-10-CM | POA: Diagnosis present

## 2016-05-27 DIAGNOSIS — Z85038 Personal history of other malignant neoplasm of large intestine: Secondary | ICD-10-CM | POA: Diagnosis not present

## 2016-05-27 DIAGNOSIS — G9340 Encephalopathy, unspecified: Secondary | ICD-10-CM | POA: Diagnosis not present

## 2016-05-27 DIAGNOSIS — R197 Diarrhea, unspecified: Secondary | ICD-10-CM | POA: Diagnosis not present

## 2016-05-27 DIAGNOSIS — N390 Urinary tract infection, site not specified: Secondary | ICD-10-CM | POA: Diagnosis present

## 2016-05-27 DIAGNOSIS — Z515 Encounter for palliative care: Secondary | ICD-10-CM | POA: Diagnosis not present

## 2016-05-27 DIAGNOSIS — K5520 Angiodysplasia of colon without hemorrhage: Secondary | ICD-10-CM | POA: Diagnosis present

## 2016-05-27 DIAGNOSIS — Z66 Do not resuscitate: Secondary | ICD-10-CM | POA: Diagnosis present

## 2016-05-27 DIAGNOSIS — D509 Iron deficiency anemia, unspecified: Secondary | ICD-10-CM

## 2016-05-27 DIAGNOSIS — Z87891 Personal history of nicotine dependence: Secondary | ICD-10-CM | POA: Diagnosis not present

## 2016-05-27 DIAGNOSIS — R140 Abdominal distension (gaseous): Secondary | ICD-10-CM | POA: Diagnosis not present

## 2016-05-27 DIAGNOSIS — G9341 Metabolic encephalopathy: Secondary | ICD-10-CM | POA: Diagnosis present

## 2016-05-27 DIAGNOSIS — R410 Disorientation, unspecified: Secondary | ICD-10-CM | POA: Diagnosis not present

## 2016-05-27 DIAGNOSIS — F0390 Unspecified dementia without behavioral disturbance: Secondary | ICD-10-CM | POA: Diagnosis present

## 2016-05-27 DIAGNOSIS — R188 Other ascites: Secondary | ICD-10-CM | POA: Diagnosis present

## 2016-05-27 DIAGNOSIS — D689 Coagulation defect, unspecified: Secondary | ICD-10-CM | POA: Diagnosis present

## 2016-05-27 DIAGNOSIS — F419 Anxiety disorder, unspecified: Secondary | ICD-10-CM | POA: Diagnosis present

## 2016-05-27 DIAGNOSIS — K3189 Other diseases of stomach and duodenum: Secondary | ICD-10-CM | POA: Diagnosis present

## 2016-05-27 DIAGNOSIS — K7460 Unspecified cirrhosis of liver: Secondary | ICD-10-CM | POA: Diagnosis not present

## 2016-05-27 DIAGNOSIS — K922 Gastrointestinal hemorrhage, unspecified: Secondary | ICD-10-CM | POA: Diagnosis present

## 2016-05-27 DIAGNOSIS — K7469 Other cirrhosis of liver: Secondary | ICD-10-CM | POA: Diagnosis present

## 2016-05-27 DIAGNOSIS — D62 Acute posthemorrhagic anemia: Secondary | ICD-10-CM | POA: Diagnosis present

## 2016-05-27 DIAGNOSIS — A09 Infectious gastroenteritis and colitis, unspecified: Secondary | ICD-10-CM | POA: Diagnosis not present

## 2016-05-27 DIAGNOSIS — K766 Portal hypertension: Secondary | ICD-10-CM | POA: Diagnosis present

## 2016-05-27 DIAGNOSIS — R933 Abnormal findings on diagnostic imaging of other parts of digestive tract: Secondary | ICD-10-CM | POA: Diagnosis not present

## 2016-05-27 DIAGNOSIS — Z9049 Acquired absence of other specified parts of digestive tract: Secondary | ICD-10-CM | POA: Diagnosis not present

## 2016-05-27 DIAGNOSIS — J449 Chronic obstructive pulmonary disease, unspecified: Secondary | ICD-10-CM | POA: Diagnosis present

## 2016-05-27 LAB — BASIC METABOLIC PANEL
ANION GAP: 11 (ref 5–15)
ANION GAP: 6 (ref 5–15)
BUN: 69 mg/dL — ABNORMAL HIGH (ref 6–20)
BUN: 80 mg/dL — ABNORMAL HIGH (ref 6–20)
CALCIUM: 8.3 mg/dL — AB (ref 8.9–10.3)
CO2: 17 mmol/L — ABNORMAL LOW (ref 22–32)
CO2: 21 mmol/L — AB (ref 22–32)
CREATININE: 1.34 mg/dL — AB (ref 0.44–1.00)
Calcium: 8.2 mg/dL — ABNORMAL LOW (ref 8.9–10.3)
Chloride: 110 mmol/L (ref 101–111)
Chloride: 115 mmol/L — ABNORMAL HIGH (ref 101–111)
Creatinine, Ser: 1.45 mg/dL — ABNORMAL HIGH (ref 0.44–1.00)
GFR, EST AFRICAN AMERICAN: 37 mL/min — AB (ref >60)
GFR, EST AFRICAN AMERICAN: 41 mL/min — AB (ref >60)
GFR, EST NON AFRICAN AMERICAN: 32 mL/min — AB (ref >60)
GFR, EST NON AFRICAN AMERICAN: 35 mL/min — AB (ref >60)
GLUCOSE: 176 mg/dL — AB (ref 65–99)
Glucose, Bld: 218 mg/dL — ABNORMAL HIGH (ref 65–99)
POTASSIUM: 5.4 mmol/L — AB (ref 3.5–5.1)
Potassium: 4.6 mmol/L (ref 3.5–5.1)
SODIUM: 142 mmol/L (ref 135–145)
Sodium: 138 mmol/L (ref 135–145)

## 2016-05-27 LAB — GASTROINTESTINAL PANEL BY PCR, STOOL (REPLACES STOOL CULTURE)
ASTROVIRUS: NOT DETECTED
Adenovirus F40/41: NOT DETECTED
CRYPTOSPORIDIUM: NOT DETECTED
CYCLOSPORA CAYETANENSIS: NOT DETECTED
Campylobacter species: NOT DETECTED
E. COLI O157: NOT DETECTED
ENTEROTOXIGENIC E COLI (ETEC): NOT DETECTED
Entamoeba histolytica: NOT DETECTED
Enteroaggregative E coli (EAEC): NOT DETECTED
Enteropathogenic E coli (EPEC): NOT DETECTED
Giardia lamblia: NOT DETECTED
Norovirus GI/GII: NOT DETECTED
PLESIMONAS SHIGELLOIDES: NOT DETECTED
ROTAVIRUS A: NOT DETECTED
SAPOVIRUS (I, II, IV, AND V): NOT DETECTED
SHIGA LIKE TOXIN PRODUCING E COLI (STEC): NOT DETECTED
Salmonella species: NOT DETECTED
Shigella/Enteroinvasive E coli (EIEC): NOT DETECTED
VIBRIO SPECIES: NOT DETECTED
Vibrio cholerae: NOT DETECTED
YERSINIA ENTEROCOLITICA: NOT DETECTED

## 2016-05-27 LAB — CBC
HCT: 30.5 % — ABNORMAL LOW (ref 36.0–46.0)
HEMATOCRIT: 19 % — AB (ref 36.0–46.0)
HEMATOCRIT: 27.1 % — AB (ref 36.0–46.0)
HEMOGLOBIN: 5.7 g/dL — AB (ref 12.0–15.0)
HEMOGLOBIN: 9.4 g/dL — AB (ref 12.0–15.0)
HEMOGLOBIN: 8.4 g/dL — AB (ref 12.0–15.0)
MCH: 28.9 pg (ref 26.0–34.0)
MCH: 28.7 pg (ref 26.0–34.0)
MCH: 28.5 pg (ref 26.0–34.0)
MCHC: 30 g/dL (ref 30.0–36.0)
MCHC: 30.8 g/dL (ref 30.0–36.0)
MCHC: 31 g/dL (ref 30.0–36.0)
MCV: 96.4 fL (ref 78.0–100.0)
MCV: 93 fL (ref 78.0–100.0)
MCV: 91.9 fL (ref 78.0–100.0)
PLATELETS: 187 10*3/uL (ref 150–400)
Platelets: 213 10*3/uL (ref 150–400)
Platelets: 181 10*3/uL (ref 150–400)
RBC: 1.97 MIL/uL — AB (ref 3.87–5.11)
RBC: 3.28 MIL/uL — AB (ref 3.87–5.11)
RBC: 2.95 MIL/uL — AB (ref 3.87–5.11)
RDW: 17.8 % — ABNORMAL HIGH (ref 11.5–15.5)
RDW: 16.2 % — ABNORMAL HIGH (ref 11.5–15.5)
RDW: 16.7 % — ABNORMAL HIGH (ref 11.5–15.5)
WBC: 21.1 10*3/uL — ABNORMAL HIGH (ref 4.0–10.5)
WBC: 23.7 10*3/uL — AB (ref 4.0–10.5)
WBC: 23.3 10*3/uL — AB (ref 4.0–10.5)

## 2016-05-27 LAB — HEMOGLOBIN AND HEMATOCRIT, BLOOD
HEMATOCRIT: 31.7 % — AB (ref 36.0–46.0)
HEMOGLOBIN: 9.7 g/dL — AB (ref 12.0–15.0)

## 2016-05-27 LAB — AMMONIA: Ammonia: 39 umol/L — ABNORMAL HIGH (ref 9–35)

## 2016-05-27 LAB — GLUCOSE, CAPILLARY
GLUCOSE-CAPILLARY: 105 mg/dL — AB (ref 65–99)
GLUCOSE-CAPILLARY: 166 mg/dL — AB (ref 65–99)
GLUCOSE-CAPILLARY: 167 mg/dL — AB (ref 65–99)
GLUCOSE-CAPILLARY: 177 mg/dL — AB (ref 65–99)
GLUCOSE-CAPILLARY: 312 mg/dL — AB (ref 65–99)
Glucose-Capillary: 175 mg/dL — ABNORMAL HIGH (ref 65–99)

## 2016-05-27 LAB — ABO/RH: ABO/RH(D): O POS

## 2016-05-27 LAB — C DIFFICILE QUICK SCREEN W PCR REFLEX
C Diff antigen: NEGATIVE
C Diff interpretation: NOT DETECTED
C Diff toxin: NEGATIVE

## 2016-05-27 LAB — PREPARE RBC (CROSSMATCH)

## 2016-05-27 LAB — MAGNESIUM: Magnesium: 1.9 mg/dL (ref 1.7–2.4)

## 2016-05-27 MED ORDER — CLONAZEPAM 1 MG PO TABS: 1.0000 mg | ORAL_TABLET | Freq: Two times a day (BID) | ORAL | Status: DC | PRN

## 2016-05-27 MED ORDER — ALBUTEROL SULFATE (2.5 MG/3ML) 0.083% IN NEBU
2.5000 mg | INHALATION_SOLUTION | RESPIRATORY_TRACT | Status: DC | PRN
  Administered 2016-06-02 – 2016-06-03 (×2): 2.5 mg via RESPIRATORY_TRACT
  Filled 2016-05-27 (×2): qty 3

## 2016-05-27 MED ORDER — CLONAZEPAM 0.5 MG PO TABS
1.0000 mg | ORAL_TABLET | Freq: Once | ORAL | Status: AC
  Administered 2016-05-27: 1 mg via ORAL
  Filled 2016-05-27: qty 2

## 2016-05-27 MED ORDER — SODIUM CHLORIDE 0.9 % IV SOLN
50.0000 ug/h | INTRAVENOUS | Status: DC
  Administered 2016-05-27 – 2016-05-28 (×2): 50 ug/h via INTRAVENOUS
  Filled 2016-05-27 (×8): qty 1

## 2016-05-27 MED ORDER — BUDESONIDE 0.25 MG/2ML IN SUSP
0.2500 mg | Freq: Two times a day (BID) | RESPIRATORY_TRACT | Status: DC
  Administered 2016-05-27 – 2016-06-04 (×17): 0.25 mg via RESPIRATORY_TRACT
  Filled 2016-05-27 (×18): qty 2

## 2016-05-27 MED ORDER — IOPAMIDOL (ISOVUE-300) INJECTION 61%
INTRAVENOUS | Status: AC
Start: 2016-05-27 — End: 2016-05-27
  Administered 2016-05-27: 75 mL
  Filled 2016-05-27: qty 75

## 2016-05-27 MED ORDER — ONDANSETRON HCL 4 MG/2ML IJ SOLN: 4.0000 mg | Freq: Four times a day (QID) | INTRAMUSCULAR | Status: DC | PRN

## 2016-05-27 MED ORDER — LACTULOSE 10 GM/15ML PO SOLN
30.0000 g | Freq: Three times a day (TID) | ORAL | Status: DC
  Administered 2016-05-27 – 2016-05-28 (×5): 30 g via ORAL
  Filled 2016-05-27 (×6): qty 45

## 2016-05-27 MED ORDER — INSULIN ASPART 100 UNIT/ML ~~LOC~~ SOLN
0.0000 [IU] | SUBCUTANEOUS | Status: DC
  Administered 2016-05-27 (×2): 2 [IU] via SUBCUTANEOUS
  Administered 2016-05-27: 7 [IU] via SUBCUTANEOUS
  Administered 2016-05-27 – 2016-05-28 (×6): 2 [IU] via SUBCUTANEOUS
  Administered 2016-05-28: 1 [IU] via SUBCUTANEOUS
  Administered 2016-05-29: 2 [IU] via SUBCUTANEOUS
  Administered 2016-05-29: 1 [IU] via SUBCUTANEOUS
  Administered 2016-05-29: 3 [IU] via SUBCUTANEOUS
  Administered 2016-05-29: 1 [IU] via SUBCUTANEOUS
  Administered 2016-05-29: 2 [IU] via SUBCUTANEOUS
  Administered 2016-05-30: 7 [IU] via SUBCUTANEOUS
  Administered 2016-05-30: 1 [IU] via SUBCUTANEOUS
  Administered 2016-05-30: 2 [IU] via SUBCUTANEOUS
  Administered 2016-05-30 – 2016-05-31 (×2): 5 [IU] via SUBCUTANEOUS
  Administered 2016-05-31 (×2): 1 [IU] via SUBCUTANEOUS
  Administered 2016-05-31: 2 [IU] via SUBCUTANEOUS
  Administered 2016-05-31: 3 [IU] via SUBCUTANEOUS
  Administered 2016-06-01: 2 [IU] via SUBCUTANEOUS
  Administered 2016-06-01: 1 [IU] via SUBCUTANEOUS
  Administered 2016-06-01: 2 [IU] via SUBCUTANEOUS
  Administered 2016-06-01: 3 [IU] via SUBCUTANEOUS
  Administered 2016-06-01: 2 [IU] via SUBCUTANEOUS
  Administered 2016-06-02: 1 [IU] via SUBCUTANEOUS
  Administered 2016-06-02: 2 [IU] via SUBCUTANEOUS
  Administered 2016-06-02: 3 [IU] via SUBCUTANEOUS
  Administered 2016-06-02: 2 [IU] via SUBCUTANEOUS

## 2016-05-27 MED ORDER — OCTREOTIDE LOAD VIA INFUSION
50.0000 ug | Freq: Once | INTRAVENOUS | Status: DC
  Filled 2016-05-27: qty 25

## 2016-05-27 MED ORDER — DESVENLAFAXINE SUCCINATE ER 100 MG PO TB24
100.0000 mg | ORAL_TABLET | Freq: Every day | ORAL | Status: DC
  Administered 2016-05-27 – 2016-06-04 (×9): 100 mg via ORAL
  Filled 2016-05-27 (×9): qty 1

## 2016-05-27 MED ORDER — MAGNESIUM OXIDE 400 (241.3 MG) MG PO TABS
400.0000 mg | ORAL_TABLET | Freq: Two times a day (BID) | ORAL | Status: DC
  Administered 2016-05-27 – 2016-05-31 (×9): 400 mg via ORAL
  Filled 2016-05-27 (×10): qty 1

## 2016-05-27 MED ORDER — SODIUM CHLORIDE 0.9 % IV SOLN
80.0000 mg | Freq: Once | INTRAVENOUS | Status: AC
  Administered 2016-05-27: 80 mg via INTRAVENOUS
  Filled 2016-05-27: qty 80

## 2016-05-27 MED ORDER — FLUTICASONE PROPIONATE 50 MCG/ACT NA SUSP
2.0000 | Freq: Every day | NASAL | Status: DC
  Administered 2016-05-27 – 2016-06-04 (×9): 2 via NASAL
  Filled 2016-05-27 (×2): qty 16

## 2016-05-27 MED ORDER — RIFAXIMIN 550 MG PO TABS
550.0000 mg | ORAL_TABLET | Freq: Two times a day (BID) | ORAL | Status: DC
  Administered 2016-05-27 – 2016-05-28 (×4): 550 mg via ORAL
  Filled 2016-05-27 (×4): qty 1

## 2016-05-27 MED ORDER — LORAZEPAM 0.5 MG PO TABS
0.2500 mg | ORAL_TABLET | Freq: Once | ORAL | Status: AC
  Administered 2016-05-27: 0.25 mg via ORAL
  Filled 2016-05-27: qty 1

## 2016-05-27 MED ORDER — SODIUM CHLORIDE 0.9 % IV SOLN
Freq: Once | INTRAVENOUS | Status: AC
  Administered 2016-05-27: 04:00:00 via INTRAVENOUS

## 2016-05-27 MED ORDER — LACTULOSE 10 GM/15ML PO SOLN
30.0000 g | Freq: Once | ORAL | Status: AC
Start: 2016-05-27 — End: 2016-05-27
  Administered 2016-05-27: 30 g via ORAL
  Filled 2016-05-27 (×2): qty 45

## 2016-05-27 MED ORDER — PANTOPRAZOLE SODIUM 40 MG IV SOLR
40.0000 mg | Freq: Two times a day (BID) | INTRAVENOUS | Status: DC
  Administered 2016-05-30 – 2016-06-03 (×8): 40 mg via INTRAVENOUS
  Filled 2016-05-27 (×8): qty 40

## 2016-05-27 MED ORDER — METRONIDAZOLE IN NACL 5-0.79 MG/ML-% IV SOLN
500.0000 mg | Freq: Three times a day (TID) | INTRAVENOUS | Status: DC
  Administered 2016-05-27 – 2016-05-31 (×14): 500 mg via INTRAVENOUS
  Filled 2016-05-27 (×14): qty 100

## 2016-05-27 MED ORDER — PHENYTOIN SODIUM EXTENDED 100 MG PO CAPS
200.0000 mg | ORAL_CAPSULE | Freq: Every day | ORAL | Status: DC
  Administered 2016-05-27 – 2016-06-03 (×9): 200 mg via ORAL
  Filled 2016-05-27 (×9): qty 2

## 2016-05-27 MED ORDER — ONDANSETRON HCL 4 MG PO TABS
4.0000 mg | ORAL_TABLET | Freq: Four times a day (QID) | ORAL | Status: DC | PRN
Start: 2016-05-27 — End: 2016-06-04

## 2016-05-27 MED ORDER — MONTELUKAST SODIUM 10 MG PO TABS
10.0000 mg | ORAL_TABLET | Freq: Every day | ORAL | Status: DC
  Administered 2016-05-27 – 2016-06-03 (×9): 10 mg via ORAL
  Filled 2016-05-27 (×9): qty 1

## 2016-05-27 MED ORDER — POTASSIUM CHLORIDE ER 10 MEQ PO TBCR: 10.0000 meq | EXTENDED_RELEASE_TABLET | Freq: Two times a day (BID) | ORAL | Status: DC

## 2016-05-27 MED ORDER — ARFORMOTEROL TARTRATE 15 MCG/2ML IN NEBU
15.0000 ug | INHALATION_SOLUTION | Freq: Two times a day (BID) | RESPIRATORY_TRACT | Status: DC
  Administered 2016-05-27 – 2016-06-04 (×17): 15 ug via RESPIRATORY_TRACT
  Filled 2016-05-27 (×18): qty 2

## 2016-05-27 MED ORDER — SODIUM CHLORIDE 0.9 % IV SOLN
8.0000 mg/h | INTRAVENOUS | Status: AC
  Administered 2016-05-27 – 2016-05-30 (×6): 8 mg/h via INTRAVENOUS
  Filled 2016-05-27 (×12): qty 80

## 2016-05-27 NOTE — Consult Note (Signed)
Consultation  Referring Provider: [facility]/Dr Dorothea Glassman Primary Care Physician:  Penni Homans, MD Primary Gastroenterologist:  Dr. Henrene Pastor  Reason for Consultation:  abdominal pain, diarrhea, GI bleeding  HPI: Monique Fleming is a 82 y.o. female known to Dr. Scarlette Shorts who has multiple medical issues. She was last seen in our office in June 2017. She has remote history of colon cancer is status post right hemicolectomy, she has chronic diarrhea, iron deficiency anemia, cryptogenic cirrhosis complicated by portal gastropathy and ascites as well as encephalopathy. She has been maintained on Xifaxan and lactulose. She last underwent colonoscopy in January 2014 with finding of AVMs in the right colon prior right hemicolectomy and otherwise negative exam. Last EGD in February 2013 mild portal gastropathy no esophageal varices and otherwise negative exam. Patient was admitted through the ED last evening after she was brought in by her daughter with onset of confusion 1 day. She had also been having diarrhea for 3 days acutely. Patient was oriented 1 on admission.  CT of the abdomen/pelvis done last evening shows cirrhosis and portal hypertension minimal ascites and evidence of a pan colitis likely infectious. Labs on admission WBC of 25.2 hemoglobin 6.8 hematocrit of 22.3 platelets 250, C. difficile quick scan was negative and GI pathogen panel is pending. She is being covered currently with [place] and Flagyl and was started on octreotide. She received 2 units of packed RBCs last night and hemoglobin this a.m. is pending. Patient remains disoriented and is unable to answer any of my questions other than stating that her abdomen is "a little bit" uncomfortable. Review of her chart shows that she was seen in primary care at the end of July and treated with Bactrim for a UTI.   Past Medical History:  Diagnosis Date  . Abnormal liver function test   . Adenocarcinoma ([place]) 2008   colon,s/p right  hemicolectomy  . Anemia    iron deficiency  . Angina   . Anxiety    Dr Casimiro Needle  . Aortic stenosis    moderate by echo 6/11 gradient 20/70mmHg for mean and peak  . Asthma   . Atherosclerosis   . Bursitis    spine per pt  . Cellulitis   . Chronic bronchitis   . Cirrhosis of liver (HCC)    hx of  . Clostridium difficile diarrhea   . COPD (chronic obstructive pulmonary disease) ([place])   . Depression   . Diabetes mellitus    type II  . Diverticular disease   . Elevated BP 07/09/2015  . Encephalopathy, metabolic   . Epistaxis 04/14/2016  . GERD (gastroesophageal reflux disease)   . IBS (irritable bowel syndrome)   . Infectious diarrhea(009.2)   . Insomnia    chronic  . Internal hemorrhoids   . Macular degeneration 02/18/2014   Follows with [facility]  . Peripheral vascular disease ([place])   . Protein calorie malnutrition ([place])   . Renal cyst   . Seizures (HCC)    hx of one seizure "  . Sinusitis   . Spinal stenosis 03/29/2016  . Thrombocytopenia ([place]) 04/01/2013  . Thrush   . TIA (transient ischemic attack)    Dr. [name]  . Type 2 diabetes mellitus with vascular disease ([place]) 03/02/2007   Chronic      Past Surgical History:  Procedure Laterality Date  . APPENDECTOMY    . CAROTID ENDARTERECTOMY    . CHOLECYSTECTOMY  11/09  . COLECTOMY     right hemi  .  TONSILLECTOMY    . UPPER GASTROINTESTINAL ENDOSCOPY      Prior to Admission medications   Medication Sig Start Date End Date Taking? Authorizing Provider  albuterol (PROAIR HFA) 108 (90 BASE) MCG/ACT inhaler Inhale 2 puffs into the lungs every 4 (four) hours as needed for wheezing or shortness of breath. 11/12/14  Yes Mosie Lukes, MD  cholestyramine (QUESTRAN) 4 g packet MIX 1 PACKET (4 GRAM TOTAL) IN LIQUID AND DRINK DAILY AS NEEDED FOR DIARRHEA 05/18/16  Yes Mosie Lukes, MD  clonazePAM (KLONOPIN) 1 MG tablet Take 1 tablet (1 mg total) by mouth 2 (two) times daily as needed for anxiety. Patient taking differently: Take  1 mg by mouth See admin instructions. Takes 1mg  as needed during the day for anxiety, 1mg  every night at bedtime. 01/22/16  Yes Barton Dubois, MD  clopidogrel (PLAVIX) 75 MG tablet TAKE 1 TABLET DAILY 10/18/15  Yes Mosie Lukes, MD  desvenlafaxine (PRISTIQ) 100 MG 24 hr tablet Take 100 mg by mouth daily.   Yes Historical Provider, MD  fluticasone (FLONASE) 50 MCG/ACT nasal spray USE 2 SPRAYS IN EACH NOSTRIL DAILY AS NEEDED FOR ALLERGIES 10/18/15  Yes Mosie Lukes, MD  fluticasone (FLOVENT HFA) 110 MCG/ACT inhaler Inhale 2 puffs into the lungs 2 (two) times daily. Patient taking differently: Inhale 2 puffs into the lungs 2 (two) times daily as needed (shortness of breath).  05/01/16  Yes Edward Saguier, PA-C  formoterol (PERFOROMIST) 20 MCG/2ML nebulizer solution Take 2 mLs (20 mcg total) by nebulization 2 (two) times daily. 01/14/15  Yes Elsie Stain, MD  glucose blood (BAYER CONTOUR NEXT TEST) test strip Use as directed once daily to check blood sugar.  DX E11.9 02/18/16  Yes Mosie Lukes, MD  lactulose (CHRONULAC) 10 GM/15ML solution Take 45 ml's 3 times daily. 03/05/16  Yes Laban Emperor Zehr, PA-C  Magnesium 400 MG TABS Take 400 mg by mouth 2 (two) times daily. Patient taking differently: Take 500-1,000 mg by mouth 2 (two) times daily. Alternating between 1 day take 500mg  twice daily, the next day take 1000mg  in the morning and 500mg  in the evening. 03/02/16  Yes Hosie Poisson, MD  metFORMIN (GLUCOPHAGE) 500 MG tablet TAKE ONE-HALF (1/2) TO ONE TABLET DAILY WITH BREAKFAST Patient taking differently: TAKE 1 TABLET EVERY MORNING. 03/15/16  Yes Mosie Lukes, MD  montelukast (SINGULAIR) 10 MG tablet Take 1 tablet (10 mg total) by mouth at bedtime. 03/23/16  Yes Tammy S Parrett, NP  Multiple Vitamins-Minerals (OCUVITE PRESERVISION) TABS Take 1 tablet by mouth 2 (two) times daily.    Yes Historical Provider, MD  Nutritional Supplements (CARNATION INSTANT BREAKFAST PO) Take 1 each by mouth 3 (three) times  daily.   Yes Historical Provider, MD  ondansetron (ZOFRAN) 4 MG tablet Take 1 tablet (4 mg total) by mouth every 6 (six) hours as needed for nausea or vomiting. 03/14/15  Yes Lori P Hvozdovic, PA-C  phenytoin (DILANTIN) 100 MG ER capsule Take 2 capsules (200 mg total) by mouth at bedtime. Reported on 02/13/2016 04/16/16  Yes [name], MD  Polyethyl Glycol-Propyl Glycol (SYSTANE) 0.4-0.3 % SOLN Place 1 drop into both eyes See admin instructions. During the day as needed for dry eyes, every night at bedtime.   Yes Historical Provider, MD  potassium chloride (K-DUR) 10 MEQ tablet Take 1 tablet (10 mEq total) by mouth 2 (two) times daily. 03/03/16  Yes Mosie Lukes, MD  ranitidine (ZANTAC) 300 MG tablet Take 1 tablet  daily. 03/05/16  Yes Jessica D Zehr, PA-C  rifaximin (XIFAXAN) 550 MG TABS tablet Take 550 mg by mouth 2 (two) times daily.   Yes Historical Provider, MD  saccharomyces boulardii (FLORASTOR) 250 MG capsule Take 250 mg by mouth 2 (two) times daily.    Yes Historical Provider, MD  sodium chloride (OCEAN) 0.65 % SOLN nasal spray Place 2 sprays into both nostrils daily as needed for congestion.    Yes Historical Provider, MD  traMADol (ULTRAM) 50 MG tablet Take 2 tablets (100 mg total) by mouth every 12 (twelve) hours as needed for severe pain. 04/14/16  Yes Mosie Lukes, MD    Current Facility-Administered Medications  Medication Dose Route Frequency Provider Last Rate Last Dose  . albuterol (PROVENTIL) (2.5 MG/3ML) 0.083% nebulizer solution 2.5 mg  2.5 mg Inhalation Q4H PRN Rise Patience, MD      . arformoterol [facility]) nebulizer solution 15 mcg  15 mcg Nebulization BID Rise Patience, MD   15 mcg at 05/27/16 [id number]  . budesonide (PULMICORT) nebulizer solution 0.25 mg  0.25 mg Nebulization BID Rise Patience, MD   0.25 mg at 05/27/16 [id number]  . cefTAZidime (FORTAZ) 2 g in dextrose 5 % 50 mL IVPB  2 g Intravenous Q12H Drenda Freeze, MD 100 mL/hr at 05/27/16 0930 2 g at  05/27/16 0930  . clonazePAM (KLONOPIN) tablet 1 mg  1 mg Oral BID PRN Rise Patience, MD      . desvenlafaxine (PRISTIQ) 24 hr tablet 100 mg  100 mg Oral Daily Rise Patience, MD   100 mg at 05/27/16 0840  . fluticasone (FLONASE) 50 MCG/ACT nasal spray 2 spray  2 spray Each Nare Daily Rise Patience, MD   2 spray at 05/27/16 [phone]  . insulin aspart (novoLOG) injection 0-9 Units  0-9 Units Subcutaneous Q4H Rise Patience, MD   2 Units at 05/27/16 [phone]  . lactulose (CHRONULAC) 10 GM/15ML solution 30 g  30 g Oral TID Rise Patience, MD   30 g at 05/27/16 0841  . magnesium oxide (MAG-OX) tablet 400 mg  400 mg Oral BID Rise Patience, MD   400 mg at 05/27/16 0840  . metroNIDAZOLE (FLAGYL) IVPB 500 mg  500 mg Intravenous Q8H Rise Patience, MD   500 mg at 05/27/16 0411  . montelukast (SINGULAIR) tablet 10 mg  10 mg Oral QHS Rise Patience, MD   10 mg at 05/27/16 0412  . octreotide (SANDOSTATIN) 2 mcg/mL load via infusion 50 mcg  50 mcg Intravenous Once Rise Patience, MD       And  . octreotide (SANDOSTATIN) 500 mcg in sodium chloride 0.9 % 250 mL (2 mcg/mL) infusion  50 mcg/hr Intravenous Continuous Rise Patience, MD      . ondansetron [facility]) tablet 4 mg  4 mg Oral Q6H PRN Rise Patience, MD       Or  . ondansetron (ZOFRAN) injection 4 mg  4 mg Intravenous Q6H PRN Rise Patience, MD      . pantoprazole (PROTONIX) 80 mg in sodium chloride 0.9 % 250 mL (0.32 mg/mL) infusion  8 mg/hr Intravenous Continuous Rise Patience, MD 25 mL/hr at 05/27/16 0523 8 mg/hr at 05/27/16 0523  . [START ON 05/30/2016] pantoprazole (PROTONIX) injection 40 mg  40 mg Intravenous Q12H Rise Patience, MD      . phenytoin (DILANTIN) ER capsule 200 mg  200 mg Oral QHS Rise Patience, MD  200 mg at 05/27/16 0412  . rifaximin (XIFAXAN) tablet 550 mg  550 mg Oral BID Rise Patience, MD   550 mg at 05/27/16 [id number]    Allergies as of 05/26/2016 - Review  Complete 05/26/2016  Allergen Reaction Noted  . Bactrim [sulfamethoxazole-trimethoprim] Shortness Of Breath 03/31/2012  . Levofloxacin Other (See Comments) 04/16/2011  . Pneumovax [pneumococcal polysaccharide vaccine] Other (See Comments) 03/13/2011  . Amoxicillin-pot clavulanate Other (See Comments) 04/20/2011  . Penicillins Rash   . Bupropion  04/05/2014  . Haldol [haloperidol lactate]  02/09/2012  . Haloperidol  04/05/2014  . Morphine  04/05/2014  . Morphine and related  07/22/2011  . Pneumococcal vaccine  04/05/2014  . Pseudoephedrine hcl  04/05/2014  . Sudafed [pseudoephedrine hcl] Other (See Comments) 04/16/2011    Family History  Problem Relation Age of Onset  . Heart disease Mother   . Hypertension Father   . Colon cancer Paternal Grandmother   . Esophageal cancer Brother   . Diabetes Brother   . Rectal cancer Neg Hx   . Stomach cancer Neg Hx     Social History   Social History  . Marital status: Widowed    Spouse name: N/A  . Number of children: 3  . Years of education: N/A   Occupational History  . retired   .  Retired   Social History Main Topics  . Smoking status: Former Smoker    Packs/day: 0.50    Years: 70.00    Types: Cigarettes    Start date: 02/14/1949    Quit date: 01/25/2016  . Smokeless tobacco: Never Used     Comment: 01/2016 Quit  . Alcohol use No  . Drug use: No  . Sexual activity: No   Other Topics Concern  . Not on file   Social History Narrative   Patient signed a Designated Party Release to allow her daughter, Izumi Cuadrado, to have access to her medical records/information. Entered by Fleet Contras March 24,2011 @ 2:47 pm   Dailly Caffeine Use    Review of Systems: Pertinent positive and negative review of systems were noted in the above HPI section.  All other review of systems was otherwise negative.  Physical Exam: Vital signs in last 24 hours: Temp:  [97.5 F (36.4 C)-99.9 F (37.7 C)] 98.1 F (36.7 C) (08/23  0918) Pulse Rate:  [70-121] 70 (08/23 0918) Resp:  [16-18] 16 (08/23 0918) BP: (102-129)/(31-91) 129/52 (08/23 0918) SpO2:  [95 %-100 %] 97 % (08/23 0918) FiO2 (%):  [0 %] 0 % (08/23 0306) Weight:  [123 lb 12.8 oz (56.2 kg)] 123 lb 12.8 oz (56.2 kg) (08/23 0144) Last BM Date: 05/27/16 General:   Alert, well-developed, well-nourished, pleasant and cooperative in NAD Head:  Normocephalic and atraumatic. Eyes:  Sclera clear, no icterus. Conjunctiva pink. Ears:  Normal auditory acuity. Nose:  No deformity, discharge, or lesions. Mouth:  No deformity or lesions.   Neck:  Supple; no masses or thyromegaly. Lungs:  Clear throughout to auscultation. No wheezes, crackles, or rhonchi.  Heart:  Regular rate and rhythm; no murmurs, clicks, rubs, or gallops. Abdomen:  Soft,nontender, BS active,nonpalp mass or hsm.   Rectal:  Deferred  Msk:  Symmetrical without gross deformities. . Pulses:  Normal pulses noted. Extremities:  Without clubbing or edema. Neurologic:  Alert and  oriented x4;  grossly normal neurologically. Skin:  Intact without significant lesions or rashes.. Psych:  Alert and cooperative. Normal mood and affect.  Intake/Output from previous day: 08/22 0701 -  08/23 0700 In: 655 [P.O.:120; Blood:335; IV Piggyback:200] Out: -  Intake/Output this shift: Total I/O In: 320 [Blood:320] Out: 200 [Stool:200]  Lab Results:  Recent Labs  05/26/16 2200 05/27/16 0212  WBC 25.2* 21.1*  HGB 6.8* 5.7*  HCT 22.3* 19.0*  PLT 255 213   BMET  Recent Labs  05/26/16 2200 05/27/16 0212  NA 138 138  K 5.7* 5.4*  CL 107 110  CO2 22 17*  GLUCOSE 155* 176*  BUN 68* 69*  CREATININE 1.43* 1.45*  CALCIUM 8.8* 8.2*   LFT  Recent Labs  05/26/16 2200  PROT 5.2*  ALBUMIN 2.4*  AST 45*  ALT 20  ALKPHOS 75  BILITOT 0.9   PT/INR No results for input(s): LABPROT, INR in the last 72 hours.   IMPRESSION:  #7  82 year old with multiple medical problems admitted last night with a  combination of hepatic encephalopathy, acute diarrhea  3 days on chronic diarrhea and CT finding of pancolitis. Patient does not appear to be having an acute active GI bleed but hemoglobin was down 3 g from her baseline. She is certainly at higher risk for C. difficile colitis with chronic Xifaxan and recent course of Bactrim. Would cover with oral vancomycin for now until GI pathogen panel returns, and discontinue [place]. #2 cirrhosis complicated by portal hypertension ( no varices ), ascites and hepatic encephalopathy #3 chronic iron deficiency anemia likely secondary to right colon AVMs #4 COPD #5 diabetes mellitus #6 history of colon cancer status post right hemicolectomy last colonoscopy 2014   PLAN: #1 patient has been transfused 2 units of packed RBCs, continue to follow serial hemoglobins and transfuse as needed to keep hemoglobin > 7-8 #2 stop octreotide #3 stop [place] #4 start oral vancomycin 250 mL by mouth 4 times a day #5 continue IV metronidazole for now #6 clear liquid diet #7 continue oral lactulose We will follow with you.   Amy Esterwood  05/27/2016, 10:08 AM      Attending physician's note   I have taken a history, examined the patient and reviewed the chart. I agree with the Advanced Practitioner's note, impression and recommendations. Acute on chronic diarrhea with pancolitis on CT scan, likely acute infectious diarrhea. Worsening of chronic iron deficiency anemia without clear signs of active GI bleeding. C diff quick scan was negative. Await rest of stool studies and trend CBC. Consider IV Fe replacement-defer to hospitalist.   Lucio Edward, MD [facility] [phone] Mon-Fri 8a-5p [phone] after 5p, weekends, holidays

## 2016-05-27 NOTE — H&P (Signed)
History and Physical    Monique Fleming [id number] DOB: Feb 15, 1929 DOA: 05/26/2016  PCP: Penni Homans, MD  Monique Fleming coming from: Home.  Chief Complaint: Confusion.  History obtained from Monique Fleming.  HPI: Monique Fleming is a 82 y.o. female with cirrhosis of the liver, COPD, diabetes mellitus was brought to the ER after Monique Fleming was found to be increasingly confused by Monique Fleming. Monique Fleming last 2 days also has been having some abdominal discomfort with multiple episodes of diarrhea. In the ER Monique Fleming is oriented to her name only. Ammonia levels are mildly elevated. CT of abdomen and pelvis shows pancolitis. Hemoglobin has dropped from 9-6. ER physician had discussed with on-call [place] gastroenterologist. Monique Fleming is being admitted for hepatic encephalopathy and GI bleed. In addition CT also shows colitis.   ED Course: CT abdomen shows colitis.  Review of Systems: As per HPI, rest all negative.   Past Medical History:  Diagnosis Date  . Abnormal liver function test   . Adenocarcinoma ([place]) 2008   colon,s/p right hemicolectomy  . Anemia    iron deficiency  . Angina   . Anxiety    Dr Casimiro Needle  . Aortic stenosis    moderate by echo 6/11 gradient 20/36mmHg for mean and peak  . Asthma   . Atherosclerosis   . Bursitis    spine per pt  . Cellulitis   . Chronic bronchitis   . Cirrhosis of liver (HCC)    hx of  . Clostridium difficile diarrhea   . COPD (chronic obstructive pulmonary disease) ([place])   . Depression   . Diabetes mellitus    type II  . Diverticular disease   . Elevated BP 07/09/2015  . Encephalopathy, metabolic   . Epistaxis 04/14/2016  . GERD (gastroesophageal reflux disease)   . IBS (irritable bowel syndrome)   . Infectious diarrhea(009.2)   . Insomnia    chronic  . Internal hemorrhoids   . Macular degeneration 02/18/2014   Follows with [facility]  . Peripheral vascular disease ([place])   . Protein calorie malnutrition (Home)   . Renal cyst   .  Seizures (HCC)    hx of one seizure "  . Sinusitis   . Spinal stenosis 03/29/2016  . Thrombocytopenia ([place]) 04/01/2013  . Thrush   . TIA (transient ischemic attack)    Dr. [name]  . Type 2 diabetes mellitus with vascular disease ([place]) 03/02/2007   Chronic      Past Surgical History:  Procedure Laterality Date  . APPENDECTOMY    . CAROTID ENDARTERECTOMY    . CHOLECYSTECTOMY  11/09  . COLECTOMY     right hemi  . TONSILLECTOMY    . UPPER GASTROINTESTINAL ENDOSCOPY       reports that she quit smoking about 4 months ago. Her smoking use included Cigarettes. She started smoking about 65 years ago. She has a 35.00 pack-year smoking history. She has never used smokeless tobacco. She reports that she does not drink alcohol or use drugs.  Allergies  Allergen Reactions  . Bactrim [Sulfamethoxazole-Trimethoprim] Shortness Of Breath  . Levofloxacin Other (See Comments)    seizure  . Pneumovax [Pneumococcal Polysaccharide Vaccine] Other (See Comments)    Severe allergy-cellulitis  . Amoxicillin-Pot Clavulanate Other (See Comments)    Monique Fleming stated Drs. recommend taking Augmentin due to her PCN allergy.  . Penicillins Rash    Has Monique Fleming had a PCN reaction causing immediate rash, facial/tongue/throat swelling, SOB or lightheadedness with hypotension: No Has Monique Fleming had a  PCN reaction causing severe rash involving mucus membranes or skin necrosis: No Has Monique Fleming had a PCN reaction that required hospitalization No Has Monique Fleming had a PCN reaction occurring within the last 10 years: No If all of the above answers are "NO", then may proceed with Cephalosporin use.   . Bupropion Other (See Comments)    Hallucinating   . Haldol [Haloperidol Lactate]     Had unknown reaction many yrs ago per pt  . Morphine And Related     Confused and agitation  . Sudafed [Pseudoephedrine Hcl] Other (See Comments)    seizure    Family History  Problem Relation Age of Onset  . Heart disease Mother   .  Hypertension Father   . Colon cancer Paternal Grandmother   . Esophageal cancer Brother   . Diabetes Brother   . Rectal cancer Neg Hx   . Stomach cancer Neg Hx     Prior to Admission medications   Medication Sig Start Date End Date Taking? Authorizing Provider  albuterol (PROAIR HFA) 108 (90 BASE) MCG/ACT inhaler Inhale 2 puffs into the lungs every 4 (four) hours as needed for wheezing or shortness of breath. 11/12/14  Yes Mosie Lukes, MD  cholestyramine (QUESTRAN) 4 g packet MIX 1 PACKET (4 GRAM TOTAL) IN LIQUID AND DRINK DAILY AS NEEDED FOR DIARRHEA 05/18/16  Yes Mosie Lukes, MD  clonazePAM (KLONOPIN) 1 MG tablet Take 1 tablet (1 mg total) by mouth 2 (two) times daily as needed for anxiety. Monique Fleming taking differently: Take 1 mg by mouth See admin instructions. Takes 1mg  as needed during the day for anxiety, 1mg  every night at bedtime. 01/22/16  Yes Barton Dubois, MD  clopidogrel (PLAVIX) 75 MG tablet TAKE 1 TABLET DAILY 10/18/15  Yes Mosie Lukes, MD  desvenlafaxine (PRISTIQ) 100 MG 24 hr tablet Take 100 mg by mouth daily.   Yes Historical Provider, MD  fluticasone (FLONASE) 50 MCG/ACT nasal spray USE 2 SPRAYS IN EACH NOSTRIL DAILY AS NEEDED FOR ALLERGIES 10/18/15  Yes Mosie Lukes, MD  fluticasone (FLOVENT HFA) 110 MCG/ACT inhaler Inhale 2 puffs into the lungs 2 (two) times daily. Monique Fleming taking differently: Inhale 2 puffs into the lungs 2 (two) times daily as needed (shortness of breath).  05/01/16  Yes Edward Saguier, PA-C  formoterol (PERFOROMIST) 20 MCG/2ML nebulizer solution Take 2 mLs (20 mcg total) by nebulization 2 (two) times daily. 01/14/15  Yes Elsie Stain, MD  glucose blood (BAYER CONTOUR NEXT TEST) test strip Use as directed once daily to check blood sugar.  DX E11.9 02/18/16  Yes Mosie Lukes, MD  lactulose (CHRONULAC) 10 GM/15ML solution Take 45 ml's 3 times daily. 03/05/16  Yes Laban Emperor Zehr, PA-C  Magnesium 400 MG TABS Take 400 mg by mouth 2 (two) times  daily. Monique Fleming taking differently: Take 500-1,000 mg by mouth 2 (two) times daily. Alternating between 1 day take 500mg  twice daily, the next day take 1000mg  in the morning and 500mg  in the evening. 03/02/16  Yes Hosie Poisson, MD  metFORMIN (GLUCOPHAGE) 500 MG tablet TAKE ONE-HALF (1/2) TO ONE TABLET DAILY WITH BREAKFAST Monique Fleming taking differently: TAKE 1 TABLET EVERY MORNING. 03/15/16  Yes Mosie Lukes, MD  montelukast (SINGULAIR) 10 MG tablet Take 1 tablet (10 mg total) by mouth at bedtime. 03/23/16  Yes Tammy S Parrett, NP  Multiple Vitamins-Minerals (OCUVITE PRESERVISION) TABS Take 1 tablet by mouth 2 (two) times daily.    Yes Historical Provider, MD  Nutritional Supplements (CARNATION  INSTANT BREAKFAST PO) Take 1 each by mouth 3 (three) times daily.   Yes Historical Provider, MD  ondansetron (ZOFRAN) 4 MG tablet Take 1 tablet (4 mg total) by mouth every 6 (six) hours as needed for nausea or vomiting. 03/14/15  Yes Lori P Hvozdovic, PA-C  phenytoin (DILANTIN) 100 MG ER capsule Take 2 capsules (200 mg total) by mouth at bedtime. Reported on 02/13/2016 04/16/16  Yes [name], MD  Polyethyl Glycol-Propyl Glycol (SYSTANE) 0.4-0.3 % SOLN Place 1 drop into both eyes See admin instructions. During the day as needed for dry eyes, every night at bedtime.   Yes Historical Provider, MD  potassium chloride (K-DUR) 10 MEQ tablet Take 1 tablet (10 mEq total) by mouth 2 (two) times daily. 03/03/16  Yes Mosie Lukes, MD  ranitidine (ZANTAC) 300 MG tablet Take 1 tablet daily. 03/05/16  Yes Jessica D Zehr, PA-C  rifaximin (XIFAXAN) 550 MG TABS tablet Take 550 mg by mouth 2 (two) times daily.   Yes Historical Provider, MD  saccharomyces boulardii (FLORASTOR) 250 MG capsule Take 250 mg by mouth 2 (two) times daily.    Yes Historical Provider, MD  sodium chloride (OCEAN) 0.65 % SOLN nasal spray Place 2 sprays into both nostrils daily as needed for congestion.    Yes Historical Provider, MD  traMADol (ULTRAM) 50 MG  tablet Take 2 tablets (100 mg total) by mouth every 12 (twelve) hours as needed for severe pain. 04/14/16  Yes Mosie Lukes, MD    Physical Exam: Vitals:   05/26/16 2300 05/26/16 2330 05/27/16 0000 05/27/16 0144  BP: (!) 116/42 110/87 108/91 (!) 115/31  Pulse: 116 114 108 (!) 115  Resp:   16 16  Temp:    98.5 F (36.9 C)  TempSrc:      SpO2: 99% 100% 100% 100%  Weight:    123 lb 12.8 oz (56.2 kg)  Height:    5\' 4"  (1.626 m)      Constitutional: Not in distress. Vitals:   05/26/16 2300 05/26/16 2330 05/27/16 0000 05/27/16 0144  BP: (!) 116/42 110/87 108/91 (!) 115/31  Pulse: 116 114 108 (!) 115  Resp:   16 16  Temp:    98.5 F (36.9 C)  TempSrc:      SpO2: 99% 100% 100% 100%  Weight:    123 lb 12.8 oz (56.2 kg)  Height:    5\' 4"  (1.626 m)   Eyes: Anicteric mild pallor. ENMT: No discharge from the ears eyes nose or mouth. Neck: No mass felt. No JVD appreciated. Respiratory: No rhonchi or crepitations. Cardiovascular: S1 and S2 heard. Abdomen: Soft nontender bowel sounds present. No guarding or rigidity. Musculoskeletal: No edema. Skin: No rash. Neurologic: Alert awake oriented to her name. Moves all extremities. Psychiatric: Mildly confused.   Labs on Admission: I have personally reviewed following labs and imaging studies  CBC:  Recent Labs Lab 05/26/16 2200  WBC 25.2*  NEUTROABS 19.4*  HGB 6.8*  HCT 22.3*  MCV 94.9  PLT [id number]   Basic Metabolic Panel:  Recent Labs Lab 05/26/16 2200  NA 138  K 5.7*  CL 107  CO2 22  GLUCOSE 155*  BUN 68*  CREATININE 1.43*  CALCIUM 8.8*   GFR: Estimated Creatinine Clearance: 25.3 mL/min (by C-G formula based on SCr of 1.43 mg/dL). Liver Function Tests:  Recent Labs Lab 05/26/16 2200  AST 45*  ALT 20  ALKPHOS 75  BILITOT 0.9  PROT 5.2*  ALBUMIN 2.4*  No results for input(s): LIPASE, AMYLASE in the last 168 hours.  Recent Labs Lab 05/27/16 0100  AMMONIA 39*   Coagulation Profile: No results for  input(s): INR, PROTIME in the last 168 hours. Cardiac Enzymes: No results for input(s): CKTOTAL, CKMB, CKMBINDEX, TROPONINI in the last 168 hours. BNP (last 3 results) No results for input(s): PROBNP in the last 8760 hours. HbA1C: No results for input(s): HGBA1C in the last 72 hours. CBG: No results for input(s): GLUCAP in the last 168 hours. Lipid Profile: No results for input(s): CHOL, HDL, LDLCALC, TRIG, CHOLHDL, LDLDIRECT in the last 72 hours. Thyroid Function Tests: No results for input(s): TSH, T4TOTAL, FREET4, T3FREE, THYROIDAB in the last 72 hours. Anemia Panel: No results for input(s): VITAMINB12, FOLATE, FERRITIN, TIBC, IRON, RETICCTPCT in the last 72 hours. Urine analysis:    Component Value Date/Time   COLORURINE YELLOW 05/26/2016 [place] 05/26/2016 2245   LABSPEC 1.024 05/26/2016 2245   PHURINE 5.5 05/26/2016 2245   GLUCOSEU NEGATIVE 05/26/2016 2245   GLUCOSEU NEGATIVE 08/13/2011 0846   HGBUR NEGATIVE 05/26/2016 2245   HGBUR negative 02/04/2009 1309   BILIRUBINUR NEGATIVE 05/26/2016 2245   BILIRUBINUR neg 05/01/2016 1110   KETONESUR NEGATIVE 05/26/2016 2245   PROTEINUR NEGATIVE 05/26/2016 2245   UROBILINOGEN 0.2 05/01/2016 1110   UROBILINOGEN 1.0 05/25/2015 1757   NITRITE NEGATIVE 05/26/2016 2245   LEUKOCYTESUR NEGATIVE 05/26/2016 2245   Sepsis Labs: @LABRCNTIP (procalcitonin:4,lacticidven:4) ) Recent Results (from the past 240 hour(s))  C difficile quick scan w PCR reflex     Status: None   Collection Time: 05/26/16 10:40 PM  Result Value Ref Range Status   C Diff antigen NEGATIVE NEGATIVE Final   C Diff toxin NEGATIVE NEGATIVE Final   C Diff interpretation No C. difficile detected.  Final     Radiological Exams on Admission: Dg Chest 2 View  Result Date: 05/26/2016 CLINICAL DATA:  Altered mental status. History of asthma and bronchitis. COPD. EXAM: CHEST  2 VIEW COMPARISON:  Chest radiograph 05/01/2016 FINDINGS: Shallow lung inflation  with diffusely increased pulmonary interstitial markings, similar prior study. Cardiomediastinal contours are within normal limits when allowing for AP technique. No focal airspace consolidation or pulmonary edema. No pneumothorax or sizable pleural effusion. IMPRESSION: No radiographic evidence of pneumonia. Electronically Signed   By: Ulyses Jarred M.D.   On: 05/26/2016 22:22   Ct Head Wo Contrast  Result Date: 05/27/2016 CLINICAL DATA:  82 y/o F; altered mental status with history of TIA and seizures. EXAM: CT HEAD WITHOUT CONTRAST TECHNIQUE: Contiguous axial images were obtained from the base of the skull through the vertex without intravenous contrast. COMPARISON:  None. FINDINGS: Brain: No evidence of acute infarction, hemorrhage, hydrocephalus, extra-axial collection or mass lesion/mass effect. Stable moderate parenchymal volume loss and chronic microvascular ischemic changes. Stable bilateral cerebellar and left occipital small chronic infarcts. Vascular: No hyperdense vessel. Extensive calcific atherosclerosis of cavernous internal carotid arteries and vertebral arteries. Skull: Negative. Sinuses/Orbits: Bilateral intra-ocular lens replacement. No acute finding. Other: Bilateral high-riding jugular bulbs within the sigmoid plates. IMPRESSION: No acute intracranial abnormality. Stable parenchymal volume loss, chronic microvascular ischemic changes and small chronic infarcts. Electronically Signed   By: Kristine Garbe M.D.   On: 05/27/2016 01:17   Ct Abdomen Pelvis W Contrast  Result Date: 05/27/2016 CLINICAL DATA:  Abdominal pain and diarrhea. EXAM: CT ABDOMEN AND PELVIS WITH CONTRAST TECHNIQUE: Multidetector CT imaging of the abdomen and pelvis was performed using the standard protocol following bolus administration of intravenous  contrast. CONTRAST:  63mL ISOVUE-300 IOPAMIDOL (ISOVUE-300) INJECTION 61% COMPARISON:  08/21/2014 FINDINGS: Lower chest and abdominal wall: No acute finding.  Emphysematous changes. Hepatobiliary: Cirrhotic liver without visible mass lesion. There is portal hypertension with recannulated umbilical vein, small ascites, splenomegaly, and severe hemorrhoids.Cholecystectomy with normal common bile duct diameter Pancreas: Unremarkable. Spleen: Enlarged in the setting of portal hypertension. Adrenals/Urinary Tract: Negative adrenals. Small bilateral hilar calcifications likely combination of vascular and urinary. Bilateral renal cysts which appear simple, measuring up to 7.3 cm exophytic from the left upper pole. Unremarkable bladder. Stomach/Bowel: Ileocolic anastomosis on the right. There is pan colonic circumferential thickening with intermittent mesenteric fat edema. No bowel obstruction or perforation. Severe internal hemorrhoids. High-density structures within the stomach lumen are likely ingested. Reproductive:Incidental 2 cm simple appearing right ovarian cyst. Vascular/Lymphatic: Sequela of portal hypertension as described above. 31 mm fusiform abdominal aortic aneurysm, of unlikely clinical significance given Monique comorbidities. Extensive atherosclerotic calcification. No mass or adenopathy. Other: Small ascites. Musculoskeletal: No acute abnormalities. IMPRESSION: 1. Pancolitis, likely infectious.  Consider C difficile colitis. 2. Cirrhosis with portal hypotension. 3.  Emphysema. (ICD10-J43.9) 4. Aortic Atherosclerosis (ICD10-170.0) with 31 mm diameter abdominal aortic aneurysm. Electronically Signed   By: Monte Fantasia M.D.   On: 05/27/2016 01:22    EKG: Independently reviewed. Normal sinus rhythm.  Assessment/Plan Principal Problem:   Acute GI bleeding Active Problems:   Generalized convulsive epilepsy ([place])   COPD with chronic bronchitis ([place])   Angiodysplasia of intestine   Diabetes mellitus, type 2 ([place])   Acute encephalopathy   Hepatic cirrhosis ([place])    1. Acute GI bleeding with history of portal hypertension and cirrhosis - source not  clear but however since Monique Fleming has cirrhosis will keep Monique Fleming on octreotide and Protonix. I have ordered 2 units of packed red blood cell transfusion. Follow CBC after transfusion. GI to see in consult. Antibiotics. 2. Diffuse pan colitis with diarrhea - check stool studies. Monique Fleming is on [place] and Flagyl. 3. Acute encephalopathy probably secondary to hepatic encephalopathy - increase the dose of lactulose. Continue Xifaxan. Monique Fleming is on antibiotic. Probably precipitated by GI bleed. 4. COPD - not breathing. Continue inhalers. 5. Diabetes mellitus type 2 - placed on sliding scale coverage. 6. Acute blood loss anemia - follow CBC after transfusion.   DVT prophylaxis: SCDs. Code Status: DO NOT RESUSCITATE.  Family Communication: Monique Fleming.  Disposition Plan: Home.  Consults called: Copywriter, advertising by ER physician.  Admission status: Observation.    Rise Patience MD [facility] Pager [phone].  If 7PM-7AM, please contact night-coverage [email or web address] Password [email or web address]  05/27/2016, 3:07 AM

## 2016-05-27 NOTE — Progress Notes (Signed)
Patient seen and examined with daughter in room. She is not oriented to time, report diffuse abdominal pain, + heart murmur best heard at left upper sternal border (per daughter this is chronic). GI consulted.  Per daughter, patient has been living by herself with daughter live 26mins away.

## 2016-05-27 NOTE — Discharge Instructions (Signed)
Metformin and IV Contrast Studies °For some X-ray exams, a contrast dye is used. Contrast dye is a type of medicine used to make the X-ray image clearer. The contrast dye is given to the patient through a vein (intravenously). If you need to have this type of X-ray exam and you take a medication called metformin, your caregiver may have you stop taking metformin before the exam.  °LACTIC ACIDOSIS °In rare cases, a serious medical condition called lactic acidosis can develop in people who take metformin and receive contrast dye. The following conditions can increase the risk of this complication:  °· Kidney failure. °· Liver problems. °· Certain types of heart problems such as: °¨ Heart failure. °¨ Heart attack. °¨ Heart infection. °¨ Heart valve problems. °· Alcohol abuse. °If left untreated, lactic acidosis can lead to coma.  °SYMPTOMS OF LACTIC ACIDOSIS °Symptoms of lactic acidosis can include: °· Rapid breathing (hyperventilation). °· Neurologic symptoms such as: °¨ Headaches. °¨ Confusion. °¨ Dizziness. °· Excessive sweating. °· Feeling sick to your stomach (nauseous) or throwing up (vomiting). °AFTER THE X-RAY EXAM °· Stay well-hydrated. Drink fluids as instructed by your caregiver. °· If you have a risk of developing lactic acidosis, blood tests may be done to make sure your kidney function is okay. °· Metformin is usually stopped for 48 hours after the X-ray exam. Ask your caregiver when you can start taking metformin again. °SEEK MEDICAL CARE IF:  °· You have shortness of breath or difficulty breathing. °· You develop a headache that does not go away. °· You have nausea or vomiting. °· You urinate more than normal. °· You develop a skin rash and have: °¨ Redness. °¨ Swelling. °¨ Itching. °  °This information is not intended to replace advice given to you by your health care provider. Make sure you discuss any questions you have with your health care provider. °  °Document Released: 09/09/2009 Document  Revised: 02/05/2015 Document Reviewed: 09/09/2009 °Elsevier Interactive Patient Education ©2016 [employer] ° °

## 2016-05-28 ENCOUNTER — Inpatient Hospital Stay (HOSPITAL_COMMUNITY): Payer: Medicare Other

## 2016-05-28 ENCOUNTER — Encounter (HOSPITAL_COMMUNITY): Payer: Self-pay | Admitting: *Deleted

## 2016-05-28 DIAGNOSIS — K922 Gastrointestinal hemorrhage, unspecified: Secondary | ICD-10-CM

## 2016-05-28 LAB — TYPE AND SCREEN
ABO/RH(D): O POS
ANTIBODY SCREEN: NEGATIVE
UNIT DIVISION: 0
Unit division: 0

## 2016-05-28 LAB — LACTIC ACID, PLASMA
LACTIC ACID, VENOUS: 1.3 mmol/L (ref 0.5–1.9)
Lactic Acid, Venous: 1.8 mmol/L (ref 0.5–1.9)

## 2016-05-28 LAB — BASIC METABOLIC PANEL
ANION GAP: 5 (ref 5–15)
Anion gap: 6 (ref 5–15)
BUN: 78 mg/dL — AB (ref 6–20)
BUN: 63 mg/dL — ABNORMAL HIGH (ref 6–20)
CALCIUM: 7.7 mg/dL — AB (ref 8.9–10.3)
CHLORIDE: 115 mmol/L — AB (ref 101–111)
CHLORIDE: 114 mmol/L — AB (ref 101–111)
CO2: 20 mmol/L — ABNORMAL LOW (ref 22–32)
CO2: 21 mmol/L — ABNORMAL LOW (ref 22–32)
CREATININE: 1.15 mg/dL — AB (ref 0.44–1.00)
Calcium: 7.8 mg/dL — ABNORMAL LOW (ref 8.9–10.3)
Creatinine, Ser: 1.2 mg/dL — ABNORMAL HIGH (ref 0.44–1.00)
GFR calc non Af Amer: 40 mL/min — ABNORMAL LOW (ref >60)
GFR calc non Af Amer: 42 mL/min — ABNORMAL LOW (ref >60)
GFR, EST AFRICAN AMERICAN: 47 mL/min — AB (ref >60)
GFR, EST AFRICAN AMERICAN: 49 mL/min — AB (ref >60)
Glucose, Bld: 144 mg/dL — ABNORMAL HIGH (ref 65–99)
Glucose, Bld: 241 mg/dL — ABNORMAL HIGH (ref 65–99)
POTASSIUM: 3.6 mmol/L (ref 3.5–5.1)
Potassium: 3.7 mmol/L (ref 3.5–5.1)
SODIUM: 141 mmol/L (ref 135–145)
SODIUM: 140 mmol/L (ref 135–145)

## 2016-05-28 LAB — BLOOD GAS, ARTERIAL
Acid-base deficit: 2.8 mmol/L — ABNORMAL HIGH (ref 0.0–2.0)
Bicarbonate: 21.1 mEq/L (ref 20.0–24.0)
Drawn by: 345601
FIO2: 21
O2 SAT: 94.2 %
PH ART: 7.41 (ref 7.350–7.450)
PO2 ART: 71.4 mmHg — AB (ref 80.0–100.0)
Patient temperature: 98.6
TCO2: 22.1 mmol/L (ref 0–100)
pCO2 arterial: 33.9 mmHg — ABNORMAL LOW (ref 35.0–45.0)

## 2016-05-28 LAB — GLUCOSE, CAPILLARY
GLUCOSE-CAPILLARY: 151 mg/dL — AB (ref 65–99)
GLUCOSE-CAPILLARY: 130 mg/dL — AB (ref 65–99)
GLUCOSE-CAPILLARY: 157 mg/dL — AB (ref 65–99)
GLUCOSE-CAPILLARY: 156 mg/dL — AB (ref 65–99)
Glucose-Capillary: 171 mg/dL — ABNORMAL HIGH (ref 65–99)

## 2016-05-28 LAB — AMMONIA: AMMONIA: 31 umol/L (ref 9–35)

## 2016-05-28 LAB — CBC
HCT: 27.1 % — ABNORMAL LOW (ref 36.0–46.0)
HCT: 25.4 % — ABNORMAL LOW (ref 36.0–46.0)
HEMOGLOBIN: 8.6 g/dL — AB (ref 12.0–15.0)
HEMOGLOBIN: 8.1 g/dL — AB (ref 12.0–15.0)
MCH: 29.1 pg (ref 26.0–34.0)
MCH: 29.6 pg (ref 26.0–34.0)
MCHC: 31.7 g/dL (ref 30.0–36.0)
MCHC: 31.9 g/dL (ref 30.0–36.0)
MCV: 91.6 fL (ref 78.0–100.0)
MCV: 92.7 fL (ref 78.0–100.0)
Platelets: 165 10*3/uL (ref 150–400)
Platelets: 131 10*3/uL — ABNORMAL LOW (ref 150–400)
RBC: 2.96 MIL/uL — AB (ref 3.87–5.11)
RBC: 2.74 MIL/uL — AB (ref 3.87–5.11)
RDW: 16.8 % — ABNORMAL HIGH (ref 11.5–15.5)
RDW: 17.5 % — ABNORMAL HIGH (ref 11.5–15.5)
WBC: 25.8 10*3/uL — ABNORMAL HIGH (ref 4.0–10.5)
WBC: 15.3 10*3/uL — AB (ref 4.0–10.5)

## 2016-05-28 LAB — URINE CULTURE: Culture: NO GROWTH

## 2016-05-28 MED ORDER — VANCOMYCIN 50 MG/ML ORAL SOLUTION
250.0000 mg | Freq: Four times a day (QID) | ORAL | Status: DC
  Administered 2016-05-28 – 2016-06-04 (×26): 250 mg via ORAL
  Filled 2016-05-28 (×30): qty 5

## 2016-05-28 NOTE — Progress Notes (Addendum)
PROGRESS NOTE  Monique Fleming [id number] DOB: September 18, 1929 DOA: 05/26/2016 PCP: Penni Homans, MD  HPI/Recap of past 24 hours:  Patient is sleeping during encounter, per RN, patient is more alert and interactive today, daughter report patient mentation almost back to normal during lunch today  Addendum: patient continue to be sleepy not waking up this afternoon, stat CT head/abg/basic labs ordered  Assessment/Plan: Principal Problem:   Acute GI bleeding Active Problems:   Generalized convulsive epilepsy ([place])   COPD with chronic bronchitis ([place])   Angiodysplasia of intestine   Diabetes mellitus, type 2 ([place])   Acute encephalopathy   Hepatic cirrhosis ([place])   1. Acute GI bleeding /Acute blood loss anemiashe was briefly on octreotide initially on presentation due to history of portal hypertension and cirrhosis - gi bleed likely from colitis, octreotide discontinued,  S/p 2units of prbc transfusion. GI consulted 2. Diffuse pan colitis with diarrhea - negative stool studies. GI recommended treat for C diff due to high suspicion for c diff colitis 3. Acute encephalopathy probably secondary to hepatic encephalopathy/ and metabolic encephalopathy from colitis - increase the dose of lactulose.  Xifaxan stopped per GI recommendation due to concerning for c diff.  4. COPD - no wheezing, on room air, Continue inhalers. 5. Diabetes mellitus type 2 - noninsulin dependent, placed on sliding scale coverage.    DVT prophylaxis: SCDs. Code Status: DO NOT RESUSCITATE.  Family Communication: Patient's daughter.  Disposition Plan: Home.  Consults called: Copywriter, advertising by ER physician.    Procedures:  none  Antibiotics:  Oral vanc/ iv flagyl   Objective: BP (!) 132/44 (BP Location: Right Arm)   Pulse 99   Temp 98.2 F (36.8 C)   Resp 20   Ht 5\' 4"  (1.626 m)   Wt 55.7 kg (122 lb 11.2 oz)   SpO2 95%   BMI 21.06 kg/m   Intake/Output Summary (Last 24 hours) at 05/28/16  1326 Last data filed at 05/28/16 0541  Gross per 24 hour  Intake          2061.67 ml  Output                0 ml  Net          2061.67 ml   Filed Weights   05/27/16 0144 05/28/16 0500  Weight: 56.2 kg (123 lb 12.8 oz) 55.7 kg (122 lb 11.2 oz)    Exam:   General:  NAD, sleepy  Cardiovascular: RRR  Respiratory: CTABL  Abdomen: Soft/ND/NT, positive BS  Musculoskeletal: No Edema  Neuro: sleeping  Data Reviewed: Basic Metabolic Panel:  Recent Labs Lab 05/26/16 2200 05/27/16 0212 05/27/16 1740 05/28/16 0731  NA 138 138 142 141  K 5.7* 5.4* 4.6 3.6  CL 107 110 115* 115*  CO2 22 17* 21* 20*  GLUCOSE 155* 176* 218* 144*  BUN 68* 69* 80* 78*  CREATININE 1.43* 1.45* 1.34* 1.20*  CALCIUM 8.8* 8.2* 8.3* 7.8*  MG  --   --  1.9  --    Liver Function Tests:  Recent Labs Lab 05/26/16 2200  AST 45*  ALT 20  ALKPHOS 75  BILITOT 0.9  PROT 5.2*  ALBUMIN 2.4*   No results for input(s): LIPASE, AMYLASE in the last 168 hours.  Recent Labs Lab 05/27/16 0100  AMMONIA 39*   CBC:  Recent Labs Lab 05/26/16 2200 05/27/16 0212 05/27/16 0954 05/27/16 1400 05/27/16 1740 05/28/16 0102  WBC 25.2* 21.1* 23.7*  --  23.3* 25.8*  NEUTROABS 19.4*  --   --   --   --   --  HGB 6.8* 5.7* 9.4* 9.7* 8.4* 8.6*  HCT 22.3* 19.0* 30.5* 31.7* 27.1* 27.1*  MCV 94.9 96.4 93.0  --  91.9 91.6  PLT 255 213 187  --  181 165   Cardiac Enzymes:   No results for input(s): CKTOTAL, CKMB, CKMBINDEX, TROPONINI in the last 168 hours. BNP (last 3 results)  Recent Labs  02/27/16 1520  BNP 535.4*    ProBNP (last 3 results) No results for input(s): PROBNP in the last 8760 hours.  CBG:  Recent Labs Lab 05/27/16 2027 05/27/16 2346 05/28/16 0422 05/28/16 0754 05/28/16 1208  GLUCAP 167* 105* 151* 130* 171*    Recent Results (from the past 240 hour(s))  C difficile quick scan w PCR reflex     Status: None   Collection Time: 05/26/16 10:40 PM  Result Value Ref Range Status   C  Diff antigen NEGATIVE NEGATIVE Final   C Diff toxin NEGATIVE NEGATIVE Final   C Diff interpretation No C. difficile detected.  Final  Urine culture     Status: None   Collection Time: 05/26/16 10:45 PM  Result Value Ref Range Status   Specimen Description URINE, RANDOM  Final   Special Requests NONE  Final   Culture NO GROWTH  Final   Report Status 05/28/2016 FINAL  Final  Gastrointestinal Panel by PCR , Stool     Status: None   Collection Time: 05/27/16  3:07 AM  Result Value Ref Range Status   Campylobacter species NOT DETECTED NOT DETECTED Final   Plesimonas shigelloides NOT DETECTED NOT DETECTED Final   Salmonella species NOT DETECTED NOT DETECTED Final   Yersinia enterocolitica NOT DETECTED NOT DETECTED Final   Vibrio species NOT DETECTED NOT DETECTED Final   Vibrio cholerae NOT DETECTED NOT DETECTED Final   Enteroaggregative E coli (EAEC) NOT DETECTED NOT DETECTED Final   Enteropathogenic E coli (EPEC) NOT DETECTED NOT DETECTED Final   Enterotoxigenic E coli (ETEC) NOT DETECTED NOT DETECTED Final   Shiga like toxin producing E coli (STEC) NOT DETECTED NOT DETECTED Final   E. coli O157 NOT DETECTED NOT DETECTED Final   Shigella/Enteroinvasive E coli (EIEC) NOT DETECTED NOT DETECTED Final   Cryptosporidium NOT DETECTED NOT DETECTED Final   Cyclospora cayetanensis NOT DETECTED NOT DETECTED Final   Entamoeba histolytica NOT DETECTED NOT DETECTED Final   Giardia lamblia NOT DETECTED NOT DETECTED Final   Adenovirus F40/41 NOT DETECTED NOT DETECTED Final   Astrovirus NOT DETECTED NOT DETECTED Final   Norovirus GI/GII NOT DETECTED NOT DETECTED Final   Rotavirus A NOT DETECTED NOT DETECTED Final   Sapovirus (I, II, IV, and V) NOT DETECTED NOT DETECTED Final     Studies: No results found.  Scheduled Meds: . arformoterol  15 mcg Nebulization BID  . budesonide  0.25 mg Nebulization BID  . desvenlafaxine  100 mg Oral Daily  . fluticasone  2 spray Each Nare Daily  . insulin aspart   0-9 Units Subcutaneous Q4H  . lactulose  30 g Oral TID  . magnesium oxide  400 mg Oral BID  . metronidazole  500 mg Intravenous Q8H  . montelukast  10 mg Oral QHS  . [START ON 05/30/2016] pantoprazole  40 mg Intravenous Q12H  . phenytoin  200 mg Oral QHS  . vancomycin  250 mg Oral Q6H    Continuous Infusions: . pantoprozole (PROTONIX) infusion 8 mg/hr (05/28/16 1026)     Time spent: 42mins  Jahnay Lantier MD, PhD  [facility] Pager [phone]. If 7PM-7AM,  please contact night-coverage at [email or web address], password [facility] 05/28/2016, 1:26 PM  LOS: 1 day

## 2016-05-28 NOTE — Consult Note (Signed)
   [facility] CM Inpatient Consult   05/28/2016  LEVETTA BOGNAR 04-19-1929 [id number]  Referral received for post hospital follow up.  Patient has been admitted 3 times in the past 6 months. According to the chart review the patient is an 82 y.o. female with cirrhosis of the liver, COPD, diabetes mellitus was brought to the ER after patient was found to be increasingly confused by patient's daughter. Patient last 2 days [prior to admission 05/26/16] also has been having some abdominal discomfort with multiple episodes of diarrhea. In the ER patient is oriented to her name only. Ammonia levels are mildly elevated. CT of abdomen and pelvis shows pancolitis. Hemoglobin has dropped from 9-6. Marland Kitchen Patient is being admitted for hepatic encephalopathy and GI bleed. In addition CT also shows colitis.  Met with the patient and daughter [name] Spink] at the bedside regarding [place] Management program for post hospital follow up and resources.  Daughter states she would not mind have the extra follow up when the patient returns home.  Current discharge plan is unknown as the patient is asleep during this visit. Daughter states that she is a Marine scientist and her sister is a Education officer, museum but they both work as well.  The patient was living independently prior to admission. Consent form signed.  Daughter request that the family is called for appointments as the patient is very hard of hearing and does not always answer the telephone.    Patient evaluated for community based chronic disease management services with [place] [facility] as a benefit of patient's [employer]. Spoke with patient at bedside to explain [place] Management services.  Patient will receive post hospital discharge call and will be evaluated for monthly home visits for assessments and disease process education.  Left contact information and [facility] literature at bedside. Will make Inpatient Case Manager aware that [place] Management following. Of note,  [facility] Management services does not replace or interfere with any services that are arranged by inpatient case management or social work.  For additional questions or referrals please contact:    Natividad Brood, RN BSN [facility] Liaison  [phone] business mobile phone Toll free office [phone]

## 2016-05-28 NOTE — Progress Notes (Signed)
Patient ID: Monique Fleming, female   DOB: Mar 26, 1929, 82 y.o.   MRN: [id number]    Progress Note   Subjective  Sleeping peacefully, did not wake to exam Nurse reports a bit less confused when awake Continues with diarrhea multiple small volume stringy dark stools    Objective   Vital signs in last 24 hours: Temp:  [97.6 F (36.4 C)-99.5 F (37.5 C)] 98.2 F (36.8 C) (08/24 0424) Pulse Rate:  [55-99] 99 (08/24 0424) Resp:  [16-20] 20 (08/24 0424) BP: (124-134)/(44-117) 132/44 (08/24 0424) SpO2:  [86 %-98 %] 95 % (08/24 0838) Weight:  [122 lb 11.2 oz (55.7 kg)] 122 lb 11.2 oz (55.7 kg) (08/24 0500) Last BM Date: 05/28/16 General: elderly white female in NAD-sleeping  Heart:  Regular rate and rhythm; no murmurs Lungs: Respirations even and unlabored, lungs CTA bilaterally Abdomen:  Soft, nontender and nondistended. BS decreased Extremities:  Without edema. Neurologic:  Asleep did not wake to exam or voice Intake/Output from previous day: 08/23 0701 - 08/24 0700 In: 2381.7 [P.O.:720; I.V.:1041.7; Blood:320; IV Piggyback:300] Out: 700 [Stool:700] Intake/Output this shift: No intake/output data recorded.  Lab Results:  Recent Labs  05/27/16 0954 05/27/16 1400 05/27/16 1740 05/28/16 0102  WBC 23.7*  --  23.3* 25.8*  HGB 9.4* 9.7* 8.4* 8.6*  HCT 30.5* 31.7* 27.1* 27.1*  PLT 187  --  181 165   BMET  Recent Labs  05/27/16 0212 05/27/16 1740 05/28/16 0731  NA 138 142 141  K 5.4* 4.6 3.6  CL 110 115* 115*  CO2 17* 21* 20*  GLUCOSE 176* 218* 144*  BUN 69* 80* 78*  CREATININE 1.45* 1.34* 1.20*  CALCIUM 8.2* 8.3* 7.8*   LFT  Recent Labs  05/26/16 2200  PROT 5.2*  ALBUMIN 2.4*  AST 45*  ALT 20  ALKPHOS 75  BILITOT 0.9   PT/INR No results for input(s): LABPROT, INR in the last 72 hours.  Studies/Results: Dg Chest 2 View  Result Date: 05/26/2016 CLINICAL DATA:  Altered mental status. History of asthma and bronchitis. COPD. EXAM: CHEST  2 VIEW  COMPARISON:  Chest radiograph 05/01/2016 FINDINGS: Shallow lung inflation with diffusely increased pulmonary interstitial markings, similar prior study. Cardiomediastinal contours are within normal limits when allowing for AP technique. No focal airspace consolidation or pulmonary edema. No pneumothorax or sizable pleural effusion. IMPRESSION: No radiographic evidence of pneumonia. Electronically Signed   By: Ulyses Jarred M.D.   On: 05/26/2016 22:22   Ct Head Wo Contrast  Result Date: 05/27/2016 CLINICAL DATA:  82 y/o F; altered mental status with history of TIA and seizures. EXAM: CT HEAD WITHOUT CONTRAST TECHNIQUE: Contiguous axial images were obtained from the base of the skull through the vertex without intravenous contrast. COMPARISON:  None. FINDINGS: Brain: No evidence of acute infarction, hemorrhage, hydrocephalus, extra-axial collection or mass lesion/mass effect. Stable moderate parenchymal volume loss and chronic microvascular ischemic changes. Stable bilateral cerebellar and left occipital small chronic infarcts. Vascular: No hyperdense vessel. Extensive calcific atherosclerosis of cavernous internal carotid arteries and vertebral arteries. Skull: Negative. Sinuses/Orbits: Bilateral intra-ocular lens replacement. No acute finding. Other: Bilateral high-riding jugular bulbs within the sigmoid plates. IMPRESSION: No acute intracranial abnormality. Stable parenchymal volume loss, chronic microvascular ischemic changes and small chronic infarcts. Electronically Signed   By: Kristine Garbe M.D.   On: 05/27/2016 01:17   Ct Abdomen Pelvis W Contrast  Result Date: 05/27/2016 CLINICAL DATA:  Abdominal pain and diarrhea. EXAM: CT ABDOMEN AND PELVIS WITH CONTRAST TECHNIQUE: Multidetector CT imaging  of the abdomen and pelvis was performed using the standard protocol following bolus administration of intravenous contrast. CONTRAST:  53mL ISOVUE-300 IOPAMIDOL (ISOVUE-300) INJECTION 61% COMPARISON:   08/21/2014 FINDINGS: Lower chest and abdominal wall: No acute finding. Emphysematous changes. Hepatobiliary: Cirrhotic liver without visible mass lesion. There is portal hypertension with recannulated umbilical vein, small ascites, splenomegaly, and severe hemorrhoids.Cholecystectomy with normal common bile duct diameter Pancreas: Unremarkable. Spleen: Enlarged in the setting of portal hypertension. Adrenals/Urinary Tract: Negative adrenals. Small bilateral hilar calcifications likely combination of vascular and urinary. Bilateral renal cysts which appear simple, measuring up to 7.3 cm exophytic from the left upper pole. Unremarkable bladder. Stomach/Bowel: Ileocolic anastomosis on the right. There is pan colonic circumferential thickening with intermittent mesenteric fat edema. No bowel obstruction or perforation. Severe internal hemorrhoids. High-density structures within the stomach lumen are likely ingested. Reproductive:Incidental 2 cm simple appearing right ovarian cyst. Vascular/Lymphatic: Sequela of portal hypertension as described above. 31 mm fusiform abdominal aortic aneurysm, of unlikely clinical significance given patient's comorbidities. Extensive atherosclerotic calcification. No mass or adenopathy. Other: Small ascites. Musculoskeletal: No acute abnormalities. IMPRESSION: 1. Pancolitis, likely infectious.  Consider C difficile colitis. 2. Cirrhosis with portal hypotension. 3.  Emphysema. (ICD10-J43.9) 4. Aortic Atherosclerosis (ICD10-170.0) with 31 mm diameter abdominal aortic aneurysm. Electronically Signed   By: Monte Fantasia M.D.   On: 05/27/2016 01:22       Assessment / Plan:    #1 82 yo female cirrhotic admitted with AMS,abd pain and diarrhea Pancolitis on CT (pt s/p right hemicolectomy) C diff quick screen negative, GI Path panel negative  Continues with diarrhea, no obvious blood and Hg stable Etiology not clear-still suspect infectious and treating for C diff as onset after  taking bactrim and on chronic Xifafan  Continue Vancomycin orally and IV flagyl - vanc was not ordered yesterday so started today, will hold xifaxan while on vanco Full liquids  #2 Leukocytosis - persisting #3 Hepatic encephalopathy- improving slowly- on lactulose - so diarrhea is multifactorial, also on chronic magnesium #4 DM #5 portal gastropathy   Principal Problem:   Acute GI bleeding Active Problems:   Generalized convulsive epilepsy ([place])   COPD with chronic bronchitis ([place])   Angiodysplasia of intestine   Diabetes mellitus, type 2 ([place])   Acute encephalopathy   Hepatic cirrhosis ([place])    LOS: 1 day   Amy Esterwood  05/28/2016, 9:07 AM     Attending physician's note   I have taken an interval history, reviewed the chart and examined the patient. I agree with the Advanced Practitioner's note, impression and recommendations. Diarrhea and presumed colitis based on CT findings. Diarrhea partially due to lactulose and magnesium. Suspected infectious diarrhea/colitis however stool studies are all negative. Continue vanco and flagyl for now. HE is improving slowly.   Lucio Edward, MD [facility] [phone] Mon-Fri 8a-5p [phone] after 5p, weekends, holidays

## 2016-05-29 ENCOUNTER — Inpatient Hospital Stay (HOSPITAL_COMMUNITY): Payer: Medicare Other

## 2016-05-29 DIAGNOSIS — A09 Infectious gastroenteritis and colitis, unspecified: Secondary | ICD-10-CM

## 2016-05-29 DIAGNOSIS — R933 Abnormal findings on diagnostic imaging of other parts of digestive tract: Secondary | ICD-10-CM

## 2016-05-29 LAB — BASIC METABOLIC PANEL
ANION GAP: 7 (ref 5–15)
BUN: 51 mg/dL — ABNORMAL HIGH (ref 6–20)
CO2: 21 mmol/L — AB (ref 22–32)
Calcium: 7.8 mg/dL — ABNORMAL LOW (ref 8.9–10.3)
Chloride: 115 mmol/L — ABNORMAL HIGH (ref 101–111)
Creatinine, Ser: 1.05 mg/dL — ABNORMAL HIGH (ref 0.44–1.00)
GFR calc Af Amer: 55 mL/min — ABNORMAL LOW (ref >60)
GFR calc non Af Amer: 47 mL/min — ABNORMAL LOW (ref >60)
GLUCOSE: 144 mg/dL — AB (ref 65–99)
POTASSIUM: 3.3 mmol/L — AB (ref 3.5–5.1)
Sodium: 143 mmol/L (ref 135–145)

## 2016-05-29 LAB — GLUCOSE, CAPILLARY
GLUCOSE-CAPILLARY: 163 mg/dL — AB (ref 65–99)
GLUCOSE-CAPILLARY: 164 mg/dL — AB (ref 65–99)
GLUCOSE-CAPILLARY: 234 mg/dL — AB (ref 65–99)
GLUCOSE-CAPILLARY: 96 mg/dL (ref 65–99)
Glucose-Capillary: 123 mg/dL — ABNORMAL HIGH (ref 65–99)
Glucose-Capillary: 140 mg/dL — ABNORMAL HIGH (ref 65–99)
Glucose-Capillary: 142 mg/dL — ABNORMAL HIGH (ref 65–99)

## 2016-05-29 LAB — AMMONIA: Ammonia: 32 umol/L (ref 9–35)

## 2016-05-29 LAB — CBC
HEMATOCRIT: 26.6 % — AB (ref 36.0–46.0)
Hemoglobin: 8.2 g/dL — ABNORMAL LOW (ref 12.0–15.0)
MCH: 29 pg (ref 26.0–34.0)
MCHC: 30.8 g/dL (ref 30.0–36.0)
MCV: 94 fL (ref 78.0–100.0)
Platelets: 147 10*3/uL — ABNORMAL LOW (ref 150–400)
RBC: 2.83 MIL/uL — AB (ref 3.87–5.11)
RDW: 17.8 % — ABNORMAL HIGH (ref 11.5–15.5)
WBC: 14.3 10*3/uL — AB (ref 4.0–10.5)

## 2016-05-29 LAB — PHENYTOIN LEVEL, FREE AND TOTAL
Phenytoin, Free: NOT DETECTED ug/mL (ref 1.0–2.0)
Phenytoin, Total: 3.5 ug/mL — ABNORMAL LOW (ref 10.0–20.0)

## 2016-05-29 LAB — MAGNESIUM: Magnesium: 1.7 mg/dL (ref 1.7–2.4)

## 2016-05-29 MED ORDER — ENSURE ENLIVE PO LIQD
237.0000 mL | Freq: Two times a day (BID) | ORAL | Status: DC
  Administered 2016-05-30 – 2016-06-04 (×8): 237 mL via ORAL

## 2016-05-29 MED ORDER — POTASSIUM CHLORIDE CRYS ER 20 MEQ PO TBCR
40.0000 meq | EXTENDED_RELEASE_TABLET | Freq: Once | ORAL | Status: AC
  Administered 2016-05-29: 40 meq via ORAL
  Filled 2016-05-29: qty 2

## 2016-05-29 MED ORDER — LACTULOSE 10 GM/15ML PO SOLN
30.0000 g | Freq: Four times a day (QID) | ORAL | Status: DC
  Administered 2016-05-29 – 2016-05-31 (×8): 30 g via ORAL
  Filled 2016-05-29 (×9): qty 45

## 2016-05-29 NOTE — Progress Notes (Signed)
PROGRESS NOTE  Monique Fleming [id number] DOB: May 26, 1929 DOA: 05/26/2016 PCP: Penni Homans, MD  HPI/Recap of past 24 hours:  Labs continue to improve, vital stable, but patient is not arousable to sternal rubs, Patient seems in deep  Sleep during my exam,  per RN, patient is alert and interactive this am,   Assessment/Plan: Principal Problem:   Acute GI bleeding Active Problems:   Generalized convulsive epilepsy ([place])   COPD with chronic bronchitis ([place])   Angiodysplasia of intestine   Diabetes mellitus, type 2 ([place])   Acute encephalopathy   Hepatic cirrhosis ([place])   1. Acute GI bleeding /Acute blood loss anemiashe was briefly on octreotide initially on presentation due to history of portal hypertension and cirrhosis - gi bleed likely from colitis, octreotide discontinued,  S/p 2units of prbc transfusion. GI consulted 2. Diffuse pan colitis with diarrhea - negative stool studies. GI recommended treat for C diff due to high suspicion for c diff colitis 3. Acute encephalopathy probably secondary to hepatic encephalopathy/ and metabolic encephalopathy from colitis - increase the dose of lactulose.  Xifaxan stopped per GI recommendation due to concerning for c diff.  Patient is very drowsy, not arousable to sternal rubs on 8/24 pm, per RN patient was awake on 8/25am, then not arousable 8/25 pm, stat CT head/abg unremarkable, ammonia better, all labs continue to improve, will get MRI brain/b12/folate/rpr/hiv/eeg, dilantin level pending 4. COPD - no wheezing, on room air, Continue inhalers. 5. Diabetes mellitus type 2 - noninsulin dependent, placed on sliding scale coverage.    DVT prophylaxis: SCDs. Code Status: DO NOT RESUSCITATE.  Family Communication: Patient's daughter in room.  Disposition Plan: Home.  Consults called: Copywriter, advertising by ER physician.    Procedures:  none  Antibiotics:  Oral vanc/ iv flagyl   Objective: BP (!) 130/39 (BP Location: Left Arm)    Pulse 89   Temp 97.7 F (36.5 C) (Axillary)   Resp 16   Ht 5\' 4"  (1.626 m)   Wt 53.3 kg (117 lb 6.4 oz)   SpO2 95%   BMI 20.15 kg/m   Intake/Output Summary (Last 24 hours) at 05/29/16 1643 Last data filed at 05/29/16 0910  Gross per 24 hour  Intake           679.58 ml  Output                0 ml  Net           679.58 ml   Filed Weights   05/27/16 0144 05/28/16 0500 05/29/16 0556  Weight: 56.2 kg (123 lb 12.8 oz) 55.7 kg (122 lb 11.2 oz) 53.3 kg (117 lb 6.4 oz)    Exam:   General:  NAD, sleepy  Cardiovascular: RRR  Respiratory: CTABL  Abdomen: Soft/ND/NT, positive BS  Musculoskeletal: No Edema  Neuro: sleeping  Data Reviewed: Basic Metabolic Panel:  Recent Labs Lab 05/27/16 0212 05/27/16 1740 05/28/16 0731 05/28/16 1913 05/29/16 0422  NA 138 142 141 140 143  K 5.4* 4.6 3.6 3.7 3.3*  CL 110 115* 115* 114* 115*  CO2 17* 21* 20* 21* 21*  GLUCOSE 176* 218* 144* 241* 144*  BUN 69* 80* 78* 63* 51*  CREATININE 1.45* 1.34* 1.20* 1.15* 1.05*  CALCIUM 8.2* 8.3* 7.8* 7.7* 7.8*  MG  --  1.9  --   --  1.7   Liver Function Tests:  Recent Labs Lab 05/26/16 2200  AST 45*  ALT 20  ALKPHOS 75  BILITOT 0.9  PROT  5.2*  ALBUMIN 2.4*   No results for input(s): LIPASE, AMYLASE in the last 168 hours.  Recent Labs Lab 05/27/16 0100 05/28/16 1912 05/29/16 0422  AMMONIA 39* 31 32   CBC:  Recent Labs Lab 05/26/16 2200  05/27/16 0954 05/27/16 1400 05/27/16 1740 05/28/16 0102 05/28/16 1913 05/29/16 0422  WBC 25.2*  < > 23.7*  --  23.3* 25.8* 15.3* 14.3*  NEUTROABS 19.4*  --   --   --   --   --   --   --   HGB 6.8*  < > 9.4* 9.7* 8.4* 8.6* 8.1* 8.2*  HCT 22.3*  < > 30.5* 31.7* 27.1* 27.1* 25.4* 26.6*  MCV 94.9  < > 93.0  --  91.9 91.6 92.7 94.0  PLT 255  < > 187  --  181 165 131* 147*  < > = values in this interval not displayed. Cardiac Enzymes:   No results for input(s): CKTOTAL, CKMB, CKMBINDEX, TROPONINI in the last 168 hours. BNP (last 3  results)  Recent Labs  02/27/16 1520  BNP 535.4*    ProBNP (last 3 results) No results for input(s): PROBNP in the last 8760 hours.  CBG:  Recent Labs Lab 05/28/16 2113 05/29/16 0005 05/29/16 0422 05/29/16 0744 05/29/16 1200  GLUCAP 156* 96 163* 140* 164*    Recent Results (from the past 240 hour(s))  C difficile quick scan w PCR reflex     Status: None   Collection Time: 05/26/16 10:40 PM  Result Value Ref Range Status   C Diff antigen NEGATIVE NEGATIVE Final   C Diff toxin NEGATIVE NEGATIVE Final   C Diff interpretation No C. difficile detected.  Final  Urine culture     Status: None   Collection Time: 05/26/16 10:45 PM  Result Value Ref Range Status   Specimen Description URINE, RANDOM  Final   Special Requests NONE  Final   Culture NO GROWTH  Final   Report Status 05/28/2016 FINAL  Final  Blood culture (routine x 2)     Status: None (Preliminary result)   Collection Time: 05/26/16 11:30 PM  Result Value Ref Range Status   Specimen Description BLOOD LEFT HAND  Final   Special Requests IN PEDIATRIC BOTTLE 3CC  Final   Culture NO GROWTH 1 DAY  Final   Report Status PENDING  Incomplete  Blood culture (routine x 2)     Status: None (Preliminary result)   Collection Time: 05/26/16 11:35 PM  Result Value Ref Range Status   Specimen Description BLOOD RIGHT HAND  Final   Special Requests BOTTLES DRAWN AEROBIC AND ANAEROBIC 5CC  Final   Culture NO GROWTH 1 DAY  Final   Report Status PENDING  Incomplete  Gastrointestinal Panel by PCR , Stool     Status: None   Collection Time: 05/27/16  3:07 AM  Result Value Ref Range Status   Campylobacter species NOT DETECTED NOT DETECTED Final   Plesimonas shigelloides NOT DETECTED NOT DETECTED Final   Salmonella species NOT DETECTED NOT DETECTED Final   Yersinia enterocolitica NOT DETECTED NOT DETECTED Final   Vibrio species NOT DETECTED NOT DETECTED Final   Vibrio cholerae NOT DETECTED NOT DETECTED Final   Enteroaggregative E  coli (EAEC) NOT DETECTED NOT DETECTED Final   Enteropathogenic E coli (EPEC) NOT DETECTED NOT DETECTED Final   Enterotoxigenic E coli (ETEC) NOT DETECTED NOT DETECTED Final   Shiga like toxin producing E coli (STEC) NOT DETECTED NOT DETECTED Final   E. coli  O157 NOT DETECTED NOT DETECTED Final   Shigella/Enteroinvasive E coli (EIEC) NOT DETECTED NOT DETECTED Final   Cryptosporidium NOT DETECTED NOT DETECTED Final   Cyclospora cayetanensis NOT DETECTED NOT DETECTED Final   Entamoeba histolytica NOT DETECTED NOT DETECTED Final   Giardia lamblia NOT DETECTED NOT DETECTED Final   Adenovirus F40/41 NOT DETECTED NOT DETECTED Final   Astrovirus NOT DETECTED NOT DETECTED Final   Norovirus GI/GII NOT DETECTED NOT DETECTED Final   Rotavirus A NOT DETECTED NOT DETECTED Final   Sapovirus (I, II, IV, and V) NOT DETECTED NOT DETECTED Final     Studies: Ct Head Wo Contrast  Result Date: 05/28/2016 CLINICAL DATA:  Lethargy EXAM: CT HEAD WITHOUT CONTRAST TECHNIQUE: Contiguous axial images were obtained from the base of the skull through the vertex without intravenous contrast. COMPARISON:  05/27/2016 FINDINGS: Brain: No evidence of acute infarction, hemorrhage, hydrocephalus, extra-axial collection or mass lesion/mass effect. Old lacunar infarcts in the bilateral cerebellum. Tiny lacunar infarct in the right external capsule. Vascular: No hyperdense vessel or unexpected calcification. Skull: No evidence of calvarial fracture. Sinuses/Orbits: The visualized paranasal sinuses are essentially clear. The mastoid air cells are unopacified. Other: Global cortical atrophy.  No ventriculomegaly. Subcortical white matter and periventricular small vessel ischemic changes. Intracranial atherosclerosis. IMPRESSION: No evidence of acute intracranial abnormality. Atrophy with small vessel ischemic changes. Old bilateral cerebellar infarcts. Electronically Signed   By: Julian Hy M.D.   On: 05/28/2016 20:18   Dg Abd  Portable 1v  Result Date: 05/28/2016 CLINICAL DATA:  Abdominal distension EXAM: PORTABLE ABDOMEN - 1 VIEW COMPARISON:  CT scan May 27, 2016 FINDINGS: No evidence of bowel obstruction. No acute abnormalities are seen on this single supine frontal view of the abdomen and pelvis. IMPRESSION: No cause for distention identified. Electronically Signed   By: Dorise Bullion III M.D   On: 05/28/2016 18:50    Scheduled Meds: . arformoterol  15 mcg Nebulization BID  . budesonide  0.25 mg Nebulization BID  . desvenlafaxine  100 mg Oral Daily  . [START ON 05/30/2016] feeding supplement (ENSURE ENLIVE)  237 mL Oral BID BM  . fluticasone  2 spray Each Nare Daily  . insulin aspart  0-9 Units Subcutaneous Q4H  . lactulose  30 g Oral QID  . magnesium oxide  400 mg Oral BID  . metronidazole  500 mg Intravenous Q8H  . montelukast  10 mg Oral QHS  . [START ON 05/30/2016] pantoprazole  40 mg Intravenous Q12H  . phenytoin  200 mg Oral QHS  . vancomycin  250 mg Oral Q6H    Continuous Infusions: . pantoprozole (PROTONIX) infusion 8 mg/hr (05/29/16 1328)     Time spent: 34mins  Fairy Ashlock MD, PhD  [facility] Pager [phone]. If 7PM-7AM, please contact night-coverage at [email or web address], password [facility] 05/29/2016, 4:43 PM  LOS: 2 days

## 2016-05-29 NOTE — Progress Notes (Signed)
Initial Nutrition Assessment  DOCUMENTATION CODES:   Not applicable  INTERVENTION:   -Ensure Enlive po BID, each supplement provides 350 kcal and 20 grams of protein  NUTRITION DIAGNOSIS:   Inadequate oral intake related to lethargy/confusion as evidenced by meal completion < 25%.  GOAL:   Patient will meet greater than or equal to 90% of their needs  MONITOR:   PO intake, Supplement acceptance, Diet advancement, Labs, Weight trends, Skin, I & O's  REASON FOR ASSESSMENT:   Low [place]    ASSESSMENT:   Monique Fleming is a 82 y.o. female with cirrhosis of the liver, COPD, diabetes mellitus was brought to the ER after patient was found to be increasingly confused by patient's daughter. Patient last 2 days also has been having some abdominal discomfort with multiple episodes of diarrhea.  Pt admitted with acute GIB.   Pt unable to provide any meaningful hx; repeatedly stated "stay with me, don't leave me" to this RD during examination.   Meal completion records indicated poor oral intake; PO: 20-25%. Per staff, pt has been very lethargic.   Nutrition-Focused physical exam completed. Findings are mild fat depletion, mild muscle depletion, and no edema. It is difficult to determine rationale for fat and muscle depletion (malnutrition vs advanced age vs decline in mobility). Pt with abdominal distention.   Wt hx reviewed. Noted pt has experienced a 12% wt loss over the past year.  Unable to identify malnutrition at this time, given limited hx, however, pt is at high risk for malnutrition given poor oral intake, wt loss, and multiple medical issues. RD will add supplements in attempt to optimize intake.   Labs reviewed: K: 3.3.   Diet Order:  DIET SOFT Room service appropriate? Yes; Fluid consistency: Thin  Skin:  Reviewed, no issues  Last BM:  05/28/16  Height:   Ht Readings from Last 1 Encounters:  05/27/16 5\' 4"  (1.626 m)    Weight:   Wt Readings from Last 1  Encounters:  05/29/16 117 lb 6.4 oz (53.3 kg)    Ideal Body Weight:  54.5 kg  BMI:  Body mass index is 20.15 kg/m.  Estimated Nutritional Needs:   Kcal:  1300-1500  Protein:  65-80 grams  Fluid:  1.3-1.5 L  EDUCATION NEEDS:   No education needs identified at this time  Miron Marxen A. Jimmye Norman, RD, LDN, CDE Pager: [phone] After hours Pager: [phone]

## 2016-05-29 NOTE — Progress Notes (Signed)
Patient ID: Monique Fleming, female   DOB: Oct 12, 1928, 82 y.o.   MRN: [id number]    Progress Note   Subjective   Sleeping -arouses and says abdomen not hurting, but doesn't open eyes    Objective   Vital signs in last 24 hours: Temp:  [97.7 F (36.5 C)-98.7 F (37.1 C)] 97.7 F (36.5 C) (08/25 0558) Pulse Rate:  [60-89] 89 (08/25 0558) Resp:  [16-19] 16 (08/25 0558) BP: (128-130)/(31-40) 130/39 (08/25 0558) SpO2:  [94 %-99 %] 95 % (08/25 0558) Weight:  [117 lb 6.4 oz (53.3 kg)] 117 lb 6.4 oz (53.3 kg) (08/25 0556) Last BM Date: 05/28/16 General: elderly white female in NAD-sleepy, cooperative  Heart:  Regular rate and rhythm; no murmurs Lungs: Respirations even and unlabored, lungs CTA bilaterally Abdomen:  Soft, nontender and nondistended. Normal bowel sounds. Extremities:  Without edema. Neurologic:  Somnolent, no asterixis Psych:  Cooperative.   Intake/Output from previous day: 08/24 0701 - 08/25 0700 In: 1299.2 [P.O.:390; I.V.:609.2; IV Piggyback:300] Out: -  Intake/Output this shift: Total I/O In: 100 [P.O.:100] Out: -   Lab Results:  Recent Labs  05/28/16 0102 05/28/16 1913 05/29/16 0422  WBC 25.8* 15.3* 14.3*  HGB 8.6* 8.1* 8.2*  HCT 27.1* 25.4* 26.6*  PLT 165 131* 147*   BMET  Recent Labs  05/28/16 0731 05/28/16 1913 05/29/16 0422  NA 141 140 143  K 3.6 3.7 3.3*  CL 115* 114* 115*  CO2 20* 21* 21*  GLUCOSE 144* 241* 144*  BUN 78* 63* 51*  CREATININE 1.20* 1.15* 1.05*  CALCIUM 7.8* 7.7* 7.8*   LFT  Recent Labs  05/26/16 2200  PROT 5.2*  ALBUMIN 2.4*  AST 45*  ALT 20  ALKPHOS 75  BILITOT 0.9    Studies/Results: Ct Head Wo Contrast  Result Date: 05/28/2016 CLINICAL DATA:  Lethargy EXAM: CT HEAD WITHOUT CONTRAST TECHNIQUE: Contiguous axial images were obtained from the base of the skull through the vertex without intravenous contrast. COMPARISON:  05/27/2016 FINDINGS: Brain: No evidence of acute infarction, hemorrhage,  hydrocephalus, extra-axial collection or mass lesion/mass effect. Old lacunar infarcts in the bilateral cerebellum. Tiny lacunar infarct in the right external capsule. Vascular: No hyperdense vessel or unexpected calcification. Skull: No evidence of calvarial fracture. Sinuses/Orbits: The visualized paranasal sinuses are essentially clear. The mastoid air cells are unopacified. Other: Global cortical atrophy.  No ventriculomegaly. Subcortical white matter and periventricular small vessel ischemic changes. Intracranial atherosclerosis. IMPRESSION: No evidence of acute intracranial abnormality. Atrophy with small vessel ischemic changes. Old bilateral cerebellar infarcts. Electronically Signed   By: Julian Hy M.D.   On: 05/28/2016 20:18   Dg Abd Portable 1v  Result Date: 05/28/2016 CLINICAL DATA:  Abdominal distension EXAM: PORTABLE ABDOMEN - 1 VIEW COMPARISON:  CT scan May 27, 2016 FINDINGS: No evidence of bowel obstruction. No acute abnormalities are seen on this single supine frontal view of the abdomen and pelvis. IMPRESSION: No cause for distention identified. Electronically Signed   By: Dorise Bullion III M.D   On: 05/28/2016 18:50      Assessment / Plan:    #1 82 yo female with decompensated cirrhosis admitted with AMS, abdominal pain, diarrhea. Picture consistent with hepatic encephalopathy exacerbated by acute illness with a pancolitis on CT GI Path panel negative, and C. diff quick screen negative She had been on Bactrim, and Xifaxan within past 2 weeks - raising  concern for C. diff even though toxin negative WBC coming down on Vanco po, and IV flagyl Diarrhea  difficult to assess as also on chronulac #2 anemia - stable - no active bleeding - though has hx of portal gastropathy and right colon AVM's  Continue current regimen, will increase chronulac as still somnolent most of day Consider Flex with bx - but as afebrile, abdomen benign, WBC trending down-hold for now  Spoke to  pt's daughter by phone, and updated. Daughter would be in favor of EGD and flex if she needs to be sedated for procedure (last EGD 2013- portal gastropathy, no varices at that time)  Principal Problem:   Acute GI bleeding Active Problems:   Generalized convulsive epilepsy ([place])   COPD with chronic bronchitis ([place])   Angiodysplasia of intestine   Diabetes mellitus, type 2 ([place])   Acute encephalopathy   Hepatic cirrhosis ([place])    LOS: 2 days   Amy Esterwood  05/29/2016, 10:37 AM     Attending physician's note   I have taken an interval history, reviewed the chart and examined the patient. I agree with the Advanced Practitioner's note, impression and recommendations. Pt improving (no abdominal pain and WBC decreasing) on current mgmt. Infectious diarrhea/colitis, possibly C. diff. Difficult to assess diarrhea with lactulose. Increase lactulose for HE.   Lucio Edward, MD [facility] [phone] Mon-Fri 8a-5p [phone] after 5p, weekends, holidays

## 2016-05-29 NOTE — CV Procedure (Signed)
History: Monique Fleming is an 82 year old female with suspected hepatic encephalopathy  Sedation: None  Technique: This is a 21 channel routine scalp EEG performed at the bedside with bipolar and monopolar montages arranged in accordance to the international 10/20 system of electrode placement. One channel was dedicated to EKG recording.   Background: The background consists of generalized irregular delta and theta activity, though there is some frontally predominant beta as well. There is a posterior dominant rhythm of 4-5 Hz. There are frequent triphasic waves seen.   Photic stimulation: Physiologic driving is not performed  EEG Abnormalities: This is an abnormal EEG due to the presence of frequent interspersed triphasic waves and the presence of generalized delta and theta slowing  Clinical Interpretation:   1. This is an abnormal EEG due to the presence of generalized slowing consisting of theta and delta waves. The presence of generalized slowing is a nonspecific finding and is consistent with a moderate to severe degree of encephalopathy.   2. The recording is also notable for the presence of frequent randomly interspersed triphasic waves. The presence of triphasic waves are indicative of a metabolic encephalopathy typically as seen in hepatic and/or renal dysfunction.  3. No distinct rhythmic epileptic activity was noted.   Dr. Gerda Diss. Tasia Catchings, MD Neurohospitalist

## 2016-05-29 NOTE — Progress Notes (Signed)
EEG Completed; Results Pending  

## 2016-05-30 DIAGNOSIS — R410 Disorientation, unspecified: Secondary | ICD-10-CM

## 2016-05-30 DIAGNOSIS — R197 Diarrhea, unspecified: Secondary | ICD-10-CM

## 2016-05-30 DIAGNOSIS — K5520 Angiodysplasia of colon without hemorrhage: Secondary | ICD-10-CM

## 2016-05-30 LAB — CBC
HEMATOCRIT: 24.5 % — AB (ref 36.0–46.0)
HEMOGLOBIN: 7.5 g/dL — AB (ref 12.0–15.0)
MCH: 28.7 pg (ref 26.0–34.0)
MCHC: 30.6 g/dL (ref 30.0–36.0)
MCV: 93.9 fL (ref 78.0–100.0)
Platelets: 130 10*3/uL — ABNORMAL LOW (ref 150–400)
RBC: 2.61 MIL/uL — ABNORMAL LOW (ref 3.87–5.11)
RDW: 18.1 % — ABNORMAL HIGH (ref 11.5–15.5)
WBC: 10.3 10*3/uL (ref 4.0–10.5)

## 2016-05-30 LAB — GLUCOSE, CAPILLARY
GLUCOSE-CAPILLARY: 112 mg/dL — AB (ref 65–99)
GLUCOSE-CAPILLARY: 342 mg/dL — AB (ref 65–99)
Glucose-Capillary: 191 mg/dL — ABNORMAL HIGH (ref 65–99)
Glucose-Capillary: 259 mg/dL — ABNORMAL HIGH (ref 65–99)

## 2016-05-30 LAB — VITAMIN B12: Vitamin B-12: 1285 pg/mL — ABNORMAL HIGH (ref 180–914)

## 2016-05-30 LAB — COMPREHENSIVE METABOLIC PANEL
ALK PHOS: 59 U/L (ref 38–126)
ALT: 19 U/L (ref 14–54)
ANION GAP: 7 (ref 5–15)
AST: 42 U/L — ABNORMAL HIGH (ref 15–41)
Albumin: 2.1 g/dL — ABNORMAL LOW (ref 3.5–5.0)
BILIRUBIN TOTAL: 0.7 mg/dL (ref 0.3–1.2)
BUN: 26 mg/dL — ABNORMAL HIGH (ref 6–20)
CALCIUM: 7.6 mg/dL — AB (ref 8.9–10.3)
CO2: 21 mmol/L — ABNORMAL LOW (ref 22–32)
Chloride: 116 mmol/L — ABNORMAL HIGH (ref 101–111)
Creatinine, Ser: 0.84 mg/dL (ref 0.44–1.00)
Glucose, Bld: 133 mg/dL — ABNORMAL HIGH (ref 65–99)
Potassium: 3.4 mmol/L — ABNORMAL LOW (ref 3.5–5.1)
SODIUM: 144 mmol/L (ref 135–145)
TOTAL PROTEIN: 4.8 g/dL — AB (ref 6.5–8.1)

## 2016-05-30 LAB — RPR: RPR Ser Ql: NONREACTIVE

## 2016-05-30 LAB — AMMONIA: Ammonia: 41 umol/L — ABNORMAL HIGH (ref 9–35)

## 2016-05-30 LAB — MAGNESIUM: MAGNESIUM: 1.5 mg/dL — AB (ref 1.7–2.4)

## 2016-05-30 LAB — HIV ANTIBODY (ROUTINE TESTING W REFLEX): HIV Screen 4th Generation wRfx: NONREACTIVE

## 2016-05-30 LAB — FOLATE: Folate: 20.2 ng/mL (ref >5.9)

## 2016-05-30 MED ORDER — MAGNESIUM SULFATE 2 GM/50ML IV SOLN
2.0000 g | Freq: Once | INTRAVENOUS | Status: AC
  Administered 2016-05-30: 2 g via INTRAVENOUS
  Filled 2016-05-30: qty 50

## 2016-05-30 MED ORDER — CLONAZEPAM 0.5 MG PO TABS
0.5000 mg | ORAL_TABLET | Freq: Three times a day (TID) | ORAL | Status: DC | PRN
  Administered 2016-05-30: 0.5 mg via ORAL
  Filled 2016-05-30: qty 1

## 2016-05-30 MED ORDER — CLONAZEPAM 1 MG PO TABS
1.0000 mg | ORAL_TABLET | Freq: Every day | ORAL | Status: DC | PRN
  Administered 2016-05-31 – 2016-06-03 (×3): 1 mg via ORAL
  Filled 2016-05-30 (×3): qty 1

## 2016-05-30 MED ORDER — POTASSIUM CHLORIDE CRYS ER 20 MEQ PO TBCR
40.0000 meq | EXTENDED_RELEASE_TABLET | Freq: Once | ORAL | Status: AC
  Administered 2016-05-30: 40 meq via ORAL
  Filled 2016-05-30: qty 2

## 2016-05-30 MED ORDER — CLONAZEPAM 0.5 MG PO TABS
0.2500 mg | ORAL_TABLET | Freq: Three times a day (TID) | ORAL | Status: DC | PRN
  Administered 2016-05-30: 0.25 mg via ORAL
  Filled 2016-05-30: qty 1

## 2016-05-30 MED ORDER — TRAMADOL HCL 50 MG PO TABS
100.0000 mg | ORAL_TABLET | Freq: Two times a day (BID) | ORAL | Status: DC | PRN
  Administered 2016-05-30 – 2016-06-03 (×3): 100 mg via ORAL
  Filled 2016-05-30 (×3): qty 2

## 2016-05-30 MED ORDER — CLONAZEPAM 1 MG PO TABS
1.0000 mg | ORAL_TABLET | Freq: Every day | ORAL | Status: DC
  Administered 2016-05-30 – 2016-06-03 (×5): 1 mg via ORAL
  Filled 2016-05-30 (×5): qty 1

## 2016-05-30 NOTE — Progress Notes (Signed)
PROGRESS NOTE  Monique Fleming [id number] DOB: [birth year] DOA: 05/26/2016 PCP: Penni Homans, MD  HPI/Recap of past 24 hours:  Patient is fully awake this am, report feeling anxious, ask for "klonipin", she also report she is hallucinating,  She has frequent watery stool, per RN, patient states she want to die.    Assessment/Plan: Principal Problem:   Acute GI bleeding Active Problems:   Generalized convulsive epilepsy ([place])   COPD with chronic bronchitis ([place])   Angiodysplasia of intestine   Diabetes mellitus, type 2 ([place])   Acute encephalopathy   Hepatic cirrhosis ([place])   1. Acute GI bleeding /Acute blood loss anemiashe was briefly on octreotide initially on presentation due to history of portal hypertension and cirrhosis - gi bleed likely from colitis, octreotide discontinued,  S/p 2units of prbc transfusion. GI consulted 2. Diffuse pan colitis with diarrhea - negative stool studies. GI recommended treat for C diff due to high suspicion for c diff colitis 3. Acute encephalopathy probably secondary to hepatic encephalopathy/ and metabolic encephalopathy from colitis - on lactulose.  Xifaxan stopped per GI recommendation due to concerning for c diff.   For the last two days, patient has been awake in am, then become very drowsy, not arousable to sternal rubs in pm,  CT head/abg unremarkable, EEG with metabolic changes, but no seizure spikes, ammonia better, all labs continue to improve, b12 is high /folate wnl /rpr/hiv pending, dilantin level is low, MRI + acute infarct left cerebellum 4. COPD - no wheezing, on room air, Continue inhalers. 5. Diabetes mellitus type 2 - noninsulin dependent, placed on sliding scale coverage. 6. Hypokalemia/hypomegnesemia: replace mag/k 7. 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 Not a candidate for statin due to cirrhosis, option is  limited, overall prognosis is poor, I have talked to patient's daughter betsy who agrees to palliative team consult. 8. Watery diarrhea, will put in rectal tube    DVT prophylaxis: SCDs. Code Status: DO NOT RESUSCITATE.  Family Communication: Patient's daughter [name] over the phone Disposition Plan: Home.  Consults called: Copywriter, advertising, palliative team   Procedures:  none  Antibiotics:  Oral vanc/ iv flagyl   Objective: BP (!) 125/53 (BP Location: Right Arm)   Pulse 82   Temp 97.5 F (36.4 C)   Resp 18   Ht 5\' 4"  (1.626 m)   Wt 55.1 kg (121 lb 8 oz)   SpO2 100%   BMI 20.86 kg/m   Intake/Output Summary (Last 24 hours) at 05/30/16 1002 Last data filed at 05/30/16 0640  Gross per 24 hour  Intake           815.41 ml  Output                0 ml  Net           815.41 ml   Filed Weights   05/28/16 0500 05/29/16 0556 05/30/16 0642  Weight: 55.7 kg (122 lb 11.2 oz) 53.3 kg (117 lb 6.4 oz) 55.1 kg (121 lb 8 oz)    Exam:   General:  Fully awake , alert, anxious  Cardiovascular: RRR with pac's  Respiratory: CTABL  Abdomen: Soft/ND/NT, positive BS  Musculoskeletal: No Edema  Neuro: know she is at [facility], oriented to person, not oriented to time  Data Reviewed: Basic Metabolic Panel:  Recent Labs Lab 05/27/16 1740 05/28/16 0731 05/28/16 1913 05/29/16 0422 05/30/16  0643  NA 142 141 140 143 144  K 4.6 3.6 3.7 3.3* 3.4*  CL 115* 115* 114* 115* 116*  CO2 21* 20* 21* 21* 21*  GLUCOSE 218* 144* 241* 144* 133*  BUN 80* 78* 63* 51* 26*  CREATININE 1.34* 1.20* 1.15* 1.05* 0.84  CALCIUM 8.3* 7.8* 7.7* 7.8* 7.6*  MG 1.9  --   --  1.7 1.5*   Liver Function Tests:  Recent Labs Lab 05/26/16 2200 05/30/16 0643  AST 45* 42*  ALT 20 19  ALKPHOS 75 59  BILITOT 0.9 0.7  PROT 5.2* 4.8*  ALBUMIN 2.4* 2.1*   No results for input(s): LIPASE, AMYLASE in the last 168 hours.  Recent Labs Lab 05/27/16 0100 05/28/16 1912 05/29/16 0422  05/30/16 0643  AMMONIA 39* 31 32 41*   CBC:  Recent Labs Lab 05/26/16 2200  05/27/16 1740 05/28/16 0102 05/28/16 1913 05/29/16 0422 05/30/16 0643  WBC 25.2*  < > 23.3* 25.8* 15.3* 14.3* 10.3  NEUTROABS 19.4*  --   --   --   --   --   --   HGB 6.8*  < > 8.4* 8.6* 8.1* 8.2* 7.5*  HCT 22.3*  < > 27.1* 27.1* 25.4* 26.6* 24.5*  MCV 94.9  < > 91.9 91.6 92.7 94.0 93.9  PLT 255  < > 181 165 131* 147* 130*  < > = values in this interval not displayed. Cardiac Enzymes:   No results for input(s): CKTOTAL, CKMB, CKMBINDEX, TROPONINI in the last 168 hours. BNP (last 3 results)  Recent Labs  02/27/16 1520  BNP 535.4*    ProBNP (last 3 results) No results for input(s): PROBNP in the last 8760 hours.  CBG:  Recent Labs Lab 05/29/16 1200 05/29/16 1730 05/29/16 2015 05/29/16 2352 05/30/16 0416  GLUCAP 164* 123* 234* 142* 112*    Recent Results (from the past 240 hour(s))  C difficile quick scan w PCR reflex     Status: None   Collection Time: 05/26/16 10:40 PM  Result Value Ref Range Status   C Diff antigen NEGATIVE NEGATIVE Final   C Diff toxin NEGATIVE NEGATIVE Final   C Diff interpretation No C. difficile detected.  Final  Urine culture     Status: None   Collection Time: 05/26/16 10:45 PM  Result Value Ref Range Status   Specimen Description URINE, RANDOM  Final   Special Requests NONE  Final   Culture NO GROWTH  Final   Report Status 05/28/2016 FINAL  Final  Blood culture (routine x 2)     Status: None (Preliminary result)   Collection Time: 05/26/16 11:30 PM  Result Value Ref Range Status   Specimen Description BLOOD LEFT HAND  Final   Special Requests IN PEDIATRIC BOTTLE 3CC  Final   Culture NO GROWTH 2 DAYS  Final   Report Status PENDING  Incomplete  Blood culture (routine x 2)     Status: None (Preliminary result)   Collection Time: 05/26/16 11:35 PM  Result Value Ref Range Status   Specimen Description BLOOD RIGHT HAND  Final   Special Requests BOTTLES  DRAWN AEROBIC AND ANAEROBIC 5CC  Final   Culture NO GROWTH 2 DAYS  Final   Report Status PENDING  Incomplete  Gastrointestinal Panel by PCR , Stool     Status: None   Collection Time: 05/27/16  3:07 AM  Result Value Ref Range Status   Campylobacter species NOT DETECTED NOT DETECTED Final   Plesimonas shigelloides NOT DETECTED NOT DETECTED  Final   Salmonella species NOT DETECTED NOT DETECTED Final   Yersinia enterocolitica NOT DETECTED NOT DETECTED Final   Vibrio species NOT DETECTED NOT DETECTED Final   Vibrio cholerae NOT DETECTED NOT DETECTED Final   Enteroaggregative E coli (EAEC) NOT DETECTED NOT DETECTED Final   Enteropathogenic E coli (EPEC) NOT DETECTED NOT DETECTED Final   Enterotoxigenic E coli (ETEC) NOT DETECTED NOT DETECTED Final   Shiga like toxin producing E coli (STEC) NOT DETECTED NOT DETECTED Final   E. coli O157 NOT DETECTED NOT DETECTED Final   Shigella/Enteroinvasive E coli (EIEC) NOT DETECTED NOT DETECTED Final   Cryptosporidium NOT DETECTED NOT DETECTED Final   Cyclospora cayetanensis NOT DETECTED NOT DETECTED Final   Entamoeba histolytica NOT DETECTED NOT DETECTED Final   Giardia lamblia NOT DETECTED NOT DETECTED Final   Adenovirus F40/41 NOT DETECTED NOT DETECTED Final   Astrovirus NOT DETECTED NOT DETECTED Final   Norovirus GI/GII NOT DETECTED NOT DETECTED Final   Rotavirus A NOT DETECTED NOT DETECTED Final   Sapovirus (I, II, IV, and V) NOT DETECTED NOT DETECTED Final     Studies: Mr Brain Wo Contrast  Result Date: 05/29/2016 CLINICAL DATA:  Confusion EXAM: MRI HEAD WITHOUT CONTRAST TECHNIQUE: Multiplanar, multiecho pulse sequences of the brain and surrounding structures were obtained without intravenous contrast. COMPARISON:  CT 05/28/2016.  MRI 02/28/2016 FINDINGS: Incomplete study. The patient was confused and was not able to complete the study. Acute infarct in the left cerebellum measuring approximately 10 x 15 mm. No other acute infarct identified.  Generalized atrophy.  Negative for hydrocephalus. Chronic microvascular ischemic change in the white matter bilaterally. Chronic infarct in the right cerebellum. Chronic infarct in the left cerebellum. Multiple areas of chronic micro hemorrhage in the cerebral hemispheres bilaterally. Most of these are cortically based hemorrhage. There is also chronic micro hemorrhage in the pons and in the cerebellum bilaterally. IMPRESSION: Acute infarct left cerebellum Atrophy and chronic ischemia Multiple areas of chronic hemorrhage in the brain which may be due to poorly controlled hypertension. Patient not able to complete the study. Electronically Signed   By: Franchot Gallo M.D.   On: 05/29/2016 17:40    Scheduled Meds: . arformoterol  15 mcg Nebulization BID  . budesonide  0.25 mg Nebulization BID  . desvenlafaxine  100 mg Oral Daily  . feeding supplement (ENSURE ENLIVE)  237 mL Oral BID BM  . fluticasone  2 spray Each Nare Daily  . insulin aspart  0-9 Units Subcutaneous Q4H  . lactulose  30 g Oral QID  . magnesium oxide  400 mg Oral BID  . magnesium sulfate 1 - 4 g bolus IVPB  2 g Intravenous Once  . metronidazole  500 mg Intravenous Q8H  . montelukast  10 mg Oral QHS  . pantoprazole  40 mg Intravenous Q12H  . phenytoin  200 mg Oral QHS  . vancomycin  250 mg Oral Q6H    Continuous Infusions:     Time spent: 49mins  Yahia Bottger MD, PhD  [facility] Pager [phone]. If 7PM-7AM, please contact night-coverage at [email or web address], password [facility] 05/30/2016, 10:02 AM  LOS: 3 days

## 2016-05-30 NOTE — Progress Notes (Signed)
Chaplain responded to page from RN that pt and her daughter requested prayer. Talked with pt and her daughter at bedside. Pt is very much a [employer] (according to daughter). Nonetheless she had spiritual concerns she wanted to discuss. We had a fruitful conversation and she seemed very much at peace afterwards. We had prayer together also. She requested a chaplain visit again tomorrow.

## 2016-05-30 NOTE — Progress Notes (Signed)
Progress Note   Subjective  Pt awake and reports anxiety and halluncinations   Objective  Vital signs in last 24 hours: Temp:  [97.5 F (36.4 C)-98.6 F (37 C)] 98.6 F (37 C) (08/26 1345) Pulse Rate:  [82-98] 98 (08/26 1345) Resp:  [18-24] 18 (08/26 0642) BP: (116-159)/(44-83) 116/44 (08/26 1345) SpO2:  [94 %-100 %] 100 % (08/26 1345) Weight:  [121 lb 8 oz (55.1 kg)] 121 lb 8 oz (55.1 kg) (08/26 [id number]) Last BM Date: 05/30/16  General: Well-developed, elderly, frail, in NAD Heart:  Regular rate and rhythm; no murmurs Chest: Clear to ascultation bilaterally Abdomen:  Soft, nontender and nondistended. Normal bowel sounds, without guarding, and without rebound.   Extremities:  Without edema. Neurologic:  Alert and oriented x 2 Psych:  Alert and cooperative. Normal mood and affect.  Intake/Output from previous day: 08/25 0701 - 08/26 0700 In: 915.4 [P.O.:100; I.V.:615.4; IV Piggyback:200] Out: -  Intake/Output this shift: No intake/output data recorded.  Lab Results:  Recent Labs  05/28/16 1913 05/29/16 0422 05/30/16 0643  WBC 15.3* 14.3* 10.3  HGB 8.1* 8.2* 7.5*  HCT 25.4* 26.6* 24.5*  PLT 131* 147* 130*   BMET  Recent Labs  05/28/16 1913 05/29/16 0422 05/30/16 0643  NA 140 143 144  K 3.7 3.3* 3.4*  CL 114* 115* 116*  CO2 21* 21* 21*  GLUCOSE 241* 144* 133*  BUN 63* 51* 26*  CREATININE 1.15* 1.05* 0.84  CALCIUM 7.7* 7.8* 7.6*   LFT  Recent Labs  05/30/16 0643  PROT 4.8*  ALBUMIN 2.1*  AST 42*  ALT 19  ALKPHOS 59  BILITOT 0.7    Studies/Results: Ct Head Wo Contrast  Result Date: 05/28/2016 CLINICAL DATA:  Lethargy EXAM: CT HEAD WITHOUT CONTRAST TECHNIQUE: Contiguous axial images were obtained from the base of the skull through the vertex without intravenous contrast. COMPARISON:  05/27/2016 FINDINGS: Brain: No evidence of acute infarction, hemorrhage, hydrocephalus, extra-axial collection or mass lesion/mass effect. Old lacunar infarcts in  the bilateral cerebellum. Tiny lacunar infarct in the right external capsule. Vascular: No hyperdense vessel or unexpected calcification. Skull: No evidence of calvarial fracture. Sinuses/Orbits: The visualized paranasal sinuses are essentially clear. The mastoid air cells are unopacified. Other: Global cortical atrophy.  No ventriculomegaly. Subcortical white matter and periventricular small vessel ischemic changes. Intracranial atherosclerosis. IMPRESSION: No evidence of acute intracranial abnormality. Atrophy with small vessel ischemic changes. Old bilateral cerebellar infarcts. Electronically Signed   By: Julian Hy M.D.   On: 05/28/2016 20:18   Mr Brain Wo Contrast  Result Date: 05/29/2016 CLINICAL DATA:  Confusion EXAM: MRI HEAD WITHOUT CONTRAST TECHNIQUE: Multiplanar, multiecho pulse sequences of the brain and surrounding structures were obtained without intravenous contrast. COMPARISON:  CT 05/28/2016.  MRI 02/28/2016 FINDINGS: Incomplete study. The patient was confused and was not able to complete the study. Acute infarct in the left cerebellum measuring approximately 10 x 15 mm. No other acute infarct identified. Generalized atrophy.  Negative for hydrocephalus. Chronic microvascular ischemic change in the white matter bilaterally. Chronic infarct in the right cerebellum. Chronic infarct in the left cerebellum. Multiple areas of chronic micro hemorrhage in the cerebral hemispheres bilaterally. Most of these are cortically based hemorrhage. There is also chronic micro hemorrhage in the pons and in the cerebellum bilaterally. IMPRESSION: Acute infarct left cerebellum Atrophy and chronic ischemia Multiple areas of chronic hemorrhage in the brain which may be due to poorly controlled hypertension. Patient not able to complete the study. Electronically Signed  By: Franchot Gallo M.D.   On: 05/29/2016 17:40   Dg Abd Portable 1v  Result Date: 05/28/2016 CLINICAL DATA:  Abdominal distension EXAM:  PORTABLE ABDOMEN - 1 VIEW COMPARISON:  CT scan May 27, 2016 FINDINGS: No evidence of bowel obstruction. No acute abnormalities are seen on this single supine frontal view of the abdomen and pelvis. IMPRESSION: No cause for distention identified. Electronically Signed   By: Dorise Bullion III M.D   On: 05/28/2016 18:50      Assessment & Plan   1. Decompensated cirrhosis admitted with AMS, abdominal pain, diarrhea. GI Path panel negative and C. diff quick screen negative She had been on Bactrim and Xifaxan within past 2 weeks - raising  concern for C. diff even though toxin negative Symptoms and WBC have improved on vanco po and Flagyl IV  Diarrhea difficult to assess as she is on lactulose for HE Consider Flex with bx - however since she is afebrile, abdomen benign, WBC is now normal and palliative care consult is pending, will off hold for now.   2. Anemia, chronic and acute. Hb drifting lower - no active bleeding - hx of portal gastropathy and right colon AVM's. Consider transfusion for Hb <7 or sooner per primary service   3. AMS, neurologic. MRI shows acute left cerebellar infarction, chronic ischemia and multiple areas of chronic brain hemorrhage. EEG performed yesterday. In addition she has HE. Ammonia minimally elevated at 41. Palliative care consult is planned.   Principal Problem:   Acute GI bleeding Active Problems:   Generalized convulsive epilepsy ([place])   COPD with chronic bronchitis ([place])   Angiodysplasia of intestine   Diabetes mellitus, type 2 ([place])   Acute encephalopathy   Hepatic cirrhosis ([place])    LOS: 3 days   Malcolm T. Fuller Plan MD 05/30/2016, 2:58 PM [id number] 8a-5p weekdays [phone] after 5p, weekends, holidays

## 2016-05-30 NOTE — Progress Notes (Signed)
CSW received consult regarding PT recommendation of SNF at discharge.  Patient and daughter are refusing SNF. Patient has been to SNF before and daughter states that it was a terrible experience. She wants patient to return home with home health services- [facility].  CSW signing off.   Percell Locus Josslyn Ciolek LCSWA [phone]

## 2016-05-30 NOTE — Evaluation (Signed)
Physical Therapy Evaluation Patient Details Name: Monique Fleming MRN: [id number] DOB: 1928/12/31 Today's Date: 05/30/2016   History of Present Illness  Pt is a 82 y/o F who was brought to the ER after pt found to be increasingly confused per pt's daughter.  Pt had been experiencing some abdominal discomfort with multiple episodes of diarrhea.  Pt admitted for hepatic encephalopathy and GI bleed.  EEG on 8/25 abnormal with general slowing.  Pt's PMH includes adenocarcinoma, anxiety, cellulitis, macular degeneration, seizures, spinal stenosis, TIA.    Clinical Impression  Pt admitted with above diagnosis. Pt currently with functional limitations due to the deficits listed below (see PT Problem List). PTA pt was independent and living alone.  Monique Fleming presents with AMS and requires multimodal cues and min assist for bed mobility and transfer to chair today. Her two daughters work and will likely be unable to provide 24/7 assist, recommending SNF at d/c.  Pt will benefit from skilled PT to increase their independence and safety with mobility to allow discharge to the venue listed below.      Follow Up Recommendations SNF;Supervision/Assistance - 24 hour    Equipment Recommendations  Rolling walker with 5" wheels    Recommendations for Other Services OT consult     Precautions / Restrictions Precautions Precautions: Fall Restrictions Weight Bearing Restrictions: No      Mobility  Bed Mobility Overal bed mobility: Needs Assistance Bed Mobility: Rolling;Supine to Sit Rolling: Min assist   Supine to sit: Min assist     General bed mobility comments: Assist to elevate trunk to achieve sitting.  Pt reports dizziness that clears quickly.  Transfers Overall transfer level: Needs assistance Equipment used: None Transfers: Sit to/from [facility] Sit to Stand: Min assist Stand pivot transfers: Min assist       General transfer comment: Cues for sequencing and for  hand placement.  Pt grabs ahold of PT once standing and requires directional cues.  Ambulation/Gait                Stairs            Wheelchair Mobility    Modified Rankin (Stroke Patients Only)       Balance Overall balance assessment: Needs assistance Sitting-balance support: No upper extremity supported;Feet supported Sitting balance-Leahy Scale: Fair     Standing balance support: Bilateral upper extremity supported;During functional activity Standing balance-Leahy Scale: Poor Standing balance comment: Reaches out to PT for support                             Pertinent Vitals/Pain Pain Assessment: Faces Faces Pain Scale: Hurts little more Pain Location: "all over" Pain Descriptors / Indicators: Discomfort Pain Intervention(s): Limited activity within patient's tolerance;Monitored during session;Repositioned    Home Living Family/patient expects to be discharged to:: Skilled nursing facility                      Prior Function Level of Independence: Independent         Comments: Information provided by NT and RN who obtained from daughter.  Pt was indepedent and living alone PTA. Driving to the grocery store 2x/wk and not using AD.     Hand Dominance        Extremity/Trunk Assessment   Upper Extremity Assessment: Defer to OT evaluation           Lower Extremity Assessment: Generalized weakness  Communication   Communication: HOH (has hearing aides but not at hospital)  Cognition Arousal/Alertness: Awake/alert Behavior During Therapy: Anxious Overall Cognitive Status: Impaired/Different from baseline Area of Impairment: Orientation;Following commands;Awareness;Safety/judgement;Problem solving;Attention;Memory Orientation Level: Disoriented to;Time;Situation Current Attention Level: Focused Memory: Decreased short-term memory Following Commands: Follows one step commands inconsistently Safety/Judgement:  Decreased awareness of safety;Decreased awareness of deficits Awareness: Intellectual Problem Solving: Slow processing;Decreased initiation;Difficulty sequencing;Requires verbal cues;Requires tactile cues General Comments: Reports the year as 41.  Tangential conversation.    General Comments General comments (skin integrity, edema, etc.): BM in bed upon PT arrival, assisted NT with clean up.    Exercises General Exercises - Upper Extremity Shoulder Flexion: AROM;Both;10 reps;Seated General Exercises - Lower Extremity Long Arc Quad: AROM;Both;10 reps;Seated      Assessment/Plan    PT Assessment Patient needs continued PT services  PT Diagnosis Difficulty walking;Generalized weakness;Altered mental status   PT Problem List Decreased strength;Decreased activity tolerance;Decreased balance;Decreased mobility;Decreased cognition;Decreased knowledge of use of DME;Decreased safety awareness;Pain  PT Treatment Interventions DME instruction;Gait training;Stair training;Therapeutic activities;Functional mobility training;Therapeutic exercise;Balance training;Cognitive remediation;Patient/family education   PT Goals (Current goals can be found in the Care Plan section) Acute Rehab PT Goals Patient Stated Goal: to go home with her daughter PT Goal Formulation: With patient Time For Goal Achievement: 06/13/16 Potential to Achieve Goals: Good    Frequency Min 2X/week   Barriers to discharge Decreased caregiver support Living alone PTA    Co-evaluation               End of Session Equipment Utilized During Treatment: Gait belt Activity Tolerance: Patient limited by fatigue Patient left: in chair;with call bell/phone within reach;with chair alarm set Nurse Communication: Mobility status         Time: [id number] PT Time Calculation (min) (ACUTE ONLY): 24 min   Charges:   PT Evaluation $PT Eval Moderate Complexity: 1 Procedure PT Treatments $Therapeutic Activity: 8-22  mins   PT G Codes:       Collie Siad PT, DPT  Pager: [phone] Phone: [phone] 05/30/2016, 10:27 AM

## 2016-05-31 DIAGNOSIS — Z515 Encounter for palliative care: Secondary | ICD-10-CM

## 2016-05-31 LAB — COMPREHENSIVE METABOLIC PANEL
ALK PHOS: 63 U/L (ref 38–126)
ALT: 19 U/L (ref 14–54)
ANION GAP: 3 — AB (ref 5–15)
AST: 37 U/L (ref 15–41)
Albumin: 2.1 g/dL — ABNORMAL LOW (ref 3.5–5.0)
BILIRUBIN TOTAL: 0.7 mg/dL (ref 0.3–1.2)
BUN: 24 mg/dL — ABNORMAL HIGH (ref 6–20)
CALCIUM: 7.3 mg/dL — AB (ref 8.9–10.3)
CO2: 21 mmol/L — ABNORMAL LOW (ref 22–32)
Chloride: 116 mmol/L — ABNORMAL HIGH (ref 101–111)
Creatinine, Ser: 0.9 mg/dL (ref 0.44–1.00)
GFR calc non Af Amer: 57 mL/min — ABNORMAL LOW (ref >60)
Glucose, Bld: 148 mg/dL — ABNORMAL HIGH (ref 65–99)
POTASSIUM: 3.6 mmol/L (ref 3.5–5.1)
Sodium: 140 mmol/L (ref 135–145)
TOTAL PROTEIN: 4.8 g/dL — AB (ref 6.5–8.1)

## 2016-05-31 LAB — CBC
HEMATOCRIT: 24.3 % — AB (ref 36.0–46.0)
HEMOGLOBIN: 7.4 g/dL — AB (ref 12.0–15.0)
MCH: 28.6 pg (ref 26.0–34.0)
MCHC: 30.5 g/dL (ref 30.0–36.0)
MCV: 93.8 fL (ref 78.0–100.0)
Platelets: 129 10*3/uL — ABNORMAL LOW (ref 150–400)
RBC: 2.59 MIL/uL — ABNORMAL LOW (ref 3.87–5.11)
RDW: 17.7 % — AB (ref 11.5–15.5)
WBC: 12.2 10*3/uL — ABNORMAL HIGH (ref 4.0–10.5)

## 2016-05-31 LAB — GLUCOSE, CAPILLARY
GLUCOSE-CAPILLARY: 107 mg/dL — AB (ref 65–99)
GLUCOSE-CAPILLARY: 141 mg/dL — AB (ref 65–99)
GLUCOSE-CAPILLARY: 231 mg/dL — AB (ref 65–99)
GLUCOSE-CAPILLARY: 273 mg/dL — AB (ref 65–99)
Glucose-Capillary: 143 mg/dL — ABNORMAL HIGH (ref 65–99)
Glucose-Capillary: 189 mg/dL — ABNORMAL HIGH (ref 65–99)
Glucose-Capillary: 206 mg/dL — ABNORMAL HIGH (ref 65–99)

## 2016-05-31 LAB — PREPARE RBC (CROSSMATCH)

## 2016-05-31 LAB — AMMONIA: AMMONIA: 40 umol/L — AB (ref 9–35)

## 2016-05-31 LAB — MAGNESIUM: MAGNESIUM: 2 mg/dL (ref 1.7–2.4)

## 2016-05-31 MED ORDER — LACTULOSE 10 GM/15ML PO SOLN
20.0000 g | Freq: Three times a day (TID) | ORAL | Status: DC
  Administered 2016-05-31 – 2016-06-02 (×5): 20 g via ORAL
  Filled 2016-05-31 (×5): qty 30

## 2016-05-31 MED ORDER — LACTULOSE 10 GM/15ML PO SOLN: 30.0000 g | Freq: Three times a day (TID) | ORAL | Status: DC

## 2016-05-31 MED ORDER — SODIUM CHLORIDE 0.9 % IV SOLN
Freq: Once | INTRAVENOUS | Status: AC
  Administered 2016-05-31: 18:00:00 via INTRAVENOUS

## 2016-05-31 MED ORDER — POTASSIUM CHLORIDE CRYS ER 20 MEQ PO TBCR
40.0000 meq | EXTENDED_RELEASE_TABLET | Freq: Once | ORAL | Status: AC
  Administered 2016-05-31: 40 meq via ORAL
  Filled 2016-05-31: qty 2

## 2016-05-31 NOTE — Progress Notes (Signed)
PROGRESS NOTE  Monique Fleming [id number] DOB: November 04, 1928 DOA: 05/26/2016 PCP: Penni Homans, MD  HPI/Recap of past 24 hours:  Patient is resting when I entered the room, she report feeling better this am, states it is 2017, know she is in the hospital, denies hallucination this am   Assessment/Plan: Principal Problem:   Acute GI bleeding Active Problems:   Generalized convulsive epilepsy ([place])   COPD with chronic bronchitis ([place])   Angiodysplasia of intestine   Diabetes mellitus, type 2 ([place])   Acute encephalopathy   Hepatic cirrhosis ([place])   Confusion   Diarrhea of presumed infectious origin   1. Acute GI bleeding /Acute blood loss anemiashe was briefly on octreotide initially on presentation due to history of portal hypertension and cirrhosis - gi bleed likely from colitis, octreotide discontinued,  S/p 2units of prbc transfusion on 8/23. GI consulted.       hgb 7.5-7.4, may need to be transfused again if hgb continue trending down. 2. Diffuse pan colitis with diarrhea - negative stool studies. GI recommended treat for C diff due to high suspicion for c diff colitis 3. Acute encephalopathy probably secondary to hepatic encephalopathy/ and metabolic encephalopathy from colitis - on lactulose.  Xifaxan stopped per GI recommendation due to concerning for c diff.   For the last two days, patient has been awake in am, then become very drowsy, not arousable to sternal rubs in pm,  CT head/abg unremarkable, EEG with metabolic changes, but no seizure spikes, ammonia better, all labs continue to improve, b12 is high /folate wnl /rpr/hiv negative, dilantin level is low, MRI + acute infarct left cerebellum 4. COPD - no wheezing, on room air, Continue inhalers. 5. Diabetes mellitus type 2 - noninsulin dependent, placed on sliding scale coverage. 6. Hypokalemia/hypomegnesemia: replace mag/k 7. 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 Not a candidate for statin due to cirrhosis, option is limited, overall prognosis is poor, I have talked to patient's daughter betsy who agrees to palliative team consult. Family meeting with palliative team at 1pm on 8/27, appreciate palliative team input 8. Watery diarrhea, patient fulled out rectal tube, RN reported exploasive diarrhea on 8/27, will decreased lactulose to tid, monitor mental status and ammonia level    DVT prophylaxis: SCDs. Code Status: DO NOT RESUSCITATE.  Family Communication: patient Disposition Plan: Home.  Consults called: Copywriter, advertising, palliative team   Procedures:  none  Antibiotics:  Oral vanc/ iv flagyl   Objective: BP (!) 121/44 (BP Location: Right Arm)   Pulse 78   Temp 98.4 F (36.9 C)   Resp 18   Ht 5\' 4"  (1.626 m)   Wt 56.7 kg (125 lb)   SpO2 98%   BMI 21.46 kg/m   Intake/Output Summary (Last 24 hours) at 05/31/16 0915 Last data filed at 05/31/16 [id number]  Gross per 24 hour  Intake              200 ml  Output                0 ml  Net              200 ml   Filed Weights   05/29/16 0556 05/30/16 0642 05/31/16 0500  Weight: 53.3 kg (117 lb 6.4 oz) 55.1 kg (121 lb 8 oz) 56.7 kg (125 lb)    Exam:   General:  Fully awake ,  alert, pleasant, NAD  Cardiovascular: RRR with pac's  Respiratory: CTABL  Abdomen: Soft/ND/NT, positive BS  Musculoskeletal: No Edema  Neuro: know she is at [facility], oriented to person, know it is 2017  Data Reviewed: Basic Metabolic Panel:  Recent Labs Lab 05/27/16 1740 05/28/16 0731 05/28/16 1913 05/29/16 0422 05/30/16 0643 05/31/16 0544  NA 142 141 140 143 144 140  K 4.6 3.6 3.7 3.3* 3.4* 3.6  CL 115* 115* 114* 115* 116* 116*  CO2 21* 20* 21* 21* 21* 21*  GLUCOSE 218* 144* 241* 144* 133* 148*  BUN 80* 78* 63* 51* 26* 24*  CREATININE 1.34* 1.20* 1.15* 1.05* 0.84 0.90  CALCIUM 8.3* 7.8* 7.7* 7.8* 7.6* 7.3*  MG 1.9  --    --  1.7 1.5* 2.0   Liver Function Tests:  Recent Labs Lab 05/26/16 2200 05/30/16 0643 05/31/16 0544  AST 45* 42* 37  ALT 20 19 19   ALKPHOS 75 59 63  BILITOT 0.9 0.7 0.7  PROT 5.2* 4.8* 4.8*  ALBUMIN 2.4* 2.1* 2.1*   No results for input(s): LIPASE, AMYLASE in the last 168 hours.  Recent Labs Lab 05/27/16 0100 05/28/16 1912 05/29/16 0422 05/30/16 0643 05/31/16 0544  AMMONIA 39* 31 32 41* 40*   CBC:  Recent Labs Lab 05/26/16 2200  05/28/16 0102 05/28/16 1913 05/29/16 0422 05/30/16 0643 05/31/16 0544  WBC 25.2*  < > 25.8* 15.3* 14.3* 10.3 12.2*  NEUTROABS 19.4*  --   --   --   --   --   --   HGB 6.8*  < > 8.6* 8.1* 8.2* 7.5* 7.4*  HCT 22.3*  < > 27.1* 25.4* 26.6* 24.5* 24.3*  MCV 94.9  < > 91.6 92.7 94.0 93.9 93.8  PLT 255  < > 165 131* 147* 130* 129*  < > = values in this interval not displayed. Cardiac Enzymes:   No results for input(s): CKTOTAL, CKMB, CKMBINDEX, TROPONINI in the last 168 hours. BNP (last 3 results)  Recent Labs  02/27/16 1520  BNP 535.4*    ProBNP (last 3 results) No results for input(s): PROBNP in the last 8760 hours.  CBG:  Recent Labs Lab 05/30/16 1633 05/30/16 2005 05/31/16 0034 05/31/16 0417 05/31/16 0749  GLUCAP 191* 259* 107* 141* 143*    Recent Results (from the past 240 hour(s))  C difficile quick scan w PCR reflex     Status: None   Collection Time: 05/26/16 10:40 PM  Result Value Ref Range Status   C Diff antigen NEGATIVE NEGATIVE Final   C Diff toxin NEGATIVE NEGATIVE Final   C Diff interpretation No C. difficile detected.  Final  Urine culture     Status: None   Collection Time: 05/26/16 10:45 PM  Result Value Ref Range Status   Specimen Description URINE, RANDOM  Final   Special Requests NONE  Final   Culture NO GROWTH  Final   Report Status 05/28/2016 FINAL  Final  Blood culture (routine x 2)     Status: None (Preliminary result)   Collection Time: 05/26/16 11:30 PM  Result Value Ref Range Status    Specimen Description BLOOD LEFT HAND  Final   Special Requests IN PEDIATRIC BOTTLE 3CC  Final   Culture NO GROWTH 3 DAYS  Final   Report Status PENDING  Incomplete  Blood culture (routine x 2)     Status: None (Preliminary result)   Collection Time: 05/26/16 11:35 PM  Result Value Ref Range Status   Specimen Description BLOOD  RIGHT HAND  Final   Special Requests BOTTLES DRAWN AEROBIC AND ANAEROBIC 5CC  Final   Culture NO GROWTH 3 DAYS  Final   Report Status PENDING  Incomplete  Gastrointestinal Panel by PCR , Stool     Status: None   Collection Time: 05/27/16  3:07 AM  Result Value Ref Range Status   Campylobacter species NOT DETECTED NOT DETECTED Final   Plesimonas shigelloides NOT DETECTED NOT DETECTED Final   Salmonella species NOT DETECTED NOT DETECTED Final   Yersinia enterocolitica NOT DETECTED NOT DETECTED Final   Vibrio species NOT DETECTED NOT DETECTED Final   Vibrio cholerae NOT DETECTED NOT DETECTED Final   Enteroaggregative E coli (EAEC) NOT DETECTED NOT DETECTED Final   Enteropathogenic E coli (EPEC) NOT DETECTED NOT DETECTED Final   Enterotoxigenic E coli (ETEC) NOT DETECTED NOT DETECTED Final   Shiga like toxin producing E coli (STEC) NOT DETECTED NOT DETECTED Final   E. coli O157 NOT DETECTED NOT DETECTED Final   Shigella/Enteroinvasive E coli (EIEC) NOT DETECTED NOT DETECTED Final   Cryptosporidium NOT DETECTED NOT DETECTED Final   Cyclospora cayetanensis NOT DETECTED NOT DETECTED Final   Entamoeba histolytica NOT DETECTED NOT DETECTED Final   Giardia lamblia NOT DETECTED NOT DETECTED Final   Adenovirus F40/41 NOT DETECTED NOT DETECTED Final   Astrovirus NOT DETECTED NOT DETECTED Final   Norovirus GI/GII NOT DETECTED NOT DETECTED Final   Rotavirus A NOT DETECTED NOT DETECTED Final   Sapovirus (I, II, IV, and V) NOT DETECTED NOT DETECTED Final     Studies: No results found.  Scheduled Meds: . arformoterol  15 mcg Nebulization BID  . budesonide  0.25 mg  Nebulization BID  . clonazePAM  1 mg Oral QHS  . desvenlafaxine  100 mg Oral Daily  . feeding supplement (ENSURE ENLIVE)  237 mL Oral BID BM  . fluticasone  2 spray Each Nare Daily  . insulin aspart  0-9 Units Subcutaneous Q4H  . lactulose  30 g Oral QID  . magnesium oxide  400 mg Oral BID  . metronidazole  500 mg Intravenous Q8H  . montelukast  10 mg Oral QHS  . pantoprazole  40 mg Intravenous Q12H  . phenytoin  200 mg Oral QHS  . vancomycin  250 mg Oral Q6H    Continuous Infusions:     Time spent: 66mins  Emilliano Dilworth MD, PhD  [facility] Pager [phone]. If 7PM-7AM, please contact night-coverage at [email or web address], password [facility] 05/31/2016, 9:15 AM  LOS: 4 days

## 2016-05-31 NOTE — Consult Note (Signed)
Consultation Note Date: 05/31/2016   Patient Name: Monique Fleming  DOB: 1929/09/12  MRN: [id number]  Age / Sex: 82 y.o., female  PCP: Mosie Lukes, MD Referring Physician: Florencia Reasons, MD  Reason for Consultation: Establishing goals of care and Hospice Evaluation  HPI/Patient Profile: 82 y.o. female  with past medical history of Cirrhosis of the liver, COPD, diabetes, history of adenocarcinoma of the colon diagnosed in 2008, dementia, admitted on 05/26/2016 with increased confusion, abdominal discomfort, and multiple episodes of diarrhea. In the emergency room she was oriented to her name only. Ammonia levels were mildly elevated.. Additionally her hemoglobin had dropped to 6. Patient was admitted for hepatic encephalopathy and GI bleed.Patient was transfused 2 units and started on octreotide and Protonix. Initial stool studies were negative for C. difficile but patient being treated as if she had C. difficile due to high suspicion. She was started on lactulose. Per chart review patient is alert in the morning but  becomes increasing lethargic as the day goes on. New MRI reveals acute left cerebellar infarct, atrophy, chronic ischemia chronic hemorrhage.   Patient's hemoglobin is trending downward and is 7.4 today. Her albumin is 2.1, ammonia level 41. She was also having hallucinations while in the hospital but is denying that this morning. Patient has been seen by neurology. She is also not a candidate for anticoagulation because of history of GI bleed and chronic brain hemorrhage. She is not on a statin due to cirrhosis. Overall it's felt that her prognosis is poor. Patient has been living independently prior to this decline noted this year. She has had admissions to [facility] for rehabilitation. At this point family has been declining further admission to SNF  Clinical Assessment and Goals of Care: Patient is alert  and very pleasant. She is hard of hearing, but when she hears you she does appear to be answering simple direct questions appropriately. She is oriented to herself, that she is in the hospital. She is asking repeatedly when she is going home, and if she can go back to sleep. She is denying auditory and visual hallucinations currently and shares that this is happening to her twice in the past. She shows some insight that she can't stay by herself and tells me that she is going to stay with her daughter Monique Fleming.  Patient has 3 children, who all share in decision making. Her daughter Monique Fleming is primary contact at [phone]. Monique Fleming is a Marine scientist and coordinates a lot of her mother's care    SUMMARY OF RECOMMENDATIONS   Family well informed with high medical literacy Goals of care at this point feel premature to them until they can speak further with [facility] gastroenterology; she has been patient with that practice for a number of years and is seen primarily by Dr. Henrene Pastor They are unable to meet with gastroenterology in person because of work constraints, but would like to speak to someone on Monday regarding questions about further GI workup for GI bleed. I did attempt to call on-call  provider on 05/31/16 but answering service repeatedly disconnected calls. Family reports they understand that their mother would incur some risk in order to have a colonoscopy but family feels that if this was possible, and could stop her GI bleeding they would be interested in pursuing this vs flexible sigmoidoscopy Palliative medicine team to help facilitate conversation between Dr. Henrene Pastor and or representative from his office and daughter Monique Fleming on 06/01/2016 We did discuss chronic GI bleeding in the setting of a frail elderly person with new acute stroke on chronic strokes, cirrhosis and hepatic encephalopathy. Family feels that they can improve her functional status if her bleeding as well as diarrhea can be  controlled.   Code Status/Advance Care Planning:  DNR    Symptom Management:   Pain: Continue with tramadol as needed  Anxiety/depression: Continue with Pristiq and Klonopin  Confusion/agitation: Likely multifactorial in the setting of dementia, cirrhosis. Family is requesting that lactulose be decreased back to 3 times a day  Palliative Prophylaxis:   Delirium Protocol, Frequent Pain Assessment, Oral Care and Turn Reposition  Additional Recommendations (Limitations, Scope, Preferences):  No Artificial Feeding, No Chemotherapy, No Hemodialysis, No Radiation and No Tracheostomy  Psycho-social/Spiritual:   Desire for further Chaplaincy support:no  Additional Recommendations: Grief/Bereavement Support  Prognosis Difficult to say what her overall prognosis is but I would not be surprised if she had a prognosis of less than 6 months in the setting of mild dementia, chronic, now acute anemia, active GI bleed that may or may not be able to be addressed without incurring too much risk, acute on chronic stroke and cirrhosis with hepatic encephalopathy. Patient's daughter Monique Fleming is a clinical social worker with hospice of [facility]  Discharge Planning: Likely home with either home health or hospice support. Family is not receptive to this skilled nursing facility      Primary Diagnoses: Present on Admission: . Acute GI bleeding . Generalized convulsive epilepsy ([place]) . COPD with chronic bronchitis ([place]) . Angiodysplasia of intestine . Hepatic cirrhosis ([place]) . Acute encephalopathy   I have reviewed the medical record, interviewed the patient and family, and examined the patient. The following aspects are pertinent.  Past Medical History:  Diagnosis Date  . Abnormal liver function test   . Adenocarcinoma ([place]) 2008   colon,s/p right hemicolectomy  . Anemia    iron deficiency  . Angina   . Anxiety    Dr Casimiro Needle  . Aortic stenosis    moderate by echo 6/11  gradient 20/107mmHg for mean and peak  . Asthma   . Atherosclerosis   . Bursitis    spine per pt  . Cellulitis   . Chronic bronchitis   . Cirrhosis of liver (HCC)    hx of  . Clostridium difficile diarrhea   . COPD (chronic obstructive pulmonary disease) ([place])   . Depression   . Diabetes mellitus    type II  . Diverticular disease   . Elevated BP 07/09/2015  . Encephalopathy, metabolic   . Epistaxis 04/14/2016  . GERD (gastroesophageal reflux disease)   . IBS (irritable bowel syndrome)   . Infectious diarrhea(009.2)   . Insomnia    chronic  . Internal hemorrhoids   . Macular degeneration 02/18/2014   Follows with [facility]  . Peripheral vascular disease ([place])   . Protein calorie malnutrition ([place])   . Renal cyst   . Seizures (HCC)    hx of one seizure "  . Sinusitis   . Spinal stenosis 03/29/2016  .  Thrombocytopenia ([place]) 04/01/2013  . Thrush   . TIA (transient ischemic attack)    Dr. [name]  . Type 2 diabetes mellitus with vascular disease ([place]) 03/02/2007   Chronic     Social History   Social History  . Marital status: Widowed    Spouse name: N/A  . Number of children: 3  . Years of education: N/A   Occupational History  . retired   .  Retired   Social History Main Topics  . Smoking status: Former Smoker    Packs/day: 0.50    Years: 70.00    Types: Cigarettes    Start date: 02/14/1951    Quit date: 01/25/2016  . Smokeless tobacco: Never Used     Comment: 01/2016 Quit  . Alcohol use No  . Drug use: No  . Sexual activity: No   Other Topics Concern  . None   Social History Narrative   Patient signed a Environmental consultant to allow her daughter, Monique Fleming, to have access to her medical records/information. Entered by Fleet Contras March 24,2011 @ 2:47 pm   Dailly Caffeine Use   Family History  Problem Relation Age of Onset  . Heart disease Mother   . Hypertension Father   . Colon cancer Paternal Grandmother   . Esophageal cancer Brother   .  Diabetes Brother   . Rectal cancer Neg Hx   . Stomach cancer Neg Hx    Scheduled Meds: . arformoterol  15 mcg Nebulization BID  . budesonide  0.25 mg Nebulization BID  . clonazePAM  1 mg Oral QHS  . desvenlafaxine  100 mg Oral Daily  . feeding supplement (ENSURE ENLIVE)  237 mL Oral BID BM  . fluticasone  2 spray Each Nare Daily  . insulin aspart  0-9 Units Subcutaneous Q4H  . lactulose  30 g Oral QID  . magnesium oxide  400 mg Oral BID  . metronidazole  500 mg Intravenous Q8H  . montelukast  10 mg Oral QHS  . pantoprazole  40 mg Intravenous Q12H  . phenytoin  200 mg Oral QHS  . vancomycin  250 mg Oral Q6H   Continuous Infusions:  PRN Meds:.albuterol, clonazePAM, ondansetron **OR** ondansetron (ZOFRAN) IV, traMADol Medications Prior to Admission:  Prior to Admission medications   Medication Sig Start Date End Date Taking? Authorizing Provider  albuterol (PROAIR HFA) 108 (90 BASE) MCG/ACT inhaler Inhale 2 puffs into the lungs every 4 (four) hours as needed for wheezing or shortness of breath. 11/12/14  Yes Mosie Lukes, MD  cholestyramine (QUESTRAN) 4 g packet MIX 1 PACKET (4 GRAM TOTAL) IN LIQUID AND DRINK DAILY AS NEEDED FOR DIARRHEA 05/18/16  Yes Mosie Lukes, MD  clonazePAM (KLONOPIN) 1 MG tablet Take 1 tablet (1 mg total) by mouth 2 (two) times daily as needed for anxiety. Patient taking differently: Take 1 mg by mouth See admin instructions. Takes 1mg  as needed during the day for anxiety, 1mg  every night at bedtime. 01/22/16  Yes Barton Dubois, MD  clopidogrel (PLAVIX) 75 MG tablet TAKE 1 TABLET DAILY 10/18/15  Yes Mosie Lukes, MD  desvenlafaxine (PRISTIQ) 100 MG 24 hr tablet Take 100 mg by mouth daily.   Yes Historical Provider, MD  fluticasone (FLONASE) 50 MCG/ACT nasal spray USE 2 SPRAYS IN EACH NOSTRIL DAILY AS NEEDED FOR ALLERGIES 10/18/15  Yes Mosie Lukes, MD  fluticasone (FLOVENT HFA) 110 MCG/ACT inhaler Inhale 2 puffs into the lungs 2 (two) times daily. Patient  taking differently:  Inhale 2 puffs into the lungs 2 (two) times daily as needed (shortness of breath).  05/01/16  Yes Edward Saguier, PA-C  formoterol (PERFOROMIST) 20 MCG/2ML nebulizer solution Take 2 mLs (20 mcg total) by nebulization 2 (two) times daily. 01/14/15  Yes Elsie Stain, MD  glucose blood (BAYER CONTOUR NEXT TEST) test strip Use as directed once daily to check blood sugar.  DX E11.9 02/18/16  Yes Mosie Lukes, MD  lactulose (CHRONULAC) 10 GM/15ML solution Take 45 ml's 3 times daily. 03/05/16  Yes Laban Emperor Zehr, PA-C  Magnesium 400 MG TABS Take 400 mg by mouth 2 (two) times daily. Patient taking differently: Take 500-1,000 mg by mouth 2 (two) times daily. Alternating between 1 day take 500mg  twice daily, the next day take 1000mg  in the morning and 500mg  in the evening. 03/02/16  Yes Hosie Poisson, MD  metFORMIN (GLUCOPHAGE) 500 MG tablet TAKE ONE-HALF (1/2) TO ONE TABLET DAILY WITH BREAKFAST Patient taking differently: TAKE 1 TABLET EVERY MORNING. 03/15/16  Yes Mosie Lukes, MD  montelukast (SINGULAIR) 10 MG tablet Take 1 tablet (10 mg total) by mouth at bedtime. 03/23/16  Yes Tammy S Parrett, NP  Multiple Vitamins-Minerals (OCUVITE PRESERVISION) TABS Take 1 tablet by mouth 2 (two) times daily.    Yes Historical Provider, MD  Nutritional Supplements (CARNATION INSTANT BREAKFAST PO) Take 1 each by mouth 3 (three) times daily.   Yes Historical Provider, MD  ondansetron (ZOFRAN) 4 MG tablet Take 1 tablet (4 mg total) by mouth every 6 (six) hours as needed for nausea or vomiting. 03/14/15  Yes Lori P Hvozdovic, PA-C  phenytoin (DILANTIN) 100 MG ER capsule Take 2 capsules (200 mg total) by mouth at bedtime. Reported on 02/13/2016 04/16/16  Yes [name], MD  Polyethyl Glycol-Propyl Glycol (SYSTANE) 0.4-0.3 % SOLN Place 1 drop into both eyes See admin instructions. During the day as needed for dry eyes, every night at bedtime.   Yes Historical Provider, MD  potassium chloride (K-DUR) 10 MEQ  tablet Take 1 tablet (10 mEq total) by mouth 2 (two) times daily. 03/03/16  Yes Mosie Lukes, MD  ranitidine (ZANTAC) 300 MG tablet Take 1 tablet daily. 03/05/16  Yes Jessica D Zehr, PA-C  rifaximin (XIFAXAN) 550 MG TABS tablet Take 550 mg by mouth 2 (two) times daily.   Yes Historical Provider, MD  saccharomyces boulardii (FLORASTOR) 250 MG capsule Take 250 mg by mouth 2 (two) times daily.    Yes Historical Provider, MD  sodium chloride (OCEAN) 0.65 % SOLN nasal spray Place 2 sprays into both nostrils daily as needed for congestion.    Yes Historical Provider, MD  traMADol (ULTRAM) 50 MG tablet Take 2 tablets (100 mg total) by mouth every 12 (twelve) hours as needed for severe pain. 04/14/16  Yes Mosie Lukes, MD   Allergies  Allergen Reactions  . Bactrim [Sulfamethoxazole-Trimethoprim] Shortness Of Breath  . Levofloxacin Other (See Comments)    seizure  . Pneumovax [Pneumococcal Polysaccharide Vaccine] Other (See Comments)    Severe allergy-cellulitis  . Amoxicillin-Pot Clavulanate Other (See Comments)    Patient stated Drs. recommend taking Augmentin due to her PCN allergy.  . Penicillins Rash    Has patient had a PCN reaction causing immediate rash, facial/tongue/throat swelling, SOB or lightheadedness with hypotension: No Has patient had a PCN reaction causing severe rash involving mucus membranes or skin necrosis: No Has patient had a PCN reaction that required hospitalization No Has patient had a PCN reaction occurring within  the last 10 years: No If all of the above answers are "NO", then may proceed with Cephalosporin use.   . Bupropion Other (See Comments)    Hallucinating   . Haldol [Haloperidol Lactate]     Had unknown reaction many yrs ago per pt  . Morphine And Related     Confused and agitation  . Sudafed [Pseudoephedrine Hcl] Other (See Comments)    seizure   Review of Systems  Unable to perform ROS: Dementia    Physical Exam  Constitutional: She is oriented to  person, place, and time.  Cachetic, frail elderly female  HENT:  Head: Normocephalic and atraumatic.  Neck: Normal range of motion.  Pulmonary/Chest: Effort normal.  Abdominal: Soft.  Hyperactive bowel sounds  Musculoskeletal: Normal range of motion.  Neurological: She is alert and oriented to person, place, and time.  Pt is very HOH; short term memory deficits noted.  Can answer simple questions appropriately.   Skin: Skin is warm and dry.  Psychiatric:  denies A/V hallucinations Asking repeatedly when she is going home Shows some insight into that she needs help now;   Nursing note and vitals reviewed.   Vital Signs: BP (!) 121/44 (BP Location: Right Arm)   Pulse 78   Temp 98.4 F (36.9 C)   Resp 18   Ht 5\' 4"  (1.626 m)   Wt 56.7 kg (125 lb)   SpO2 98%   BMI 21.46 kg/m  Pain Assessment: No/denies pain   Pain Score: Asleep   SpO2: SpO2: 98 % O2 Device:SpO2: 98 % O2 Flow Rate: .O2 Flow Rate (L/min): 2 L/min  IO: Intake/output summary:  Intake/Output Summary (Last 24 hours) at 05/31/16 0919 Last data filed at 05/31/16 [id number]  Gross per 24 hour  Intake              200 ml  Output                0 ml  Net              200 ml    LBM: Last BM Date: 05/30/16 Baseline Weight: Weight: 56.2 kg (123 lb 12.8 oz) Most recent weight: Weight: 56.7 kg (125 lb)     Palliative Assessment/Data:   Flowsheet Rows   Flowsheet Row Most Recent Value  Intake Tab  Referral Department  Hospitalist  Unit at Time of Referral  Med/Surg Unit  Palliative Care Primary Diagnosis  Neurology  Date Notified  05/30/16  Palliative Care Type  New Palliative care  Reason for referral  Clarify Goals of Care  Date first seen by Palliative Care  05/31/16  # of days Palliative referral response time  1 Day(s)  Clinical Assessment  Palliative Performance Scale Score  40%  Pain Max last 24 hours  0  Pain Min Last 24 hours  0  Dyspnea Max Last 24 Hours  0  Dyspnea Min Last 24 hours  0  Nausea  Max Last 24 Hours  0  Nausea Min Last 24 Hours  0  Anxiety Max Last 24 Hours  0  Anxiety Min Last 24 Hours  0  Other Max Last 24 Hours  0  Psychosocial & Spiritual Assessment  Palliative Care Outcomes  Patient/Family meeting held?  Yes  Who was at the meeting?  son, daughters, pt      Time In: 1300 Time Out: 1430 Time Total: 90 min Greater than 50%  of this time was spent counseling and coordinating care related to  the above assessment and plan. Staffed with Dr. Erlinda Hong  Signed by: Dory Horn, NP   Please contact Palliative Medicine Team phone at [phone] for questions and concerns.  For individual provider: See Shea Evans

## 2016-05-31 NOTE — Progress Notes (Signed)
Patient receiving blood and IV site started to leak.  New IV site established and started to bleed too. On-call NP Schorr notified and informed that platelet count was 129.  Will continue to monitor and notify NP as needed.

## 2016-05-31 NOTE — Progress Notes (Addendum)
Progress Note   Subjective  More alert and interactive today   Objective  Vital signs in last 24 hours: Temp:  [98.4 F (36.9 C)-98.6 F (37 C)] 98.4 F (36.9 C) (08/27 0639) Pulse Rate:  [78-98] 78 (08/27 0639) Resp:  [18-20] 18 (08/27 0639) BP: (116-147)/(44-53) 121/44 (08/27 0639) SpO2:  [98 %-100 %] 98 % (08/27 0742) Weight:  [125 lb (56.7 kg)] 125 lb (56.7 kg) (08/27 0500) Last BM Date: 05/30/16  General: Alert, well-developed, in NAD Heart:  Regular rate and rhythm; no murmurs Chest: Clear to ascultation bilaterally Abdomen:  Soft, nontender and nondistended. Normal bowel sounds, without guarding, and without rebound.   Extremities:  Without edema. Neurologic:  Alert and  oriented x 2 Psych:  Alert and cooperative. Normal mood and affect.  Intake/Output from previous day: 08/26 0701 - 08/27 0700 In: 200 [IV Piggyback:200] Out: -  Intake/Output this shift: No intake/output data recorded.  Lab Results:  Recent Labs  05/29/16 0422 05/30/16 0643 05/31/16 0544  WBC 14.3* 10.3 12.2*  HGB 8.2* 7.5* 7.4*  HCT 26.6* 24.5* 24.3*  PLT 147* 130* 129*   BMET  Recent Labs  05/29/16 0422 05/30/16 0643 05/31/16 0544  NA 143 144 140  K 3.3* 3.4* 3.6  CL 115* 116* 116*  CO2 21* 21* 21*  GLUCOSE 144* 133* 148*  BUN 51* 26* 24*  CREATININE 1.05* 0.84 0.90  CALCIUM 7.8* 7.6* 7.3*   LFT  Recent Labs  05/31/16 0544  PROT 4.8*  ALBUMIN 2.1*  AST 37  ALT 19  ALKPHOS 63  BILITOT 0.7    Studies/Results: Mr Brain Wo Contrast  Result Date: 05/29/2016 CLINICAL DATA:  Confusion EXAM: MRI HEAD WITHOUT CONTRAST TECHNIQUE: Multiplanar, multiecho pulse sequences of the brain and surrounding structures were obtained without intravenous contrast. COMPARISON:  CT 05/28/2016.  MRI 02/28/2016 FINDINGS: Incomplete study. The patient was confused and was not able to complete the study. Acute infarct in the left cerebellum measuring approximately 10 x 15 mm. No other  acute infarct identified. Generalized atrophy.  Negative for hydrocephalus. Chronic microvascular ischemic change in the white matter bilaterally. Chronic infarct in the right cerebellum. Chronic infarct in the left cerebellum. Multiple areas of chronic micro hemorrhage in the cerebral hemispheres bilaterally. Most of these are cortically based hemorrhage. There is also chronic micro hemorrhage in the pons and in the cerebellum bilaterally. IMPRESSION: Acute infarct left cerebellum Atrophy and chronic ischemia Multiple areas of chronic hemorrhage in the brain which may be due to poorly controlled hypertension. Patient not able to complete the study. Electronically Signed   By: Franchot Gallo M.D.   On: 05/29/2016 17:40      Assessment & Plan   1. Diarrhea. Diarrhea somewhat difficult to assess as she is on lactulose and MgOxide however her WBC is improved and overall she is improved. Continue vanco po for a 14 day total course for suspected C diff. DC Flagyl.  2. Anemia, chronic and acute. Hb drifting lower - no active bleeding - hx of portal gastropathy and right colon AVM's. Consider transfusion for Hb <7 or sooner per primary service.   3. AMS, neurologic. MS has improved. Acute left cerebellar infarction, chronic microvascular ischemic changes and multiple areas of chronic brain hemorrhage. HE is improved. Ammonia minimally elevated at 40. Continue lactulose at current dose.   No additional recommendations. Palliative care consult is pending. GI signing off. Outpatient GI follow up with Dr. Scarlette Shorts in 1 month.   Principal  Problem:   Acute GI bleeding Active Problems:   Generalized convulsive epilepsy ([place])   COPD with chronic bronchitis ([place])   Angiodysplasia of intestine   Diabetes mellitus, type 2 ([place])   Acute encephalopathy   Hepatic cirrhosis (HCC)   Confusion   Diarrhea of presumed infectious origin    LOS: 4 days   Jahn Franchini T. Fuller Plan MD  05/31/2016, 1:34 PM [id number] 8a-5p  weekdays [phone] after 5p, weekends, holidays

## 2016-06-01 ENCOUNTER — Other Ambulatory Visit: Payer: Self-pay

## 2016-06-01 DIAGNOSIS — K7290 Hepatic failure, unspecified without coma: Secondary | ICD-10-CM

## 2016-06-01 LAB — CBC
HCT: 26.9 % — ABNORMAL LOW (ref 36.0–46.0)
Hemoglobin: 8.4 g/dL — ABNORMAL LOW (ref 12.0–15.0)
MCH: 29 pg (ref 26.0–34.0)
MCHC: 31.2 g/dL (ref 30.0–36.0)
MCV: 92.8 fL (ref 78.0–100.0)
PLATELETS: 125 10*3/uL — AB (ref 150–400)
RBC: 2.9 MIL/uL — AB (ref 3.87–5.11)
RDW: 16.6 % — AB (ref 11.5–15.5)
WBC: 11.2 10*3/uL — AB (ref 4.0–10.5)

## 2016-06-01 LAB — BASIC METABOLIC PANEL
ANION GAP: 6 (ref 5–15)
BUN: 19 mg/dL (ref 6–20)
CALCIUM: 7.6 mg/dL — AB (ref 8.9–10.3)
CO2: 20 mmol/L — ABNORMAL LOW (ref 22–32)
Chloride: 113 mmol/L — ABNORMAL HIGH (ref 101–111)
Creatinine, Ser: 0.85 mg/dL (ref 0.44–1.00)
Glucose, Bld: 129 mg/dL — ABNORMAL HIGH (ref 65–99)
POTASSIUM: 4.2 mmol/L (ref 3.5–5.1)
SODIUM: 139 mmol/L (ref 135–145)

## 2016-06-01 LAB — GLUCOSE, CAPILLARY
GLUCOSE-CAPILLARY: 118 mg/dL — AB (ref 65–99)
GLUCOSE-CAPILLARY: 158 mg/dL — AB (ref 65–99)
Glucose-Capillary: 131 mg/dL — ABNORMAL HIGH (ref 65–99)
Glucose-Capillary: 171 mg/dL — ABNORMAL HIGH (ref 65–99)
Glucose-Capillary: 164 mg/dL — ABNORMAL HIGH (ref 65–99)

## 2016-06-01 LAB — PROTIME-INR
INR: 1.64
PROTHROMBIN TIME: 19.6 s — AB (ref 11.4–15.2)

## 2016-06-01 LAB — TYPE AND SCREEN
ABO/RH(D): O POS
ANTIBODY SCREEN: NEGATIVE
UNIT DIVISION: 0

## 2016-06-01 LAB — AMMONIA: AMMONIA: 35 umol/L (ref 9–35)

## 2016-06-01 MED ORDER — IPRATROPIUM-ALBUTEROL 0.5-2.5 (3) MG/3ML IN SOLN: 3.0000 mL | Freq: Four times a day (QID) | RESPIRATORY_TRACT | Status: DC | PRN

## 2016-06-01 NOTE — Care Management Important Message (Signed)
Important Message  Patient Details  Name: Monique Fleming MRN: [id number] Date of Birth: 03-30-1929   Medicare Important Message Given:  Yes    Rue Valladares Abena 06/01/2016, 12:20 PM

## 2016-06-01 NOTE — Progress Notes (Signed)
Daily Rounding Note  06/01/2016, 10:54 AM  LOS: 5 days   SUBJECTIVE:   Chief complaint:  diarrhea   Less frequent and smaller volume BMs.  No stools today.  "I think I'm better".   No abdominal pain, no nausea.  Tolerating soft diet  OBJECTIVE:         Vital signs in last 24 hours:    Temp:  [97.6 F (36.4 C)-99.2 F (37.3 C)] 98.5 F (36.9 C) (08/28 0600) Pulse Rate:  [88-141] 95 (08/28 0600) Resp:  [17-18] 18 (08/28 0600) BP: (115-142)/(41-58) 121/53 (08/28 0600) SpO2:  [95 %-100 %] 95 % (08/28 0600) Weight:  [59.5 kg (131 lb 2.8 oz)] 59.5 kg (131 lb 2.8 oz) (08/28 0600) Last BM Date: 05/31/16 Filed Weights   05/30/16 0642 05/31/16 0500 06/01/16 0600  Weight: 55.1 kg (121 lb 8 oz) 56.7 kg (125 lb) 59.5 kg (131 lb 2.8 oz)   General: frail, pleasant, non-toxic but looks moderately unwell.    Heart: RRR Chest: clear bil.  No cough or labored breathing Abdomen: soft, slightly protuberant.  No fluid wave.  Minor, non-focal tenderness   Extremities: no CCE Neuro/Psych:  Pleasant, oriented x 3.  Moves all limbs, only slight weakness in right UE and no aphasia.   Intake/Output from previous day: 08/27 0701 - 08/28 0700 In: 1088 [P.O.:480; Blood:608] Out: -   Intake/Output this shift: No intake/output data recorded.  Lab Results:  Recent Labs  05/30/16 0643 05/31/16 0544 06/01/16 0607  WBC 10.3 12.2* 11.2*  HGB 7.5* 7.4* 8.4*  HCT 24.5* 24.3* 26.9*  PLT 130* 129* 125*   BMET  Recent Labs  05/30/16 0643 05/31/16 0544 06/01/16 0607  NA 144 140 139  K 3.4* 3.6 4.2  CL 116* 116* 113*  CO2 21* 21* 20*  GLUCOSE 133* 148* 129*  BUN 26* 24* 19  CREATININE 0.84 0.90 0.85  CALCIUM 7.6* 7.3* 7.6*   LFT  Recent Labs  05/30/16 0643 05/31/16 0544  PROT 4.8* 4.8*  ALBUMIN 2.1* 2.1*  AST 42* 37  ALT 19 19  ALKPHOS 59 63  BILITOT 0.7 0.7   PT/INR  Recent Labs  06/01/16 0607  LABPROT 19.6*  INR  1.64   Hepatitis Panel No results for input(s): HEPBSAG, HCVAB, HEPAIGM, HEPBIGM in the last 72 hours.  Studies/Results: No results found.   Scheduled Meds: . arformoterol  15 mcg Nebulization BID  . budesonide  0.25 mg Nebulization BID  . clonazePAM  1 mg Oral QHS  . desvenlafaxine  100 mg Oral Daily  . feeding supplement (ENSURE ENLIVE)  237 mL Oral BID BM  . fluticasone  2 spray Each Nare Daily  . insulin aspart  0-9 Units Subcutaneous Q4H  . lactulose  20 g Oral TID  . montelukast  10 mg Oral QHS  . pantoprazole  40 mg Intravenous Q12H  . phenytoin  200 mg Oral QHS  . vancomycin  250 mg Oral Q6H   Continuous Infusions:  PRN Meds:.albuterol, clonazePAM, ondansetron **OR** ondansetron (ZOFRAN) IV, traMADol   ASSESMENT:   *  Diarrhea.   Pan colitis per CT of 8/23.   Empiric vanc replaced Flagyl for ? C diff though c diff toxin and antigen, stool pathogen panel all negative.    S/p 2008 right colectomy, ileocolic anastomosis for colon cancer.   *  Acute on chronic anemia.  Hx portal gastropathy and right colon AVM's but no active bleeding reported.   *  Cirrhosis and hepatic encephalopathy.   Lactulose dose decreased 8/27.   Renal function OK.   *  New, acute CVA  left cerebellum, Multiple areas of chronic hemorrhage per MRI 8/25.  Worsening of baseline dementia though by today's exam not acutely confused or especially impaired.   *  Thrombocytopenia.    *  Coagulopathy.     PLAN   *  Will have Dr Silverio Decamp reach out to pt's dtr Ronnald Nian at [phone], mobile, or [phone], home phone but GI is signing off. Complete 2 weeks of Vancomycin (per Dr Fuller Plan). Leave lactulose in place at 20 grams TID,  But if diarrhea persist, consider cutting back on this and starting Rifaximin.     Pt is DNR.      Monique Fleming  06/01/2016, 10:54 AM Pager: [phone]

## 2016-06-01 NOTE — Care Management Important Message (Signed)
Important Message  Patient Details  Name: Monique Fleming MRN: [id number] Date of Birth: 10/05/1929   Medicare Important Message Given:  Yes    Nathen May 06/01/2016, 12:19 PM

## 2016-06-01 NOTE — Progress Notes (Signed)
PROGRESS NOTE  Monique Fleming [id number] DOB: 1928-11-28 DOA: 05/26/2016 PCP: Penni Homans, MD  HPI/Recap of past 24 hours:  Alert, Calm, denies hallucination , report feeling better Significant dyspnea when getting up to use the bedside commode   Assessment/Plan: Principal Problem:   Acute GI bleeding Active Problems:   Generalized convulsive epilepsy ([place])   COPD with chronic bronchitis ([place])   Angiodysplasia of intestine   Diabetes mellitus, type 2 ([place])   Acute encephalopathy   Hepatic cirrhosis ([place])   Confusion   Diarrhea of presumed infectious origin   Palliative care encounter   1. Acute GI bleeding /Acute blood loss anemiashe was briefly on octreotide initially on presentation due to history of portal hypertension and cirrhosis - gi bleed likely from colitis, octreotide discontinued,  S/p 2units of prbc transfusion on 8/23. GI consulted.       hgb 7.5-7.4, may need to be transfused again if hgb continue trending down. 2. Diffuse pan colitis with diarrhea - negative stool studies. GI recommended treat for C diff due to high suspicion for c diff colitis 3. Acute encephalopathy probably secondary to hepatic encephalopathy/ and metabolic encephalopathy from colitis - on lactulose.  Xifaxan stopped per GI recommendation due to concerning for c diff.   For the last two days, patient has been awake in am, then become very drowsy, not arousable to sternal rubs in pm,  CT head/abg unremarkable, EEG with metabolic changes, but no seizure spikes, ammonia better, all labs continue to improve, b12 is high /folate wnl /rpr/hiv negative, dilantin level is low, MRI + acute infarct left cerebellum 4. COPD - no wheezing, on room air, Continue inhalers. 5. Diabetes mellitus type 2 - noninsulin dependent, placed on sliding scale coverage. 6. Hypokalemia/hypomegnesemia: replace mag/k 7. Acute infarct left cerebellum with chronic hemorrhage in the brain: not a candidate for antiplatelet  due to gi bleed and chronic hemorrhage in the brain. Not a candidate for statin due to cirrhosis, option is limited, overall prognosis is poor, I have talked to patient's daughter betsy who agrees to palliative team consult. Family meeting with palliative team at 1pm on 8/27, appreciate palliative team input 8. Watery diarrhea, patient fulled out rectal tube, RN reported exploasive diarrhea on 8/27, will decreased lactulose to tid, monitor mental status and ammonia level    DVT prophylaxis: SCDs. Code Status: DO NOT RESUSCITATE.  Family Communication: patient Disposition Plan: Home.  Consults called: Gastroenterologist, palliative team   Procedures:  prbc transfusion on 8/23 (2units) and 8/27 (one unit)  Antibiotics:  Oral vanc/ iv flagyl   Objective: BP (!) 109/44 (BP Location: Left Arm)   Pulse 89   Temp 98.7 F (37.1 C) (Oral)   Resp 20   Ht 5\' 4"  (1.626 m)   Wt 59.5 kg (131 lb 2.8 oz)   SpO2 100%   BMI 22.52 kg/m   Intake/Output Summary (Last 24 hours) at 06/01/16 1529 Last data filed at 06/01/16 1121  Gross per 24 hour  Intake             1088 ml  Output              500 ml  Net              588 ml   Filed Weights   05/30/16 0642 05/31/16 0500 06/01/16 0600  Weight: 55.1 kg (121 lb 8 oz) 56.7 kg (125 lb) 59.5 kg (131 lb 2.8 oz)    Exam:   General:  Frail, but Fully awake , alert, pleasant, NAD  Cardiovascular: RRR with pac's  Respiratory: mild scattered wheezing when getting up from bed to bedside commode   Abdomen: Soft/ND/NT, positive BS  Musculoskeletal: No Edema  Neuro: know she is at [place], oriented to person, know it is 2017  Data Reviewed: Basic Metabolic Panel:  Recent Labs Lab 05/27/16 1740  05/28/16 1913 05/29/16 0422 05/30/16 0643 05/31/16 0544 06/01/16 0607  NA 142  < > 140 143 144 140 139  K 4.6  < > 3.7 3.3* 3.4* 3.6 4.2  CL 115*  < > 114* 115* 116* 116* 113*  CO2 21*  < > 21* 21* 21* 21* 20*  GLUCOSE 218*  < > 241*  144* 133* 148* 129*  BUN 80*  < > 63* 51* 26* 24* 19  CREATININE 1.34*  < > 1.15* 1.05* 0.84 0.90 0.85  CALCIUM 8.3*  < > 7.7* 7.8* 7.6* 7.3* 7.6*  MG 1.9  --   --  1.7 1.5* 2.0  --   < > = values in this interval not displayed. Liver Function Tests:  Recent Labs Lab 05/26/16 2200 05/30/16 0643 05/31/16 0544  AST 45* 42* 37  ALT 20 19 19   ALKPHOS 75 59 63  BILITOT 0.9 0.7 0.7  PROT 5.2* 4.8* 4.8*  ALBUMIN 2.4* 2.1* 2.1*   No results for input(s): LIPASE, AMYLASE in the last 168 hours.  Recent Labs Lab 05/28/16 1912 05/29/16 0422 05/30/16 0643 05/31/16 0544 06/01/16 0607  AMMONIA 31 32 41* 40* 35   CBC:  Recent Labs Lab 05/26/16 2200  05/28/16 1913 05/29/16 0422 05/30/16 0643 05/31/16 0544 06/01/16 0607  WBC 25.2*  < > 15.3* 14.3* 10.3 12.2* 11.2*  NEUTROABS 19.4*  --   --   --   --   --   --   HGB 6.8*  < > 8.1* 8.2* 7.5* 7.4* 8.4*  HCT 22.3*  < > 25.4* 26.6* 24.5* 24.3* 26.9*  MCV 94.9  < > 92.7 94.0 93.9 93.8 92.8  PLT 255  < > 131* 147* 130* 129* 125*  < > = values in this interval not displayed. Cardiac Enzymes:   No results for input(s): CKTOTAL, CKMB, CKMBINDEX, TROPONINI in the last 168 hours. BNP (last 3 results)  Recent Labs  02/27/16 1520  BNP 535.4*    ProBNP (last 3 results) No results for input(s): PROBNP in the last 8760 hours.  CBG:  Recent Labs Lab 05/31/16 2021 05/31/16 2355 06/01/16 0407 06/01/16 0839 06/01/16 1228  GLUCAP 189* 206* 118* 131* 171*    Recent Results (from the past 240 hour(s))  C difficile quick scan w PCR reflex     Status: None   Collection Time: 05/26/16 10:40 PM  Result Value Ref Range Status   C Diff antigen NEGATIVE NEGATIVE Final   C Diff toxin NEGATIVE NEGATIVE Final   C Diff interpretation No C. difficile detected.  Final  Urine culture     Status: None   Collection Time: 05/26/16 10:45 PM  Result Value Ref Range Status   Specimen Description URINE, RANDOM  Final   Special Requests NONE   Final   Culture NO GROWTH  Final   Report Status 05/28/2016 FINAL  Final  Blood culture (routine x 2)     Status: None (Preliminary result)   Collection Time: 05/26/16 11:30 PM  Result Value Ref Range Status   Specimen Description BLOOD LEFT HAND  Final   Special Requests IN PEDIATRIC  BOTTLE 3CC  Final   Culture NO GROWTH 4 DAYS  Final   Report Status PENDING  Incomplete  Blood culture (routine x 2)     Status: None (Preliminary result)   Collection Time: 05/26/16 11:35 PM  Result Value Ref Range Status   Specimen Description BLOOD RIGHT HAND  Final   Special Requests BOTTLES DRAWN AEROBIC AND ANAEROBIC 5CC  Final   Culture NO GROWTH 4 DAYS  Final   Report Status PENDING  Incomplete  Gastrointestinal Panel by PCR , Stool     Status: None   Collection Time: 05/27/16  3:07 AM  Result Value Ref Range Status   Campylobacter species NOT DETECTED NOT DETECTED Final   Plesimonas shigelloides NOT DETECTED NOT DETECTED Final   Salmonella species NOT DETECTED NOT DETECTED Final   Yersinia enterocolitica NOT DETECTED NOT DETECTED Final   Vibrio species NOT DETECTED NOT DETECTED Final   Vibrio cholerae NOT DETECTED NOT DETECTED Final   Enteroaggregative E coli (EAEC) NOT DETECTED NOT DETECTED Final   Enteropathogenic E coli (EPEC) NOT DETECTED NOT DETECTED Final   Enterotoxigenic E coli (ETEC) NOT DETECTED NOT DETECTED Final   Shiga like toxin producing E coli (STEC) NOT DETECTED NOT DETECTED Final   E. coli O157 NOT DETECTED NOT DETECTED Final   Shigella/Enteroinvasive E coli (EIEC) NOT DETECTED NOT DETECTED Final   Cryptosporidium NOT DETECTED NOT DETECTED Final   Cyclospora cayetanensis NOT DETECTED NOT DETECTED Final   Entamoeba histolytica NOT DETECTED NOT DETECTED Final   Giardia lamblia NOT DETECTED NOT DETECTED Final   Adenovirus F40/41 NOT DETECTED NOT DETECTED Final   Astrovirus NOT DETECTED NOT DETECTED Final   Norovirus GI/GII NOT DETECTED NOT DETECTED Final   Rotavirus A NOT  DETECTED NOT DETECTED Final   Sapovirus (I, II, IV, and V) NOT DETECTED NOT DETECTED Final     Studies: No results found.  Scheduled Meds: . arformoterol  15 mcg Nebulization BID  . budesonide  0.25 mg Nebulization BID  . clonazePAM  1 mg Oral QHS  . desvenlafaxine  100 mg Oral Daily  . feeding supplement (ENSURE ENLIVE)  237 mL Oral BID BM  . fluticasone  2 spray Each Nare Daily  . insulin aspart  0-9 Units Subcutaneous Q4H  . lactulose  20 g Oral TID  . montelukast  10 mg Oral QHS  . pantoprazole  40 mg Intravenous Q12H  . phenytoin  200 mg Oral QHS  . vancomycin  250 mg Oral Q6H    Continuous Infusions:     Time spent: 74mins  Ranulfo Kall MD, PhD  [facility] Pager [phone]. If 7PM-7AM, please contact night-coverage at [email or web address], password [facility] 06/01/2016, 3:29 PM  LOS: 5 days

## 2016-06-01 NOTE — Progress Notes (Signed)
Daily Progress Note   Patient Name: Monique Fleming       Date: 06/01/2016 DOB: [birth year]  Age: 82 y.o. MRN#: [id number] Attending Physician: Florencia Reasons, MD Primary Care Physician: Penni Homans, MD Admit Date: 05/26/2016  Reason for Consultation/Follow-up: Establishing goals of care  Subjective: Monique Fleming is sleeping comfortably when I came to visit. She awoken easily and smiled. Says she feels much better today. She has no complaints.   I did speak with daughter, Monique Fleming, today. She is awaiting to speak with GI provider. She does not believe her mother has C. diff. She is frustrated as she does not feel that they are getting their questions answered. She worries that her mother will be discharged and that they will be back in the same situation with the same questions. She does desire full work up as indicated to determine her mother's situation. They are very reasonable and open to comfort options and limit setting - however, they do not feel like they have a full pictures of what is going on with their mother to be able to make these decisions. 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   I will speak more with [facility] after she has a chance to speak with GI and have her questions answered.   Length of Stay: 5  Current Medications: Scheduled Meds:  . arformoterol  15 mcg Nebulization BID  . budesonide  0.25 mg Nebulization BID  . clonazePAM  1 mg Oral QHS  . desvenlafaxine  100 mg Oral Daily  . feeding supplement (ENSURE ENLIVE)  237 mL Oral BID BM  . fluticasone  2 spray Each Nare Daily  . insulin aspart  0-9 Units Subcutaneous Q4H  . lactulose  20 g Oral TID  . montelukast  10 mg  Oral QHS  . pantoprazole  40 mg Intravenous Q12H  . phenytoin  200 mg Oral QHS  . vancomycin  250 mg Oral Q6H    Continuous Infusions:    PRN Meds: albuterol, clonazePAM, ondansetron **OR** ondansetron (ZOFRAN) IV, traMADol  Physical Exam  Constitutional: She appears well-developed.  HENT:  Head: Normocephalic and atraumatic.  Cardiovascular: Normal rate.   Pulmonary/Chest: Effort normal. No accessory  muscle usage. No tachypnea. No respiratory distress.  Abdominal: Soft. Normal appearance. There is generalized tenderness.  Neurological: She is alert. She is disoriented.            Vital Signs: BP (!) 121/53 (BP Location: Right Arm)   Pulse 95   Temp 98.5 F (36.9 C) (Oral)   Resp 18   Ht 5\' 4"  (1.626 m)   Wt 59.5 kg (131 lb 2.8 oz)   SpO2 95%   BMI 22.52 kg/m  SpO2: SpO2: 95 % O2 Device: O2 Device: Not Delivered O2 Flow Rate: O2 Flow Rate (L/min): 2 L/min  Intake/output summary:  Intake/Output Summary (Last 24 hours) at 06/01/16 1214 Last data filed at 06/01/16 1121  Gross per 24 hour  Intake             1088 ml  Output              500 ml  Net              588 ml   LBM: Last BM Date: 05/31/16 Baseline Weight: Weight: 56.2 kg (123 lb 12.8 oz) Most recent weight: Weight: 59.5 kg (131 lb 2.8 oz)       Palliative Assessment/Data:    Flowsheet Rows   Flowsheet Row Most Recent Value  Intake Tab  Referral Department  Hospitalist  Unit at Time of Referral  Med/Surg Unit  Palliative Care Primary Diagnosis  Neurology  Date Notified  05/30/16  Palliative Care Type  New Palliative care  Reason for referral  Clarify Goals of Care  Date of Admission  05/26/16  Date first seen by Palliative Care  05/31/16  # of days Palliative referral response time  1 Day(s)  # of days IP prior to Palliative referral  4  Clinical Assessment  Palliative Performance Scale Score  40%  Pain Max last 24 hours  0  Pain Min Last 24 hours  0  Dyspnea Max Last 24 Hours  0  Dyspnea Min  Last 24 hours  0  Nausea Max Last 24 Hours  0  Nausea Min Last 24 Hours  0  Anxiety Max Last 24 Hours  0  Anxiety Min Last 24 Hours  0  Other Max Last 24 Hours  0  Psychosocial & Spiritual Assessment  Palliative Care Outcomes  Patient/Family meeting held?  Yes  Who was at the meeting?  son, daughters, pt      Patient Active Problem List   Diagnosis Date Noted  . Palliative care encounter   . Confusion   . Diarrhea of presumed infectious origin   . Acute GI bleeding 05/27/2016  . Epistaxis 04/14/2016  . Spinal stenosis 03/29/2016  . Hepatic cirrhosis (Monique Fleming) 03/10/2016  . Encephalopathy, hepatic (Monique Fleming) 02/28/2016  . Lactic acidosis 02/28/2016  . Acute CHF (congestive heart failure) (Monique Fleming) 02/28/2016  . Dementia 02/13/2016  . Gait instability   . Depression with anxiety   . Seizures (Monique Fleming)   . Abnormal EEG   . Normocytic anemia   . Acute encephalopathy 01/18/2016  . Speech disturbance 01/18/2016  . Prolonged Q-T interval on ECG 01/18/2016  . Right shoulder pain 11/07/2015  . Elevated BP 07/09/2015  . Cold sore 04/25/2015  . Chronic respiratory failure with hypoxia ([place]) 04/25/2015  . Rib pain on left side 02/21/2015  . Pedal edema 02/07/2015  . Pain in joint, ankle and foot 02/07/2015  . Diabetes mellitus, type 2 (Monique Fleming) 12/21/2014  . Chest pain, mid sternal   .  General weakness 08/22/2014  . Dehydration 08/22/2014  . Acute diarrhea 08/22/2014  . Hemorrhoids 08/22/2014  . Rectal bleeding 07/04/2014  . Nonspecific (abnormal) findings on radiological and other examination of gastrointestinal tract 07/04/2014  . Iron deficiency anemia  06/27/2014  . Mild cognitive disorder 04/03/2014  . COPD (chronic obstructive pulmonary disease) with chronic bronchitis Gold B 03/14/2014  . Depression, major, recurrent (Monique Fleming) 03/06/2014  . Macular degeneration 02/18/2014  . Anxiety state 01/08/2014  . Carotid disease, bilateral (Monique Fleming) 01/02/2014  . Burning with urination 12/17/2013  .  Aortic stenosis 12/17/2013  . Wounds, multiple 06/28/2013  . Thrombocytopenia (Monique Fleming) 04/01/2013  . Cerebral artery occlusion (Monique Fleming) 03/11/2013  . Cardiac disease 03/11/2013  . Nasal septal perforation 07/20/2012  . Hyperlipidemia 10/20/2011  . Reaction to Pneumovax immunization 03/07/2011  . ACTINIC KERATOSIS 08/22/2010  . NEOPLASM OF UNCERTAIN BEHAVIOR OF SKIN 05/28/2010  . Angiodysplasia of intestine 02/25/2010  . ANEMIA-B12 DEFICIENCY 12/30/2009  . Megaloblastic anemia due to B12 deficiency 12/30/2009  . Iron deficiency anemia 12/23/2009  . BREAST PAIN 10/09/2009  . CIRRHOSIS 03/08/2009  . DIVERTICULOSIS-COLON 02/28/2009  . History of malignant neoplasm of large intestine 02/28/2009  . PERSONAL HX COLONIC POLYPS 02/28/2009  . UNSPECIFIED BREAST DISORDER 02/13/2009  . Generalized convulsive epilepsy ([place]) 10/08/2008  . NECK PAIN 09/10/2008  . COPD with chronic bronchitis ([place]) 05/29/2008  . Malaise and fatigue 03/26/2008  . GERD 03/07/2008  . Irritable bowel syndrome 12/07/2007  . TOBACCO ABUSE, Hx of 10/05/2007  . INSOMNIA, CHRONIC 10/05/2007  . ATHEROSCLEROSIS 10/05/2007  . SLEEP RELATED MOVEMENT DISORDER UNSPECIFIED 10/05/2007  . Type 2 diabetes mellitus with vascular disease ([place]) 03/02/2007  . ANXIETY 03/02/2007  . PERIPHERAL VASCULAR DISEASE 03/02/2007  . RENAL CYST, LEFT 03/02/2007  . TRANSIENT ISCHEMIC ATTACK, HX OF 03/02/2007    Palliative Care Assessment & Plan   Patient Profile: 82 y.o. female  with past medical history of Cirrhosis of the liver, COPD, diabetes, history of adenocarcinoma of the colon diagnosed in 2008, dementia, admitted on 05/26/2016 with increased confusion, abdominal discomfort, and multiple episodes of diarrhea. CT abd shows pan colitis suspicious for C. diff even though stool studies negative - treating with po vancomycin. New MRI reveals acute left cerebellar infarct, atrophy, chronic ischemia chronic hemorrhage. Hgb has continued to trend down  this hospitalization requiring blood transfusion last 05/31/16. Diarrhea and mental state improving slowly.   Assessment: Lying in bed, resting comfortably.   Recommendations/Plan:  Pain: Continue with tramadol as needed  Anxiety/depression: Continue with Pristiq and Klonopin  Confusion/agitation: Likely multifactorial in the setting of dementia, cirrhosis. Lactulose titrated down to home dose yesterday.   Goals of Care and Additional Recommendations:  Limitations on Scope of Treatment: Avoid Hospitalization  Code Status:    Code Status Orders        Start     Ordered   05/27/16 0305  Do not attempt resuscitation (DNR)  Continuous    Question Answer Comment  In the event of cardiac or respiratory ARREST Do not call a "code blue"   In the event of cardiac or respiratory ARREST Do not perform Intubation, CPR, defibrillation or ACLS   In the event of cardiac or respiratory ARREST Use medication by any route, position, wound care, and other measures to relive pain and suffering. May use oxygen, suction and manual treatment of airway obstruction as needed for comfort.      05/27/16 [id number]    Code Status History    Date Active Date Inactive Code  Status Order ID Comments User Context   02/27/2016  8:23 PM 02/27/2016  8:23 PM DNR [id number]  Norval Morton, MD Inpatient   02/27/2016  8:23 PM 03/02/2016  3:53 PM DNR [id number]  Norval Morton, MD Inpatient   01/18/2016 11:54 PM 01/23/2016  4:54 PM DNR [id number]  Vianne Bulls, MD Inpatient   08/22/2014  5:39 PM 08/24/2014  9:19 PM Full Code [id number]  Bonnielee Haff, MD Inpatient   03/14/2014  6:06 PM 03/15/2014  7:18 PM DNR [id number]  Samuella Cota, MD Inpatient    Advance Directive Documentation   Flowsheet Row Most Recent Value  Type of Advance Directive  Healthcare Power of Attorney  Pre-existing out of facility DNR order (yellow form or pink MOST form)  No data  "MOST" Form in Place?  No data       Prognosis:   Prognosis  appears to be poor with multiple acute on chronic medical issues including cirrhosis, dementia, acute on chronic anemia and new stroke - not a candidate for anticoagulation. Could likely be < 6 months.   Discharge Planning:  To be determined. Family likely not open to facilities. May be open to hospice.    Thank you for allowing the Palliative Medicine Team to assist in the care of this patient.   Time In: 1015 Time Out: 1100 Total Time 48min Prolonged Time Billed  no       Greater than 50%  of this time was spent counseling and coordinating care related to the above assessment and plan.  Pershing Proud, NP  Please contact Palliative Medicine Team phone at [phone] for questions and concerns.

## 2016-06-02 ENCOUNTER — Encounter: Payer: Self-pay | Admitting: Family

## 2016-06-02 DIAGNOSIS — R140 Abdominal distension (gaseous): Secondary | ICD-10-CM

## 2016-06-02 LAB — AMMONIA: AMMONIA: 31 umol/L (ref 9–35)

## 2016-06-02 LAB — BASIC METABOLIC PANEL
ANION GAP: 5 (ref 5–15)
BUN: 15 mg/dL (ref 6–20)
CHLORIDE: 113 mmol/L — AB (ref 101–111)
CO2: 19 mmol/L — AB (ref 22–32)
Calcium: 7.4 mg/dL — ABNORMAL LOW (ref 8.9–10.3)
Creatinine, Ser: 0.77 mg/dL (ref 0.44–1.00)
GFR calc non Af Amer: >60 mL/min (ref >60)
GLUCOSE: 202 mg/dL — AB (ref 65–99)
POTASSIUM: 4.1 mmol/L (ref 3.5–5.1)
Sodium: 137 mmol/L (ref 135–145)

## 2016-06-02 LAB — GLUCOSE, CAPILLARY
GLUCOSE-CAPILLARY: 125 mg/dL — AB (ref 65–99)
GLUCOSE-CAPILLARY: 178 mg/dL — AB (ref 65–99)
GLUCOSE-CAPILLARY: 204 mg/dL — AB (ref 65–99)
GLUCOSE-CAPILLARY: 205 mg/dL — AB (ref 65–99)
GLUCOSE-CAPILLARY: 223 mg/dL — AB (ref 65–99)
Glucose-Capillary: 158 mg/dL — ABNORMAL HIGH (ref 65–99)

## 2016-06-02 LAB — CBC
HEMATOCRIT: 26.6 % — AB (ref 36.0–46.0)
HEMOGLOBIN: 8.1 g/dL — AB (ref 12.0–15.0)
MCH: 28.3 pg (ref 26.0–34.0)
MCHC: 30.5 g/dL (ref 30.0–36.0)
MCV: 93 fL (ref 78.0–100.0)
Platelets: 125 10*3/uL — ABNORMAL LOW (ref 150–400)
RBC: 2.86 MIL/uL — AB (ref 3.87–5.11)
RDW: 16.6 % — ABNORMAL HIGH (ref 11.5–15.5)
WBC: 8.5 10*3/uL (ref 4.0–10.5)

## 2016-06-02 LAB — MAGNESIUM: Magnesium: 1.6 mg/dL — ABNORMAL LOW (ref 1.7–2.4)

## 2016-06-02 MED ORDER — LACTULOSE 10 GM/15ML PO SOLN
20.0000 g | Freq: Two times a day (BID) | ORAL | Status: DC
  Administered 2016-06-02 – 2016-06-03 (×2): 20 g via ORAL
  Filled 2016-06-02 (×2): qty 30

## 2016-06-02 MED ORDER — SODIUM BICARBONATE 650 MG PO TABS
650.0000 mg | ORAL_TABLET | Freq: Every day | ORAL | Status: DC
  Administered 2016-06-02 – 2016-06-03 (×2): 650 mg via ORAL
  Filled 2016-06-02 (×2): qty 1

## 2016-06-02 MED ORDER — INSULIN ASPART 100 UNIT/ML ~~LOC~~ SOLN
0.0000 [IU] | Freq: Three times a day (TID) | SUBCUTANEOUS | Status: DC
  Administered 2016-06-02 (×2): 3 [IU] via SUBCUTANEOUS
  Administered 2016-06-03: 2 [IU] via SUBCUTANEOUS
  Administered 2016-06-03: 3 [IU] via SUBCUTANEOUS
  Administered 2016-06-03: 2 [IU] via SUBCUTANEOUS
  Administered 2016-06-03 – 2016-06-04 (×2): 3 [IU] via SUBCUTANEOUS
  Administered 2016-06-04: 2 [IU] via SUBCUTANEOUS

## 2016-06-02 MED ORDER — MAGNESIUM SULFATE 50 % IJ SOLN
3.0000 g | Freq: Once | INTRAVENOUS | Status: AC
  Administered 2016-06-02: 3 g via INTRAVENOUS
  Filled 2016-06-02: qty 6

## 2016-06-02 NOTE — Progress Notes (Signed)
Daily Progress Note   Patient Name: Monique Fleming       Date: 06/02/2016 DOB: [birth year]  Age: 82 y.o. MRN#: [id number] Attending Physician: Florencia Reasons, MD Primary Care Physician: Penni Homans, MD Admit Date: 05/26/2016  Reason for Consultation/Follow-up: Establishing goals of care  Subjective: I met again today with Monique Fleming. She is very pleasant and is happy to think about going home soon. She does say that she is feeling better. When I ask about her diarrhea she says "this is a little better." Still documented 10+ stools over the past 24 hours.   I had multiple conversations with daughter, Monique Fleming, over the phone today. Monique Fleming is frustrated with the lack of progress her mother has made and is not satisfied with the answers she has received thus far. They are struggling but she admits that this is not like her mother on a ventilator or on chemo that is not working and making a decision for comfort. They want to feel sure that there is nothing reversible going on we are not addressing.   After further discussion with Dr. Ronie Spies says she and her sister are planning to make arrangements to take their mother home tomorrow and will talk with her tonight about involving hospice. Monique Fleming was very overwhelmed and I will try and meet with them when they visit in the morning for any further questions/concerns and support.   Length of Stay: 6  Current Medications: Scheduled Meds:  . arformoterol  15 mcg Nebulization BID  . budesonide  0.25 mg Nebulization BID  . clonazePAM  1 mg Oral QHS  . desvenlafaxine  100 mg Oral Daily  . feeding supplement (ENSURE ENLIVE)  237 mL Oral BID BM  . fluticasone  2 spray Each Nare Daily  . insulin aspart  0-9 Units Subcutaneous Q4H  . lactulose  20 g Oral TID  .  montelukast  10 mg Oral QHS  . pantoprazole  40 mg Intravenous Q12H  . phenytoin  200 mg Oral QHS  . sodium bicarbonate  650 mg Oral Daily  . vancomycin  250 mg Oral Q6H    Continuous Infusions:    PRN Meds: albuterol, clonazePAM, ipratropium-albuterol, ondansetron **OR** ondansetron (ZOFRAN) IV, traMADol  Physical Exam  Constitutional: She appears well-developed.  HENT:  Head: Normocephalic and atraumatic.  Cardiovascular:  Normal rate.   Pulmonary/Chest: Effort normal. No accessory muscle usage. No tachypnea. No respiratory distress.  Abdominal: Soft. Normal appearance. She exhibits distension. There is generalized tenderness.  Neurological: She is alert. She is disoriented.            Vital Signs: BP (!) 129/41 (BP Location: Right Arm)   Pulse 84   Temp 98.9 F (37.2 C) (Oral)   Resp 18   Ht 5\' 4"  (1.626 m)   Wt 59.6 kg (131 lb 6.3 oz)   SpO2 97%   BMI 22.55 kg/m  SpO2: SpO2: 97 % O2 Device: O2 Device: Nasal Cannula O2 Flow Rate: O2 Flow Rate (L/min): 3 L/min  Intake/output summary:   Intake/Output Summary (Last 24 hours) at 06/02/16 1353 Last data filed at 06/02/16 1336  Gross per 24 hour  Intake             1420 ml  Output              104 ml  Net             1316 ml   LBM: Last BM Date: 06/02/16 Baseline Weight: Weight: 56.2 kg (123 lb 12.8 oz) Most recent weight: Weight: 59.6 kg (131 lb 6.3 oz)       Palliative Assessment/Data:    Flowsheet Rows   Flowsheet Row Most Recent Value  Intake Tab  Referral Department  Hospitalist  Unit at Time of Referral  Med/Surg Unit  Palliative Care Primary Diagnosis  Neurology  Date Notified  05/30/16  Palliative Care Type  New Palliative care  Reason for referral  Clarify Goals of Care  Date of Admission  05/26/16  Date first seen by Palliative Care  05/31/16  # of days Palliative referral response time  1 Day(s)  # of days IP prior to Palliative referral  4  Clinical Assessment  Palliative Performance Scale  Score  40%  Pain Max last 24 hours  0  Pain Min Last 24 hours  0  Dyspnea Max Last 24 Hours  0  Dyspnea Min Last 24 hours  0  Nausea Max Last 24 Hours  0  Nausea Min Last 24 Hours  0  Anxiety Max Last 24 Hours  0  Anxiety Min Last 24 Hours  0  Other Max Last 24 Hours  0  Psychosocial & Spiritual Assessment  Palliative Care Outcomes  Patient/Family meeting held?  Yes  Who was at the meeting?  son, daughters, pt      Patient Active Problem List   Diagnosis Date Noted  . Palliative care encounter   . Confusion   . Diarrhea of presumed infectious origin   . Acute GI bleeding 05/27/2016  . Epistaxis 04/14/2016  . Spinal stenosis 03/29/2016  . Hepatic cirrhosis (HCC) 03/10/2016  . Encephalopathy, hepatic (HCC) 02/28/2016  . Lactic acidosis 02/28/2016  . Acute CHF (congestive heart failure) (HCC) 02/28/2016  . Dementia 02/13/2016  . Gait instability   . Depression with anxiety   . Seizures (HCC)   . Abnormal EEG   . Normocytic anemia   . Acute encephalopathy 01/18/2016  . Speech disturbance 01/18/2016  . Prolonged Q-T interval on ECG 01/18/2016  . Right shoulder pain 11/07/2015  . Elevated BP 07/09/2015  . Cold sore 04/25/2015  . Chronic respiratory failure with hypoxia (HCC) 04/25/2015  . Rib pain on left side 02/21/2015  . Pedal edema 02/07/2015  . Pain in joint, ankle and foot 02/07/2015  . Diabetes mellitus, type  2 ([place]) 12/21/2014  . Chest pain, mid sternal   . General weakness 08/22/2014  . Dehydration 08/22/2014  . Acute diarrhea 08/22/2014  . Hemorrhoids 08/22/2014  . Rectal bleeding 07/04/2014  . Nonspecific (abnormal) findings on radiological and other examination of gastrointestinal tract 07/04/2014  . Iron deficiency anemia  06/27/2014  . Mild cognitive disorder 04/03/2014  . COPD (chronic obstructive pulmonary disease) with chronic bronchitis Gold B 03/14/2014  . Depression, major, recurrent ([place]) 03/06/2014  . Macular degeneration 02/18/2014  . Anxiety  state 01/08/2014  . Carotid disease, bilateral ([place]) 01/02/2014  . Burning with urination 12/17/2013  . Aortic stenosis 12/17/2013  . Wounds, multiple 06/28/2013  . Thrombocytopenia ([place]) 04/01/2013  . Cerebral artery occlusion ([place]) 03/11/2013  . Cardiac disease 03/11/2013  . Nasal septal perforation 07/20/2012  . Hyperlipidemia 10/20/2011  . Reaction to Pneumovax immunization 03/07/2011  . ACTINIC KERATOSIS 08/22/2010  . NEOPLASM OF UNCERTAIN BEHAVIOR OF SKIN 05/28/2010  . Angiodysplasia of intestine 02/25/2010  . ANEMIA-B12 DEFICIENCY 12/30/2009  . Megaloblastic anemia due to B12 deficiency 12/30/2009  . Iron deficiency anemia 12/23/2009  . BREAST PAIN 10/09/2009  . CIRRHOSIS 03/08/2009  . DIVERTICULOSIS-COLON 02/28/2009  . History of malignant neoplasm of large intestine 02/28/2009  . PERSONAL HX COLONIC POLYPS 02/28/2009  . UNSPECIFIED BREAST DISORDER 02/13/2009  . Generalized convulsive epilepsy ([place]) 10/08/2008  . NECK PAIN 09/10/2008  . COPD with chronic bronchitis ([place]) 05/29/2008  . Malaise and fatigue 03/26/2008  . GERD 03/07/2008  . Irritable bowel syndrome 12/07/2007  . TOBACCO ABUSE, Hx of 10/05/2007  . INSOMNIA, CHRONIC 10/05/2007  . ATHEROSCLEROSIS 10/05/2007  . SLEEP RELATED MOVEMENT DISORDER UNSPECIFIED 10/05/2007  . Type 2 diabetes mellitus with vascular disease ([place]) 03/02/2007  . ANXIETY 03/02/2007  . PERIPHERAL VASCULAR DISEASE 03/02/2007  . RENAL CYST, LEFT 03/02/2007  . TRANSIENT ISCHEMIC ATTACK, HX OF 03/02/2007    Palliative Care Assessment & Plan   Patient Profile: 82 y.o. female  with past medical history of Cirrhosis of the liver, COPD, diabetes, history of adenocarcinoma of the colon diagnosed in 2008, dementia, admitted on 05/26/2016 with increased confusion, abdominal discomfort, and multiple episodes of diarrhea. CT abd shows pan colitis suspicious for C. diff even though stool studies negative - treating with po vancomycin. New MRI reveals  acute left cerebellar infarct, atrophy, chronic ischemia chronic hemorrhage. Hgb has continued to trend down this hospitalization requiring blood transfusion last 05/31/16. Mental state improving slowly.   Assessment: Lying in bed, resting comfortably.   Recommendations/Plan:  Pain: Continue with tramadol as needed  Anxiety/depression: Continue with Pristiq and Klonopin  Confusion/agitation: Likely multifactorial in the setting of dementia, cirrhosis. Lactulose titrated down to home dose yesterday.   Diarrhea: Continue lactulose. Still a problem and uncontrolled.   Goals of Care and Additional Recommendations:  Limitations on Scope of Treatment: Avoid Hospitalization  Code Status:    Code Status Orders        Start     Ordered   05/27/16 0305  Do not attempt resuscitation (DNR)  Continuous    Question Answer Comment  In the event of cardiac or respiratory ARREST Do not call a "code blue"   In the event of cardiac or respiratory ARREST Do not perform Intubation, CPR, defibrillation or ACLS   In the event of cardiac or respiratory ARREST Use medication by any route, position, wound care, and other measures to relive pain and suffering. May use oxygen, suction and manual treatment of airway obstruction as needed for comfort.  05/27/16 0307    Code Status History    Date Active Date Inactive Code Status Order ID Comments User Context   02/27/2016  8:23 PM 02/27/2016  8:23 PM DNR [id number]  Norval Morton, MD Inpatient   02/27/2016  8:23 PM 03/02/2016  3:53 PM DNR [id number]  Norval Morton, MD Inpatient   01/18/2016 11:54 PM 01/23/2016  4:54 PM DNR [id number]  Vianne Bulls, MD Inpatient   08/22/2014  5:39 PM 08/24/2014  9:19 PM Full Code [id number]  Bonnielee Haff, MD Inpatient   03/14/2014  6:06 PM 03/15/2014  7:18 PM DNR [id number]  Samuella Cota, MD Inpatient    Advance Directive Documentation   Flowsheet Row Most Recent Value  Type of Advance Directive  Healthcare Power of  Attorney  Pre-existing out of facility DNR order (yellow form or pink MOST form)  No data  "MOST" Form in Place?  No data       Prognosis:   Prognosis appears to be poor with multiple acute on chronic medical issues including cirrhosis, dementia, acute on chronic anemia and new stroke - not a candidate for anticoagulation. Very likely < 6 months.   Discharge Planning:  Home with hospice.     Thank you for allowing the Palliative Medicine Team to assist in the care of this patient.   Time In: 1130 Time Out: 1200 Total Time 92mn Prolonged Time Billed  no       Greater than 50%  of this time was spent counseling and coordinating care related to the above assessment and plan.  PPershing Proud NP  Please contact Palliative Medicine Team phone at [phone] questions and concerns.

## 2016-06-02 NOTE — Progress Notes (Addendum)
PROGRESS NOTE  Monique Fleming [id number] DOB: [birth year] DOA: 05/26/2016 PCP: Penni Homans, MD  Brief summary:    Monique Fleming is a 82 y.o. female with cirrhosis of the liver, COPD, diabetes mellitus was brought to the ER after patient was found to be increasingly confused by patient's daughter. Patient last 2 days also has been having some abdominal discomfort with multiple episodes of diarrhea. In the ER patient is oriented to her name only. Ammonia levels are mildly elevated. CT of abdomen and pelvis shows pancolitis. Hemoglobin has dropped from 9-6. ER physician had discussed with on-call [place] gastroenterologist. Patient is being admitted for hepatic encephalopathy and GI bleed. In addition CT also shows coliti  stool studies negative, gi recommend treat for c diff. Acute on chronic anemia likely from gi loss, getting prbc transfusion prn, acute cva, poor prognosis.    HPI/Recap of past 24 hours:  Patient is now fully Alert for the last two days, Calm, denies hallucination , report feeling better   Assessment/Plan: Principal Problem:   Acute GI bleeding Active Problems:   Generalized convulsive epilepsy ([place])   COPD with chronic bronchitis ([place])   Angiodysplasia of intestine   Diabetes mellitus, type 2 ([place])   Acute encephalopathy   Hepatic cirrhosis (HCC)   Confusion   Diarrhea of presumed infectious origin   Palliative care encounter   1. Acute on chronic GI bleeding /Acute blood loss anemiashe was briefly on octreotide initially on presentation due to history of portal hypertension and cirrhosis - gi bleed likely from colitis, she also has h/o portal gastropathy and colonic AVM's. S/p 2units of prbc transfusion on 8/23. 1uint prbc transfusion on 8/27, [facility] consulted recommend medical management due to significant risk of stroke with invasive intervention.    Family is not happy with the answer from [facility], requested second opinion, I have contacted GI Dr Benson Norway who  is the other oncall GI group, Dr Benson Norway reviewed patient's records and states he agree with [facility]'s recommendation because patient is at increased risk of stroke or death if going through invasive intervention. He will not do formal consult. I have talked to daughter Monique Fleming over the phone related this to her.  Monique Fleming states they do not want her mother to go to SNF, she realized that although her mother has lived by herself before, she has gradually declined and it is not safe for her to stay by herself in current situation. Monique Fleming agrees that her mother will need 24/7 care. she is now more receptive about the idea of home hospice with the hope that her mother will improve in the next few weeks. Monique Fleming who is an Therapist, sports at [employer] and Monique Fleming who is a Designer, fashion/clothing at [employer] will come tonight to talk to her mother about going home with home hospice tomorrow on 8/30, Palliative team and case manager actively following.       . 2. Diffuse pan colitis with diarrhea - negative stool studies. GI recommended treat for C diff with total of 14days of vanc due to high suspicion for c diff colitis. Patient still has diarrhea, though she is also on lactulose and oral mag. Will continue titrate lactulose to achieve goal of 4bm daily. Will add sodium bicarb supplement to replace bicarb loss from diarrhea.  3. Acute encephalopathy probably secondary to hepatic encephalopathy/ and metabolic encephalopathy from colitis - on lactulose.  Xifaxan stopped per GI recommendation due to concerning for c diff.   On 8/24 and 8/25,  patient has been awake in am, then become very drowsy, not arousable to sternal rubs in pm,  CT head/abg unremarkable, EEG with metabolic changes, but no seizure spikes, ammonia better, all labs continue to improve, b12 is high /folate wnl /rpr/hiv negative, dilantin level is low, MRI + acute infarct left cerebellum She is not a candidate for plavix or statin.  fortunately patient's mental status has  since much improved, she has been fully alert and interactive on 8/28 and 8/29.  4. COPD - no wheezing, on room air, Continue inhalers.  5. Diabetes mellitus type 2 - noninsulin dependent, placed on sliding scale coverage.  6. Hypokalemia/hypomegnesemia: replace mag/k  7.   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 Not a candidate for statin due to cirrhosis, option is limited, overall prognosis is poor, I have talked to patient's daughter Monique Fleming who agrees to palliative team consult.              8.. S/p right colectomy, ileocolic anastomosis for colon cancer in 2008.               9. Abdominal distension, will get abdominal US , may need paracentesis if significant ascites, daughter Monique Fleming agrees to proceed with paracentesis for comfort if there is enough to tap.    DVT prophylaxis: SCDs. Code Status: DO NOT RESUSCITATE.  Family Communication: patient in room and daughter over the phone Disposition Plan: Home with home hospice on 8/30 Consults called: Gastroenterologist, palliative team   Procedures:  prbc transfusion on 8/23 (2units) and 8/27 (one unit)  Antibiotics:  Oral vanc from 8/24--   iv flagyl from 8/24 to 8/27   Objective: BP (!) 113/32 (BP Location: Left Arm)   Pulse 83   Temp 98.8 F (37.1 C)   Resp 20   Ht 5\' 4"  (1.626 m)   Wt 59.6 kg (131 lb 6.3 oz)   SpO2 99%   BMI 22.55 kg/m   Intake/Output Summary (Last 24 hours) at 06/02/16 1817 Last data filed at 06/02/16 1217  Gross per 24 hour  Intake             1320 ml  Output                0 ml  Net             1320 ml   Filed Weights   05/31/16 0500 06/01/16 0600 06/02/16 0500  Weight: 56.7 kg (125 lb) 59.5 kg (131 lb 2.8 oz) 59.6 kg (131 lb 6.3 oz)    Exam:   General:  Very Frail, but Fully awake , alert, pleasant, NAD  Cardiovascular: RRR with pac's  Respiratory: mild scattered  wheezing when getting up from bed to bedside commode on 8/28, diminished, no wheezing on 8/29  Abdomen: Soft/ND/NT, positive BS  Musculoskeletal: No Edema  Neuro: know she is at [place], oriented to person, know it is 2017  Data Reviewed: Basic Metabolic Panel:  Recent Labs Lab 05/27/16 1740  05/29/16 0422 05/30/16 0643 05/31/16 0544 06/01/16 0607 06/02/16 0639  NA 142  < > 143 144 140 139 137  K 4.6  < > 3.3* 3.4* 3.6 4.2 4.1  CL 115*  < > 115* 116* 116* 113* 113*  CO2 21*  < > 21* 21* 21* 20* 19*  GLUCOSE 218*  < > 144* 133* 148* 129* 202*  BUN 80*  < >  51* 26* 24* 19 15  CREATININE 1.34*  < > 1.05* 0.84 0.90 0.85 0.77  CALCIUM 8.3*  < > 7.8* 7.6* 7.3* 7.6* 7.4*  MG 1.9  --  1.7 1.5* 2.0  --  1.6*  < > = values in this interval not displayed. Liver Function Tests:  Recent Labs Lab 05/26/16 2200 05/30/16 0643 05/31/16 0544  AST 45* 42* 37  ALT 20 19 19   ALKPHOS 75 59 63  BILITOT 0.9 0.7 0.7  PROT 5.2* 4.8* 4.8*  ALBUMIN 2.4* 2.1* 2.1*   No results for input(s): LIPASE, AMYLASE in the last 168 hours.  Recent Labs Lab 05/29/16 0422 05/30/16 0643 05/31/16 0544 06/01/16 0607 06/02/16 0638  AMMONIA 32 41* 40* 35 31   CBC:  Recent Labs Lab 05/26/16 2200  05/29/16 0422 05/30/16 0643 05/31/16 0544 06/01/16 0607 06/02/16 0639  WBC 25.2*  < > 14.3* 10.3 12.2* 11.2* 8.5  NEUTROABS 19.4*  --   --   --   --   --   --   HGB 6.8*  < > 8.2* 7.5* 7.4* 8.4* 8.1*  HCT 22.3*  < > 26.6* 24.5* 24.3* 26.9* 26.6*  MCV 94.9  < > 94.0 93.9 93.8 92.8 93.0  PLT 255  < > 147* 130* 129* 125* 125*  < > = values in this interval not displayed. Cardiac Enzymes:   No results for input(s): CKTOTAL, CKMB, CKMBINDEX, TROPONINI in the last 168 hours. BNP (last 3 results)  Recent Labs  02/27/16 1520  BNP 535.4*    ProBNP (last 3 results) No results for input(s): PROBNP in the last 8760 hours.  CBG:  Recent Labs Lab 06/02/16 0013 06/02/16 0301 06/02/16 0759  06/02/16 1222 06/02/16 1720  GLUCAP 178* 125* 158* 223* 205*    Recent Results (from the past 240 hour(s))  C difficile quick scan w PCR reflex     Status: None   Collection Time: 05/26/16 10:40 PM  Result Value Ref Range Status   C Diff antigen NEGATIVE NEGATIVE Final   C Diff toxin NEGATIVE NEGATIVE Final   C Diff interpretation No C. difficile detected.  Final  Urine culture     Status: None   Collection Time: 05/26/16 10:45 PM  Result Value Ref Range Status   Specimen Description URINE, RANDOM  Final   Special Requests NONE  Final   Culture NO GROWTH  Final   Report Status 05/28/2016 FINAL  Final  Blood culture (routine x 2)     Status: None (Preliminary result)   Collection Time: 05/26/16 11:30 PM  Result Value Ref Range Status   Specimen Description BLOOD LEFT HAND  Final   Special Requests IN PEDIATRIC BOTTLE 3CC  Final   Culture NO GROWTH 4 DAYS  Final   Report Status PENDING  Incomplete  Blood culture (routine x 2)     Status: None (Preliminary result)   Collection Time: 05/26/16 11:35 PM  Result Value Ref Range Status   Specimen Description BLOOD RIGHT HAND  Final   Special Requests BOTTLES DRAWN AEROBIC AND ANAEROBIC 5CC  Final   Culture NO GROWTH 4 DAYS  Final   Report Status PENDING  Incomplete  Gastrointestinal Panel by PCR , Stool     Status: None   Collection Time: 05/27/16  3:07 AM  Result Value Ref Range Status   Campylobacter species NOT DETECTED NOT DETECTED Final   Plesimonas shigelloides NOT DETECTED NOT DETECTED Final   Salmonella species NOT DETECTED NOT DETECTED Final  Yersinia enterocolitica NOT DETECTED NOT DETECTED Final   Vibrio species NOT DETECTED NOT DETECTED Final   Vibrio cholerae NOT DETECTED NOT DETECTED Final   Enteroaggregative E coli (EAEC) NOT DETECTED NOT DETECTED Final   Enteropathogenic E coli (EPEC) NOT DETECTED NOT DETECTED Final   Enterotoxigenic E coli (ETEC) NOT DETECTED NOT DETECTED Final   Shiga like toxin producing E  coli (STEC) NOT DETECTED NOT DETECTED Final   E. coli O157 NOT DETECTED NOT DETECTED Final   Shigella/Enteroinvasive E coli (EIEC) NOT DETECTED NOT DETECTED Final   Cryptosporidium NOT DETECTED NOT DETECTED Final   Cyclospora cayetanensis NOT DETECTED NOT DETECTED Final   Entamoeba histolytica NOT DETECTED NOT DETECTED Final   Giardia lamblia NOT DETECTED NOT DETECTED Final   Adenovirus F40/41 NOT DETECTED NOT DETECTED Final   Astrovirus NOT DETECTED NOT DETECTED Final   Norovirus GI/GII NOT DETECTED NOT DETECTED Final   Rotavirus A NOT DETECTED NOT DETECTED Final   Sapovirus (I, II, IV, and V) NOT DETECTED NOT DETECTED Final     Studies: No results found.  Scheduled Meds: . arformoterol  15 mcg Nebulization BID  . budesonide  0.25 mg Nebulization BID  . clonazePAM  1 mg Oral QHS  . desvenlafaxine  100 mg Oral Daily  . feeding supplement (ENSURE ENLIVE)  237 mL Oral BID BM  . fluticasone  2 spray Each Nare Daily  . insulin aspart  0-9 Units Subcutaneous TID AC & HS  . lactulose  20 g Oral BID  . montelukast  10 mg Oral QHS  . pantoprazole  40 mg Intravenous Q12H  . phenytoin  200 mg Oral QHS  . sodium bicarbonate  650 mg Oral Daily  . vancomycin  250 mg Oral Q6H    Continuous Infusions:     Time spent: Darlen Round MD, PhD  [facility] Pager [phone]. If 7PM-7AM, please contact night-coverage at [email or web address], password [facility] 06/02/2016, 6:17 PM  LOS: 6 days

## 2016-06-02 NOTE — Progress Notes (Addendum)
Dr. Erlinda Hong paged to clarify lactulose orders. Patient has have 4 bowel movements since 0700 that have been watery. MD addressed orders.

## 2016-06-02 NOTE — Care Management Note (Signed)
Case Management Note  Patient Details  Name: Monique Fleming MRN: [id number] Date of Birth: Apr 01, 1929  Subjective/Objective:            Admitted with  Acute GI bleed.      Action/Plan: CM received consult: Anticipate discharge home on 8/30, need to maximize home health if family does not want hospice. CM spoke with daughter Sherlon Handing regarding d/c plan. Sherlon Handing stated she and her sister are to discuss d/c plans with mom this evening. CM offered choice list for home hospice and [facility] requested referrals to be made with [facility]([place] [phone]) and [facility] [place] [phone]). CM called and made referral to both agencies.  Expected Discharge Date:   06/03/2016            Expected Discharge Plan:  [place] vs home with home hospice  In-House Referral:  Clinical Social Work  Discharge planning Services  CM Consult  Post Acute Care Choice:    Choice offered to:   daughter([place])   DME Arranged:    DME Agency:     HH Arranged:    [place] Agency:   [place]  Status of Service:  In process, will continue to follow  If discussed at Long Length of Stay Meetings, dates discussed:    Additional Comments:  Sharin Mons, RN 06/02/2016, 2:01 PM

## 2016-06-03 ENCOUNTER — Inpatient Hospital Stay (HOSPITAL_COMMUNITY): Payer: Medicare Other

## 2016-06-03 ENCOUNTER — Telehealth: Payer: Self-pay | Admitting: Family Medicine

## 2016-06-03 LAB — CBC
HEMATOCRIT: 26.1 % — AB (ref 36.0–46.0)
Hemoglobin: 8.1 g/dL — ABNORMAL LOW (ref 12.0–15.0)
MCH: 28.4 pg (ref 26.0–34.0)
MCHC: 31 g/dL (ref 30.0–36.0)
MCV: 91.6 fL (ref 78.0–100.0)
Platelets: 136 10*3/uL — ABNORMAL LOW (ref 150–400)
RBC: 2.85 MIL/uL — ABNORMAL LOW (ref 3.87–5.11)
RDW: 16.2 % — AB (ref 11.5–15.5)
WBC: 7.2 10*3/uL (ref 4.0–10.5)

## 2016-06-03 LAB — GLUCOSE, CAPILLARY
GLUCOSE-CAPILLARY: 226 mg/dL — AB (ref 65–99)
Glucose-Capillary: 181 mg/dL — ABNORMAL HIGH (ref 65–99)
Glucose-Capillary: 195 mg/dL — ABNORMAL HIGH (ref 65–99)
Glucose-Capillary: 211 mg/dL — ABNORMAL HIGH (ref 65–99)

## 2016-06-03 LAB — BASIC METABOLIC PANEL
Anion gap: 6 (ref 5–15)
BUN: 10 mg/dL (ref 6–20)
CALCIUM: 7.4 mg/dL — AB (ref 8.9–10.3)
CO2: 22 mmol/L (ref 22–32)
CREATININE: 0.81 mg/dL (ref 0.44–1.00)
Chloride: 110 mmol/L (ref 101–111)
GFR calc non Af Amer: >60 mL/min (ref >60)
Glucose, Bld: 214 mg/dL — ABNORMAL HIGH (ref 65–99)
Potassium: 3.7 mmol/L (ref 3.5–5.1)
Sodium: 138 mmol/L (ref 135–145)

## 2016-06-03 LAB — CULTURE, BLOOD (ROUTINE X 2)
CULTURE: NO GROWTH
Culture: NO GROWTH

## 2016-06-03 LAB — AMMONIA: AMMONIA: 42 umol/L — AB (ref 9–35)

## 2016-06-03 LAB — MAGNESIUM: Magnesium: 1.8 mg/dL (ref 1.7–2.4)

## 2016-06-03 MED ORDER — FAMOTIDINE 20 MG PO TABS
10.0000 mg | ORAL_TABLET | Freq: Every day | ORAL | Status: DC
  Administered 2016-06-04: 10 mg via ORAL
  Filled 2016-06-03: qty 1

## 2016-06-03 MED ORDER — LACTULOSE 10 GM/15ML PO SOLN: 20.0000 g | Freq: Every day | ORAL | Status: DC | PRN

## 2016-06-03 MED ORDER — LACTULOSE 10 GM/15ML PO SOLN
20.0000 g | Freq: Every day | ORAL | Status: DC
  Administered 2016-06-04: 20 g via ORAL
  Filled 2016-06-03: qty 30

## 2016-06-03 MED ORDER — LIDOCAINE HCL 1 % IJ SOLN
INTRAMUSCULAR | Status: AC
  Filled 2016-06-03: qty 20

## 2016-06-03 NOTE — Procedures (Signed)
Successful US guided paracentesis from RLQ.  Yielded 1.5L of hazy, pale yellow fluid.  No immediate complications.  Pt tolerated well.   Specimen was not sent for labs.  Ascencion Dike PA-C 06/03/2016 9:49 AM

## 2016-06-03 NOTE — Telephone Encounter (Signed)
I am willing to be physician of record and supply Hospice with verbal orders needed.

## 2016-06-03 NOTE — Progress Notes (Signed)
Daily Progress Note   Patient Name: Monique Fleming       Date: 06/03/2016 DOB: 04-Jun-1929  Age: 82 y.o. MRN#: [id number] Attending Physician: Elmarie Shiley, MD Primary Care Physician: Penni Homans, MD Admit Date: 05/26/2016  Reason for Consultation/Follow-up: Establishing goals of care  Subjective: Ms. Lay returning from U/S and paracentesis 1.5L and reports feeling much better. I was discussing with daughters [name] and [name] at bedside along with Beverlee Nims from hospice. Family still frustrated with plan and "we feel like she is being kicked out of the hospital and if we bring her back to emergency room tomorrow she would be admitted again." Would like to discuss in person a second opinion with GI. Offered emotional support and empathy in a difficult situation.   Long discussion with Dr. Tyrell Antonio who will reach out to [facility] for second opinion and try and best address family concerns. Unfortunately still having ~10 BM a day - I agree this will not be very manageable at home. Appreciate her care.   Length of Stay: 7  Current Medications: Scheduled Meds:  . arformoterol  15 mcg Nebulization BID  . budesonide  0.25 mg Nebulization BID  . clonazePAM  1 mg Oral QHS  . desvenlafaxine  100 mg Oral Daily  . [START ON 06/04/2016] famotidine  10 mg Oral Daily  . feeding supplement (ENSURE ENLIVE)  237 mL Oral BID BM  . fluticasone  2 spray Each Nare Daily  . insulin aspart  0-9 Units Subcutaneous TID AC & HS  . [START ON 06/04/2016] lactulose  20 g Oral Daily  . lidocaine      . montelukast  10 mg Oral QHS  . phenytoin  200 mg Oral QHS  . vancomycin  250 mg Oral Q6H    Continuous Infusions:    PRN Meds: albuterol, clonazePAM, ipratropium-albuterol, lactulose, ondansetron **OR**  ondansetron (ZOFRAN) IV, traMADol  Physical Exam  Constitutional: She appears well-developed.  HENT:  Head: Normocephalic and atraumatic.  Cardiovascular: Normal rate.   Pulmonary/Chest: Effort normal. No accessory muscle usage. No tachypnea. No respiratory distress.  Abdominal: Soft. Normal appearance. She exhibits distension. There is no tenderness.  Neurological: She is alert. She is disoriented.            Vital Signs: BP (!) 115/42 (BP Location: Right Arm)  Pulse 86   Temp 98.6 F (37 C) (Oral)   Resp 20   Ht 5\' 4"  (1.626 m)   Wt 60.7 kg (133 lb 13.1 oz)   SpO2 100%   BMI 22.97 kg/m  SpO2: SpO2: 100 % O2 Device: O2 Device: Not Delivered O2 Flow Rate: O2 Flow Rate (L/min): 2 L/min  Intake/output summary:   Intake/Output Summary (Last 24 hours) at 06/03/16 2021 Last data filed at 06/03/16 1853  Gross per 24 hour  Intake             1760 ml  Output                0 ml  Net             1760 ml   LBM: Last BM Date: 06/02/16 Baseline Weight: Weight: 56.2 kg (123 lb 12.8 oz) Most recent weight: Weight: 60.7 kg (133 lb 13.1 oz)       Palliative Assessment/Data:    Flowsheet Rows   Flowsheet Row Most Recent Value  Intake Tab  Referral Department  Hospitalist  Unit at Time of Referral  Med/Surg Unit  Palliative Care Primary Diagnosis  Neurology  Date Notified  05/30/16  Palliative Care Type  New Palliative care  Reason for referral  Clarify Goals of Care  Date of Admission  05/26/16  Date first seen by Palliative Care  05/31/16  # of days Palliative referral response time  1 Day(s)  # of days IP prior to Palliative referral  4  Clinical Assessment  Palliative Performance Scale Score  40%  Pain Max last 24 hours  0  Pain Min Last 24 hours  0  Dyspnea Max Last 24 Hours  0  Dyspnea Min Last 24 hours  0  Nausea Max Last 24 Hours  0  Nausea Min Last 24 Hours  0  Anxiety Max Last 24 Hours  0  Anxiety Min Last 24 Hours  0  Other Max Last 24 Hours  0    Psychosocial & Spiritual Assessment  Palliative Care Outcomes  Patient/Family meeting held?  Yes  Who was at the meeting?  son, daughters, pt      Patient Active Problem List   Diagnosis Date Noted  . Abdominal distension   . Palliative care encounter   . Confusion   . Diarrhea of presumed infectious origin   . Acute GI bleeding 05/27/2016  . Epistaxis 04/14/2016  . Spinal stenosis 03/29/2016  . Hepatic cirrhosis ([place]) 03/10/2016  . Encephalopathy, hepatic ([place]) 02/28/2016  . Lactic acidosis 02/28/2016  . Acute CHF (congestive heart failure) ([place]) 02/28/2016  . Dementia 02/13/2016  . Gait instability   . Depression with anxiety   . Seizures ([place])   . Abnormal EEG   . Normocytic anemia   . Acute encephalopathy 01/18/2016  . Speech disturbance 01/18/2016  . Prolonged Q-T interval on ECG 01/18/2016  . Right shoulder pain 11/07/2015  . Elevated BP 07/09/2015  . Cold sore 04/25/2015  . Chronic respiratory failure with hypoxia ([place]) 04/25/2015  . Rib pain on left side 02/21/2015  . Pedal edema 02/07/2015  . Pain in joint, ankle and foot 02/07/2015  . Diabetes mellitus, type 2 ([place]) 12/21/2014  . Chest pain, mid sternal   . General weakness 08/22/2014  . Dehydration 08/22/2014  . Acute diarrhea 08/22/2014  . Hemorrhoids 08/22/2014  . Rectal bleeding 07/04/2014  . Nonspecific (abnormal) findings on radiological and other examination of gastrointestinal tract 07/04/2014  . Iron  deficiency anemia  06/27/2014  . Mild cognitive disorder 04/03/2014  . COPD (chronic obstructive pulmonary disease) with chronic bronchitis Gold B 03/14/2014  . Depression, major, recurrent ([place]) 03/06/2014  . Macular degeneration 02/18/2014  . Anxiety state 01/08/2014  . Carotid disease, bilateral ([place]) 01/02/2014  . Burning with urination 12/17/2013  . Aortic stenosis 12/17/2013  . Wounds, multiple 06/28/2013  . Thrombocytopenia ([place]) 04/01/2013  . Cerebral artery occlusion ([place]) 03/11/2013   . Cardiac disease 03/11/2013  . Nasal septal perforation 07/20/2012  . Hyperlipidemia 10/20/2011  . Reaction to Pneumovax immunization 03/07/2011  . ACTINIC KERATOSIS 08/22/2010  . NEOPLASM OF UNCERTAIN BEHAVIOR OF SKIN 05/28/2010  . Angiodysplasia of intestine 02/25/2010  . ANEMIA-B12 DEFICIENCY 12/30/2009  . Megaloblastic anemia due to B12 deficiency 12/30/2009  . Iron deficiency anemia 12/23/2009  . BREAST PAIN 10/09/2009  . CIRRHOSIS 03/08/2009  . DIVERTICULOSIS-COLON 02/28/2009  . Diarrhea 02/28/2009  . History of malignant neoplasm of large intestine 02/28/2009  . PERSONAL HX COLONIC POLYPS 02/28/2009  . UNSPECIFIED BREAST DISORDER 02/13/2009  . Generalized convulsive epilepsy ([place]) 10/08/2008  . NECK PAIN 09/10/2008  . COPD with chronic bronchitis ([place]) 05/29/2008  . Malaise and fatigue 03/26/2008  . GERD 03/07/2008  . Irritable bowel syndrome 12/07/2007  . TOBACCO ABUSE, Hx of 10/05/2007  . INSOMNIA, CHRONIC 10/05/2007  . ATHEROSCLEROSIS 10/05/2007  . SLEEP RELATED MOVEMENT DISORDER UNSPECIFIED 10/05/2007  . Type 2 diabetes mellitus with vascular disease ([place]) 03/02/2007  . ANXIETY 03/02/2007  . PERIPHERAL VASCULAR DISEASE 03/02/2007  . RENAL CYST, LEFT 03/02/2007  . TRANSIENT ISCHEMIC ATTACK, HX OF 03/02/2007    Palliative Care Assessment & Plan   Patient Profile: 82 y.o. female  with past medical history of Cirrhosis of the liver, COPD, diabetes, history of adenocarcinoma of the colon diagnosed in 2008, dementia, admitted on 05/26/2016 with increased confusion, abdominal discomfort, and multiple episodes of diarrhea. CT abd shows pan colitis suspicious for C. diff even though stool studies negative - treating with po vancomycin. New MRI reveals acute left cerebellar infarct, atrophy, chronic ischemia chronic hemorrhage. Hgb has continued to trend down this hospitalization requiring blood transfusion last 05/31/16. Mental state improving slowly.   Assessment: Lying in  bed, resting comfortably.   Recommendations/Plan:  Pain: Continue with tramadol as needed  Anxiety/depression: Continue with Pristiq and Klonopin  Confusion/agitation: Likely multifactorial in the setting of dementia, cirrhosis. Lactulose titrated down to home dose yesterday.   Diarrhea: Continue lactulose. Still a problem and uncontrolled.   Goals of Care and Additional Recommendations:  Limitations on Scope of Treatment: Avoid Hospitalization  Code Status:    Code Status Orders        Start     Ordered   05/27/16 0305  Do not attempt resuscitation (DNR)  Continuous    Question Answer Comment  In the event of cardiac or respiratory ARREST Do not call a "code blue"   In the event of cardiac or respiratory ARREST Do not perform Intubation, CPR, defibrillation or ACLS   In the event of cardiac or respiratory ARREST Use medication by any route, position, wound care, and other measures to relive pain and suffering. May use oxygen, suction and manual treatment of airway obstruction as needed for comfort.      05/27/16 [id number]    Code Status History    Date Active Date Inactive Code Status Order ID Comments User Context   02/27/2016  8:23 PM 02/27/2016  8:23 PM DNR [id number]  Norval Morton, MD Inpatient  02/27/2016  8:23 PM 03/02/2016  3:53 PM DNR [id number]  Norval Morton, MD Inpatient   01/18/2016 11:54 PM 01/23/2016  4:54 PM DNR [id number]  Vianne Bulls, MD Inpatient   08/22/2014  5:39 PM 08/24/2014  9:19 PM Full Code [id number]  Bonnielee Haff, MD Inpatient   03/14/2014  6:06 PM 03/15/2014  7:18 PM DNR [id number]  Samuella Cota, MD Inpatient    Advance Directive Documentation   Flowsheet Row Most Recent Value  Type of Advance Directive  Healthcare Power of Attorney  Pre-existing out of facility DNR order (yellow form or pink MOST form)  No data  "MOST" Form in Place?  No data       Prognosis:   Prognosis appears to be poor with multiple acute on chronic medical issues  including cirrhosis, dementia, acute on chronic anemia and new stroke - not a candidate for anticoagulation. Very likely < 6 months.   Discharge Planning:  Home with hospice.     Thank you for allowing the Palliative Medicine Team to assist in the care of this patient.   Time In: 1000 Time Out: 1100 Total Time 81min Prolonged Time Billed  no       Greater than 50%  of this time was spent counseling and coordinating care related to the above assessment and plan.  Pershing Proud, NP  Please contact Palliative Medicine Team phone at [phone] for questions and concerns.

## 2016-06-03 NOTE — Progress Notes (Signed)
Family is requesting discharge to be after 1:00pm tomorrow due to Hospice needing time to set up equipment.

## 2016-06-03 NOTE — Progress Notes (Signed)
PROGRESS NOTE  Monique Fleming [id number] DOB: Jun 15, 1929 DOA: 05/26/2016 PCP: Penni Homans, MD  Brief summary:    Monique Fleming is a 82 y.o. female with cirrhosis of the liver, COPD, diabetes mellitus was brought to the ER after Monique Fleming was found to be increasingly confused by Monique Fleming's daughter. Monique Fleming last 2 days also has been having some abdominal discomfort with multiple episodes of diarrhea. In the ER Monique Fleming is oriented to her name only. Ammonia levels are mildly elevated. CT of abdomen and pelvis shows pancolitis. Hemoglobin has dropped from 9-6. ER physician had discussed with on-call [facility] gastroenterologist. Monique Fleming is being admitted for hepatic encephalopathy and GI bleed. In addition CT also shows coliti  stool studies negative, gi recommend treat for c diff. Acute on chronic anemia likely from gi loss, getting prbc transfusion prn, acute cva, poor prognosis.    HPI/Recap of past 24 hours:  Monique Fleming is now fully Alert for the last two days, Calm, denies hallucination , report feeling better   Assessment/Plan: Principal Problem:   Acute GI bleeding Active Problems:   Generalized convulsive epilepsy ([place])   COPD with chronic bronchitis ([place])   Angiodysplasia of intestine   Diabetes mellitus, type 2 ([place])   Acute encephalopathy   Hepatic cirrhosis ([facility])   Confusion   Diarrhea of presumed infectious origin   Palliative care encounter   Abdominal distension   1. Acute on chronic GI bleeding /Acute blood loss anemiashe was briefly on octreotide initially on presentation due to history of portal hypertension and cirrhosis - gi bleed likely from colitis, Monique Fleming also has h/o portal gastropathy and colonic AVM's. S/p 2units of prbc transfusion on 8/23. 1uint prbc transfusion on 8/27, [facility] consulted recommend medical management due to significant risk of stroke with invasive intervention.          . Diffuse pan colitis with diarrhea - negative stool studies. GI  recommended treat for C diff with total of 14days of vanc due to high suspicion for c diff colitis.  -will change lactulose to daily. Will order one PRN dose if Monique Fleming doesn't have 4 BM in 24 hours.  -Continue with vancomycin , Will ask GI :  Should we increase dose ?Marland Kitchen  -GI consulted, discussed with Dr Paulita Fujita he will see Monique Fleming later today.  -Should we start Sequestrant ?   2. Acute encephalopathy probably secondary to hepatic encephalopathy/ and metabolic encephalopathy from colitis - on lactulose.  Xifaxan stopped per GI recommendation due to concerning for c diff.   On 8/24 and 8/25, Monique Fleming has been awake in am, then become very drowsy, not arousable to sternal rubs in pm,  CT head/abg unremarkable, EEG with metabolic changes, but no seizure spikes, ammonia better, all labs continue to improve, b12 is high /folate wnl /rpr/hiv negative, dilantin level is low, MRI + acute infarct left cerebellum Monique Fleming is not a candidate for plavix or statin.  fortunately Monique Fleming's mental status has since much improved, Monique Fleming has been fully alert and interactive on 8/28 and 8/29.-8-30  3. COPD - no wheezing, on room air, Continue inhalers.  4. Diabetes mellitus type 2 - noninsulin dependent, placed on sliding scale coverage.  5. Hypokalemia/hypomegnesemia: replace mag/k  7.   Acute infarct left cerebellum with chronic hemorrhage in the brain:  not a candidate for antiplatelet due to gi bleed and chronic hemorrhage in the brain. Not a candidate for statin due to cirrhosis, option is limited, overall prognosis is poor.  Explain to daughter why Monique Fleming has not been on plavix, due to concern for  GI bleed. Dr Paulita Fujita to reevaluate.              8.. S/p right colectomy, ileocolic anastomosis for colon cancer in 2008.    9. Ascites; Abdominal distension, S/P paracentesis for symptoms management.    DVT prophylaxis: SCDs. Code Status: DO NOT RESUSCITATE.  Family Communication: Monique Fleming in room and  daughter Disposition Plan: Home , in 24 to 48 hours.  Consults called: Gastroenterologist, palliative team   Procedures:  prbc transfusion on 8/23 (2units) and 8/27 (one unit)  Antibiotics:  Oral vanc from 8/24--   iv flagyl from 8/24 to 8/27   Objective: BP (!) 126/52 (BP Location: Left Arm)   Pulse 95   Temp 98.1 F (36.7 C) (Oral)   Resp 18   Ht 5\' 4"  (1.626 m)   Wt 60.7 kg (133 lb 13.1 oz)   SpO2 95%   BMI 22.97 kg/m   Intake/Output Summary (Last 24 hours) at 06/03/16 1231 Last data filed at 06/03/16 0510  Gross per 24 hour  Intake             1132 ml  Output              100 ml  Net             1032 ml   Filed Weights   06/01/16 0600 06/02/16 0500 06/03/16 0500  Weight: 59.5 kg (131 lb 2.8 oz) 59.6 kg (131 lb 6.3 oz) 60.7 kg (133 lb 13.1 oz)    Exam:   General:   Fully awake , alert, pleasant, NAD  Cardiovascular: RRR with pac's  Respiratory:   Abdomen: Soft/ND/NT, positive BS  Musculoskeletal: No Edema  Neuro: alert, conversant, talking about her medications.   Data Reviewed: Basic Metabolic Panel:  Recent Labs Lab 05/29/16 0422 05/30/16 [id number] 05/31/16 0544 06/01/16 0607 06/02/16 0639 06/03/16 0500  NA 143 144 140 139 137 138  K 3.3* 3.4* 3.6 4.2 4.1 3.7  CL 115* 116* 116* 113* 113* 110  CO2 21* 21* 21* 20* 19* 22  GLUCOSE 144* 133* 148* 129* 202* 214*  BUN 51* 26* 24* 19 15 10   CREATININE 1.05* 0.84 0.90 0.85 0.77 0.81  CALCIUM 7.8* 7.6* 7.3* 7.6* 7.4* 7.4*  MG 1.7 1.5* 2.0  --  1.6* 1.8   Liver Function Tests:  Recent Labs Lab 05/30/16 0643 05/31/16 0544  AST 42* 37  ALT 19 19  ALKPHOS 59 63  BILITOT 0.7 0.7  PROT 4.8* 4.8*  ALBUMIN 2.1* 2.1*   No results for input(s): LIPASE, AMYLASE in the last 168 hours.  Recent Labs Lab 05/30/16 [phone] 05/31/16 0544 06/01/16 0607 06/02/16 0638 06/03/16 0504  AMMONIA 41* 40* 35 31 42*   CBC:  Recent Labs Lab 05/30/16 0643 05/31/16 0544 06/01/16 0607 06/02/16 0639  06/03/16 0500  WBC 10.3 12.2* 11.2* 8.5 7.2  HGB 7.5* 7.4* 8.4* 8.1* 8.1*  HCT 24.5* 24.3* 26.9* 26.6* 26.1*  MCV 93.9 93.8 92.8 93.0 91.6  PLT 130* 129* 125* 125* 136*   Cardiac Enzymes:   No results for input(s): CKTOTAL, CKMB, CKMBINDEX, TROPONINI in the last 168 hours. BNP (last 3 results)  Recent Labs  02/27/16 1520  BNP 535.4*    ProBNP (last 3 results) No results for input(s): PROBNP in the last 8760 hours.  CBG:  Recent Labs Lab 06/02/16 1222 06/02/16 1720 06/02/16 2113 06/03/16 0816 06/03/16 1210  GLUCAP 223* 205* 204* 181* 195*    Recent Results (from the past 240 hour(s))  C difficile quick scan w PCR reflex     Status: None   Collection Time: 05/26/16 10:40 PM  Result Value Ref Range Status   C Diff antigen NEGATIVE NEGATIVE Final   C Diff toxin NEGATIVE NEGATIVE Final   C Diff interpretation No C. difficile detected.  Final  Urine culture     Status: None   Collection Time: 05/26/16 10:45 PM  Result Value Ref Range Status   Specimen Description URINE, RANDOM  Final   Special Requests NONE  Final   Culture NO GROWTH  Final   Report Status 05/28/2016 FINAL  Final  Blood culture (routine x 2)     Status: None (Preliminary result)   Collection Time: 05/26/16 11:30 PM  Result Value Ref Range Status   Specimen Description BLOOD LEFT HAND  Final   Special Requests IN PEDIATRIC BOTTLE 3CC  Final   Culture NO GROWTH 4 DAYS  Final   Report Status PENDING  Incomplete  Blood culture (routine x 2)     Status: None (Preliminary result)   Collection Time: 05/26/16 11:35 PM  Result Value Ref Range Status   Specimen Description BLOOD RIGHT HAND  Final   Special Requests BOTTLES DRAWN AEROBIC AND ANAEROBIC 5CC  Final   Culture NO GROWTH 4 DAYS  Final   Report Status PENDING  Incomplete  Gastrointestinal Panel by PCR , Stool     Status: None   Collection Time: 05/27/16  3:07 AM  Result Value Ref Range Status   Campylobacter species NOT DETECTED NOT DETECTED  Final   Plesimonas shigelloides NOT DETECTED NOT DETECTED Final   Salmonella species NOT DETECTED NOT DETECTED Final   Yersinia enterocolitica NOT DETECTED NOT DETECTED Final   Vibrio species NOT DETECTED NOT DETECTED Final   Vibrio cholerae NOT DETECTED NOT DETECTED Final   Enteroaggregative E coli (EAEC) NOT DETECTED NOT DETECTED Final   Enteropathogenic E coli (EPEC) NOT DETECTED NOT DETECTED Final   Enterotoxigenic E coli (ETEC) NOT DETECTED NOT DETECTED Final   Shiga like toxin producing E coli (STEC) NOT DETECTED NOT DETECTED Final   E. coli O157 NOT DETECTED NOT DETECTED Final   Shigella/Enteroinvasive E coli (EIEC) NOT DETECTED NOT DETECTED Final   Cryptosporidium NOT DETECTED NOT DETECTED Final   Cyclospora cayetanensis NOT DETECTED NOT DETECTED Final   Entamoeba histolytica NOT DETECTED NOT DETECTED Final   Giardia lamblia NOT DETECTED NOT DETECTED Final   Adenovirus F40/41 NOT DETECTED NOT DETECTED Final   Astrovirus NOT DETECTED NOT DETECTED Final   Norovirus GI/GII NOT DETECTED NOT DETECTED Final   Rotavirus A NOT DETECTED NOT DETECTED Final   Sapovirus (I, II, IV, and V) NOT DETECTED NOT DETECTED Final     Studies: US Paracentesis  Result Date: 06/03/2016 INDICATION: Cirrhosis. Abdominal distention secondary to ascites. Request therapeutic paracentesis. EXAM: ULTRASOUND GUIDED RIGHT LOWER QUADRANT PARACENTESIS MEDICATIONS: None. COMPLICATIONS: None immediate. PROCEDURE: Informed written consent was obtained from the Monique Fleming after a discussion of the risks, benefits and alternatives to treatment. A timeout was performed prior to the initiation of the procedure. Initial ultrasound scanning demonstrates a large amount of ascites within the right lower abdominal quadrant. The right lower abdomen was prepped and draped in the usual sterile fashion. 1% lidocaine with epinephrine was used for local anesthesia. Following this, a Safe-T-Centesis catheter was introduced. An ultrasound  image was saved for documentation purposes. The paracentesis  was performed. The catheter was removed and a dressing was applied. The Monique Fleming tolerated the procedure well without immediate post procedural complication. FINDINGS: A total of approximately 1.5 L of hazy, pale yellow fluid was removed. IMPRESSION: Successful ultrasound-guided paracentesis yielding 1.5 liters of peritoneal fluid. Read by: Ascencion Dike PA-C Electronically Signed   By: Sandi Mariscal M.D.   On: 06/03/2016 09:49    Scheduled Meds: . arformoterol  15 mcg Nebulization BID  . budesonide  0.25 mg Nebulization BID  . clonazePAM  1 mg Oral QHS  . desvenlafaxine  100 mg Oral Daily  . feeding supplement (ENSURE ENLIVE)  237 mL Oral BID BM  . fluticasone  2 spray Each Nare Daily  . insulin aspart  0-9 Units Subcutaneous TID AC & HS  . lactulose  20 g Oral BID  . lidocaine      . montelukast  10 mg Oral QHS  . phenytoin  200 mg Oral QHS  . vancomycin  250 mg Oral Q6H    Continuous Infusions:     Time spent: 35 minutes   Niel Hummer A MD [facility] Pager [phone]. If 7PM-7AM, please contact night-coverage at [email or web address], password [facility] 06/03/2016, 12:31 PM  LOS: 7 days

## 2016-06-03 NOTE — Progress Notes (Signed)
Physical Therapy Treatment Patient Details Name: Monique Fleming MRN: [id number] DOB: 08-Nov-1928 Today's Date: 06/03/2016    History of Present Illness Pt is a 82 y/o F who was brought to the ER after pt found to be increasingly confused per pt's daughter.  Pt had been experiencing some abdominal discomfort with multiple episodes of diarrhea.  Pt admitted for hepatic encephalopathy and GI bleed.  EEG on 8/25 abnormal with general slowing.  Pt's PMH includes adenocarcinoma, anxiety, cellulitis, macular degeneration, seizures, spinal stenosis, TIA.    PT Comments    Patient is making progress toward mobility goals. Fatigued end of session. Pt continues to need supervision/assist with OOB mobility. Continue to progress as tolerate.   Follow Up Recommendations  SNF;Supervision/Assistance - 24 hour     Equipment Recommendations  Rolling walker with 5" wheels    Recommendations for Other Services OT consult     Precautions / Restrictions Precautions Precautions: Fall Restrictions Weight Bearing Restrictions: No    Mobility  Bed Mobility Overal bed mobility: Needs Assistance Bed Mobility: Rolling;Sidelying to Sit;Sit to Sidelying         Sit to sidelying: Min assist General bed mobility comments: pt sitting EOB with daughter present upon arrival; assist to elevate bilat LE into bed  Transfers Overall transfer level: Needs assistance Equipment used: Rolling walker (2 wheeled) Transfers: Sit to/from Stand Sit to Stand: Min assist         General transfer comment: from EOB and commodeassist to power up into standing; cues for safe hand placement  Ambulation/Gait Ambulation/Gait assistance: Min guard Ambulation Distance (Feet): 75 Feet Assistive device: Rolling walker (2 wheeled) Gait Pattern/deviations: Step-through pattern;Decreased stride length;Trunk flexed Gait velocity: decreased   General Gait Details: cues for posture and proximity of RW; standing rest break  required with cues for breathing technique; SpO2 93% or > on RA with mobility   Stairs            Wheelchair Mobility    Modified Rankin (Stroke Patients Only)       Balance     Sitting balance-Leahy Scale: Fair       Standing balance-Leahy Scale: Poor                      Cognition Arousal/Alertness: Awake/alert Behavior During Therapy: WFL for tasks assessed/performed Overall Cognitive Status: Within Functional Limits for tasks assessed                      Exercises      General Comments        Pertinent Vitals/Pain Pain Assessment: Faces Faces Pain Scale: Hurts little more Pain Location: abdomen and back Pain Descriptors / Indicators: Aching;Sore Pain Intervention(s): Monitored during session;Repositioned;Patient requesting pain meds-RN notified    Home Living                      Prior Function            PT Goals (current goals can now be found in the care plan section) Acute Rehab PT Goals Patient Stated Goal: to go home with her daughter PT Goal Formulation: With patient Time For Goal Achievement: 06/13/16 Potential to Achieve Goals: Good Progress towards PT goals: Progressing toward goals    Frequency  Min 2X/week    PT Plan Current plan remains appropriate    Co-evaluation             End of Session Equipment Utilized  During Treatment: Gait belt Activity Tolerance: Patient tolerated treatment well Patient left: with call bell/phone within reach;in bed;with bed alarm set;with family/visitor present;with nursing/sitter in room     Time: [id number] PT Time Calculation (min) (ACUTE ONLY): 36 min  Charges:  $Gait Training: 8-22 mins $Therapeutic Activity: 8-22 mins                    G Codes:      Salina April, PTA Pager: [phone]   06/03/2016, 1:02 PM

## 2016-06-03 NOTE — Progress Notes (Signed)
Patient seen as second opinion consult.  Patient with history of cirrhosis and portal hypertension, hepatic encephalopathy, with pancolitis and diffuse colonic thickening and diarrhea.  Patient had recent acute stroke of cerebellum.  Discussed at length with patient's family.  Will not do endoscopic procedures due to prohibitive risk given age, comorbidities and recent stroke.   Would treat with lactulose once-a-day, adjust dose as needed for 3-4 stools per day.  Cholestyramine as needed ok, if more than 6 stools in a day, but would not use this regularly.  Would otherwise monitor and manage supportively.  No further intervention anticipated, I am happy to see back as outpatient on as-needed.

## 2016-06-03 NOTE — Telephone Encounter (Signed)
Caller name: Manus Gunning  Relation to pt: Referral Specialist from [place]  Call back number: [phone] Pharmacy:  Reason for call:  Patient requesting Dr. Charlett Blake as a  covering physician while patient is currently in hospice requesting verbal orders.

## 2016-06-04 ENCOUNTER — Encounter: Payer: Self-pay | Admitting: *Deleted

## 2016-06-04 ENCOUNTER — Other Ambulatory Visit: Payer: Self-pay | Admitting: *Deleted

## 2016-06-04 LAB — BASIC METABOLIC PANEL
ANION GAP: 4 — AB (ref 5–15)
BUN: 11 mg/dL (ref 6–20)
CALCIUM: 7.6 mg/dL — AB (ref 8.9–10.3)
CO2: 24 mmol/L (ref 22–32)
CREATININE: 0.81 mg/dL (ref 0.44–1.00)
Chloride: 109 mmol/L (ref 101–111)
Glucose, Bld: 149 mg/dL — ABNORMAL HIGH (ref 65–99)
Potassium: 3.7 mmol/L (ref 3.5–5.1)
SODIUM: 137 mmol/L (ref 135–145)

## 2016-06-04 LAB — GLUCOSE, CAPILLARY
GLUCOSE-CAPILLARY: 199 mg/dL — AB (ref 65–99)
GLUCOSE-CAPILLARY: 220 mg/dL — AB (ref 65–99)

## 2016-06-04 MED ORDER — BIOTENE DRY MOUTH MT LIQD: 15.0000 mL | OROMUCOSAL | Status: DC | PRN

## 2016-06-04 MED ORDER — BIOTENE DRY MOUTH MT LIQD: 15.0000 mL | OROMUCOSAL | 0 refills | Status: AC | PRN

## 2016-06-04 MED ORDER — VANCOMYCIN 50 MG/ML ORAL SOLUTION: 250.0000 mg | Freq: Four times a day (QID) | ORAL | 0 refills | Status: AC

## 2016-06-04 MED ORDER — LACTULOSE 10 GM/15ML PO SOLN: 20.0000 g | Freq: Every day | ORAL | 0 refills | Status: AC

## 2016-06-04 MED ORDER — LACTULOSE 10 GM/15ML PO SOLN: 20.0000 g | Freq: Every day | ORAL | 0 refills | Status: AC | PRN

## 2016-06-04 NOTE — Patient Outreach (Signed)
[facility]) Care Management  06/04/2016  Monique Fleming 1929/05/19 [id number]   Pt was referred to this RN case manager for community and transition of care however upon pt discharge she is under the care of [facility]. RN confirmed this information wit the agency and Dr. Charlett Blake has provided the required orders for this service. RN will not engage due to another case management services involved and pt no longer eligible for [facility] services. Case will be closed and Dr. Charlett Blake will notified.  Raina Mina, RN Care Management Coordinator [place] Office [phone]

## 2016-06-04 NOTE — Progress Notes (Signed)
Bennie Hind to be D/C'd to home with hospice per MD order.  Discussed with the patient and all questions fully answered.  VSS, Skin clean, dry and intact without evidence of skin break down, no evidence of skin tears noted. IV catheter discontinued intact. Site without signs and symptoms of complications. Dressing and pressure applied.  An After Visit Summary was printed and given to the patient. Patient received prescription.  D/c education completed with patient/family including follow up instructions, medication list, d/c activities limitations if indicated, with other d/c instructions as indicated by MD - patient able to verbalize understanding, all questions fully answered.   Patient instructed to return to ED, call 911, or call MD for any changes in condition.   Patient escorted via [place], and D/C home via private auto.  Morley Kos Price 06/04/2016 1:26 PM

## 2016-06-04 NOTE — Telephone Encounter (Signed)
HHRN informed of verbal ok. 

## 2016-06-04 NOTE — Discharge Summary (Signed)
Physician Discharge Summary  Monique Fleming [id number] DOB: Jan 09, 1929 DOA: 05/26/2016  PCP: Penni Homans, MD  Admit date: 05/26/2016 Discharge date: 06/04/2016  Admitted From: Home  Disposition:  Home with hospice   Recommendations for Outpatient Follow-up:  1. Follow up with PCP in 1-2 weeks 2. Please obtain BMP/CBC in one week 3. Please follow up on the following pending results: consider checking Iron, ferritin level, hb if family dessier further treatments.    Discharge Condition: Stable.  CODE STATUS: DNR Diet recommendation: Heart Healthy   Brief/Interim Summary:  1-Acute on chronic GI bleeding/Acute blood loss anemiashe was briefly on octreotide initially on presentation due to history of portal hypertension and cirrhosis - gi bleed likely from colitis, she also has h/o portal gastropathy and colonic AVM's. S/p 2units of prbc transfusion on 8/23. 1uint prbc transfusion on 8/27, [facility] consulted recommend medical management due to significant risk of stroke with invasive intervention.   -Diffuse pan colitis with diarrhea- negative stool studies. GI recommended treat for C diff with total of 14days of vanc due to high suspicion for c diff colitis - lactulose to daily.  PRN dose if patient doesn't have 4 BM in 24 hours.  -Continue with vancomycin, to finished treatment. 6 more days.  -Appreciate Dr Paulita Fujita consultation for second opinion.  -Sequestrant PRN for more than 6 BM, not to use on daily basis. Discussed with daughter.    Acute encephalopathy probably secondary to hepatic encephalopathy/ and metabolic encephalopathy from colitis- on lactulose.  Xifaxan stopped per GI recommendation due to concerning for c diff.  On 8/24 and 8/25, patient has been awake in am, then become very drowsy, not arousable to sternal rubs in pm,  CT head/abg unremarkable, EEG with metabolic changes, but no seizure spikes, ammonia better, all labs continue to improve, b12 is high /folate wnl  /rpr/hiv negative, dilantin level is low, MRI + acute infarct left cerebellum. She is not a candidate for plavix or statin.  fortunately patient's mental status has since much improved, she has been fully alert and interactive on 8/28 and 8/29.-8-30-8-31  COPD - no wheezing, on room air, Continue inhalers.  Diabetes mellitus type 2- noninsulin dependent, resume metformin at discharge.  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 Not a candidate for statin due to cirrhosis, option is limited, overall prognosis is poor.Explain to daughter why patient has not been on plavix, due to concern for  GI bleed. Further evaluation outpatient per PCP/   8.. S/p right colectomy, ileocolic anastomosis for colon cancer in 2008.  9. Ascites; Abdominal distension, S/P paracentesis for symptoms management.    Discharge Diagnoses:  Principal Problem:   Acute GI bleeding Active Problems:   Generalized convulsive epilepsy ([place])   COPD with chronic bronchitis ([place])   Angiodysplasia of intestine   Diabetes mellitus, type 2 ([place])   Acute encephalopathy   Hepatic cirrhosis ([facility])   Confusion   Diarrhea of presumed infectious origin   Palliative care encounter   Abdominal distension    Discharge Instructions  Discharge Instructions    AMB Referral to [facility]    Complete by:  As directed   Reason for consult:  Multiple hospitalizations   Diagnoses of:   COPD/ Pneumonia Diabetes     Expected date of contact:  1-3 days (reserved for hospital discharges)   Please assign to community nurse for  transition of care calls and assess for home visits.  PLEASE CONTACT daughters for any appointments and follow up.  Brettney Sanderlin at [phone] or Lanier Clam  [phone].   Questions please call:   Natividad Brood, RN BSN [facility] Liaison  [phone] business mobile phone Toll free office [phone]   Diet - low sodium heart healthy    Complete by:  As directed   Increase activity slowly    Complete by:  As directed       Medication List    STOP taking these medications   clopidogrel 75 MG tablet Commonly known as:  PLAVIX   rifaximin 550 MG Tabs tablet Commonly known as:  XIFAXAN   SYSTANE 0.4-0.3 % Soln Generic drug:  Polyethyl Glycol-Propyl Glycol     TAKE these medications   albuterol 108 (90 Base) MCG/ACT inhaler Commonly known as:  PROAIR HFA Inhale 2 puffs into the lungs every 4 (four) hours as needed for wheezing or shortness of breath.   CARNATION INSTANT BREAKFAST PO Take 1 each by mouth 3 (three) times daily.   cholestyramine 4 g packet Commonly known as:  QUESTRAN MIX 1 PACKET (4 GRAM TOTAL) IN LIQUID AND DRINK DAILY AS NEEDED FOR DIARRHEA   clonazePAM 1 MG tablet Commonly known as:  KLONOPIN Take 1 tablet (1 mg total) by mouth 2 (two) times daily as needed for anxiety. What changed:  when to take this  additional instructions   desvenlafaxine 100 MG 24 hr tablet Commonly known as:  PRISTIQ Take 100 mg by mouth daily.   fluticasone 110 MCG/ACT inhaler Commonly known as:  FLOVENT HFA Inhale 2 puffs into the lungs 2 (two) times daily. What changed:  when to take this  reasons to take this   fluticasone 50 MCG/ACT nasal spray Commonly known as:  FLONASE USE 2 SPRAYS IN EACH NOSTRIL DAILY AS NEEDED FOR ALLERGIES   formoterol 20 MCG/2ML nebulizer solution Commonly known as:  PERFOROMIST Take 2 mLs (20 mcg total) by nebulization 2 (two) times daily.   glucose blood test strip Commonly known as:  BAYER CONTOUR NEXT TEST Use as directed once daily to check blood sugar.  DX E11.9   lactulose 10 GM/15ML solution Commonly known as:  CHRONULAC Take 30 mLs (20 g total) by mouth daily. What changed:  how much to take  how to take this  when to take  this  additional instructions   lactulose 10 GM/15ML solution Commonly known as:  CHRONULAC Take 30 mLs (20 g total) by mouth daily as needed for mild constipation (if patient doesnt have 4 BM in 24 hours.). What changed:  You were already taking a medication with the same name, and this prescription was added. Make sure you understand how and when to take each.   Magnesium 400 MG Tabs Take 400 mg by mouth 2 (two) times daily. What changed:  how much to take  additional instructions   metFORMIN 500 MG tablet Commonly known as:  GLUCOPHAGE TAKE ONE-HALF (1/2) TO ONE TABLET DAILY WITH BREAKFAST What changed:  See the new instructions.   montelukast 10 MG tablet Commonly known as:  SINGULAIR Take 1 tablet (10 mg total) by mouth at bedtime.   OCUVITE PRESERVISION Tabs Take 1 tablet by mouth 2 (two) times daily.   ondansetron 4 MG tablet Commonly known as:  ZOFRAN Take 1 tablet (4 mg total) by mouth every 6 (six) hours as needed for nausea or vomiting.   phenytoin 100  MG ER capsule Commonly known as:  DILANTIN Take 2 capsules (200 mg total) by mouth at bedtime. Reported on 02/13/2016   potassium chloride 10 MEQ tablet Commonly known as:  K-DUR Take 1 tablet (10 mEq total) by mouth 2 (two) times daily.   ranitidine 300 MG tablet Commonly known as:  ZANTAC Take 1 tablet daily.   saccharomyces boulardii 250 MG capsule Commonly known as:  FLORASTOR Take 250 mg by mouth 2 (two) times daily.   sodium chloride 0.65 % Soln nasal spray Commonly known as:  OCEAN Place 2 sprays into both nostrils daily as needed for congestion.   traMADol 50 MG tablet Commonly known as:  ULTRAM Take 2 tablets (100 mg total) by mouth every 12 (twelve) hours as needed for severe pain.   vancomycin 50 mg/mL oral solution Commonly known as:  VANCOCIN Take 5 mLs (250 mg total) by mouth every 6 (six) hours.      Follow-up Information    [facility] .   Why:  Home hospice  arranged Contact information: [place] [address] [phone]          Allergies  Allergen Reactions  . Bactrim [Sulfamethoxazole-Trimethoprim] Shortness Of Breath  . Levofloxacin Other (See Comments)    seizure  . Pneumovax [Pneumococcal Polysaccharide Vaccine] Other (See Comments)    Severe allergy-cellulitis  . Amoxicillin-Pot Clavulanate Other (See Comments)    Patient stated Drs. recommend taking Augmentin due to her PCN allergy.  . Penicillins Rash    Has patient had a PCN reaction causing immediate rash, facial/tongue/throat swelling, SOB or lightheadedness with hypotension: No Has patient had a PCN reaction causing severe rash involving mucus membranes or skin necrosis: No Has patient had a PCN reaction that required hospitalization No Has patient had a PCN reaction occurring within the last 10 years: No If all of the above answers are "NO", then may proceed with Cephalosporin use.   . Bupropion Other (See Comments)    Hallucinating   . Haldol [Haloperidol Lactate]     Had unknown reaction many yrs ago per pt  . Morphine And Related     Confused and agitation  . Sudafed [Pseudoephedrine Hcl] Other (See Comments)    seizure    Consultations:  [facility]  [facility]  Dr hung , GI phone consultation.      Procedures/Studies: Dg Chest 2 View  Result Date: 05/26/2016 CLINICAL DATA:  Altered mental status. History of asthma and bronchitis. COPD. EXAM: CHEST  2 VIEW COMPARISON:  Chest radiograph 05/01/2016 FINDINGS: Shallow lung inflation with diffusely increased pulmonary interstitial markings, similar prior study. Cardiomediastinal contours are within normal limits when allowing for AP technique. No focal airspace consolidation or pulmonary edema. No pneumothorax or sizable pleural effusion. IMPRESSION: No radiographic evidence of pneumonia. Electronically Signed   By: Ulyses Jarred M.D.   On: 05/26/2016 22:22   Ct Head Wo Contrast  Result  Date: 05/28/2016 CLINICAL DATA:  Lethargy EXAM: CT HEAD WITHOUT CONTRAST TECHNIQUE: Contiguous axial images were obtained from the base of the skull through the vertex without intravenous contrast. COMPARISON:  05/27/2016 FINDINGS: Brain: No evidence of acute infarction, hemorrhage, hydrocephalus, extra-axial collection or mass lesion/mass effect. Old lacunar infarcts in the bilateral cerebellum. Tiny lacunar infarct in the right external capsule. Vascular: No hyperdense vessel or unexpected calcification. Skull: No evidence of calvarial fracture. Sinuses/Orbits: The visualized paranasal sinuses are essentially clear. The mastoid air cells are unopacified. Other: Global cortical atrophy.  No  ventriculomegaly. Subcortical white matter and periventricular small vessel ischemic changes. Intracranial atherosclerosis. IMPRESSION: No evidence of acute intracranial abnormality. Atrophy with small vessel ischemic changes. Old bilateral cerebellar infarcts. Electronically Signed   By: Julian Hy M.D.   On: 05/28/2016 20:18   Ct Head Wo Contrast  Result Date: 05/27/2016 CLINICAL DATA:  82 y/o F; altered mental status with history of TIA and seizures. EXAM: CT HEAD WITHOUT CONTRAST TECHNIQUE: Contiguous axial images were obtained from the base of the skull through the vertex without intravenous contrast. COMPARISON:  None. FINDINGS: Brain: No evidence of acute infarction, hemorrhage, hydrocephalus, extra-axial collection or mass lesion/mass effect. Stable moderate parenchymal volume loss and chronic microvascular ischemic changes. Stable bilateral cerebellar and left occipital small chronic infarcts. Vascular: No hyperdense vessel. Extensive calcific atherosclerosis of cavernous internal carotid arteries and vertebral arteries. Skull: Negative. Sinuses/Orbits: Bilateral intra-ocular lens replacement. No acute finding. Other: Bilateral high-riding jugular bulbs within the sigmoid plates. IMPRESSION: No acute  intracranial abnormality. Stable parenchymal volume loss, chronic microvascular ischemic changes and small chronic infarcts. Electronically Signed   By: Kristine Garbe M.D.   On: 05/27/2016 01:17   Mr Brain Wo Contrast  Result Date: 05/29/2016 CLINICAL DATA:  Confusion EXAM: MRI HEAD WITHOUT CONTRAST TECHNIQUE: Multiplanar, multiecho pulse sequences of the brain and surrounding structures were obtained without intravenous contrast. COMPARISON:  CT 05/28/2016.  MRI 02/28/2016 FINDINGS: Incomplete study. The patient was confused and was not able to complete the study. Acute infarct in the left cerebellum measuring approximately 10 x 15 mm. No other acute infarct identified. Generalized atrophy.  Negative for hydrocephalus. Chronic microvascular ischemic change in the white matter bilaterally. Chronic infarct in the right cerebellum. Chronic infarct in the left cerebellum. Multiple areas of chronic micro hemorrhage in the cerebral hemispheres bilaterally. Most of these are cortically based hemorrhage. There is also chronic micro hemorrhage in the pons and in the cerebellum bilaterally. IMPRESSION: Acute infarct left cerebellum Atrophy and chronic ischemia Multiple areas of chronic hemorrhage in the brain which may be due to poorly controlled hypertension. Patient not able to complete the study. Electronically Signed   By: Franchot Gallo M.D.   On: 05/29/2016 17:40   Ct Abdomen Pelvis W Contrast  Result Date: 05/27/2016 CLINICAL DATA:  Abdominal pain and diarrhea. EXAM: CT ABDOMEN AND PELVIS WITH CONTRAST TECHNIQUE: Multidetector CT imaging of the abdomen and pelvis was performed using the standard protocol following bolus administration of intravenous contrast. CONTRAST:  55mL ISOVUE-300 IOPAMIDOL (ISOVUE-300) INJECTION 61% COMPARISON:  08/21/2014 FINDINGS: Lower chest and abdominal wall: No acute finding. Emphysematous changes. Hepatobiliary: Cirrhotic liver without visible mass lesion. There is  portal hypertension with recannulated umbilical vein, small ascites, splenomegaly, and severe hemorrhoids.Cholecystectomy with normal common bile duct diameter Pancreas: Unremarkable. Spleen: Enlarged in the setting of portal hypertension. Adrenals/Urinary Tract: Negative adrenals. Small bilateral hilar calcifications likely combination of vascular and urinary. Bilateral renal cysts which appear simple, measuring up to 7.3 cm exophytic from the left upper pole. Unremarkable bladder. Stomach/Bowel: Ileocolic anastomosis on the right. There is pan colonic circumferential thickening with intermittent mesenteric fat edema. No bowel obstruction or perforation. Severe internal hemorrhoids. High-density structures within the stomach lumen are likely ingested. Reproductive:Incidental 2 cm simple appearing right ovarian cyst. Vascular/Lymphatic: Sequela of portal hypertension as described above. 31 mm fusiform abdominal aortic aneurysm, of unlikely clinical significance given patient's comorbidities. Extensive atherosclerotic calcification. No mass or adenopathy. Other: Small ascites. Musculoskeletal: No acute abnormalities. IMPRESSION: 1. Pancolitis, likely infectious.  Consider C difficile colitis. 2.  Cirrhosis with portal hypotension. 3.  Emphysema. (ICD10-J43.9) 4. Aortic Atherosclerosis (ICD10-170.0) with 31 mm diameter abdominal aortic aneurysm. Electronically Signed   By: Monte Fantasia M.D.   On: 05/27/2016 01:22   US Paracentesis  Result Date: 06/03/2016 INDICATION: Cirrhosis. Abdominal distention secondary to ascites. Request therapeutic paracentesis. EXAM: ULTRASOUND GUIDED RIGHT LOWER QUADRANT PARACENTESIS MEDICATIONS: None. COMPLICATIONS: None immediate. PROCEDURE: Informed written consent was obtained from the patient after a discussion of the risks, benefits and alternatives to treatment. A timeout was performed prior to the initiation of the procedure. Initial ultrasound scanning demonstrates a large  amount of ascites within the right lower abdominal quadrant. The right lower abdomen was prepped and draped in the usual sterile fashion. 1% lidocaine with epinephrine was used for local anesthesia. Following this, a Safe-T-Centesis catheter was introduced. An ultrasound image was saved for documentation purposes. The paracentesis was performed. The catheter was removed and a dressing was applied. The patient tolerated the procedure well without immediate post procedural complication. FINDINGS: A total of approximately 1.5 L of hazy, pale yellow fluid was removed. IMPRESSION: Successful ultrasound-guided paracentesis yielding 1.5 liters of peritoneal fluid. Read by: Ascencion Dike PA-C Electronically Signed   By: Sandi Mariscal M.D.   On: 06/03/2016 09:49   Dg Abd Portable 1v  Result Date: 05/28/2016 CLINICAL DATA:  Abdominal distension EXAM: PORTABLE ABDOMEN - 1 VIEW COMPARISON:  CT scan May 27, 2016 FINDINGS: No evidence of bowel obstruction. No acute abnormalities are seen on this single supine frontal view of the abdomen and pelvis. IMPRESSION: No cause for distention identified. Electronically Signed   By: Dorise Bullion III M.D   On: 05/28/2016 18:50     Subjective: She is alert, denies BM today. She is eager to go home today.  She denies abdominal pain.   Discharge Exam: Vitals:   06/03/16 2212 06/04/16 0539  BP: (!) 110/33 (!) 115/55  Pulse: 81 90  Resp: 17 18  Temp: 98.1 F (36.7 C) 98.1 F (36.7 C)   Vitals:   06/03/16 2212 06/03/16 2215 06/04/16 0539 06/04/16 0755  BP: (!) 110/33  (!) 115/55   Pulse: 81  90   Resp: 17  18   Temp: 98.1 F (36.7 C)  98.1 F (36.7 C)   TempSrc: Oral  Oral   SpO2: 97% 95% 100% 98%  Weight:   61.1 kg (134 lb 12.8 oz)   Height:        General: Pt is alert, awake, not in acute distress Cardiovascular: RRR, S1/S2 +, no rubs, no gallops Respiratory: CTA bilaterally, no wheezing, no rhonchi Abdominal: Soft, NT, ND, bowel sounds + Extremities:  no edema, no cyanosis    The results of significant diagnostics from this hospitalization (including imaging, microbiology, ancillary and laboratory) are listed below for reference.     Microbiology: Recent Results (from the past 240 hour(s))  C difficile quick scan w PCR reflex     Status: None   Collection Time: 05/26/16 10:40 PM  Result Value Ref Range Status   C Diff antigen NEGATIVE NEGATIVE Final   C Diff toxin NEGATIVE NEGATIVE Final   C Diff interpretation No C. difficile detected.  Final  Urine culture     Status: None   Collection Time: 05/26/16 10:45 PM  Result Value Ref Range Status   Specimen Description URINE, RANDOM  Final   Special Requests NONE  Final   Culture NO GROWTH  Final   Report Status 05/28/2016 FINAL  Final  Blood culture (  routine x 2)     Status: None   Collection Time: 05/26/16 11:30 PM  Result Value Ref Range Status   Specimen Description BLOOD LEFT HAND  Final   Special Requests IN PEDIATRIC BOTTLE 3CC  Final   Culture NO GROWTH 7 DAYS  Final   Report Status 06/03/2016 FINAL  Final  Blood culture (routine x 2)     Status: None   Collection Time: 05/26/16 11:35 PM  Result Value Ref Range Status   Specimen Description BLOOD RIGHT HAND  Final   Special Requests BOTTLES DRAWN AEROBIC AND ANAEROBIC 5CC  Final   Culture NO GROWTH 7 DAYS  Final   Report Status 06/03/2016 FINAL  Final  Gastrointestinal Panel by PCR , Stool     Status: None   Collection Time: 05/27/16  3:07 AM  Result Value Ref Range Status   Campylobacter species NOT DETECTED NOT DETECTED Final   Plesimonas shigelloides NOT DETECTED NOT DETECTED Final   Salmonella species NOT DETECTED NOT DETECTED Final   Yersinia enterocolitica NOT DETECTED NOT DETECTED Final   Vibrio species NOT DETECTED NOT DETECTED Final   Vibrio cholerae NOT DETECTED NOT DETECTED Final   Enteroaggregative E coli (EAEC) NOT DETECTED NOT DETECTED Final   Enteropathogenic E coli (EPEC) NOT DETECTED NOT DETECTED  Final   Enterotoxigenic E coli (ETEC) NOT DETECTED NOT DETECTED Final   Shiga like toxin producing E coli (STEC) NOT DETECTED NOT DETECTED Final   E. coli O157 NOT DETECTED NOT DETECTED Final   Shigella/Enteroinvasive E coli (EIEC) NOT DETECTED NOT DETECTED Final   Cryptosporidium NOT DETECTED NOT DETECTED Final   Cyclospora cayetanensis NOT DETECTED NOT DETECTED Final   Entamoeba histolytica NOT DETECTED NOT DETECTED Final   Giardia lamblia NOT DETECTED NOT DETECTED Final   Adenovirus F40/41 NOT DETECTED NOT DETECTED Final   Astrovirus NOT DETECTED NOT DETECTED Final   Norovirus GI/GII NOT DETECTED NOT DETECTED Final   Rotavirus A NOT DETECTED NOT DETECTED Final   Sapovirus (I, II, IV, and V) NOT DETECTED NOT DETECTED Final     Labs: BNP (last 3 results)  Recent Labs  02/27/16 1520  BNP 0000000*   Basic Metabolic Panel:  Recent Labs Lab 05/29/16 0422 05/30/16 0643 05/31/16 0544 06/01/16 0607 06/02/16 0639 06/03/16 0500 06/04/16 0242  NA 143 144 140 139 137 138 137  K 3.3* 3.4* 3.6 4.2 4.1 3.7 3.7  CL 115* 116* 116* 113* 113* 110 109  CO2 21* 21* 21* 20* 19* 22 24  GLUCOSE 144* 133* 148* 129* 202* 214* 149*  BUN 51* 26* 24* 19 15 10 11   CREATININE 1.05* 0.84 0.90 0.85 0.77 0.81 0.81  CALCIUM 7.8* 7.6* 7.3* 7.6* 7.4* 7.4* 7.6*  MG 1.7 1.5* 2.0  --  1.6* 1.8  --    Liver Function Tests:  Recent Labs Lab 05/30/16 0643 05/31/16 0544  AST 42* 37  ALT 19 19  ALKPHOS 59 63  BILITOT 0.7 0.7  PROT 4.8* 4.8*  ALBUMIN 2.1* 2.1*   No results for input(s): LIPASE, AMYLASE in the last 168 hours.  Recent Labs Lab 05/30/16 [phone] 05/31/16 0544 06/01/16 0607 06/02/16 0638 06/03/16 0504  AMMONIA 41* 40* 35 31 42*   CBC:  Recent Labs Lab 05/30/16 0643 05/31/16 0544 06/01/16 0607 06/02/16 0639 06/03/16 0500  WBC 10.3 12.2* 11.2* 8.5 7.2  HGB 7.5* 7.4* 8.4* 8.1* 8.1*  HCT 24.5* 24.3* 26.9* 26.6* 26.1*  MCV 93.9 93.8 92.8 93.0 91.6  PLT 130* 129*  125* 125* 136*    Cardiac Enzymes: No results for input(s): CKTOTAL, CKMB, CKMBINDEX, TROPONINI in the last 168 hours. BNP: Invalid input(s): POCBNP CBG:  Recent Labs Lab 06/03/16 1210 06/03/16 1637 06/03/16 2210 06/04/16 0748 06/04/16 1143  GLUCAP 195* 211* 226* 199* 220*   D-Dimer No results for input(s): DDIMER in the last 72 hours. Hgb A1c No results for input(s): HGBA1C in the last 72 hours. Lipid Profile No results for input(s): CHOL, HDL, LDLCALC, TRIG, CHOLHDL, LDLDIRECT in the last 72 hours. Thyroid function studies No results for input(s): TSH, T4TOTAL, T3FREE, THYROIDAB in the last 72 hours.  Invalid input(s): FREET3 Anemia work up No results for input(s): VITAMINB12, FOLATE, FERRITIN, TIBC, IRON, RETICCTPCT in the last 72 hours. Urinalysis    Component Value Date/Time   COLORURINE YELLOW 05/26/2016 2245   APPEARANCEUR CLEAR 05/26/2016 2245   LABSPEC 1.024 05/26/2016 2245   PHURINE 5.5 05/26/2016 2245   GLUCOSEU NEGATIVE 05/26/2016 2245   GLUCOSEU NEGATIVE 08/13/2011 0846   HGBUR NEGATIVE 05/26/2016 2245   HGBUR negative 02/04/2009 1309   BILIRUBINUR NEGATIVE 05/26/2016 2245   BILIRUBINUR neg 05/01/2016 1110   KETONESUR NEGATIVE 05/26/2016 2245   PROTEINUR NEGATIVE 05/26/2016 2245   UROBILINOGEN 0.2 05/01/2016 1110   UROBILINOGEN 1.0 05/25/2015 1757   NITRITE NEGATIVE 05/26/2016 2245   LEUKOCYTESUR NEGATIVE 05/26/2016 2245   Sepsis Labs Invalid input(s): PROCALCITONIN,  WBC,  LACTICIDVEN Microbiology Recent Results (from the past 240 hour(s))  C difficile quick scan w PCR reflex     Status: None   Collection Time: 05/26/16 10:40 PM  Result Value Ref Range Status   C Diff antigen NEGATIVE NEGATIVE Final   C Diff toxin NEGATIVE NEGATIVE Final   C Diff interpretation No C. difficile detected.  Final  Urine culture     Status: None   Collection Time: 05/26/16 10:45 PM  Result Value Ref Range Status   Specimen Description URINE, RANDOM  Final   Special Requests  NONE  Final   Culture NO GROWTH  Final   Report Status 05/28/2016 FINAL  Final  Blood culture (routine x 2)     Status: None   Collection Time: 05/26/16 11:30 PM  Result Value Ref Range Status   Specimen Description BLOOD LEFT HAND  Final   Special Requests IN PEDIATRIC BOTTLE 3CC  Final   Culture NO GROWTH 7 DAYS  Final   Report Status 06/03/2016 FINAL  Final  Blood culture (routine x 2)     Status: None   Collection Time: 05/26/16 11:35 PM  Result Value Ref Range Status   Specimen Description BLOOD RIGHT HAND  Final   Special Requests BOTTLES DRAWN AEROBIC AND ANAEROBIC 5CC  Final   Culture NO GROWTH 7 DAYS  Final   Report Status 06/03/2016 FINAL  Final  Gastrointestinal Panel by PCR , Stool     Status: None   Collection Time: 05/27/16  3:07 AM  Result Value Ref Range Status   Campylobacter species NOT DETECTED NOT DETECTED Final   Plesimonas shigelloides NOT DETECTED NOT DETECTED Final   Salmonella species NOT DETECTED NOT DETECTED Final   Yersinia enterocolitica NOT DETECTED NOT DETECTED Final   Vibrio species NOT DETECTED NOT DETECTED Final   Vibrio cholerae NOT DETECTED NOT DETECTED Final   Enteroaggregative E coli (EAEC) NOT DETECTED NOT DETECTED Final   Enteropathogenic E coli (EPEC) NOT DETECTED NOT DETECTED Final   Enterotoxigenic E coli (ETEC) NOT DETECTED NOT DETECTED Final   Shiga like toxin producing  E coli (STEC) NOT DETECTED NOT DETECTED Final   E. coli O157 NOT DETECTED NOT DETECTED Final   Shigella/Enteroinvasive E coli (EIEC) NOT DETECTED NOT DETECTED Final   Cryptosporidium NOT DETECTED NOT DETECTED Final   Cyclospora cayetanensis NOT DETECTED NOT DETECTED Final   Entamoeba histolytica NOT DETECTED NOT DETECTED Final   Giardia lamblia NOT DETECTED NOT DETECTED Final   Adenovirus F40/41 NOT DETECTED NOT DETECTED Final   Astrovirus NOT DETECTED NOT DETECTED Final   Norovirus GI/GII NOT DETECTED NOT DETECTED Final   Rotavirus A NOT DETECTED NOT DETECTED Final    Sapovirus (I, II, IV, and V) NOT DETECTED NOT DETECTED Final     Time coordinating discharge: Over 30 minutes  SIGNED:   Elmarie Shiley, MD  [facility] 06/04/2016, 12:08 PM Pager [phone]  If 7PM-7AM, please contact night-coverage [email or web address] Password [email or web address]

## 2016-06-04 NOTE — Care Management Important Message (Signed)
Important Message  Patient Details  Name: Monique Fleming MRN: [id number] Date of Birth: Nov 23, 1928   Medicare Important Message Given:  Yes    Nathen May 06/04/2016, 10:53 AM

## 2016-06-04 NOTE — Progress Notes (Signed)
Inpatient Diabetes Program Recommendations  AACE/ADA: New Consensus Statement on Inpatient Glycemic Control (2015)  Target Ranges:  Prepandial:   less than 140 mg/dL      Peak postprandial:   less than 180 mg/dL (1-2 hours)      Critically ill patients:  140 - 180 mg/dL   Results for [name], Monique Fleming (MRN [id number]) as of 06/04/2016 11:58  Ref. Range 06/03/2016 08:16 06/03/2016 12:10 06/03/2016 16:37 06/03/2016 22:10 06/04/2016 07:48 06/04/2016 11:43  Glucose-Capillary Latest Ref Range: 65 - 99 mg/dL 181 (H) 195 (H) 211 (H) 226 (H) 199 (H) 220 (H)   Review of Glycemic Control  Diabetes history: DM2 Outpatient Diabetes medications: Metformin 500 mg QAM Current orders for Inpatient glycemic control: Novolog 0-9 units ACHS  Inpatient Diabetes Program Recommendations: Insulin - Basal: Please consider ordering Levemir 5 units QHS.  Thanks, Barnie Alderman, RN, MSN, CDE Diabetes Coordinator Inpatient Diabetes Program [phone] (Team Pager from [place] to [place]) [phone] ([facility] office) [phone] [facility] office) [phone] [facility] office)

## 2016-06-04 NOTE — Progress Notes (Signed)
Palliative:  Ms. Servin is very much ready to go home today. Abd does not seem anymore distended today - stable. Diarrhea seems to be slowing some. Discussed with daughter, [name], over phone and she is happy with their conversation with Dr. Paulita Fujita (much appreciate his assistance) and is happy with the plan for home with hospice today. No further needs.   No Charge  Vinie Sill, NP Palliative Medicine Team Pager # [phone] (M-F 8a-5p) Team Phone # [phone] (Nights/Weekends)

## 2016-06-05 ENCOUNTER — Telehealth: Payer: Self-pay | Admitting: Behavioral Health

## 2016-06-05 NOTE — Telephone Encounter (Signed)
Unable to reach patient/caregiver for TCM/Hospital Follow-up call. Left message for patient/caregiver to return call when available.

## 2016-06-09 ENCOUNTER — Encounter: Payer: Self-pay | Admitting: Family Medicine

## 2016-06-09 ENCOUNTER — Encounter: Payer: Self-pay | Admitting: Cardiovascular Disease

## 2016-06-09 ENCOUNTER — Encounter: Payer: Self-pay | Admitting: Hematology & Oncology

## 2016-06-10 NOTE — Telephone Encounter (Signed)
I cant speak for Dr. Charlett Blake regarding if she will be attending. I would look through records to see if you have received orders from Hospice needing signature. I would recommend calling [facility] to see what is needed as PCP is out of office until Friday.  Also please touch base with patient's daughter.

## 2016-06-11 ENCOUNTER — Ambulatory Visit: Payer: Medicare Other

## 2016-06-11 ENCOUNTER — Other Ambulatory Visit: Payer: Medicare Other

## 2016-06-11 ENCOUNTER — Ambulatory Visit: Payer: Medicare Other | Admitting: Family

## 2016-06-12 ENCOUNTER — Other Ambulatory Visit: Payer: Self-pay | Admitting: Family Medicine

## 2016-06-12 DIAGNOSIS — R188 Other ascites: Secondary | ICD-10-CM

## 2016-06-12 DIAGNOSIS — R0602 Shortness of breath: Secondary | ICD-10-CM

## 2016-06-12 DIAGNOSIS — K769 Liver disease, unspecified: Secondary | ICD-10-CM

## 2016-06-12 MED ORDER — SPIRONOLACTONE 25 MG PO TABS: 25.0000 mg | ORAL_TABLET | Freq: Every day | ORAL | 3 refills | Status: AC

## 2016-06-12 MED ORDER — CLONAZEPAM 1 MG PO TABS: ORAL_TABLET | ORAL | Status: AC

## 2016-06-12 NOTE — Telephone Encounter (Signed)
Faxed hardcopy for Clonazepam to [place].

## 2016-06-15 ENCOUNTER — Telehealth: Payer: Self-pay | Admitting: Internal Medicine

## 2016-06-15 ENCOUNTER — Telehealth: Payer: Self-pay

## 2016-06-15 ENCOUNTER — Other Ambulatory Visit: Payer: Self-pay

## 2016-06-15 ENCOUNTER — Encounter: Payer: Self-pay | Admitting: Internal Medicine

## 2016-06-15 DIAGNOSIS — R188 Other ascites: Secondary | ICD-10-CM

## 2016-06-15 NOTE — Telephone Encounter (Signed)
Large-volume paracentesis up to 8 L with albumin replacement. Send fluid for cell count with differential. Thank you

## 2016-06-15 NOTE — Telephone Encounter (Signed)
Pts daughter called and states that pt is now hospice. States she has quite a bit of ascites and if uncomfortable and short of breath, states O2 sats drop when she lays down. Requesting pt has a paracentesis asap. Please advise.

## 2016-06-15 NOTE — Telephone Encounter (Signed)
Spoke with pts daughter and she states pt does not need an OV, states she needs a paracentesis today. Discussed with daughter that these procedures are scheduled several days out. States she will take her mom through the ER to try and have this done as her O2 sats are dropping in the 80's when she lays down.

## 2016-06-15 NOTE — Telephone Encounter (Signed)
Pt scheduled for US guided para at [facility] tomorrow at 2pm in the xray dept. Pt to arrive there at 1:45pm. Pt to receive Albumin 8gm IV for each liter of fluid drawn off. Fluid to be sent for cell count and diff. Pts daughter aware of appt.

## 2016-06-16 ENCOUNTER — Inpatient Hospital Stay (HOSPITAL_COMMUNITY)
Admission: EM | Admit: 2016-06-16 | Discharge: 2016-07-05 | DRG: 189 | Disposition: E | Attending: Internal Medicine | Admitting: Internal Medicine

## 2016-06-16 ENCOUNTER — Encounter (HOSPITAL_COMMUNITY): Payer: Self-pay | Admitting: Emergency Medicine

## 2016-06-16 ENCOUNTER — Ambulatory Visit (HOSPITAL_COMMUNITY): Payer: Medicare Other

## 2016-06-16 DIAGNOSIS — K7290 Hepatic failure, unspecified without coma: Secondary | ICD-10-CM | POA: Diagnosis present

## 2016-06-16 DIAGNOSIS — J9611 Chronic respiratory failure with hypoxia: Secondary | ICD-10-CM | POA: Diagnosis not present

## 2016-06-16 DIAGNOSIS — D649 Anemia, unspecified: Secondary | ICD-10-CM | POA: Diagnosis present

## 2016-06-16 DIAGNOSIS — K7469 Other cirrhosis of liver: Secondary | ICD-10-CM | POA: Diagnosis not present

## 2016-06-16 DIAGNOSIS — K219 Gastro-esophageal reflux disease without esophagitis: Secondary | ICD-10-CM | POA: Diagnosis present

## 2016-06-16 DIAGNOSIS — E1151 Type 2 diabetes mellitus with diabetic peripheral angiopathy without gangrene: Secondary | ICD-10-CM | POA: Diagnosis present

## 2016-06-16 DIAGNOSIS — F329 Major depressive disorder, single episode, unspecified: Secondary | ICD-10-CM | POA: Diagnosis present

## 2016-06-16 DIAGNOSIS — I10 Essential (primary) hypertension: Secondary | ICD-10-CM | POA: Diagnosis present

## 2016-06-16 DIAGNOSIS — Z515 Encounter for palliative care: Secondary | ICD-10-CM | POA: Diagnosis not present

## 2016-06-16 DIAGNOSIS — Z85038 Personal history of other malignant neoplasm of large intestine: Secondary | ICD-10-CM

## 2016-06-16 DIAGNOSIS — Z79899 Other long term (current) drug therapy: Secondary | ICD-10-CM

## 2016-06-16 DIAGNOSIS — Z8673 Personal history of transient ischemic attack (TIA), and cerebral infarction without residual deficits: Secondary | ICD-10-CM

## 2016-06-16 DIAGNOSIS — Z87891 Personal history of nicotine dependence: Secondary | ICD-10-CM

## 2016-06-16 DIAGNOSIS — R0602 Shortness of breath: Secondary | ICD-10-CM | POA: Diagnosis present

## 2016-06-16 DIAGNOSIS — R188 Other ascites: Secondary | ICD-10-CM | POA: Diagnosis present

## 2016-06-16 DIAGNOSIS — Z7984 Long term (current) use of oral hypoglycemic drugs: Secondary | ICD-10-CM | POA: Diagnosis not present

## 2016-06-16 DIAGNOSIS — Z7951 Long term (current) use of inhaled steroids: Secondary | ICD-10-CM | POA: Diagnosis not present

## 2016-06-16 DIAGNOSIS — K766 Portal hypertension: Secondary | ICD-10-CM | POA: Diagnosis present

## 2016-06-16 DIAGNOSIS — J9622 Acute and chronic respiratory failure with hypercapnia: Secondary | ICD-10-CM | POA: Diagnosis present

## 2016-06-16 DIAGNOSIS — J9621 Acute and chronic respiratory failure with hypoxia: Secondary | ICD-10-CM | POA: Diagnosis not present

## 2016-06-16 DIAGNOSIS — J449 Chronic obstructive pulmonary disease, unspecified: Secondary | ICD-10-CM | POA: Diagnosis present

## 2016-06-16 DIAGNOSIS — F419 Anxiety disorder, unspecified: Secondary | ICD-10-CM | POA: Diagnosis present

## 2016-06-16 DIAGNOSIS — J9602 Acute respiratory failure with hypercapnia: Secondary | ICD-10-CM | POA: Diagnosis not present

## 2016-06-16 DIAGNOSIS — G9340 Encephalopathy, unspecified: Secondary | ICD-10-CM | POA: Diagnosis not present

## 2016-06-16 DIAGNOSIS — K7460 Unspecified cirrhosis of liver: Secondary | ICD-10-CM | POA: Diagnosis present

## 2016-06-16 DIAGNOSIS — Z66 Do not resuscitate: Secondary | ICD-10-CM | POA: Diagnosis present

## 2016-06-16 DIAGNOSIS — J441 Chronic obstructive pulmonary disease with (acute) exacerbation: Secondary | ICD-10-CM

## 2016-06-16 MED ORDER — ONDANSETRON 4 MG PO TBDP: 4.0000 mg | ORAL_TABLET | Freq: Four times a day (QID) | ORAL | Status: DC | PRN

## 2016-06-16 MED ORDER — BIOTENE DRY MOUTH MT LIQD: 15.0000 mL | OROMUCOSAL | Status: DC | PRN

## 2016-06-16 MED ORDER — SODIUM CHLORIDE 0.9 % IV SOLN
25.0000 ug/h | INTRAVENOUS | Status: DC
  Administered 2016-06-16: 25 ug/h via INTRAVENOUS
  Administered 2016-06-17: 100 ug/h via INTRAVENOUS
  Filled 2016-06-16 (×2): qty 50

## 2016-06-16 MED ORDER — MAGIC MOUTHWASH: 15.0000 mL | Freq: Four times a day (QID) | ORAL | Status: DC | PRN

## 2016-06-16 MED ORDER — ENSURE ENLIVE PO LIQD: 237.0000 mL | Freq: Two times a day (BID) | ORAL | Status: DC

## 2016-06-16 MED ORDER — ACETAMINOPHEN 650 MG RE SUPP: 650.0000 mg | Freq: Four times a day (QID) | RECTAL | Status: DC | PRN

## 2016-06-16 MED ORDER — LORAZEPAM 2 MG/ML IJ SOLN
1.0000 mg | Freq: Once | INTRAMUSCULAR | Status: AC
  Administered 2016-06-16: 1 mg via INTRAVENOUS
  Filled 2016-06-16: qty 1

## 2016-06-16 MED ORDER — DIPHENHYDRAMINE HCL 50 MG/ML IJ SOLN: 12.5000 mg | INTRAMUSCULAR | Status: DC | PRN

## 2016-06-16 MED ORDER — LORAZEPAM 2 MG/ML PO CONC
1.0000 mg | ORAL | Status: DC | PRN
  Filled 2016-06-16: qty 0.5

## 2016-06-16 MED ORDER — HYDROMORPHONE HCL 1 MG/ML IJ SOLN
1.0000 mg | Freq: Once | INTRAMUSCULAR | Status: AC
  Administered 2016-06-16: 1 mg via INTRAVENOUS
  Filled 2016-06-16: qty 1

## 2016-06-16 MED ORDER — POLYVINYL ALCOHOL 1.4 % OP SOLN: 1.0000 [drp] | Freq: Four times a day (QID) | OPHTHALMIC | Status: DC | PRN

## 2016-06-16 MED ORDER — LORAZEPAM 2 MG/ML IJ SOLN
1.0000 mg | INTRAMUSCULAR | Status: DC | PRN
  Administered 2016-06-16 – 2016-06-17 (×2): 1 mg via INTRAVENOUS
  Filled 2016-06-16 (×2): qty 1

## 2016-06-16 MED ORDER — GLYCOPYRROLATE 0.2 MG/ML IJ SOLN: 0.2000 mg | INTRAMUSCULAR | Status: DC | PRN

## 2016-06-16 MED ORDER — DEXTROSE-NACL 5-0.45 % IV SOLN: INTRAVENOUS | Status: DC

## 2016-06-16 MED ORDER — GLYCOPYRROLATE 1 MG PO TABS
1.0000 mg | ORAL_TABLET | ORAL | Status: DC | PRN
  Filled 2016-06-16: qty 1

## 2016-06-16 MED ORDER — ONDANSETRON HCL 4 MG/2ML IJ SOLN: 4.0000 mg | Freq: Four times a day (QID) | INTRAMUSCULAR | Status: DC | PRN

## 2016-06-16 MED ORDER — ACETAMINOPHEN 325 MG PO TABS: 650.0000 mg | ORAL_TABLET | Freq: Four times a day (QID) | ORAL | Status: DC | PRN

## 2016-06-16 MED ORDER — LORAZEPAM 1 MG PO TABS: 1.0000 mg | ORAL_TABLET | ORAL | Status: DC | PRN

## 2016-06-16 NOTE — Progress Notes (Signed)
Report received from Judson Roch, [place] for admission to [phone]

## 2016-06-16 NOTE — ED Notes (Signed)
Pt rolled to check for BM per family request. She is clean and dry. Repositioned and given pillow.

## 2016-06-16 NOTE — Progress Notes (Signed)
Chaplain responded to request from RN that I offer support to family of hospice pt. Pt's two daughters were at bedside. They stated pt has been under hospice care at home but was in distress yesterday and hospice wanted her brought to [facility]. Daughters would like pt to stay at [facility] until she passes rather than be moved again. I spoke words of comfort to pt and prayed with pt and her daughters. I was told that a son is on his way from a couple of hours ago.

## 2016-06-16 NOTE — Consult Note (Signed)
[place]: In to visit with family. Daughter and son present. They are appropriately grieving. Discussion held regarding goals and possibly now that pt has been given such a short prognosis of 24-48 hours would they like to not admit pt to the hospital and have her transfer to [facility] where we would continue to give treatment to her as they are in hospital. They express that there is a sister who is a nurse and that she would prefer the pt not be moved with such a short prognosis due to the possibility that she may pass in the ambulance and they want a family member to be present when she does pass. The pt had an outpt procedure for a tap today at 100pm. This has been canceled. The family is in agreement with not pursuing any of this with the pt having such a short prognosis. They want her to have comfort care only. WE will continue to follow and support family through this time. We did discuss that we could re-evaluate moving pt to [facility] if  she did show some changes and it looked as if she would be with [place] longer. Webb Silversmith RN  [phone] [facility].

## 2016-06-16 NOTE — ED Triage Notes (Signed)
Pt brought to ED by [facility] from home for respiratory distress and agonal breathing, per family pt is hospice care comfort care, DNR orders on place, pt arrive with a GCS of 3 to ED on a NRM with very labored breathing. Per family pt is been with agonal breathing for on hour  bp 145/50, HR 110.

## 2016-06-16 NOTE — ED Provider Notes (Signed)
[facility] DEPT Provider Note   CSN: [id number] Arrival date & time: 06/17/2016  0550     History   Chief Complaint Chief Complaint  Patient presents with  . Respiratory Distress    HPI Monique Fleming is a 82 y.o. female.  HPI  LEVEL 5 CAVEAT FOR CONFUSION Pt comes in with cc of respiratory distress. Pt has hx of colon CA, liver failure. COPD, CHF, DM. Pt is DNR/DNI AND under Hospice care. Per EMS, pt started having respiratory distress and so EMS was called. Daughter at bedside soon after. Reports that her mother has had intermittent dib and the thought was that her dib was due to ascites. Pt is supposed to have an elective paracentesis today, and they called the Hospice team for direction, and it was suggested that pt go to the ER to get comfortable and possibly get paracentesis. Pt had received several ativan, and even dilaudid prior to coming to the ER along with breathing treatments. Pt has a GCS of 3.    Past Medical History:  Diagnosis Date  . Abnormal liver function test   . Adenocarcinoma ([place]) 2008   colon,s/p right hemicolectomy  . Anemia    iron deficiency  . Angina   . Anxiety    Dr Casimiro Needle  . Aortic stenosis    moderate by echo 6/11 gradient 20/46mmHg for mean and peak  . Asthma   . Atherosclerosis   . Bursitis    spine per pt  . Cellulitis   . Chronic bronchitis   . Cirrhosis of liver (HCC)    hx of  . Clostridium difficile diarrhea   . COPD (chronic obstructive pulmonary disease) ([place])   . Depression   . Diabetes mellitus    type II  . Diverticular disease   . Elevated BP 07/09/2015  . Encephalopathy, metabolic   . Epistaxis 04/14/2016  . GERD (gastroesophageal reflux disease)   . IBS (irritable bowel syndrome)   . Infectious diarrhea(009.2)   . Insomnia    chronic  . Internal hemorrhoids   . Macular degeneration 02/18/2014   Follows with [facility]  . Peripheral vascular disease ([place])   . Protein calorie malnutrition ([place])   . Renal  cyst   . Seizures (HCC)    hx of one seizure "  . Sinusitis   . Spinal stenosis 03/29/2016  . Thrombocytopenia ([place]) 04/01/2013  . Thrush   . TIA (transient ischemic attack)    Dr. [name]  . Type 2 diabetes mellitus with vascular disease ([place]) 03/02/2007   Chronic      Patient Active Problem List   Diagnosis Date Noted  . Acute exacerbation of chronic obstructive pulmonary disease (COPD) ([place]) 06/29/2016  . Abdominal distension   . Palliative care encounter   . Confusion   . Diarrhea of presumed infectious origin   . Acute GI bleeding 05/27/2016  . Epistaxis 04/14/2016  . Spinal stenosis 03/29/2016  . Hepatic cirrhosis ([place]) 03/10/2016  . Encephalopathy, hepatic ([place]) 02/28/2016  . Lactic acidosis 02/28/2016  . Acute CHF (congestive heart failure) ([place]) 02/28/2016  . Dementia 02/13/2016  . Gait instability   . Depression with anxiety   . Seizures ([place])   . Abnormal EEG   . Normocytic anemia   . Acute encephalopathy 01/18/2016  . Speech disturbance 01/18/2016  . Prolonged Q-T interval on ECG 01/18/2016  . Right shoulder pain 11/07/2015  . Elevated BP 07/09/2015  . Cold sore 04/25/2015  . Chronic respiratory failure with hypoxia (HCC)  04/25/2015  . Rib pain on left side 02/21/2015  . Pedal edema 02/07/2015  . Pain in joint, ankle and foot 02/07/2015  . Diabetes mellitus, type 2 ([place]) 12/21/2014  . Chest pain, mid sternal   . General weakness 08/22/2014  . Dehydration 08/22/2014  . Acute diarrhea 08/22/2014  . Hemorrhoids 08/22/2014  . Rectal bleeding 07/04/2014  . Nonspecific (abnormal) findings on radiological and other examination of gastrointestinal tract 07/04/2014  . Iron deficiency anemia  06/27/2014  . Mild cognitive disorder 04/03/2014  . COPD (chronic obstructive pulmonary disease) with chronic bronchitis Gold B 03/14/2014  . Depression, major, recurrent ([place]) 03/06/2014  . Macular degeneration 02/18/2014  . Anxiety state 01/08/2014  . Carotid disease,  bilateral ([place]) 01/02/2014  . Burning with urination 12/17/2013  . Aortic stenosis 12/17/2013  . Wounds, multiple 06/28/2013  . Thrombocytopenia ([place]) 04/01/2013  . Cerebral artery occlusion ([place]) 03/11/2013  . Cardiac disease 03/11/2013  . Nasal septal perforation 07/20/2012  . Hyperlipidemia 10/20/2011  . Reaction to Pneumovax immunization 03/07/2011  . ACTINIC KERATOSIS 08/22/2010  . NEOPLASM OF UNCERTAIN BEHAVIOR OF SKIN 05/28/2010  . Angiodysplasia of intestine 02/25/2010  . ANEMIA-B12 DEFICIENCY 12/30/2009  . Megaloblastic anemia due to B12 deficiency 12/30/2009  . Iron deficiency anemia 12/23/2009  . BREAST PAIN 10/09/2009  . CIRRHOSIS 03/08/2009  . DIVERTICULOSIS-COLON 02/28/2009  . Diarrhea 02/28/2009  . History of malignant neoplasm of large intestine 02/28/2009  . PERSONAL HX COLONIC POLYPS 02/28/2009  . UNSPECIFIED BREAST DISORDER 02/13/2009  . 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 NECK PAIN 09/10/2008  . COPD with chronic bronchitis ([place]) 05/29/2008  . Malaise and fatigue 03/26/2008  . GERD 03/07/2008  . Irritable bowel syndrome 12/07/2007  . TOBACCO ABUSE, Hx of 10/05/2007  . INSOMNIA, CHRONIC 10/05/2007  . ATHEROSCLEROSIS 10/05/2007  . SLEEP RELATED MOVEMENT DISORDER UNSPECIFIED 10/05/2007  . Type 2 diabetes mellitus with vascular disease ([place]) 03/02/2007  . ANXIETY 03/02/2007  . PERIPHERAL VASCULAR DISEASE 03/02/2007  . RENAL CYST, LEFT 03/02/2007  . TRANSIENT ISCHEMIC ATTACK, HX OF 03/02/2007    Past Surgical History:  Procedure Laterality Date  . APPENDECTOMY    . CAROTID ENDARTERECTOMY    . CHOLECYSTECTOMY  11/09  . COLECTOMY     right hemi  . TONSILLECTOMY    . UPPER GASTROINTESTINAL ENDOSCOPY      OB History    No data available       Home Medications    Prior to Admission medications   Medication Sig Start Date End Date Taking? Authorizing Provider  albuterol (PROAIR HFA) 108 (90 BASE) MCG/ACT inhaler Inhale 2 puffs into  the lungs every 4 (four) hours as needed for wheezing or shortness of breath. 11/12/14   Mosie Lukes, MD  antiseptic oral rinse (BIOTENE) LIQD 15 mLs by Mouth Rinse route as needed for dry mouth. 06/04/16   Belkys A Regalado, MD  cholestyramine (QUESTRAN) 4 g packet MIX 1 PACKET (4 GRAM TOTAL) IN LIQUID AND DRINK DAILY AS NEEDED FOR DIARRHEA 05/18/16   Mosie Lukes, MD  clonazePAM (KLONOPIN) 1 MG tablet 1 tab po qhs and 1 tab po bid prn anxiety/sob 06/12/16   Mosie Lukes, MD  desvenlafaxine (PRISTIQ) 100 MG 24 hr tablet Take 100 mg by mouth daily.    Historical Provider, MD  fluticasone (FLONASE) 50 MCG/ACT nasal spray USE 2 SPRAYS IN EACH NOSTRIL DAILY AS NEEDED FOR ALLERGIES 10/18/15   Mosie Lukes, MD  fluticasone (FLOVENT HFA) 110 MCG/ACT inhaler Inhale 2  puffs into the lungs 2 (two) times daily. Patient taking differently: Inhale 2 puffs into the lungs 2 (two) times daily as needed (shortness of breath).  05/01/16   Percell Miller Saguier, PA-C  formoterol (PERFOROMIST) 20 MCG/2ML nebulizer solution Take 2 mLs (20 mcg total) by nebulization 2 (two) times daily. 01/14/15   Elsie Stain, MD  glucose blood (BAYER CONTOUR NEXT TEST) test strip Use as directed once daily to check blood sugar.  DX E11.9 02/18/16   Mosie Lukes, MD  lactulose (CHRONULAC) 10 GM/15ML solution Take 30 mLs (20 g total) by mouth daily. 06/04/16   Belkys A Regalado, MD  lactulose (CHRONULAC) 10 GM/15ML solution Take 30 mLs (20 g total) by mouth daily as needed for mild constipation (if patient doesnt have 4 BM in 24 hours.). 06/04/16   Belkys A Regalado, MD  Magnesium 400 MG TABS Take 400 mg by mouth 2 (two) times daily. Patient taking differently: Take 500-1,000 mg by mouth 2 (two) times daily. Alternating between 1 day take 500mg  twice daily, the next day take 1000mg  in the morning and 500mg  in the evening. 03/02/16   Hosie Poisson, MD  metFORMIN (GLUCOPHAGE) 500 MG tablet TAKE ONE-HALF (1/2) TO ONE TABLET DAILY WITH  BREAKFAST Patient taking differently: TAKE 1 TABLET EVERY MORNING. 03/15/16   Mosie Lukes, MD  montelukast (SINGULAIR) 10 MG tablet Take 1 tablet (10 mg total) by mouth at bedtime. 03/23/16   Tammy S Parrett, NP  Multiple Vitamins-Minerals (OCUVITE PRESERVISION) TABS Take 1 tablet by mouth 2 (two) times daily.     Historical Provider, MD  Nutritional Supplements (CARNATION INSTANT BREAKFAST PO) Take 1 each by mouth 3 (three) times daily.    Historical Provider, MD  ondansetron (ZOFRAN) 4 MG tablet Take 1 tablet (4 mg total) by mouth every 6 (six) hours as needed for nausea or vomiting. 03/14/15   Lori P Hvozdovic, PA-C  phenytoin (DILANTIN) 100 MG ER capsule Take 2 capsules (200 mg total) by mouth at bedtime. Reported on 02/13/2016 04/16/16   [name], MD  potassium chloride (K-DUR) 10 MEQ tablet Take 1 tablet (10 mEq total) by mouth 2 (two) times daily. 03/03/16   Mosie Lukes, MD  ranitidine (ZANTAC) 300 MG tablet Take 1 tablet daily. 03/05/16   Jessica D Zehr, PA-C  saccharomyces boulardii (FLORASTOR) 250 MG capsule Take 250 mg by mouth 2 (two) times daily.     Historical Provider, MD  sodium chloride (OCEAN) 0.65 % SOLN nasal spray Place 2 sprays into both nostrils daily as needed for congestion.     Historical Provider, MD  spironolactone (ALDACTONE) 25 MG tablet Take 1 tablet (25 mg total) by mouth daily. 06/12/16   Mosie Lukes, MD  traMADol (ULTRAM) 50 MG tablet Take 2 tablets (100 mg total) by mouth every 12 (twelve) hours as needed for severe pain. 04/14/16   Mosie Lukes, MD  vancomycin (VANCOCIN) 50 mg/mL oral solution Take 5 mLs (250 mg total) by mouth every 6 (six) hours. 06/04/16   Elmarie Shiley, MD    Family History Family History  Problem Relation Age of Onset  . Heart disease Mother   . Hypertension Father   . Colon cancer Paternal Grandmother   . Esophageal cancer Brother   . Diabetes Brother   . Rectal cancer Neg Hx   . Stomach cancer Neg Hx     Social  History Social History  Substance Use Topics  . Smoking status: Former Smoker  Packs/day: 0.50    Years: 70.00    Types: Cigarettes    Start date: 02/14/1949    Quit date: 01/25/2016  . Smokeless tobacco: Never Used     Comment: 01/2016 Quit  . Alcohol use No     Allergies   Bactrim [sulfamethoxazole-trimethoprim]; Levofloxacin; Pneumovax [pneumococcal polysaccharide vaccine]; Amoxicillin-pot clavulanate; Penicillins; Bupropion; Haldol [haloperidol lactate]; Morphine and related; and Sudafed [pseudoephedrine hcl]   Review of Systems Review of Systems  Unable to perform ROS: Severe respiratory distress     Physical Exam Updated Vital Signs BP (!) 98/38   Pulse 82   Temp 98.7 F (37.1 C) (Oral)   Resp 17   Ht 5\' 4"  (1.626 m)   Wt 134 lb (60.8 kg)   SpO2 100%   BMI 23.00 kg/m   Physical Exam  Constitutional: She appears well-developed.  HENT:  Head: Normocephalic and atraumatic.  Eyes: Pupils are equal, round, and reactive to light.  Left pupil is 4 mm and R pupil is 2 mm  Neck: Neck supple.  Cardiovascular:  tachycardia  Pulmonary/Chest: She is in respiratory distress. She has wheezes. She has rales.  Abdominal: Soft. She exhibits distension.  Neurological:  GCS - 3  Skin: Skin is warm and dry.  Nursing note and vitals reviewed.    ED Treatments / Results  Labs (all labs ordered are listed, but only abnormal results are displayed) Labs Reviewed - No data to display  EKG  EKG Interpretation None       Radiology No results found.  Procedures .Critical Care Performed by: Varney Biles Authorized by: Varney Biles   Critical care provider statement:    Critical care time (minutes):  40   Critical care time was exclusive of:  Separately billable procedures and treating other patients   Critical care was necessary to treat or prevent imminent or life-threatening deterioration of the following conditions:  Respiratory failure and metabolic  crisis   Critical care was time spent personally by me on the following activities:  Development of treatment plan with patient or surrogate, discussions with consultants, evaluation of patient's response to treatment, examination of patient, obtaining history from patient or surrogate and review of old charts   Ultrasound limited abdominal and limited transthoracic ultrasound (FAST)  Indication: respiratory distress, ascites Four views were obtained using the low frequency transducer: Splenorenal, Hepatorenal, Retrovesical, Pericardial subxyphoid Interpretation: moderate free fluid visualized surrounding the kidneys, pelvis, liver. No fluid around the pericardium. Images archived electronically Dr. Kathrynn Humble personally performed and interpreted the images    (including critical care time)  Medications Ordered in ED Medications  fentaNYL (SUBLIMAZE) 2,500 mcg in sodium chloride 0.9 % 250 mL (10 mcg/mL) infusion (25 mcg/hr Intravenous New Bag/Given 06/14/2016 0718)  LORazepam (ATIVAN) injection 1 mg (1 mg Intravenous Given 06/09/2016 0619)  HYDROmorphone (DILAUDID) injection 1 mg (1 mg Intravenous Given 06/15/2016 [id number])     Initial Impression / Assessment and Plan / ED Course  I have reviewed the triage vital signs and the nursing notes.  Pertinent labs & imaging results that were available during my care of the patient were reviewed by me and considered in my medical decision making (see chart for details).  Clinical Course   Pt comes in with cc of respiratory distress. Has multiple medical comorbidities, and she is under Hospice care. GOALS OF CARE ESTABLISHED WITH THE DAUGHTER AS SOON AS SHE ARRIVED. I did a bedside abd [place], and we noticed visible ascites - but clearly not enough to be  causing acute respiratory distress. We also notices some wheezing. I explained to the family that pt's acute respiratory distress and confusion are likely multifactorial, and that there is likely contribution  from her existing lung disease, cardiac disease and liver disease. PT has unequal pupil and hx of hemorrhage, so we discussed even remote possibllity of brain bleed.  We established that patient's daughter really want her to be comfortable. If the paracenthesis is not going to be therapeutic, they dont want it to be done emergently. We discussed confusion and bipap vs. Nebs/NRB - and family wants the most comfortable option, and so we will go with the NRB for now.  Consulted palliative care, Dr. Hilma Favors will see the patient. Medicine to admit.   Final Clinical Impressions(s) / ED Diagnoses   Final diagnoses:  COPD exacerbation ([place])  Acute respiratory failure with hypercapnia ([place])    New Prescriptions New Prescriptions   No medications on file     Varney Biles, MD 06/07/2016 [phone]

## 2016-06-16 NOTE — ED Notes (Signed)
Hospice RN at bedside with patient and family.

## 2016-06-16 NOTE — ED Notes (Signed)
Pt grimacing, moaning. Family at bedside. Fentanyl drip increased per titration orders. Family requesting to leave cardiac monitoring in place, as comfort care was offered. Waiting for bed assignment.

## 2016-06-16 NOTE — ED Notes (Signed)
Pt appears restful. Has daughter at bedside. On NRB. Does not appear in any distress. RR 18, unlabored.

## 2016-06-16 NOTE — H&P (Signed)
History and Physical    Monique Fleming [id number] DOB: [birth year] DOA: 06/13/2016   PCP: Penni Homans, MD   Patient coming from/Resides with: Private residence  Admission status: Inpatient/medically necessary to stay a minimum 2 midnights secondary to need to initiate end-of-life care with continuous infusion of narcotics and utilization of prn anxiolytics t another symptom management medication  Chief Complaint: Altered mental status and shortness of breath  HPI: Monique Fleming is a 82 y.o. female with medical history significant for known cirrhosis with portal hypertension, recent admission for acute GI bleeding secondary to portal gastropathy and colonic AVMs, recent diffuse pancolitis with negative stool studies (although index of suspicion for C. difficile lattice remained high at time of discharge and therefore was treated as such), hepatic encephalopathy, recent acute left cerebellar infarct, COPD, diabetes, and prior colon cancer with colectomy in 2008. Patient was discharged with hospice home care with plans for future treatments to be focused on comfort. Family had noticed since discharge patient with gradual increase in abdominal girth concerning for ascites so GI had plan for outpatient therapeutic paracentesis today. About 3 days ago the patient was noticed to have a.m. episodes of what the family described as "gasping for air". She was treated with increasing her oxygen to 4 L and given pain medications and antianxiety medications and she "settle down". These episodes became more frequent and now were occurring in the evening. She had an episode last night at around 8 PM that improved with utilization of Ativan and nebulizer. The patient went to bed shortly thereafter. At midnight while the patient was still asleep the daughter gave a nebulizer treatment to help prevent occurrence of symptoms. The daughter awakened during the middle the night (she was sleeping in the chair in the  patient's room) noticed the patient having difficulty breathing and she had slid out of the bed onto the floor. The patient was subsequently brought to the hospital for further evaluation and treatment. Family did confirm that they wish for comfort focus only including no further therapeutic paracentesis.  ED Course:  Vital Signs: BP (!) 98/37   Pulse 80   Temp 98.7 F (37.1 C) (Oral)   Resp 15   Ht 5\' 4"  (1.626 m)   Wt 60.8 kg (134 lb)   SpO2 100%   BMI 23.00 kg/m  Lab data: Sodium 137, potassium 3.7, chloride 109, CO2 24, BUN 11, creatinine 0.1, glucose 149, anion gap 4 Medications and treatments: Ativan 1 mg IV 1, Dilaudid 1 mg IV 1, fentanyl infusion starting at 25 g per hour with orders to titrate up based on pain and other comfort issues  Review of Systems:  **Patient unable to answer questions given altered mentation but according to the family the only new symptoms are increasing abdominal swelling, apparent increase in abdominal pain and respiratory issues as described above   Past Medical History:  Diagnosis Date  . Abnormal liver function test   . Adenocarcinoma ([place]) 2008   colon,s/p right hemicolectomy  . Anemia    iron deficiency  . Angina   . Anxiety    Dr Casimiro Needle  . Aortic stenosis    moderate by echo 6/11 gradient 20/65mmHg for mean and peak  . Asthma   . Atherosclerosis   . Bursitis    spine per pt  . Cellulitis   . Chronic bronchitis   . Cirrhosis of liver (HCC)    hx of  . Clostridium difficile diarrhea   . COPD (chronic obstructive  pulmonary disease) ([place])   . Depression   . Diabetes mellitus    type II  . Diverticular disease   . Elevated BP 07/09/2015  . Encephalopathy, metabolic   . Epistaxis 04/14/2016  . GERD (gastroesophageal reflux disease)   . IBS (irritable bowel syndrome)   . Infectious diarrhea(009.2)   . Insomnia    chronic  . Internal hemorrhoids   . Macular degeneration 02/18/2014   [facility] with [facility]  . Peripheral  vascular disease ([place])   . Protein calorie malnutrition ([place])   . Renal cyst   . Seizures (HCC)    hx of one seizure "  . Sinusitis   . Spinal stenosis 03/29/2016  . Thrombocytopenia ([place]) 04/01/2013  . Thrush   . TIA (transient ischemic attack)    Dr. [name]  . Type 2 diabetes mellitus with vascular disease ([place]) 03/02/2007   Chronic      Past Surgical History:  Procedure Laterality Date  . APPENDECTOMY    . CAROTID ENDARTERECTOMY    . CHOLECYSTECTOMY  11/09  . COLECTOMY     right hemi  . TONSILLECTOMY    . UPPER GASTROINTESTINAL ENDOSCOPY      Social History   Social History  . Marital status: Widowed    Spouse name: N/A  . Number of children: 3  . Years of education: N/A   Occupational History  . retired   .  Retired   Social History Main Topics  . Smoking status: Former Smoker    Packs/day: 0.50    Years: 70.00    Types: Cigarettes    Start date: 02/14/1951    Quit date: 01/25/2016  . Smokeless tobacco: Never Used     Comment: 01/2016 Quit  . Alcohol use No  . Drug use: No  . Sexual activity: No   Other Topics Concern  . Not on file   Social History Narrative   Patient signed a Designated Party Release to allow her daughter, Sura Islas, to have access to her medical records/information. Entered by Fleet Contras March 24,2011 @ 2:47 pm   Dailly Caffeine Use    Mobility: Not applicable Work history: Not applicable   Allergies  Allergen Reactions  . Bactrim [Sulfamethoxazole-Trimethoprim] Shortness Of Breath  . Levofloxacin Other (See Comments)    seizure  . Pneumovax [Pneumococcal Polysaccharide Vaccine] Other (See Comments)    Severe allergy-cellulitis  . Amoxicillin-Pot Clavulanate Other (See Comments)    Patient stated Drs. recommend taking Augmentin due to her PCN allergy.  . Penicillins Rash    Has patient had a PCN reaction causing immediate rash, facial/tongue/throat swelling, SOB or lightheadedness with hypotension: No Has patient had a  PCN reaction causing severe rash involving mucus membranes or skin necrosis: No Has patient had a PCN reaction that required hospitalization No Has patient had a PCN reaction occurring within the last 10 years: No If all of the above answers are "NO", then may proceed with Cephalosporin use.   . Bupropion Other (See Comments)    Hallucinating   . Haldol [Haloperidol Lactate]     Had unknown reaction many yrs ago per pt  . Morphine And Related     Confused and agitation  . Sudafed [Pseudoephedrine Hcl] Other (See Comments)    seizure    Family History  Problem Relation Age of Onset  . Heart disease Mother   . Hypertension Father   . Colon cancer Paternal Grandmother   . Esophageal cancer Brother   . Diabetes  Brother   . Rectal cancer Neg Hx   . Stomach cancer Neg Hx      Prior to Admission medications   Medication Sig Start Date End Date Taking? Authorizing Provider  albuterol (PROAIR HFA) 108 (90 BASE) MCG/ACT inhaler Inhale 2 puffs into the lungs every 4 (four) hours as needed for wheezing or shortness of breath. 11/12/14   Mosie Lukes, MD  antiseptic oral rinse (BIOTENE) LIQD 15 mLs by Mouth Rinse route as needed for dry mouth. 06/04/16   Belkys A Regalado, MD  cholestyramine (QUESTRAN) 4 g packet MIX 1 PACKET (4 GRAM TOTAL) IN LIQUID AND DRINK DAILY AS NEEDED FOR DIARRHEA 05/18/16   Mosie Lukes, MD  clonazePAM (KLONOPIN) 1 MG tablet 1 tab po qhs and 1 tab po bid prn anxiety/sob 06/12/16   Mosie Lukes, MD  desvenlafaxine (PRISTIQ) 100 MG 24 hr tablet Take 100 mg by mouth daily.    Historical Provider, MD  fluticasone (FLONASE) 50 MCG/ACT nasal spray USE 2 SPRAYS IN EACH NOSTRIL DAILY AS NEEDED FOR ALLERGIES 10/18/15   Mosie Lukes, MD  fluticasone (FLOVENT HFA) 110 MCG/ACT inhaler Inhale 2 puffs into the lungs 2 (two) times daily. Patient taking differently: Inhale 2 puffs into the lungs 2 (two) times daily as needed (shortness of breath).  05/01/16   Percell Miller Saguier, PA-C    formoterol (PERFOROMIST) 20 MCG/2ML nebulizer solution Take 2 mLs (20 mcg total) by nebulization 2 (two) times daily. 01/14/15   Elsie Stain, MD  glucose blood (BAYER CONTOUR NEXT TEST) test strip Use as directed once daily to check blood sugar.  DX E11.9 02/18/16   Mosie Lukes, MD  lactulose (CHRONULAC) 10 GM/15ML solution Take 30 mLs (20 g total) by mouth daily. 06/04/16   Belkys A Regalado, MD  lactulose (CHRONULAC) 10 GM/15ML solution Take 30 mLs (20 g total) by mouth daily as needed for mild constipation (if patient doesnt have 4 BM in 24 hours.). 06/04/16   Belkys A Regalado, MD  Magnesium 400 MG TABS Take 400 mg by mouth 2 (two) times daily. Patient taking differently: Take 500-1,000 mg by mouth 2 (two) times daily. Alternating between 1 day take 500mg  twice daily, the next day take 1000mg  in the morning and 500mg  in the evening. 03/02/16   Hosie Poisson, MD  metFORMIN (GLUCOPHAGE) 500 MG tablet TAKE ONE-HALF (1/2) TO ONE TABLET DAILY WITH BREAKFAST Patient taking differently: TAKE 1 TABLET EVERY MORNING. 03/15/16   Mosie Lukes, MD  montelukast (SINGULAIR) 10 MG tablet Take 1 tablet (10 mg total) by mouth at bedtime. 03/23/16   Tammy S Parrett, NP  Multiple Vitamins-Minerals (OCUVITE PRESERVISION) TABS Take 1 tablet by mouth 2 (two) times daily.     Historical Provider, MD  Nutritional Supplements (CARNATION INSTANT BREAKFAST PO) Take 1 each by mouth 3 (three) times daily.    Historical Provider, MD  ondansetron (ZOFRAN) 4 MG tablet Take 1 tablet (4 mg total) by mouth every 6 (six) hours as needed for nausea or vomiting. 03/14/15   Lori P Hvozdovic, PA-C  phenytoin (DILANTIN) 100 MG ER capsule Take 2 capsules (200 mg total) by mouth at bedtime. Reported on 02/13/2016 04/16/16   [name], MD  potassium chloride (K-DUR) 10 MEQ tablet Take 1 tablet (10 mEq total) by mouth 2 (two) times daily. 03/03/16   Mosie Lukes, MD  ranitidine (ZANTAC) 300 MG tablet Take 1 tablet daily. 03/05/16    Laban Emperor Zehr, PA-C  saccharomyces  boulardii (FLORASTOR) 250 MG capsule Take 250 mg by mouth 2 (two) times daily.     Historical Provider, MD  sodium chloride (OCEAN) 0.65 % SOLN nasal spray Place 2 sprays into both nostrils daily as needed for congestion.     Historical Provider, MD  spironolactone (ALDACTONE) 25 MG tablet Take 1 tablet (25 mg total) by mouth daily. 06/12/16   Mosie Lukes, MD  traMADol (ULTRAM) 50 MG tablet Take 2 tablets (100 mg total) by mouth every 12 (twelve) hours as needed for severe pain. 04/14/16   Mosie Lukes, MD  vancomycin (VANCOCIN) 50 mg/mL oral solution Take 5 mLs (250 mg total) by mouth every 6 (six) hours. 06/04/16   Elmarie Shiley, MD    Physical Exam: Vitals:   06/26/2016 0615 06/13/2016 0630 06/14/2016 0830 06/29/2016 0930  BP: 141/68 (!) 101/47 (!) 98/38 (!) 98/37  Pulse: 110 102 82 80  Resp: (!) 36 20 17 15   Temp:      TempSrc:      SpO2: 100% 100% 100% 100%  Weight:      Height:          Constitutional: NAD, calm, Although appear to be somewhat uncomfortable on palpation of abdomen Eyes: Pupils are unequal with left pupil being 3.5 mm and nonreactive, right pupil being 1 mm and reactive, lids and conjunctivae normal ENMT: Mucous membranes are moist. Posterior pharynx clear of any exudate or lesions.Normal dentition.  Neck: normal, supple, no masses, no thyromegaly Respiratory: clear to auscultation bilaterally, no wheezing, no crackles. Normal respiratory effort. No accessory muscle use.  Cardiovascular: Regular rate and rhythm, no murmurs / rubs / gallops. No extremity edema. 2+ pedal pulses. No carotid bruits.  Abdomen: Distended and diffusely tender with minimal palpation, no masses palpated. Bowel sounds absent.  Musculoskeletal: no clubbing / cyanosis. No joint deformity upper and lower extremities. Good ROM, no contractures. Normal muscle tone.  Skin: no rashes, lesions, ulcers. No induration Neurologic: CN 2-12 appear to be grossly intact  For unequal pupils as documented above. Sensation intact, DTRs/strength not assessed.  Psychiatric: Unable to assess secondary to patient's underlying altered mentation    Labs on Admission: I have personally reviewed following labs and imaging studies  CBC: No results for input(s): WBC, NEUTROABS, HGB, HCT, MCV, PLT in the last 168 hours. Basic Metabolic Panel: No results for input(s): NA, K, CL, CO2, GLUCOSE, BUN, CREATININE, CALCIUM, MG, PHOS in the last 168 hours. GFR: Estimated Creatinine Clearance: 44.6 mL/min (by C-G formula based on SCr of 0.81 mg/dL). Liver Function Tests: No results for input(s): AST, ALT, ALKPHOS, BILITOT, PROT, ALBUMIN in the last 168 hours. No results for input(s): LIPASE, AMYLASE in the last 168 hours. No results for input(s): AMMONIA in the last 168 hours. Coagulation Profile: No results for input(s): INR, PROTIME in the last 168 hours. Cardiac Enzymes: No results for input(s): CKTOTAL, CKMB, CKMBINDEX, TROPONINI in the last 168 hours. BNP (last 3 results) No results for input(s): PROBNP in the last 8760 hours. HbA1C: No results for input(s): HGBA1C in the last 72 hours. CBG: No results for input(s): GLUCAP in the last 168 hours. Lipid Profile: No results for input(s): CHOL, HDL, LDLCALC, TRIG, CHOLHDL, LDLDIRECT in the last 72 hours. Thyroid Function Tests: No results for input(s): TSH, T4TOTAL, FREET4, T3FREE, THYROIDAB in the last 72 hours. Anemia Panel: No results for input(s): VITAMINB12, FOLATE, FERRITIN, TIBC, IRON, RETICCTPCT in the last 72 hours. Urine analysis:    Component Value Date/Time  COLORURINE YELLOW 05/26/2016 2245   APPEARANCEUR CLEAR 05/26/2016 2245   LABSPEC 1.024 05/26/2016 2245   PHURINE 5.5 05/26/2016 2245   GLUCOSEU NEGATIVE 05/26/2016 2245   GLUCOSEU NEGATIVE 08/13/2011 0846   HGBUR NEGATIVE 05/26/2016 2245   HGBUR negative 02/04/2009 1309   BILIRUBINUR NEGATIVE 05/26/2016 2245   BILIRUBINUR neg 05/01/2016 1110    KETONESUR NEGATIVE 05/26/2016 2245   PROTEINUR NEGATIVE 05/26/2016 2245   UROBILINOGEN 0.2 05/01/2016 1110   UROBILINOGEN 1.0 05/25/2015 1757   NITRITE NEGATIVE 05/26/2016 2245   LEUKOCYTESUR NEGATIVE 05/26/2016 2245   Sepsis Labs: @LABRCNTIP (procalcitonin:4,lacticidven:4) )No results found for this or any previous visit (from the past 240 hour(s)).   Radiological Exams on Admission: No results found.   Assessment/Plan Active Problems:   Chronic respiratory failure with hypoxia/Hepatic cirrhosis/End of life care -Current focus is on comfort for patient and symptom management as well supportive care for the family -Palliative medicine team has been notified by EDP and Dr. Hilma Favors will see patient later today -Continue fentanyl infusion -Ativan for anxiolytic -other symptom management medications as per end-of-life order set -Anticipate hospital death within the next 24-48 hours     DVT prophylaxis: End-of-life Code Status: DO NOT RESUSCITATE Family Communication: Both daughters, Fadwa Dionicio and Darien Ramus at the bedside Disposition Plan: Anticipate hospital death Consults called: Palliative medicine/Dr. Hilma Favors vs Hospice since previously established    Meoshia Billing L. ANP-BC [facility] Pager [phone]   If 7PM-7AM, please contact night-coverage [email or web address] Password [facility]  06/21/2016, 9:46 AM

## 2016-06-16 NOTE — ED Notes (Signed)
Per family request. Pt moving, grimacing.Fentanyl increased per titration orders.

## 2016-06-16 NOTE — Progress Notes (Signed)
Monique Fleming is a 82 y.o. female patient admitted from ED awake, alert - oriented  X 4 - no acute distress noted.  VSS - Blood pressure (!) 84/37, pulse 76, temperature 98.7 F (37.1 C), temperature source Oral, resp. rate 12, height 5\' 4"  (1.626 m), weight 60.8 kg (134 lb), SpO2 100 %.    IV in place, occlusive dsg intact without redness.  Family orientated to room. Patient declined safety video at this time.  Admission INP armband ID verified with patient/family, and in place.   SR up x 2, fall assessment complete, with patient and family able to verbalize understanding of risk associated with falls, and verbalized understanding to call nsg before up out of bed.  Call light within reach, patient able to voice, and demonstrate understanding.     Comfort Care ordered from Service Response for family. Family at bedside supportive and appropriate for situation.      Will cont to eval and treat per MD orders.  Janalyn Shy, RN 06/21/2016 2:13 PM

## 2016-06-16 NOTE — ED Notes (Signed)
Spoke with Dr. Marily Memos about family concerns of needing more medications for comfort. PRN ativan released from signed & held. Will continue to monitor. Also, advised pt at max rate of fentanyl and give it time to work per admitting. Offered family to get hospital bed, but refused at this time.

## 2016-06-16 NOTE — ED Notes (Signed)
Pt appears more restful. Family at bedside. Unlabored respirations. Waiting for bed assignment.

## 2016-06-16 NOTE — Progress Notes (Signed)
Palliative Medicine RN Note:  Palliative Care Consult order noted. This is an active, covered hospice admission for [facility]. Discussed patient with hospice liason. They will notify us if they need assistance from PMT; however, they do not require PMT support at this time.   Marjie Skiff Britni Driscoll, RN, BSN, [facility] 06/25/2016 2:43 PM Cell [phone] 8:00-4:00 Monday-Friday Office [phone]

## 2016-06-17 ENCOUNTER — Telehealth: Payer: Self-pay

## 2016-06-17 ENCOUNTER — Encounter: Payer: Self-pay | Admitting: Family Medicine

## 2016-06-17 DIAGNOSIS — J9611 Chronic respiratory failure with hypoxia: Secondary | ICD-10-CM

## 2016-06-17 MED ORDER — LEVALBUTEROL HCL 1.25 MG/0.5ML IN NEBU
1.2500 mg | INHALATION_SOLUTION | Freq: Once | RESPIRATORY_TRACT | Status: DC
  Filled 2016-06-17: qty 0.5

## 2016-06-17 MED ORDER — SODIUM CHLORIDE 0.9 % IV SOLN
1.0000 mg/h | INTRAVENOUS | Status: DC
  Administered 2016-06-17: 1 mg/h via INTRAVENOUS
  Administered 2016-06-18: 2 mg/h via INTRAVENOUS
  Filled 2016-06-17 (×2): qty 10

## 2016-06-17 MED ORDER — MIDAZOLAM BOLUS VIA INFUSION
2.0000 mg | INTRAVENOUS | Status: DC | PRN
  Administered 2016-06-17: 1 mg via INTRAVENOUS
  Filled 2016-06-17: qty 2

## 2016-06-17 MED ORDER — LORAZEPAM 2 MG/ML IJ SOLN
0.5000 mg | Freq: Once | INTRAMUSCULAR | Status: AC
  Administered 2016-06-17: 0.5 mg via INTRAVENOUS
  Filled 2016-06-17: qty 1

## 2016-06-17 MED ORDER — GLYCOPYRROLATE 0.2 MG/ML IJ SOLN
0.4000 mg | Freq: Four times a day (QID) | INTRAMUSCULAR | Status: DC
  Administered 2016-06-17: 0.4 mg via INTRAVENOUS
  Filled 2016-06-17: qty 2

## 2016-06-17 MED ORDER — HYDROMORPHONE HCL PF 10 MG/ML IJ SOLN
1.0000 mg/h | INTRAMUSCULAR | Status: DC
  Administered 2016-06-17: 1 mg/h via INTRAVENOUS
  Administered 2016-06-17: 0.5 mg/h via INTRAVENOUS
  Filled 2016-06-17 (×2): qty 2.5

## 2016-06-17 MED ORDER — LORAZEPAM 2 MG/ML IJ SOLN
2.0000 mg | INTRAMUSCULAR | Status: DC | PRN
  Administered 2016-06-17: 2 mg via INTRAVENOUS
  Filled 2016-06-17: qty 1

## 2016-06-17 MED ORDER — LORAZEPAM 2 MG/ML IJ SOLN
1.0000 mg | INTRAMUSCULAR | Status: DC | PRN
  Administered 2016-06-17 (×3): 1 mg via INTRAVENOUS
  Filled 2016-06-17 (×3): qty 1

## 2016-06-17 MED ORDER — LEVALBUTEROL HCL 0.63 MG/3ML IN NEBU
INHALATION_SOLUTION | RESPIRATORY_TRACT | Status: AC
  Administered 2016-06-17: 03:00:00
  Filled 2016-06-17: qty 6

## 2016-06-17 MED ORDER — LORAZEPAM 2 MG/ML IJ SOLN: 1.0000 mg | INTRAMUSCULAR | Status: DC | PRN

## 2016-06-17 MED ORDER — LORAZEPAM 2 MG/ML PO CONC: 1.0000 mg | ORAL | Status: DC | PRN

## 2016-06-17 MED ORDER — HYDROMORPHONE HCL 1 MG/ML IJ SOLN
1.0000 mg | INTRAMUSCULAR | Status: DC | PRN
  Administered 2016-06-17: 1 mg via INTRAVENOUS

## 2016-06-17 MED ORDER — LORAZEPAM 2 MG/ML IJ SOLN: 1.0000 mg | Freq: Two times a day (BID) | INTRAMUSCULAR | Status: DC | PRN

## 2016-06-17 NOTE — Progress Notes (Signed)
Palliative Medicine RN Note: Saw pt to evaluate for symptoms uncontrolled by current regimen. Pt is GIP with [facility]; their Market researcher, Dr Adline Peals, is here to see pt as well. Per her discussion and meeting with family, plan is made for her to stay here as a GIP pt per family request. Dr Estill Cotta does not have privileges here, so PMT is assisting with management.  Discussed pt with Karolee Stamps, PMT PA. Obtained orders to start Versed drip and increase dilaudid drip, as pt remains agitated. Family refuses haloperidol, as stated reaction is pt became "stiff as a board."   Added PMT hours to MD notification orders. If patient needs adjustment to medication regimen, please call PMT between the hours of 0700-1900. Outside of these hours, please call attending, as PMT does not have 24 hour coverage.  Plan for f/u by PMT member tomorrow.  Marjie Skiff Gwenetta Devos, RN, BSN, [facility] 06/17/2016 2:03 PM Cell [phone] 8:00-4:00 Monday-Friday Office [phone]

## 2016-06-17 NOTE — Consult Note (Signed)
     [facility] CM Inpatient Consult   06/17/2016  Monique Fleming 08-09-1929 [id number]   Patient screened for [facility] services. Chart reviewed. Confirmed with inpatient RNCM that [name] is comfort care. Therefore, [facility] Care Management not appropriate.  Marthenia Rolling, MSN-Ed, RN,BSN [facility] Liaison [phone]

## 2016-06-17 NOTE — Progress Notes (Signed)
[facility]                                                                              Patient Demographics  Monique Fleming, is a 82 y.o. female, DOB - 1929-01-01, [id number]  Admit date - 06/26/2016   Admitting Physician Waldemar Dickens, MD  Outpatient Primary MD for the patient is Penni Homans, MD  Outpatient specialists:   LOS - 1  days    Chief Complaint  Patient presents with  . Respiratory Distress       Brief summary    Monique Fleming is a 82 y.o. female with medical history significant for known cirrhosis with portal hypertension, recent admission for acute GI bleeding secondary to portal gastropathy and colonic AVMs, recent diffuse pancolitis with negative stool studies (although index of suspicion for C. difficile lattice remained high at time of discharge and therefore was treated as such), hepatic encephalopathy, recent acute left cerebellar infarct, COPD, diabetes, and prior colon cancer with colectomy in 2008. Patient was discharged with hospice home care with plans for future treatments to be focused on comfort. Family had noticed since discharge patient with gradual increase in abdominal girth concerning for ascites so GI had planned for outpatient therapeutic paracentesis on the day of admission. About 3 days ago the patient was noticed to have increasing episodes of episodes of what the family described as "gasping for air". She was treated with increasing her oxygen to 4 L and given pain medications and antianxiety medication. These episodes became more frequent and now were occurring in the evening. The patient was subsequently brought to the hospital for further evaluation and treatment. Family did confirm that they wish for comfort focus only including no further therapeutic paracentesis.     Assessment & Plan   Acute on Chronic respiratory failure with hypoxia/Hepatic cirrhosis/End of life care - Patient's family requested for complete  comfort care and symptom management. No further workup or paracentesis at this time - Patient was initially placed on fentanyl infusion however, currently maxed out. Patient started on Dilaudid drip, increased Ativan.  - Requested palliative medicine for assistance with end-of-life symptoms - Expected hospital death   Code Status: DNR DVT Prophylaxis: not applicable Family Communication: Discussed in detail with the patient, all imaging results, lab results explained to the patient 's 2 daughters at the bed side    Disposition Plan: Expecting hospital death in next 24 hours Time Spent in minutes 25 minutes  Procedures:  None  Consultants:   Palliative medicine   Medications  Scheduled Meds: . feeding supplement (ENSURE ENLIVE)  237 mL Oral BID BM  . levalbuterol  1.25 mg Nebulization Once   Continuous Infusions: . HYDROmorphone     PRN Meds:.acetaminophen **OR** acetaminophen, antiseptic oral rinse, diphenhydrAMINE, glycopyrrolate **OR** glycopyrrolate **OR** glycopyrrolate, LORazepam, magic mouthwash, ondansetron **OR** ondansetron (ZOFRAN) IV, polyvinyl alcohol   Antibiotics   Anti-infectives    None        Subjective:   Monique Fleming was seen and examined today. Currently unresponsive, appeared comfortable at the time of my examination. Daughters at the bedside. Per daughter, overnight had episodes of shortness  of breath and struggling.    Objective:   Vitals:   07/03/2016 1045 06/13/2016 1226 07/02/2016 1400 06/17/16 0627  BP: (!) 103/52 (!) 84/37  115/71  Pulse: 83 76  82  Resp: 14 16 12 13   Temp:    97.9 F (36.6 C)  TempSrc:    Axillary  SpO2: 100% 100%  100%  Weight:      Height:        Intake/Output Summary (Last 24 hours) at 06/17/16 1325 Last data filed at 06/17/16 0516  Gross per 24 hour  Intake               90 ml  Output                0 ml  Net               90 ml     Wt Readings from Last 3 Encounters:  06/30/2016 60.8 kg (134 lb)    06/04/16 61.1 kg (134 lb 12.8 oz)  05/07/16 54.4 kg (120 lb)     Exam  General:Unresponsive  HEENT:   Neck:   Cardiovascular: S1 S2 auscultated RRR  Respiratory: Clear to auscultation bilaterally, no wheezing, rales or rhonchi  Gastrointestinal: Soft, nontender, distended, + bowel sounds  Ext: no cyanosis clubbing or edema  Neuro: unresponsive  Skin: No rashes  Psych: unresponsive   Data Reviewed:  I have personally reviewed following labs and imaging studies  Micro Results No results found for this or any previous visit (from the past 240 hour(s)).  Radiology Reports Dg Chest 2 View  Result Date: 05/26/2016 CLINICAL DATA:  Altered mental status. History of asthma and bronchitis. COPD. EXAM: CHEST  2 VIEW COMPARISON:  Chest radiograph 05/01/2016 FINDINGS: Shallow lung inflation with diffusely increased pulmonary interstitial markings, similar prior study. Cardiomediastinal contours are within normal limits when allowing for AP technique. No focal airspace consolidation or pulmonary edema. No pneumothorax or sizable pleural effusion. IMPRESSION: No radiographic evidence of pneumonia. Electronically Signed   By: Ulyses Jarred M.D.   On: 05/26/2016 22:22   Ct Head Wo Contrast  Result Date: 05/28/2016 CLINICAL DATA:  Lethargy EXAM: CT HEAD WITHOUT CONTRAST TECHNIQUE: Contiguous axial images were obtained from the base of the skull through the vertex without intravenous contrast. COMPARISON:  05/27/2016 FINDINGS: Brain: No evidence of acute infarction, hemorrhage, hydrocephalus, extra-axial collection or mass lesion/mass effect. Old lacunar infarcts in the bilateral cerebellum. Tiny lacunar infarct in the right external capsule. Vascular: No hyperdense vessel or unexpected calcification. Skull: No evidence of calvarial fracture. Sinuses/Orbits: The visualized paranasal sinuses are essentially clear. The mastoid air cells are unopacified. Other: Global cortical atrophy.  No  ventriculomegaly. Subcortical white matter and periventricular small vessel ischemic changes. Intracranial atherosclerosis. IMPRESSION: No evidence of acute intracranial abnormality. Atrophy with small vessel ischemic changes. Old bilateral cerebellar infarcts. Electronically Signed   By: Julian Hy M.D.   On: 05/28/2016 20:18   Ct Head Wo Contrast  Result Date: 05/27/2016 CLINICAL DATA:  82 y/o F; altered mental status with history of TIA and seizures. EXAM: CT HEAD WITHOUT CONTRAST TECHNIQUE: Contiguous axial images were obtained from the base of the skull through the vertex without intravenous contrast. COMPARISON:  None. FINDINGS: Brain: No evidence of acute infarction, hemorrhage, hydrocephalus, extra-axial collection or mass lesion/mass effect. Stable moderate parenchymal volume loss and chronic microvascular ischemic changes. Stable bilateral cerebellar and left occipital small chronic infarcts. Vascular: No hyperdense vessel. Extensive calcific atherosclerosis of cavernous  internal carotid arteries and vertebral arteries. Skull: Negative. Sinuses/Orbits: Bilateral intra-ocular lens replacement. No acute finding. Other: Bilateral high-riding jugular bulbs within the sigmoid plates. IMPRESSION: No acute intracranial abnormality. Stable parenchymal volume loss, chronic microvascular ischemic changes and small chronic infarcts. Electronically Signed   By: Kristine Garbe M.D.   On: 05/27/2016 01:17   Mr Brain Wo Contrast  Result Date: 05/29/2016 CLINICAL DATA:  Confusion EXAM: MRI HEAD WITHOUT CONTRAST TECHNIQUE: Multiplanar, multiecho pulse sequences of the brain and surrounding structures were obtained without intravenous contrast. COMPARISON:  CT 05/28/2016.  MRI 02/28/2016 FINDINGS: Incomplete study. The patient was confused and was not able to complete the study. Acute infarct in the left cerebellum measuring approximately 10 x 15 mm. No other acute infarct identified. Generalized  atrophy.  Negative for hydrocephalus. Chronic microvascular ischemic change in the white matter bilaterally. Chronic infarct in the right cerebellum. Chronic infarct in the left cerebellum. Multiple areas of chronic micro hemorrhage in the cerebral hemispheres bilaterally. Most of these are cortically based hemorrhage. There is also chronic micro hemorrhage in the pons and in the cerebellum bilaterally. IMPRESSION: Acute infarct left cerebellum Atrophy and chronic ischemia Multiple areas of chronic hemorrhage in the brain which may be due to poorly controlled hypertension. Patient not able to complete the study. Electronically Signed   By: Franchot Gallo M.D.   On: 05/29/2016 17:40   Ct Abdomen Pelvis W Contrast  Result Date: 05/27/2016 CLINICAL DATA:  Abdominal pain and diarrhea. EXAM: CT ABDOMEN AND PELVIS WITH CONTRAST TECHNIQUE: Multidetector CT imaging of the abdomen and pelvis was performed using the standard protocol following bolus administration of intravenous contrast. CONTRAST:  2mL ISOVUE-300 IOPAMIDOL (ISOVUE-300) INJECTION 61% COMPARISON:  08/21/2014 FINDINGS: Lower chest and abdominal wall: No acute finding. Emphysematous changes. Hepatobiliary: Cirrhotic liver without visible mass lesion. There is portal hypertension with recannulated umbilical vein, small ascites, splenomegaly, and severe hemorrhoids.Cholecystectomy with normal common bile duct diameter Pancreas: Unremarkable. Spleen: Enlarged in the setting of portal hypertension. Adrenals/Urinary Tract: Negative adrenals. Small bilateral hilar calcifications likely combination of vascular and urinary. Bilateral renal cysts which appear simple, measuring up to 7.3 cm exophytic from the left upper pole. Unremarkable bladder. Stomach/Bowel: Ileocolic anastomosis on the right. There is pan colonic circumferential thickening with intermittent mesenteric fat edema. No bowel obstruction or perforation. Severe internal hemorrhoids. High-density  structures within the stomach lumen are likely ingested. Reproductive:Incidental 2 cm simple appearing right ovarian cyst. Vascular/Lymphatic: Sequela of portal hypertension as described above. 31 mm fusiform abdominal aortic aneurysm, of unlikely clinical significance given patient's comorbidities. Extensive atherosclerotic calcification. No mass or adenopathy. Other: Small ascites. Musculoskeletal: No acute abnormalities. IMPRESSION: 1. Pancolitis, likely infectious.  Consider C difficile colitis. 2. Cirrhosis with portal hypotension. 3.  Emphysema. (ICD10-J43.9) 4. Aortic Atherosclerosis (ICD10-170.0) with 31 mm diameter abdominal aortic aneurysm. Electronically Signed   By: Monte Fantasia M.D.   On: 05/27/2016 01:22   US Paracentesis  Result Date: 06/03/2016 INDICATION: Cirrhosis. Abdominal distention secondary to ascites. Request therapeutic paracentesis. EXAM: ULTRASOUND GUIDED RIGHT LOWER QUADRANT PARACENTESIS MEDICATIONS: None. COMPLICATIONS: None immediate. PROCEDURE: Informed written consent was obtained from the patient after a discussion of the risks, benefits and alternatives to treatment. A timeout was performed prior to the initiation of the procedure. Initial ultrasound scanning demonstrates a large amount of ascites within the right lower abdominal quadrant. The right lower abdomen was prepped and draped in the usual sterile fashion. 1% lidocaine with epinephrine was used for local anesthesia. Following this, a Safe-T-Centesis catheter was  introduced. An ultrasound image was saved for documentation purposes. The paracentesis was performed. The catheter was removed and a dressing was applied. The patient tolerated the procedure well without immediate post procedural complication. FINDINGS: A total of approximately 1.5 L of hazy, pale yellow fluid was removed. IMPRESSION: Successful ultrasound-guided paracentesis yielding 1.5 liters of peritoneal fluid. Read by: Ascencion Dike PA-C Electronically  Signed   By: Sandi Mariscal M.D.   On: 06/03/2016 09:49   Dg Abd Portable 1v  Result Date: 05/28/2016 CLINICAL DATA:  Abdominal distension EXAM: PORTABLE ABDOMEN - 1 VIEW COMPARISON:  CT scan May 27, 2016 FINDINGS: No evidence of bowel obstruction. No acute abnormalities are seen on this single supine frontal view of the abdomen and pelvis. IMPRESSION: No cause for distention identified. Electronically Signed   By: Dorise Bullion III M.D   On: 05/28/2016 18:50    Lab Data:  CBC: No results for input(s): WBC, NEUTROABS, HGB, HCT, MCV, PLT in the last 168 hours. Basic Metabolic Panel: No results for input(s): NA, K, CL, CO2, GLUCOSE, BUN, CREATININE, CALCIUM, MG, PHOS in the last 168 hours. GFR: Estimated Creatinine Clearance: 44.6 mL/min (by C-G formula based on SCr of 0.81 mg/dL). Liver Function Tests: No results for input(s): AST, ALT, ALKPHOS, BILITOT, PROT, ALBUMIN in the last 168 hours. No results for input(s): LIPASE, AMYLASE in the last 168 hours. No results for input(s): AMMONIA in the last 168 hours. Coagulation Profile: No results for input(s): INR, PROTIME in the last 168 hours. Cardiac Enzymes: No results for input(s): CKTOTAL, CKMB, CKMBINDEX, TROPONINI in the last 168 hours. BNP (last 3 results) No results for input(s): PROBNP in the last 8760 hours. HbA1C: No results for input(s): HGBA1C in the last 72 hours. CBG: No results for input(s): GLUCAP in the last 168 hours. Lipid Profile: No results for input(s): CHOL, HDL, LDLCALC, TRIG, CHOLHDL, LDLDIRECT in the last 72 hours. Thyroid Function Tests: No results for input(s): TSH, T4TOTAL, FREET4, T3FREE, THYROIDAB in the last 72 hours. Anemia Panel: No results for input(s): VITAMINB12, FOLATE, FERRITIN, TIBC, IRON, RETICCTPCT in the last 72 hours. Urine analysis:    Component Value Date/Time   COLORURINE YELLOW 05/26/2016 [place] 05/26/2016 2245   LABSPEC 1.024 05/26/2016 2245   PHURINE 5.5  05/26/2016 2245   GLUCOSEU NEGATIVE 05/26/2016 2245   GLUCOSEU NEGATIVE 08/13/2011 0846   HGBUR NEGATIVE 05/26/2016 2245   HGBUR negative 02/04/2009 1309   BILIRUBINUR NEGATIVE 05/26/2016 2245   BILIRUBINUR neg 05/01/2016 1110   KETONESUR NEGATIVE 05/26/2016 2245   PROTEINUR NEGATIVE 05/26/2016 2245   UROBILINOGEN 0.2 05/01/2016 1110   UROBILINOGEN 1.0 05/25/2015 1757   NITRITE NEGATIVE 05/26/2016 2245   LEUKOCYTESUR NEGATIVE 05/26/2016 2245     RAI,RIPUDEEP M.D. [facility] 06/17/2016, 1:25 PM  Pager: [phone] Between 7am to 7pm - call Pager - [phone]  After 7pm go to [email or web address] - password [email or web address]  Call night coverage person covering after 7pm

## 2016-06-17 NOTE — Progress Notes (Signed)
Wasted 220 fentanyl Bernell List witnessed

## 2016-06-17 NOTE — Progress Notes (Signed)
Nutrition Brief Note  Chart reviewed. Pt now transitioning to comfort care.  No further nutrition interventions warranted at this time.  Please re-consult as needed.   Devyn Griffing A. Maude Gloor, RD, LDN, CDE Pager: [phone] After hours Pager: [phone]  

## 2016-06-17 NOTE — Progress Notes (Signed)
Fentanyl wasted with RN Ludwig Clarks.

## 2016-06-17 NOTE — Telephone Encounter (Signed)
Plan of care from Hospice received, completed by PCP and mailed back to Hospice in envelope provided. Copies of forms sent for scanning.

## 2016-06-18 MED ORDER — HYDROMORPHONE HCL PF 10 MG/ML IJ SOLN
1.0000 mg/h | INTRAMUSCULAR | Status: DC
  Administered 2016-06-18: 1 mg/h via INTRAVENOUS
  Filled 2016-06-18: qty 5

## 2016-06-18 MED ORDER — HYDROMORPHONE HCL PF 10 MG/ML IJ SOLN
1.0000 mg/h | INTRAMUSCULAR | Status: DC
  Filled 2016-06-18: qty 2.5

## 2016-06-18 MED ORDER — WHITE PETROLATUM GEL
Status: AC
  Administered 2016-06-18: 10:00:00
  Filled 2016-06-18: qty 1

## 2016-06-24 NOTE — Telephone Encounter (Signed)
Received fax. Completed signature and date. Sent for scanning. 

## 2016-06-25 ENCOUNTER — Ambulatory Visit: Payer: Medicare Other | Admitting: Internal Medicine

## 2016-07-05 NOTE — Progress Notes (Signed)
Dilaudid drip 114ml wasted in sink and versed drip 23ml wasted in sink with Suezanne Jacquet, Therapist, sports.

## 2016-07-05 NOTE — Discharge Summary (Signed)
Expiration Note/ Death Summary  [name]  MR#: [id number]  DOB:[birth year]  Date of Admission: July 16, 2016 Date of Death: 07/18/16  Attending 55  Patient's PCP: Penni Homans, MD  Consults:   Palliative medicine  Cause of Death: Acute on chronic respiratory failure with hypoxia End-stage liver disease  Secondary Diagnoses  . Chronic respiratory failure with hypoxia ([place]) . Advanced Hepatic cirrhosis ([facility])   Hepatic encephalopathy   End-stage liver disease   Portal hypertension   Ascites   Recent GI bleed   Pan colitis   History of colon cancer  Recent CVA  diabetes mellitus   COPD    Brief H and P: For complete details please refer to admission H and P, but in brief  Monique B Adamsis a 82 y.o.femalewith medical history significant for known cirrhosis with portal hypertension, recent admission for acute GI bleeding secondary to portal gastropathy and colonic AVMs, recent diffuse pancolitis with negative stool studies (although index of suspicion for C. difficile lattice remained high at time of discharge and therefore was treated as such), hepatic encephalopathy, recent acute left cerebellar infarct, COPD, diabetes, and prior colon cancer with colectomy in 2008. Patient was discharged with hospice home care with plans for future treatments to be focused on comfort. Family had noticed since discharge patient with gradual increase in abdominal girth concerning for ascites so GI had planned for outpatient therapeutic paracentesis on the day of admission. About 3 days ago the patient was noticed to have increasing episodes of episodes of what the family described as "gasping for air". She was treated with increasing her oxygen to 4 L and given pain medications and antianxiety medication. These episodes became more frequent and now were occurring in the evening. The patient was subsequently brought to the hospital for further evaluation and treatment.  Family did confirm that they wish for comfort focus only including no further therapeutic paracentesis  Hospital Course: Acute on Chronic respiratory failure with hypoxia/end-stage liver disease with Hepatic cirrhosis, portal hypertension, ascites, hepatic encephalopathy - Patient's family requested for complete comfort care and symptom management at the time of admission. They requested no further workup or paracentesis. - Patient was initially placed on fentanyl infusion, subsequently on terminal sedation with Versed and Dilaudid bolus prn as recommended by palliative medicine and patient's hospice physician. Palliative medicine was consulted for assistance with end-of-life care orders.  -Patient was at hospice at home prior to admission. She passed on Jul 19, 2023 at 11:50 AM with her family at the bedside.   SignedEstill Cotta M.D. [facility] July 18, 2016, 12:55 PM Pager: [phone]

## 2016-07-05 NOTE — Progress Notes (Signed)
Patient transferred to funeral home who came to pick up at bedside

## 2016-07-05 NOTE — Progress Notes (Signed)
Palliative Medicine RN Note: This is an active, covered hospice admission for [facility].  Pt is relaxed, open mouth breathing with shallow respirations and short periods of apnea. She is very hot to the touch. +4 pitting edema bilateral feet. Skin is pale. Family is at bedside. Discussed changes, central fevers, s/s imminent demise, medications. Order obtained to place foley if they desire, but her UOP has been minimal, so they deferred decision for now. Pt has orders to titrate her hydromorphone and midazolam prn, and she has only needed about 2-3 breakthrough doses of each since yesterday. Family also declined prn tylenol, as they agree that she is not suffering d/t temperature. They have removed blankets and left her covered with a thin sheet. Niece is ICU RN and remains at bedside.  Pt's nephew is on the way and will arrive in about an hour. Discussed O2 as potentially prolonging her death; d/c O2 to allow natural death. Plan f/u by PMT later today to ensure comfort. Family has contact information for PMT.  Marjie Skiff Vester Balthazor, RN, BSN, [facility] 07-14-16 9:51 AM Cell [phone] 8:00-4:00 Monday-Friday Office [phone]

## 2016-07-05 NOTE — Progress Notes (Signed)
Family at bedside called RN because "patient has passed".   Davina Poke, RN at bedside for assessment. Patient has absent respirations and heart beat. Dr. Tana Coast paged to make aware that patient has expired. Family consoled and grieving at bedside. Family stated they would like some time with that patient.   Time of death 11:50 am

## 2016-07-05 DEATH — deceased

## 2016-07-14 ENCOUNTER — Other Ambulatory Visit: Payer: Self-pay | Admitting: Family Medicine

## 2016-08-30 ENCOUNTER — Other Ambulatory Visit (HOSPITAL_COMMUNITY): Payer: Self-pay | Admitting: Psychiatry

## 2016-09-11 ENCOUNTER — Other Ambulatory Visit: Payer: Self-pay | Admitting: Nurse Practitioner

## 2017-08-13 IMAGING — CT CT HEAD W/O CM
3 of 4 series · 18 of 47 positions shown, 21 images · non-contrast
Comparison: 05/27/2016

CLINICAL DATA: Lethargy

EXAM:
CT HEAD WITHOUT CONTRAST
TECHNIQUE: Contiguous axial images were obtained from the base of the skull
through the vertex without intravenous contrast.

[Series 201: head w/o, idose (1) · axial · non-contrast · 0.42mm/px · z∈[+66,+186]mm · 12 of 30 slices shown, 15 images]
[im 3/30  brain]
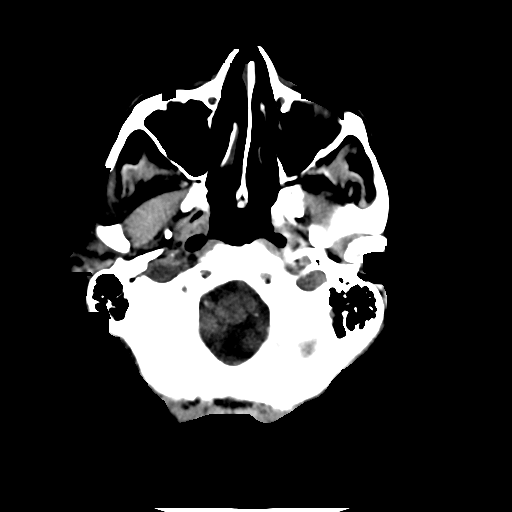
[im 3/30  bone]
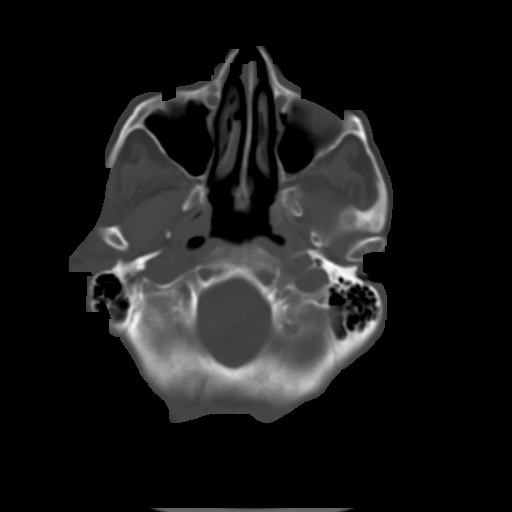
[im 5/30  brain]
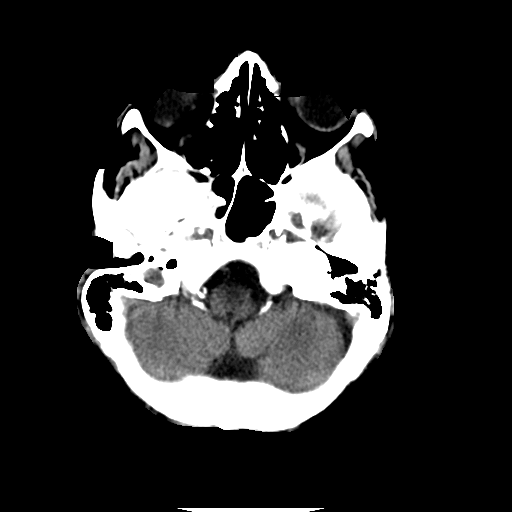
[im 7/30  brain]
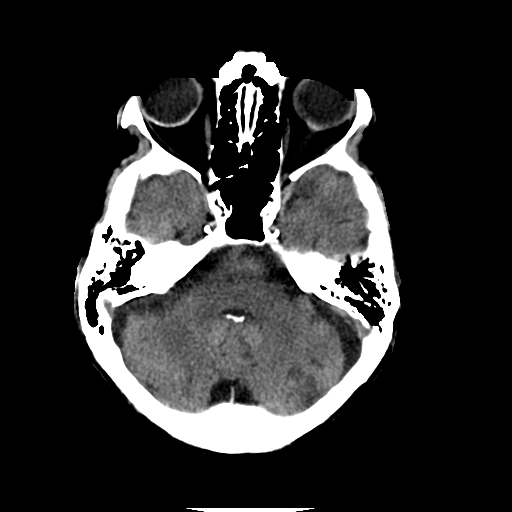
[im 9/30  brain]
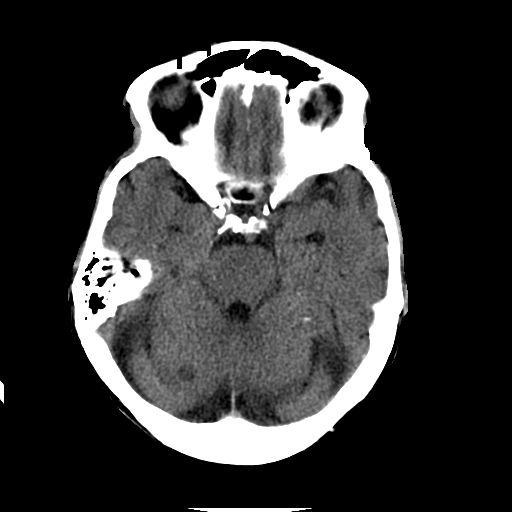
[im 11/30  brain]
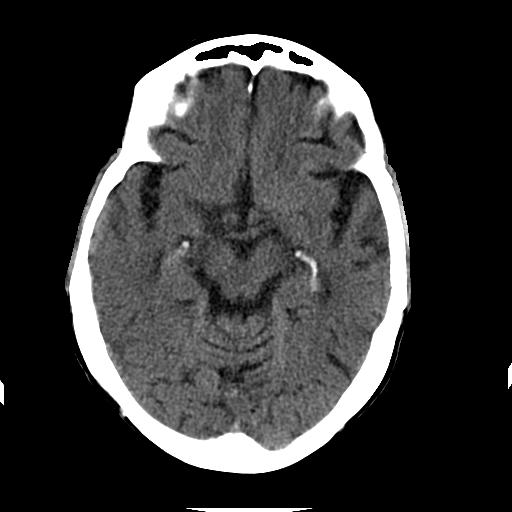
[im 11/30  bone]
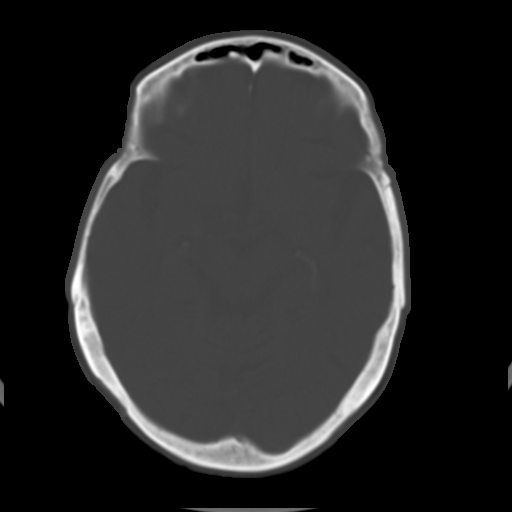
[im 13/30  brain]
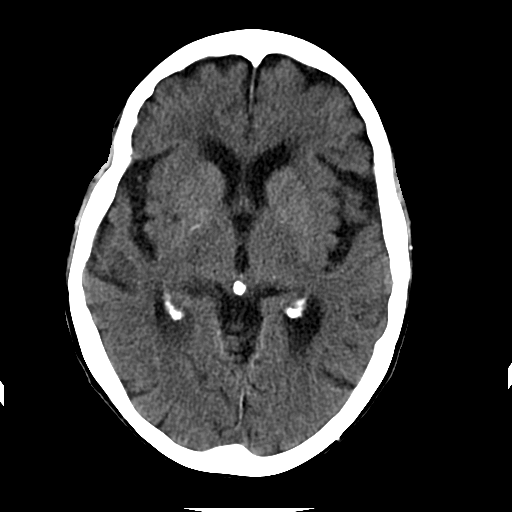
[im 17/30  brain]
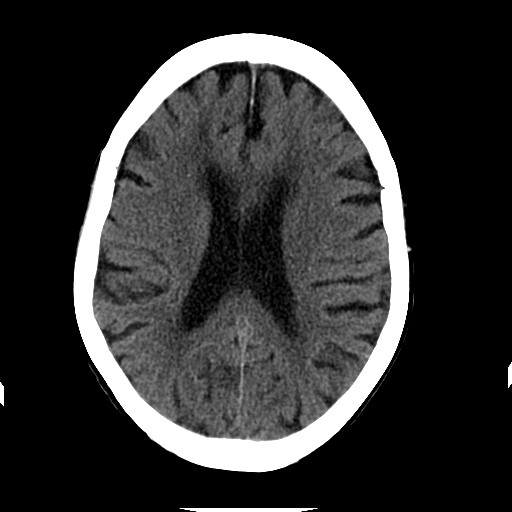
[im 19/30  brain]
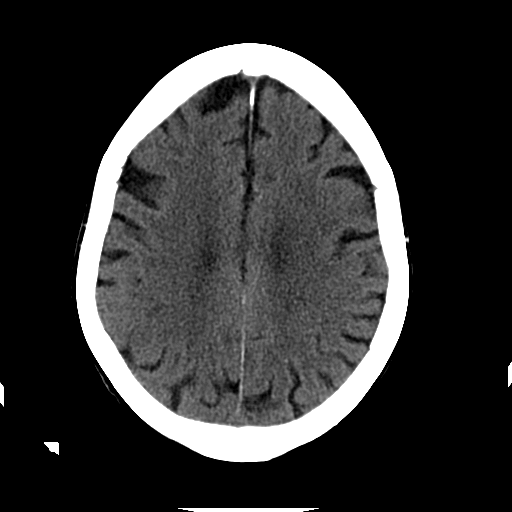
[im 21/30  brain]
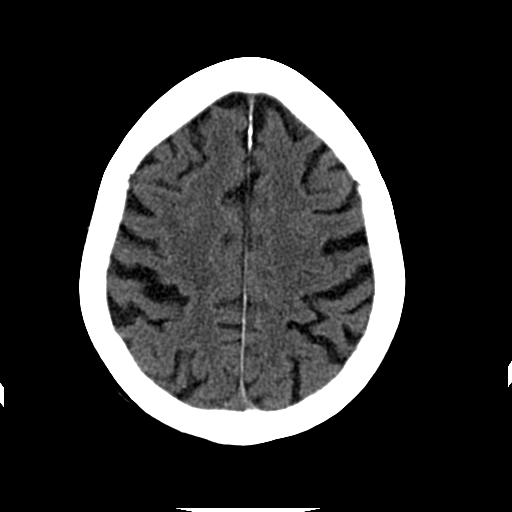
[im 21/30  bone]
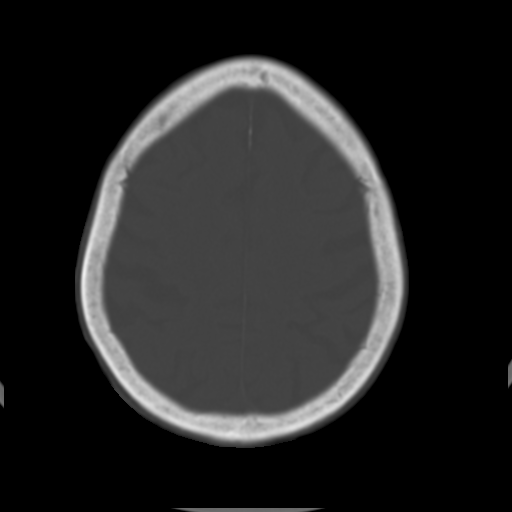
[im 23/30  brain]
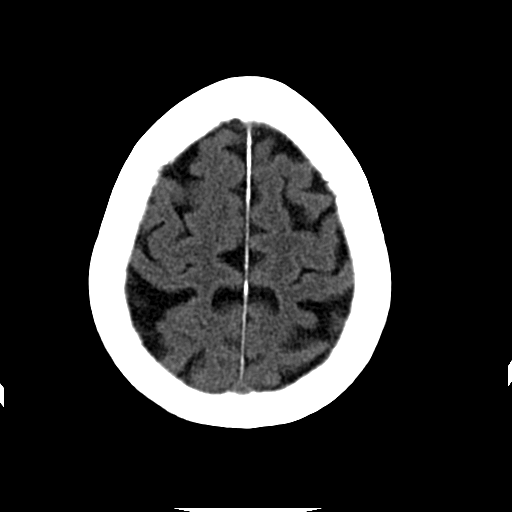
[im 25/30  brain]
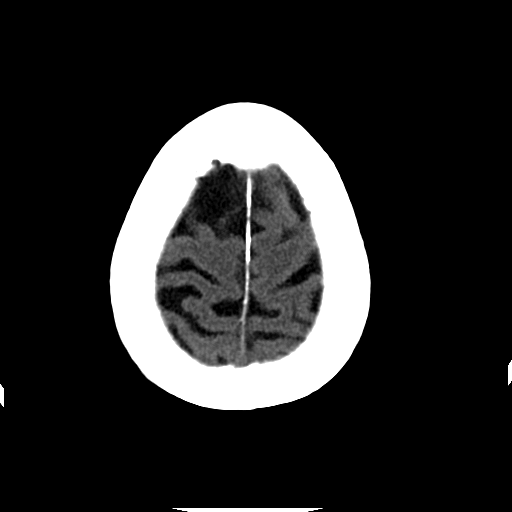
[im 27/30  brain]
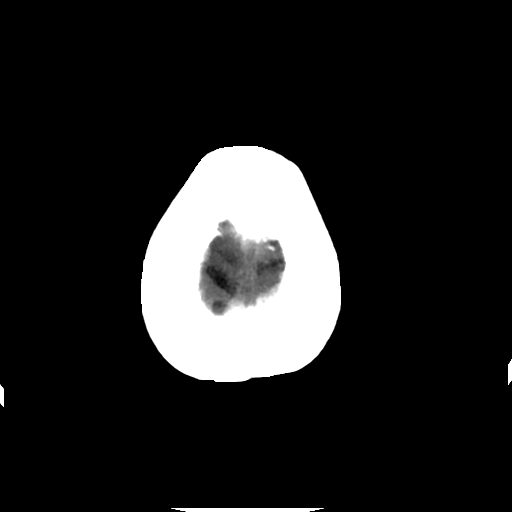

[Series 203: coronal st, idose (1) · coronal · 0.40mm/px · 3 of 67 slices shown]
[im 23/67  brain]
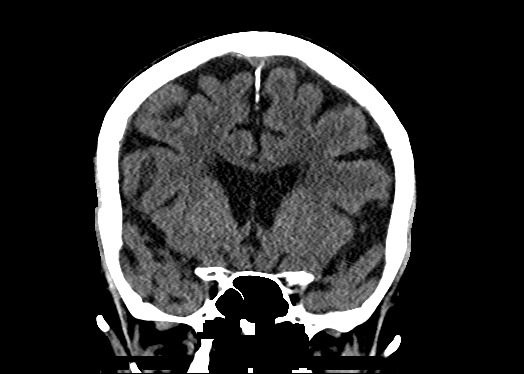
[im 30/67  brain]
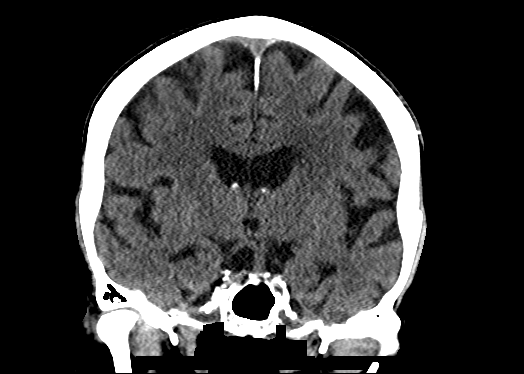
[im 37/67  brain]
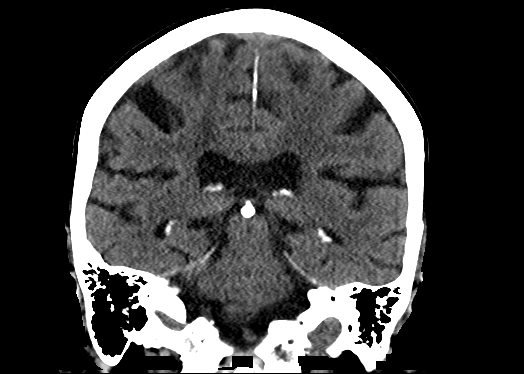

[Series 204: sagittal st, idose (1) · sagittal · 0.40mm/px · 3 of 70 slices shown]
[im 24/70  brain]
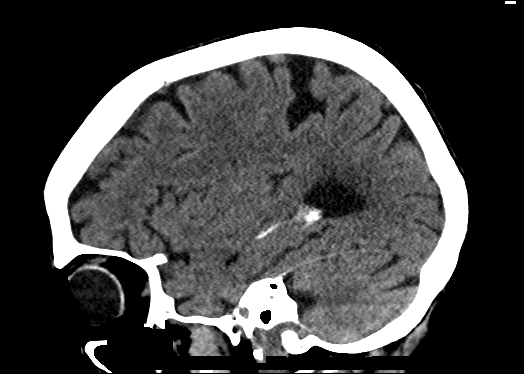
[im 35/70  brain]
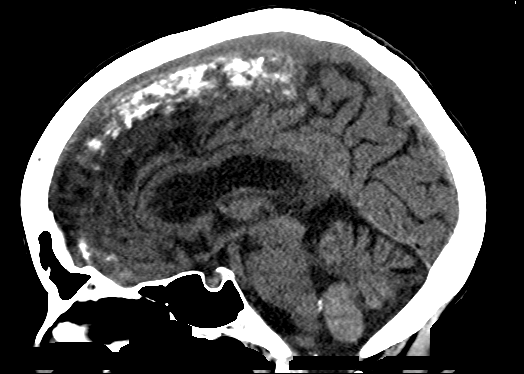
[im 47/70  brain]
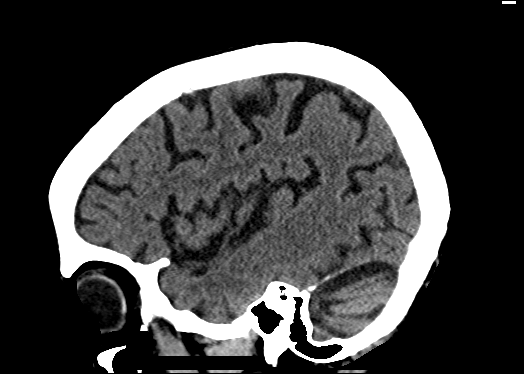

[18 of 47 positions shown; findings below may reference images not displayed]

FINDINGS: Brain: No evidence of acute infarction, hemorrhage, hydrocephalus,
extra-axial collection or mass lesion/mass effect.

Old lacunar infarcts in the bilateral cerebellum. Tiny lacunar
infarct in the right external capsule.

Vascular: No hyperdense vessel or unexpected calcification.

Skull: No evidence of calvarial fracture.

Sinuses/Orbits: The visualized paranasal sinuses are essentially
clear. The mastoid air cells are unopacified.

Other: Global cortical atrophy.  No ventriculomegaly.

Subcortical white matter and periventricular small vessel ischemic
changes. Intracranial atherosclerosis.
IMPRESSION: No evidence of acute intracranial abnormality.

Atrophy with small vessel ischemic changes.

Old bilateral cerebellar infarcts.
